# Patient Record
Sex: Female | Born: 1990
Health system: Southern US, Community
[De-identification: ages and names within clinical notes are randomized; demographics above are authoritative.]

## PROBLEM LIST (undated history)

## (undated) ENCOUNTER — Inpatient Hospital Stay (HOSPITAL_COMMUNITY): Payer: Self-pay

## (undated) DIAGNOSIS — E119 Type 2 diabetes mellitus without complications: Secondary | ICD-10-CM

## (undated) DIAGNOSIS — J45909 Unspecified asthma, uncomplicated: Secondary | ICD-10-CM

## (undated) DIAGNOSIS — Z8759 Personal history of other complications of pregnancy, childbirth and the puerperium: Secondary | ICD-10-CM

## (undated) DIAGNOSIS — N390 Urinary tract infection, site not specified: Secondary | ICD-10-CM

## (undated) DIAGNOSIS — L309 Dermatitis, unspecified: Secondary | ICD-10-CM

## (undated) DIAGNOSIS — T7840XA Allergy, unspecified, initial encounter: Secondary | ICD-10-CM

## (undated) DIAGNOSIS — D649 Anemia, unspecified: Secondary | ICD-10-CM

## (undated) DIAGNOSIS — I10 Essential (primary) hypertension: Secondary | ICD-10-CM

## (undated) HISTORY — DX: Allergy, unspecified, initial encounter: T78.40XA

## (undated) HISTORY — PX: NO PAST SURGERIES: SHX2092

## (undated) HISTORY — DX: Essential (primary) hypertension: I10

## (undated) HISTORY — DX: Personal history of other complications of pregnancy, childbirth and the puerperium: Z87.59

## (undated) HISTORY — DX: Unspecified asthma, uncomplicated: J45.909

## (undated) HISTORY — PX: OTHER SURGICAL HISTORY: SHX169

## (undated) HISTORY — DX: Anemia, unspecified: D64.9

## (undated) HISTORY — DX: Type 2 diabetes mellitus without complications: E11.9

---
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
## 2010-08-11 ENCOUNTER — Ambulatory Visit: Payer: Self-pay | Admitting: Gynecology

## 2010-08-18 ENCOUNTER — Ambulatory Visit: Payer: Self-pay | Admitting: Gynecology

## 2013-09-03 DIAGNOSIS — O10919 Unspecified pre-existing hypertension complicating pregnancy, unspecified trimester: Secondary | ICD-10-CM | POA: Diagnosis present

## 2013-09-03 DIAGNOSIS — I10 Essential (primary) hypertension: Secondary | ICD-10-CM | POA: Diagnosis present

## 2013-09-03 DIAGNOSIS — E119 Type 2 diabetes mellitus without complications: Secondary | ICD-10-CM

## 2013-09-03 DIAGNOSIS — D649 Anemia, unspecified: Secondary | ICD-10-CM

## 2013-09-03 HISTORY — DX: Anemia, unspecified: D64.9

## 2013-09-03 HISTORY — DX: Essential (primary) hypertension: I10

## 2013-09-03 HISTORY — DX: Type 2 diabetes mellitus without complications: E11.9

## 2013-11-06 ENCOUNTER — Ambulatory Visit (INDEPENDENT_AMBULATORY_CARE_PROVIDER_SITE_OTHER): Payer: No Typology Code available for payment source | Admitting: Medical

## 2013-11-06 ENCOUNTER — Encounter: Payer: Self-pay | Admitting: Medical

## 2013-11-06 VITALS — BP 148/98 | HR 64 | Temp 98.2°F | Resp 16 | Ht 64.0 in | Wt 212.0 lb

## 2013-11-06 DIAGNOSIS — I10 Essential (primary) hypertension: Secondary | ICD-10-CM

## 2013-11-06 DIAGNOSIS — R0989 Other specified symptoms and signs involving the circulatory and respiratory systems: Secondary | ICD-10-CM

## 2013-11-06 DIAGNOSIS — E669 Obesity, unspecified: Secondary | ICD-10-CM

## 2013-11-06 DIAGNOSIS — R0683 Snoring: Secondary | ICD-10-CM

## 2013-11-06 DIAGNOSIS — R0609 Other forms of dyspnea: Secondary | ICD-10-CM

## 2013-11-06 DIAGNOSIS — Z8249 Family history of ischemic heart disease and other diseases of the circulatory system: Secondary | ICD-10-CM

## 2013-11-06 LAB — LIPID PANEL
CHOL/HDL RATIO: 2.4 ratio
Cholesterol: 150 mg/dL (ref 0–200)
HDL: 62 mg/dL (ref >39)
LDL CALC: 79 mg/dL (ref 0–99)
TRIGLYCERIDES: 43 mg/dL (ref <150)
VLDL: 9 mg/dL (ref 0–40)

## 2013-11-06 LAB — CBC
HEMATOCRIT: 32.1 % — AB (ref 36.0–46.0)
Hemoglobin: 10.1 g/dL — ABNORMAL LOW (ref 12.0–15.0)
MCH: 27.2 pg (ref 26.0–34.0)
MCHC: 31.5 g/dL (ref 30.0–36.0)
MCV: 86.5 fL (ref 78.0–100.0)
PLATELETS: 374 10*3/uL (ref 150–400)
RBC: 3.71 MIL/uL — ABNORMAL LOW (ref 3.87–5.11)
RDW: 13.9 % (ref 11.5–15.5)
WBC: 3.7 10*3/uL — AB (ref 4.0–10.5)

## 2013-11-06 LAB — COMPREHENSIVE METABOLIC PANEL
ALK PHOS: 73 U/L (ref 39–117)
ALT: 17 U/L (ref 0–35)
AST: 18 U/L (ref 0–37)
Albumin: 4.4 g/dL (ref 3.5–5.2)
BILIRUBIN TOTAL: 0.4 mg/dL (ref 0.2–1.2)
BUN: 8 mg/dL (ref 6–23)
CO2: 25 meq/L (ref 19–32)
CREATININE: 0.77 mg/dL (ref 0.50–1.10)
Calcium: 9.1 mg/dL (ref 8.4–10.5)
Chloride: 101 mEq/L (ref 96–112)
Glucose, Bld: 210 mg/dL — ABNORMAL HIGH (ref 70–99)
Potassium: 4.3 mEq/L (ref 3.5–5.3)
SODIUM: 135 meq/L (ref 135–145)
TOTAL PROTEIN: 7 g/dL (ref 6.0–8.3)

## 2013-11-06 NOTE — Progress Notes (Signed)
   Subjective:   Misty [name] is a 23 y.o. female presenting on 11/06/2013 with Hypertension  New patient today.  Here for concern for high blood pressure.  Had been told even in teenage years that her pressure was high.  Was diagnosed by urgent care formally 29mo ago, but not started on medication.   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    Cooks most of her food at home, but does eat some fast food.  She uses neighbor's BP cuff.  Has checked her BP a few times and it has been high.   Highest she has seen 140/60.  She notes pains in her feet at times.   At night gets cramps or spasm in back of legs.  She snores, but no witnessed apena, no waking unrested or day time sleepiness.  No chest pain, no SOB, no leg swelling.   No syncope.   No other aggravating or relieving factors.  No other complaint.  Review of Systems ROS as in subjective      Objective:     Filed Vitals:   11/06/13 0853  BP: 148/98  Pulse: 64  Temp: 98.2 F (36.8 C)  Resp: 16    General appearance: alert, no distress, WD/WN, overweight AA female Neck: supple, no lymphadenopathy, no thyromegaly, no masses, no bruits Heart: RRR, normal S1, S2, no murmurs Lungs: CTA bilaterally, no wheezes, rhonchi, or rales Abdomen: +bs, soft, non tender, non distended, no masses, no hepatomegaly, no splenomegaly Pulses: 2+ symmetric, upper and lower extremities, normal cap refill Ext: no edema, nontender, negative homans MSK: legs nontender     Assessment: Encounter Diagnoses  Name Primary?  . Essential hypertension, benign Yes  . Obesity, unspecified   . Family history of heart disease   . Snoring      Plan: We discussed her prior elevator readings in diagnosis of bipolar pressure particularly at this age.  We will start with some lab tests to further evaluate her high blood pressure, consider sleep study going forward.  Recommendations as below:   Begin working on diet and exercise changes to lose  weight as this will help your blood pressure  Be careful with salt intake and eating out  prepare more of your meals at home  We will call Monday with lab results  Check your pharmacy over the weekend as I will likely send a medication for you to start for high blood pressure  Misty [name] was seen today for hypertension.  Diagnoses and associated orders for this visit:  Essential hypertension, benign - TSH - Lipid panel - Comprehensive metabolic panel - CBC  Obesity, unspecified - TSH - Lipid panel - Comprehensive metabolic panel - CBC  Family history of heart disease - TSH - Lipid panel - Comprehensive metabolic panel - CBC  Snoring - TSH - Lipid panel - Comprehensive metabolic panel - CBC     Return pending labs.

## 2013-11-06 NOTE — Patient Instructions (Signed)
Thank you for giving me the opportunity to serve you today.    Your diagnosis today includes: Encounter Diagnoses  Name Primary?  . Essential hypertension, benign Yes  . Obesity, unspecified   . Family history of heart disease   . Snoring      Specific recommendations today include:  Begin working on diet and exercise changes to lose weight as this will help your blood pressure  Be careful with salt intake and eating out  prepare more of your meals at home  We will call Monday with lab results  Check your pharmacy over the weekend as I will likely send a medication for you to start for high blood pressure  Follow up: pending labs   I have included other useful information below for your review.  Hypertension As your heart beats, it forces blood through your arteries. This force is your blood pressure. If the pressure is too high, it is called hypertension (HTN) or high blood pressure. HTN is dangerous because you may have it and not know it. High blood pressure may mean that your heart has to work harder to pump blood. Your arteries may be narrow or stiff. The extra work puts you at risk for heart disease, stroke, and other problems.  Blood pressure consists of two numbers, a higher number over a lower, 110/72, for example. It is stated as "110 over 72." The ideal is below 120 for the top number (systolic) and under 80 for the bottom (diastolic). Write down your blood pressure today. You should pay close attention to your blood pressure if you have certain conditions such as:  Heart failure.  Prior heart attack.  Diabetes  Chronic kidney disease.  Prior stroke.  Multiple risk factors for heart disease. To see if you have HTN, your blood pressure should be measured while you are seated with your arm held at the level of the heart. It should be measured at least twice. A one-time elevated blood pressure reading (especially in the Emergency Department) does not mean that  you need treatment. There may be conditions in which the blood pressure is different between your right and left arms. It is important to see your caregiver soon for a recheck. Most people have essential hypertension which means that there is not a specific cause. This type of high blood pressure may be lowered by changing lifestyle factors such as:  Stress.  Smoking.  Lack of exercise.  Excessive weight.  Drug/tobacco/alcohol use.  Eating less salt. Most people do not have symptoms from high blood pressure until it has caused damage to the body. Effective treatment can often prevent, delay or reduce that damage. TREATMENT  When a cause has been identified, treatment for high blood pressure is directed at the cause. There are a large number of medications to treat HTN. These fall into several categories, and your caregiver will help you select the medicines that are best for you. Medications may have side effects. You should review side effects with your caregiver. If your blood pressure stays high after you have made lifestyle changes or started on medicines,   Your medication(s) may need to be changed.  Other problems may need to be addressed.  Be certain you understand your prescriptions, and know how and when to take your medicine.  Be sure to follow up with your caregiver within the time frame advised (usually within two weeks) to have your blood pressure rechecked and to review your medications.  If you are taking  more than one medicine to lower your blood pressure, make sure you know how and at what times they should be taken. Taking two medicines at the same time can result in blood pressure that is too low. SEEK IMMEDIATE MEDICAL CARE IF:  You develop a severe headache, blurred or changing vision, or confusion.  You have unusual weakness or numbness, or a faint feeling.  You have severe chest or abdominal pain, vomiting, or breathing problems. MAKE SURE YOU:   Understand  these instructions.  Will watch your condition.  Will get help right away if you are not doing well or get worse. Document Released: 08/20/2005 Document Revised: 11/12/2011 Document Reviewed: 04/09/2008 [facility]Exit[facility] Patient Information 2014 [employer], [place].

## 2013-11-07 LAB — TSH: TSH: 2.28 u[IU]/mL (ref 0.350–4.500)

## 2013-11-09 ENCOUNTER — Telehealth: Payer: Self-pay | Admitting: Medical

## 2013-11-09 ENCOUNTER — Other Ambulatory Visit: Payer: Self-pay | Admitting: Medical

## 2013-11-09 MED ORDER — HYDROCHLOROTHIAZIDE 25 MG PO TABS: 25.0000 mg | ORAL_TABLET | Freq: Every day | ORAL | Status: DC

## 2013-11-09 NOTE — Telephone Encounter (Signed)
Pt was notified of results

## 2013-11-30 ENCOUNTER — Encounter: Payer: Self-pay | Admitting: Medical

## 2013-11-30 ENCOUNTER — Other Ambulatory Visit: Payer: Self-pay | Admitting: Medical

## 2013-11-30 ENCOUNTER — Ambulatory Visit (INDEPENDENT_AMBULATORY_CARE_PROVIDER_SITE_OTHER): Payer: No Typology Code available for payment source | Admitting: Medical

## 2013-11-30 VITALS — BP 138/78 | HR 72 | Temp 98.7°F | Resp 14 | Wt 208.0 lb

## 2013-11-30 DIAGNOSIS — I10 Essential (primary) hypertension: Secondary | ICD-10-CM

## 2013-11-30 DIAGNOSIS — R7301 Impaired fasting glucose: Secondary | ICD-10-CM

## 2013-11-30 DIAGNOSIS — Z23 Encounter for immunization: Secondary | ICD-10-CM

## 2013-11-30 DIAGNOSIS — D649 Anemia, unspecified: Secondary | ICD-10-CM

## 2013-11-30 LAB — HEMOGLOBIN A1C
Hgb A1c MFr Bld: 8.9 % — ABNORMAL HIGH (ref <5.7)
MEAN PLASMA GLUCOSE: 209 mg/dL — AB (ref <117)

## 2013-11-30 LAB — CBC WITH DIFFERENTIAL/PLATELET
Basophils Absolute: 0 10*3/uL (ref 0.0–0.1)
Basophils Relative: 0 % (ref 0–1)
Eosinophils Absolute: 0.2 10*3/uL (ref 0.0–0.7)
Eosinophils Relative: 2 % (ref 0–5)
HCT: 34.1 % — ABNORMAL LOW (ref 36.0–46.0)
Hemoglobin: 11.1 g/dL — ABNORMAL LOW (ref 12.0–15.0)
Lymphocytes Relative: 36 % (ref 12–46)
Lymphs Abs: 2.7 10*3/uL (ref 0.7–4.0)
MCH: 27.7 pg (ref 26.0–34.0)
MCHC: 32.6 g/dL (ref 30.0–36.0)
MCV: 85 fL (ref 78.0–100.0)
Monocytes Absolute: 0.5 10*3/uL (ref 0.1–1.0)
Monocytes Relative: 7 % (ref 3–12)
Neutro Abs: 4.2 10*3/uL (ref 1.7–7.7)
Neutrophils Relative %: 55 % (ref 43–77)
Platelets: 308 10*3/uL (ref 150–400)
RBC: 4.01 MIL/uL (ref 3.87–5.11)
RDW: 16.3 % — ABNORMAL HIGH (ref 11.5–15.5)
WBC: 7.6 10*3/uL (ref 4.0–10.5)

## 2013-11-30 LAB — BASIC METABOLIC PANEL
BUN: 8 mg/dL (ref 6–23)
CALCIUM: 9.1 mg/dL (ref 8.4–10.5)
CHLORIDE: 103 meq/L (ref 96–112)
CO2: 26 meq/L (ref 19–32)
CREATININE: 0.86 mg/dL (ref 0.50–1.10)
Glucose, Bld: 219 mg/dL — ABNORMAL HIGH (ref 70–99)
Potassium: 4 mEq/L (ref 3.5–5.3)
Sodium: 134 mEq/L — ABNORMAL LOW (ref 135–145)

## 2013-11-30 LAB — POCT URINALYSIS DIPSTICK
BILIRUBIN UA: NEGATIVE
GLUCOSE UA: 100
Ketones, UA: NEGATIVE
LEUKOCYTES UA: NEGATIVE
NITRITE UA: NEGATIVE
PH UA: 5
Protein, UA: NEGATIVE
Spec Grav, UA: 1.02
Urobilinogen, UA: NEGATIVE

## 2013-11-30 LAB — RETICULOCYTES
ABS Retic: 48.1 10*3/uL (ref 19.0–186.0)
RBC.: 4.01 MIL/uL (ref 3.87–5.11)
RETIC CT PCT: 1.2 % (ref 0.4–2.3)

## 2013-11-30 LAB — IBC PANEL
%SAT: 8 % — ABNORMAL LOW (ref 20–55)
TIBC: 409 ug/dL (ref 250–470)
UIBC: 376 ug/dL (ref 125–400)

## 2013-11-30 LAB — IRON: IRON: 33 ug/dL — AB (ref 42–145)

## 2013-11-30 MED ORDER — HYDROCHLOROTHIAZIDE 25 MG PO TABS: 25.0000 mg | ORAL_TABLET | Freq: Every day | ORAL | Status: DC

## 2013-11-30 NOTE — Addendum Note (Signed)
Addended by: Janeice RobinsonSCALES, Kailena Lubas L on: 11/30/2013 04:15 PM   Modules accepted: Orders

## 2013-11-30 NOTE — Progress Notes (Signed)
   Subjective:   Misty [name] is a 23 y.o. female presenting on 11/30/2013 with discuss BP, anemia and glucose  I saw her recently as a new patient, we started blood pressure medicine and did labs. She is here to followup on abnormal labs . She has started the HCTZ without complaint.  Is planning to start working out at the gym with her friends.  She denies any history of anemia, no current bleeding, periods are regular and not heavy, no obvious hemorrhoids, and does not feel fatigued  She does note mother and grandmother have diabetes. She was fasting last visit and her glucose came back at 200. Her last meal today was about 2 hours and 45 minutes ago. She denies polydipsia, polyuria, blurred vision or other diabetic symptoms.  No other complaint.  Review of Systems ROS as in subjective      Objective:     Filed Vitals:   11/30/13 1511  BP: 138/78  Pulse: 72  Temp: 98.7 F (37.1 C)  Resp: 14    General appearance: alert, no distress, WD/WN Neck: supple, no lymphadenopathy, no thyromegaly, no masses Heart: RRR, normal S1, S2, no murmurs Lungs: CTA bilaterally, no wheezes, rhonchi, or rales Pulses: 2+ symmetric, upper and lower extremities, normal cap refill Ext: no edema      Assessment: Encounter Diagnoses  Name Primary?  Marland Kitchen. Anemia Yes  . Essential hypertension, benign   . Impaired fasting blood sugar   . Need for influenza vaccination   . Need for prophylactic vaccination and inoculation against influenza      Plan: We discussed her recent abnormal lab findings. Additional labs today. Continue current blood pressure medicine started last visit. Continue lifestyle changes and weight loss efforts. Followup pending labs  Misty [name] was seen today for discuss bp, anemia and glucose.  Diagnoses and associated orders for this visit:  Anemia - Basic metabolic panel - CBC with Differential - Reticulocytes - IBC panel - Hemoglobin A1c  Essential  hypertension, benign - Basic metabolic panel - CBC with Differential - Reticulocytes - IBC panel - Hemoglobin A1c  Impaired fasting blood sugar - Basic metabolic panel - CBC with Differential - Reticulocytes - IBC panel - Hemoglobin A1c  Need for influenza vaccination - Basic metabolic panel - CBC with Differential - Reticulocytes - IBC panel - Hemoglobin A1c  Need for prophylactic vaccination and inoculation against influenza  Other Orders - hydrochlorothiazide (HYDRODIURIL) 25 MG tablet; Take 1 tablet (25 mg total) by mouth daily.    Return pending labs.

## 2013-12-01 LAB — INSULIN, RANDOM: INSULIN: 23 u[IU]/mL (ref 3–28)

## 2013-12-02 ENCOUNTER — Other Ambulatory Visit: Payer: Self-pay | Admitting: Medical

## 2013-12-02 DIAGNOSIS — I10 Essential (primary) hypertension: Secondary | ICD-10-CM

## 2013-12-02 DIAGNOSIS — D649 Anemia, unspecified: Secondary | ICD-10-CM

## 2013-12-02 DIAGNOSIS — E1165 Type 2 diabetes mellitus with hyperglycemia: Principal | ICD-10-CM

## 2013-12-02 DIAGNOSIS — IMO0001 Reserved for inherently not codable concepts without codable children: Secondary | ICD-10-CM

## 2013-12-08 ENCOUNTER — Other Ambulatory Visit: Payer: Self-pay | Admitting: Family Medicine

## 2013-12-08 DIAGNOSIS — E669 Obesity, unspecified: Secondary | ICD-10-CM

## 2013-12-08 DIAGNOSIS — I10 Essential (primary) hypertension: Secondary | ICD-10-CM

## 2013-12-08 DIAGNOSIS — R7301 Impaired fasting glucose: Secondary | ICD-10-CM

## 2013-12-08 DIAGNOSIS — D649 Anemia, unspecified: Secondary | ICD-10-CM

## 2013-12-09 ENCOUNTER — Institutional Professional Consult (permissible substitution): Payer: No Typology Code available for payment source | Admitting: Medical

## 2013-12-11 ENCOUNTER — Ambulatory Visit (INDEPENDENT_AMBULATORY_CARE_PROVIDER_SITE_OTHER): Payer: No Typology Code available for payment source | Admitting: Medical

## 2013-12-11 ENCOUNTER — Encounter: Payer: Self-pay | Admitting: Medical

## 2013-12-11 VITALS — BP 122/86 | HR 84 | Temp 98.3°F | Resp 16 | Ht 64.0 in | Wt 208.0 lb

## 2013-12-11 DIAGNOSIS — I10 Essential (primary) hypertension: Secondary | ICD-10-CM

## 2013-12-11 DIAGNOSIS — Z111 Encounter for screening for respiratory tuberculosis: Secondary | ICD-10-CM

## 2013-12-11 DIAGNOSIS — E119 Type 2 diabetes mellitus without complications: Secondary | ICD-10-CM

## 2013-12-11 DIAGNOSIS — D649 Anemia, unspecified: Secondary | ICD-10-CM

## 2013-12-11 MED ORDER — HYDROCHLOROTHIAZIDE 25 MG PO TABS: 25.0000 mg | ORAL_TABLET | Freq: Every day | ORAL | Status: DC

## 2013-12-11 NOTE — Patient Instructions (Signed)
Thank you for giving me the opportunity to serve you today.    Your diagnosis today includes: Encounter Diagnoses  Name Primary?  . Newly diagnosed diabetes Yes  . Essential hypertension, benign   . Anemia   . Screening examination for pulmonary tuberculosis      Specific recommendations today include:  Unfortunately your blood work shows that you have anemia or low blood count, new diagnosis of diabetes, new diagnosis of high blood pressure   Given your young age and these new diagnoses, we are checking some additional things including evaluation of the kidneys  Thus we did some additional blood work today, and you'll go and have your ultrasound of the kidneys done next week  Continue your blood pressure medication as usual  Start checking her blood sugars daily, but check at different times of the day before meals, and write these numbers down  You do not have to check 3 times a day, just once a day at different times of the day  Used the glucose log sheet that I gave you  We will call next week with lab results and recommendations  Return     I have included other useful information below for your review.  Type 2 Diabetes Mellitus, Adult Type 2 diabetes mellitus, often simply referred to as type 2 diabetes, is a long-lasting (chronic) disease. In type 2 diabetes, the pancreas does not make enough insulin (a hormone), the cells are less responsive to the insulin that is made (insulin resistance), or both. Normally, insulin moves sugars from food into the tissue cells. The tissue cells use the sugars for energy. The lack of insulin or the lack of normal response to insulin causes excess sugars to build up in the blood instead of going into the tissue cells. As a result, high blood sugar (hyperglycemia) develops. The effect of high sugar (glucose) levels can cause many complications. Type 2 diabetes was also previously called adult-onset diabetes but it can occur at any  age.  RISK FACTORS  A person is predisposed to developing type 2 diabetes if someone in the family has the disease and also has one or more of the following primary risk factors:  Overweight.  An inactive lifestyle.  A history of consistently eating high-calorie foods. Maintaining a normal weight and regular physical activity can reduce the chance of developing type 2 diabetes. SYMPTOMS  A person with type 2 diabetes may not show symptoms initially. The symptoms of type 2 diabetes appear slowly. The symptoms include:  Increased thirst (polydipsia).  Increased urination (polyuria).  Increased urination during the night (nocturia).  Weight loss. This weight loss may be rapid.  Frequent, recurring infections.  Tiredness (fatigue).  Weakness.  Vision changes, such as blurred vision.  Fruity smell to your breath.  Abdominal pain.  Nausea or vomiting.  Cuts or bruises which are slow to heal.  Tingling or numbness in the hands or feet. DIAGNOSIS Type 2 diabetes is frequently not diagnosed until complications of diabetes are present. Type 2 diabetes is diagnosed when symptoms or complications are present and when blood glucose levels are increased. Your blood glucose level may be checked by one or more of the following blood tests:  A fasting blood glucose test. You will not be allowed to eat for at least 8 hours before a blood sample is taken.  A random blood glucose test. Your blood glucose is checked at any time of the day regardless of when you ate.  A hemoglobin A1c  blood glucose test. A hemoglobin A1c test provides information about blood glucose control over the previous 3 months.  An oral glucose tolerance test (OGTT). Your blood glucose is measured after you have not eaten (fasted) for 2 hours and then after you drink a glucose-containing beverage. TREATMENT   You may need to take insulin or diabetes medicine daily to keep blood glucose levels in the desired  range.  You will need to match insulin dosing with exercise and healthy food choices. The treatment goal is to maintain the before meal blood sugar (preprandial glucose) level at 70 130 mg/dL. HOME CARE INSTRUCTIONS   Have your hemoglobin A1c level checked twice a year.  Perform daily blood glucose monitoring as directed by your caregiver.  Monitor urine ketones when you are ill and as directed by your caregiver.  Take your diabetes medicine or insulin as directed by your caregiver to maintain your blood glucose levels in the desired range.  Never run out of diabetes medicine or insulin. It is needed every day.  Adjust insulin based on your intake of carbohydrates. Carbohydrates can raise blood glucose levels but need to be included in your diet. Carbohydrates provide vitamins, minerals, and fiber which are an essential part of a healthy diet. Carbohydrates are found in fruits, vegetables, whole grains, dairy products, legumes, and foods containing added sugars.    Eat healthy foods. Alternate 3 meals with 3 snacks.  Lose weight if overweight.  Carry a medical alert card or wear your medical alert jewelry.  Carry a 15 gram carbohydrate snack with you at all times to treat low blood glucose (hypoglycemia). Some examples of 15 gram carbohydrate snacks include:  Glucose tablets, 3 or 4   Glucose gel, 15 gram tube  Raisins, 2 tablespoons (24 grams)  Jelly beans, 6  Animal crackers, 8  Regular pop, 4 ounces (120 mL)  Gummy treats, 9  Recognize hypoglycemia. Hypoglycemia occurs with blood glucose levels of 70 mg/dL and below. The risk for hypoglycemia increases when fasting or skipping meals, during or after intense exercise, and during sleep. Hypoglycemia symptoms can include:  Tremors or shakes.  Decreased ability to concentrate.  Sweating.  Increased heart rate.  Headache.  Dry mouth.  Hunger.  Irritability.  Anxiety.  Restless sleep.  Altered speech or  coordination.  Confusion.  Treat hypoglycemia promptly. If you are alert and able to safely swallow, follow the 15:15 rule:  Take 15 20 grams of rapid-acting glucose or carbohydrate. Rapid-acting options include glucose gel, glucose tablets, or 4 ounces (120 mL) of fruit juice, regular soda, or low fat milk.  Check your blood glucose level 15 minutes after taking the glucose.  Take 15 20 grams more of glucose if the repeat blood glucose level is still 70 mg/dL or below.  Eat a meal or snack within 1 hour once blood glucose levels return to normal.    Be alert to polyuria and polydipsia which are early signs of hyperglycemia. An early awareness of hyperglycemia allows for prompt treatment. Treat hyperglycemia as directed by your caregiver.  Engage in at least 150 minutes of moderate-intensity physical activity a week, spread over at least 3 days of the week or as directed by your caregiver. In addition, you should engage in resistance exercise at least 2 times a week or as directed by your caregiver.  Adjust your medicine and food intake as needed if you start a new exercise or sport.  Follow your sick day plan at any time you are  unable to eat or drink as usual.  Avoid tobacco use.  Limit alcohol intake to no more than 1 drink per day for nonpregnant women and 2 drinks per day for men. You should drink alcohol only when you are also eating food. Talk with your caregiver whether alcohol is safe for you. Tell your caregiver if you drink alcohol several times a week.  Follow up with your caregiver regularly.  Schedule an eye exam soon after the diagnosis of type 2 diabetes and then annually.  Perform daily skin and foot care. Examine your skin and feet daily for cuts, bruises, redness, nail problems, bleeding, blisters, or sores. A foot exam by a caregiver should be done annually.  Brush your teeth and gums at least twice a day and floss at least once a day. Follow up with your  dentist regularly.  Share your diabetes management plan with your workplace or school.  Stay up-to-date with immunizations.  Learn to manage stress.  Obtain ongoing diabetes education and support as needed.  Participate in, or seek rehabilitation as needed to maintain or improve independence and quality of life. Request a physical or occupational therapy referral if you are having foot or hand numbness or difficulties with grooming, dressing, eating, or physical activity. SEEK MEDICAL CARE IF:   You are unable to eat food or drink fluids for more than 6 hours.  You have nausea and vomiting for more than 6 hours.  Your blood glucose level is over 240 mg/dL.  There is a change in mental status.  You develop an additional serious illness.  You have diarrhea for more than 6 hours.  You have been sick or have had a fever for a couple of days and are not getting better.  You have pain during any physical activity.  SEEK IMMEDIATE MEDICAL CARE IF:  You have difficulty breathing.  You have moderate to large ketone levels. MAKE SURE YOU:  Understand these instructions.  Will watch your condition.  Will get help right away if you are not doing well or get worse. Document Released: 08/20/2005 Document Revised: 05/14/2012 Document Reviewed: 03/18/2012 [facility] Patient Information 2014 [place], [place].    Hypertension As your heart beats, it forces blood through your arteries. This force is your blood pressure. If the pressure is too high, it is called hypertension (HTN) or high blood pressure. HTN is dangerous because you may have it and not know it. High blood pressure may mean that your heart has to work harder to pump blood. Your arteries may be narrow or stiff. The extra work puts you at risk for heart disease, stroke, and other problems.  Blood pressure consists of two numbers, a higher number over a lower, 110/72, for example. It is stated as "110 over 72." The ideal is  below 120 for the top number (systolic) and under 80 for the bottom (diastolic). Write down your blood pressure today. You should pay close attention to your blood pressure if you have certain conditions such as:  Heart failure.  Prior heart attack.  Diabetes  Chronic kidney disease.  Prior stroke.  Multiple risk factors for heart disease. To see if you have HTN, your blood pressure should be measured while you are seated with your arm held at the level of the heart. It should be measured at least twice. A one-time elevated blood pressure reading (especially in the Emergency Department) does not mean that you need treatment. There may be conditions in which the blood pressure is  different between your right and left arms. It is important to see your caregiver soon for a recheck. Most people have essential hypertension which means that there is not a specific cause. This type of high blood pressure may be lowered by changing lifestyle factors such as:  Stress.  Smoking.  Lack of exercise.  Excessive weight.  Drug/tobacco/alcohol use.  Eating less salt. Most people do not have symptoms from high blood pressure until it has caused damage to the body. Effective treatment can often prevent, delay or reduce that damage. TREATMENT  When a cause has been identified, treatment for high blood pressure is directed at the cause. There are a large number of medications to treat HTN. These fall into several categories, and your caregiver will help you select the medicines that are best for you. Medications may have side effects. You should review side effects with your caregiver. If your blood pressure stays high after you have made lifestyle changes or started on medicines,   Your medication(s) may need to be changed.  Other problems may need to be addressed.  Be certain you understand your prescriptions, and know how and when to take your medicine.  Be sure to follow up with your caregiver  within the time frame advised (usually within two weeks) to have your blood pressure rechecked and to review your medications.  If you are taking more than one medicine to lower your blood pressure, make sure you know how and at what times they should be taken. Taking two medicines at the same time can result in blood pressure that is too low. SEEK IMMEDIATE MEDICAL CARE IF:  You develop a severe headache, blurred or changing vision, or confusion.  You have unusual weakness or numbness, or a faint feeling.  You have severe chest or abdominal pain, vomiting, or breathing problems. MAKE SURE YOU:   Understand these instructions.  Will watch your condition.  Will get help right away if you are not doing well or get worse. Document Released: 08/20/2005 Document Revised: 11/12/2011 Document Reviewed: 04/09/2008 [facility] Patient Information 2014 [place], [place].   Anemia, Nonspecific Anemia is a condition in which the concentration of red blood cells or hemoglobin in the blood is below normal. Hemoglobin is a substance in red blood cells that carries oxygen to the tissues of the body. Anemia results in not enough oxygen reaching these tissues.  CAUSES  Common causes of anemia include:   Excessive bleeding. Bleeding may be internal or external. This includes excessive bleeding from periods (in women) or from the intestine.   Poor nutrition.   Chronic kidney, thyroid, and liver disease.  Bone marrow disorders that decrease red blood cell production.  Cancer and treatments for cancer.  HIV, AIDS, and their treatments.  Spleen problems that increase red blood cell destruction.  Blood disorders.  Excess destruction of red blood cells due to infection, medicines, and autoimmune disorders. SIGNS AND SYMPTOMS   Minor weakness.   Dizziness.   Headache.  Palpitations.   Shortness of breath, especially with exercise.   Paleness.  Cold  sensitivity.  Indigestion.  Nausea.  Difficulty sleeping.  Difficulty concentrating. Symptoms may occur suddenly or they may develop slowly.  DIAGNOSIS  Additional blood tests are often needed. These help your health care provider determine the best treatment. Your health care provider will check your stool for blood and look for other causes of blood loss.  TREATMENT  Treatment varies depending on the cause of the anemia. Treatment can include:  Supplements of iron, vitamin B12, or folic acid.   Hormone medicines.   A blood transfusion. This may be needed if blood loss is severe.   Hospitalization. This may be needed if there is significant continual blood loss.   Dietary changes.  Spleen removal. HOME CARE INSTRUCTIONS Keep all follow-up appointments. It often takes many weeks to correct anemia, and having your health care provider check on your condition and your response to treatment is very important. SEEK IMMEDIATE MEDICAL CARE IF:   You develop extreme weakness, shortness of breath, or chest pain.   You become dizzy or have trouble concentrating.  You develop heavy vaginal bleeding.   You develop a rash.   You have bloody or black, tarry stools.   You faint.   You vomit up blood.   You vomit repeatedly.   You have abdominal pain.  You have a fever or persistent symptoms for more than 2 3 days.   You have a fever and your symptoms suddenly get worse.   You are dehydrated.  MAKE SURE YOU:  Understand these instructions.  Will watch your condition.  Will get help right away if you are not doing well or get worse. Document Released: 09/27/2004 Document Revised: 04/22/2013 Document Reviewed: 02/13/2013 [facility] Patient Information 2014 [place]

## 2013-12-11 NOTE — Progress Notes (Signed)
   Subjective:   Francena HanlyKatelin I Gorelik is a 23 y.o. female presenting on 12/11/2013 with Follow-up  I recently started seeing her for her concerns about blood pressure, and she currently is doing well her new blood pressure medication.  However at her followup visit we did some lab work which unfortunately showed new diagnosis of diabetes and anemia with some low iron.  She denies any current bleeding or bruising, periods are regular and not heavy at all, no history of anemia, no history of elevated sugar. Her only recent symptoms are fatigue and ice craving.  She is here to discuss labs and next steps.  She also needs a TB skin test because she works in [employer]. No other aggravating or relieving factors.  No other complaint.  Review of Systems ROS as in subjective      Objective:    Filed Vitals:   12/11/13 1400  BP: 122/86  Pulse: 84  Temp: 98.3 F (36.8 C)  Resp: 16    General appearance: alert, no distress, WD/WN Heart: RRR, normal S1, S2, no murmurs Lungs: CTA bilaterally, no wheezes, rhonchi, or rales Pulses: 2+ symmetric, upper and lower extremities, normal cap refill      Assessment: Encounter Diagnoses  Name Primary?  . Newly diagnosed diabetes Yes  . Essential hypertension, benign   . Anemia   . Screening examination for pulmonary tuberculosis      Plan: We discussed her collection of recent new diagnoses and lab findings .  Unfortunately in the short time I have known her, she seems to have new diagnoses of high blood pressure, diabetes, anemia with some low iron.  Her only associated symptoms is fatigue and ice craving.  she is doing well in her new blood pressure medicine, blood pressure controlled.  We discussed the newer findings of diabetes along with mild anemia. After speaking to supervising physician Dr. Susann GivensLalonde an endocrinologist Ardyth HarpsSteve South M.D., we are checking some additional things including C-peptide, microalbumin, renal ultrasound.  I had my nurse  show her how to use a glucometer today, and I will have her start checking some sugars  Pending results next week we will start her on medication for diabetes.  So far labs would suggest that she is type II, but we will check a C-peptide to help further determine.  We also need to rule out renal disease.  I answered her questions, gave a handout on the things we discussed   Return Monday for PPD reading.  Rockne CoonsKatelin was seen today for follow-up.  Diagnoses and associated orders for this visit:  Newly diagnosed diabetes - C-peptide - Microalbumin/Creatinine Ratio, Urine  Essential hypertension, benign - C-peptide  Anemia  Screening examination for pulmonary tuberculosis - PPD  Other Orders - hydrochlorothiazide (HYDRODIURIL) 25 MG tablet; Take 1 tablet (25 mg total) by mouth daily.    Return pendign labs.

## 2013-12-12 LAB — MICROALBUMIN / CREATININE URINE RATIO
Creatinine, Urine: 91.5 mg/dL
Microalb Creat Ratio: 5.5 mg/g (ref 0.0–30.0)
Microalb, Ur: 0.5 mg/dL (ref 0.00–1.89)

## 2013-12-12 LAB — C-PEPTIDE: C PEPTIDE: 4.63 ng/mL — AB (ref 0.80–3.90)

## 2013-12-15 ENCOUNTER — Other Ambulatory Visit: Payer: Self-pay | Admitting: Medical

## 2013-12-15 ENCOUNTER — Other Ambulatory Visit: Payer: Self-pay

## 2013-12-15 MED ORDER — FERROUS GLUCONATE 324 (38 FE) MG PO TABS: 324.0000 mg | ORAL_TABLET | Freq: Two times a day (BID) | ORAL | Status: DC

## 2013-12-15 MED ORDER — CANAGLIFLOZIN-METFORMIN HCL 50-500 MG PO TABS: 1.0000 | ORAL_TABLET | Freq: Two times a day (BID) | ORAL | Status: DC

## 2013-12-24 ENCOUNTER — Other Ambulatory Visit: Payer: Self-pay

## 2013-12-31 ENCOUNTER — Other Ambulatory Visit: Payer: Self-pay

## 2014-01-06 ENCOUNTER — Other Ambulatory Visit: Payer: Self-pay | Admitting: Family Medicine

## 2014-01-06 ENCOUNTER — Telehealth: Payer: Self-pay | Admitting: Internal Medicine

## 2014-01-06 NOTE — Telephone Encounter (Signed)
Pt called and states she needs a refill on her needles and test strips. She test 2 times daily but does not know the name of her meter and will call tomorrow with that. Send to [facility] [place].   [facility] called and states that pt rescheuled her ultrasound to 5/22

## 2014-01-06 NOTE — Telephone Encounter (Signed)
Patients telephone number does not work. CLS

## 2014-01-07 ENCOUNTER — Ambulatory Visit: Payer: No Typology Code available for payment source | Admitting: Gynecology

## 2014-01-07 ENCOUNTER — Telehealth: Payer: Self-pay | Admitting: Medical

## 2014-01-07 NOTE — Telephone Encounter (Signed)
This one belongs to you

## 2014-01-07 NOTE — Telephone Encounter (Signed)
Called glucose testing supplies/strips uses BID #[id number] RF per Vincenza HewsShane

## 2014-01-07 NOTE — Telephone Encounter (Signed)
pls call out test strips and testing supplies, checks BID, #100, 11 refills

## 2014-01-07 NOTE — Telephone Encounter (Signed)
Called testing supplies for patient

## 2014-01-08 ENCOUNTER — Other Ambulatory Visit: Payer: Self-pay

## 2014-01-08 ENCOUNTER — Other Ambulatory Visit: Payer: No Typology Code available for payment source

## 2014-01-13 ENCOUNTER — Other Ambulatory Visit: Payer: No Typology Code available for payment source

## 2014-01-20 ENCOUNTER — Encounter (HOSPITAL_COMMUNITY): Payer: Self-pay | Admitting: *Deleted

## 2014-01-20 ENCOUNTER — Inpatient Hospital Stay (HOSPITAL_COMMUNITY)
Admission: AD | Admit: 2014-01-20 | Discharge: 2014-01-20 | Disposition: A | Discharge status: Home / Self Care | Payer: No Typology Code available for payment source | Source: Ambulatory Visit | Attending: Obstetrics & Gynecology | Admitting: Obstetrics & Gynecology

## 2014-01-20 DIAGNOSIS — E119 Type 2 diabetes mellitus without complications: Secondary | ICD-10-CM | POA: Insufficient documentation

## 2014-01-20 DIAGNOSIS — A084 Viral intestinal infection, unspecified: Secondary | ICD-10-CM

## 2014-01-20 DIAGNOSIS — A088 Other specified intestinal infections: Secondary | ICD-10-CM

## 2014-01-20 DIAGNOSIS — K5289 Other specified noninfective gastroenteritis and colitis: Secondary | ICD-10-CM | POA: Insufficient documentation

## 2014-01-20 DIAGNOSIS — E860 Dehydration: Secondary | ICD-10-CM | POA: Insufficient documentation

## 2014-01-20 DIAGNOSIS — I10 Essential (primary) hypertension: Secondary | ICD-10-CM | POA: Insufficient documentation

## 2014-01-20 DIAGNOSIS — R112 Nausea with vomiting, unspecified: Secondary | ICD-10-CM | POA: Insufficient documentation

## 2014-01-20 DIAGNOSIS — Z87891 Personal history of nicotine dependence: Secondary | ICD-10-CM | POA: Insufficient documentation

## 2014-01-20 DIAGNOSIS — R197 Diarrhea, unspecified: Secondary | ICD-10-CM | POA: Insufficient documentation

## 2014-01-20 HISTORY — DX: Dermatitis, unspecified: L30.9

## 2014-01-20 LAB — CBC WITH DIFFERENTIAL/PLATELET
Basophils Absolute: 0 10*3/uL (ref 0.0–0.1)
Basophils Relative: 0 % (ref 0–1)
EOS ABS: 0 10*3/uL (ref 0.0–0.7)
EOS PCT: 0 % (ref 0–5)
HCT: 36.9 % (ref 36.0–46.0)
HEMOGLOBIN: 12.5 g/dL (ref 12.0–15.0)
LYMPHS ABS: 0.9 10*3/uL (ref 0.7–4.0)
Lymphocytes Relative: 13 % (ref 12–46)
MCH: 29.6 pg (ref 26.0–34.0)
MCHC: 33.9 g/dL (ref 30.0–36.0)
MCV: 87.4 fL (ref 78.0–100.0)
MONO ABS: 0.6 10*3/uL (ref 0.1–1.0)
MONOS PCT: 9 % (ref 3–12)
Neutro Abs: 5 10*3/uL (ref 1.7–7.7)
Neutrophils Relative %: 78 % — ABNORMAL HIGH (ref 43–77)
Platelets: 279 10*3/uL (ref 150–400)
RBC: 4.22 MIL/uL (ref 3.87–5.11)
RDW: 17.5 % — ABNORMAL HIGH (ref 11.5–15.5)
WBC: 6.6 10*3/uL (ref 4.0–10.5)

## 2014-01-20 LAB — COMPREHENSIVE METABOLIC PANEL
ALBUMIN: 3.5 g/dL (ref 3.5–5.2)
ALT: 27 U/L (ref 0–35)
AST: 22 U/L (ref 0–37)
Alkaline Phosphatase: 63 U/L (ref 39–117)
BILIRUBIN TOTAL: 0.6 mg/dL (ref 0.3–1.2)
BUN: 7 mg/dL (ref 6–23)
CALCIUM: 8.8 mg/dL (ref 8.4–10.5)
CHLORIDE: 97 meq/L (ref 96–112)
CO2: 26 mEq/L (ref 19–32)
CREATININE: 0.83 mg/dL (ref 0.50–1.10)
GFR calc Af Amer: >90 mL/min (ref >90)
GFR calc non Af Amer: >90 mL/min (ref >90)
Glucose, Bld: 170 mg/dL — ABNORMAL HIGH (ref 70–99)
Potassium: 3.2 mEq/L — ABNORMAL LOW (ref 3.7–5.3)
Sodium: 136 mEq/L — ABNORMAL LOW (ref 137–147)
Total Protein: 6.6 g/dL (ref 6.0–8.3)

## 2014-01-20 LAB — URINALYSIS, ROUTINE W REFLEX MICROSCOPIC
Bilirubin Urine: NEGATIVE
HGB URINE DIPSTICK: NEGATIVE
KETONES UR: 15 mg/dL — AB
LEUKOCYTES UA: NEGATIVE
Nitrite: NEGATIVE
PH: 6 (ref 5.0–8.0)
Protein, ur: NEGATIVE mg/dL
Specific Gravity, Urine: 1.02 (ref 1.005–1.030)
Urobilinogen, UA: 0.2 mg/dL (ref 0.0–1.0)

## 2014-01-20 LAB — URINE MICROSCOPIC-ADD ON

## 2014-01-20 LAB — POCT PREGNANCY, URINE: Preg Test, Ur: NEGATIVE

## 2014-01-20 LAB — GLUCOSE, CAPILLARY: GLUCOSE-CAPILLARY: 163 mg/dL — AB (ref 70–99)

## 2014-01-20 MED ORDER — ACETAMINOPHEN 500 MG PO TABS
1000.0000 mg | ORAL_TABLET | Freq: Once | ORAL | Status: AC
  Administered 2014-01-20: 1000 mg via ORAL
  Filled 2014-01-20: qty 2

## 2014-01-20 MED ORDER — PROMETHAZINE HCL 25 MG/ML IJ SOLN
25.0000 mg | INTRAVENOUS | Status: DC
  Administered 2014-01-20: 25 mg via INTRAVENOUS
  Filled 2014-01-20: qty 1

## 2014-01-20 NOTE — MAU Note (Addendum)
dr is Dr Crosby Oysteravid Tysinger (thinks he is endocrinologist). Takes oral medication for diabetis (BID) -last dose 05/19 p.m.  Diagnosed this Spring

## 2014-01-20 NOTE — MAU Note (Signed)
Pt reports nausea and vomiting since last night. Pt does not think she is pregnant because she had a period at the end of April.

## 2014-01-20 NOTE — MAU Provider Note (Signed)
CC: Emesis and Diarrhea   HPI Misty [name] is a 23 y.o. G0P0 who presents with onset last night of nausea, vomiting several times and frequent diarrhea. Symptoms much abated since 11AM. No fever/chills. Sick contacts - children at [employer] where she works have N/V/D. No treatment. Missed work. Vomited Metformin and did not eat today. CBGs usually 150s-170s. LMP 12/26/13.  PCP Dr. Aleen Campiysinger  Past Medical History  Diagnosis Date  . Allergy   . Diabetes mellitus without complication 2015  . Anemia 2015  . Hypertension 09/2013  . Asthma     as child  . Eczema     OB History  Gravida Para Term Preterm AB SAB TAB Ectopic Multiple Living  0                 Past Surgical History  Procedure Laterality Date  . No past surgeries      History   Social History  . Marital Status: Single    Spouse Name: N/A    Number of Children: N/A  . Years of Education: N/A   Occupational History  . Not on file.   Social History Main Topics  . Smoking status: Former Smoker -- 0.50 packs/day for 2 years    Quit date: 09/08/2013  . Smokeless tobacco: Never Used  . Alcohol Use: No  . Drug Use: No  . Sexual Activity: Yes    Birth Control/ Protection: None   Other Topics Concern  . Not on file   Social History Narrative   Works at [employer]    No current facility-administered medications on file prior to encounter.   Current Outpatient Prescriptions on File Prior to Encounter  Medication Sig Dispense Refill  . Canagliflozin-Metformin HCl (INVOKAMET) 50-500 MG TABS Take 1 tablet by mouth 2 (two) times daily.  60 tablet  5  . ferrous gluconate (FERGON) 324 MG tablet Take 1 tablet (324 mg total) by mouth 2 (two) times daily with a meal.  60 tablet  5  . hydrochlorothiazide (HYDRODIURIL) 25 MG tablet Take 1 tablet (25 mg total) by mouth daily.  30 tablet  5    Allergies  Allergen Reactions  . Iodides Anaphylaxis and Itching  . Morphine And Related Anaphylaxis and Itching  . Shellfish  Allergy Anaphylaxis and Itching    Pt reports "watery eyes"    ROS Pertinent items in HPI  PHYSICAL EXAM Filed Vitals:   01/20/14 1346  BP: 135/77  Pulse: 93  Temp: 100.1 F (37.8 C)  Resp: 18   General: Well nourished, well developed female in no acute distress Cardiovascular: Normal rate Respiratory: Normal effort Abdomen: Soft, nontender, BS normal Back: No CVAT Extremities: No edema Neurologic: Alert and oriented   LAB RESULTS Results for orders placed during the hospital encounter of 01/20/14 (from the past 24 hour(s))  URINALYSIS, ROUTINE W REFLEX MICROSCOPIC     Status: Abnormal   Collection Time    01/20/14  1:28 PM      Result Value Ref Range   Color, Urine YELLOW  YELLOW   APPearance CLEAR  CLEAR   Specific Gravity, Urine 1.020  1.005 - 1.030   pH 6.0  5.0 - 8.0   Glucose, UA >1000 (*) NEGATIVE mg/dL   Hgb urine dipstick NEGATIVE  NEGATIVE   Bilirubin Urine NEGATIVE  NEGATIVE   Ketones, ur 15 (*) NEGATIVE mg/dL   Protein, ur NEGATIVE  NEGATIVE mg/dL   Urobilinogen, UA 0.2  0.0 - 1.0 mg/dL  Nitrite NEGATIVE  NEGATIVE   Leukocytes, UA NEGATIVE  NEGATIVE  URINE MICROSCOPIC-ADD ON     Status: None   Collection Time    01/20/14  1:28 PM      Result Value Ref Range   Squamous Epithelial / LPF RARE  RARE  POCT PREGNANCY, URINE     Status: None   Collection Time    01/20/14  1:55 PM      Result Value Ref Range   Preg Test, Ur NEGATIVE  NEGATIVE  GLUCOSE, CAPILLARY     Status: Abnormal   Collection Time    01/20/14  2:33 PM      Result Value Ref Range   Glucose-Capillary 163 (*) 70 - 99 mg/dL   Comment 1 Notify RN    CBC WITH DIFFERENTIAL     Status: Abnormal   Collection Time    01/20/14  3:05 PM      Result Value Ref Range   WBC 6.6  4.0 - 10.5 K/uL   RBC 4.22  3.87 - 5.11 MIL/uL   Hemoglobin 12.5  12.0 - 15.0 g/dL   HCT 40.936.9  81.136.0 - 91.446.0 %   MCV 87.4  78.0 - 100.0 fL   MCH 29.6  26.0 - 34.0 pg   MCHC 33.9  30.0 - 36.0 g/dL   RDW 78.217.5 (*) 95.611.5  - 15.5 %   Platelets 279  150 - 400 K/uL   Neutrophils Relative % 78 (*) 43 - 77 %   Neutro Abs 5.0  1.7 - 7.7 K/uL   Lymphocytes Relative 13  12 - 46 %   Lymphs Abs 0.9  0.7 - 4.0 K/uL   Monocytes Relative 9  3 - 12 %   Monocytes Absolute 0.6  0.1 - 1.0 K/uL   Eosinophils Relative 0  0 - 5 %   Eosinophils Absolute 0.0  0.0 - 0.7 K/uL   Basophils Relative 0  0 - 1 %   Basophils Absolute 0.0  0.0 - 0.1 K/uL    IMAGING No results found.  MAU COURSE IV LR with Phenergan> feels much better and retaining fluids and crackers  ASSESSMENT  1. Viral gastroenteritis   2. Diabetes mellitus   3. Hypertension   4. Mild dehydration     PLAN Discharge home. See AVS for patient education. K+ rich foods   Medication List         Canagliflozin-Metformin HCl 50-500 MG Tabs  Commonly known as:  INVOKAMET  Take 1 tablet by mouth 2 (two) times daily.     ferrous gluconate 324 MG tablet  Commonly known as:  FERGON  Take 1 tablet (324 mg total) by mouth 2 (two) times daily with a meal.     hydrochlorothiazide 25 MG tablet  Commonly known as:  HYDRODIURIL  Take 1 tablet (25 mg total) by mouth daily.        Follow-up Information   Schedule an appointment as soon as possible for a visit with Ernst BreachYSINGER, DAVID SHANE, PA-C.   Specialty:  Family Medicine   Contact information:   [address] [place] [address] [phone]       Danae OrleansDeirdre C Sydne Krahl, CNM 01/20/2014 2:58 PM

## 2014-01-20 NOTE — MAU Note (Signed)
Started last night with vomiting(8x) and diarrhea (5 x during the night), started again this morning. Vomit x2, diarrhea x1 this morning. No one else at home is sick

## 2014-01-20 NOTE — Discharge Instructions (Signed)
Viral Gastroenteritis Viral gastroenteritis is also known as stomach flu. This condition affects the stomach and intestinal tract. It can cause sudden diarrhea and vomiting. The illness typically lasts 3 to 8 days. Most people develop an immune response that eventually gets rid of the virus. While this natural response develops, the virus can make you quite ill. CAUSES  Many different viruses can cause gastroenteritis, such as rotavirus or noroviruses. You can catch one of these viruses by consuming contaminated food or water. You may also catch a virus by sharing utensils or other personal items with an infected person or by touching a contaminated surface. SYMPTOMS  The most common symptoms are diarrhea and vomiting. These problems can cause a severe loss of body fluids (dehydration) and a body salt (electrolyte) imbalance. Other symptoms may include:  Fever.  Headache.  Fatigue.  Abdominal pain. DIAGNOSIS  Your caregiver can usually diagnose viral gastroenteritis based on your symptoms and a physical exam. A stool sample may also be taken to test for the presence of viruses or other infections. TREATMENT  This illness typically goes away on its own. Treatments are aimed at rehydration. The most serious cases of viral gastroenteritis involve vomiting so severely that you are not able to keep fluids down. In these cases, fluids must be given through an intravenous line (IV). HOME CARE INSTRUCTIONS   Drink enough fluids to keep your urine clear or pale yellow. Drink small amounts of fluids frequently and increase the amounts as tolerated.  Ask your caregiver for specific rehydration instructions.  Avoid:  Foods high in sugar.  Alcohol.  Carbonated drinks.  Tobacco.  Juice.  Caffeine drinks.  Extremely hot or cold fluids.  Fatty, greasy foods.  Too much intake of anything at one time.  Dairy products until 24 to 48 hours after diarrhea stops.  You may consume probiotics.  Probiotics are active cultures of beneficial bacteria. They may lessen the amount and number of diarrheal stools in adults. Probiotics can be found in yogurt with active cultures and in supplements.  Wash your hands well to avoid spreading the virus.  Only take over-the-counter or prescription medicines for pain, discomfort, or fever as directed by your caregiver. Do not give aspirin to children. Antidiarrheal medicines are not recommended.  Ask your caregiver if you should continue to take your regular prescribed and over-the-counter medicines.  Keep all follow-up appointments as directed by your caregiver. SEEK IMMEDIATE MEDICAL CARE IF:   You are unable to keep fluids down.  You do not urinate at least once every 6 to 8 hours.  You develop shortness of breath.  You notice blood in your stool or vomit. This may look like coffee grounds.  You have abdominal pain that increases or is concentrated in one small area (localized).  You have persistent vomiting or diarrhea.  You have a fever.  The patient is a child younger than 3 months, and he or she has a fever.  The patient is a child older than 3 months, and he or she has a fever and persistent symptoms.  The patient is a child older than 3 months, and he or she has a fever and symptoms suddenly get worse.  The patient is a baby, and he or she has no tears when crying. MAKE SURE YOU:   Understand these instructions.  Will watch your condition.  Will get help right away if you are not doing well or get worse. Document Released: 08/20/2005 Document Revised: 11/12/2011 Document Reviewed: 06/06/2011   [employer] Patient Information 2014 [employer]  

## 2014-01-22 ENCOUNTER — Other Ambulatory Visit: Payer: Self-pay

## 2014-02-23 ENCOUNTER — Ambulatory Visit: Payer: No Typology Code available for payment source | Admitting: Gynecology

## 2014-03-19 ENCOUNTER — Other Ambulatory Visit: Payer: No Typology Code available for payment source

## 2014-03-19 ENCOUNTER — Ambulatory Visit (INDEPENDENT_AMBULATORY_CARE_PROVIDER_SITE_OTHER): Payer: No Typology Code available for payment source | Admitting: Medical

## 2014-03-19 ENCOUNTER — Encounter: Payer: Self-pay | Admitting: Medical

## 2014-03-19 VITALS — BP 132/80 | HR 80 | Temp 98.7°F | Resp 16 | Wt 205.0 lb

## 2014-03-19 DIAGNOSIS — Z111 Encounter for screening for respiratory tuberculosis: Secondary | ICD-10-CM

## 2014-03-19 DIAGNOSIS — E1165 Type 2 diabetes mellitus with hyperglycemia: Principal | ICD-10-CM

## 2014-03-19 DIAGNOSIS — R252 Cramp and spasm: Secondary | ICD-10-CM

## 2014-03-19 DIAGNOSIS — E876 Hypokalemia: Secondary | ICD-10-CM

## 2014-03-19 DIAGNOSIS — I10 Essential (primary) hypertension: Secondary | ICD-10-CM

## 2014-03-19 DIAGNOSIS — IMO0001 Reserved for inherently not codable concepts without codable children: Secondary | ICD-10-CM

## 2014-03-19 DIAGNOSIS — J309 Allergic rhinitis, unspecified: Secondary | ICD-10-CM

## 2014-03-19 LAB — BASIC METABOLIC PANEL
BUN: 10 mg/dL (ref 6–23)
CHLORIDE: 101 meq/L (ref 96–112)
CO2: 28 meq/L (ref 19–32)
CREATININE: 0.9 mg/dL (ref 0.50–1.10)
Calcium: 8.9 mg/dL (ref 8.4–10.5)
GLUCOSE: 258 mg/dL — AB (ref 70–99)
Potassium: 4.1 mEq/L (ref 3.5–5.3)
Sodium: 136 mEq/L (ref 135–145)

## 2014-03-19 LAB — HEMOGLOBIN A1C
Hgb A1c MFr Bld: 9.5 % — ABNORMAL HIGH (ref <5.7)
Mean Plasma Glucose: 226 mg/dL — ABNORMAL HIGH (ref <117)

## 2014-03-19 MED ORDER — POTASSIUM CHLORIDE ER 10 MEQ PO TBCR: 10.0000 meq | EXTENDED_RELEASE_TABLET | Freq: Every day | ORAL | Status: DC

## 2014-03-19 MED ORDER — CETIRIZINE HCL 10 MG PO TABS: 10.0000 mg | ORAL_TABLET | Freq: Every day | ORAL | Status: DC

## 2014-03-19 NOTE — Progress Notes (Signed)
Subjective: Here for multiple concerns.  Here for recheck on diabetes and high blood pressure diagnosed last visit.  She is compliant with the Invokamet twice daily, compliant blood pressure medicine hydrochlorothiazide 25 mg daily.  States she is checking her glucose and getting under 130 glucose in the morning.  Says she is making diet changes cut back on soda cut back on sweet tea, exercising trying to do better  She would like to go back on Depo-Provera.  Started menarche age 23, last menstrual period just ended yesterday, periods are regular not too heavy. She was on oral birth control one point but it didn't help her periods get regulated, was on Depo-Provera for 2-3 years, but it's been a while since she's been off this.  She wants contraception management would like to restart Depo Provera.  Last Pap she thinks was last year with Dr. Audie BoxFontaine  She has morning allergy symptoms runny nose, sneezing, would like allergy shots, no prior allergy shots her allergist referral. Not taking any medication for this.  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  Review of systems as in subjective  Objective  Filed Vitals:   03/19/14 1442  BP: 132/80  Pulse: 80  Temp: 98.7 F (37.1 C)  Resp: 16    General appearance: alert, no distress, WD/WN  HEENT: normocephalic, sclerae anicteric, TMs pearly, nares patent, no discharge or erythema, pharynx normal Oral cavity: MMM, no lesions Neck: supple, no lymphadenopathy, no thyromegaly, no masses Heart: RRR, normal S1, S2, no murmurs Lungs: CTA bilaterally, no wheezes, rhonchi, or rales Abdomen: +bs, soft, non tender, non distended, no masses, no hepatomegaly, no splenomegaly Pulses: 2+ symmetric, upper and lower extremities, normal cap refill   Assessment Encounter Diagnoses  Name Primary?  . Type II or unspecified type diabetes mellitus without mention of complication, uncontrolled Yes  . Hypokalemia   . Essential hypertension, benign   .  Screening examination for pulmonary tuberculosis   . Allergic rhinitis, unspecified allergic rhinitis type   . Cramps of lower extremity, unspecified laterality    Plan Of note she had hypokalemia with hospital visit back in May. I suspect this is from her diuretic which may be contributing to the leg cramps. Begin K-Dur 10 mEq daily along with continuing her blood pressure medicine HCTZ 25 mg daily  Continue Invokamet twice daily. Continue to improve diet and exercise, continue glucose monitoring  PPD placed today, she will return Monday for reading  Will get copy of her prior Pap smear, advise she'll need to come back for a physical for contraception management  Allergies-begin cetirizine daily

## 2014-03-22 ENCOUNTER — Other Ambulatory Visit: Payer: Self-pay | Admitting: Medical

## 2014-03-22 LAB — TB SKIN TEST
INDURATION: 0 mm
TB Skin Test: NEGATIVE

## 2014-03-22 MED ORDER — SITAGLIPTIN PHOSPHATE 50 MG PO TABS: 50.0000 mg | ORAL_TABLET | Freq: Every day | ORAL | Status: DC

## 2014-03-22 MED ORDER — CANAGLIFLOZIN-METFORMIN HCL 50-1000 MG PO TABS: 1.0000 | ORAL_TABLET | Freq: Two times a day (BID) | ORAL | Status: DC

## 2014-04-07 ENCOUNTER — Encounter (HOSPITAL_COMMUNITY): Payer: Self-pay | Admitting: Emergency Medicine

## 2014-04-07 ENCOUNTER — Emergency Department (HOSPITAL_COMMUNITY)
Admission: EM | Admit: 2014-04-07 | Discharge: 2014-04-08 | Disposition: A | Discharge status: Home / Self Care | Payer: No Typology Code available for payment source | Attending: Emergency Medicine | Admitting: Emergency Medicine

## 2014-04-07 DIAGNOSIS — E860 Dehydration: Secondary | ICD-10-CM | POA: Insufficient documentation

## 2014-04-07 DIAGNOSIS — R5381 Other malaise: Secondary | ICD-10-CM | POA: Insufficient documentation

## 2014-04-07 DIAGNOSIS — I10 Essential (primary) hypertension: Secondary | ICD-10-CM | POA: Insufficient documentation

## 2014-04-07 DIAGNOSIS — Z872 Personal history of diseases of the skin and subcutaneous tissue: Secondary | ICD-10-CM | POA: Insufficient documentation

## 2014-04-07 DIAGNOSIS — E119 Type 2 diabetes mellitus without complications: Secondary | ICD-10-CM | POA: Insufficient documentation

## 2014-04-07 DIAGNOSIS — Z862 Personal history of diseases of the blood and blood-forming organs and certain disorders involving the immune mechanism: Secondary | ICD-10-CM | POA: Insufficient documentation

## 2014-04-07 DIAGNOSIS — R739 Hyperglycemia, unspecified: Secondary | ICD-10-CM

## 2014-04-07 DIAGNOSIS — Z79899 Other long term (current) drug therapy: Secondary | ICD-10-CM | POA: Insufficient documentation

## 2014-04-07 DIAGNOSIS — R5383 Other fatigue: Secondary | ICD-10-CM

## 2014-04-07 DIAGNOSIS — Z87891 Personal history of nicotine dependence: Secondary | ICD-10-CM | POA: Insufficient documentation

## 2014-04-07 DIAGNOSIS — R55 Syncope and collapse: Secondary | ICD-10-CM | POA: Insufficient documentation

## 2014-04-07 DIAGNOSIS — R42 Dizziness and giddiness: Secondary | ICD-10-CM | POA: Insufficient documentation

## 2014-04-07 DIAGNOSIS — J45909 Unspecified asthma, uncomplicated: Secondary | ICD-10-CM | POA: Insufficient documentation

## 2014-04-07 LAB — I-STAT CHEM 8, ED
BUN: 6 mg/dL (ref 6–23)
CREATININE: 0.9 mg/dL (ref 0.50–1.10)
Calcium, Ion: 1.13 mmol/L (ref 1.12–1.23)
Chloride: 101 mEq/L (ref 96–112)
GLUCOSE: 334 mg/dL — AB (ref 70–99)
HCT: 49 % — ABNORMAL HIGH (ref 36.0–46.0)
Hemoglobin: 16.7 g/dL — ABNORMAL HIGH (ref 12.0–15.0)
Potassium: 3.5 mEq/L — ABNORMAL LOW (ref 3.7–5.3)
SODIUM: 135 meq/L — AB (ref 137–147)
TCO2: 24 mmol/L (ref 0–100)

## 2014-04-07 MED ORDER — SODIUM CHLORIDE 0.9 % IV BOLUS (SEPSIS)
1000.0000 mL | Freq: Once | INTRAVENOUS | Status: AC
  Administered 2014-04-07: 1000 mL via INTRAVENOUS

## 2014-04-07 NOTE — ED Provider Notes (Signed)
CSN: [id number]     Arrival date & time 04/07/14  2053 History   First MD Initiated Contact with Patient 04/07/14 2059     Chief Complaint  Patient presents with  . Dizziness     (Consider location/radiation/quality/duration/timing/severity/associated sxs/prior Treatment) HPI 23 year old female presents after 2 near syncopal episodes. The patient states that 3 hours ago she gave plasma. She ate before giving plasma has not had anything to drink since. She was standing at the bus stop for an hour waiting on the bus in the hot sun. She states she felt dizziness, blurry vision, and feeling like she's going to faint twice. She did not lose consciousness either time. Sitting down her symptoms feel better. At this time she feels better and almost like her normal self. Denies any vomiting. She does feel thirsty. Otherwise has not had any other acute issues.  Past Medical History  Diagnosis Date  . Allergy   . Diabetes mellitus without complication 2015  . Anemia 2015  . Hypertension 09/2013  . Asthma     as child  . Eczema    Past Surgical History  Procedure Laterality Date  . No past surgeries     Family History  Problem Relation Age of Onset  . Hypertension Mother   . Heart disease Mother 4440    CHF  . Diabetes Mother   . Sickle cell anemia Brother   . Hypertension Maternal Grandfather    History  Substance Use Topics  . Smoking status: Former Smoker -- 0.50 packs/day for 2 years    Quit date: 09/08/2013  . Smokeless tobacco: Never Used  . Alcohol Use: No   OB History   Grav Para Term Preterm Abortions TAB SAB Ect Mult Living   0              Review of Systems  Constitutional: Negative for fever.  Respiratory: Negative for shortness of breath.   Cardiovascular: Negative for chest pain.  Gastrointestinal: Negative for vomiting and abdominal pain.  Neurological: Positive for dizziness, syncope (near syncope) and weakness.  All other systems reviewed and are  negative.     Allergies  Iodides; Morphine and related; and Shellfish allergy  Home Medications   Prior to Admission medications   Medication Sig Start Date End Date Taking? Authorizing Provider  Canagliflozin-Metformin HCl (INVOKAMET) 50-1000 MG TABS Take 1 tablet by mouth 2 (two) times daily. 03/22/14   Kermit Baloavid S Tysinger, PA-C  cetirizine (ZYRTEC) 10 MG tablet Take 1 tablet (10 mg total) by mouth at bedtime. 03/19/14   Kermit Baloavid S Tysinger, PA-C  ferrous gluconate (FERGON) 324 MG tablet Take 1 tablet (324 mg total) by mouth 2 (two) times daily with a meal. 12/15/13   Kermit Baloavid S Tysinger, PA-C  hydrochlorothiazide (HYDRODIURIL) 25 MG tablet Take 1 tablet (25 mg total) by mouth daily. 12/11/13   Kermit Baloavid S Tysinger, PA-C  potassium chloride (K-DUR) 10 MEQ tablet Take 1 tablet (10 mEq total) by mouth daily. 03/19/14   Kermit Baloavid S Tysinger, PA-C  sitaGLIPtin (JANUVIA) 50 MG tablet Take 1 tablet (50 mg total) by mouth daily. 03/22/14   Kermit Baloavid S Tysinger, PA-C   BP 118/76  Pulse 96  Temp(Src) 98.6 F (37 C) (Oral)  Resp 18  SpO2 100% Physical Exam  Nursing note and vitals reviewed. Constitutional: She is oriented to person, place, and time. She appears well-developed and well-nourished. No distress.  HENT:  Head: Normocephalic and atraumatic.  Right Ear: External ear normal.  Left Ear: External  ear normal.  Nose: Nose normal.  Eyes: Right eye exhibits no discharge. Left eye exhibits no discharge.  Cardiovascular: Normal rate, regular rhythm and normal heart sounds.   Pulmonary/Chest: Effort normal and breath sounds normal.  Abdominal: Soft. She exhibits no distension. There is no tenderness.  Neurological: She is alert and oriented to person, place, and time.  Skin: Skin is warm and dry.    ED Course  Procedures (including critical care time) Labs Review Labs Reviewed  I-STAT CHEM 8, ED - Abnormal; Notable for the following:    Sodium 135 (*)    Potassium 3.5 (*)    Glucose, Bld 334 (*)     Hemoglobin 16.7 (*)    HCT 49.0 (*)    All other components within normal limits    Imaging Review No results found.   EKG Interpretation None      MDM   Final diagnoses:  Near syncope  Dehydration  Hyperglycemia    Patient's symptoms improved prior to arrival. Patient still orthostatic, given IV fluids. Electrolytes evaluated, has no significant electrolyte abnormality besides hyperglycemia. No acidosis. AG 10. Given food/drink in ED. Near-syncope c/w dehydration as cause. Recommend increased oral fluid intake and monitoring of her glucose with PCP f/u.    Audree Camel, MD 04/07/14 2255

## 2014-04-07 NOTE — ED Notes (Signed)
Bed: WA02 Expected date:  Expected time:  Means of arrival:  Comments: EMS-dizzy 

## 2014-04-07 NOTE — ED Notes (Signed)
Pt presented by EMS from bus stop, report of pt being dizzy and lightheaded, pt donated plasma this a.m, report of long ensure to hot sun. Pt states feels "ok" now, vitals WDL, reports being thirsty.

## 2014-04-07 NOTE — Discharge Instructions (Signed)
Dehydration, Adult Dehydration is when you lose more fluids from the body than you take in. Vital organs like the kidneys, brain, and heart cannot function without a proper amount of fluids and salt. Any loss of fluids from the body can cause dehydration.  CAUSES   Vomiting.  Diarrhea.  Excessive sweating.  Excessive urine output.  Fever. SYMPTOMS  Mild dehydration  Thirst.  Dry lips.  Slightly dry mouth. Moderate dehydration  Very dry mouth.  Sunken eyes.  Skin does not bounce back quickly when lightly pinched and released.  Dark urine and decreased urine production.  Decreased tear production.  Headache. Severe dehydration  Very dry mouth.  Extreme thirst.  Rapid, weak pulse (more than 100 beats per minute at rest).  Cold hands and feet.  Not able to sweat in spite of heat and temperature.  Rapid breathing.  Blue lips.  Confusion and lethargy.  Difficulty being awakened.  Minimal urine production.  No tears. DIAGNOSIS  Your caregiver will diagnose dehydration based on your symptoms and your exam. Blood and urine tests will help confirm the diagnosis. The diagnostic evaluation should also identify the cause of dehydration. TREATMENT  Treatment of mild or moderate dehydration can often be done at home by increasing the amount of fluids that you drink. It is best to drink small amounts of fluid more often. Drinking too much at one time can make vomiting worse. Refer to the home care instructions below. Severe dehydration needs to be treated at the hospital where you will probably be given intravenous (IV) fluids that contain water and electrolytes. HOME CARE INSTRUCTIONS   Ask your caregiver about specific rehydration instructions.  Drink enough fluids to keep your urine clear or pale yellow.  Drink small amounts frequently if you have nausea and vomiting.  Eat as you normally do.  Avoid:  Foods or drinks high in sugar.  Carbonated  drinks.  Juice.  Extremely hot or cold fluids.  Drinks with caffeine.  Fatty, greasy foods.  Alcohol.  Tobacco.  Overeating.  Gelatin desserts.  Wash your hands well to avoid spreading bacteria and viruses.  Only take over-the-counter or prescription medicines for pain, discomfort, or fever as directed by your caregiver.  Ask your caregiver if you should continue all prescribed and over-the-counter medicines.  Keep all follow-up appointments with your caregiver. SEEK MEDICAL CARE IF:  You have abdominal pain and it increases or stays in one area (localizes).  You have a rash, stiff neck, or severe headache.  You are irritable, sleepy, or difficult to awaken.  You are weak, dizzy, or extremely thirsty. SEEK IMMEDIATE MEDICAL CARE IF:   You are unable to keep fluids down or you get worse despite treatment.  You have frequent episodes of vomiting or diarrhea.  You have blood or green matter (bile) in your vomit.  You have blood in your stool or your stool looks black and tarry.  You have not urinated in 6 to 8 hours, or you have only urinated a small amount of very dark urine.  You have a fever.  You faint. MAKE SURE YOU:   Understand these instructions.  Will watch your condition.  Will get help right away if you are not doing well or get worse. Document Released: 08/20/2005 Document Revised: 11/12/2011 Document Reviewed: 04/09/2011 Exit[employer] Patient Information 2015 [employer]. This information is not intended to replace advice given to you by your health care provider. Make sure you discuss any questions you have with your health care   provider.   Hyperglycemia Hyperglycemia occurs when the glucose (sugar) in your blood is too high. Hyperglycemia can happen for many reasons, but it most often happens to people who do not know they have diabetes or are not managing their diabetes properly.  CAUSES  Whether you have diabetes or not, there are other  causes of hyperglycemia. Hyperglycemia can occur when you have diabetes, but it can also occur in other situations that you might not be as aware of, such as: Diabetes  If you have diabetes and are having problems controlling your blood glucose, hyperglycemia could occur because of some of the following reasons:  Not following your meal plan.  Not taking your diabetes medications or not taking it properly.  Exercising less or doing less activity than you normally do.  Being sick. Pre-diabetes  This cannot be ignored. Before people develop Type 2 diabetes, they almost always have "pre-diabetes." This is when your blood glucose levels are higher than normal, but not yet high enough to be diagnosed as diabetes. Research has shown that some long-term damage to the body, especially the heart and circulatory system, may already be occurring during pre-diabetes. If you take action to manage your blood glucose when you have pre-diabetes, you may delay or prevent Type 2 diabetes from developing. Stress  If you have diabetes, you may be "diet" controlled or on oral medications or insulin to control your diabetes. However, you may find that your blood glucose is higher than usual in the hospital whether you have diabetes or not. This is often referred to as "stress hyperglycemia." Stress can elevate your blood glucose. This happens because of hormones put out by the body during times of stress. If stress has been the cause of your high blood glucose, it can be followed regularly by your caregiver. That way he/she can make sure your hyperglycemia does not continue to get worse or progress to diabetes. Steroids  Steroids are medications that act on the infection fighting system (immune system) to block inflammation or infection. One side effect can be a rise in blood glucose. Most people can produce enough extra insulin to allow for this rise, but for those who cannot, steroids make blood glucose levels go  even higher. It is not unusual for steroid treatments to "uncover" diabetes that is developing. It is not always possible to determine if the hyperglycemia will go away after the steroids are stopped. A special blood test called an A1c is sometimes done to determine if your blood glucose was elevated before the steroids were started. SYMPTOMS  Thirsty.  Frequent urination.  Dry mouth.  Blurred vision.  Tired or fatigue.  Weakness.  Sleepy.  Tingling in feet or leg. DIAGNOSIS  Diagnosis is made by monitoring blood glucose in one or all of the following ways:  A1c test. This is a chemical found in your blood.  Fingerstick blood glucose monitoring.  Laboratory results. TREATMENT  First, knowing the cause of the hyperglycemia is important before the hyperglycemia can be treated. Treatment may include, but is not be limited to:  Education.  Change or adjustment in medications.  Change or adjustment in meal plan.  Treatment for an illness, infection, etc.  More frequent blood glucose monitoring.  Change in exercise plan.  Decreasing or stopping steroids.  Lifestyle changes. HOME CARE INSTRUCTIONS   Test your blood glucose as directed.  Exercise regularly. Your caregiver will give you instructions about exercise. Pre-diabetes or diabetes which comes on with stress is helped by   exercising.  Eat wholesome, balanced meals. Eat often and at regular, fixed times. Your caregiver or nutritionist will give you a meal plan to guide your sugar intake.  Being at an ideal weight is important. If needed, losing as little as 10 to 15 pounds may help improve blood glucose levels. SEEK MEDICAL CARE IF:   You have questions about medicine, activity, or diet.  You continue to have symptoms (problems such as increased thirst, urination, or weight gain). SEEK IMMEDIATE MEDICAL CARE IF:   You are vomiting or have diarrhea.  Your breath smells fruity.  You are breathing faster or  slower.  You are very sleepy or incoherent.  You have numbness, tingling, or pain in your feet or hands.  You have chest pain.  Your symptoms get worse even though you have been following your caregiver's orders.  If you have any other questions or concerns. Document Released: 02/13/2001 Document Revised: 11/12/2011 Document Reviewed: 12/17/2011 Exit[employer] Patient Information 2015 [employer]. This information is not intended to replace advice given to you by your health care provider. Make sure you discuss any questions you have with your health care provider.  

## 2014-04-08 ENCOUNTER — Telehealth: Payer: Self-pay

## 2014-04-08 NOTE — Telephone Encounter (Signed)
[facility] Vincenza HewsShane.

## 2014-04-08 NOTE — Telephone Encounter (Signed)
No response from patient

## 2014-04-09 ENCOUNTER — Telehealth: Payer: Self-pay | Admitting: Family Medicine

## 2014-04-09 NOTE — Telephone Encounter (Signed)
Did the ED not give her any pain medication?  If not, would probably recommend Ibuprofen OTC, 3 tablet 3 times daily for the next few days for pain

## 2014-04-09 NOTE — Telephone Encounter (Signed)
Patient is taking the 2 medications. She was told to check her sugars 2 to 3 times a day. She was also told to schedule a follow up but she said she would call back. She said since she fell can she get something for pain? CLS

## 2014-04-09 NOTE — Telephone Encounter (Signed)
I left the patient a detailed message on her voicemail about pain medication. CLS

## 2014-04-22 ENCOUNTER — Ambulatory Visit: Payer: No Typology Code available for payment source | Admitting: Medical

## 2014-04-29 ENCOUNTER — Ambulatory Visit: Payer: No Typology Code available for payment source | Admitting: Medical

## 2014-05-14 ENCOUNTER — Encounter: Payer: Self-pay | Admitting: Medical

## 2014-05-14 ENCOUNTER — Telehealth: Payer: Self-pay | Admitting: Medical

## 2014-05-14 NOTE — Telephone Encounter (Signed)
Pt sent no show letter/call back list checked

## 2014-05-31 ENCOUNTER — Inpatient Hospital Stay (HOSPITAL_COMMUNITY)
Admission: AD | Admit: 2014-05-31 | Discharge: 2014-05-31 | Disposition: A | Discharge status: Home / Self Care | Payer: No Typology Code available for payment source | Source: Ambulatory Visit | Attending: Family Medicine | Admitting: Family Medicine

## 2014-05-31 ENCOUNTER — Encounter (HOSPITAL_COMMUNITY): Payer: Self-pay | Admitting: *Deleted

## 2014-05-31 DIAGNOSIS — I10 Essential (primary) hypertension: Secondary | ICD-10-CM | POA: Insufficient documentation

## 2014-05-31 DIAGNOSIS — R112 Nausea with vomiting, unspecified: Secondary | ICD-10-CM | POA: Insufficient documentation

## 2014-05-31 DIAGNOSIS — E119 Type 2 diabetes mellitus without complications: Secondary | ICD-10-CM | POA: Insufficient documentation

## 2014-05-31 DIAGNOSIS — Z87891 Personal history of nicotine dependence: Secondary | ICD-10-CM | POA: Insufficient documentation

## 2014-05-31 LAB — URINE MICROSCOPIC-ADD ON

## 2014-05-31 LAB — URINALYSIS, ROUTINE W REFLEX MICROSCOPIC
Glucose, UA: 100 mg/dL — AB
Ketones, ur: 15 mg/dL — AB
Nitrite: POSITIVE — AB
Urobilinogen, UA: 1 mg/dL (ref 0.0–1.0)
pH: 5 (ref 5.0–8.0)

## 2014-05-31 LAB — POCT PREGNANCY, URINE: PREG TEST UR: NEGATIVE

## 2014-05-31 MED ORDER — CIPROFLOXACIN HCL 500 MG PO TABS: 500.0000 mg | ORAL_TABLET | Freq: Two times a day (BID) | ORAL | Status: DC

## 2014-05-31 MED ORDER — PROMETHAZINE HCL 25 MG PO TABS: 25.0000 mg | ORAL_TABLET | Freq: Four times a day (QID) | ORAL | Status: DC | PRN

## 2014-05-31 NOTE — MAU Provider Note (Signed)
History     CSN: [id number]  Arrival date and time: 05/31/14 7829   First Provider Initiated Contact with Patient 05/31/14 [phone]      Chief Complaint  Patient presents with  . Emesis  . Possible Pregnancy   HPI Comments: Misty [name] 23 y.o. [id number] presents to MAU with daily morning vomiting since last Thursday. She is in process of trying to get pregnanct and feels she may be pregnant. She has NIDDM and HTN both poorly controlled. She admits she has not taken her medications or eaten today. She has an appointment with Dr Audie Box this Thursday and her PCP next week.  Emesis   Possible Pregnancy Associated symptoms include nausea and vomiting.      Past Medical History  Diagnosis Date  . Allergy   . Diabetes mellitus without complication 2015  . Anemia 2015  . Hypertension 09/2013  . Asthma     as child  . Eczema     Past Surgical History  Procedure Laterality Date  . No past surgeries      Family History  Problem Relation Age of Onset  . Hypertension Mother   . Heart disease Mother 35    CHF  . Diabetes Mother   . Sickle cell anemia Brother   . Hypertension Maternal Grandfather     History  Substance Use Topics  . Smoking status: Former Smoker -- 0.50 packs/day for 2 years    Quit date: 09/08/2013  . Smokeless tobacco: Never Used  . Alcohol Use: No    Allergies:  Allergies  Allergen Reactions  . Iodides Anaphylaxis and Itching  . Morphine And Related Anaphylaxis and Itching  . Shellfish Allergy Anaphylaxis and Itching    Pt reports "watery eyes"    Prescriptions prior to admission  Medication Sig Dispense Refill  . Canagliflozin-Metformin HCl (INVOKAMET) 50-1000 MG TABS Take 1 tablet by mouth 2 (two) times daily.  60 tablet  3  . cetirizine (ZYRTEC) 10 MG tablet Take 1 tablet (10 mg total) by mouth at bedtime.  30 tablet  5  . ferrous gluconate (FERGON) 324 MG tablet Take 1 tablet (324 mg total) by mouth 2 (two) times daily with a meal.  60  tablet  5  . hydrochlorothiazide (HYDRODIURIL) 25 MG tablet Take 1 tablet (25 mg total) by mouth daily.  30 tablet  5  . potassium chloride (K-DUR) 10 MEQ tablet Take 1 tablet (10 mEq total) by mouth daily.  30 tablet  2    Review of Systems  Constitutional: Negative.   HENT: Negative.   Cardiovascular: Negative.   Gastrointestinal: Positive for nausea and vomiting.  Genitourinary: Negative.   Skin: Negative.   Neurological: Negative.   Psychiatric/Behavioral: Negative.    Physical Exam   Blood pressure 146/85, pulse 73, temperature 98.9 F (37.2 C), resp. rate 18, last menstrual period 04/25/2014.  Physical Exam  Constitutional: She is oriented to person, place, and time. She appears well-developed and well-nourished. No distress.  Obese  HENT:  Head: Normocephalic and atraumatic.  Cardiovascular: Normal rate, regular rhythm and normal heart sounds.   Respiratory: Effort normal and breath sounds normal.  GI: Soft. Bowel sounds are normal. She exhibits no distension. There is no tenderness.  Genitourinary:  Pt refused examination  Musculoskeletal: Normal range of motion.  Neurological: She is alert and oriented to person, place, and time.  Skin: Skin is warm and dry.  Psychiatric: She has a normal mood and affect. Her behavior is normal. Judgment  and thought content normal.   Results for orders placed during the hospital encounter of 05/31/14 (from the past 24 hour(s))  URINALYSIS, ROUTINE W REFLEX MICROSCOPIC     Status: Abnormal   Collection Time    05/31/14  9:25 AM      Result Value Ref Range   Color, Urine RED (*) YELLOW   APPearance CLOUDY (*) CLEAR   Specific Gravity, Urine >1.030 (*) 1.005 - 1.030   pH 5.0  5.0 - 8.0   Glucose, UA 100 (*) NEGATIVE mg/dL   Hgb urine dipstick LARGE (*) NEGATIVE   Bilirubin Urine SMALL (*) NEGATIVE   Ketones, ur 15 (*) NEGATIVE mg/dL   Protein, ur >161 (*) NEGATIVE mg/dL   Urobilinogen, UA 1.0  0.0 - 1.0 mg/dL   Nitrite POSITIVE  (*) NEGATIVE   Leukocytes, UA TRACE (*) NEGATIVE  URINE MICROSCOPIC-ADD ON     Status: Abnormal   Collection Time    05/31/14  9:25 AM      Result Value Ref Range   Squamous Epithelial / LPF FEW (*) RARE   WBC, UA 0-2  <3 WBC/hpf   RBC / HPF TOO NUMEROUS TO COUNT  <3 RBC/hpf   Bacteria, UA MANY (*) RARE   Urine-Other MUCOUS PRESENT    POCT PREGNANCY, URINE     Status: None   Collection Time    05/31/14  9:39 AM      Result Value Ref Range   Preg Test, Ur NEGATIVE  NEGATIVE    MAU Course  Procedures  MDM  Urine culture  Assessment and Plan   A: Nausea and Vomiting UTI/ culture pending Poorly Controlled HTN, NIDDM  P: Advised taking medications for blood sugar and blood pressure control Advised keep appointments with Dr Audie Box and PCP Advised to start Prenatal Vitamins Phenergan 25 mg po q6 hours prn nausea Advised needed vaginal cultures to include GC/Chlamydia Follow up as needed in MAU   Carolynn Serve 05/31/2014, 10:12 AM

## 2014-05-31 NOTE — Discharge Instructions (Signed)
Nausea and Vomiting Nausea means you feel sick to your stomach. Throwing up (vomiting) is a reflex where stomach contents come out of your mouth. HOME CARE   Take medicine as told by your doctor.  Do not force yourself to eat. However, you do need to drink fluids.  If you feel like eating, eat a normal diet as told by your doctor.  Eat rice, wheat, potatoes, bread, lean meats, yogurt, fruits, and vegetables.  Avoid high-fat foods.  Drink enough fluids to keep your pee (urine) clear or pale yellow.  Ask your doctor how to replace body fluid losses (rehydrate). Signs of body fluid loss (dehydration) include:  Feeling very thirsty.  Dry lips and mouth.  Feeling dizzy.  Dark pee.  Peeing less than normal.  Feeling confused.  Fast breathing or heart rate. GET HELP RIGHT AWAY IF:   You have blood in your throw up.  You have black or bloody poop (stool).  You have a bad headache or stiff neck.  You feel confused.  You have bad belly (abdominal) pain.  You have chest pain or trouble breathing.  You do not pee at least once every 8 hours.  You have cold, clammy skin.  You keep throwing up after 24 to 48 hours.  You have a fever. MAKE SURE YOU:   Understand these instructions.  Will watch your condition.  Will get help right away if you are not doing well or get worse. Document Released: 02/06/2008 Document Revised: 11/12/2011 Document Reviewed: 01/19/2011 [facility] Patient Information 2015 [place], [place]. This information is not intended to replace advice given to you by your health care provider. Make sure you discuss any questions you have with your health care provider. First Trimester of Pregnancy The first trimester of pregnancy is from week 1 until the end of week 12 (months 1 through 3). A week after a sperm fertilizes an egg, the egg will implant on the wall of the uterus. This embryo will begin to develop into a baby. Genes from you and your partner are  forming the baby. The female genes determine whether the baby is a boy or a girl. At 6-8 weeks, the eyes and face are formed, and the heartbeat can be seen on ultrasound. At the end of 12 weeks, all the baby's organs are formed.  Now that you are pregnant, you will want to do everything you can to have a healthy baby. Two of the most important things are to get good prenatal care and to follow your health care provider's instructions. Prenatal care is all the medical care you receive before the baby's birth. This care will help prevent, find, and treat any problems during the pregnancy and childbirth. BODY CHANGES Your body goes through many changes during pregnancy. The changes vary from woman to woman.   You may gain or lose a couple of pounds at first.  You may feel sick to your stomach (nauseous) and throw up (vomit). If the vomiting is uncontrollable, call your health care provider.  You may tire easily.  You may develop headaches that can be relieved by medicines approved by your health care provider.  You may urinate more often. Painful urination may mean you have a bladder infection.  You may develop heartburn as a result of your pregnancy.  You may develop constipation because certain hormones are causing the muscles that push waste through your intestines to slow down.  You may develop hemorrhoids or swollen, bulging veins (varicose veins).  Your  breasts may begin to grow larger and become tender. Your nipples may stick out more, and the tissue that surrounds them (areola) may become darker.  Your gums may bleed and may be sensitive to brushing and flossing.  Dark spots or blotches (chloasma, mask of pregnancy) may develop on your face. This will likely fade after the baby is born.  Your menstrual periods will stop.  You may have a loss of appetite.  You may develop cravings for certain kinds of food.  You may have changes in your emotions from day to day, such as being  excited to be pregnant or being concerned that something may go wrong with the pregnancy and baby.  You may have more vivid and strange dreams.  You may have changes in your hair. These can include thickening of your hair, rapid growth, and changes in texture. Some women also have hair loss during or after pregnancy, or hair that feels dry or thin. Your hair will most likely return to normal after your baby is born. WHAT TO EXPECT AT YOUR PRENATAL VISITS During a routine prenatal visit:  You will be weighed to make sure you and the baby are growing normally.  Your blood pressure will be taken.  Your abdomen will be measured to track your baby's growth.  The fetal heartbeat will be listened to starting around week 10 or 12 of your pregnancy.  Test results from any previous visits will be discussed. Your health care provider may ask you:  How you are feeling.  If you are feeling the baby move.  If you have had any abnormal symptoms, such as leaking fluid, bleeding, severe headaches, or abdominal cramping.  If you have any questions. Other tests that may be performed during your first trimester include:  Blood tests to find your blood type and to check for the presence of any previous infections. They will also be used to check for low iron levels (anemia) and Rh antibodies. Later in the pregnancy, blood tests for diabetes will be done along with other tests if problems develop.  Urine tests to check for infections, diabetes, or protein in the urine.  An ultrasound to confirm the proper growth and development of the baby.  An amniocentesis to check for possible genetic problems.  Fetal screens for spina bifida and Down syndrome.  You may need other tests to make sure you and the baby are doing well. HOME CARE INSTRUCTIONS  Medicines  Follow your health care provider's instructions regarding medicine use. Specific medicines may be either safe or unsafe to take during  pregnancy.  Take your prenatal vitamins as directed.  If you develop constipation, try taking a stool softener if your health care provider approves. Diet  Eat regular, well-balanced meals. Choose a variety of foods, such as meat or vegetable-based protein, fish, milk and low-fat dairy products, vegetables, fruits, and whole grain breads and cereals. Your health care provider will help you determine the amount of weight gain that is right for you.  Avoid raw meat and uncooked cheese. These carry germs that can cause birth defects in the baby.  Eating four or five small meals rather than three large meals a day may help relieve nausea and vomiting. If you start to feel nauseous, eating a few soda crackers can be helpful. Drinking liquids between meals instead of during meals also seems to help nausea and vomiting.  If you develop constipation, eat more high-fiber foods, such as fresh vegetables or fruit and whole  grains. Drink enough fluids to keep your urine clear or pale yellow. Activity and Exercise  Exercise only as directed by your health care provider. Exercising will help you:  Control your weight.  Stay in shape.  Be prepared for labor and delivery.  Experiencing pain or cramping in the lower abdomen or low back is a good sign that you should stop exercising. Check with your health care provider before continuing normal exercises.  Try to avoid standing for long periods of time. Move your legs often if you must stand in one place for a long time.  Avoid heavy lifting.  Wear low-heeled shoes, and practice good posture.  You may continue to have sex unless your health care provider directs you otherwise. Relief of Pain or Discomfort  Wear a good support bra for breast tenderness.   Take warm sitz baths to soothe any pain or discomfort caused by hemorrhoids. Use hemorrhoid cream if your health care provider approves.   Rest with your legs elevated if you have leg cramps  or low back pain.  If you develop varicose veins in your legs, wear support hose. Elevate your feet for 15 minutes, 3-4 times a day. Limit salt in your diet. Prenatal Care  Schedule your prenatal visits by the twelfth week of pregnancy. They are usually scheduled monthly at first, then more often in the last 2 months before delivery.  Write down your questions. Take them to your prenatal visits.  Keep all your prenatal visits as directed by your health care provider. Safety  Wear your seat belt at all times when driving.  Make a list of emergency phone numbers, including numbers for family, friends, the hospital, and police and fire departments. General Tips  Ask your health care provider for a referral to a local prenatal education class. Begin classes no later than at the beginning of month 6 of your pregnancy.  Ask for help if you have counseling or nutritional needs during pregnancy. Your health care provider can offer advice or refer you to specialists for help with various needs.  Do not use hot tubs, steam rooms, or saunas.  Do not douche or use tampons or scented sanitary pads.  Do not cross your legs for long periods of time.  Avoid cat litter boxes and soil used by cats. These carry germs that can cause birth defects in the baby and possibly loss of the fetus by miscarriage or stillbirth.  Avoid all smoking, herbs, alcohol, and medicines not prescribed by your health care provider. Chemicals in these affect the formation and growth of the baby.  Schedule a dentist appointment. At home, brush your teeth with a soft toothbrush and be gentle when you floss. SEEK MEDICAL CARE IF:   You have dizziness.  You have mild pelvic cramps, pelvic pressure, or nagging pain in the abdominal area.  You have persistent nausea, vomiting, or diarrhea.  You have a bad smelling vaginal discharge.  You have pain with urination.  You notice increased swelling in your face, hands, legs,  or ankles. SEEK IMMEDIATE MEDICAL CARE IF:   You have a fever.  You are leaking fluid from your vagina.  You have spotting or bleeding from your vagina.  You have severe abdominal cramping or pain.  You have rapid weight gain or loss.  You vomit blood or material that looks like coffee grounds.  You are exposed to [place] measles and have never had them.  You are exposed to fifth disease or chickenpox.  You develop a severe headache.  You have shortness of breath.  You have any kind of trauma, such as from a fall or a car accident. Document Released: 08/14/2001 Document Revised: 01/04/2014 Document Reviewed: 06/30/2013 [facility] Patient Information 2015 [place], [place]. This information is not intended to replace advice given to you by your health care provider. Make sure you discuss any questions you have with your health care provider. Urinary Tract Infection Urinary tract infections (UTIs) can develop anywhere along your urinary tract. Your urinary tract is your body's drainage system for removing wastes and extra water. Your urinary tract includes two kidneys, two ureters, a bladder, and a urethra. Your kidneys are a pair of bean-shaped organs. Each kidney is about the size of your fist. They are located below your ribs, one on each side of your spine. CAUSES Infections are caused by microbes, which are microscopic organisms, including fungi, viruses, and bacteria. These organisms are so small that they can only be seen through a microscope. Bacteria are the microbes that most commonly cause UTIs. SYMPTOMS  Symptoms of UTIs may vary by age and gender of the patient and by the location of the infection. Symptoms in young women typically include a frequent and intense urge to urinate and a painful, burning feeling in the bladder or urethra during urination. Older women and men are more likely to be tired, shaky, and weak and have muscle aches and abdominal pain. A fever may mean the  infection is in your kidneys. Other symptoms of a kidney infection include pain in your back or sides below the ribs, nausea, and vomiting. DIAGNOSIS To diagnose a UTI, your caregiver will ask you about your symptoms. Your caregiver also will ask to provide a urine sample. The urine sample will be tested for bacteria and white blood cells. White blood cells are made by your body to help fight infection. TREATMENT  Typically, UTIs can be treated with medication. Because most UTIs are caused by a bacterial infection, they usually can be treated with the use of antibiotics. The choice of antibiotic and length of treatment depend on your symptoms and the type of bacteria causing your infection. HOME CARE INSTRUCTIONS  If you were prescribed antibiotics, take them exactly as your caregiver instructs you. Finish the medication even if you feel better after you have only taken some of the medication.  Drink enough water and fluids to keep your urine clear or pale yellow.  Avoid caffeine, tea, and carbonated beverages. They tend to irritate your bladder.  Empty your bladder often. Avoid holding urine for long periods of time.  Empty your bladder before and after sexual intercourse.  After a bowel movement, women should cleanse from front to back. Use each tissue only once. SEEK MEDICAL CARE IF:   You have back pain.  You develop a fever.  Your symptoms do not begin to resolve within 3 days. SEEK IMMEDIATE MEDICAL CARE IF:   You have severe back pain or lower abdominal pain.  You develop chills.  You have nausea or vomiting.  You have continued burning or discomfort with urination. MAKE SURE YOU:   Understand these instructions.  Will watch your condition.  Will get help right away if you are not doing well or get worse. Document Released: 05/30/2005 Document Revised: 02/19/2012 Document Reviewed: 09/28/2011 [facility] Patient Information 2015 [place], [place]. This information is not  intended to replace advice given to you by your health care provider. Make sure you discuss any questions you have  with your health care provider. Nausea and Vomiting Nausea is a sick feeling that often comes before throwing up (vomiting). Vomiting is a reflex where stomach contents come out of your mouth. Vomiting can cause severe loss of body fluids (dehydration). Children and elderly adults can become dehydrated quickly, especially if they also have diarrhea. Nausea and vomiting are symptoms of a condition or disease. It is important to find the cause of your symptoms. CAUSES   Direct irritation of the stomach lining. This irritation can result from increased acid production (gastroesophageal reflux disease), infection, food poisoning, taking certain medicines (such as nonsteroidal anti-inflammatory drugs), alcohol use, or tobacco use.  Signals from the brain.These signals could be caused by a headache, heat exposure, an inner ear disturbance, increased pressure in the brain from injury, infection, a tumor, or a concussion, pain, emotional stimulus, or metabolic problems.  An obstruction in the gastrointestinal tract (bowel obstruction).  Illnesses such as diabetes, hepatitis, gallbladder problems, appendicitis, kidney problems, cancer, sepsis, atypical symptoms of a heart attack, or eating disorders.  Medical treatments such as chemotherapy and radiation.  Receiving medicine that makes you sleep (general anesthetic) during surgery. DIAGNOSIS Your caregiver may ask for tests to be done if the problems do not improve after a few days. Tests may also be done if symptoms are severe or if the reason for the nausea and vomiting is not clear. Tests may include:  Urine tests.  Blood tests.  Stool tests.  Cultures (to look for evidence of infection).  X-rays or other imaging studies. Test results can help your caregiver make decisions about treatment or the need for additional  tests. TREATMENT You need to stay well hydrated. Drink frequently but in small amounts.You may wish to drink water, sports drinks, clear broth, or eat frozen ice pops or gelatin dessert to help stay hydrated.When you eat, eating slowly may help prevent nausea.There are also some antinausea medicines that may help prevent nausea. HOME CARE INSTRUCTIONS   Take all medicine as directed by your caregiver.  If you do not have an appetite, do not force yourself to eat. However, you must continue to drink fluids.  If you have an appetite, eat a normal diet unless your caregiver tells you differently.  Eat a variety of complex carbohydrates (rice, wheat, potatoes, bread), lean meats, yogurt, fruits, and vegetables.  Avoid high-fat foods because they are more difficult to digest.  Drink enough water and fluids to keep your urine clear or pale yellow.  If you are dehydrated, ask your caregiver for specific rehydration instructions. Signs of dehydration may include:  Severe thirst.  Dry lips and mouth.  Dizziness.  Dark urine.  Decreasing urine frequency and amount.  Confusion.  Rapid breathing or pulse. SEEK IMMEDIATE MEDICAL CARE IF:   You have blood or brown flecks (like coffee grounds) in your vomit.  You have black or bloody stools.  You have a severe headache or stiff neck.  You are confused.  You have severe abdominal pain.  You have chest pain or trouble breathing.  You do not urinate at least once every 8 hours.  You develop cold or clammy skin.  You continue to vomit for longer than 24 to 48 hours.  You have a fever. MAKE SURE YOU:   Understand these instructions.  Will watch your condition.  Will get help right away if you are not doing well or get worse. Document Released: 08/20/2005 Document Revised: 11/12/2011 Document Reviewed: 01/17/2011 [facility] Patient Information 2015 [place], [place].  This information is not intended to replace advice given to  you by your health care provider. Make sure you discuss any questions you have with your health care provider. Urinary Tract Infection Urinary tract infections (UTIs) can develop anywhere along your urinary tract. Your urinary tract is your body's drainage system for removing wastes and extra water. Your urinary tract includes two kidneys, two ureters, a bladder, and a urethra. Your kidneys are a pair of bean-shaped organs. Each kidney is about the size of your fist. They are located below your ribs, one on each side of your spine. CAUSES Infections are caused by microbes, which are microscopic organisms, including fungi, viruses, and bacteria. These organisms are so small that they can only be seen through a microscope. Bacteria are the microbes that most commonly cause UTIs. SYMPTOMS  Symptoms of UTIs may vary by age and gender of the patient and by the location of the infection. Symptoms in young women typically include a frequent and intense urge to urinate and a painful, burning feeling in the bladder or urethra during urination. Older women and men are more likely to be tired, shaky, and weak and have muscle aches and abdominal pain. A fever may mean the infection is in your kidneys. Other symptoms of a kidney infection include pain in your back or sides below the ribs, nausea, and vomiting. DIAGNOSIS To diagnose a UTI, your caregiver will ask you about your symptoms. Your caregiver also will ask to provide a urine sample. The urine sample will be tested for bacteria and white blood cells. White blood cells are made by your body to help fight infection. TREATMENT  Typically, UTIs can be treated with medication. Because most UTIs are caused by a bacterial infection, they usually can be treated with the use of antibiotics. The choice of antibiotic and length of treatment depend on your symptoms and the type of bacteria causing your infection. HOME CARE INSTRUCTIONS  If you were prescribed  antibiotics, take them exactly as your caregiver instructs you. Finish the medication even if you feel better after you have only taken some of the medication.  Drink enough water and fluids to keep your urine clear or pale yellow.  Avoid caffeine, tea, and carbonated beverages. They tend to irritate your bladder.  Empty your bladder often. Avoid holding urine for long periods of time.  Empty your bladder before and after sexual intercourse.  After a bowel movement, women should cleanse from front to back. Use each tissue only once. SEEK MEDICAL CARE IF:   You have back pain.  You develop a fever.  Your symptoms do not begin to resolve within 3 days. SEEK IMMEDIATE MEDICAL CARE IF:   You have severe back pain or lower abdominal pain.  You develop chills.  You have nausea or vomiting.  You have continued burning or discomfort with urination. MAKE SURE YOU:   Understand these instructions.  Will watch your condition.  Will get help right away if you are not doing well or get worse. Document Released: 05/30/2005 Document Revised: 02/19/2012 Document Reviewed: 09/28/2011 [facility] Patient Information 2015 [place], [place]. This information is not intended to replace advice given to you by your health care provider. Make sure you discuss any questions you have with your health care provider. First Trimester of Pregnancy The first trimester of pregnancy is from week 1 until the end of week 12 (months 1 through 3). A week after a sperm fertilizes an egg, the egg will implant on the  wall of the uterus. This embryo will begin to develop into a baby. Genes from you and your partner are forming the baby. The female genes determine whether the baby is a boy or a girl. At 6-8 weeks, the eyes and face are formed, and the heartbeat can be seen on ultrasound. At the end of 12 weeks, all the baby's organs are formed.  Now that you are pregnant, you will want to do everything you can to have a  healthy baby. Two of the most important things are to get good prenatal care and to follow your health care provider's instructions. Prenatal care is all the medical care you receive before the baby's birth. This care will help prevent, find, and treat any problems during the pregnancy and childbirth. BODY CHANGES Your body goes through many changes during pregnancy. The changes vary from woman to woman.   You may gain or lose a couple of pounds at first.  You may feel sick to your stomach (nauseous) and throw up (vomit). If the vomiting is uncontrollable, call your health care provider.  You may tire easily.  You may develop headaches that can be relieved by medicines approved by your health care provider.  You may urinate more often. Painful urination may mean you have a bladder infection.  You may develop heartburn as a result of your pregnancy.  You may develop constipation because certain hormones are causing the muscles that push waste through your intestines to slow down.  You may develop hemorrhoids or swollen, bulging veins (varicose veins).  Your breasts may begin to grow larger and become tender. Your nipples may stick out more, and the tissue that surrounds them (areola) may become darker.  Your gums may bleed and may be sensitive to brushing and flossing.  Dark spots or blotches (chloasma, mask of pregnancy) may develop on your face. This will likely fade after the baby is born.  Your menstrual periods will stop.  You may have a loss of appetite.  You may develop cravings for certain kinds of food.  You may have changes in your emotions from day to day, such as being excited to be pregnant or being concerned that something may go wrong with the pregnancy and baby.  You may have more vivid and strange dreams.  You may have changes in your hair. These can include thickening of your hair, rapid growth, and changes in texture. Some women also have hair loss during or  after pregnancy, or hair that feels dry or thin. Your hair will most likely return to normal after your baby is born. WHAT TO EXPECT AT YOUR PRENATAL VISITS During a routine prenatal visit:  You will be weighed to make sure you and the baby are growing normally.  Your blood pressure will be taken.  Your abdomen will be measured to track your baby's growth.  The fetal heartbeat will be listened to starting around week 10 or 12 of your pregnancy.  Test results from any previous visits will be discussed. Your health care provider may ask you:  How you are feeling.  If you are feeling the baby move.  If you have had any abnormal symptoms, such as leaking fluid, bleeding, severe headaches, or abdominal cramping.  If you have any questions. Other tests that may be performed during your first trimester include:  Blood tests to find your blood type and to check for the presence of any previous infections. They will also be used to check for low  iron levels (anemia) and Rh antibodies. Later in the pregnancy, blood tests for diabetes will be done along with other tests if problems develop.  Urine tests to check for infections, diabetes, or protein in the urine.  An ultrasound to confirm the proper growth and development of the baby.  An amniocentesis to check for possible genetic problems.  Fetal screens for spina bifida and Down syndrome.  You may need other tests to make sure you and the baby are doing well. HOME CARE INSTRUCTIONS  Medicines  Follow your health care provider's instructions regarding medicine use. Specific medicines may be either safe or unsafe to take during pregnancy.  Take your prenatal vitamins as directed.  If you develop constipation, try taking a stool softener if your health care provider approves. Diet  Eat regular, well-balanced meals. Choose a variety of foods, such as meat or vegetable-based protein, fish, milk and low-fat dairy products, vegetables,  fruits, and whole grain breads and cereals. Your health care provider will help you determine the amount of weight gain that is right for you.  Avoid raw meat and uncooked cheese. These carry germs that can cause birth defects in the baby.  Eating four or five small meals rather than three large meals a day may help relieve nausea and vomiting. If you start to feel nauseous, eating a few soda crackers can be helpful. Drinking liquids between meals instead of during meals also seems to help nausea and vomiting.  If you develop constipation, eat more high-fiber foods, such as fresh vegetables or fruit and whole grains. Drink enough fluids to keep your urine clear or pale yellow. Activity and Exercise  Exercise only as directed by your health care provider. Exercising will help you:  Control your weight.  Stay in shape.  Be prepared for labor and delivery.  Experiencing pain or cramping in the lower abdomen or low back is a good sign that you should stop exercising. Check with your health care provider before continuing normal exercises.  Try to avoid standing for long periods of time. Move your legs often if you must stand in one place for a long time.  Avoid heavy lifting.  Wear low-heeled shoes, and practice good posture.  You may continue to have sex unless your health care provider directs you otherwise. Relief of Pain or Discomfort  Wear a good support bra for breast tenderness.   Take warm sitz baths to soothe any pain or discomfort caused by hemorrhoids. Use hemorrhoid cream if your health care provider approves.   Rest with your legs elevated if you have leg cramps or low back pain.  If you develop varicose veins in your legs, wear support hose. Elevate your feet for 15 minutes, 3-4 times a day. Limit salt in your diet. Prenatal Care  Schedule your prenatal visits by the twelfth week of pregnancy. They are usually scheduled monthly at first, then more often in the last 2  months before delivery.  Write down your questions. Take them to your prenatal visits.  Keep all your prenatal visits as directed by your health care provider. Safety  Wear your seat belt at all times when driving.  Make a list of emergency phone numbers, including numbers for family, friends, the hospital, and police and fire departments. General Tips  Ask your health care provider for a referral to a local prenatal education class. Begin classes no later than at the beginning of month 6 of your pregnancy.  Ask for help if you have  counseling or nutritional needs during pregnancy. Your health care provider can offer advice or refer you to specialists for help with various needs.  Do not use hot tubs, steam rooms, or saunas.  Do not douche or use tampons or scented sanitary pads.  Do not cross your legs for long periods of time.  Avoid cat litter boxes and soil used by cats. These carry germs that can cause birth defects in the baby and possibly loss of the fetus by miscarriage or stillbirth.  Avoid all smoking, herbs, alcohol, and medicines not prescribed by your health care provider. Chemicals in these affect the formation and growth of the baby.  Schedule a dentist appointment. At home, brush your teeth with a soft toothbrush and be gentle when you floss. SEEK MEDICAL CARE IF:   You have dizziness.  You have mild pelvic cramps, pelvic pressure, or nagging pain in the abdominal area.  You have persistent nausea, vomiting, or diarrhea.  You have a bad smelling vaginal discharge.  You have pain with urination.  You notice increased swelling in your face, hands, legs, or ankles. SEEK IMMEDIATE MEDICAL CARE IF:   You have a fever.  You are leaking fluid from your vagina.  You have spotting or bleeding from your vagina.  You have severe abdominal cramping or pain.  You have rapid weight gain or loss.  You vomit blood or material that looks like coffee grounds.  You  are exposed to [place] measles and have never had them.  You are exposed to fifth disease or chickenpox.  You develop a severe headache.  You have shortness of breath.  You have any kind of trauma, such as from a fall or a car accident. Document Released: 08/14/2001 Document Revised: 01/04/2014 Document Reviewed: 06/30/2013 [facility] Patient Information 2015 [place], [place]. This information is not intended to replace advice given to you by your health care provider. Make sure you discuss any questions you have with your health care provider.

## 2014-05-31 NOTE — MAU Note (Signed)
Pt presents to MAU with complaints of having vomiting since Thursday. Reports her last menstrual cycle was last month.

## 2014-05-31 NOTE — MAU Provider Note (Signed)
Attestation of Attending Supervision of Advanced Practitioner (PA/CNM/NP): Evaluation and management procedures were performed by the Advanced Practitioner under my supervision and collaboration.  I have reviewed the Advanced Practitioner's note and chart, and I agree with the management and plan.  Medard Decuir, DO Attending Physician Faculty Practice, [facility]  

## 2014-06-01 LAB — URINE CULTURE
Colony Count: 25000
Special Requests: NORMAL

## 2014-06-03 ENCOUNTER — Ambulatory Visit: Payer: No Typology Code available for payment source | Admitting: Gynecology

## 2014-07-05 ENCOUNTER — Encounter (HOSPITAL_COMMUNITY): Payer: Self-pay | Admitting: *Deleted

## 2014-07-20 ENCOUNTER — Ambulatory Visit: Payer: Self-pay | Admitting: Gynecology

## 2014-08-02 ENCOUNTER — Ambulatory Visit (INDEPENDENT_AMBULATORY_CARE_PROVIDER_SITE_OTHER): Payer: Self-pay | Admitting: Gynecology

## 2014-08-02 ENCOUNTER — Encounter: Payer: Self-pay | Admitting: Gynecology

## 2014-08-02 VITALS — BP 130/84 | Ht 63.0 in | Wt 200.0 lb

## 2014-08-02 DIAGNOSIS — N912 Amenorrhea, unspecified: Secondary | ICD-10-CM

## 2014-08-02 LAB — PREGNANCY, URINE: Preg Test, Ur: NEGATIVE

## 2014-08-02 MED ORDER — MEDROXYPROGESTERONE ACETATE 10 MG PO TABS: 10.0000 mg | ORAL_TABLET | Freq: Every day | ORAL | Status: DC

## 2014-08-02 NOTE — Progress Notes (Signed)
Misty [name] 03/28/1991 [id number]        23 y.o.  G1P0010 Presents having not been seen for a number of years complaining of no menses over the past 4 months. Previously was having regular menses. Is sexually active using condoms. Does have a history of SAB several years ago. Had been on Depo-Provera in the past but has not been on this for a number of years. Is being followed for hypertension and diabetes.  No weight gain or weight loss. No hair or skin changes. No nipple discharge or breast tenderness.  Past medical history,surgical history, problem list, medications, allergies, family history and social history were all reviewed and documented in the EPIC chart.  Directed ROS with pertinent positives and negatives documented in the history of present illness/assessment and plan.  Exam: Kim assistant General appearance:  Normal Abdomen soft nontender without masses guarding rebound organomegaly. Pelvic external BUS vagina normal. Cervix grossly normal. Bimanual uterus normal size midline mobile nontender. Adnexa without masses or tenderness  Assessment/Plan:  23 y.o. G1P0010 with 4 months of amenorrhea. Check baseline labs to include hCG TSH FSH prolactin. Progesterone withdrawal with medroxyprogesterone 10 mg daily 10 days. Call if no vaginal bleeding. Will make an appointment in January/February when she has insurance for full exam to include Pap smear STD screening. Discussed contraceptive options with her today. She is interested in starting Depo-Provera and will make arrangements for this once we our sure that her labs are normal and that she has a withdrawal bleed.     Dara LordsFONTAINE,Pattijo Juste P MD, 3:16 PM 08/02/2014

## 2014-08-02 NOTE — Patient Instructions (Addendum)
Take the prescribed pill medroxyprogesterone daily for 10 days. This should bring on a period. Call if you do not have a period after taking the medication.  Follow up with me in January or February for your full exam.

## 2014-08-02 NOTE — Addendum Note (Signed)
Addended by: Dayna BarkerGARDNER, KIMBERLY K on: 08/02/2014 03:54 PM   Modules accepted: Orders

## 2014-08-03 LAB — PROLACTIN: PROLACTIN: 24.7 ng/mL

## 2014-08-03 LAB — HCG, SERUM, QUALITATIVE: PREG SERUM: NEGATIVE

## 2014-08-03 LAB — FOLLICLE STIMULATING HORMONE: FSH: 4.5 m[IU]/mL

## 2014-08-03 LAB — TSH: TSH: 1.897 u[IU]/mL (ref 0.350–4.500)

## 2014-09-17 ENCOUNTER — Encounter: Payer: Self-pay | Admitting: Medical

## 2014-09-17 ENCOUNTER — Other Ambulatory Visit (INDEPENDENT_AMBULATORY_CARE_PROVIDER_SITE_OTHER): Payer: 59

## 2014-09-17 DIAGNOSIS — Z23 Encounter for immunization: Secondary | ICD-10-CM

## 2014-09-28 ENCOUNTER — Encounter: Payer: Self-pay | Admitting: Gynecology

## 2014-09-29 ENCOUNTER — Other Ambulatory Visit: Payer: Self-pay | Admitting: Family Medicine

## 2014-09-29 MED ORDER — CANAGLIFLOZIN-METFORMIN HCL 50-1000 MG PO TABS: 1.0000 | ORAL_TABLET | Freq: Two times a day (BID) | ORAL | Status: DC

## 2014-10-13 ENCOUNTER — Encounter: Payer: 59 | Admitting: Medical

## 2014-11-02 ENCOUNTER — Other Ambulatory Visit (HOSPITAL_COMMUNITY)
Admission: RE | Admit: 2014-11-02 | Discharge: 2014-11-02 | Disposition: A | Discharge status: Home / Self Care | Payer: 59 | Source: Ambulatory Visit | Attending: Gynecology | Admitting: Gynecology

## 2014-11-02 ENCOUNTER — Encounter: Payer: Self-pay | Admitting: Gynecology

## 2014-11-02 ENCOUNTER — Ambulatory Visit (INDEPENDENT_AMBULATORY_CARE_PROVIDER_SITE_OTHER): Payer: 59 | Admitting: Gynecology

## 2014-11-02 VITALS — BP 120/78 | Ht 64.0 in | Wt 197.0 lb

## 2014-11-02 DIAGNOSIS — Z01419 Encounter for gynecological examination (general) (routine) without abnormal findings: Secondary | ICD-10-CM | POA: Diagnosis not present

## 2014-11-02 DIAGNOSIS — Z113 Encounter for screening for infections with a predominantly sexual mode of transmission: Secondary | ICD-10-CM

## 2014-11-02 DIAGNOSIS — Z1151 Encounter for screening for human papillomavirus (HPV): Secondary | ICD-10-CM | POA: Insufficient documentation

## 2014-11-02 NOTE — Patient Instructions (Signed)
Follow up one year. Starts to get your first Depo-Provera shot. Then continue every 3 months for contraception.

## 2014-11-02 NOTE — Progress Notes (Signed)
Misty [name] 11/29/1990 [id number]        24 y.o.  G1P0010 for annual exam.  Several issues noted below.  Past medical history,surgical history, problem list, medications, allergies, family history and social history were all reviewed and documented as reviewed in the EPIC chart.  ROS:  Performed with pertinent positives and negatives included in the history, assessment and plan.   Additional significant findings :  none   Exam: [facility] personassistant Filed Vitals:   11/02/14 1513  BP: 120/78  Height: 5\' 4"  (1.626 m)  Weight: 197 lb (89.359 kg)   General appearance:  Normal affect, orientation and appearance. Skin: Grossly normal HEENT: Without gross lesions.  No cervical or supraclavicular adenopathy. Thyroid normal.  Lungs:  Clear without wheezing, rales or rhonchi Cardiac: RR, without RMG Abdominal:  Soft, nontender, without masses, guarding, rebound, organomegaly or hernia Breasts:  Examined lying and sitting without masses, retractions, discharge or axillary adenopathy. Pelvic:  Ext/BUS/vagina normal  Cervix normal. Pap smear done. GC/Chlamydia  Uterus axial to anteverted, normal size, shape and contour, midline and mobile nontender   Adnexa  Without masses or tenderness    Anus and perineum  Normal     Assessment/Plan:  24 y.o. 91P0010 female for annual exam with regular menses, condom contraception.   1. Contraceptive management. I reviewed contraceptive options with the patient. Patient wants to start Depo-Provera. We discussed this at her visit in November but she has not followed up until now. She had used Depo-Provera in the past with good results. Recommend patient follow up with the onset of her next menses to receive her first Depo-Provera 150 mg and to continue every 3 months. 2. First Pap smear done today. 3. STD screening. GC/Chlamydia screen done. Patient declines need for serum screening. Need to continue condoms regardless of using Depo-Provera to help decrease STD  risk. 4. Breast health. SBE monthly reviewed. 5. Gardasil series reportedly received 3. 6. Health maintenance. Actively sees Dr. Aleen Campiysinger and no routine blood work done today. Follow up for Depo-Provera every 3 months for this otherwise 1 year for annual exam.     Misty [name],Misty [name], 3:37 PM 11/02/2014

## 2014-11-02 NOTE — Addendum Note (Signed)
Addended by: Dayna BarkerGARDNER, Jamaul Heist K on: 11/02/2014 03:57 PM   Modules accepted: Orders

## 2014-11-03 LAB — GC/CHLAMYDIA PROBE AMP
CT PROBE, AMP APTIMA: NEGATIVE
GC PROBE AMP APTIMA: NEGATIVE

## 2014-11-04 LAB — CYTOLOGY - PAP

## 2014-11-16 ENCOUNTER — Other Ambulatory Visit: Payer: 59

## 2014-11-16 ENCOUNTER — Telehealth: Payer: Self-pay | Admitting: *Deleted

## 2014-11-16 MED ORDER — MEDROXYPROGESTERONE ACETATE 150 MG/ML IM SUSP: 150.0000 mg | INTRAMUSCULAR | Status: DC

## 2014-11-16 NOTE — Telephone Encounter (Signed)
Rx sent 

## 2014-11-16 NOTE — Telephone Encounter (Signed)
Per note on 11/02/14 "Recommend patient follow up with the onset of her next menses to receive her first Depo-Provera 150 mg and to continue every 3 months." Rx will be sent

## 2014-11-16 NOTE — Telephone Encounter (Signed)
-----   Message from Jerilynn Mageslaudia Shaffer sent at 11/16/2014  4:09 PM EDT ----- Regarding: RX Patient states [facility] does not have [facility] so she has rescheduled for tomorrow but She now [facility] to [facility] [address]

## 2014-11-17 ENCOUNTER — Other Ambulatory Visit: Payer: 59

## 2014-11-18 ENCOUNTER — Other Ambulatory Visit: Payer: 59

## 2014-11-19 ENCOUNTER — Other Ambulatory Visit (INDEPENDENT_AMBULATORY_CARE_PROVIDER_SITE_OTHER): Payer: 59 | Admitting: Gynecology

## 2014-11-19 DIAGNOSIS — Z3042 Encounter for surveillance of injectable contraceptive: Secondary | ICD-10-CM | POA: Diagnosis not present

## 2014-11-19 MED ORDER — MEDROXYPROGESTERONE ACETATE 150 MG/ML IM SUSP
150.0000 mg | Freq: Once | INTRAMUSCULAR | Status: AC
  Administered 2014-11-19: 150 mg via INTRAMUSCULAR

## 2015-02-18 ENCOUNTER — Ambulatory Visit (INDEPENDENT_AMBULATORY_CARE_PROVIDER_SITE_OTHER): Payer: 59 | Admitting: Gynecology

## 2015-02-18 DIAGNOSIS — Z3042 Encounter for surveillance of injectable contraceptive: Secondary | ICD-10-CM | POA: Diagnosis not present

## 2015-02-18 MED ORDER — MEDROXYPROGESTERONE ACETATE 150 MG/ML IM SUSP
150.0000 mg | Freq: Once | INTRAMUSCULAR | Status: AC
  Administered 2015-02-18: 150 mg via INTRAMUSCULAR

## 2015-04-14 ENCOUNTER — Telehealth: Payer: Self-pay | Admitting: Medical

## 2015-04-14 NOTE — Telephone Encounter (Signed)
Recv'd faxed request for forms to be completed for [employer].  Called pt & advised that she is no longer a pt here and we were unable to complete the forms.  We can send copies of a couple office visits and she states that is fine.

## 2015-06-06 ENCOUNTER — Ambulatory Visit: Payer: 59

## 2015-06-21 ENCOUNTER — Encounter (HOSPITAL_COMMUNITY): Payer: Self-pay | Admitting: Emergency Medicine

## 2015-06-21 ENCOUNTER — Emergency Department (HOSPITAL_COMMUNITY)
Admission: EM | Admit: 2015-06-21 | Discharge: 2015-06-21 | Disposition: A | Discharge status: Home / Self Care | Payer: 59 | Attending: Emergency Medicine | Admitting: Emergency Medicine

## 2015-06-21 DIAGNOSIS — Z872 Personal history of diseases of the skin and subcutaneous tissue: Secondary | ICD-10-CM | POA: Insufficient documentation

## 2015-06-21 DIAGNOSIS — D649 Anemia, unspecified: Secondary | ICD-10-CM | POA: Insufficient documentation

## 2015-06-21 DIAGNOSIS — I159 Secondary hypertension, unspecified: Secondary | ICD-10-CM | POA: Insufficient documentation

## 2015-06-21 DIAGNOSIS — Z79899 Other long term (current) drug therapy: Secondary | ICD-10-CM | POA: Insufficient documentation

## 2015-06-21 DIAGNOSIS — E1165 Type 2 diabetes mellitus with hyperglycemia: Secondary | ICD-10-CM | POA: Insufficient documentation

## 2015-06-21 DIAGNOSIS — J45909 Unspecified asthma, uncomplicated: Secondary | ICD-10-CM | POA: Insufficient documentation

## 2015-06-21 DIAGNOSIS — Z87891 Personal history of nicotine dependence: Secondary | ICD-10-CM | POA: Insufficient documentation

## 2015-06-21 DIAGNOSIS — R739 Hyperglycemia, unspecified: Secondary | ICD-10-CM

## 2015-06-21 DIAGNOSIS — Z3202 Encounter for pregnancy test, result negative: Secondary | ICD-10-CM | POA: Insufficient documentation

## 2015-06-21 LAB — URINALYSIS, ROUTINE W REFLEX MICROSCOPIC
BILIRUBIN URINE: NEGATIVE
HGB URINE DIPSTICK: NEGATIVE
KETONES UR: NEGATIVE mg/dL
Leukocytes, UA: NEGATIVE
Nitrite: NEGATIVE
Protein, ur: NEGATIVE mg/dL
SPECIFIC GRAVITY, URINE: 1.042 — AB (ref 1.005–1.030)
Urobilinogen, UA: 0.2 mg/dL (ref 0.0–1.0)
pH: 5.5 (ref 5.0–8.0)

## 2015-06-21 LAB — I-STAT CHEM 8, ED
BUN: 10 mg/dL (ref 6–20)
CHLORIDE: 99 mmol/L — AB (ref 101–111)
Calcium, Ion: 1.25 mmol/L — ABNORMAL HIGH (ref 1.12–1.23)
Creatinine, Ser: 0.8 mg/dL (ref 0.44–1.00)
Glucose, Bld: 329 mg/dL — ABNORMAL HIGH (ref 65–99)
HCT: 44 % (ref 36.0–46.0)
Hemoglobin: 15 g/dL (ref 12.0–15.0)
Potassium: 4.3 mmol/L (ref 3.5–5.1)
Sodium: 136 mmol/L (ref 135–145)
TCO2: 23 mmol/L (ref 0–100)

## 2015-06-21 LAB — URINE MICROSCOPIC-ADD ON

## 2015-06-21 LAB — POC URINE PREG, ED: Preg Test, Ur: NEGATIVE

## 2015-06-21 LAB — PREGNANCY, URINE: PREG TEST UR: NEGATIVE

## 2015-06-21 MED ORDER — HYDROCHLOROTHIAZIDE 25 MG PO TABS: 25.0000 mg | ORAL_TABLET | Freq: Every day | ORAL | Status: DC

## 2015-06-21 MED ORDER — CANAGLIFLOZIN-METFORMIN HCL 50-1000 MG PO TABS: 1.0000 | ORAL_TABLET | Freq: Two times a day (BID) | ORAL | Status: DC

## 2015-06-21 NOTE — ED Provider Notes (Signed)
CSN: [id number]     Arrival date & time 06/21/15  0945 History   First MD Initiated Contact with Patient 06/21/15 [phone]     Chief Complaint  Patient presents with  . Hypertension     (Consider location/radiation/quality/duration/timing/severity/associated sxs/prior Treatment) HPI Comments: 24 y.o. Female with history of HTN, DM presents for elevated blood pressure. The patient stated that she has never been on any blood pressure medicines and has not been diagnosed with HTN herself but that her family has a history of HTN and so she checks her blood pressure at home and had been finding it to be elevated all the way up to 200 systolic.  She denies any other symptoms and says she otherwise feels fine.  No CP, shortness of breath, leg swelling, palpitations, headache, nausea, dizziness, change in urination.  Denies pregnancy.  When reviewing patient records it was noted that patient had previously been on HCTZ through her PCP outpatient.  She states she did not remember that and has not been taking any medications.  She stopped following with her PCP because she does not have insurance.  Patient is a 24 y.o. female presenting with hypertension.  Hypertension Pertinent negatives include no chest pain, no headaches and no shortness of breath.    Past Medical History  Diagnosis Date  . Allergy   . Diabetes mellitus without complication (HCC) 2015  . Anemia 2015  . Hypertension 09/2013  . Asthma     as child  . Eczema    Past Surgical History  Procedure Laterality Date  . No past surgeries     Family History  Problem Relation Age of Onset  . Hypertension Mother   . Heart disease Mother 2140    CHF  . Diabetes Mother   . Sickle cell anemia Brother   . Hypertension Maternal Grandfather   . Diabetes Maternal Grandfather   . Diabetes Maternal Grandmother    Social History  Substance Use Topics  . Smoking status: Former Smoker -- 0.50 packs/day for 2 years    Quit date: 09/08/2013  .  Smokeless tobacco: Never Used  . Alcohol Use: No   OB History    Gravida Para Term Preterm AB TAB SAB Ectopic Multiple Living   1 0 0 0 1 0 1 0 0 0      Review of Systems  Constitutional: Negative for activity change, appetite change and fatigue.  HENT: Negative for congestion and postnasal drip.   Eyes: Negative for pain and visual disturbance.  Respiratory: Negative for cough, chest tightness and shortness of breath.   Cardiovascular: Negative for chest pain, palpitations and leg swelling.  Genitourinary: Negative for dysuria, urgency and flank pain.  Musculoskeletal: Negative for myalgias and back pain.  Skin: Negative for rash.  Neurological: Negative for dizziness, seizures, weakness, light-headedness, numbness and headaches.  Hematological: Does not bruise/bleed easily.      Allergies  Iodides; Morphine and related; and Shellfish allergy  Home Medications   Prior to Admission medications   Medication Sig Start Date End Date Taking? Authorizing Provider  Canagliflozin-Metformin HCl (INVOKAMET) 50-1000 MG TABS Take 1 tablet by mouth 2 (two) times daily. 06/21/15   Leta BaptistEmily Roe Nguyen, MD  ferrous gluconate (FERGON) 324 MG tablet Take 1 tablet (324 mg total) by mouth 2 (two) times daily with a meal. 12/15/13   Kermit Baloavid S Tysinger, PA-C  hydrochlorothiazide (HYDRODIURIL) 25 MG tablet Take 1 tablet (25 mg total) by mouth daily. 06/21/15   Leta BaptistEmily Roe Nguyen, MD  medroxyPROGESTERone (DEPO-PROVERA) 150 MG/ML injection Inject 1 mL (150 mg total) into the muscle every 3 (three) months. 11/16/14   Dara Lords, MD   BP 113/76 mmHg  Pulse 74  Temp(Src) 99.4 F (37.4 C)  Resp 16  SpO2 100%  LMP 06/21/2015 (Exact Date) Physical Exam  Constitutional: She is oriented to person, place, and time. She appears well-developed and well-nourished. No distress.  HENT:  Head: Normocephalic and atraumatic.  Right Ear: External ear normal.  Left Ear: External ear normal.  Nose: Nose normal.   Mouth/Throat: Oropharynx is clear and moist. No oropharyngeal exudate.  Eyes: EOM are normal. Pupils are equal, round, and reactive to light.  Neck: Normal range of motion. Neck supple.  Cardiovascular: Normal rate, regular rhythm, normal heart sounds and intact distal pulses.   No murmur heard. Pulmonary/Chest: Effort normal. No respiratory distress. She has no wheezes. She has no rales.  Abdominal: Soft. She exhibits no distension. There is no tenderness.  Musculoskeletal: Normal range of motion. She exhibits no edema or tenderness.  Neurological: She is alert and oriented to person, place, and time.  Skin: Skin is warm and dry. No rash noted. She is not diaphoretic.  Vitals reviewed.   ED Course  Procedures (including critical care time) Labs Review Labs Reviewed  URINALYSIS, ROUTINE W REFLEX MICROSCOPIC (NOT AT [facility]) - Abnormal; Notable for the following:    Specific Gravity, Urine 1.042 (*)    Glucose, UA >1000 (*)    All other components within normal limits  I-STAT CHEM 8, ED - Abnormal; Notable for the following:    Chloride 99 (*)    Glucose, Bld 329 (*)    Calcium, Ion 1.25 (*)    All other components within normal limits  PREGNANCY, URINE  URINE MICROSCOPIC-ADD ON  POC URINE PREG, ED    Imaging Review No results found. I have personally reviewed and evaluated these images and lab results as part of my medical decision-making.   EKG Interpretation None      MDM  Patient was seen and evaluated in stable condition.  Patient presenting for asymptomatic HTN.  When reviewing the patient's chart patient noted to have been diagnosed with DM and HTN in the past and was supposed to be on HCTZ and Metformin.  UA with large amount of glucose, no protein.  No acidemia on chemistry.  HCG negative.  Results and clinical impression discussed at length with patient.  BP benign in the ER.  Patient was provided with prescriptions for her HCTZ and Metformin and given instruction to  follow up with her PCP or with a PCP from the resource list provided.  Patient was discharged home in stable condition with strict return precautions.  All questions answered prior to discharge. Final diagnoses:  Secondary hypertension, unspecified  Hyperglycemia    1. Uncontrolled HTN  2. Uncontrolled DM    Leta Baptist, MD 06/21/15 2117

## 2015-06-21 NOTE — ED Notes (Signed)
Pt from home with c/o high blood pressure x several days with taking at home.  Reports hx of family with early high blood pressure.  No associated symptoms.  NAD, A&O.

## 2015-06-21 NOTE — Discharge Instructions (Signed)
You were seen today for your high blood pressure and high blood sugar.  You need to take your home medications and follow up with your primary care physician.  You have been provided with prescriptions for your blood pressure medication and diabetes medication.  Take them as prescribed.    Hypertension Hypertension, commonly called high blood pressure, is when the force of blood pumping through your arteries is too strong. Your arteries are the blood vessels that carry blood from your heart throughout your body. A blood pressure reading consists of a higher number over a lower number, such as 110/72. The higher number (systolic) is the pressure inside your arteries when your heart pumps. The lower number (diastolic) is the pressure inside your arteries when your heart relaxes. Ideally you want your blood pressure below 120/80. Hypertension forces your heart to work harder to pump blood. Your arteries may become narrow or stiff. Having untreated or uncontrolled hypertension can cause heart attack, stroke, kidney disease, and other problems. RISK FACTORS Some risk factors for high blood pressure are controllable. Others are not.  Risk factors you cannot control include:   Race. You may be at higher risk if you are African American.  Age. Risk increases with age.  Gender. Men are at higher risk than women before age 4 years. After age 34, women are at higher risk than men. Risk factors you can control include:  Not getting enough exercise or physical activity.  Being overweight.  Getting too much fat, sugar, calories, or salt in your diet.  Drinking too much alcohol. SIGNS AND SYMPTOMS Hypertension does not usually cause signs or symptoms. Extremely high blood pressure (hypertensive crisis) may cause headache, anxiety, shortness of breath, and nosebleed. DIAGNOSIS To check if you have hypertension, your health care provider will measure your blood pressure while you are seated, with your arm  held at the level of your heart. It should be measured at least twice using the same arm. Certain conditions can cause a difference in blood pressure between your right and left arms. A blood pressure reading that is higher than normal on one occasion does not mean that you need treatment. If it is not clear whether you have high blood pressure, you may be asked to return on a different day to have your blood pressure checked again. Or, you may be asked to monitor your blood pressure at home for 1 or more weeks. TREATMENT Treating high blood pressure includes making lifestyle changes and possibly taking medicine. Living a healthy lifestyle can help lower high blood pressure. You may need to change some of your habits. Lifestyle changes may include:  Following the DASH diet. This diet is high in fruits, vegetables, and whole grains. It is low in salt, red meat, and added sugars.  Keep your sodium intake below 2,300 mg per day.  Getting at least 30-45 minutes of aerobic exercise at least 4 times per week.  Losing weight if necessary.  Not smoking.  Limiting alcoholic beverages.  Learning ways to reduce stress. Your health care provider may prescribe medicine if lifestyle changes are not enough to get your blood pressure under control, and if one of the following is true:  You are 65-71 years of age and your systolic blood pressure is above 140.  You are 61 years of age or older, and your systolic blood pressure is above 150.  Your diastolic blood pressure is above 90.  You have diabetes, and your systolic blood pressure is over 140  or your diastolic blood pressure is over 90.  You have kidney disease and your blood pressure is above 140/90.  You have heart disease and your blood pressure is above 140/90. Your personal target blood pressure may vary depending on your medical conditions, your age, and other factors. HOME CARE INSTRUCTIONS  Have your blood pressure rechecked as directed  by your health care provider.   Take medicines only as directed by your health care provider. Follow the directions carefully. Blood pressure medicines must be taken as prescribed. The medicine does not work as well when you skip doses. Skipping doses also puts you at risk for problems.  Do not smoke.   Monitor your blood pressure at home as directed by your health care provider. SEEK MEDICAL CARE IF:   You think you are having a reaction to medicines taken.  You have recurrent headaches or feel dizzy.  You have swelling in your ankles.  You have trouble with your vision. SEEK IMMEDIATE MEDICAL CARE IF:  You develop a severe headache or confusion.  You have unusual weakness, numbness, or feel faint.  You have severe chest or abdominal pain.  You vomit repeatedly.  You have trouble breathing. MAKE SURE YOU:   Understand these instructions.  Will watch your condition.  Will get help right away if you are not doing well or get worse.   This information is not intended to replace advice given to you by your health care provider. Make sure you discuss any questions you have with your health care provider.   Document Released: 08/20/2005 Document Revised: 01/04/2015 Document Reviewed: 06/12/2013 Elsevier Interactive Patient Education 2016 Elsevier Inc.  Hyperglycemia Hyperglycemia occurs when the glucose (sugar) in your blood is too high. Hyperglycemia can happen for many reasons, but it most often happens to people who do not know they have diabetes or are not managing their diabetes properly.  CAUSES  Whether you have diabetes or not, there are other causes of hyperglycemia. Hyperglycemia can occur when you have diabetes, but it can also occur in other situations that you might not be as aware of, such as: Diabetes  If you have diabetes and are having problems controlling your blood glucose, hyperglycemia could occur because of some of the following reasons:  Not  following your meal plan.  Not taking your diabetes medications or not taking it properly.  Exercising less or doing less activity than you normally do.  Being sick. Pre-diabetes  This cannot be ignored. Before people develop Type 2 diabetes, they almost always have "pre-diabetes." This is when your blood glucose levels are higher than normal, but not yet high enough to be diagnosed as diabetes. Research has shown that some long-term damage to the body, especially the heart and circulatory system, may already be occurring during pre-diabetes. If you take action to manage your blood glucose when you have pre-diabetes, you may delay or prevent Type 2 diabetes from developing. Stress  If you have diabetes, you may be "diet" controlled or on oral medications or insulin to control your diabetes. However, you may find that your blood glucose is higher than usual in the hospital whether you have diabetes or not. This is often referred to as "stress hyperglycemia." Stress can elevate your blood glucose. This happens because of hormones put out by the body during times of stress. If stress has been the cause of your high blood glucose, it can be followed regularly by your caregiver. That way he/she can make sure your hyperglycemia  does not continue to get worse or progress to diabetes. Steroids  Steroids are medications that act on the infection fighting system (immune system) to block inflammation or infection. One side effect can be a rise in blood glucose. Most people can produce enough extra insulin to allow for this rise, but for those who cannot, steroids make blood glucose levels go even higher. It is not unusual for steroid treatments to "uncover" diabetes that is developing. It is not always possible to determine if the hyperglycemia will go away after the steroids are stopped. A special blood test called an A1c is sometimes done to determine if your blood glucose was elevated before the steroids were  started. SYMPTOMS  Thirsty.  Frequent urination.  Dry mouth.  Blurred vision.  Tired or fatigue.  Weakness.  Sleepy.  Tingling in feet or leg. DIAGNOSIS  Diagnosis is made by monitoring blood glucose in one or all of the following ways:  A1c test. This is a chemical found in your blood.  Fingerstick blood glucose monitoring.  Laboratory results. TREATMENT  First, knowing the cause of the hyperglycemia is important before the hyperglycemia can be treated. Treatment may include, but is not be limited to:  Education.  Change or adjustment in medications.  Change or adjustment in meal plan.  Treatment for an illness, infection, etc.  More frequent blood glucose monitoring.  Change in exercise plan.  Decreasing or stopping steroids.  Lifestyle changes. HOME CARE INSTRUCTIONS   Test your blood glucose as directed.  Exercise regularly. Your caregiver will give you instructions about exercise. Pre-diabetes or diabetes which comes on with stress is helped by exercising.  Eat wholesome, balanced meals. Eat often and at regular, fixed times. Your caregiver or nutritionist will give you a meal plan to guide your sugar intake.  Being at an ideal weight is important. If needed, losing as little as 10 to 15 pounds may help improve blood glucose levels. SEEK MEDICAL CARE IF:   You have questions about medicine, activity, or diet.  You continue to have symptoms (problems such as increased thirst, urination, or weight gain). SEEK IMMEDIATE MEDICAL CARE IF:   You are vomiting or have diarrhea.  Your breath smells fruity.  You are breathing faster or slower.  You are very sleepy or incoherent.  You have numbness, tingling, or pain in your feet or hands.  You have chest pain.  Your symptoms get worse even though you have been following your caregiver's orders.  If you have any other questions or concerns.   This information is not intended to replace advice  given to you by your health care provider. Make sure you discuss any questions you have with your health care provider.   Document Released: 02/13/2001 Document Revised: 11/12/2011 Document Reviewed: 04/26/2015 Elsevier Interactive Patient Education 2016 [employer].   Emergency Department Resource Guide 1) Find a Doctor and Pay Out of Pocket Although you won't have to find out who is covered by your insurance plan, it is a good idea to ask around and get recommendations. You will then need to call the office and see if the doctor you have chosen will accept you as a new patient and what types of options they offer for patients who are self-pay. Some doctors offer discounts or will set up payment plans for their patients who do not have insurance, but you will need to ask so you aren't surprised when you get to your appointment.  2) Contact Your Local Health Department  Not all health departments have doctors that can see patients for sick visits, but many do, so it is worth a call to see if yours does. If you don't know where your local health department is, you can check in your phone book. The CDC also has a tool to help you locate your state's health department, and many state websites also have listings of all of their local health departments.  3) Find a Walk-in Clinic If your illness is not likely to be very severe or complicated, you may want to try a walk in clinic. These are popping up all over the country in pharmacies, drugstores, and shopping centers. They're usually staffed by nurse practitioners or physician assistants that have been trained to treat common illnesses and complaints. They're usually fairly quick and inexpensive. However, if you have serious medical issues or chronic medical problems, these are probably not your best option.  No Primary Care Doctor: - Call Health Connect at  [phone] - they can help you locate a primary care doctor that  accepts your insurance,  provides certain services, etc. - Physician Referral Service- [phone]  Chronic Pain Problems: Organization         Address  Phone   Notes  [facility]  [phone] Patients need to be referred by their primary care doctor.   Medication Assistance: Organization         Address  Phone   Notes  [facility] Medication [facility] [address] [place] [place], [place] [address] [phone] --Must be a resident of [facility] -- Must have NO insurance coverage whatsoever (no Medicaid/ Medicare, etc.) -- The pt. MUST have a primary care doctor that directs their care regularly and follows them in the community   [facility]  [phone]   [employer]  [phone]    Agencies that provide inexpensive medical care: Organization         Address  Phone   Notes  [facility]  [phone]   [facility]    [phone]   [facility] [address] [place], [place] [phone]   [facility] [address], [place] [phone]   Planned Parenthood    [phone]   [facility]    [phone]   Community Health and [facility]  [address], [place] Phone:  [phone], Fax:  [phone] Hours of Operation:  9 am - 6 pm, M-F.  Also accepts Medicaid/Medicare and self-pay.  [facility] for Children  [address], [place] Phone: [phone], Fax: [phone]. Hours of Operation:  8:30 am - 5:30 pm, M-F.  Also accepts Medicaid and self-pay.  [facility] [place] [address], [place] Phone: [phone]   [facility] [address] Natasha Bence [place], [place] [phone], Ext. [phone] Mondays & Thursdays: 7-9 AM.  First 15 patients are seen on a first come, first serve basis.    Medicaid-accepting [facility] Providers:  Organization         Address  Phone    Notes  [facility] [address], [place] [phone] Also accepts self-pay patients.  [facility] [address] Laurell Josephs [place], [place]  [phone]   [facility] [address], [place] [phone]  [facility] [address], [place] [phone]   Renaye Rakers [address], [place]   [phone] Only accepts [place] patients after they have their name applied to their card.   Self-Pay (no insurance) in [facility]:  Organization         Address  Phone   Notes  Sickle Cell Patients, [facility] [address] [place], [place] [phone]   [facility] [address] [place], [place] [phone]   [facility]  [address], [place] [phone]   [facility]/Dr. Osei-Bonsu  [address], [place] or [address], [place] [phone] Phone number for both [place] and [place] locations is the same.  [facility] [address] [place] [phone]   [facility] [address], [place] or [address] [phone]   [facility] [address] [place] [phone], phone; [phone], fax Sees patients 1st and 3rd Saturday of every month.  Must not qualify for public or private insurance (i.e. Medicaid, Medicare, [place] Health Choice, Veterans' Benefits)  Household income should be no more than 200% of the poverty level The clinic cannot treat you if you are pregnant or think you are pregnant  Sexually transmitted diseases are not treated at the clinic.    Dental Care: Organization         Address  Phone  Notes  [facility] Department of [facility] [address] [place], [place] [phone] Accepts children up to age 91 who are enrolled in [place] or [place] Health Choice; pregnant women with a Medicaid card; and children who have applied for Medicaid or [place] Health Choice, but were declined, whose parents can pay a reduced fee at time of service.  [facility] Department of [facility]  [address], [place] [phone] Accepts children up to age 42 who are enrolled in [place] or [place] Health Choice; pregnant women with a Medicaid card; and children who have applied for Medicaid or [place] Health Choice, but were declined, whose parents can pay a reduced fee at time of service.  [facility]  [address] [place], [place] [phone] Patients are seen by appointment only. Walk-ins are not accepted. [facility] will see patients 58 years of age and older. Monday - Tuesday (8am-5pm) Most Wednesdays (8:30-5pm) $30 per visit, cash only  [facility] Adult [facility]  [address], [facility] [phone] Patients are seen by appointment only. Walk-ins are not accepted. [facility] will see patients 57 years of age and older. One Wednesday Evening (Monthly: Volunteer Based).  $30 per visit, cash only  [employer]  [phone] for adults; Children under age 79, call Graduate Pediatric Dentistry at [phone]. Children aged 68-14, please call [phone] to request a pediatric application.  Dental services are provided in all areas of dental care including fillings, crowns and bridges, complete and partial dentures, implants, gum treatment, root canals, and extractions. Preventive care is also provided. Treatment is provided to both adults and children. Patients are selected via a lottery and there is often a waiting list.   [facility] [address] [place]  [phone] [email or web address]  [facility] [address], [place], [place] [phone], Ext.  123 Second and Fourth Thursday of each month, opens at 6:30 AM; Clinic ends at 9 AM.  Patients are seen on a first-come first-served basis, and a limited number are seen during each clinic.   [facility]  [address], [place], [place] [phone]   Eligibility Requirements You must have lived in [place], [place], or [place] counties for at least the last three months.   You cannot be eligible for state or federal sponsored [place] Cityhealthcare insurance, including CIGNAVeterans Administration, [place]icaid, or [employer]icare.   You generally cannot be eligible for healthcare insurance through your employer.    How to apply: Eligibility screenings are held every Tuesday and Wednesday afternoon from 1:00 pm until 4:00 pm. You do not need an appointment for the interview!  [facility] [address], [place], [place] [phone]   [facility]  [phone]   [facility]  [phone]   [facility]  [phone]    Behavioral Health Resources in the Community: Intensive Outpatient Programs Organization         Address  Phone  Notes  [facility] [address], [place], [place] [phone]   [facility] [address], [place], [place] [phone]   [facility] [address], [place], [place]  [phone]   [facility] [address],  [place], [place] [phone] or [phone]   Substance Abuse Resources Organization         Address  Phone  Notes  [facility]  [phone]   [facility]  [phone]   [facility]  [phone]   [facility]  [phone]   Residential & Outpatient Substance Abuse Program  [phone]   Psychological Services Organization         Address  Phone  Notes  [facility]  [phone]- [phone]   [facility]  [phone]- [phone]   [facility] [address], [place] [phone] or [phone]    Mobile Crisis Teams Organization         Address  Phone  Notes  [facility], Mobile Crisis Care Unit  [phone]   [facility]  [address] [place], [place] [phone]   Doristine LocksSharon DeEsch [address] [place] [phone]    Self-Help/Support Groups Organization         Address  Phone             Notes  [facility] [place] - variety of support groups  [phone]- [phone] Call for more information  Narcotics Anonymous (NA), [facility] [address], [place]  2 meetings at this location   [facility]ian[facility]idential Treatment Programs Organization         Address  Phone  Notes  [facility] [address],    [place]  [phone]   [facility]  [address], [place] [phone], [place], [place] [phone]   [facility] [address], [place] [phone] Admissions: 8am-3pm M-F  [facility] [address]    [place], [place] [phone]   [facility] [address] [place] [address], [place], [place] [phone]   [facility] [address]  [place], [place] [phone]   [facility] - Intensive Outpatient [phone] [address] Laurell Joseph[address], [place], [place] [phone]   [facility] ([facility] [address] [place]  [place], [place] [phone] or [phone]   [facility] ([facility]) [address], [place], [place] [phone] Accepts Medicaid  [facility] [place] [address],  [place] [phone] Substance Abuse/Addiction Treatment   [facility] Organization         Address  Phone  Notes  [facility]  [phone]   Angie Fava, PhD [address] Ervin Knack [place], [place]   [phone] or [phone]   [facility]   [address] [place], [place] [phone]   [facility] [address], [place], [place] [phone] Insurance/Medicaid/sponsorship through [facility] and Families [address]                                    [place], [place] [phone] Therapy/tele-psych/case  [facility] [address], [place] [phone]    Dr. Lolly Mustache  [phone]   [facility]  [employer] [facility] Dept. 1) [address] [place] 2) [address], [place] 3)  [address], [place] [phone]  [phone]   [facility] Hotline [phone] or [phone] (After Hours)

## 2015-06-24 ENCOUNTER — Telehealth: Payer: Self-pay | Admitting: *Deleted

## 2015-06-24 NOTE — Telephone Encounter (Signed)
Message left

## 2015-06-24 NOTE — Telephone Encounter (Signed)
Pt received depo provera injection on 02/18/15, never came back in Sept for next injection. Pt asked when can she get next injection? Please advise

## 2015-06-25 ENCOUNTER — Emergency Department (HOSPITAL_COMMUNITY)
Admission: EM | Admit: 2015-06-25 | Discharge: 2015-06-25 | Disposition: A | Discharge status: Home / Self Care | Payer: 59 | Attending: Emergency Medicine | Admitting: Emergency Medicine

## 2015-06-25 ENCOUNTER — Encounter (HOSPITAL_COMMUNITY): Payer: Self-pay | Admitting: *Deleted

## 2015-06-25 DIAGNOSIS — I10 Essential (primary) hypertension: Secondary | ICD-10-CM | POA: Insufficient documentation

## 2015-06-25 DIAGNOSIS — Z87891 Personal history of nicotine dependence: Secondary | ICD-10-CM | POA: Insufficient documentation

## 2015-06-25 DIAGNOSIS — J45909 Unspecified asthma, uncomplicated: Secondary | ICD-10-CM | POA: Insufficient documentation

## 2015-06-25 DIAGNOSIS — D649 Anemia, unspecified: Secondary | ICD-10-CM | POA: Insufficient documentation

## 2015-06-25 DIAGNOSIS — Z872 Personal history of diseases of the skin and subcutaneous tissue: Secondary | ICD-10-CM | POA: Insufficient documentation

## 2015-06-25 DIAGNOSIS — J069 Acute upper respiratory infection, unspecified: Secondary | ICD-10-CM | POA: Insufficient documentation

## 2015-06-25 DIAGNOSIS — Z79899 Other long term (current) drug therapy: Secondary | ICD-10-CM | POA: Insufficient documentation

## 2015-06-25 DIAGNOSIS — E119 Type 2 diabetes mellitus without complications: Secondary | ICD-10-CM | POA: Insufficient documentation

## 2015-06-25 DIAGNOSIS — B9789 Other viral agents as the cause of diseases classified elsewhere: Secondary | ICD-10-CM

## 2015-06-25 MED ORDER — DEXTROMETHORPHAN-GUAIFENESIN 10-200 MG PO CAPS: 1.0000 | ORAL_CAPSULE | Freq: Two times a day (BID) | ORAL | Status: DC

## 2015-06-25 NOTE — ED Provider Notes (Signed)
CSN: [id number]     Arrival date & time 06/25/15  1114 History  By signing my name below, I, Misty [name], attest that this documentation has been prepared under the direction and in the presence of Teressa Lower, NP. Electronically Signed: Sonum [name], Neurosurgeon. 06/25/2015. 11:31 AM.    Chief Complaint  Patient presents with  . URI   The history is provided by the patient. No language interpreter was used.     HPI Comments: Misty [name] is a 24 y.o. female who presents to the Emergency Department complaining of a gradual onset, constant itchy throat that began last night. She has associated sinus congestion and cough which aggravates her throat. She has not taken any OTC medications for her symptoms. She reports sick contacts at home with similar symptoms. She denies fever, chills, trouble swallowing.   Past Medical History  Diagnosis Date  . Allergy   . Diabetes mellitus without complication (HCC) 2015  . Anemia 2015  . Hypertension 09/2013  . Asthma     as child  . Eczema    Past Surgical History  Procedure Laterality Date  . No past surgeries     Family History  Problem Relation Age of Onset  . Hypertension Mother   . Heart disease Mother 30    CHF  . Diabetes Mother   . Sickle cell anemia Brother   . Hypertension Maternal Grandfather   . Diabetes Maternal Grandfather   . Diabetes Maternal Grandmother    Social History  Substance Use Topics  . Smoking status: Former Smoker -- 0.50 packs/day for 2 years    Quit date: 09/08/2013  . Smokeless tobacco: Never Used  . Alcohol Use: No   OB History    Gravida Para Term Preterm AB TAB SAB Ectopic Multiple Living       Review of Systems  Constitutional: Negative for fever and chills.  HENT: Positive for congestion and sore throat ( itchy). Negative for trouble swallowing.   Respiratory: Positive for cough.       Allergies  Iodides; Morphine and related; and Shellfish allergy  Home  Medications   Prior to Admission medications   Medication Sig Start Date End Date Taking? Authorizing Provider  Canagliflozin-Metformin HCl (INVOKAMET) 50-1000 MG TABS Take 1 tablet by mouth 2 (two) times daily. 06/21/15   Leta Baptist, MD  ferrous gluconate (FERGON) 324 MG tablet Take 1 tablet (324 mg total) by mouth 2 (two) times daily with a meal. 12/15/13   Kermit Balo Tysinger, PA-C  hydrochlorothiazide (HYDRODIURIL) 25 MG tablet Take 1 tablet (25 mg total) by mouth daily. 06/21/15   Leta Baptist, MD  medroxyPROGESTERone (DEPO-PROVERA) 150 MG/ML injection Inject 1 mL (150 mg total) into the muscle every 3 (three) months. 11/16/14   Dara Lords, MD   BP 138/59 mmHg  Pulse 100  Temp(Src) 99.3 F (37.4 C) (Oral)  Resp 18  Ht  (1.6 m)  Wt 190 lb (86.183 kg)  BMI 33.67 kg/m2  SpO2 98%  LMP 06/03/2015 Physical Exam  Constitutional: She is oriented to person, place, and time. She appears well-developed and well-nourished.  HENT:  Head: Normocephalic and atraumatic.  Right Ear: External ear normal.  Left Ear: External ear normal.  Nose: Rhinorrhea present.  Mouth/Throat: Posterior oropharyngeal erythema present.  Cardiovascular: Normal rate.   Pulmonary/Chest: Effort normal.  Abdominal: She exhibits no distension.  Neurological: She is alert and oriented to  person, place, and time.  Skin: Skin is warm and dry.  Psychiatric: She has a normal mood and affect.  Nursing note and vitals reviewed.   ED Course  Procedures   DIAGNOSTIC STUDIES: Oxygen Saturation is 98% on RA, normal by my interpretation.    COORDINATION OF CARE: 11:37 AM Discussed treatment plan with pt at bedside and pt agreed to plan.   Labs Review Labs Reviewed - No data to display  Imaging Review No results found.    EKG Interpretation None      MDM   Final diagnoses:  Viral URI with cough    Viral. Pt given otc script. Don't think x-ray needed at this time.  I personally  performed the services described in this documentation, which was scribed in my presence. The recorded information has been reviewed and is accurate.   Teressa LowerVrinda Boniface Goffe, NP 06/25/15 1413  Gwyneth SproutWhitney Plunkett, MD 06/25/15 1527

## 2015-06-25 NOTE — Discharge Instructions (Signed)
Upper Respiratory Infection, Adult Most upper respiratory infections (URIs) are a viral infection of the air passages leading to the lungs. A URI affects the nose, throat, and upper air passages. The most common type of URI is nasopharyngitis and is typically referred to as "the common cold." URIs run their course and usually go away on their own. Most of the time, a URI does not require medical attention, but sometimes a bacterial infection in the upper airways can follow a viral infection. This is called a secondary infection. Sinus and middle ear infections are common types of secondary upper respiratory infections. Bacterial pneumonia can also complicate a URI. A URI can worsen asthma and chronic obstructive pulmonary disease (COPD). Sometimes, these complications can require emergency medical care and may be life threatening.  CAUSES Almost all URIs are caused by viruses. A virus is a type of germ and can spread from one person to another.  RISKS FACTORS You may be at risk for a URI if:   You smoke.   You have chronic heart or lung disease.  You have a weakened defense (immune) system.   You are very young or very old.   You have nasal allergies or asthma.  You work in crowded or poorly ventilated areas.  You work in health care facilities or schools. SIGNS AND SYMPTOMS  Symptoms typically develop 2-3 days after you come in contact with a cold virus. Most viral URIs last 7-10 days. However, viral URIs from the influenza virus (flu virus) can last 14-18 days and are typically more severe. Symptoms may include:   Runny or stuffy (congested) nose.   Sneezing.   Cough.   Sore throat.   Headache.   Fatigue.   Fever.   Loss of appetite.   Pain in your forehead, behind your eyes, and over your cheekbones (sinus pain).  Muscle aches.  DIAGNOSIS  Your health care provider may diagnose a URI by:  Physical exam.  Tests to check that your symptoms are not due to  another condition such as:  Strep throat.  Sinusitis.  Pneumonia.  Asthma. TREATMENT  A URI goes away on its own with time. It cannot be cured with medicines, but medicines may be prescribed or recommended to relieve symptoms. Medicines may help:  Reduce your fever.  Reduce your cough.  Relieve nasal congestion. HOME CARE INSTRUCTIONS   Take medicines only as directed by your health care provider.   Gargle warm saltwater or take cough drops to comfort your throat as directed by your health care provider.  Use a warm mist humidifier or inhale steam from a shower to increase air moisture. This may make it easier to breathe.  Drink enough fluid to keep your urine clear or pale yellow.   Eat soups and other clear broths and maintain good nutrition.   Rest as needed.   Return to work when your temperature has returned to normal or as your health care provider advises. You may need to stay home longer to avoid infecting others. You can also use a face mask and careful hand washing to prevent spread of the virus.  Increase the usage of your inhaler if you have asthma.   Do not use any tobacco products, including cigarettes, chewing tobacco, or electronic cigarettes. If you need help quitting, ask your health care provider. PREVENTION  The best way to protect yourself from getting a cold is to practice good hygiene.   Avoid oral or hand contact with people with cold   symptoms.   Wash your hands often if contact occurs.  There is no clear evidence that vitamin C, vitamin E, echinacea, or exercise reduces the chance of developing a cold. However, it is always recommended to get plenty of rest, exercise, and practice good nutrition.  SEEK MEDICAL CARE IF:   You are getting worse rather than better.   Your symptoms are not controlled by medicine.   You have chills.  You have worsening shortness of breath.  You have brown or red mucus.  You have yellow or brown nasal  discharge.  You have pain in your face, especially when you bend forward.  You have a fever.  You have swollen neck glands.  You have pain while swallowing.  You have white areas in the back of your throat. SEEK IMMEDIATE MEDICAL CARE IF:   You have severe or persistent:  Headache.  Ear pain.  Sinus pain.  Chest pain.  You have chronic lung disease and any of the following:  Wheezing.  Prolonged cough.  Coughing up blood.  A change in your usual mucus.  You have a stiff neck.  You have changes in your:  Vision.  Hearing.  Thinking.  Mood. MAKE SURE YOU:   Understand these instructions.  Will watch your condition.  Will get help right away if you are not doing well or get worse.   This information is not intended to replace advice given to you by your health care provider. Make sure you discuss any questions you have with your health care provider.   Document Released: 02/13/2001 Document Revised: 01/04/2015 Document Reviewed: 11/25/2013 Elsevier Interactive Patient Education 2016 [employer]  

## 2015-06-25 NOTE — ED Notes (Signed)
Declined W/C at D/C and was escorted to lobby by RN. 

## 2015-06-25 NOTE — ED Notes (Signed)
PT reports havinga sore throat,conjestion that started this AM

## 2015-06-29 NOTE — Telephone Encounter (Signed)
Telephone call left message, can wait until next cycle to give Depo-Provera, or if not been sexually active since when it was due check UPT if negative give Depo-Provera .

## 2015-09-07 ENCOUNTER — Ambulatory Visit: Payer: Self-pay

## 2015-09-09 ENCOUNTER — Ambulatory Visit: Payer: Self-pay

## 2015-11-04 ENCOUNTER — Encounter: Payer: Self-pay | Admitting: Gynecology

## 2015-11-04 ENCOUNTER — Ambulatory Visit (INDEPENDENT_AMBULATORY_CARE_PROVIDER_SITE_OTHER): Payer: BLUE CROSS/BLUE SHIELD | Admitting: Gynecology

## 2015-11-04 VITALS — BP 150/90 | Ht 65.0 in | Wt 216.0 lb

## 2015-11-04 DIAGNOSIS — Z01419 Encounter for gynecological examination (general) (routine) without abnormal findings: Secondary | ICD-10-CM

## 2015-11-04 DIAGNOSIS — Z113 Encounter for screening for infections with a predominantly sexual mode of transmission: Secondary | ICD-10-CM

## 2015-11-04 DIAGNOSIS — Z308 Encounter for other contraceptive management: Secondary | ICD-10-CM

## 2015-11-04 NOTE — Progress Notes (Signed)
    Misty [name] 03/31/1991 [id number]        25 y.o.  G1P0010  for annual exam.  Several issues noted below.  Past medical history,surgical history, problem list, medications, allergies, family history and social history were all reviewed and documented as reviewed in the EPIC chart.  ROS:  Performed with pertinent positives and negatives included in the history, assessment and plan.   Additional significant findings :  none   Exam: Kennon PortelaKim [name] assistant Filed Vitals:   11/04/15 1512  BP: 150/90  Height: 5\' 5"  (1.651 m)  Weight: 216 lb (97.977 kg)   General appearance:  Normal affect, orientation and appearance. Skin: Grossly normal HEENT: Without gross lesions.  No cervical or supraclavicular adenopathy. Thyroid normal.  Lungs:  Clear without wheezing, rales or rhonchi Cardiac: RR, without RMG Abdominal:  Soft, nontender, without masses, guarding, rebound, organomegaly or hernia Breasts:  Without masses, retractions, discharge or axillary adenopathy. Pelvic:  Ext/BUS/vagina normal  Cervix normal. Pap smear done, GC/chlamydia  Uterus anteverted, normal size, shape and contour, midline and mobile nontender   Adnexa without masses or tenderness    Anus and perineum normal      Assessment/Plan:  25 y.o. 221P0010 female for annual exam without menses, Depo-Provera.   1. Contraception. Patient started back on Depo-Provera this year. Had transiently used in the past. Being followed for diabetes and hypertension. Alternatives reviewed to include IUD which patient declines. At this point I feel benefits of Depo-Provera outweigh any risks given her medical issues. I reviewed the black box warning as far as to year limitations and increased risk of loss of calcium from the bones. 2. Pap smear was inadequate last year but negative HPV. I repeated her Pap smear this year. 3. GC/committee it done today. Declines serum screening. Need to use condoms regardless to help decrease STD risks  reviewed. 4. Reports Gardasil series received. 5. Breast health. SBE monthly reviewed. 6. Health maintenance. Blood pressure 150/90. Patients actively being followed for hypertension and having her medications adjusted. Can check blood pressure at home and have instructed her to do so tonight just to make sure that it's not any higher. Continue to follow up with her primary reference to this. No routine lab work done as this is done at her primary physician's office. Follow up 1 year, sooner as needed.   Dara LordsFONTAINE,TIMOTHY P MD, 3:38 PM 11/04/2015

## 2015-11-04 NOTE — Addendum Note (Signed)
Addended by: Dayna BarkerGARDNER, KIMBERLY K on: 11/04/2015 03:50 PM   Modules accepted: Orders

## 2015-11-04 NOTE — Patient Instructions (Signed)

## 2015-11-05 LAB — GC/CHLAMYDIA PROBE AMP
CT PROBE, AMP APTIMA: NOT DETECTED
GC Probe RNA: NOT DETECTED

## 2015-11-07 LAB — PAP IG W/ RFLX HPV ASCU

## 2015-11-14 ENCOUNTER — Ambulatory Visit: Payer: Self-pay

## 2015-11-14 ENCOUNTER — Ambulatory Visit: Payer: BLUE CROSS/BLUE SHIELD

## 2015-11-21 ENCOUNTER — Other Ambulatory Visit: Payer: Self-pay | Admitting: Gynecology

## 2016-01-10 ENCOUNTER — Encounter (HOSPITAL_COMMUNITY): Payer: Self-pay

## 2016-01-10 ENCOUNTER — Emergency Department (HOSPITAL_COMMUNITY)
Admission: EM | Admit: 2016-01-10 | Discharge: 2016-01-10 | Disposition: A | Discharge status: Home / Self Care | Payer: Self-pay | Attending: Emergency Medicine | Admitting: Emergency Medicine

## 2016-01-10 DIAGNOSIS — Z862 Personal history of diseases of the blood and blood-forming organs and certain disorders involving the immune mechanism: Secondary | ICD-10-CM | POA: Insufficient documentation

## 2016-01-10 DIAGNOSIS — Z7982 Long term (current) use of aspirin: Secondary | ICD-10-CM | POA: Insufficient documentation

## 2016-01-10 DIAGNOSIS — Z3202 Encounter for pregnancy test, result negative: Secondary | ICD-10-CM | POA: Insufficient documentation

## 2016-01-10 DIAGNOSIS — E1165 Type 2 diabetes mellitus with hyperglycemia: Secondary | ICD-10-CM | POA: Insufficient documentation

## 2016-01-10 DIAGNOSIS — Z87891 Personal history of nicotine dependence: Secondary | ICD-10-CM | POA: Insufficient documentation

## 2016-01-10 DIAGNOSIS — R739 Hyperglycemia, unspecified: Secondary | ICD-10-CM

## 2016-01-10 DIAGNOSIS — R1033 Periumbilical pain: Secondary | ICD-10-CM | POA: Insufficient documentation

## 2016-01-10 DIAGNOSIS — Z793 Long term (current) use of hormonal contraceptives: Secondary | ICD-10-CM | POA: Insufficient documentation

## 2016-01-10 DIAGNOSIS — Z872 Personal history of diseases of the skin and subcutaneous tissue: Secondary | ICD-10-CM | POA: Insufficient documentation

## 2016-01-10 DIAGNOSIS — I10 Essential (primary) hypertension: Secondary | ICD-10-CM | POA: Insufficient documentation

## 2016-01-10 DIAGNOSIS — J45909 Unspecified asthma, uncomplicated: Secondary | ICD-10-CM | POA: Insufficient documentation

## 2016-01-10 LAB — CBC WITH DIFFERENTIAL/PLATELET
Basophils Absolute: 0 10*3/uL (ref 0.0–0.1)
Basophils Relative: 0 %
EOS ABS: 0.1 10*3/uL (ref 0.0–0.7)
Eosinophils Relative: 1 %
HEMATOCRIT: 39.7 % (ref 36.0–46.0)
HEMOGLOBIN: 13.6 g/dL (ref 12.0–15.0)
LYMPHS ABS: 1.9 10*3/uL (ref 0.7–4.0)
LYMPHS PCT: 30 %
MCH: 31.3 pg (ref 26.0–34.0)
MCHC: 34.3 g/dL (ref 30.0–36.0)
MCV: 91.3 fL (ref 78.0–100.0)
Monocytes Absolute: 0.3 10*3/uL (ref 0.1–1.0)
Monocytes Relative: 5 %
NEUTROS ABS: 4.1 10*3/uL (ref 1.7–7.7)
NEUTROS PCT: 64 %
Platelets: 276 10*3/uL (ref 150–400)
RBC: 4.35 MIL/uL (ref 3.87–5.11)
RDW: 11.9 % (ref 11.5–15.5)
WBC: 6.3 10*3/uL (ref 4.0–10.5)

## 2016-01-10 LAB — URINALYSIS, ROUTINE W REFLEX MICROSCOPIC
Bilirubin Urine: NEGATIVE
Ketones, ur: 15 mg/dL — AB
LEUKOCYTES UA: NEGATIVE
Nitrite: NEGATIVE
PH: 5.5 (ref 5.0–8.0)
PROTEIN: NEGATIVE mg/dL
SPECIFIC GRAVITY, URINE: 1.038 — AB (ref 1.005–1.030)

## 2016-01-10 LAB — COMPREHENSIVE METABOLIC PANEL
ALBUMIN: 3.8 g/dL (ref 3.5–5.0)
ALT: 21 U/L (ref 14–54)
ANION GAP: 12 (ref 5–15)
AST: 20 U/L (ref 15–41)
Alkaline Phosphatase: 118 U/L (ref 38–126)
BILIRUBIN TOTAL: 0.5 mg/dL (ref 0.3–1.2)
BUN: 6 mg/dL (ref 6–20)
CHLORIDE: 101 mmol/L (ref 101–111)
CO2: 20 mmol/L — ABNORMAL LOW (ref 22–32)
Calcium: 9.5 mg/dL (ref 8.9–10.3)
Creatinine, Ser: 0.83 mg/dL (ref 0.44–1.00)
GFR calc Af Amer: >60 mL/min (ref >60)
GFR calc non Af Amer: >60 mL/min (ref >60)
GLUCOSE: 430 mg/dL — AB (ref 65–99)
POTASSIUM: 4.6 mmol/L (ref 3.5–5.1)
SODIUM: 133 mmol/L — AB (ref 135–145)
TOTAL PROTEIN: 7.1 g/dL (ref 6.5–8.1)

## 2016-01-10 LAB — URINE MICROSCOPIC-ADD ON: WBC, UA: NONE SEEN WBC/hpf (ref 0–5)

## 2016-01-10 LAB — LIPASE, BLOOD: LIPASE: 40 U/L (ref 11–51)

## 2016-01-10 LAB — CBG MONITORING, ED: GLUCOSE-CAPILLARY: 211 mg/dL — AB (ref 65–99)

## 2016-01-10 LAB — HCG, QUANTITATIVE, PREGNANCY

## 2016-01-10 MED ORDER — BLOOD GLUCOSE MONITOR KIT: PACK | Status: DC

## 2016-01-10 MED ORDER — LISINOPRIL 10 MG PO TABS: 10.0000 mg | ORAL_TABLET | Freq: Every day | ORAL | Status: DC

## 2016-01-10 MED ORDER — SODIUM CHLORIDE 0.9 % IV BOLUS (SEPSIS)
1000.0000 mL | Freq: Once | INTRAVENOUS | Status: AC
  Administered 2016-01-10: 1000 mL via INTRAVENOUS

## 2016-01-10 MED ORDER — INSULIN ASPART 100 UNIT/ML ~~LOC~~ SOLN
8.0000 [IU] | Freq: Once | SUBCUTANEOUS | Status: AC
  Administered 2016-01-10: 8 [IU] via INTRAVENOUS
  Filled 2016-01-10: qty 1

## 2016-01-10 MED ORDER — CANAGLIFLOZIN-METFORMIN HCL 50-1000 MG PO TABS: 1.0000 | ORAL_TABLET | Freq: Two times a day (BID) | ORAL | Status: DC

## 2016-01-10 NOTE — ED Notes (Signed)
Patient's CBG was 211

## 2016-01-10 NOTE — Discharge Instructions (Signed)

## 2016-01-10 NOTE — ED Notes (Signed)
Pt presents with 2 day h/o umbilical pain.  Pt reports pain is constant and radiates straight through to her back.  -nausea or vomiting;  Last bowel movement was last night;  Pt denies any dysuria or vaginal discharge.

## 2016-01-10 NOTE — ED Provider Notes (Signed)
CSN: [id number]     Arrival date & time 01/10/16  1045 History   First MD Initiated Contact with Patient 01/10/16 1108     No chief complaint on file.  HPI   25 year old female presents today with complaints of abdominal discomfort. Patient reports that 2 days ago she started experience abdominal periumbilical abdominal discomfort. She describes it as being punched in the stomach, notes that it comes and goes. Patient denies any associated fever, chills, nausea, vomiting, diarrhea. He patient reports that she is sexually active, but uses Depo-Provera. Patient has a history diabetes, currently taking her diabetic medication as directed, but does not have a glucose monitoring kit as she had lost hers. She reports her last CBG was approximately one month ago. Patient notes normal bowel movements, noting last night was last time. Patient denies any past surgical history.   Past Medical History  Diagnosis Date  . Allergy   . Diabetes mellitus without complication ([place]) 6808  . Anemia 2015  . Hypertension 09/2013  . Asthma     as child  . Eczema    Past Surgical History  Procedure Laterality Date  . No past surgeries     Family History  Problem Relation Age of Onset  . Hypertension Mother   . Heart disease Mother 42    CHF  . Diabetes Mother   . Sickle cell anemia Brother   . Hypertension Maternal Grandfather   . Diabetes Maternal Grandfather   . Heart disease Maternal Grandfather   . Diabetes Maternal Grandmother   . Heart disease Maternal Grandmother    Social History  Substance Use Topics  . Smoking status: Former Smoker -- 0.50 packs/day for 2 years    Quit date: 09/08/2013  . Smokeless tobacco: Never Used  . Alcohol Use: No   OB History    Gravida Para Term Preterm AB TAB SAB Ectopic Multiple Living   '1 0 0 0 1 0 1 0 0 0 '$     Review of Systems  All other systems reviewed and are negative.     Allergies  Iodides; Morphine and related; and Shellfish allergy  Home  Medications   Prior to Admission medications   Medication Sig Start Date End Date Taking? Authorizing Provider  aspirin 81 MG tablet Take 81 mg by mouth daily.   Yes Historical Provider, MD  cyclobenzaprine (FLEXERIL) 10 MG tablet Take 10 mg by mouth at bedtime.   Yes Historical Provider, MD  medroxyPROGESTERone (DEPO-PROVERA) 150 MG/ML injection Inject 1 mL (150 mg total) into the muscle every 3 (three) months. 11/16/14  Yes Anastasio Auerbach, MD  MedroxyPROGESTERone Acetate 150 MG/ML SUSY INJECT 1 ML (150 MG TOTAL) INTO THE MUSCLE EVERY 3 (THREE) MONTHS. 11/22/15  Yes Anastasio Auerbach, MD  blood glucose meter kit and supplies KIT Dispense based on patient and insurance preference. Use up to four times daily as directed. (FOR ICD-9 250.00, 250.01). 01/10/16   Okey Regal, PA-C  Canagliflozin-Metformin HCl (INVOKAMET) 50-1000 MG TABS Take 1 tablet by mouth 2 (two) times daily. 01/10/16   Okey Regal, PA-C  lisinopril (PRINIVIL,ZESTRIL) 10 MG tablet Take 1 tablet (10 mg total) by mouth daily. 01/10/16   Nadean Montanaro, PA-C   BP 132/89 mmHg  Pulse 68  Temp(Src) 98.5 F (36.9 C) (Oral)  Resp 18  Ht 5' 3.5" (1.613 m)  Wt 98.431 kg  BMI 37.83 kg/m2  SpO2 100%  LMP 01/01/2016 (Approximate)   Physical Exam  Constitutional: She is oriented to  person, place, and time. She appears well-developed and well-nourished.  Nontoxic in no acute distress  HENT:  Head: Normocephalic and atraumatic.  Eyes: Conjunctivae are normal. Pupils are equal, round, and reactive to light. Right eye exhibits no discharge. Left eye exhibits no discharge. No scleral icterus.  Neck: Normal range of motion. No JVD present. No tracheal deviation present.  Pulmonary/Chest: Effort normal. No stridor.  Abdominal: Soft. Bowel sounds are normal. She exhibits no distension and no mass. There is no tenderness. There is no rebound and no guarding.  Patient has no abdominal pain to palpation  Musculoskeletal: Normal range of  motion. She exhibits no edema or tenderness.  Neurological: She is alert and oriented to person, place, and time. Coordination normal.  Psychiatric: She has a normal mood and affect. Her behavior is normal. Judgment and thought content normal.  Nursing note and vitals reviewed.   ED Course  Procedures (including critical care time) Labs Review Labs Reviewed  COMPREHENSIVE METABOLIC PANEL - Abnormal; Notable for the following:    Sodium 133 (*)    CO2 20 (*)    Glucose, Bld 430 (*)    All other components within normal limits  URINALYSIS, ROUTINE W REFLEX MICROSCOPIC (NOT AT [facility]) - Abnormal; Notable for the following:    Specific Gravity, Urine 1.038 (*)    Glucose, UA >1000 (*)    Hgb urine dipstick SMALL (*)    Ketones, ur 15 (*)    All other components within normal limits  URINE MICROSCOPIC-ADD ON - Abnormal; Notable for the following:    Squamous Epithelial / LPF 0-5 (*)    Bacteria, UA RARE (*)    All other components within normal limits  CBG MONITORING, ED - Abnormal; Notable for the following:    Glucose-Capillary 211 (*)    All other components within normal limits  LIPASE, BLOOD  CBC WITH DIFFERENTIAL/PLATELET  HCG, QUANTITATIVE, PREGNANCY    Imaging Review No results found. I have personally reviewed and evaluated these images and lab results as part of my medical decision-making.   EKG Interpretation None      MDM   Final diagnoses:  Periumbilical abdominal pain  Hyperglycemia    Labs:CBG, urinalysis, CMP, lipase, CBC, hCG- glucose 4:30  Imaging:  Consults:  Therapeutics:  Discharge Meds:   Assessment/Plan: 25 year old female. Presents with abdominal discomfort. She was found to have hyperglycemia here, treated with normal saline and insulin. Patient's blood sugar dropped to 211, reported improvement in her symptoms. Patient has very minimal abdominal discomfort, she has no significant tenderness to palpation of the abdomen, nonsurgical abdomen.  She is afebrile, nontoxic with no signs of infectious etiology. Patient will be discharged home with glucose monitoring kit, refill of her hypertension medication, encouraged follow-up with her primary care provider in 2 days for reevaluation. She is instructed to return immediately if any new or worsening signs or symptoms present. I very low suspicion for any significant infectious etiology, low suspicion for any surgical potential life-threatening intra-abdominal pathology. Patient remained happy and pleasant throughout her stay, in no acute distress. She was tolerating by mouth without difficulty, she is discharged home with strict return precautions. She verbalized understanding and agreement today's plan had no further questions or concerns at time of discharge.        Okey Regal, PA-C 01/10/16 1504  Elnora Morrison, MD 01/10/16 [phone]

## 2016-01-18 ENCOUNTER — Emergency Department (HOSPITAL_COMMUNITY)
Admission: EM | Admit: 2016-01-18 | Discharge: 2016-01-18 | Disposition: A | Discharge status: Home / Self Care | Payer: Self-pay | Attending: Emergency Medicine | Admitting: Emergency Medicine

## 2016-01-18 ENCOUNTER — Encounter (HOSPITAL_COMMUNITY): Payer: Self-pay | Admitting: Emergency Medicine

## 2016-01-18 DIAGNOSIS — Z872 Personal history of diseases of the skin and subcutaneous tissue: Secondary | ICD-10-CM | POA: Insufficient documentation

## 2016-01-18 DIAGNOSIS — Z7982 Long term (current) use of aspirin: Secondary | ICD-10-CM | POA: Insufficient documentation

## 2016-01-18 DIAGNOSIS — R110 Nausea: Secondary | ICD-10-CM | POA: Insufficient documentation

## 2016-01-18 DIAGNOSIS — J45909 Unspecified asthma, uncomplicated: Secondary | ICD-10-CM | POA: Insufficient documentation

## 2016-01-18 DIAGNOSIS — E1165 Type 2 diabetes mellitus with hyperglycemia: Secondary | ICD-10-CM | POA: Insufficient documentation

## 2016-01-18 DIAGNOSIS — Z79899 Other long term (current) drug therapy: Secondary | ICD-10-CM | POA: Insufficient documentation

## 2016-01-18 DIAGNOSIS — Z3202 Encounter for pregnancy test, result negative: Secondary | ICD-10-CM | POA: Insufficient documentation

## 2016-01-18 DIAGNOSIS — R1084 Generalized abdominal pain: Secondary | ICD-10-CM

## 2016-01-18 DIAGNOSIS — Z862 Personal history of diseases of the blood and blood-forming organs and certain disorders involving the immune mechanism: Secondary | ICD-10-CM | POA: Insufficient documentation

## 2016-01-18 DIAGNOSIS — Z87891 Personal history of nicotine dependence: Secondary | ICD-10-CM | POA: Insufficient documentation

## 2016-01-18 DIAGNOSIS — I10 Essential (primary) hypertension: Secondary | ICD-10-CM | POA: Insufficient documentation

## 2016-01-18 DIAGNOSIS — R739 Hyperglycemia, unspecified: Secondary | ICD-10-CM

## 2016-01-18 DIAGNOSIS — Z7984 Long term (current) use of oral hypoglycemic drugs: Secondary | ICD-10-CM | POA: Insufficient documentation

## 2016-01-18 LAB — URINALYSIS, ROUTINE W REFLEX MICROSCOPIC
Bilirubin Urine: NEGATIVE
KETONES UR: 40 mg/dL — AB
LEUKOCYTES UA: NEGATIVE
NITRITE: NEGATIVE
PH: 5.5 (ref 5.0–8.0)
Protein, ur: NEGATIVE mg/dL
SPECIFIC GRAVITY, URINE: 1.046 — AB (ref 1.005–1.030)

## 2016-01-18 LAB — LIPASE, BLOOD: Lipase: 24 U/L (ref 11–51)

## 2016-01-18 LAB — CBC WITH DIFFERENTIAL/PLATELET
Basophils Absolute: 0 K/uL (ref 0.0–0.1)
Basophils Relative: 0 %
Eosinophils Absolute: 0 K/uL (ref 0.0–0.7)
Eosinophils Relative: 1 %
HCT: 40 % (ref 36.0–46.0)
Hemoglobin: 13.9 g/dL (ref 12.0–15.0)
Lymphocytes Relative: 28 %
Lymphs Abs: 1.9 K/uL (ref 0.7–4.0)
MCH: 31.6 pg (ref 26.0–34.0)
MCHC: 34.8 g/dL (ref 30.0–36.0)
MCV: 90.9 fL (ref 78.0–100.0)
Monocytes Absolute: 0.5 K/uL (ref 0.1–1.0)
Monocytes Relative: 7 %
Neutro Abs: 4.5 K/uL (ref 1.7–7.7)
Neutrophils Relative %: 64 %
Platelets: 276 K/uL (ref 150–400)
RBC: 4.4 MIL/uL (ref 3.87–5.11)
RDW: 12.3 % (ref 11.5–15.5)
WBC: 6.9 K/uL (ref 4.0–10.5)

## 2016-01-18 LAB — URINE MICROSCOPIC-ADD ON
BACTERIA UA: NONE SEEN
RBC / HPF: NONE SEEN RBC/hpf (ref 0–5)
WBC UA: NONE SEEN WBC/hpf (ref 0–5)

## 2016-01-18 LAB — COMPREHENSIVE METABOLIC PANEL
ALK PHOS: 90 U/L (ref 38–126)
ALT: 15 U/L (ref 14–54)
AST: 17 U/L (ref 15–41)
Albumin: 3.9 g/dL (ref 3.5–5.0)
Anion gap: 15 (ref 5–15)
BUN: 10 mg/dL (ref 6–20)
CALCIUM: 9.6 mg/dL (ref 8.9–10.3)
CHLORIDE: 103 mmol/L (ref 101–111)
CO2: 20 mmol/L — AB (ref 22–32)
CREATININE: 0.95 mg/dL (ref 0.44–1.00)
GFR calc Af Amer: >60 mL/min (ref >60)
GFR calc non Af Amer: >60 mL/min (ref >60)
GLUCOSE: 206 mg/dL — AB (ref 65–99)
Potassium: 4.3 mmol/L (ref 3.5–5.1)
SODIUM: 138 mmol/L (ref 135–145)
Total Bilirubin: 0.7 mg/dL (ref 0.3–1.2)
Total Protein: 6.8 g/dL (ref 6.5–8.1)

## 2016-01-18 LAB — I-STAT BETA HCG BLOOD, ED (MC, WL, AP ONLY)

## 2016-01-18 MED ORDER — ONDANSETRON 4 MG PO TBDP: 4.0000 mg | ORAL_TABLET | Freq: Three times a day (TID) | ORAL | Status: DC | PRN

## 2016-01-18 MED ORDER — ONDANSETRON HCL 4 MG/2ML IJ SOLN
4.0000 mg | Freq: Once | INTRAMUSCULAR | Status: AC
  Administered 2016-01-18: 4 mg via INTRAVENOUS
  Filled 2016-01-18: qty 2

## 2016-01-18 MED ORDER — ACETAMINOPHEN 325 MG PO TABS
650.0000 mg | ORAL_TABLET | Freq: Once | ORAL | Status: AC
  Administered 2016-01-18: 650 mg via ORAL
  Filled 2016-01-18: qty 2

## 2016-01-18 MED ORDER — SODIUM CHLORIDE 0.9 % IV BOLUS (SEPSIS)
1000.0000 mL | Freq: Once | INTRAVENOUS | Status: AC
  Administered 2016-01-18: 1000 mL via INTRAVENOUS

## 2016-01-18 MED ORDER — ACETAMINOPHEN 325 MG PO TABS: 650.0000 mg | ORAL_TABLET | Freq: Four times a day (QID) | ORAL | Status: DC | PRN

## 2016-01-18 NOTE — Discharge Instructions (Signed)
You were seen in the ER today for evaluation of abdominal pain. Your labs showed slightly elevated blood sugar but were otherwise normal. Some of your pain could be due to the Invokamet that you recently started taking. Please call your primary care provider to schedule a follow up appointment as soon as possible. In the meantime continue your home medications as prescribed. Return to the ER for new or worsening symptoms.

## 2016-01-18 NOTE — ED Notes (Signed)
Pt is aware urine is needed, unable to urinate this time.

## 2016-01-18 NOTE — ED Notes (Signed)
Pt reports generalized abd pain which she believes is related to hyperglycemia. Pt alert x4. NAD at this time. Pt has not checked a CBG today.

## 2016-01-18 NOTE — ED Provider Notes (Signed)
CSN: [id number]     Arrival date & time 01/18/16  2683 History   First MD Initiated Contact with Patient 01/18/16 0919     Chief Complaint  Patient presents with  . Abdominal Pain   HPI   Misty [name] is an 25 y.o. female with history of DM who presents to the ED for evaluation of abdominal pain. She reports 7/10 generalized abdominal pain that began this morning. She reports associated nausea but denies emesis. She denies diarrhea. She has not tried anything to alleviate her symptoms. She states that she "has sugar" and thinks her pain could be related to this. She states she has been taking invokanna/metformin, a new prescription for her since last week, but it has been upsetting her stomach. She has also been taking insulin as prescribed. Denies fever, chills, dysuria. She states she has had increased urinary frequency for "a while" but thinks it is due to her invokanna.   Past Medical History  Diagnosis Date  . Allergy   . Diabetes mellitus without complication ([place]) 4196  . Anemia 2015  . Hypertension 09/2013  . Asthma     as child  . Eczema    Past Surgical History  Procedure Laterality Date  . No past surgeries     Family History  Problem Relation Age of Onset  . Hypertension Mother   . Heart disease Mother 40    CHF  . Diabetes Mother   . Sickle cell anemia Brother   . Hypertension Maternal Grandfather   . Diabetes Maternal Grandfather   . Heart disease Maternal Grandfather   . Diabetes Maternal Grandmother   . Heart disease Maternal Grandmother    Social History  Substance Use Topics  . Smoking status: Former Smoker -- 0.50 packs/day for 2 years    Quit date: 09/08/2013  . Smokeless tobacco: Never Used  . Alcohol Use: No   OB History    Gravida Para Term Preterm AB TAB SAB Ectopic Multiple Living   '1 0 0 0 1 0 1 0 0 0 '$     Review of Systems  All other systems reviewed and are negative.     Allergies  Iodides; Morphine and related; and Shellfish  allergy  Home Medications   Prior to Admission medications   Medication Sig Start Date End Date Taking? Authorizing Provider  aspirin 81 MG tablet Take 81 mg by mouth daily.    Historical Provider, MD  blood glucose meter kit and supplies KIT Dispense based on patient and insurance preference. Use up to four times daily as directed. (FOR ICD-9 250.00, 250.01). 01/10/16   Okey Regal, PA-C  Canagliflozin-Metformin HCl (INVOKAMET) 50-1000 MG TABS Take 1 tablet by mouth 2 (two) times daily. 01/10/16   Okey Regal, PA-C  cyclobenzaprine (FLEXERIL) 10 MG tablet Take 10 mg by mouth at bedtime.    Historical Provider, MD  lisinopril (PRINIVIL,ZESTRIL) 10 MG tablet Take 1 tablet (10 mg total) by mouth daily. 01/10/16   Okey Regal, PA-C  medroxyPROGESTERone (DEPO-PROVERA) 150 MG/ML injection Inject 1 mL (150 mg total) into the muscle every 3 (three) months. 11/16/14   Anastasio Auerbach, MD  MedroxyPROGESTERone Acetate 150 MG/ML SUSY INJECT 1 ML (150 MG TOTAL) INTO THE MUSCLE EVERY 3 (THREE) MONTHS. 11/22/15   Anastasio Auerbach, MD   BP 141/87 mmHg  Pulse 81  Temp(Src) 98.5 F (36.9 C) (Oral)  Resp 18  SpO2 100%  LMP 01/01/2016 (Approximate) Physical Exam  Constitutional: She is oriented to  person, place, and time.  HENT:  Right Ear: External ear normal.  Left Ear: External ear normal.  Nose: Nose normal.  Mouth/Throat: Oropharynx is clear and moist. No oropharyngeal exudate.  Eyes: Conjunctivae and EOM are normal. Pupils are equal, round, and reactive to light.  Neck: Normal range of motion. Neck supple.  Cardiovascular: Normal rate, regular rhythm, normal heart sounds and intact distal pulses.   Pulmonary/Chest: Effort normal and breath sounds normal. No respiratory distress. She has no wheezes. She exhibits no tenderness.  Abdominal: Soft. Bowel sounds are normal. She exhibits no distension. There is no tenderness. There is no rebound and no guarding.  No CVA tenderness   Musculoskeletal: She exhibits no edema.  Neurological: She is alert and oriented to person, place, and time. No cranial nerve deficit.  Skin: Skin is warm and dry.  Psychiatric: She has a normal mood and affect.  Nursing note and vitals reviewed.   ED Course  Procedures (including critical care time) Labs Review Labs Reviewed  COMPREHENSIVE METABOLIC PANEL - Abnormal; Notable for the following:    CO2 20 (*)    Glucose, Bld 206 (*)    All other components within normal limits  URINALYSIS, ROUTINE W REFLEX MICROSCOPIC (NOT AT [facility]) - Abnormal; Notable for the following:    Specific Gravity, Urine 1.046 (*)    Glucose, UA >1000 (*)    Hgb urine dipstick TRACE (*)    Ketones, ur 40 (*)    All other components within normal limits  URINE MICROSCOPIC-ADD ON - Abnormal; Notable for the following:    Squamous Epithelial / LPF 0-5 (*)    All other components within normal limits  LIPASE, BLOOD  CBC WITH DIFFERENTIAL/PLATELET  I-STAT BETA HCG BLOOD, ED ([facility], [facility], [facility] ONLY)    Imaging Review No results found. I have personally reviewed and evaluated these images and lab results as part of my medical decision-making.   EKG Interpretation None      MDM   Final diagnoses:  Generalized abdominal pain  Hyperglycemia    On re-eval pt reports pain and nausea resolved. She is tolerating PO. Her abdominal exam remains benign. Her labs are unremarkable with a glucose of 206 though no marked hyperglycema, no anion gap. +glucosuria, 40 ketones. 1L NS bolus given in the ED. I do suspect some of her abdominal discomfort could be due to the invokamet she recently started. I encouraged her to call PCP for f/u as soon as possible and to consider glucophage XR and invokanna separately. Pt is nontoxic appearing, VSS, and stable for discharge. ER return precautions given.    Anne Ng, PA-C 01/18/16 [place], MD 01/21/16 [phone]

## 2016-02-16 ENCOUNTER — Ambulatory Visit: Payer: BLUE CROSS/BLUE SHIELD

## 2016-03-29 ENCOUNTER — Telehealth: Payer: Self-pay

## 2016-03-29 NOTE — Telephone Encounter (Signed)
I normally recommend over-the-counter prenatal vitamins. As long as they are labeled prenatal vitamins they have sufficient folic acid and iron.

## 2016-03-29 NOTE — Telephone Encounter (Signed)
Left detailed message in voicemail.

## 2016-03-29 NOTE — Telephone Encounter (Signed)
Patient asked if you would prescribe prenatal vitamins for her.

## 2016-04-02 ENCOUNTER — Encounter (HOSPITAL_COMMUNITY): Payer: Self-pay | Admitting: Emergency Medicine

## 2016-04-02 ENCOUNTER — Emergency Department (HOSPITAL_COMMUNITY)
Admission: EM | Admit: 2016-04-02 | Discharge: 2016-04-02 | Disposition: A | Discharge status: Home / Self Care | Payer: Self-pay | Attending: Emergency Medicine | Admitting: Emergency Medicine

## 2016-04-02 DIAGNOSIS — Z7982 Long term (current) use of aspirin: Secondary | ICD-10-CM | POA: Insufficient documentation

## 2016-04-02 DIAGNOSIS — W57XXXA Bitten or stung by nonvenomous insect and other nonvenomous arthropods, initial encounter: Secondary | ICD-10-CM | POA: Insufficient documentation

## 2016-04-02 DIAGNOSIS — Z794 Long term (current) use of insulin: Secondary | ICD-10-CM | POA: Insufficient documentation

## 2016-04-02 DIAGNOSIS — Z87891 Personal history of nicotine dependence: Secondary | ICD-10-CM | POA: Insufficient documentation

## 2016-04-02 DIAGNOSIS — J45909 Unspecified asthma, uncomplicated: Secondary | ICD-10-CM | POA: Insufficient documentation

## 2016-04-02 DIAGNOSIS — S1096XA Insect bite of unspecified part of neck, initial encounter: Secondary | ICD-10-CM | POA: Insufficient documentation

## 2016-04-02 DIAGNOSIS — Z79899 Other long term (current) drug therapy: Secondary | ICD-10-CM | POA: Insufficient documentation

## 2016-04-02 DIAGNOSIS — S40862A Insect bite (nonvenomous) of left upper arm, initial encounter: Secondary | ICD-10-CM | POA: Insufficient documentation

## 2016-04-02 DIAGNOSIS — Y939 Activity, unspecified: Secondary | ICD-10-CM | POA: Insufficient documentation

## 2016-04-02 DIAGNOSIS — E119 Type 2 diabetes mellitus without complications: Secondary | ICD-10-CM | POA: Insufficient documentation

## 2016-04-02 DIAGNOSIS — I10 Essential (primary) hypertension: Secondary | ICD-10-CM | POA: Insufficient documentation

## 2016-04-02 DIAGNOSIS — Y929 Unspecified place or not applicable: Secondary | ICD-10-CM | POA: Insufficient documentation

## 2016-04-02 DIAGNOSIS — S30860A Insect bite (nonvenomous) of lower back and pelvis, initial encounter: Secondary | ICD-10-CM | POA: Insufficient documentation

## 2016-04-02 DIAGNOSIS — Y999 Unspecified external cause status: Secondary | ICD-10-CM | POA: Insufficient documentation

## 2016-04-02 MED ORDER — DIPHENHYDRAMINE HCL 25 MG PO CAPS
25.0000 mg | ORAL_CAPSULE | Freq: Once | ORAL | Status: AC
Start: 2016-04-02 — End: 2016-04-02
  Administered 2016-04-02: 25 mg via ORAL
  Filled 2016-04-02: qty 1

## 2016-04-02 MED ORDER — LISINOPRIL 10 MG PO TABS: 10.0000 mg | ORAL_TABLET | Freq: Every day | ORAL | 0 refills | Status: DC

## 2016-04-02 NOTE — ED Provider Notes (Signed)
[facility] DEPT Provider Note   CSN: [id number] Arrival date & time: 04/02/16  1646  First Provider Contact:   First PA Initiated Contact with Patient 04/02/16 1951      By signing my name below, I, Dora Sims, attest that this documentation has been prepared under the direction and in the presence of Delsa Grana, PA-C. Electronically Signed: Dora Sims, Scribe. 04/02/2016. 7:52 PM.   History   Chief Complaint No chief complaint on file.   The history is provided by the patient. No language interpreter was used.     HPI Comments: Misty [name] is a 25 y.o. female with PMHx of eczema who presents to the Emergency Department complaining of sudden onset, constant, pruritic bumps on several areas of her body beginning several days ago. Pt has concern for bed bugs; she states her brother came into town a few days ago and believes this may be the cause of her symptoms. Pt notes pruritic red patches to her left upper arm, posterior neck, abdomen, and lower back. She state she saw a bed bug on her this morning. Pt has not taken any medications for her symptoms. She denies SOB, oral swelling, or any other associated symptoms.  Past Medical History:  Diagnosis Date  . Allergy   . Anemia 2015  . Asthma    as child  . Diabetes mellitus without complication ([place]) 9774  . Eczema   . Hypertension 09/2013    There are no active problems to display for this patient.   Past Surgical History:  Procedure Laterality Date  . NO PAST SURGERIES      OB History    Gravida Para Term Preterm AB Living   1 0 0 0 1 0   SAB TAB Ectopic Multiple Live Births   1 0 0 0         Home Medications    Prior to Admission medications   Medication Sig Start Date End Date Taking? Authorizing Provider  acetaminophen (TYLENOL) 325 MG tablet Take 2 tablets (650 mg total) by mouth every 6 (six) hours as needed. 01/18/16   Anne Ng, PA-C  aspirin 81 MG tablet Take 81 mg by mouth daily.     Historical Provider, MD  blood glucose meter kit and supplies KIT Dispense based on patient and insurance preference. Use up to four times daily as directed. (FOR ICD-9 250.00, 250.01). 01/10/16   Okey Regal, PA-C  Canagliflozin-Metformin HCl (INVOKAMET) 50-1000 MG TABS Take 1 tablet by mouth 2 (two) times daily. 01/10/16   Okey Regal, PA-C  cyclobenzaprine (FLEXERIL) 10 MG tablet Take 10 mg by mouth at bedtime.    Historical Provider, MD  Insulin Glargine (TOUJEO SOLOSTAR [place]) Inject 10 Units into the skin daily.    Historical Provider, MD  lisinopril (PRINIVIL,ZESTRIL) 10 MG tablet Take 1 tablet (10 mg total) by mouth daily. 01/10/16   Okey Regal, PA-C  MedroxyPROGESTERone Acetate 150 MG/ML SUSY INJECT 1 ML (150 MG TOTAL) INTO THE MUSCLE EVERY 3 (THREE) MONTHS. 11/22/15   Anastasio Auerbach, MD  ondansetron (ZOFRAN ODT) 4 MG disintegrating tablet Take 1 tablet (4 mg total) by mouth every 8 (eight) hours as needed for nausea or vomiting. 01/18/16   Anne Ng, PA-C    Family History Family History  Problem Relation Age of Onset  . Hypertension Mother   . Heart disease Mother 75    CHF  . Diabetes Mother   . Sickle cell anemia Brother   .  Hypertension Maternal Grandfather   . Diabetes Maternal Grandfather   . Heart disease Maternal Grandfather   . Diabetes Maternal Grandmother   . Heart disease Maternal Grandmother     Social History Social History  Substance Use Topics  . Smoking status: Former Smoker    Packs/day: 0.50    Years: 2.00    Quit date: 09/08/2013  . Smokeless tobacco: Never Used  . Alcohol use No     Allergies   Iodides; Morphine and related; and Shellfish allergy   Review of Systems Review of Systems  All other systems reviewed and are negative.    Physical Exam Updated Vital Signs BP 151/96 (BP Location: Left Arm)   Pulse 88   Temp 99.1 F (37.3 C) (Oral)   Resp 18   LMP 03/30/2016 (Exact Date)   SpO2 100%   Physical Exam  Constitutional:  She is oriented to person, place, and time. She appears well-developed and well-nourished. No distress.  HENT:  Head: Normocephalic and atraumatic.  Nose: Nose normal.  Mouth/Throat: Oropharynx is clear and moist. No oropharyngeal exudate.  Eyes: Conjunctivae and EOM are normal. Pupils are equal, round, and reactive to light.  Neck: Normal range of motion. Neck supple. No tracheal deviation present.  Cardiovascular: Normal rate.   Pulmonary/Chest: Effort normal. No stridor. No respiratory distress.  Musculoskeletal: Normal range of motion.  Neurological: She is alert and oriented to person, place, and time.  Skin: Skin is warm and dry. Capillary refill takes less than 2 seconds. She is not diaphoretic.  Four scattered  0.5-1cm erythematous maculoapular rah on left arm. One small excoriated erythematous 1 cm macule on posterior neck All areas w/o induration, edema, warmth, fluctuance or drainage  Psychiatric: She has a normal mood and affect. Her behavior is normal.  Nursing note and vitals reviewed.    ED Treatments / Results  Labs (all labs ordered are listed, but only abnormal results are displayed) Labs Reviewed - No data to display  EKG  EKG Interpretation None       Radiology No results found.  Procedures Procedures (including critical care time)  DIAGNOSTIC STUDIES: Oxygen Saturation is 100% on RA, normal by my interpretation.    COORDINATION OF CARE: 7:52 PM Discussed treatment plan with pt at bedside and pt agreed to plan.   Medications Ordered in ED Medications - No data to display   Initial Impression / Assessment and Plan / ED Course  I have reviewed the triage vital signs and the nursing notes.  Pertinent labs & imaging results that were available during my care of the patient were reviewed by me and considered in my medical decision making (see chart for details).  Clinical Course   Pt with very few, barely visible, rash, though to to be bed bug  bites, to forearm and posterior neck. Patient denies any difficulty breathing or swallowing.  Pt has a patent airway without stridor and is handling secretions without difficulty; no angioedema. No blisters, no pustules, no warmth, no draining sinus tracts, no superficial abscesses, no bullous impetigo, no vesicles, no desquamation, no target lesions with dusky purpura or a central bulla. Not tender to touch. No concern for superimposed infection. No concern for SJS, TEN, TSS, tick borne illness, syphilis or other life-threatening condition. Will discharge home advised to use benadryl PO as needed for itching.  Printed info given regarding bed bugs  Pt also requested medication refill of lisinopril, given 2 week supply, states has an appointment next week, agrees  to address with PCP for monitoring.     I personally performed the services described in this documentation, which was scribed in my presence. The recorded information has been reviewed and is accurate.     Final Clinical Impressions(s) / ED Diagnoses   Final diagnoses:  Bug bites    New Prescriptions New Prescriptions   No medications on file     Delsa Grana, Hershal Coria 04/09/16 0414    Virgel Manifold, MD 04/09/16 1105

## 2016-04-02 NOTE — ED Triage Notes (Signed)
Pt states "I have bed bugs." Pt states she saw a bed bug this morning on her. Pt states she googled "bed bugs" and the picture looks exactly like it. Pt denies staying anywhere ne or receiving new furniture. Pt believes that brother may have brought them with him when he came into town. Pt states she is itching and wants something to make it stop.   Neck, arm, stomach, and lower back

## 2016-04-02 NOTE — Discharge Instructions (Signed)
Use 25 to 50 mg of benadryl every 4 to 6 hours for itching.

## 2016-04-25 ENCOUNTER — Encounter (HOSPITAL_COMMUNITY): Payer: Self-pay | Admitting: *Deleted

## 2016-04-25 ENCOUNTER — Inpatient Hospital Stay (HOSPITAL_COMMUNITY)
Admission: AD | Admit: 2016-04-25 | Discharge: 2016-04-25 | Discharge status: Against Medical Advice | Payer: Self-pay | Source: Ambulatory Visit | Attending: Obstetrics and Gynecology | Admitting: Obstetrics and Gynecology

## 2016-04-25 DIAGNOSIS — R102 Pelvic and perineal pain: Secondary | ICD-10-CM | POA: Insufficient documentation

## 2016-04-25 DIAGNOSIS — Z3202 Encounter for pregnancy test, result negative: Secondary | ICD-10-CM

## 2016-04-25 LAB — URINALYSIS, ROUTINE W REFLEX MICROSCOPIC
BILIRUBIN URINE: NEGATIVE
Glucose, UA: >1000 mg/dL — AB
Ketones, ur: NEGATIVE mg/dL
Leukocytes, UA: NEGATIVE
Nitrite: NEGATIVE
PH: 5.5 (ref 5.0–8.0)
Protein, ur: NEGATIVE mg/dL
SPECIFIC GRAVITY, URINE: 1.025 (ref 1.005–1.030)

## 2016-04-25 LAB — URINE MICROSCOPIC-ADD ON

## 2016-04-25 LAB — POCT PREGNANCY, URINE: Preg Test, Ur: NEGATIVE

## 2016-04-25 NOTE — Progress Notes (Signed)
RN went to check on pt and pt not in room. Pt's belongings are gone and gown is on bed. Pt not in bathroom.

## 2016-04-25 NOTE — MAU Note (Signed)
Tubes are tied. Hasn't had a period in 2 months.  Did not do any preg test.  Has been having really bad pain in her stomach, both sides.

## 2016-05-15 ENCOUNTER — Ambulatory Visit: Payer: BLUE CROSS/BLUE SHIELD | Admitting: Gynecology

## 2016-05-22 ENCOUNTER — Encounter: Payer: Self-pay | Admitting: Gynecology

## 2016-05-22 ENCOUNTER — Ambulatory Visit (INDEPENDENT_AMBULATORY_CARE_PROVIDER_SITE_OTHER): Payer: Self-pay | Admitting: Gynecology

## 2016-05-22 VITALS — BP 130/80

## 2016-05-22 DIAGNOSIS — Z309 Encounter for contraceptive management, unspecified: Secondary | ICD-10-CM

## 2016-05-22 DIAGNOSIS — Z3009 Encounter for other general counseling and advice on contraception: Secondary | ICD-10-CM

## 2016-05-22 NOTE — Progress Notes (Signed)
    Misty [name] 08/22/1991 [id number]        25 y.o.  G1P0010 Presents to discuss birth control. Was thinking about Nexplanon.  Past medical history,surgical history, problem list, medications, allergies, family history and social history were all reviewed and documented in the EPIC chart.  Directed ROS with pertinent positives and negatives documented in the history of present illness/assessment and plan.  Exam: Vitals:   05/22/16 1212  BP: 130/80   General appearance:  Normal   Assessment/Plan:  25 y.o. G1P0010 for birth control discussion. I reviewed all forms of contraception with her to include pill, patch, ring, Nexplanon, Depo-Provera, IUDs.  she is being followed for hypertension and diabetes currently on Invokamet, insulin, lisinopril. I reviewed the risks and benefits of each choice of contraception. During the discussion she relates that she is contemplating pregnancy in 3 or 4 months. I discussed with her that if she really is serious about proceeding with pregnancy trial in several months that really is not worth restarting a contraception now that would take one or 2 months to regulate and then stop it in anticipation of pregnancy one or 2 months. My recommendation would be to use condoms now. I also discussed her medications particularly lisinopril as far as teratogenicity and the recommendation that she switch to a more pregnancy friendly drug before achieving pregnancy. I recommended she follow up with her primary who is prescribing her medications and discuss altering them in anticipation of pregnancy trial. I also recommended that she follow up for a pre-conceptual consult with high risk obstetrics and she agrees with this and [facility]. The need to start a prenatal vitamin now was discussed and she is going to buy an over-the-counter prenatal vitamin and started now.  Greater than 50% of my time was spent in direct face to face  counseling and coordination of care with the patient.     Dara LordsFONTAINE,Eudelia Hiltunen P MD, 2:02 PM 05/22/2016

## 2016-05-22 NOTE — Patient Instructions (Signed)
Office will call to arrange a preconceptual appointment with a high risk OB doctor.

## 2016-07-28 ENCOUNTER — Emergency Department (HOSPITAL_COMMUNITY): Payer: Self-pay

## 2016-07-28 ENCOUNTER — Emergency Department (HOSPITAL_COMMUNITY)
Admission: EM | Admit: 2016-07-28 | Discharge: 2016-07-28 | Disposition: A | Discharge status: Home / Self Care | Payer: Self-pay | Attending: Emergency Medicine | Admitting: Emergency Medicine

## 2016-07-28 ENCOUNTER — Encounter (HOSPITAL_COMMUNITY): Payer: Self-pay

## 2016-07-28 DIAGNOSIS — J45909 Unspecified asthma, uncomplicated: Secondary | ICD-10-CM | POA: Insufficient documentation

## 2016-07-28 DIAGNOSIS — R1013 Epigastric pain: Secondary | ICD-10-CM

## 2016-07-28 DIAGNOSIS — R112 Nausea with vomiting, unspecified: Secondary | ICD-10-CM | POA: Insufficient documentation

## 2016-07-28 DIAGNOSIS — I10 Essential (primary) hypertension: Secondary | ICD-10-CM | POA: Insufficient documentation

## 2016-07-28 DIAGNOSIS — Z7984 Long term (current) use of oral hypoglycemic drugs: Secondary | ICD-10-CM | POA: Insufficient documentation

## 2016-07-28 DIAGNOSIS — E119 Type 2 diabetes mellitus without complications: Secondary | ICD-10-CM | POA: Insufficient documentation

## 2016-07-28 DIAGNOSIS — R197 Diarrhea, unspecified: Secondary | ICD-10-CM | POA: Insufficient documentation

## 2016-07-28 DIAGNOSIS — Z87891 Personal history of nicotine dependence: Secondary | ICD-10-CM | POA: Insufficient documentation

## 2016-07-28 LAB — URINE MICROSCOPIC-ADD ON

## 2016-07-28 LAB — URINALYSIS, ROUTINE W REFLEX MICROSCOPIC
Bilirubin Urine: NEGATIVE
Glucose, UA: >1000 mg/dL — AB
Hgb urine dipstick: NEGATIVE
Ketones, ur: 15 mg/dL — AB
Leukocytes, UA: NEGATIVE
Nitrite: NEGATIVE
Protein, ur: NEGATIVE mg/dL
Specific Gravity, Urine: >1.046 — ABNORMAL HIGH (ref 1.005–1.030)
pH: 5.5 (ref 5.0–8.0)

## 2016-07-28 LAB — COMPREHENSIVE METABOLIC PANEL WITH GFR
ALT: 17 U/L (ref 14–54)
AST: 15 U/L (ref 15–41)
Albumin: 3.8 g/dL (ref 3.5–5.0)
Alkaline Phosphatase: 103 U/L (ref 38–126)
Anion gap: 8 (ref 5–15)
BUN: 7 mg/dL (ref 6–20)
CO2: 23 mmol/L (ref 22–32)
Calcium: 8.8 mg/dL — ABNORMAL LOW (ref 8.9–10.3)
Chloride: 106 mmol/L (ref 101–111)
Creatinine, Ser: 0.71 mg/dL (ref 0.44–1.00)
GFR calc Af Amer: >60 mL/min
GFR calc non Af Amer: >60 mL/min
Glucose, Bld: 234 mg/dL — ABNORMAL HIGH (ref 65–99)
Potassium: 3.8 mmol/L (ref 3.5–5.1)
Sodium: 137 mmol/L (ref 135–145)
Total Bilirubin: 0.5 mg/dL (ref 0.3–1.2)
Total Protein: 6.6 g/dL (ref 6.5–8.1)

## 2016-07-28 LAB — CBC
HEMATOCRIT: 43.5 % (ref 36.0–46.0)
HEMOGLOBIN: 14.9 g/dL (ref 12.0–15.0)
MCH: 30.9 pg (ref 26.0–34.0)
MCHC: 34.3 g/dL (ref 30.0–36.0)
MCV: 90.2 fL (ref 78.0–100.0)
Platelets: 333 10*3/uL (ref 150–400)
RBC: 4.82 MIL/uL (ref 3.87–5.11)
RDW: 12.5 % (ref 11.5–15.5)
WBC: 5.8 10*3/uL (ref 4.0–10.5)

## 2016-07-28 LAB — CBG MONITORING, ED: Glucose-Capillary: 262 mg/dL — ABNORMAL HIGH (ref 65–99)

## 2016-07-28 LAB — LIPASE, BLOOD: Lipase: 24 U/L (ref 11–51)

## 2016-07-28 LAB — I-STAT BETA HCG BLOOD, ED (MC, WL, AP ONLY): I-stat hCG, quantitative: <5 m[IU]/mL

## 2016-07-28 MED ORDER — GI COCKTAIL ~~LOC~~
30.0000 mL | Freq: Once | ORAL | Status: AC
  Administered 2016-07-28: 30 mL via ORAL
  Filled 2016-07-28: qty 30

## 2016-07-28 MED ORDER — ONDANSETRON HCL 4 MG/2ML IJ SOLN
4.0000 mg | INTRAMUSCULAR | Status: AC
  Administered 2016-07-28: 4 mg via INTRAVENOUS
  Filled 2016-07-28: qty 2

## 2016-07-28 MED ORDER — ONDANSETRON HCL 4 MG/2ML IJ SOLN
4.0000 mg | Freq: Once | INTRAMUSCULAR | Status: AC
  Administered 2016-07-28: 4 mg via INTRAVENOUS
  Filled 2016-07-28: qty 2

## 2016-07-28 MED ORDER — SODIUM CHLORIDE 0.9 % IV BOLUS (SEPSIS)
1000.0000 mL | Freq: Once | INTRAVENOUS | Status: AC
  Administered 2016-07-28: 1000 mL via INTRAVENOUS

## 2016-07-28 MED ORDER — FENTANYL CITRATE (PF) 100 MCG/2ML IJ SOLN
100.0000 ug | Freq: Once | INTRAMUSCULAR | Status: AC
  Administered 2016-07-28: 100 ug via INTRAVENOUS
  Filled 2016-07-28: qty 2

## 2016-07-28 MED ORDER — ACETAMINOPHEN 500 MG PO TABS: 500.0000 mg | ORAL_TABLET | Freq: Four times a day (QID) | ORAL | 0 refills | Status: DC | PRN

## 2016-07-28 MED ORDER — OMEPRAZOLE 20 MG PO CPDR: 20.0000 mg | DELAYED_RELEASE_CAPSULE | Freq: Every day | ORAL | 0 refills | Status: DC

## 2016-07-28 MED ORDER — ONDANSETRON 4 MG PO TBDP: 4.0000 mg | ORAL_TABLET | Freq: Three times a day (TID) | ORAL | 0 refills | Status: DC | PRN

## 2016-07-28 MED ORDER — ACIDOPHILUS PROBIOTIC 10 MG PO TABS: 10.0000 mg | ORAL_TABLET | Freq: Three times a day (TID) | ORAL | 0 refills | Status: DC

## 2016-07-28 NOTE — ED Notes (Signed)
Pt returned from u/s

## 2016-07-28 NOTE — ED Triage Notes (Signed)
Pt presents with 2 day h/o umbilical abdominal pain.  Pt reports nausea and vomiting and "a little' diarrhea.  Pt denies any dysuria or vaginal discharge; has not checked her cbg due to no strips.

## 2016-07-28 NOTE — ED Provider Notes (Signed)
[facility] DEPT Provider Note   CSN: [id number] Arrival date & time: 07/28/16  1155     History   Chief Complaint Chief Complaint  Patient presents with  . Abdominal Pain    HPI Misty [name] is a 25 y.o. female.  Misty [name] is a 25 y.o. Female with a history of diabetes who presents to the emergency department complaining of epigastric abdominal pain beginning 2-3 days ago with associated nausea, vomiting and some diarrhea. Patient reports that do 3 days ago she began having epigastric abdominal pain with associated nausea and vomiting. She reports one loose stool 2 days ago and another loose stool yesterday. She reports several episodes of vomiting. She denies pain that is worse after eating. She currently complains of epigastric abdominal pain. No lower abdominal pain. No right upper quadrant pain. No previous abdominal surgeries. No treatments attempted prior to arrival. He reports she's been compliant on her diabetes medications. She has not been compliant with checking her blood sugar. Patient denies fevers, urinary symptoms, hematemesis, hematochezia, chest pain, shortness of breath, or rashes.   The history is provided by the patient. No language interpreter was used.  Abdominal Pain   Associated symptoms include diarrhea, nausea and vomiting. Pertinent negatives include fever, dysuria, frequency, hematuria and headaches.    Past Medical History:  Diagnosis Date  . Allergy   . Anemia 2015  . Asthma    as child  . Diabetes mellitus without complication ([place]) 0814  . Eczema   . Hypertension 09/2013    There are no active problems to display for this patient.   Past Surgical History:  Procedure Laterality Date  . NO PAST SURGERIES      OB History    Gravida Para Term Preterm AB Living   1 0 0 0 1 0   SAB TAB Ectopic Multiple Live Births   1 0 0 0         Home Medications    Prior to Admission medications   Medication Sig Start Date End Date  Taking? Authorizing Provider  blood glucose meter kit and supplies KIT Dispense based on patient and insurance preference. Use up to four times daily as directed. (FOR ICD-9 250.00, 250.01). 01/10/16  Yes Okey Regal, PA-C  Canagliflozin-Metformin HCl (INVOKAMET) 50-1000 MG TABS Take 1 tablet by mouth 2 (two) times daily. 01/10/16  Yes Jeffrey Hedges, PA-C  acetaminophen (TYLENOL) 500 MG tablet Take 1 tablet (500 mg total) by mouth every 6 (six) hours as needed. 07/28/16   Waynetta Pean, PA-C  Lactobacillus (ACIDOPHILUS PROBIOTIC) 10 MG TABS Take 10 mg by mouth 3 (three) times daily. 07/28/16   Waynetta Pean, PA-C  lisinopril (PRINIVIL,ZESTRIL) 10 MG tablet Take 1 tablet (10 mg total) by mouth daily. Patient not taking: Reported on 07/28/2016 04/02/16   Delsa Grana, PA-C  omeprazole (PRILOSEC) 20 MG capsule Take 1 capsule (20 mg total) by mouth daily. 07/28/16   Waynetta Pean, PA-C  ondansetron (ZOFRAN ODT) 4 MG disintegrating tablet Take 1 tablet (4 mg total) by mouth every 8 (eight) hours as needed for nausea or vomiting. 07/28/16   Waynetta Pean, PA-C    Family History Family History  Problem Relation Age of Onset  . Hypertension Mother   . Heart disease Mother 31    CHF  . Diabetes Mother   . Sickle cell anemia Brother   . Hypertension Maternal Grandfather   . Diabetes Maternal Grandfather   . Heart disease Maternal Grandfather   .  Diabetes Maternal Grandmother   . Heart disease Maternal Grandmother     Social History Social History  Substance Use Topics  . Smoking status: Former Smoker    Packs/day: 0.50    Years: 2.00    Quit date: 09/08/2013  . Smokeless tobacco: Never Used  . Alcohol use No     Allergies   Iodides; Morphine and related; and Shellfish allergy   Review of Systems Review of Systems  Constitutional: Negative for chills and fever.  HENT: Negative for congestion and sore throat.   Eyes: Negative for visual disturbance.  Respiratory: Negative for cough  and shortness of breath.   Cardiovascular: Negative for chest pain.  Gastrointestinal: Positive for abdominal pain, diarrhea, nausea and vomiting. Negative for blood in stool.  Genitourinary: Negative for difficulty urinating, dysuria, flank pain, frequency, hematuria, menstrual problem, pelvic pain, urgency, vaginal bleeding and vaginal discharge.  Musculoskeletal: Negative for back pain.  Skin: Negative for rash.  Neurological: Negative for headaches.     Physical Exam Updated Vital Signs BP 150/86   Pulse 62   Temp 98.9 F (37.2 C) (Oral)   Resp 18   Ht '5\' 3"'$  (1.6 m)   Wt 93.9 kg   LMP 07/23/2016 (Exact Date)   SpO2 98%   BMI 36.69 kg/m   Physical Exam  Constitutional: She appears well-developed and well-nourished. No distress.  Nontoxic appearing. Obese female.  HENT:  Head: Normocephalic and atraumatic.  Mouth/Throat: Oropharynx is clear and moist.  Eyes: Conjunctivae are normal. Pupils are equal, round, and reactive to light. Right eye exhibits no discharge. Left eye exhibits no discharge.  Neck: Neck supple.  Cardiovascular: Normal rate, regular rhythm, normal heart sounds and intact distal pulses.   Pulmonary/Chest: Effort normal and breath sounds normal. No respiratory distress. She has no wheezes. She has no rales.  Abdominal: Soft. Bowel sounds are normal. She exhibits no distension and no mass. There is tenderness. There is no rebound and no guarding.  Abdomen is soft and bowel sounds are present. Patient has mild epigastric abdominal tenderness to palpation. No right upper quadrant tenderness. No Murphy's sign. No CVA or flank tenderness. No lower abdominal tenderness. No peritoneal signs.  Musculoskeletal: She exhibits no edema.  Lymphadenopathy:    She has no cervical adenopathy.  Neurological: She is alert. Coordination normal.  Skin: Skin is warm and dry. Capillary refill takes less than 2 seconds. No rash noted. She is not diaphoretic. No erythema. No pallor.   Psychiatric: She has a normal mood and affect. Her behavior is normal.  Nursing note and vitals reviewed.    ED Treatments / Results  Labs (all labs ordered are listed, but only abnormal results are displayed) Labs Reviewed  URINALYSIS, ROUTINE W REFLEX MICROSCOPIC (NOT AT [facility]) - Abnormal; Notable for the following:       Result Value   Specific Gravity, Urine >1.046 (*)    Glucose, UA >1000 (*)    Ketones, ur 15 (*)    All other components within normal limits  URINE MICROSCOPIC-ADD ON - Abnormal; Notable for the following:    Squamous Epithelial / LPF 0-5 (*)    Bacteria, UA RARE (*)    All other components within normal limits  COMPREHENSIVE METABOLIC PANEL - Abnormal; Notable for the following:    Glucose, Bld 234 (*)    Calcium 8.8 (*)    All other components within normal limits  CBG MONITORING, ED - Abnormal; Notable for the following:    Glucose-Capillary 262 (*)  All other components within normal limits  CBC  LIPASE, BLOOD  I-STAT BETA HCG BLOOD, ED ([facility], [facility], [facility] ONLY)    EKG  EKG Interpretation None       Radiology US Abdomen Complete  Result Date: 07/28/2016 CLINICAL DATA:  Patient with epigastric pain, periumbilical pain, nausea, vomiting and diarrhea. EXAM: ABDOMEN ULTRASOUND COMPLETE COMPARISON:  None. FINDINGS: Gallbladder: No gallstones or wall thickening visualized. No sonographic Murphy sign noted by sonographer. Common bile duct: Diameter: 2.1 mm Liver: No focal lesion identified. Within normal limits in parenchymal echogenicity. IVC: No abnormality visualized. Pancreas: Visualized portion unremarkable. Spleen: Size and appearance within normal limits. Right Kidney: Length: 11.4 cm. Echogenicity within normal limits. No mass or hydronephrosis visualized. Left Kidney: Length: 12.1 cm. Echogenicity within normal limits. No mass or hydronephrosis visualized. Abdominal aorta: No aneurysm visualized. Other findings: None. IMPRESSION: Unremarkable abdominal  ultrasound. Electronically Signed   By: Annia Belt M.D.   On: 07/28/2016 16:06    Procedures Procedures (including critical care time)  Medications Ordered in ED Medications  gi cocktail (Maalox,Lidocaine,Donnatal) (not administered)  ondansetron (ZOFRAN) injection 4 mg (not administered)  sodium chloride 0.9 % bolus 1,000 mL (1,000 mLs Intravenous New Bag/Given 07/28/16 1359)  ondansetron (ZOFRAN) injection 4 mg (4 mg Intravenous Given 07/28/16 1359)  fentaNYL (SUBLIMAZE) injection 100 mcg (100 mcg Intravenous Given 07/28/16 1359)     Initial Impression / Assessment and Plan / ED Course  I have reviewed the triage vital signs and the nursing notes.  Pertinent labs & imaging results that were available during my care of the patient were reviewed by me and considered in my medical decision making (see chart for details).  Clinical Course    This is a 25 y.o. Female with a history of diabetes who presents to the emergency department complaining of epigastric abdominal pain beginning 2-3 days ago with associated nausea, vomiting and some diarrhea. Patient reports that do 3 days ago she began having epigastric abdominal pain with associated nausea and vomiting. She reports one loose stool 2 days ago and another loose stool yesterday. She reports several episodes of vomiting. She denies pain that is worse after eating. She currently complains of epigastric abdominal pain. No lower abdominal pain. No right upper quadrant pain. No previous abdominal surgeries. On exam the patient is afebrile and nontoxic appearing. Her abdomen is soft and she has very mild epigastric abdominal tenderness to palpation. No Murphy's sign. No peritoneal signs. Pregnancy test is negative. Lipase is within normal limits. CMP is remarkable only for glucose of 234. Normal anion gap. Urinalysis shows glucosuria. No evidence of infection. Abdominal ultrasound was obtained which is unremarkable. Ad reevaluation patient  reports she is feeling better. She is tolerating gram crackers and ginger ale. She tells me at times she feels like she has acid in her throat. We'll provide her with a GI cocktail prior to discharge. Plan for discharge with omeprazole, a probiotic, Tylenol and Zofran. I discussed strict and specific return precautions. I discussed diet instructions. I advised the patient to follow-up with their primary care provider this week. I advised the patient to return to the emergency department with new or worsening symptoms or new concerns. The patient verbalized understanding and agreement with plan.     Final Clinical Impressions(s) / ED Diagnoses   Final diagnoses:  Nausea vomiting and diarrhea  Epigastric abdominal pain    New Prescriptions New Prescriptions   ACETAMINOPHEN (TYLENOL) 500 MG TABLET    Take 1 tablet (500  mg total) by mouth every 6 (six) hours as needed.   LACTOBACILLUS (ACIDOPHILUS PROBIOTIC) 10 MG TABS    Take 10 mg by mouth 3 (three) times daily.   OMEPRAZOLE (PRILOSEC) 20 MG CAPSULE    Take 1 capsule (20 mg total) by mouth daily.   ONDANSETRON (ZOFRAN ODT) 4 MG DISINTEGRATING TABLET    Take 1 tablet (4 mg total) by mouth every 8 (eight) hours as needed for nausea or vomiting.     Waynetta Pean, PA-C 07/28/16 [place], MD 07/29/16 [phone]

## 2016-08-09 ENCOUNTER — Ambulatory Visit: Payer: Self-pay | Admitting: Gynecology

## 2016-09-04 ENCOUNTER — Encounter: Payer: Self-pay | Admitting: Gynecology

## 2016-09-04 ENCOUNTER — Ambulatory Visit (INDEPENDENT_AMBULATORY_CARE_PROVIDER_SITE_OTHER): Payer: BLUE CROSS/BLUE SHIELD | Admitting: Gynecology

## 2016-09-04 VITALS — BP 140/98

## 2016-09-04 DIAGNOSIS — Z23 Encounter for immunization: Secondary | ICD-10-CM

## 2016-09-04 DIAGNOSIS — Z308 Encounter for other contraceptive management: Secondary | ICD-10-CM

## 2016-09-04 MED ORDER — LO LOESTRIN FE 1 MG-10 MCG / 10 MCG PO TABS
1.0000 | ORAL_TABLET | Freq: Every day | ORAL | 2 refills | Status: DC
Start: 2016-09-04 — End: 2016-09-27

## 2016-09-04 NOTE — Addendum Note (Signed)
Addended by: Dayna BarkerGARDNER, KIMBERLY K on: 09/04/2016 10:29 AM   Modules accepted: Orders

## 2016-09-04 NOTE — Progress Notes (Signed)
    Francena HanlyKatelin I Nordmann 10/21/1990 [id number]        26 y.o.  G1P0010 presents to discuss birth control options. Had been on Depo-Provera but has not used this for over a year. Is thinking about starting on birth control pills. Being followed for hypertension and diabetes. Elevated blood pressure today noted at 140/98. Patient admits to not taking her blood pressure medicine that she ran out over a month ago. Has appointment to see her primary physician today to reinitiate.  Past medical history,surgical history, problem list, medications, allergies, family history and social history were all reviewed and documented in the EPIC chart.  Directed ROS with pertinent positives and negatives documented in the history of present illness/assessment and plan.  Exam: Vitals:   09/04/16 0958  BP: (!) 140/98   General appearance:  Normal  Assessment/Plan:  26 y.o. G1P0010 for contraceptive counseling. I again reviewed all available forms of birth control with her to include pill, patch, ring, Nexplanon, Depo-Provera and IUDs. I reviewed the risks to include thrombosis particularly with estrogen-containing products. Complications with her diabetes and hypertension may increase his risk. Also potential to exacerbate her hypertension. This weighed against the risk of pregnancy as she is sexually active and currently not using consistent contraception. I strongly recommended she consider IUD. I reviewed with involved with placement and the risks. Patient this point wants to try low-dose pills. She clearly understands the risks to include thrombosis. We'll start with Lo Loestrin 1/10 to minimize the estrogen. She'll start with her next menses. Will follow up with her primary today get started back on her blood pressure medicine. She does state that she can monitor at home and she'll continue to monitor her blood pressure and as long as it is normal on her antihypertensive then continue with the pills for now. She  scheduled to see me in 2 months regardless for her annual and will recheck her at that time. She knows if her blood pressure remains elevated that we'll need to reconsider contraceptive options.  Greater than 50% of my time was spent in direct face to face counseling and coordination of care with the patient.     Dara LordsFONTAINE,Jenefer Woerner P MD, 10:17 AM 09/04/2016

## 2016-09-04 NOTE — Patient Instructions (Addendum)
Monitor your blood pressure.  Follow up if continues elevated

## 2016-09-05 ENCOUNTER — Ambulatory Visit: Payer: Self-pay | Admitting: Gynecology

## 2016-09-27 ENCOUNTER — Telehealth: Payer: Self-pay | Admitting: *Deleted

## 2016-09-27 MED ORDER — NORETHINDRONE ACET-ETHINYL EST 1-20 MG-MCG PO TABS: 1.0000 | ORAL_TABLET | Freq: Every day | ORAL | 2 refills | Status: DC

## 2016-09-27 NOTE — Telephone Encounter (Signed)
Pt lo Loestrin Fe 1/10 is no longer covered with insurance, pt asked if another Rx for birth control pills can be sent to pharmacy? Please advise

## 2016-09-27 NOTE — Telephone Encounter (Signed)
Pt informed, Rx sent. 

## 2016-09-27 NOTE — Telephone Encounter (Signed)
Loestrin 1/20 equivalent 

## 2016-10-11 ENCOUNTER — Ambulatory Visit: Payer: Self-pay | Admitting: Gynecology

## 2016-11-05 ENCOUNTER — Encounter: Payer: BLUE CROSS/BLUE SHIELD | Admitting: Gynecology

## 2016-12-06 ENCOUNTER — Ambulatory Visit (INDEPENDENT_AMBULATORY_CARE_PROVIDER_SITE_OTHER): Payer: BLUE CROSS/BLUE SHIELD | Admitting: Gynecology

## 2016-12-06 ENCOUNTER — Encounter: Payer: Self-pay | Admitting: Gynecology

## 2016-12-06 VITALS — BP 144/90 | Ht 65.0 in | Wt 217.0 lb

## 2016-12-06 DIAGNOSIS — Z3009 Encounter for other general counseling and advice on contraception: Secondary | ICD-10-CM

## 2016-12-06 DIAGNOSIS — Z01419 Encounter for gynecological examination (general) (routine) without abnormal findings: Secondary | ICD-10-CM | POA: Diagnosis not present

## 2016-12-06 DIAGNOSIS — Z113 Encounter for screening for infections with a predominantly sexual mode of transmission: Secondary | ICD-10-CM

## 2016-12-06 MED ORDER — NORETHINDRONE ACET-ETHINYL EST 1-20 MG-MCG PO TABS: 1.0000 | ORAL_TABLET | Freq: Every day | ORAL | 2 refills | Status: DC

## 2016-12-06 NOTE — Progress Notes (Signed)
    Misty [name] November 21, 1990 [id number]        26 y.o.  G1P0010 for annual exam.    Past medical history,surgical history, problem list, medications, allergies, family history and social history were all reviewed and documented as reviewed in the EPIC chart.  ROS:  Performed with pertinent positives and negatives included in the history, assessment and plan.   Additional significant findings :  None   Exam: Kennon Portela assistant Vitals:   12/06/16 1008  BP: (!) 144/90  Weight: 217 lb (98.4 kg)  Height:  (1.651 m)   Body mass index is 36.11 kg/m.  General appearance:  Normal affect, orientation and appearance. Skin: Grossly normal HEENT: Without gross lesions.  No cervical or supraclavicular adenopathy. Thyroid normal.  Lungs:  Clear without wheezing, rales or rhonchi Cardiac: RR, without RMG Abdominal:  Soft, nontender, without masses, guarding, rebound, organomegaly or hernia Breasts:  Examined lying and sitting without masses, retractions, discharge or axillary adenopathy. Pelvic:  Ext, BUS, Vagina: Normal  Cervix: Normal. GC/Chlamydia done  Uterus: Anteverted, normal size, shape and contour, midline and mobile nontender   Adnexa: Without masses or tenderness    Anus and perineum: Normal    Assessment/Plan:  26 y.o. G45P0010 female for annual exam with regular menses on oral contraceptives.   1. Contraception. Patient on 1/20 equivalent. Blood pressure remains elevated at 144/90. Actively being managed for this. Also been treated for diabetes. I again reviewed contraceptive options and the risks of oral contraceptives to include stroke heart attack DVT. Possible increased risk due to her medical issues. I strongly recommend she consider IUD. I reviewed the placement procedure and risks to include infection, perforation/migration requiring surgery to remove, hormonal side effects and the risks of failure with pregnancy. I did refill her pills for 3 months to allow her  time to arrange for the IUD which she agrees to do. 2. STD screening. GC/Chlamydia done. Serum screening declined. Need for condoms regardless reviewed. 3. Breast health. SBE monthly reviewed. 4. Gardasil series reportedly received. 5. Health maintenance. No routine lab work done as this is done through her primary provider who monitors her blood pressure and diabetes. Follow up for IUD placement appointment as arranged. Continue to monitor blood pressure and follow up with the primary provider if remains elevated.   Dara Lords MD, 10:45 AM 12/06/2016

## 2016-12-06 NOTE — Addendum Note (Signed)
Addended by: Dayna Barker on: 12/06/2016 12:21 PM   Modules accepted: Orders

## 2016-12-06 NOTE — Patient Instructions (Signed)
Follow up for IUD appointment

## 2016-12-07 LAB — GC/CHLAMYDIA PROBE AMP
CT PROBE, AMP APTIMA: NOT DETECTED
GC PROBE AMP APTIMA: NOT DETECTED

## 2016-12-31 ENCOUNTER — Ambulatory Visit: Payer: BLUE CROSS/BLUE SHIELD | Admitting: Gynecology

## 2017-01-24 ENCOUNTER — Ambulatory Visit: Payer: BLUE CROSS/BLUE SHIELD | Admitting: Obstetrics & Gynecology

## 2017-03-29 ENCOUNTER — Encounter: Payer: Self-pay | Admitting: Obstetrics & Gynecology

## 2017-03-29 ENCOUNTER — Ambulatory Visit (INDEPENDENT_AMBULATORY_CARE_PROVIDER_SITE_OTHER): Payer: BLUE CROSS/BLUE SHIELD | Admitting: Obstetrics & Gynecology

## 2017-03-29 VITALS — BP 128/90

## 2017-03-29 DIAGNOSIS — I10 Essential (primary) hypertension: Secondary | ICD-10-CM | POA: Diagnosis not present

## 2017-03-29 DIAGNOSIS — N945 Secondary dysmenorrhea: Secondary | ICD-10-CM

## 2017-03-29 DIAGNOSIS — E119 Type 2 diabetes mellitus without complications: Secondary | ICD-10-CM

## 2017-03-29 DIAGNOSIS — Z3009 Encounter for other general counseling and advice on contraception: Secondary | ICD-10-CM

## 2017-03-29 MED ORDER — IBUPROFEN 800 MG PO TABS: 800.0000 mg | ORAL_TABLET | Freq: Three times a day (TID) | ORAL | 1 refills | Status: DC | PRN

## 2017-03-29 NOTE — Progress Notes (Signed)
    Misty HanlyKatelin I [name] 11/12/1990 [id number]        26 y.o.  G1P0010 stable boyfriend.  RP:  Dysmenorrhea on cyclic BCPs  HPI:  On Junel 4/091/20 with painful cramping during menses.  Started her period this am.  Pain from cramps are not controled with Ibuprofen.  Previously well on DepoProvera, but on it x >5 yrs, d/ced to avoid permanent Bone loss.  BMI 36+ per last visit 12/2016, cHTN on meds, DM on Metformin.   Past medical history,surgical history, problem list, medications, allergies, family history and social history were all reviewed and documented in the EPIC chart.  Directed ROS with pertinent positives and negatives documented in the history of present illness/assessment and plan.  Exam:  Vitals:   03/29/17 1407  BP: 128/90   General appearance:  Normal  Abdo:  Soft, NT  Gyn exam:  Vulva normal                     Bimanual exam:  Uterus AV, normal volume, Mildly tender, mobile.  No adnexal mass/NT.  Assessment/Plan:  26 y.o. G1P0010   1. Secondary dysmenorrhea Prescription of Ibuprofen 800 mg PO q8 hours sent.    2. Encounter for other general counseling or advice on contraception Patient ready to switch to Mirena IUD given her risk factors.  Diastolic BP 90 today on Junel 8/111/20.  Will schedule Mirena IUD insertion early next week.  Insertion procedure/risks/benefits reviewed and also discussed with Dr Katherina RightFontain 12/2016.  3. Essential hypertension On Rx.  4. Type 2 diabetes mellitus without complication, without long-term current use of insulin (HCC) On Metformin.  Counseling on above issues >50% x 25 minutes.  [name] MD, 2:27 PM 03/29/2017

## 2017-03-29 NOTE — Patient Instructions (Signed)
1. Secondary dysmenorrhea Prescription of Ibuprofen 800 mg PO q8 hours sent.    2. Encounter for other general counseling or advice on contraception Patient ready to switch to Mirena IUD given her risk factors.  Diastolic BP 90 today on Junel 4/091/20.  Will schedule Mirena IUD insertion early next week.  Insertion procedure/risks/benefits reviewed and also discussed with Dr Katherina RightFontain 12/2016.  3. Essential hypertension On Rx.  4. Type 2 diabetes mellitus without complication, without long-term current use of insulin (HCC) On Metformin.  Misty [name], it was a pleasure to meet you today.

## 2017-04-02 ENCOUNTER — Ambulatory Visit: Payer: BLUE CROSS/BLUE SHIELD | Admitting: Obstetrics & Gynecology

## 2017-06-18 ENCOUNTER — Encounter (HOSPITAL_COMMUNITY): Payer: Self-pay | Admitting: *Deleted

## 2017-06-18 ENCOUNTER — Ambulatory Visit (HOSPITAL_COMMUNITY)
Admission: EM | Admit: 2017-06-18 | Discharge: 2017-06-18 | Disposition: A | Discharge status: Home / Self Care | Payer: BLUE CROSS/BLUE SHIELD | Attending: Family Medicine | Admitting: Family Medicine

## 2017-06-18 DIAGNOSIS — J020 Streptococcal pharyngitis: Secondary | ICD-10-CM | POA: Diagnosis not present

## 2017-06-18 LAB — POCT RAPID STREP A: Streptococcus, Group A Screen (Direct): POSITIVE — AB

## 2017-06-18 MED ORDER — AMOXICILLIN 875 MG PO TABS: 875.0000 mg | ORAL_TABLET | Freq: Two times a day (BID) | ORAL | 0 refills | Status: AC

## 2017-06-18 NOTE — ED Triage Notes (Signed)
Patient reports sore throat x 1 day. Took otc meds this am. Reports fever at home.

## 2017-06-18 NOTE — ED Provider Notes (Addendum)
[facility]   [id number] 06/18/17 Arrival Time: 1157  ASSESSMENT & PLAN:  1. Strep throat     Meds ordered this encounter  Medications  . amoxicillin (AMOXIL) 875 MG tablet    Sig: Take 1 tablet (875 mg total) by mouth 2 (two) times daily.    Dispense:  20 tablet    Refill:  0    Rapid Strep: Positive  OTC analgesics and throat care as needed. Instructed to finish full 10 day course of antibiotics. Will follow up if not showing significant improvement over the next 24-48 hours.  Reviewed expectations re: course of current medical issues. Questions answered. Outlined signs and symptoms indicating need for more acute intervention. Patient verbalized understanding. After Visit Summary given.   SUBJECTIVE:  Misty [name] is a 26 y.o. female who reports a sore throat. Onset abrupt beginning 1 day ago. Subjective fever. No n/v. Tolerating PO intake. No respiratory symptoms. OTC Tylenol with mild.  ROS: As per HPI.   OBJECTIVE:  Vitals:   06/18/17 1303  BP: 120/78  Pulse: (!) 57  Resp: 17  Temp: 98.6 F (37 C)  TempSrc: Oral  SpO2: 100%    General appearance: alert; no distress HEENT: tonsillar hypertrophy, moderate erythema and exudates present Neck: supple with FROM; cervical LAD, tender Lungs: clear to auscultation bilaterally Skin: reveals no rash; warm and dry Psychological: alert and cooperative; normal mood and affect  Results for orders placed or performed during the hospital encounter of 06/18/17  POCT rapid strep A [facility])  Result Value Ref Range   Streptococcus, Group A Screen (Direct) POSITIVE (A) NEGATIVE    Labs Reviewed  POCT RAPID STREP A - Abnormal; Notable for the following:       Result Value   Streptococcus, Group A Screen (Direct) POSITIVE (*)    All other components within normal limits    No results found.  Allergies  Allergen Reactions  . Iodides Anaphylaxis and Itching  . Morphine And Related Anaphylaxis  and Itching  . Shellfish Allergy Anaphylaxis and Itching    Pt reports "watery eyes"    Past Medical History:  Diagnosis Date  . Allergy   . Anemia 2015  . Asthma    as child  . Diabetes mellitus without complication (HCC) 2015  . Eczema   . Hypertension 09/2013   Social History   Social History  . Marital status: Single    Spouse name: N/A  . Number of children: N/A  . Years of education: N/A   Occupational History  . Not on file.   Social History Main Topics  . Smoking status: Former Smoker    Packs/day: 0.50    Years: 2.00    Quit date: 09/08/2013  . Smokeless tobacco: Never Used  . Alcohol use No  . Drug use: No  . Sexual activity: Yes    Birth control/ protection: Condom, , Pill     Comment: 1st intercourse 75 yo--1 partner ( has been same partner 3 yrs)    Other Topics Concern  . Not on file   Social History Narrative   Works at [employer] History  Problem Relation Age of Onset  . Hypertension Mother   . Heart disease Mother 14       CHF  . Diabetes Mother   . Sickle cell anemia Brother   . Hypertension Maternal Grandfather   . Diabetes Maternal Grandfather   . Heart disease Maternal Grandfather   .  Diabetes Maternal Grandmother   . Heart disease Maternal Dulce Sellar, MD 06/18/17 1700    Mardella Layman, MD 06/18/17 [phone]

## 2017-06-26 ENCOUNTER — Ambulatory Visit (INDEPENDENT_AMBULATORY_CARE_PROVIDER_SITE_OTHER): Payer: BLUE CROSS/BLUE SHIELD | Admitting: Gynecology

## 2017-06-26 DIAGNOSIS — Z23 Encounter for immunization: Secondary | ICD-10-CM | POA: Diagnosis not present

## 2017-06-28 ENCOUNTER — Ambulatory Visit: Payer: BLUE CROSS/BLUE SHIELD

## 2017-09-02 ENCOUNTER — Ambulatory Visit: Payer: BLUE CROSS/BLUE SHIELD | Admitting: Gynecology

## 2017-09-02 ENCOUNTER — Encounter: Payer: Self-pay | Admitting: Gynecology

## 2017-09-02 VITALS — BP 118/76

## 2017-09-02 DIAGNOSIS — N946 Dysmenorrhea, unspecified: Secondary | ICD-10-CM | POA: Diagnosis not present

## 2017-09-02 MED ORDER — NORETHINDRONE ACET-ETHINYL EST 1-20 MG-MCG PO TABS: 1.0000 | ORAL_TABLET | Freq: Every day | ORAL | 2 refills | Status: DC

## 2017-09-02 MED ORDER — TRAMADOL HCL 50 MG PO TABS: 50.0000 mg | ORAL_TABLET | Freq: Four times a day (QID) | ORAL | 0 refills | Status: DC | PRN

## 2017-09-02 NOTE — Patient Instructions (Signed)
Take the birth control pills continuously for 3 months and have a period every 3 months to see if this does not help with the pain.  Take the tramadol medication for discomfort.  Report any issues as far as rashes or itching.  Follow-up for the ultrasound as scheduled.

## 2017-09-02 NOTE — Progress Notes (Signed)
    Misty [name] 11/21/1990 [id number]        26 y.o.  G1P0010 presents to discuss her painful periods.  Been an issue with her.  Is on Loestrin 1/20 equivalent.  Had been thinking about Mirena IUD.  She also now has some question about whether she wants to restart Depo-Provera that she had used in the past and suppressed her menses.  Past medical history,surgical history, problem list, medications, allergies, family history and social history were all reviewed and documented in the EPIC chart.  Directed ROS with pertinent positives and negatives documented in the history of present illness/assessment and plan.  Exam: [name] assistant Vitals:   09/02/17 1205  BP: 118/76   General appearance:  Normal Abdomen soft nontender without masses guarding rebound Pelvic external BUS vagina normal with menses flow.  Cervix grossly normal.  Uterus normal size midline mobile nontender.  Adnexa without masses or tenderness.  Assessment/Plan:  26 y.o. G1P0010 with dysmenorrhea.  Tried ibuprofen 800 mg previously and said it did not help.  Is not having pain in between her periods.  Is not having dyspareunia.  Recommended baseline ultrasound for pelvic surveillance rule out nonpalpable abnormalities and she will schedule this.  Options for menstrual suppression again reviewed to include continuous pills with every 4472-month withdrawal or possibly continuous always, restart Depo-Provera, Mirena IUD all reviewed.  At this point patient wants trial of continuous oral contraceptives.  We will do an every 3672-month withdrawal for now.  She is due for her annual in April and will go ahead and start the continuous regimen now and see how she does with that and will follow-up in April.  Refill for her pills provided for now to allow for continuous use.  We will follow-up for ultrasound to rule out nonpalpable abnormalities.  Will try tramadol 50 mg #30 with no refill for her pain for now.  She does list morphine as  an allergy with rash noting that apparently her mother gave her a sample of this when she was having pain previously.  She does note though that she has taken Percocet and had no issues as far as rash swelling or itching.  She is okay with trying tramadol.    Dara Lordsimothy P Latrelle Bazar MD, 12:45 PM 09/02/2017

## 2017-09-05 ENCOUNTER — Telehealth: Payer: Self-pay | Admitting: *Deleted

## 2017-09-05 NOTE — Telephone Encounter (Addendum)
I prefer not to use any narcotic for menstrual pain because this can become addictive.  Let us try Celebrex 200 mg 2 p.o. at onset of pain and then 1 p.o. every 12 hours as needed afterwards for pain #30 with 1 refill

## 2017-09-05 NOTE — Telephone Encounter (Signed)
Patient was prescribed ultram 50 mg tablet on OV 09/02/17, pt said she had allergic reaction to medication, hives,swollen lip,she did stop medication. Pt said you were going to prescribe Percocet, asked if you could do this now since reaction to ultram. [facility].  Please advise

## 2017-09-06 MED ORDER — CELECOXIB 200 MG PO CAPS: ORAL_CAPSULE | ORAL | 1 refills | Status: DC

## 2017-09-06 NOTE — Telephone Encounter (Signed)
Patient informed, Rx sent.  

## 2017-09-06 NOTE — Telephone Encounter (Signed)
Left message for pt to call.

## 2017-09-11 ENCOUNTER — Encounter (HOSPITAL_COMMUNITY): Payer: Self-pay

## 2017-09-11 ENCOUNTER — Inpatient Hospital Stay (HOSPITAL_COMMUNITY)
Admission: AD | Admit: 2017-09-11 | Discharge: 2017-09-12 | Disposition: A | Discharge status: Home / Self Care | Payer: BLUE CROSS/BLUE SHIELD | Source: Ambulatory Visit | Attending: Obstetrics & Gynecology | Admitting: Obstetrics & Gynecology

## 2017-09-11 DIAGNOSIS — Z833 Family history of diabetes mellitus: Secondary | ICD-10-CM | POA: Diagnosis not present

## 2017-09-11 DIAGNOSIS — R112 Nausea with vomiting, unspecified: Secondary | ICD-10-CM

## 2017-09-11 DIAGNOSIS — R630 Anorexia: Secondary | ICD-10-CM

## 2017-09-11 DIAGNOSIS — Z8249 Family history of ischemic heart disease and other diseases of the circulatory system: Secondary | ICD-10-CM | POA: Diagnosis not present

## 2017-09-11 DIAGNOSIS — Z832 Family history of diseases of the blood and blood-forming organs and certain disorders involving the immune mechanism: Secondary | ICD-10-CM | POA: Insufficient documentation

## 2017-09-11 DIAGNOSIS — Z79899 Other long term (current) drug therapy: Secondary | ICD-10-CM | POA: Insufficient documentation

## 2017-09-11 DIAGNOSIS — Z7984 Long term (current) use of oral hypoglycemic drugs: Secondary | ICD-10-CM | POA: Diagnosis not present

## 2017-09-11 DIAGNOSIS — I10 Essential (primary) hypertension: Secondary | ICD-10-CM | POA: Diagnosis not present

## 2017-09-11 DIAGNOSIS — R102 Pelvic and perineal pain: Secondary | ICD-10-CM | POA: Insufficient documentation

## 2017-09-11 DIAGNOSIS — J45909 Unspecified asthma, uncomplicated: Secondary | ICD-10-CM | POA: Insufficient documentation

## 2017-09-11 DIAGNOSIS — E1165 Type 2 diabetes mellitus with hyperglycemia: Secondary | ICD-10-CM | POA: Insufficient documentation

## 2017-09-11 DIAGNOSIS — R1033 Periumbilical pain: Secondary | ICD-10-CM

## 2017-09-11 DIAGNOSIS — Z885 Allergy status to narcotic agent status: Secondary | ICD-10-CM | POA: Diagnosis not present

## 2017-09-11 DIAGNOSIS — Z91013 Allergy to seafood: Secondary | ICD-10-CM | POA: Diagnosis not present

## 2017-09-11 DIAGNOSIS — Z791 Long term (current) use of non-steroidal anti-inflammatories (NSAID): Secondary | ICD-10-CM | POA: Diagnosis not present

## 2017-09-11 DIAGNOSIS — Z87891 Personal history of nicotine dependence: Secondary | ICD-10-CM | POA: Insufficient documentation

## 2017-09-11 DIAGNOSIS — R739 Hyperglycemia, unspecified: Secondary | ICD-10-CM | POA: Diagnosis not present

## 2017-09-11 DIAGNOSIS — Z888 Allergy status to other drugs, medicaments and biological substances status: Secondary | ICD-10-CM | POA: Diagnosis not present

## 2017-09-11 LAB — COMPREHENSIVE METABOLIC PANEL
ALT: 18 U/L (ref 14–54)
ANION GAP: 8 (ref 5–15)
AST: 18 U/L (ref 15–41)
Albumin: 3.7 g/dL (ref 3.5–5.0)
Alkaline Phosphatase: 93 U/L (ref 38–126)
BUN: 6 mg/dL (ref 6–20)
CHLORIDE: 97 mmol/L — AB (ref 101–111)
CO2: 24 mmol/L (ref 22–32)
Calcium: 8.6 mg/dL — ABNORMAL LOW (ref 8.9–10.3)
Creatinine, Ser: 0.68 mg/dL (ref 0.44–1.00)
Glucose, Bld: 452 mg/dL — ABNORMAL HIGH (ref 65–99)
POTASSIUM: 4.4 mmol/L (ref 3.5–5.1)
Sodium: 129 mmol/L — ABNORMAL LOW (ref 135–145)
Total Bilirubin: 0.4 mg/dL (ref 0.3–1.2)
Total Protein: 7.1 g/dL (ref 6.5–8.1)

## 2017-09-11 LAB — CBC WITH DIFFERENTIAL/PLATELET
Basophils Absolute: 0 10*3/uL (ref 0.0–0.1)
Basophils Relative: 0 %
EOS PCT: 2 %
Eosinophils Absolute: 0.1 10*3/uL (ref 0.0–0.7)
HCT: 36.9 % (ref 36.0–46.0)
Hemoglobin: 12.4 g/dL (ref 12.0–15.0)
LYMPHS ABS: 2.4 10*3/uL (ref 0.7–4.0)
LYMPHS PCT: 42 %
MCH: 29.8 pg (ref 26.0–34.0)
MCHC: 33.6 g/dL (ref 30.0–36.0)
MCV: 88.7 fL (ref 78.0–100.0)
MONO ABS: 0.3 10*3/uL (ref 0.1–1.0)
Monocytes Relative: 5 %
Neutro Abs: 2.9 10*3/uL (ref 1.7–7.7)
Neutrophils Relative %: 51 %
Platelets: 313 10*3/uL (ref 150–400)
RBC: 4.16 MIL/uL (ref 3.87–5.11)
RDW: 12.9 % (ref 11.5–15.5)
WBC: 5.7 10*3/uL (ref 4.0–10.5)

## 2017-09-11 LAB — URINALYSIS, ROUTINE W REFLEX MICROSCOPIC
BILIRUBIN URINE: NEGATIVE
Bacteria, UA: NONE SEEN
Glucose, UA: >=500 mg/dL — AB
HGB URINE DIPSTICK: NEGATIVE
Ketones, ur: NEGATIVE mg/dL
Leukocytes, UA: NEGATIVE
NITRITE: NEGATIVE
Protein, ur: NEGATIVE mg/dL
SPECIFIC GRAVITY, URINE: 1.035 — AB (ref 1.005–1.030)
pH: 7 (ref 5.0–8.0)

## 2017-09-11 LAB — GLUCOSE, CAPILLARY
GLUCOSE-CAPILLARY: 388 mg/dL — AB (ref 65–99)
Glucose-Capillary: 437 mg/dL — ABNORMAL HIGH (ref 65–99)

## 2017-09-11 LAB — POCT PREGNANCY, URINE: Preg Test, Ur: NEGATIVE

## 2017-09-11 MED ORDER — HYOSCYAMINE SULFATE 0.125 MG SL SUBL
0.1250 mg | SUBLINGUAL_TABLET | Freq: Once | SUBLINGUAL | Status: AC
  Administered 2017-09-11: 0.125 mg via SUBLINGUAL
  Filled 2017-09-11: qty 1

## 2017-09-11 MED ORDER — INSULIN ASPART 100 UNIT/ML ~~LOC~~ SOLN
10.0000 [IU] | Freq: Once | SUBCUTANEOUS | Status: AC
  Administered 2017-09-11: 10 [IU] via SUBCUTANEOUS
  Filled 2017-09-11: qty 1

## 2017-09-11 MED ORDER — INSULIN REGULAR HUMAN 100 UNIT/ML IJ SOLN: 10.0000 [IU] | Freq: Once | INTRAMUSCULAR | Status: DC

## 2017-09-11 MED ORDER — KETOROLAC TROMETHAMINE 60 MG/2ML IM SOLN
60.0000 mg | Freq: Once | INTRAMUSCULAR | Status: AC
  Administered 2017-09-11: 60 mg via INTRAMUSCULAR
  Filled 2017-09-11: qty 2

## 2017-09-11 MED ORDER — AMLODIPINE BESYLATE 5 MG PO TABS
5.0000 mg | ORAL_TABLET | Freq: Once | ORAL | Status: DC
  Filled 2017-09-11: qty 1

## 2017-09-11 NOTE — MAU Note (Signed)
Pt c/o nausea for and lower abdominal pain for 1-2 weeks. States she has not has not had an appetite. Pt denies vaginal bleeding or discharge. Pt denies urinary s/s. LMP: 09/05/2016. Pt states she has painful periods and was prescribed pain medication, but has not taken yet.

## 2017-09-11 NOTE — MAU Note (Signed)
Pt denies wanting any STD checks.

## 2017-09-11 NOTE — MAU Provider Note (Signed)
Chief Complaint:  Pelvic Pain and Nausea   First Provider Initiated Contact with Patient 09/11/17 2037      HPI: Misty [name] is a 27 y.o. G1P0010 who presents to maternity admissions reporting nausea and periumbilical abdominal pain for 1-2 weeks.  This is not the same pelvic pain she usually has with menses.  Has had decreased appetite this week.  Reports nausea and vomiting.  She reports no vaginal bleeding, vaginal itching/burning, urinary symptoms, h/a, dizziness, or fever/chills.    Has HTN and Diabetes.  States did take her Norvasc and Metformin.  Has not seen her primary doctor in a while.   Pelvic Pain  The patient's primary symptoms include pelvic pain. The patient's pertinent negatives include no genital itching, genital lesions, genital odor, vaginal bleeding or vaginal discharge. This is a new problem. The current episode started 1 to 4 weeks ago. The problem has been unchanged. The pain is moderate. Associated symptoms include abdominal pain and anorexia. Pertinent negatives include no chills, constipation, diarrhea, dysuria, fever, headaches, nausea or vomiting. The symptoms are aggravated by tactile pressure. She has tried nothing for the symptoms.    RN Note: Pt c/o nausea for and lower abdominal pain for 1-2 weeks. States she has not has not had an appetite. Pt denies vaginal bleeding or discharge. Pt denies urinary s/s. LMP: 09/05/2016. Pt states she has painful periods and was prescribed pain medication, but has not taken yet.     Past Medical History: Past Medical History:  Diagnosis Date  . Allergy   . Anemia 2015  . Asthma    as child  . Diabetes mellitus without complication ([facility]) 2992  . Eczema   . Hypertension 09/2013    Past obstetric history: OB History  Gravida Para Term Preterm AB Living  1 0 0 0 1 0  SAB TAB Ectopic Multiple Live Births  1 0 0 0      # Outcome Date GA Lbr Len/2nd Weight Sex Delivery Anes PTL Lv  1 SAB               Past  Surgical History: Past Surgical History:  Procedure Laterality Date  . NO PAST SURGERIES      Family History: Family History  Problem Relation Age of Onset  . Hypertension Mother   . Heart disease Mother 58       CHF  . Diabetes Mother   . Sickle cell anemia Brother   . Hypertension Maternal Grandfather   . Diabetes Maternal Grandfather   . Heart disease Maternal Grandfather   . Diabetes Maternal Grandmother   . Heart disease Maternal Grandmother     Social History: Social History   Tobacco Use  . Smoking status: Former Smoker    Packs/day: 0.50    Years: 2.00    Pack years: 1.00    Last attempt to quit: 09/08/2013    Years since quitting: 4.0  . Smokeless tobacco: Never Used  Substance Use Topics  . Alcohol use: No    Alcohol/week: 0.0 oz  . Drug use: No    Allergies:  Allergies  Allergen Reactions  . Iodides Anaphylaxis and Itching  . Morphine And Related Anaphylaxis and Itching  . Shellfish Allergy Anaphylaxis and Itching    Pt reports "watery eyes"  . Ultram [Tramadol Hcl] Hives    Hives and swollen lips     Meds:  Medications Prior to Admission  Medication Sig Dispense Refill Last Dose  . amLODipine (NORVASC) 5  MG tablet Take 5 mg by mouth daily.   Taking  . blood glucose meter kit and supplies KIT Dispense based on patient and insurance preference. Use up to four times daily as directed. (FOR ICD-9 250.00, 250.01). 1 each 0 Taking  . celecoxib (CELEBREX) 200 MG capsule Take 2 tablets by mouth at onset of pain and then 1 tablet every 12 hours as needed afterwards for pain 30 capsule 1   . ibuprofen (ADVIL,MOTRIN) 800 MG tablet Take 1 tablet (800 mg total) by mouth every 8 (eight) hours as needed. 30 tablet 1 Taking  . metFORMIN (GLUCOPHAGE) 500 MG tablet Take by mouth 2 (two) times daily with a meal.   Taking  . norethindrone-ethinyl estradiol (MICROGESTIN,JUNEL,LOESTRIN) 1-20 MG-MCG tablet Take 1 tablet by mouth daily. 2 Package 2   . traZODone (DESYREL)  100 MG tablet Take 100 mg by mouth at bedtime.   Taking    I have reviewed patient's Past Medical Hx, Surgical Hx, Family Hx, Social Hx, medications and allergies.  ROS:  Review of Systems  Constitutional: Negative for chills and fever.  Gastrointestinal: Positive for abdominal pain and anorexia. Negative for constipation, diarrhea, nausea and vomiting.  Genitourinary: Positive for pelvic pain. Negative for dysuria and vaginal discharge.  Neurological: Negative for headaches.   Other systems negative     Physical Exam   Patient Vitals for the past 24 hrs:  BP Temp Temp src Pulse Resp SpO2 Height Weight  09/11/17 2010 (!) 155/99 98.5 F (36.9 C) Oral 78 17 100 % '5\' 3"'$  (1.6 m) 216 lb (98 kg)   Constitutional: Well-developed, well-nourished female in no acute distress.  Cardiovascular: normal rate and rhythm, no ectopy audible, S1 & S2 heard, no murmur Respiratory: normal effort, no distress. Lungs CTAB with no wheezes or crackles GI: Abd soft, mild-moderately tender around umbilicus.  Nondistended.  No rebound, No guarding.  Bowel Sounds audible  MS: Extremities nontender, no edema, normal ROM Neurologic: Alert and oriented x 4.   Grossly nonfocal. GU: Neg CVAT. Skin:  Warm and Dry Psych:  Affect appropriate.  PELVIC EXAM: Bimanual exam: Cervix firm, anterior, neg CMT, uterus nontender, nonenlarged, adnexa without tenderness, enlargement, or mass    Labs: Results for orders placed or performed during the hospital encounter of 09/11/17 (from the past 24 hour(s))  Urinalysis, Routine w reflex microscopic     Status: Abnormal   Collection Time: 09/11/17  8:07 PM  Result Value Ref Range   Color, Urine STRAW (A) YELLOW   APPearance CLEAR CLEAR   Specific Gravity, Urine 1.035 (H) 1.005 - 1.030   pH 7.0 5.0 - 8.0   Glucose, UA >=500 (A) NEGATIVE mg/dL   Hgb urine dipstick NEGATIVE NEGATIVE   Bilirubin Urine NEGATIVE NEGATIVE   Ketones, ur NEGATIVE NEGATIVE mg/dL   Protein, ur  NEGATIVE NEGATIVE mg/dL   Nitrite NEGATIVE NEGATIVE   Leukocytes, UA NEGATIVE NEGATIVE   RBC / HPF 0-5 0 - 5 RBC/hpf   WBC, UA 0-5 0 - 5 WBC/hpf   Bacteria, UA NONE SEEN NONE SEEN   Squamous Epithelial / LPF 0-5 (A) NONE SEEN  Pregnancy, urine POC     Status: None   Collection Time: 09/11/17  8:34 PM  Result Value Ref Range   Preg Test, Ur NEGATIVE NEGATIVE  CBC with Differential/Platelet     Status: None   Collection Time: 09/11/17  9:13 PM  Result Value Ref Range   WBC 5.7 4.0 - 10.5 K/uL   RBC 4.16  3.87 - 5.11 MIL/uL   Hemoglobin 12.4 12.0 - 15.0 g/dL   HCT 36.9 36.0 - 46.0 %   MCV 88.7 78.0 - 100.0 fL   MCH 29.8 26.0 - 34.0 pg   MCHC 33.6 30.0 - 36.0 g/dL   RDW 12.9 11.5 - 15.5 %   Platelets 313 150 - 400 K/uL   Neutrophils Relative % 51 %   Neutro Abs 2.9 1.7 - 7.7 K/uL   Lymphocytes Relative 42 %   Lymphs Abs 2.4 0.7 - 4.0 K/uL   Monocytes Relative 5 %   Monocytes Absolute 0.3 0.1 - 1.0 K/uL   Eosinophils Relative 2 %   Eosinophils Absolute 0.1 0.0 - 0.7 K/uL   Basophils Relative 0 %   Basophils Absolute 0.0 0.0 - 0.1 K/uL  Comprehensive metabolic panel     Status: Abnormal   Collection Time: 09/11/17  9:13 PM  Result Value Ref Range   Sodium 129 (L) 135 - 145 mmol/L   Potassium 4.4 3.5 - 5.1 mmol/L   Chloride 97 (L) 101 - 111 mmol/L   CO2 24 22 - 32 mmol/L   Glucose, Bld 452 (H) 65 - 99 mg/dL   BUN 6 6 - 20 mg/dL   Creatinine, Ser 0.68 0.44 - 1.00 mg/dL   Calcium 8.6 (L) 8.9 - 10.3 mg/dL   Total Protein 7.1 6.5 - 8.1 g/dL   Albumin 3.7 3.5 - 5.0 g/dL   AST 18 15 - 41 U/L   ALT 18 14 - 54 U/L   Alkaline Phosphatase 93 38 - 126 U/L   Total Bilirubin 0.4 0.3 - 1.2 mg/dL   GFR calc non Af Amer >60 >60 mL/min   GFR calc Af Amer >60 >60 mL/min   Anion gap 8 5 - 15  Glucose, capillary     Status: Abnormal   Collection Time: 09/11/17 11:20 PM  Result Value Ref Range   Glucose-Capillary 437 (H) 65 - 99 mg/dL  Glucose, capillary     Status: Abnormal   Collection  Time: 09/11/17 11:52 PM  Result Value Ref Range   Glucose-Capillary 388 (H) 65 - 99 mg/dL    Ref. Range 09/12/2017 01:00  Glucose-Capillary Latest Ref Range: 65 - 99 mg/dL 264 (H)   Vitals:   09/11/17 2010 09/11/17 2249  BP: (!) 155/99 131/77  Pulse: 78 69  Resp: 17   Temp: 98.5 F (36.9 C)   TempSrc: Oral   SpO2: 100%   Weight: 216 lb (98 kg)   Height: '5\' 3"'$  (1.6 m)     Imaging:  No results found.  MAU Course/MDM: I have ordered labs as follows:  See above. No leukocytosis so appendicitis is doubtful.  LFTs are normal also.   Blood sugar is quite high as is blood pressure. Imaging ordered: none Results reviewed.   Consult Dr Dellis Filbert.  She recommends conservative care for abdominal cramping but wants meds to lower BS and BP.  .   Treatments in MAU included Insulin and BP monitoring.  BP came down on its own.  Consulted ED provider who recommended Insulin 10 units at a time with recheck in 1 hour.  He recommended using same med she uses at home if BP remains high.  Since it did decrease quite a bit on its own, I held the extra dose.  Insulin 10 units given with decrease of CBG to 388.   Another 10 units given with decrease to 264.   Will discharge home and have her  resume her Metformin and call her MD first thing in am .   Pt stable at time of discharge.  Assessment: Hyperglycemia  Essential hypertension  Periumbilical abdominal pain  Appetite loss  Nausea and vomiting, intractability of vomiting not specified, unspecified vomiting type   Plan: Discharge home Recommend call primary doctor first thing in AM re:  Hyperglycemia and hypertension. Discussed dangers of severe hyperglycemia and long term effects.  Suspect the loss of appetite and nausea might be related to severe hyperglycemia.  Rx sent for Levsin for abdominal cramping (limited Rx, 3 tablets) follow up in office  Encouraged to return here or to other Urgent Care/ED if she develops worsening of symptoms,  increase in pain, fever, or other concerning symptoms.   Hansel Feinstein CNM, MSN Certified Nurse-Midwife 09/11/2017 8:37 PM

## 2017-09-12 DIAGNOSIS — R739 Hyperglycemia, unspecified: Secondary | ICD-10-CM

## 2017-09-12 DIAGNOSIS — R102 Pelvic and perineal pain: Secondary | ICD-10-CM

## 2017-09-12 DIAGNOSIS — I10 Essential (primary) hypertension: Secondary | ICD-10-CM

## 2017-09-12 LAB — GLUCOSE, CAPILLARY: GLUCOSE-CAPILLARY: 264 mg/dL — AB (ref 65–99)

## 2017-09-12 MED ORDER — HYOSCYAMINE SULFATE SL 0.125 MG SL SUBL: 1.0000 | SUBLINGUAL_TABLET | Freq: Three times a day (TID) | SUBLINGUAL | 0 refills | Status: DC | PRN

## 2017-09-12 NOTE — Discharge Instructions (Signed)
Abdominal Pain, Adult Abdominal pain can be caused by many things. Often, abdominal pain is not serious and it gets better with no treatment or by being treated at home. However, sometimes abdominal pain is serious. Your health care provider will do a medical history and a physical exam to try to determine the cause of your abdominal pain. Follow these instructions at home:  Take over-the-counter and prescription medicines only as told by your health care provider. Do not take a laxative unless told by your health care provider.  Drink enough fluid to keep your urine clear or pale yellow.  Watch your condition for any changes.  Keep all follow-up visits as told by your health care provider. This is important. Contact a health care provider if:  Your abdominal pain changes or gets worse.  You are not hungry or you lose weight without trying.  You are constipated or have diarrhea for more than 2-3 days.  You have pain when you urinate or have a bowel movement.  Your abdominal pain wakes you up at night.  Your pain gets worse with meals, after eating, or with certain foods.  You are throwing up and cannot keep anything down.  You have a fever. Get help right away if:  Your pain does not go away as soon as your health care provider told you to expect.  You cannot stop throwing up.  Your pain is only in areas of the abdomen, such as the right side or the left lower portion of the abdomen.  You have bloody or black stools, or stools that look like tar.  You have severe pain, cramping, or bloating in your abdomen.  You have signs of dehydration, such as: ? Dark urine, very little urine, or no urine. ? Cracked lips. ? Dry mouth. ? Sunken eyes. ? Sleepiness. ? Weakness. This information is not intended to replace advice given to you by your health care provider. Make sure you discuss any questions you have with your health care provider. Document Released: 05/30/2005 Document  Revised: 03/09/2016 Document Reviewed: 02/01/2016 Elsevier Interactive Patient Education  2018 [employer]   Abdominal Bloating When you have abdominal bloating, your abdomen may feel full, tight, or painful. It may also look bigger than normal or swollen (distended). Common causes of abdominal bloating include:  Swallowing air.  Constipation.  Problems digesting food.  Eating too much.  Irritable bowel syndrome. This is a condition that affects the large intestine.  Lactose intolerance. This is an inability to digest lactose, a natural sugar in dairy products.  Celiac disease. This is a condition that affects the ability to digest gluten, a protein found in some grains.  Gastroparesis. This is a condition that slows down the movement of food in the stomach and small intestine. It is more common in people with diabetes mellitus.  Gastroesophageal reflux disease (GERD). This is a digestive condition that makes stomach acid flow back into the esophagus.  Urinary retention. This means that the body is holding onto urine, and the bladder cannot be emptied all the way.  Follow these instructions at home: Eating and drinking  Avoid eating too much.  Try not to swallow air while talking or eating.  Avoid eating while lying down.  Avoid these foods and drinks: ? Foods that cause gas, such as broccoli, cabbage, cauliflower, and baked beans. ? Carbonated drinks. ? Hard candy. ? Chewing gum. Medicines  Take over-the-counter and prescription medicines only as told by your health care provider.  Take probiotic medicines. These medicines contain live bacteria or yeasts that can help digestion.  Take coated peppermint oil capsules. Activity  Try to exercise regularly. Exercise may help to relieve bloating that is caused by gas and relieve constipation. General instructions  Keep all follow-up visits as told by your health care provider. This is important. Contact a health  care provider if:  You have nausea and vomiting.  You have diarrhea.  You have abdominal pain.  You have unusual weight loss or weight gain.  You have severe pain, and medicines do not help. Get help right away if:  You have severe chest pain.  You have trouble breathing.  You have shortness of breath.  You have trouble urinating.  You have darker urine than normal.  You have blood in your stools or have dark, tarry stools. Summary  Abdominal bloating means that the abdomen is swollen.  Common causes of abdominal bloating are swallowing air, constipation, and problems digesting food.  Avoid eating too much and avoid swallowing air.  Avoid foods that cause gas, carbonated drinks, hard candy, and chewing gum. This information is not intended to replace advice given to you by your health care provider. Make sure you discuss any questions you have with your health care provider. Document Released: 09/21/2016 Document Revised: 09/21/2016 Document Reviewed: 09/21/2016 Elsevier Interactive Patient Education  2018 [employer].  Hypertension Hypertension, commonly called high blood pressure, is when the force of blood pumping through the arteries is too strong. The arteries are the blood vessels that carry blood from the heart throughout the body. Hypertension forces the heart to work harder to pump blood and may cause arteries to become narrow or stiff. Having untreated or uncontrolled hypertension can cause heart attacks, strokes, kidney disease, and other problems. A blood pressure reading consists of a higher number over a lower number. Ideally, your blood pressure should be below 120/80. The first ("top") number is called the systolic pressure. It is a measure of the pressure in your arteries as your heart beats. The second ("bottom") number is called the diastolic pressure. It is a measure of the pressure in your arteries as the heart relaxes. What are the causes? The cause of  this condition is not known. What increases the risk? Some risk factors for high blood pressure are under your control. Others are not. Factors you can change  Smoking.  Having type 2 diabetes mellitus, high cholesterol, or both.  Not getting enough exercise or physical activity.  Being overweight.  Having too much fat, sugar, calories, or salt (sodium) in your diet.  Drinking too much alcohol. Factors that are difficult or impossible to change  Having chronic kidney disease.  Having a family history of high blood pressure.  Age. Risk increases with age.  Race. You may be at higher risk if you are African-American.  Gender. Men are at higher risk than women before age 45. After age 16, women are at higher risk than men.  Having obstructive sleep apnea.  Stress. What are the signs or symptoms? Extremely high blood pressure (hypertensive crisis) may cause:  Headache.  Anxiety.  Shortness of breath.  Nosebleed.  Nausea and vomiting.  Severe chest pain.  Jerky movements you cannot control (seizures).  How is this diagnosed? This condition is diagnosed by measuring your blood pressure while you are seated, with your arm resting on a surface. The cuff of the blood pressure monitor will be placed directly against the skin of your upper arm  at the level of your heart. It should be measured at least twice using the same arm. Certain conditions can cause a difference in blood pressure between your right and left arms. Certain factors can cause blood pressure readings to be lower or higher than normal (elevated) for a short period of time:  When your blood pressure is higher when you are in a health care provider's office than when you are at home, this is called white coat hypertension. Most people with this condition do not need medicines.  When your blood pressure is higher at home than when you are in a health care provider's office, this is called masked hypertension.  Most people with this condition may need medicines to control blood pressure.  If you have a high blood pressure reading during one visit or you have normal blood pressure with other risk factors:  You may be asked to return on a different day to have your blood pressure checked again.  You may be asked to monitor your blood pressure at home for 1 week or longer.  If you are diagnosed with hypertension, you may have other blood or imaging tests to help your health care provider understand your overall risk for other conditions. How is this treated? This condition is treated by making healthy lifestyle changes, such as eating healthy foods, exercising more, and reducing your alcohol intake. Your health care provider may prescribe medicine if lifestyle changes are not enough to get your blood pressure under control, and if:  Your systolic blood pressure is above 130.  Your diastolic blood pressure is above 80.  Your personal target blood pressure may vary depending on your medical conditions, your age, and other factors. Follow these instructions at home: Eating and drinking  Eat a diet that is high in fiber and potassium, and low in sodium, added sugar, and fat. An example eating plan is called the DASH (Dietary Approaches to Stop Hypertension) diet. To eat this way: ? Eat plenty of fresh fruits and vegetables. Try to fill half of your plate at each meal with fruits and vegetables. ? Eat whole grains, such as whole wheat pasta, brown rice, or whole grain bread. Fill about one quarter of your plate with whole grains. ? Eat or drink low-fat dairy products, such as skim milk or low-fat yogurt. ? Avoid fatty cuts of meat, processed or cured meats, and poultry with skin. Fill about one quarter of your plate with lean proteins, such as fish, chicken without skin, beans, eggs, and tofu. ? Avoid premade and processed foods. These tend to be higher in sodium, added sugar, and fat.  Reduce your daily  sodium intake. Most people with hypertension should eat less than 1,500 mg of sodium a day.  Limit alcohol intake to no more than 1 drink a day for nonpregnant women and 2 drinks a day for men. One drink equals 12 oz of beer, 5 oz of wine, or 1 oz of hard liquor. Lifestyle  Work with your health care provider to maintain a healthy body weight or to lose weight. Ask what an ideal weight is for you.  Get at least 30 minutes of exercise that causes your heart to beat faster (aerobic exercise) most days of the week. Activities may include walking, swimming, or biking.  Include exercise to strengthen your muscles (resistance exercise), such as pilates or lifting weights, as part of your weekly exercise routine. Try to do these types of exercises for 30 minutes at least 3  days a week.  Do not use any products that contain nicotine or tobacco, such as cigarettes and e-cigarettes. If you need help quitting, ask your health care provider.  Monitor your blood pressure at home as told by your health care provider.  Keep all follow-up visits as told by your health care provider. This is important. Medicines  Take over-the-counter and prescription medicines only as told by your health care provider. Follow directions carefully. Blood pressure medicines must be taken as prescribed.  Do not skip doses of blood pressure medicine. Doing this puts you at risk for problems and can make the medicine less effective.  Ask your health care provider about side effects or reactions to medicines that you should watch for. Contact a health care provider if:  You think you are having a reaction to a medicine you are taking.  You have headaches that keep coming back (recurring).  You feel dizzy.  You have swelling in your ankles.  You have trouble with your vision. Get help right away if:  You develop a severe headache or confusion.  You have unusual weakness or numbness.  You feel faint.  You have  severe pain in your chest or abdomen.  You vomit repeatedly.  You have trouble breathing. Summary  Hypertension is when the force of blood pumping through your arteries is too strong. If this condition is not controlled, it may put you at risk for serious complications.  Your personal target blood pressure may vary depending on your medical conditions, your age, and other factors. For most people, a normal blood pressure is less than 120/80.  Hypertension is treated with lifestyle changes, medicines, or a combination of both. Lifestyle changes include weight loss, eating a healthy, low-sodium diet, exercising more, and limiting alcohol. This information is not intended to replace advice given to you by your health care provider. Make sure you discuss any questions you have with your health care provider. Document Released: 08/20/2005 Document Revised: 07/18/2016 Document Reviewed: 07/18/2016 Elsevier Interactive Patient Education  2018 [employer].  Hyperglycemia Hyperglycemia occurs when the level of sugar (glucose) in the blood is too high. Glucose is a type of sugar that provides the body's main source of energy. Certain hormones (insulin and glucagon) control the level of glucose in the blood. Insulin lowers blood glucose, and glucagon increases blood glucose. Hyperglycemia can result from having too little insulin in the bloodstream, or from the body not responding normally to insulin. Hyperglycemia occurs most often in people who have diabetes (diabetes mellitus), but it can happen in people who do not have diabetes. It can develop quickly, and it can be life-threatening if it causes you to become severely dehydrated (diabetic ketoacidosis or hyperglycemic hyperosmolar state). Severe hyperglycemia is a medical emergency. What are the causes? If you have diabetes, hyperglycemia may be caused by:  Diabetes medicine.  Medicines that increase blood glucose or affect your diabetes  control.  Not eating enough, or not eating often enough.  Changes in physical activity level.  Being sick or having an infection.  If you have prediabetes or undiagnosed diabetes:  Hyperglycemia may be caused by those conditions.  If you do not have diabetes, hyperglycemia may be caused by:  Certain medicines, including steroid medicines, beta-blockers, epinephrine, and thiazide diuretics.  Stress.  Serious illness.  Surgery.  Diseases of the pancreas.  Infection.  What increases the risk? Hyperglycemia is more likely to develop in people who have risk factors for diabetes, such as:  Having  a family member with diabetes.  Having a gene for type 1 diabetes that is passed from parent to child (inherited).  Living in an area with cold weather conditions.  Exposure to certain viruses.  Certain conditions in which the body's disease-fighting (immune) system attacks itself (autoimmune disorders).  Being overweight or obese.  Having an inactive (sedentary) lifestyle.  Having been diagnosed with insulin resistance.  Having a history of prediabetes, gestational diabetes, or polycystic ovarian syndrome (PCOS).  Being of American-Indian, African-American, Hispanic/Latino, or Asian/Pacific Islander descent.  What are the signs or symptoms? Hyperglycemia may not cause any symptoms. If you do have symptoms, they may include early warning signs, such as:  Increased thirst.  Hunger.  Feeling very tired.  Needing to urinate more often than usual.  Blurry vision.  Other symptoms may develop if hyperglycemia gets worse, such as:  Dry mouth.  Loss of appetite.  Fruity-smelling breath.  Weakness.  Unexpected or rapid weight gain or weight loss.  Tingling or numbness in the hands or feet.  Headache.  Skin that does not quickly return to normal after being lightly pinched and released (poor skin turgor).  Abdominal pain.  Cuts or bruises that are slow to  heal.  How is this diagnosed? Hyperglycemia is diagnosed with a blood test to measure your blood glucose level. This blood test is usually done while you are having symptoms. Your health care provider may also do a physical exam and review your medical history. You may have more tests to determine the cause of your hyperglycemia, such as:  A fasting blood glucose (FBG) test. You will not be allowed to eat (you will fast) for at least 8 hours before a blood sample is taken.  An A1c (hemoglobin A1c) blood test. This provides information about blood glucose control over the previous 2-3 months.  An oral glucose tolerance test (OGTT). This measures your blood glucose at two times: ? After fasting. This is your baseline blood glucose level. ? Two hours after drinking a beverage that contains glucose.  How is this treated? Treatment depends on the cause of your hyperglycemia. Treatment may include:  Taking medicine to regulate your blood glucose levels. If you take insulin or other diabetes medicines, your medicine or dosage may be adjusted.  Lifestyle changes, such as exercising more, eating healthier foods, or losing weight.  Treating an illness or infection, if this caused your hyperglycemia.  Checking your blood glucose more often.  Stopping or reducing steroid medicines, if these caused your hyperglycemia.  If your hyperglycemia becomes severe and it results in hyperglycemic hyperosmolar state, you must be hospitalized and given IV fluids. Follow these instructions at home: General instructions  Take over-the-counter and prescription medicines only as told by your health care provider.  Do not use any products that contain nicotine or tobacco, such as cigarettes and e-cigarettes. If you need help quitting, ask your health care provider.  Limit alcohol intake to no more than 1 drink per day for nonpregnant women and 2 drinks per day for men. One drink equals 12 oz of beer, 5 oz of  wine, or 1 oz of hard liquor.  Learn to manage stress. If you need help with this, ask your health care provider.  Keep all follow-up visits as told by your health care provider. This is important. Eating and drinking  Maintain a healthy weight.  Exercise regularly, as directed by your health care provider.  Stay hydrated, especially when you exercise, get sick, or spend  time in hot temperatures.  Eat healthy foods, such as: ? Lean proteins. ? Complex carbohydrates. ? Fresh fruits and vegetables. ? Low-fat dairy products. ? Healthy fats.  Drink enough fluid to keep your urine clear or pale yellow. If you have diabetes:   Make sure you know the symptoms of hyperglycemia.  Follow your diabetes management plan, as told by your health care provider. Make sure you: ? Take your insulin and medicines as directed. ? Follow your exercise plan. ? Follow your meal plan. Eat on time, and do not skip meals. ? Check your blood glucose as often as directed. Make sure to check your blood glucose before and after exercise. If you exercise longer or in a different way than usual, check your blood glucose more often. ? Follow your sick day plan whenever you cannot eat or drink normally. Make this plan in advance with your health care provider.  Share your diabetes management plan with people in your workplace, school, and household.  Check your urine for ketones when you are ill and as told by your health care provider.  Carry a medical alert card or wear medical alert jewelry. Contact a health care provider if:  Your blood glucose is at or above 240 mg/dL (29.5 mmol/L) for 2 days in a row.  You have problems keeping your blood glucose in your target range.  You have frequent episodes of hyperglycemia. Get help right away if:  You have difficulty breathing.  You have a change in how you think, feel, or act (mental status).  You have nausea or vomiting that does not go away. These  symptoms may represent a serious problem that is an emergency. Do not wait to see if the symptoms will go away. Get medical help right away. Call your local emergency services (911 in the [place] Do not drive yourself to the hospital. Summary  Hyperglycemia occurs when the level of sugar (glucose) in the blood is too high.  Hyperglycemia is diagnosed with a blood test to measure your blood glucose level. This blood test is usually done while you are having symptoms. Your health care provider may also do a physical exam and review your medical history.  If you have diabetes, follow your diabetes management plan as told by your health care provider.  Contact your health care provider if you have problems keeping your blood glucose in your target range. This information is not intended to replace advice given to you by your health care provider. Make sure you discuss any questions you have with your health care provider. Document Released: 02/13/2001 Document Revised: 05/07/2016 Document Reviewed: 05/07/2016 Elsevier Interactive Patient Education  [employer].

## 2017-09-18 ENCOUNTER — Ambulatory Visit: Payer: BLUE CROSS/BLUE SHIELD | Admitting: Gynecology

## 2017-09-18 ENCOUNTER — Encounter: Payer: Self-pay | Admitting: Gynecology

## 2017-09-18 ENCOUNTER — Other Ambulatory Visit: Payer: Self-pay | Admitting: Gynecology

## 2017-09-18 ENCOUNTER — Ambulatory Visit (INDEPENDENT_AMBULATORY_CARE_PROVIDER_SITE_OTHER): Payer: BLUE CROSS/BLUE SHIELD

## 2017-09-18 VITALS — BP 124/82

## 2017-09-18 DIAGNOSIS — N831 Corpus luteum cyst of ovary, unspecified side: Secondary | ICD-10-CM

## 2017-09-18 DIAGNOSIS — N946 Dysmenorrhea, unspecified: Secondary | ICD-10-CM

## 2017-09-18 NOTE — Patient Instructions (Signed)
Follow-up with your next menses for Depo-Provera shot.

## 2017-09-18 NOTE — Progress Notes (Signed)
    Misty HanlyKatelin I [name] 12/08/1990 [id number]        27 y.o.  G1P0010 presents for ultrasound due to history of worsening dysmenorrhea.  Having regular menses but significant cramping.  Recently admitted to the hospital with glucose values in the 400.  Have come down into the 200 range and she is currently being followed for this.  On metformin.  Past medical history,surgical history, problem list, medications, allergies, family history and social history were all reviewed and documented in the EPIC chart.  Directed ROS with pertinent positives and negatives documented in the history of present illness/assessment and plan.  Exam: Vitals:   09/18/17 1454  BP: 124/82   General appearance:  Normal  Ultrasound transvaginal shows uterus normal size and echotexture.  Endometrial echo 7.4 mm.  Try layered.  Right ovary normal.  Left ovary with corpus luteal appearing cyst 21 x 16 x 17 mm.  Negative color flow Doppler.  Cul-de-sac negative.  Assessment/Plan:  27 y.o. G1P0010 with worsening dysmenorrhea.  Was tried on Celebrex and she feels this is not helping.  Need to avoid chronic narcotics discussed with her.  Reviewed options for menstrual suppression as well as birth control.  She had used Depo-Provera in the past and did well with no menses.  Also have discussed Mirena IUD with her and encouraged her to consider this.  At this point do not feel birth control pills wise until we get her glucoses under control.  She would like to go ahead and restart Depo-Provera with her next menses.  We will plan to continue for the next year or 2.  Issues of accelerated bone loss discussed.  In the interim after starting Depo-Provera we may want to consider the Mirena.  Possible laparoscopy rule out endometriosis discussed.  Impression such as Depo-Lupron or Pollie FriarOrlissa so discussed.  She will follow-up with her next menses to initiate Depo-Provera to get control of her dysmenorrhea for now.    Dara Lordsimothy P Graci Hulce MD,  3:21 PM 09/18/2017

## 2017-09-30 ENCOUNTER — Telehealth: Payer: Self-pay | Admitting: *Deleted

## 2017-09-30 ENCOUNTER — Ambulatory Visit (INDEPENDENT_AMBULATORY_CARE_PROVIDER_SITE_OTHER): Payer: BLUE CROSS/BLUE SHIELD | Admitting: *Deleted

## 2017-09-30 DIAGNOSIS — Z3042 Encounter for surveillance of injectable contraceptive: Secondary | ICD-10-CM

## 2017-09-30 MED ORDER — MEDROXYPROGESTERONE ACETATE 150 MG/ML IM SUSP: 150.0000 mg | Freq: Once | INTRAMUSCULAR | 0 refills | Status: DC

## 2017-09-30 MED ORDER — MEDROXYPROGESTERONE ACETATE 150 MG/ML IM SUSP
150.0000 mg | Freq: Once | INTRAMUSCULAR | Status: AC
  Administered 2017-09-30: 150 mg via INTRAMUSCULAR

## 2017-09-30 NOTE — Telephone Encounter (Signed)
Patient called today, stating cycle started this am, coming today at 3pm for shot. Rx sent.

## 2017-09-30 NOTE — Telephone Encounter (Signed)
-----   Message from Dara Lordsimothy P Fontaine, MD sent at 09/18/2017  3:25 PM EST ----- Patient needs to arrange for Depo-Provera 150 mg with her next menses and to repeat every 3 months.

## 2017-09-30 NOTE — Progress Notes (Signed)
la 

## 2017-10-04 ENCOUNTER — Emergency Department (HOSPITAL_COMMUNITY)
Admission: EM | Admit: 2017-10-04 | Discharge: 2017-10-04 | Disposition: A | Discharge status: Home / Self Care | Payer: BLUE CROSS/BLUE SHIELD | Attending: Emergency Medicine | Admitting: Emergency Medicine

## 2017-10-04 ENCOUNTER — Encounter (HOSPITAL_COMMUNITY): Payer: Self-pay | Admitting: Emergency Medicine

## 2017-10-04 ENCOUNTER — Other Ambulatory Visit: Payer: Self-pay

## 2017-10-04 DIAGNOSIS — R109 Unspecified abdominal pain: Secondary | ICD-10-CM | POA: Diagnosis present

## 2017-10-04 DIAGNOSIS — Z5321 Procedure and treatment not carried out due to patient leaving prior to being seen by health care provider: Secondary | ICD-10-CM | POA: Diagnosis not present

## 2017-10-04 LAB — COMPREHENSIVE METABOLIC PANEL
ALT: 18 U/L (ref 14–54)
AST: 19 U/L (ref 15–41)
Albumin: 3.8 g/dL (ref 3.5–5.0)
Alkaline Phosphatase: 90 U/L (ref 38–126)
Anion gap: 11 (ref 5–15)
BILIRUBIN TOTAL: 0.6 mg/dL (ref 0.3–1.2)
BUN: 7 mg/dL (ref 6–20)
CHLORIDE: 100 mmol/L — AB (ref 101–111)
CO2: 23 mmol/L (ref 22–32)
Calcium: 9.2 mg/dL (ref 8.9–10.3)
Creatinine, Ser: 0.82 mg/dL (ref 0.44–1.00)
GFR calc Af Amer: >60 mL/min (ref >60)
GFR calc non Af Amer: >60 mL/min (ref >60)
Glucose, Bld: 339 mg/dL — ABNORMAL HIGH (ref 65–99)
POTASSIUM: 4 mmol/L (ref 3.5–5.1)
Sodium: 134 mmol/L — ABNORMAL LOW (ref 135–145)
Total Protein: 7.3 g/dL (ref 6.5–8.1)

## 2017-10-04 LAB — CBC WITH DIFFERENTIAL/PLATELET
BASOS ABS: 0 10*3/uL (ref 0.0–0.1)
BASOS PCT: 0 %
Eosinophils Absolute: 0.1 10*3/uL (ref 0.0–0.7)
Eosinophils Relative: 1 %
HEMATOCRIT: 37.4 % (ref 36.0–46.0)
Hemoglobin: 12.6 g/dL (ref 12.0–15.0)
Lymphocytes Relative: 40 %
Lymphs Abs: 2.5 10*3/uL (ref 0.7–4.0)
MCH: 29.9 pg (ref 26.0–34.0)
MCHC: 33.7 g/dL (ref 30.0–36.0)
MCV: 88.6 fL (ref 78.0–100.0)
MONO ABS: 0.4 10*3/uL (ref 0.1–1.0)
Monocytes Relative: 6 %
NEUTROS ABS: 3.3 10*3/uL (ref 1.7–7.7)
NEUTROS PCT: 53 %
PLATELETS: 357 10*3/uL (ref 150–400)
RBC: 4.22 MIL/uL (ref 3.87–5.11)
RDW: 13.1 % (ref 11.5–15.5)
WBC: 6.3 10*3/uL (ref 4.0–10.5)

## 2017-10-04 LAB — URINALYSIS, ROUTINE W REFLEX MICROSCOPIC
Bilirubin Urine: NEGATIVE
Glucose, UA: >=500 mg/dL — AB
Ketones, ur: NEGATIVE mg/dL
Leukocytes, UA: NEGATIVE
Nitrite: NEGATIVE
PROTEIN: NEGATIVE mg/dL
Specific Gravity, Urine: 1.026 (ref 1.005–1.030)
pH: 6 (ref 5.0–8.0)

## 2017-10-04 LAB — POC URINE PREG, ED: PREG TEST UR: NEGATIVE

## 2017-10-04 NOTE — ED Notes (Signed)
Prom, EMT came to get pt to take to treatment room and pt states she is not being seen.  Pt has been sitting in peds waiting area and is here with someone else.  Prom spoke with the patient about her room being ready and she declined to be seen.

## 2017-10-04 NOTE — ED Triage Notes (Signed)
Pt c/o lower abd pain radiating around to back x's 6 days ago.  Pt denies any nausea or vomiting.  Denies any urinary symptoms or vaginal discharge.  Pt st's she did work out last week and pain started the next day

## 2017-10-05 ENCOUNTER — Other Ambulatory Visit: Payer: Self-pay

## 2017-10-05 ENCOUNTER — Encounter (HOSPITAL_COMMUNITY): Payer: Self-pay | Admitting: Emergency Medicine

## 2017-10-05 ENCOUNTER — Ambulatory Visit (HOSPITAL_COMMUNITY)
Admission: EM | Admit: 2017-10-05 | Discharge: 2017-10-05 | Disposition: A | Discharge status: Home / Self Care | Payer: BLUE CROSS/BLUE SHIELD | Attending: Family Medicine | Admitting: Family Medicine

## 2017-10-05 DIAGNOSIS — R109 Unspecified abdominal pain: Secondary | ICD-10-CM

## 2017-10-05 DIAGNOSIS — E1165 Type 2 diabetes mellitus with hyperglycemia: Secondary | ICD-10-CM

## 2017-10-05 LAB — POCT URINALYSIS DIP (DEVICE)
BILIRUBIN URINE: NEGATIVE
Glucose, UA: 500 mg/dL — AB
Ketones, ur: NEGATIVE mg/dL
Leukocytes, UA: NEGATIVE
NITRITE: NEGATIVE
PH: 7 (ref 5.0–8.0)
Protein, ur: NEGATIVE mg/dL
Specific Gravity, Urine: 1.015 (ref 1.005–1.030)
UROBILINOGEN UA: 0.2 mg/dL (ref 0.0–1.0)

## 2017-10-05 LAB — POCT I-STAT, CHEM 8
BUN: 5 mg/dL — ABNORMAL LOW (ref 6–20)
CALCIUM ION: 1.25 mmol/L (ref 1.15–1.40)
CHLORIDE: 99 mmol/L — AB (ref 101–111)
Creatinine, Ser: 0.6 mg/dL (ref 0.44–1.00)
GLUCOSE: 319 mg/dL — AB (ref 65–99)
HCT: 44 % (ref 36.0–46.0)
Hemoglobin: 15 g/dL (ref 12.0–15.0)
Potassium: 4.1 mmol/L (ref 3.5–5.1)
Sodium: 135 mmol/L (ref 135–145)
TCO2: 25 mmol/L (ref 22–32)

## 2017-10-05 MED ORDER — TRAMADOL HCL 50 MG PO TABS: 50.0000 mg | ORAL_TABLET | Freq: Four times a day (QID) | ORAL | 0 refills | Status: DC | PRN

## 2017-10-05 NOTE — Discharge Instructions (Signed)
Please return here or to the Emergency Department immediately should you feel worse in any way or have any of the following symptoms: increasing or different abdominal pain, persistent vomiting, fevers, or shaking chills. Please follow for a recheck in 24 hours in order to ensure that you are not developing a problem that might require surgery or hospitalization.  Please get your diabetes medicines as soon as possible. Your blood sugar today was >300.

## 2017-10-05 NOTE — ED Triage Notes (Signed)
Pt states last week she was lifting weights and felt like she pulled a muscle in her stomach. Went to ER yesterday but LWBS.

## 2017-10-05 NOTE — ED Provider Notes (Signed)
[facility]   [id number] 10/05/17 Arrival Time: 1237  ASSESSMENT & PLAN:  1. Abdominal discomfort   2. Uncontrolled type 2 diabetes mellitus with hyperglycemia (HCC)    Suspect lower abdominal discomfort is related to menstrual cramping. May use OTC ibuprofen. Close observation. Abdominal pain precautions given. She plans f/u with her PCP this coming week. Glucose today 319.  May f/u here as needed.   Labs from ED visit on 10/04/2017 reviewed. Reviewed expectations re: course of current medical issues. Questions answered. Outlined signs and symptoms indicating need for more acute intervention. Patient verbalized understanding. After Visit Summary given.   SUBJECTIVE:  Misty [name] is a 27 y.o. female who presents with complaint of intermittent abdominal discomfort. Onset gradual, over the past week. Location: across lower abdomen without radiation. Described as cramping. Symptoms are unchanged since beginning. Aggravating factors: none. Alleviating factors: none. Associated symptoms: slight nausea. She denies arthralgias, belching, constipation, diarrhea, dysuria, fever, sweats and vomiting. Appetite: normal. PO intake: decreased. Ambulatory with assistance. Urinary symptoms: none. Last bowel movement yesterday without blood. OTC treatment: None. [facility].  Patient's last menstrual period was 09/30/2017 (exact date).   Past Surgical History:  Procedure Laterality Date  . NO PAST SURGERIES      ROS: As per HPI.  OBJECTIVE:  Vitals:   10/05/17 1350  BP: 135/76  Pulse: 62  Resp: 14  Temp: 98.7 F (37.1 C)  SpO2: 100%    General appearance: alert; no distress Lungs: clear to auscultation bilaterally Heart: regular rate and rhythm Abdomen: soft; non-distended; mild poorly-localized tenderness over lower abdomen; bowel sounds present; no masses or organomegaly; no guarding or rebound tenderness Back: no CVA  tenderness; FROM at hips Extremities: no edema; symmetrical with no gross deformities Skin: warm and dry Neurologic: normal gait Psychological: alert and cooperative; normal mood and affect  Labs: Results for orders placed or performed during the hospital encounter of 10/04/17  CBC with Differential  Result Value Ref Range   WBC 6.3 4.0 - 10.5 K/uL   RBC 4.22 3.87 - 5.11 MIL/uL   Hemoglobin 12.6 12.0 - 15.0 g/dL   HCT 78.237.4 95.636.0 - 21.346.0 %   MCV 88.6 78.0 - 100.0 fL   MCH 29.9 26.0 - 34.0 pg   MCHC 33.7 30.0 - 36.0 g/dL   RDW 08.613.1 57.811.5 - 46.915.5 %   Platelets 357 150 - 400 K/uL   Neutrophils Relative % 53 %   Neutro Abs 3.3 1.7 - 7.7 K/uL   Lymphocytes Relative 40 %   Lymphs Abs 2.5 0.7 - 4.0 K/uL   Monocytes Relative 6 %   Monocytes Absolute 0.4 0.1 - 1.0 K/uL   Eosinophils Relative 1 %   Eosinophils Absolute 0.1 0.0 - 0.7 K/uL   Basophils Relative 0 %   Basophils Absolute 0.0 0.0 - 0.1 K/uL  Comprehensive metabolic panel  Result Value Ref Range   Sodium 134 (L) 135 - 145 mmol/L   Potassium 4.0 3.5 - 5.1 mmol/L   Chloride 100 (L) 101 - 111 mmol/L   CO2 23 22 - 32 mmol/L   Glucose, Bld 339 (H) 65 - 99 mg/dL   BUN 7 6 - 20 mg/dL   Creatinine, Ser 6.290.82 0.44 - 1.00 mg/dL   Calcium 9.2 8.9 - 52.810.3 mg/dL   Total Protein 7.3 6.5 - 8.1 g/dL   Albumin 3.8 3.5 - 5.0 g/dL   AST 19 15 - 41 U/L   ALT  18 14 - 54 U/L   Alkaline Phosphatase 90 38 - 126 U/L   Total Bilirubin 0.6 0.3 - 1.2 mg/dL   GFR calc non Af Amer >60 >60 mL/min   GFR calc Af Amer >60 >60 mL/min   Anion gap 11 5 - 15  Urinalysis, Routine w reflex microscopic  Result Value Ref Range   Color, Urine YELLOW YELLOW   APPearance HAZY (A) CLEAR   Specific Gravity, Urine 1.026 1.005 - 1.030   pH 6.0 5.0 - 8.0   Glucose, UA >=500 (A) NEGATIVE mg/dL   Hgb urine dipstick LARGE (A) NEGATIVE   Bilirubin Urine NEGATIVE NEGATIVE   Ketones, ur NEGATIVE NEGATIVE mg/dL   Protein, ur NEGATIVE NEGATIVE mg/dL   Nitrite NEGATIVE  NEGATIVE   Leukocytes, UA NEGATIVE NEGATIVE   RBC / HPF 0-5 0 - 5 RBC/hpf   WBC, UA 0-5 0 - 5 WBC/hpf   Bacteria, UA RARE (A) NONE SEEN   Squamous Epithelial / LPF 6-30 (A) NONE SEEN   Mucus PRESENT   POC Urine Pregnancy, ED (do NOT order at [facility])  Result Value Ref Range   Preg Test, Ur NEGATIVE NEGATIVE    Allergies  Allergen Reactions  . Iodides Anaphylaxis and Itching  . Morphine And Related Anaphylaxis and Itching  . Shellfish Allergy Anaphylaxis and Itching    Pt reports "watery eyes"  . Ultram [Tramadol Hcl] Hives    Hives and swollen lips                                                Past Medical History:  Diagnosis Date  . Allergy   . Anemia 2015  . Asthma    as child  . Diabetes mellitus without complication (HCC) 2015  . Eczema   . Hypertension 09/2013   Social History   Socioeconomic History  . Marital status: Single    Spouse name: Not on file  . Number of children: Not on file  . Years of education: Not on file  . Highest education level: Not on file  Social Needs  . Financial resource strain: Not on file  . Food insecurity - worry: Not on file  . Food insecurity - inability: Not on file  . Transportation needs - medical: Not on file  . Transportation needs - non-medical: Not on file  Occupational History  . Not on file  Tobacco Use  . Smoking status: Former Smoker    Packs/day: 0.50    Years: 2.00    Pack years: 1.00    Last attempt to quit: 09/08/2013    Years since quitting: 4.0  . Smokeless tobacco: Never Used  Substance and Sexual Activity  . Alcohol use: No    Alcohol/week: 0.0 oz  . Drug use: No  . Sexual activity: Yes    Birth control/protection: Condom, , Pill    Comment: 1st intercourse 73 yo--1 partner ( has been same partner 3 yrs)   Other Topics Concern  . Not on file  Social History Narrative   Works at [employer] History  Problem Relation Age of Onset  . Hypertension Mother   . Heart disease Mother 18       CHF  .  Diabetes Mother   . Sickle cell anemia Brother   . Hypertension Maternal Grandfather   . Diabetes Maternal  Grandfather   . Heart disease Maternal Grandfather   . Diabetes Maternal Grandmother   . Heart disease Maternal Dulce Sellar, MD 10/07/17 1410

## 2017-10-07 ENCOUNTER — Ambulatory Visit: Payer: BLUE CROSS/BLUE SHIELD

## 2017-10-19 ENCOUNTER — Encounter (HOSPITAL_COMMUNITY): Payer: Self-pay | Admitting: Emergency Medicine

## 2017-10-19 ENCOUNTER — Ambulatory Visit (HOSPITAL_COMMUNITY)
Admission: EM | Admit: 2017-10-19 | Discharge: 2017-10-19 | Disposition: A | Discharge status: Home / Self Care | Payer: BLUE CROSS/BLUE SHIELD | Attending: Family Medicine | Admitting: Family Medicine

## 2017-10-19 DIAGNOSIS — M25551 Pain in right hip: Secondary | ICD-10-CM | POA: Diagnosis not present

## 2017-10-19 DIAGNOSIS — W19XXXA Unspecified fall, initial encounter: Secondary | ICD-10-CM | POA: Diagnosis not present

## 2017-10-19 MED ORDER — KETOROLAC TROMETHAMINE 30 MG/ML IJ SOLN
INTRAMUSCULAR | Status: AC
  Filled 2017-10-19: qty 1

## 2017-10-19 MED ORDER — MELOXICAM 7.5 MG PO TABS: 7.5000 mg | ORAL_TABLET | Freq: Every day | ORAL | 0 refills | Status: DC

## 2017-10-19 MED ORDER — KETOROLAC TROMETHAMINE 30 MG/ML IJ SOLN
30.0000 mg | Freq: Once | INTRAMUSCULAR | Status: AC
  Administered 2017-10-19: 30 mg via INTRAMUSCULAR

## 2017-10-19 NOTE — Discharge Instructions (Signed)
Your exam was most consistent with muscle strain. Start Mobic as directed. Ice/heat compresses as needed. This can take up to 3-4 weeks to completely resolve, but you should be feeling better each week. Follow up with PCP/orthopedics if symptoms worsen, changes for reevaluation. If experience numbness/tingling of the inner thighs, loss of bladder or bowel control, go to the emergency department for evaluation.

## 2017-10-19 NOTE — ED Provider Notes (Signed)
[place] [facility]    CSN: [id number] Arrival date & time: 10/19/17  1526     History   Chief Complaint Chief Complaint  Patient presents with  . Flank Pain    HPI Misty [name] is a 27 y.o. female.   27 year old female comes in for right flank/hip pain this morning.  States she woke up with the pain.  States pain is gradually getting worse.  Does endorse falling yesterday, states she slipped in the tub.  She was able to move her hips without problems yesterday, and now states painful movement.  She is able to walk without problems.  Denies fever, chills, night sweats.  Denies urinary symptoms such as frequency, dysuria, hematuria.  Denies nausea, vomiting, diarrhea.  Denies numbness, tingling, loss of bladder or bowel control.  She has not tried anything for the symptoms.      Past Medical History:  Diagnosis Date  . Allergy   . Anemia 2015  . Asthma    as child  . Diabetes mellitus without complication ([place]) 5188  . Eczema   . Hypertension 09/2013    There are no active problems to display for this patient.   Past Surgical History:  Procedure Laterality Date  . NO PAST SURGERIES      OB History    Gravida Para Term Preterm AB Living   1 0 0 0 1 0   SAB TAB Ectopic Multiple Live Births   1 0 0 0         Home Medications    Prior to Admission medications   Medication Sig Start Date End Date Taking? Authorizing Provider  amLODipine (NORVASC) 5 MG tablet Take 5 mg by mouth daily.   Yes [provider]  blood glucose meter kit and supplies KIT Dispense based on patient and insurance preference. Use up to four times daily as directed. (FOR ICD-9 250.00, 250.01). 01/10/16  Yes Hedges, Dellis Filbert, PA-C  celecoxib (CELEBREX) 200 MG capsule Take 2 tablets by mouth at onset of pain and then 1 tablet every 12 hours as needed afterwards for pain 09/06/17  Yes Fontaine, Belinda Block, MD  medroxyPROGESTERone (DEPO-PROVERA) 150 MG/ML injection Inject 150 mg into the  muscle every 3 (three) months. Last injection 09/30/17   Yes [provider]  metFORMIN (GLUCOPHAGE) 500 MG tablet Take 1,000 mg by mouth daily with breakfast.    Yes [provider]  norethindrone-ethinyl estradiol (MICROGESTIN,JUNEL,LOESTRIN) 1-20 MG-MCG tablet Take 1 tablet by mouth daily. 09/02/17  Yes Fontaine, Belinda Block, MD  vitamin C (ASCORBIC ACID) 500 MG tablet Take 500 mg by mouth daily.   Yes [provider]  meloxicam (MOBIC) 7.5 MG tablet Take 1 tablet (7.5 mg total) by mouth daily. 10/19/17   Tasia Catchings, Amy V, PA-C  traMADol (ULTRAM) 50 MG tablet Take 1 tablet (50 mg total) by mouth every 6 (six) hours as needed. 10/05/17   Vanessa Kick, MD    Family History Family History  Problem Relation Age of Onset  . Hypertension Mother   . Heart disease Mother 22       CHF  . Diabetes Mother   . Sickle cell anemia Brother   . Hypertension Maternal Grandfather   . Diabetes Maternal Grandfather   . Heart disease Maternal Grandfather   . Diabetes Maternal Grandmother   . Heart disease Maternal Grandmother     Social History Social History   Tobacco Use  . Smoking status: Former Smoker    Packs/day: 0.50  Years: 2.00    Pack years: 1.00    Last attempt to quit: 09/08/2013    Years since quitting: 4.1  . Smokeless tobacco: Never Used  Substance Use Topics  . Alcohol use: No    Alcohol/week: 0.0 oz  . Drug use: No     Allergies   Iodides; Morphine and related; Shellfish allergy; and Ultram [tramadol hcl]   Review of Systems Review of Systems  Reason unable to perform ROS: See HPI as above.     Physical Exam Triage Vital Signs ED Triage Vitals  Enc Vitals Group     BP 10/19/17 1604 127/79     Pulse Rate 10/19/17 1604 63     Resp 10/19/17 1604 18     Temp 10/19/17 1604 98.3 F (36.8 C)     Temp Source 10/19/17 1604 Oral     SpO2 10/19/17 1604 100 %     Weight --      Height --      Head Circumference --      Peak Flow --      Pain Score  10/19/17 1605 9     Pain Loc --      Pain Edu? --      Excl. in [place]? --    No data found.  Updated Vital Signs BP 127/79 (BP Location: Left Arm)   Pulse 63   Temp 98.3 F (36.8 C) (Oral)   Resp 18   LMP 09/30/2017 (Exact Date)   SpO2 100%   Physical Exam  Constitutional: She is oriented to person, place, and time. She appears well-developed and well-nourished. No distress.  HENT:  Head: Normocephalic and atraumatic.  Eyes: Conjunctivae are normal. Pupils are equal, round, and reactive to light.  Cardiovascular: Normal rate, regular rhythm and normal heart sounds. Exam reveals no gallop and no friction rub.  No murmur heard. Pulmonary/Chest: Effort normal and breath sounds normal. She has no wheezes. She has no rales.  Musculoskeletal:  No tenderness on palpation of the spinous processes.  Tenderness on palpation of right lower lumbar region/gluteal region.  Tenderness on palpation of right lateral thigh.  Decreased external rotation. Strength normal and equal bilaterally. Sensation intact and equal bilaterally.  Negative straight leg raise.  Patient observed walking down the hall without abnormal gait/coordination.  Neurological: She is alert and oriented to person, place, and time.  Skin: Skin is warm and dry.   UC Treatments / Results  Labs (all labs ordered are listed, but only abnormal results are displayed) Labs Reviewed - No data to display  EKG  EKG Interpretation None       Radiology No results found.  Procedures Procedures (including critical care time)  Medications Ordered in UC Medications  ketorolac (TORADOL) 30 MG/ML injection 30 mg (30 mg Intramuscular Given 10/19/17 1643)     Initial Impression / Assessment and Plan / UC Course  I have reviewed the triage vital signs and the nursing notes.  Pertinent labs & imaging results that were available during my care of the patient were reviewed by me and considered in my medical decision making (see chart  for details).    Offered hip xray given decreased external rotation. Patient declined xray. Patient requesting oxycodone for symptoms. Discussed with patient will need medication to reduce inflammation, and oxycodone is not indicated. Patient then requested tramadol. Provided same discussion. Offered toradol injection with mobic for pain. Ice/heat compresses. Discussed with patient strain can take up to 3-4 weeks to resolve,  but should be getting better each week. Return precautions given. Patient expresses understanding and agrees to plan.    Final Clinical Impressions(s) / UC Diagnoses   Final diagnoses:  Right hip pain    ED Discharge Orders        Ordered    meloxicam (MOBIC) 7.5 MG tablet  Daily     10/19/17 1635        Ok Edwards, [place] 10/19/17 1648

## 2017-10-19 NOTE — ED Triage Notes (Signed)
PT C/O: right flank pain onset this am when she woke up  Reports pain is getting "worse"  DENIES: fevers, v/n/d  TAKING MEDS: none   A&O x4... NAD... Ambulatory

## 2017-11-02 ENCOUNTER — Other Ambulatory Visit: Payer: Self-pay

## 2017-11-02 ENCOUNTER — Ambulatory Visit (HOSPITAL_COMMUNITY)
Admission: EM | Admit: 2017-11-02 | Discharge: 2017-11-02 | Disposition: A | Discharge status: Home / Self Care | Payer: BLUE CROSS/BLUE SHIELD | Attending: Family Medicine | Admitting: Family Medicine

## 2017-11-02 ENCOUNTER — Encounter (HOSPITAL_COMMUNITY): Payer: Self-pay | Admitting: Emergency Medicine

## 2017-11-02 DIAGNOSIS — J111 Influenza due to unidentified influenza virus with other respiratory manifestations: Secondary | ICD-10-CM

## 2017-11-02 DIAGNOSIS — R69 Illness, unspecified: Secondary | ICD-10-CM | POA: Diagnosis not present

## 2017-11-02 MED ORDER — BENZONATATE 100 MG PO CAPS: 100.0000 mg | ORAL_CAPSULE | Freq: Three times a day (TID) | ORAL | 0 refills | Status: DC

## 2017-11-02 MED ORDER — OSELTAMIVIR PHOSPHATE 75 MG PO CAPS: 75.0000 mg | ORAL_CAPSULE | Freq: Two times a day (BID) | ORAL | 0 refills | Status: AC

## 2017-11-02 NOTE — Discharge Instructions (Signed)

## 2017-11-02 NOTE — ED Triage Notes (Signed)
Patient started feeling bad yesterday.  Symptoms include productive cough, stuffy nose, sore throat.  Poor appetite

## 2017-11-07 NOTE — ED Provider Notes (Signed)
[facility]   [id number] 11/02/17 Arrival Time: 1936  ASSESSMENT & PLAN:  1. Influenza-like illness     Meds ordered this encounter  Medications  . oseltamivir (TAMIFLU) 75 MG capsule    Sig: Take 1 capsule (75 mg total) by mouth 2 (two) times daily for 5 days.    Dispense:  10 capsule    Refill:  0  . benzonatate (TESSALON) 100 MG capsule    Sig: Take 1 capsule (100 mg total) by mouth every 8 (eight) hours.    Dispense:  21 capsule    Refill:  0   Discussed typical duration of symptoms. OTC symptom care as needed. Ensure adequate fluid intake and rest. May f/u with PCP or here as needed.  Reviewed expectations re: course of current medical issues. Questions answered. Outlined signs and symptoms indicating need for more acute intervention. Patient verbalized understanding. After Visit Summary given.   SUBJECTIVE: History from: patient.  Misty [name] is a 27 y.o. female who presents with complaint of nasal congestion, post-nasal drainage, and a persistent dry cough. Onset abrupt, approximately 1 day ago. Overall fatigued with body aches. SOB: none. Wheezing: none. Fever: yes, subjective. Overall normal PO intake without n/v. Sick contacts: no. OTC treatment: Tylenol with mild help.  Social History   Tobacco Use  Smoking Status Former Smoker  . Packs/day: 0.50  . Years: 2.00  . Pack years: 1.00  . Last attempt to quit: 09/08/2013  . Years since quitting: 4.1  Smokeless Tobacco Never Used    ROS: As per HPI.   OBJECTIVE:  Vitals:   11/02/17 2011  BP: (!) 150/102  Pulse: (!) 117  Resp: (!) 22  Temp: 100.3 F (37.9 C)  TempSrc: Oral  SpO2: 100%    General appearance: alert; appears fatigued HEENT: nasal congestion; clear runny nose; throat irritation secondary to post-nasal drainage Neck: supple without LAD CV: tachycardic; regular Lungs: unlabored respirations, symmetrical air entry; cough: mild; no respiratory distress (recheck RR  18) Skin: warm and dry Psychological: alert and cooperative; normal mood and affect   Allergies  Allergen Reactions  . Iodides Anaphylaxis and Itching  . Morphine And Related Anaphylaxis and Itching  . Shellfish Allergy Anaphylaxis and Itching    Pt reports "watery eyes"  . Ultram [Tramadol Hcl] Hives    Hives and swollen lips     Past Medical History:  Diagnosis Date  . Allergy   . Anemia 2015  . Asthma    as child  . Diabetes mellitus without complication (HCC) 2015  . Eczema   . Hypertension 09/2013   Family History  Problem Relation Age of Onset  . Hypertension Mother   . Heart disease Mother [id number]       CHF  . Diabetes Mother   . Sickle cell anemia Brother   . Hypertension Maternal Grandfather   . Diabetes Maternal Grandfather   . Heart disease Maternal Grandfather   . Diabetes Maternal Grandmother   . Heart disease Maternal Grandmother    Social History   Socioeconomic History  . Marital status: Single    Spouse name: Not on file  . Number of children: Not on file  . Years of education: Not on file  . Highest education level: Not on file  Social Needs  . Financial resource strain: Not on file  . Food insecurity - worry: Not on file  . Food insecurity - inability: Not on file  . Transportation needs - medical: Not on file  .  Transportation needs - non-medical: Not on file  Occupational History  . Not on file  Tobacco Use  . Smoking status: Former Smoker    Packs/day: 0.50    Years: 2.00    Pack years: 1.00    Last attempt to quit: 09/08/2013    Years since quitting: 4.1  . Smokeless tobacco: Never Used  Substance and Sexual Activity  . Alcohol use: No    Alcohol/week: 0.0 oz  . Drug use: No  . Sexual activity: Yes    Birth control/protection: Condom, , Pill    Comment: 1st intercourse 35 yo--1 partner ( has been same partner 3 yrs)   Other Topics Concern  . Not on file  Social History Narrative   Works at [employer], Arlys John,  MD 11/07/17 [phone]

## 2017-12-02 ENCOUNTER — Other Ambulatory Visit: Payer: Self-pay | Admitting: Gynecology

## 2017-12-09 ENCOUNTER — Encounter: Payer: BLUE CROSS/BLUE SHIELD | Admitting: Gynecology

## 2017-12-10 ENCOUNTER — Encounter: Payer: BLUE CROSS/BLUE SHIELD | Admitting: Gynecology

## 2017-12-13 ENCOUNTER — Telehealth: Payer: Self-pay | Admitting: *Deleted

## 2017-12-13 NOTE — Telephone Encounter (Signed)
Patient had her 1st depo shot on 09/30/17 states she would like to switch to birth control patch, states before when she was on depo she didn't have a cycle, but now she has bleeding or spotting. Currently bleeding now, not heavy. Please advise

## 2017-12-13 NOTE — Telephone Encounter (Signed)
Sometimes it takes 2-3 Depo-Provera shots to override the system.  She may become amenorrheic after the second shot if she wants to try this.  Otherwise I believe she was recently scheduled to see me for her annual but missed the appointment.  I did recommend appointment first i.e. annual and we can discuss birth control options.

## 2017-12-13 NOTE — Telephone Encounter (Signed)
Pt informed

## 2018-01-27 ENCOUNTER — Encounter (HOSPITAL_COMMUNITY): Payer: Self-pay | Admitting: Family Medicine

## 2018-01-27 ENCOUNTER — Ambulatory Visit (HOSPITAL_COMMUNITY)
Admission: EM | Admit: 2018-01-27 | Discharge: 2018-01-27 | Disposition: A | Discharge status: Home / Self Care | Payer: BLUE CROSS/BLUE SHIELD | Attending: Family Medicine | Admitting: Family Medicine

## 2018-01-27 DIAGNOSIS — L02219 Cutaneous abscess of trunk, unspecified: Secondary | ICD-10-CM

## 2018-01-27 DIAGNOSIS — L03319 Cellulitis of trunk, unspecified: Secondary | ICD-10-CM

## 2018-01-27 MED ORDER — DOXYCYCLINE HYCLATE 100 MG PO CAPS: 100.0000 mg | ORAL_CAPSULE | Freq: Two times a day (BID) | ORAL | 0 refills | Status: DC

## 2018-01-27 MED ORDER — IBUPROFEN 600 MG PO TABS: 600.0000 mg | ORAL_TABLET | Freq: Four times a day (QID) | ORAL | 0 refills | Status: DC | PRN

## 2018-01-27 NOTE — Discharge Instructions (Signed)
Take doxycyline twice daily for 7 days and return if your pain worsens despite this course of antibiotics or if you develop a fever.   Apply warm compresses to the area, you can keep it covered with a bandaid.   Take ibuprofen as needed for pain. You have an allergy to morphine and tramadol.

## 2018-01-27 NOTE — ED Triage Notes (Signed)
Pt here for abscess to buttocks area that is hard and draining x 2 days. Hx if recurrent infections.

## 2018-01-27 NOTE — ED Provider Notes (Addendum)
[place]    CSN: [id number] Arrival date & time: 01/27/18  1001     History   Chief Complaint Chief Complaint  Patient presents with  . Abscess    HPI Misty [name] is a 27 y.o. female.   HPI  Misty [name] is a 27 y.o. female with a history of T2DM and HTN who presents with a few days of worsening tenderness localized to a boil on the left upper back. This ruptured spontaneously last night, draining purulent fluid and scant blood which helped with pain which is moderate, constant, worse with pressure. No compresses applied and no medications tried. No recent fever. Had a similar infection clear up on its own a week ago. Otherwise no recurrent infections, or recently elevated blood sugars.    Past Medical History:  Diagnosis Date  . Allergy   . Anemia 2015  . Asthma    as child  . Diabetes mellitus without complication ([place]) 0093  . Eczema   . Hypertension 09/2013    There are no active problems to display for this patient.   Past Surgical History:  Procedure Laterality Date  . NO PAST SURGERIES      OB History    Gravida  1   Para  0   Term  0   Preterm  0   AB  1   Living  0     SAB  1   TAB  0   Ectopic  0   Multiple  0   Live Births               Home Medications    Prior to Admission medications   Medication Sig Start Date End Date Taking? Authorizing Provider  amLODipine (NORVASC) 5 MG tablet Take 5 mg by mouth daily.    [provider]  blood glucose meter kit and supplies KIT Dispense based on patient and insurance preference. Use up to four times daily as directed. (FOR ICD-9 250.00, 250.01). 01/10/16   Hedges, Dellis Filbert, PA-C  doxycycline (VIBRAMYCIN) 100 MG capsule Take 1 capsule (100 mg total) by mouth 2 (two) times daily. 01/27/18   Patrecia Pour, MD  ibuprofen (ADVIL,MOTRIN) 600 MG tablet Take 1 tablet (600 mg total) by mouth every 6 (six) hours as needed. 01/27/18   Patrecia Pour, MD    medroxyPROGESTERone (DEPO-PROVERA) 150 MG/ML injection Inject 150 mg into the muscle every 3 (three) months. Last injection 09/30/17    [provider]  medroxyPROGESTERone Acetate 150 MG/ML SUSY inject 31m into the muscle once for1 dose 12/02/17   Fontaine, TBelinda Block MD  meloxicam (MOBIC) 7.5 MG tablet Take 1 tablet (7.5 mg total) by mouth daily. 10/19/17   YTasia Catchings Amy V, PA-C  metFORMIN (GLUCOPHAGE) 500 MG tablet Take 1,000 mg by mouth daily with breakfast.     [provider]  vitamin C (ASCORBIC ACID) 500 MG tablet Take 500 mg by mouth daily.    [provider]    Family History Family History  Problem Relation Age of Onset  . Hypertension Mother   . Heart disease Mother 474      CHF  . Diabetes Mother   . Sickle cell anemia Brother   . Hypertension Maternal Grandfather   . Diabetes Maternal Grandfather   . Heart disease Maternal Grandfather   . Diabetes Maternal Grandmother   . Heart disease Maternal Grandmother     Social History Social History   Tobacco  Use  . Smoking status: Former Smoker    Packs/day: 0.50    Years: 2.00    Pack years: 1.00    Last attempt to quit: 09/08/2013    Years since quitting: 4.3  . Smokeless tobacco: Never Used  Substance Use Topics  . Alcohol use: No    Alcohol/week: 0.0 oz  . Drug use: No     Allergies   Iodides; Morphine and related; Shellfish allergy; and Ultram [tramadol hcl]   Review of Systems Review of Systems   Physical Exam Triage Vital Signs ED Triage Vitals  Enc Vitals Group     BP 01/27/18 1033 (!) 153/90     Pulse Rate 01/27/18 1033 71     Resp 01/27/18 1033 18     Temp 01/27/18 1033 98 F (36.7 C)     Temp src --      SpO2 01/27/18 1033 100 %     Weight --      Height --      Head Circumference --      Peak Flow --      Pain Score 01/27/18 1031 8     Pain Loc --      Pain Edu? --      Excl. in [place]? --    No data found.  Updated Vital Signs BP (!) 153/90   Pulse 71   Temp 98 F  (36.7 C)   Resp 18   SpO2 100%   Visual Acuity Right Eye Distance:   Left Eye Distance:   Bilateral Distance:    Right Eye Near:   Left Eye Near:    Bilateral Near:     Physical Exam Left upper back overlaying latissimus iw a 3in oblong induration without fluctuance with central punctum not actively draining. Minimal erythema. No regional lymphadenopathy.   UC Treatments / Results  Labs (all labs ordered are listed, but only abnormal results are displayed) Labs Reviewed - No data to display  EKG None  Radiology No results found.  Procedures Procedures (including critical care time)  Medications Ordered in UC Medications - No data to display  Initial Impression / Assessment and Plan / UC Course  I have reviewed the triage vital signs and the nursing notes.  Pertinent labs & imaging results that were available during my care of the patient were reviewed by me and considered in my medical decision making (see chart for details).     27yo F with boil s/p spontaneous rupture with a head evident where it has drained. Not currently draining but no fluctuance on exam. Will treat for purulent cellulitis/abscess but doubt I&D would be high yield at this time. Tylenol or ibuprofen pron mild-mod pain, will give short course of tramadol prn severe pain. Keep area covered, warm compresses. Return to clinic if fever or worsening pain despite abx. Will need good blood glucose control to help with healing (last HbA1c was 9.5%).   Final Clinical Impressions(s) / UC Diagnoses   Final diagnoses:  Cellulitis and abscess of trunk     Discharge Instructions     Take doxycyline twice daily for 7 days and return if your pain worsens despite this course of antibiotics or if you develop a fever.   Apply warm compresses to the area, you can keep it covered with a bandaid.   Take ibuprofen as needed for pain. You have an allergy to morphine and tramadol.     ED Prescriptions     Medication Sig  Dispense Auth. Provider   ibuprofen (ADVIL,MOTRIN) 600 MG tablet Take 1 tablet (600 mg total) by mouth every 6 (six) hours as needed. 30 tablet Patrecia Pour, MD   doxycycline (VIBRAMYCIN) 100 MG capsule Take 1 capsule (100 mg total) by mouth 2 (two) times daily. 14 capsule Patrecia Pour, MD       Patrecia Pour, MD 01/27/18 1107    Patrecia Pour, MD 01/27/18 1125

## 2018-01-28 ENCOUNTER — Other Ambulatory Visit: Payer: Self-pay

## 2018-01-28 ENCOUNTER — Encounter (HOSPITAL_COMMUNITY): Payer: Self-pay | Admitting: *Deleted

## 2018-01-28 ENCOUNTER — Emergency Department (HOSPITAL_COMMUNITY)
Admission: EM | Admit: 2018-01-28 | Discharge: 2018-01-28 | Disposition: A | Discharge status: Home / Self Care | Payer: Self-pay | Attending: Emergency Medicine | Admitting: Emergency Medicine

## 2018-01-28 DIAGNOSIS — Z79899 Other long term (current) drug therapy: Secondary | ICD-10-CM | POA: Insufficient documentation

## 2018-01-28 DIAGNOSIS — E1165 Type 2 diabetes mellitus with hyperglycemia: Secondary | ICD-10-CM | POA: Insufficient documentation

## 2018-01-28 DIAGNOSIS — L02212 Cutaneous abscess of back [any part, except buttock]: Secondary | ICD-10-CM | POA: Insufficient documentation

## 2018-01-28 DIAGNOSIS — J45909 Unspecified asthma, uncomplicated: Secondary | ICD-10-CM | POA: Insufficient documentation

## 2018-01-28 DIAGNOSIS — I10 Essential (primary) hypertension: Secondary | ICD-10-CM | POA: Insufficient documentation

## 2018-01-28 DIAGNOSIS — Z87891 Personal history of nicotine dependence: Secondary | ICD-10-CM | POA: Insufficient documentation

## 2018-01-28 DIAGNOSIS — R739 Hyperglycemia, unspecified: Secondary | ICD-10-CM

## 2018-01-28 LAB — CBG MONITORING, ED: Glucose-Capillary: 292 mg/dL — ABNORMAL HIGH (ref 65–99)

## 2018-01-28 MED ORDER — IBUPROFEN 800 MG PO TABS
800.0000 mg | ORAL_TABLET | Freq: Once | ORAL | Status: AC
  Administered 2018-01-28: 800 mg via ORAL
  Filled 2018-01-28: qty 1

## 2018-01-28 MED ORDER — SULFAMETHOXAZOLE-TRIMETHOPRIM 800-160 MG PO TABS: 1.0000 | ORAL_TABLET | Freq: Two times a day (BID) | ORAL | 0 refills | Status: AC

## 2018-01-28 MED ORDER — LIDOCAINE-EPINEPHRINE (PF) 2 %-1:200000 IJ SOLN
20.0000 mL | Freq: Once | INTRAMUSCULAR | Status: AC
  Administered 2018-01-28: 20 mL
  Filled 2018-01-28: qty 20

## 2018-01-28 MED ORDER — CEPHALEXIN 250 MG PO CAPS
500.0000 mg | ORAL_CAPSULE | Freq: Once | ORAL | Status: AC
  Administered 2018-01-28: 500 mg via ORAL
  Filled 2018-01-28: qty 2

## 2018-01-28 NOTE — ED Notes (Signed)
ED Provider at bedside for I&D.   

## 2018-01-28 NOTE — ED Notes (Signed)
Pt verbalized understanding of discharge instructions and denies any further questions at this time.   

## 2018-01-28 NOTE — Discharge Instructions (Addendum)
Thank you for allowing me to care of you today in the emergency department.  It is extremely important that you take the antibiotics at home so that your wound heals.  Your first dose of antibiotics was given in the emergency department.  Starting tonight, take 1 tablet of Bactrim every 12 hours.  After you get paid on Thursday, you can fill the second half of the prescription.  I have attached a good Rx coupon that can be used at [employer].  You can also call your regular pharmacy to see what the cost would be for the 4 tablets on the first prescription.  It is important that you take Bactrim for a week.  You can dispose of the prescription of doxycycline that was given to urgent care.  This way, you are taking the same antibiotic for the entire week.  Please continue to check your blood sugar at home.  Maintaining good control of your blood sugar will help the wound to heal better.  For pain, you can apply ice for 15 to 20 minutes up to 3-4 times a day.  Take 600 mg of ibuprofen with food or 650 mg of Tylenol once every 6 hours for pain control.  Keep the wound clean and dry for the next 24 hours.  After 24 hours, you can clean the area with warm soap and water.  One way to do this is in the shower.  After the shower, pat the area dry and apply a topical antibiotic such as bacitracin or Neosporin.  Then apply a clean, dry dressing as you have been instructed in the emergency department.  Make sure to change the dressing at least once daily or anytime it gets soiled.  It is normal for your wound to continue to drain for the next few days.  Please follow-up with your primary care provider or if they are unable to see you return to the emergency department for a wound recheck in 2 days, Thursday.  There is packing in the wound and it needs to be removed at that time.  Return to the emergency department sooner if you develop high fever or chills, significantly worsening redness, swelling, or pain around  the wound after you have taken at least 2-3 doses of antibiotics, or other new concerning symptoms.

## 2018-01-28 NOTE — ED Notes (Signed)
ED Provider at bedside. 

## 2018-01-28 NOTE — ED Notes (Signed)
Pt reports being seen by UC and placed on antibiotic but she is unable to get medication until Thursday.

## 2018-01-28 NOTE — ED Triage Notes (Signed)
Pt in c/o abcess to L mid back onset x 3 days, seen at [facility] yesteday and given ABX RX that she cannot get until Thursday, A&O x4

## 2018-01-28 NOTE — ED Provider Notes (Signed)
[place] EMERGENCY DEPARTMENT Provider Note   CSN: [id number] Arrival date & time: 01/28/18  3546     History   Chief Complaint Chief Complaint  Patient presents with  . Abscess    HPI Misty [name] is a 27 y.o. female with a history of poorly controlled diabetes mellitus type 2 and hypertension who presents to the emergency department with a chief complaint of abscess.  The patient endorses a constant, worsening abscess to the left middle of her back that began 3 days ago.  She was seen at urgent care yesterday and was discharged with a prescription of doxycycline and a short course of tramadol, but the area was not I&Ded because a small area of the wound is actively draining purulent material.  She states that she is unable to get the antibiotic filled until she gets pain in 2 days.  No other treatment prior to arrival.  She reports the pain and swelling to the area has continued to worsen.  She denies fever or chills.  She reports that her blood sugar runs between 200-300 at home.  The history is provided by the patient. No language interpreter was used.    Past Medical History:  Diagnosis Date  . Allergy   . Anemia 2015  . Asthma    as child  . Diabetes mellitus without complication ([place]) 5681  . Eczema   . Hypertension 09/2013    There are no active problems to display for this patient.   Past Surgical History:  Procedure Laterality Date  . NO PAST SURGERIES       OB History    Gravida  1   Para  0   Term  0   Preterm  0   AB  1   Living  0     SAB  1   TAB  0   Ectopic  0   Multiple  0   Live Births               Home Medications    Prior to Admission medications   Medication Sig Start Date End Date Taking? Authorizing Provider  amLODipine (NORVASC) 5 MG tablet Take 5 mg by mouth daily.    [provider]  blood glucose meter kit and supplies KIT Dispense based on patient and insurance preference.  Use up to four times daily as directed. (FOR ICD-9 250.00, 250.01). 01/10/16   Hedges, Dellis Filbert, PA-C  doxycycline (VIBRAMYCIN) 100 MG capsule Take 1 capsule (100 mg total) by mouth 2 (two) times daily. 01/27/18   Patrecia Pour, MD  ibuprofen (ADVIL,MOTRIN) 600 MG tablet Take 1 tablet (600 mg total) by mouth every 6 (six) hours as needed. 01/27/18   Patrecia Pour, MD  medroxyPROGESTERone (DEPO-PROVERA) 150 MG/ML injection Inject 150 mg into the muscle every 3 (three) months. Last injection 09/30/17    [provider]  medroxyPROGESTERone Acetate 150 MG/ML SUSY inject 64m into the muscle once for1 dose 12/02/17   Fontaine, TBelinda Block MD  meloxicam (MOBIC) 7.5 MG tablet Take 1 tablet (7.5 mg total) by mouth daily. 10/19/17   YTasia Catchings Amy V, PA-C  metFORMIN (GLUCOPHAGE) 500 MG tablet Take 1,000 mg by mouth daily with breakfast.     [provider]  sulfamethoxazole-trimethoprim (BACTRIM DS,SEPTRA DS) 800-160 MG tablet Take 1 tablet by mouth 2 (two) times daily for 4 doses. 01/28/18 01/30/18  Demarious Kapur A, PA-C  sulfamethoxazole-trimethoprim (BACTRIM DS,SEPTRA DS) 800-160 MG tablet Take  1 tablet by mouth 2 (two) times daily for 10 days. 01/28/18 02/07/18  Marteze Vecchio A, PA-C  vitamin C (ASCORBIC ACID) 500 MG tablet Take 500 mg by mouth daily.    [provider]    Family History Family History  Problem Relation Age of Onset  . Hypertension Mother   . Heart disease Mother 69       CHF  . Diabetes Mother   . Sickle cell anemia Brother   . Hypertension Maternal Grandfather   . Diabetes Maternal Grandfather   . Heart disease Maternal Grandfather   . Diabetes Maternal Grandmother   . Heart disease Maternal Grandmother     Social History Social History   Tobacco Use  . Smoking status: Former Smoker    Packs/day: 0.50    Years: 2.00    Pack years: 1.00    Last attempt to quit: 09/08/2013    Years since quitting: 4.3  . Smokeless tobacco: Never Used  Substance Use Topics  .  Alcohol use: No    Alcohol/week: 0.0 oz  . Drug use: No     Allergies   Iodides; Morphine and related; Shellfish allergy; and Ultram [tramadol hcl]   Review of Systems Review of Systems  Constitutional: Negative for activity change, chills and fever.  Respiratory: Negative for shortness of breath.   Cardiovascular: Negative for chest pain.  Gastrointestinal: Negative for abdominal pain.  Musculoskeletal: Negative for back pain.  Skin: Positive for color change and wound. Negative for rash.  Neurological: Negative for weakness and numbness.     Physical Exam Updated Vital Signs BP 127/88 (BP Location: Right Arm)   Pulse 77   Temp 98.8 F (37.1 C) (Oral)   Resp 18   SpO2 100%   Physical Exam  Constitutional: No distress.  HENT:  Head: Normocephalic.  Eyes: Conjunctivae are normal.  Neck: Neck supple.  Cardiovascular: Normal rate and regular rhythm. Exam reveals no gallop and no friction rub.  No murmur heard. Pulmonary/Chest: Effort normal. No respiratory distress.  Abdominal: Soft. She exhibits no distension.  Musculoskeletal: She exhibits edema and tenderness. She exhibits no deformity.  There is a 4 x 3 cm area of fluctuance, induration, erythema, and warmth to the left mid back.  There is a central area of the wound with a pinpoint opening that is actively draining a minimal amount of purulent material.  Neurological: She is alert.  Skin: Skin is warm. No rash noted.  Psychiatric: Her behavior is normal.  Nursing note and vitals reviewed.    ED Treatments / Results  Labs (all labs ordered are listed, but only abnormal results are displayed) Labs Reviewed  CBG MONITORING, ED - Abnormal; Notable for the following components:      Result Value   Glucose-Capillary 292 (*)    All other components within normal limits  AEROBIC CULTURE (SUPERFICIAL SPECIMEN)    EKG None  Radiology No results found.  Procedures  EMERGENCY DEPARTMENT US SOFT TISSUE  INTERPRETATION "Study: Limited Soft Tissue Ultrasound"  INDICATIONS: Soft tissue infection Multiple views of the body part were obtained in real-time with a multi-frequency linear probe  PERFORMED BY: Myself IMAGES ARCHIVED?: Yes SIDE:Left BODY PART:Upper back INTERPRETATION:  Abcess present  .Marland KitchenIncision and Drainage Date/Time: 01/28/2018 2:21 PM Performed by: Joanne Gavel, PA-C Authorized by: Joanne Gavel, PA-C   Consent:    Consent obtained:  Verbal   Consent given by:  Patient   Risks discussed:  Bleeding, incomplete drainage, pain, infection  and damage to other organs   Alternatives discussed:  No treatment and observation Location:    Type:  Abscess   Size:  4x3 cm   Location:  Trunk Pre-procedure details:    Skin preparation:  Betadine Anesthesia (see MAR for exact dosages):    Anesthesia method:  Local infiltration   Local anesthetic:  Lidocaine 2% WITH epi Procedure type:    Complexity:  Complex Procedure details:    Needle aspiration: no     Incision types:  Stab incision   Incision depth:  Subcutaneous   Scalpel blade:  10   Wound management:  Probed and deloculated, irrigated with saline and extensive cleaning   Drainage:  Bloody and purulent   Drainage amount:  Copious   Wound treatment:  Wound left open   Packing materials:  1/4 in iodoform gauze Post-procedure details:    Patient tolerance of procedure:  Tolerated well, no immediate complications   (including critical care time)  Medications Ordered in ED Medications  ibuprofen (ADVIL,MOTRIN) tablet 800 mg (800 mg Oral Given 01/28/18 1302)  lidocaine-EPINEPHrine (XYLOCAINE W/EPI) 2 %-1:200000 (PF) injection 20 mL (20 mLs Infiltration Given 01/28/18 1333)  cephALEXin (KEFLEX) capsule 500 mg (500 mg Oral Given 01/28/18 1333)     Initial Impression / Assessment and Plan / ED Course  I have reviewed the triage vital signs and the nursing notes.  Pertinent labs & imaging results that were available  during my care of the patient were reviewed by me and considered in my medical decision making (see chart for details).     27 year old female with a history of type 2 diabetes mellitus that is poorly controlled and hypertension presenting with an abscess for 3 days.  Bedside ultrasound with a large fluid collection that is amenable to I&D.  CBG to 292 in the ED.  On exam, there is a 4 x 3 cm area of induration, fluctuance, erythema, and warmth with a central pinpoint opening that is draining a minimal amount of purulent material.  Given the patient's hyperglycemia and concern for poor follow-up and outcomes, I&D performed in the ED.  Wound culture sent.  A copious amount of purulent and bloody discharge was expressed from the wound.  The wound was extensively irrigated after several rounds of the loculations was performed and packing was placed.  She has been instructed to have a wound recheck in 48 hours.  She was given 1 dose of Keflex in the ED; however, when assisting cost of the medication that she would be able to afford versus antibiotic coverage, I will discharge her home with 2 different prescriptions of Bactrim.  The first will be for 4 days to be more affordable until she gets paid because I am concerned about compliance.  The second will be for the remainder of the course.  She is agreeable with this plan at this time.   Final Clinical Impressions(s) / ED Diagnoses   Final diagnoses:  Abscess of upper back excluding scapular region  Hyperglycemia    ED Discharge Orders        Ordered    sulfamethoxazole-trimethoprim (BACTRIM DS,SEPTRA DS) 800-160 MG tablet  2 times daily     01/28/18 1403    sulfamethoxazole-trimethoprim (BACTRIM DS,SEPTRA DS) 800-160 MG tablet  2 times daily     01/28/18 1405       Mischele Detter A, PA-C 01/28/18 1508    Quintella Reichert, MD 01/29/18 [phone]

## 2018-01-28 NOTE — ED Notes (Signed)
CBG 292 mg/dL  Mauricio Po RN notified

## 2018-01-30 ENCOUNTER — Encounter (HOSPITAL_COMMUNITY): Payer: Self-pay | Admitting: Emergency Medicine

## 2018-01-30 ENCOUNTER — Emergency Department (HOSPITAL_COMMUNITY)
Admission: EM | Admit: 2018-01-30 | Discharge: 2018-01-30 | Disposition: A | Discharge status: Home / Self Care | Payer: Self-pay | Attending: Emergency Medicine | Admitting: Emergency Medicine

## 2018-01-30 ENCOUNTER — Ambulatory Visit (HOSPITAL_COMMUNITY)
Admission: EM | Admit: 2018-01-30 | Discharge: 2018-01-30 | Disposition: A | Discharge status: Home / Self Care | Payer: Self-pay | Attending: Family Medicine | Admitting: Family Medicine

## 2018-01-30 ENCOUNTER — Other Ambulatory Visit: Payer: Self-pay

## 2018-01-30 DIAGNOSIS — L02219 Cutaneous abscess of trunk, unspecified: Secondary | ICD-10-CM

## 2018-01-30 DIAGNOSIS — Z5321 Procedure and treatment not carried out due to patient leaving prior to being seen by health care provider: Secondary | ICD-10-CM | POA: Insufficient documentation

## 2018-01-30 DIAGNOSIS — L0291 Cutaneous abscess, unspecified: Secondary | ICD-10-CM

## 2018-01-30 DIAGNOSIS — Z4800 Encounter for change or removal of nonsurgical wound dressing: Secondary | ICD-10-CM

## 2018-01-30 DIAGNOSIS — Z5189 Encounter for other specified aftercare: Secondary | ICD-10-CM

## 2018-01-30 NOTE — ED Provider Notes (Signed)
[facility]    CSN: [id number] Arrival date & time: 01/30/18  1004     History   Chief Complaint Chief Complaint  Patient presents with  . Abscess  . Wound Check    HPI Misty [name] is a 27 y.o. female.   Patient is here for a wound check and removal of packing.  She had incision and drainage of an abscess on the left side of her back over the scapula 2 days ago.  There has been some small drainage since.  She was instructed to come back today for recheck and packing removal.  HPI  Past Medical History:  Diagnosis Date  . Allergy   . Anemia 2015  . Asthma    as child  . Diabetes mellitus without complication ([place]) 4403  . Eczema   . Hypertension 09/2013    There are no active problems to display for this patient.   Past Surgical History:  Procedure Laterality Date  . NO PAST SURGERIES      OB History    Gravida  1   Para  0   Term  0   Preterm  0   AB  1   Living  0     SAB  1   TAB  0   Ectopic  0   Multiple  0   Live Births               Home Medications    Prior to Admission medications   Medication Sig Start Date End Date Taking? Authorizing Provider  amLODipine (NORVASC) 5 MG tablet Take 5 mg by mouth daily.    [provider]  blood glucose meter kit and supplies KIT Dispense based on patient and insurance preference. Use up to four times daily as directed. (FOR ICD-9 250.00, 250.01). 01/10/16   Hedges, Dellis Filbert, PA-C  doxycycline (VIBRAMYCIN) 100 MG capsule Take 1 capsule (100 mg total) by mouth 2 (two) times daily. 01/27/18   Patrecia Pour, MD  ibuprofen (ADVIL,MOTRIN) 600 MG tablet Take 1 tablet (600 mg total) by mouth every 6 (six) hours as needed. 01/27/18   Patrecia Pour, MD  medroxyPROGESTERone (DEPO-PROVERA) 150 MG/ML injection Inject 150 mg into the muscle every 3 (three) months. Last injection 09/30/17    [provider]  medroxyPROGESTERone Acetate 150 MG/ML SUSY inject 68m into the muscle once  for1 dose 12/02/17   Fontaine, TBelinda Block MD  meloxicam (MOBIC) 7.5 MG tablet Take 1 tablet (7.5 mg total) by mouth daily. 10/19/17   YTasia Catchings Amy V, PA-C  metFORMIN (GLUCOPHAGE) 500 MG tablet Take 1,000 mg by mouth daily with breakfast.     [provider]  sulfamethoxazole-trimethoprim (BACTRIM DS,SEPTRA DS) 800-160 MG tablet Take 1 tablet by mouth 2 (two) times daily for 4 doses. 01/28/18 01/30/18  McDonald, Mia A, PA-C  sulfamethoxazole-trimethoprim (BACTRIM DS,SEPTRA DS) 800-160 MG tablet Take 1 tablet by mouth 2 (two) times daily for 10 days. 01/28/18 02/07/18  McDonald, Mia A, PA-C  vitamin C (ASCORBIC ACID) 500 MG tablet Take 500 mg by mouth daily.    [provider]    Family History Family History  Problem Relation Age of Onset  . Hypertension Mother   . Heart disease Mother 464      CHF  . Diabetes Mother   . Sickle cell anemia Brother   . Hypertension Maternal Grandfather   . Diabetes Maternal Grandfather   . Heart disease Maternal Grandfather   .  Diabetes Maternal Grandmother   . Heart disease Maternal Grandmother     Social History Social History   Tobacco Use  . Smoking status: Former Smoker    Packs/day: 0.50    Years: 2.00    Pack years: 1.00    Last attempt to quit: 09/08/2013    Years since quitting: 4.3  . Smokeless tobacco: Never Used  Substance Use Topics  . Alcohol use: No    Alcohol/week: 0.0 oz  . Drug use: No     Allergies   Iodides; Morphine and related; Shellfish allergy; and Ultram [tramadol hcl]   Review of Systems Review of Systems  Constitutional: Negative for chills and fever.  HENT: Negative for ear pain and sore throat.   Eyes: Negative for pain and visual disturbance.  Respiratory: Negative for cough and shortness of breath.   Cardiovascular: Negative for chest pain and palpitations.  Gastrointestinal: Negative for abdominal pain and vomiting.  Genitourinary: Negative for dysuria and hematuria.  Musculoskeletal: Negative for  arthralgias and back pain.  Skin: Positive for wound. Negative for color change and rash.  Neurological: Negative for seizures and syncope.  All other systems reviewed and are negative.    Physical Exam Triage Vital Signs ED Triage Vitals  Enc Vitals Group     BP 01/30/18 1023 (!) 158/98     Pulse Rate 01/30/18 1023 (!) 107     Resp 01/30/18 1023 18     Temp 01/30/18 1020 98 F (36.7 C)     Temp Source 01/30/18 1020 Oral     SpO2 01/30/18 1023 100 %     Weight --      Height --      Head Circumference --      Peak Flow --      Pain Score --      Pain Loc --      Pain Edu? --      Excl. in [place]? --    No data found.  Updated Vital Signs BP (!) 158/98   Pulse (!) 107   Temp 98.6 F (37 C)   Resp 18   SpO2 100%   Visual Acuity Right Eye Distance:   Left Eye Distance:   Bilateral Distance:    Right Eye Near:   Left Eye Near:    Bilateral Near:     Physical Exam  Constitutional: She appears well-developed and well-nourished.  Skin:  Packing was easily removed from incision.  There is small amount of drainage on the dressing and a small amount of serosanguineous drainage was expressed. Simple dressing was reapplied.     UC Treatments / Results  Labs (all labs ordered are listed, but only abnormal results are displayed) Labs Reviewed - No data to display  EKG None  Radiology No results found.  Procedures Procedures (including critical care time)  Medications Ordered in UC Medications - No data to display  Initial Impression / Assessment and Plan / UC Course  I have reviewed the triage vital signs and the nursing notes.  Pertinent labs & imaging results that were available during my care of the patient were reviewed by me and considered in my medical decision making (see chart for details).     Abscess/wound recheck Final Clinical Impressions(s) / UC Diagnoses   Final diagnoses:  None   Discharge Instructions   None    ED Prescriptions     None     Controlled Substance Prescriptions [place] Controlled Substance Registry consulted? No  Wardell Honour, MD 01/30/18 1049

## 2018-01-30 NOTE — Discharge Instructions (Addendum)
Patient instructed to remove dressing and shower each day and reapply dressing for as long as there is any drainage. Also encouraged to complete course of antibiotics given in the emergency room.

## 2018-01-30 NOTE — ED Triage Notes (Signed)
Pt arrives via POV from home here to get wound packing removed. Denies recent fevers. Still taking ABX. VSS.

## 2018-01-30 NOTE — ED Triage Notes (Signed)
Pt had back abscess that was drained in the ER, wants wound check and packing removed.

## 2018-02-11 ENCOUNTER — Ambulatory Visit (INDEPENDENT_AMBULATORY_CARE_PROVIDER_SITE_OTHER): Payer: BLUE CROSS/BLUE SHIELD | Admitting: Gynecology

## 2018-02-11 ENCOUNTER — Encounter: Payer: Self-pay | Admitting: Gynecology

## 2018-02-11 VITALS — BP 134/82 | Ht 63.0 in | Wt 210.0 lb

## 2018-02-11 DIAGNOSIS — Z01419 Encounter for gynecological examination (general) (routine) without abnormal findings: Secondary | ICD-10-CM

## 2018-02-11 DIAGNOSIS — Z3009 Encounter for other general counseling and advice on contraception: Secondary | ICD-10-CM

## 2018-02-11 DIAGNOSIS — Z113 Encounter for screening for infections with a predominantly sexual mode of transmission: Secondary | ICD-10-CM

## 2018-02-11 NOTE — Patient Instructions (Signed)
Call in follow up of the NUVARING trial

## 2018-02-11 NOTE — Progress Notes (Signed)
    Misty HanlyKatelin I [name] 11/21/1990 [id number]        27 y.o.  G1P0010 for annual gynecologic exam.  Also wants to discuss birth control.  Would like to try the Ortho Evra patch.  Had been on Depo-Provera with last documented shot in January.  Continues to have monthly menses just finishing a menses now.  Past medical history,surgical history, problem list, medications, allergies, family history and social history were all reviewed and documented as reviewed in the EPIC chart.  ROS:  Performed with pertinent positives and negatives included in the history, assessment and plan.   Additional significant findings : None   Exam: [name] assistant Vitals:   02/11/18 1042  BP: 134/82  Weight: 210 lb (95.3 kg)  Height: 5\' 3"  (1.6 m)   Body mass index is 37.2 kg/m.  General appearance:  Normal affect, orientation and appearance. Skin: Grossly normal HEENT: Without gross lesions.  No cervical or supraclavicular adenopathy. Thyroid normal.  Lungs:  Clear without wheezing, rales or rhonchi Cardiac: RR, without RMG Abdominal:  Soft, nontender, without masses, guarding, rebound, organomegaly or hernia Breasts:  Examined lying and sitting without masses, retractions, discharge or axillary adenopathy. Pelvic:  Ext, BUS, Vagina: Normal with scant menses  Cervix: Normal.  GC/chlamydia, Pap smear done  Uterus: Anteverted, normal size, shape and contour, midline and mobile nontender   Adnexa: Without masses or tenderness    Anus and perineum: Normal    Assessment/Plan:  27 y.o. 1001P0010 female for annual gynecologic exam with regular menses.   1. Contraceptive management.  We again discussed various options for contraception to include pill, patch, ring, Nexplanon, Depo-Provera and IUDs.  She has tried the pills in the past.  Also had been on Depo-Provera.  She would like to try the Ortho Evra patch.  Does not want the IUD.  I discussed possible trial of NuvaRing.  I reviewed both options as far as  how to use them and I think maneuvering would be less cumbersome for her.  We also discussed her medical issues to include hypertension and diabetes.  We reviewed the increased risk of thrombosis potential to include stroke heart attack DVT.  This must be weighed against her risk of pregnancy and the risks associated with this and her medical issues.  Her blood pressure today is 134/82.  She reports that her glucose values are in control.  After lengthy discussion and her understanding and accepting the risks we will go ahead and start with maneuvering to see how she does with this.  A sample was given to her and she will start this now she is at the tail end of her menses.  She will call me at the end of this trial as to how she likes it and if so we will go ahead and refill her at the phone call. 2. Gardasil series reportedly received. 3. STD screening.  GC/Chlamydia screen done.  Need for condoms regardless. 4. Breast health.  Breast exam normal today. 5. Pap smear 11/2015.  Pap smear done today at 2 to 3-year interval.  No history of abnormal Pap smears previously. 6. Health maintenance.  No routine lab work done as patient reports this done elsewhere.  Follow-up with NuvaRing report, follow-up in 1 year for annual exam.   Dara Lordsimothy P Reiner Loewen MD, 11:47 AM 02/11/2018

## 2018-02-12 LAB — PAP IG W/ RFLX HPV ASCU

## 2018-02-12 LAB — C. TRACHOMATIS/N. GONORRHOEAE RNA
C. trachomatis RNA, TMA: NOT DETECTED
N. gonorrhoeae RNA, TMA: NOT DETECTED

## 2018-03-12 ENCOUNTER — Ambulatory Visit: Payer: Self-pay | Admitting: Gynecology

## 2018-03-21 ENCOUNTER — Other Ambulatory Visit: Payer: Self-pay

## 2018-03-21 ENCOUNTER — Encounter (HOSPITAL_COMMUNITY): Payer: Self-pay | Admitting: *Deleted

## 2018-03-21 ENCOUNTER — Emergency Department (HOSPITAL_COMMUNITY)
Admission: EM | Admit: 2018-03-21 | Discharge: 2018-03-21 | Disposition: A | Discharge status: Home / Self Care | Payer: Self-pay | Attending: Emergency Medicine | Admitting: Emergency Medicine

## 2018-03-21 DIAGNOSIS — Z87891 Personal history of nicotine dependence: Secondary | ICD-10-CM | POA: Insufficient documentation

## 2018-03-21 DIAGNOSIS — E119 Type 2 diabetes mellitus without complications: Secondary | ICD-10-CM | POA: Insufficient documentation

## 2018-03-21 DIAGNOSIS — R55 Syncope and collapse: Secondary | ICD-10-CM | POA: Insufficient documentation

## 2018-03-21 DIAGNOSIS — I10 Essential (primary) hypertension: Secondary | ICD-10-CM | POA: Insufficient documentation

## 2018-03-21 DIAGNOSIS — Z7984 Long term (current) use of oral hypoglycemic drugs: Secondary | ICD-10-CM | POA: Insufficient documentation

## 2018-03-21 DIAGNOSIS — Z79899 Other long term (current) drug therapy: Secondary | ICD-10-CM | POA: Insufficient documentation

## 2018-03-21 LAB — I-STAT CHEM 8, ED
BUN: 12 mg/dL (ref 6–20)
CALCIUM ION: 1.11 mmol/L — AB (ref 1.15–1.40)
Chloride: 99 mmol/L (ref 98–111)
Creatinine, Ser: 0.8 mg/dL (ref 0.44–1.00)
GLUCOSE: 290 mg/dL — AB (ref 70–99)
HCT: 45 % (ref 36.0–46.0)
HEMOGLOBIN: 15.3 g/dL — AB (ref 12.0–15.0)
Potassium: 3.7 mmol/L (ref 3.5–5.1)
Sodium: 134 mmol/L — ABNORMAL LOW (ref 135–145)
TCO2: 23 mmol/L (ref 22–32)

## 2018-03-21 LAB — D-DIMER, QUANTITATIVE: D-Dimer, Quant: 0.42 ug/mL-FEU (ref 0.00–0.50)

## 2018-03-21 LAB — I-STAT TROPONIN, ED: TROPONIN I, POC: 0 ng/mL (ref 0.00–0.08)

## 2018-03-21 LAB — I-STAT BETA HCG BLOOD, ED (MC, WL, AP ONLY): I-stat hCG, quantitative: <5 m[IU]/mL (ref <5)

## 2018-03-21 MED ORDER — AMLODIPINE BESYLATE 5 MG PO TABS: 5.0000 mg | ORAL_TABLET | Freq: Every day | ORAL | 1 refills | Status: DC

## 2018-03-21 MED ORDER — SODIUM CHLORIDE 0.9 % IV BOLUS
1000.0000 mL | Freq: Once | INTRAVENOUS | Status: AC
  Administered 2018-03-21: 1000 mL via INTRAVENOUS

## 2018-03-21 MED ORDER — METFORMIN HCL 500 MG PO TABS: 1000.0000 mg | ORAL_TABLET | Freq: Every day | ORAL | 1 refills | Status: DC

## 2018-03-21 NOTE — Discharge Instructions (Signed)
Take your daily prescribed medications and follow-up with your primary care doctor regarding your visit.  Drink plenty of water to prevent dehydration.

## 2018-03-21 NOTE — ED Provider Notes (Signed)
[facility] DEPT Provider Note   CSN: [id number] Arrival date & time: 03/21/18  0123    History   Chief Complaint Chief Complaint  Patient presents with  . Near Syncope    HPI Misty [name] is a 27 y.o. female.  27 year old female with a history of asthma, diabetes, hypertension presents to the emergency department for evaluation of near syncope.  She states that she was walking home from work when she felt overheated as though she was going to pass out.  Patient noted to be diaphoretic and hypertensive on ED arrival.  She was tachycardic.  Patient states that she is feeling much better after receiving fluids.  She notes noncompliance with her metformin and Norvasc, stating that she has not had the money to pay for her prescription for the past 3 months.  She denies any chest pain or shortness of breath prior to onset of lightheadedness.  She has some mild nausea, but did not experience any vomiting.  Denies recent fevers or sick contacts.  The history is provided by the patient. No language interpreter was used.  Near Syncope     Past Medical History:  Diagnosis Date  . Allergy   . Anemia 2015  . Asthma    as child  . Diabetes mellitus without complication ([place]) 9563  . Eczema   . Hypertension 09/2013    There are no active problems to display for this patient.   Past Surgical History:  Procedure Laterality Date  . Abcess on back       OB History    Gravida  1   Para  0   Term  0   Preterm  0   AB  1   Living  0     SAB  1   TAB  0   Ectopic  0   Multiple  0   Live Births               Home Medications    Prior to Admission medications   Medication Sig Start Date End Date Taking? Authorizing Provider  amLODipine (NORVASC) 5 MG tablet Take 1 tablet (5 mg total) by mouth daily. 03/21/18   Antonietta Breach, PA-C  blood glucose meter kit and supplies KIT Dispense based on patient and insurance preference. Use up to  four times daily as directed. (FOR ICD-9 250.00, 250.01). 01/10/16   Hedges, Dellis Filbert, PA-C  metFORMIN (GLUCOPHAGE) 500 MG tablet Take 2 tablets (1,000 mg total) by mouth daily with breakfast. 03/21/18   Antonietta Breach, PA-C    Family History Family History  Problem Relation Age of Onset  . Hypertension Mother   . Heart disease Mother 77       CHF  . Diabetes Mother   . Sickle cell anemia Brother   . Hypertension Maternal Grandfather   . Diabetes Maternal Grandfather   . Heart disease Maternal Grandfather   . Diabetes Maternal Grandmother   . Heart disease Maternal Grandmother     Social History Social History   Tobacco Use  . Smoking status: Former Smoker    Packs/day: 0.50    Years: 2.00    Pack years: 1.00    Last attempt to quit: 09/08/2013    Years since quitting: 4.5  . Smokeless tobacco: Never Used  Substance Use Topics  . Alcohol use: No    Alcohol/week: 0.0 oz  . Drug use: No     Allergies   Iodides; Morphine and related;  Shellfish allergy; and Ultram [tramadol hcl]   Review of Systems Review of Systems  Cardiovascular: Positive for near-syncope.  Ten systems reviewed and are negative for acute change, except as noted in the HPI.    Physical Exam Updated Vital Signs BP 118/71   Pulse 92   Temp 98.6 F (37 C) (Oral)   Resp 19   Ht '5\' 3"'$  (1.6 m)   Wt 77.1 kg (170 lb)   LMP 02/05/2018   SpO2 100%   BMI 30.11 kg/m   Physical Exam  Constitutional: She is oriented to person, place, and time. She appears well-developed and well-nourished. No distress.  Nontoxic appearing, obese  HENT:  Head: Normocephalic and atraumatic.  Eyes: Conjunctivae and EOM are normal. No scleral icterus.  Neck: Normal range of motion.  Cardiovascular: Normal rate, regular rhythm and intact distal pulses.  NSR; not tachycardic as noted in triage.  Pulmonary/Chest: Effort normal. No stridor. No respiratory distress. She has no wheezes. She has no rales.  Respirations even and  unlabored  Abdominal:  Soft, obese, nontender abdomen.  Musculoskeletal: Normal range of motion.  Neurological: She is alert and oriented to person, place, and time. She exhibits normal muscle tone. Coordination normal.  Skin: Skin is warm and dry. No rash noted. She is not diaphoretic. No erythema. No pallor.  Psychiatric: She has a normal mood and affect. Her behavior is normal.  Nursing note and vitals reviewed.    ED Treatments / Results  Labs (all labs ordered are listed, but only abnormal results are displayed) Labs Reviewed  I-STAT CHEM 8, ED - Abnormal; Notable for the following components:      Result Value   Sodium 134 (*)    Glucose, Bld 290 (*)    Calcium, Ion 1.11 (*)    Hemoglobin 15.3 (*)    All other components within normal limits  D-DIMER, QUANTITATIVE (NOT AT [facility])  I-STAT TROPONIN, ED  I-STAT BETA HCG BLOOD, ED ([facility], [facility], [facility] ONLY)    EKG EKG Interpretation  Date/Time:  Friday March 21 2018 02:36:23 EDT Ventricular Rate:  81 PR Interval:    QRS Duration: 78 QT Interval:  373 QTC Calculation: 433 R Axis:   43 Text Interpretation:  Sinus rhythm Confirmed by Randal Buba, April ([id number]) on 03/21/2018 2:40:03 AM   Radiology No results found.  Procedures Procedures (including critical care time)  Medications Ordered in ED Medications  sodium chloride 0.9 % bolus 1,000 mL (0 mLs Intravenous Stopped 03/21/18 0325)     Initial Impression / Assessment and Plan / ED Course  I have reviewed the triage vital signs and the nursing notes.  Pertinent labs & imaging results that were available during my care of the patient were reviewed by me and considered in my medical decision making (see chart for details).     27 year old female presents to the emergency department for an episode of near syncope.  She was allegedly diaphoretic and hypertensive with EMS.  Patient noted to be normotensive on ED arrival with only mild tachycardia.  Patient states that her symptoms  improved after IV fluid hydration.  She has been out of her metformin and Norvasc for the past 3 months secondary to financial constraints.  Patient with no preceding chest pain or shortness of breath associated with her near syncope this evening.  She has a reassuring laboratory work-up with stable electrolytes and negative troponin.  D-dimer is also negative ruling out pulmonary embolus.  She has hyperglycemia which is secondary to  metformin noncompliance.  No elevated anion gap or concern for diabetic ketoacidosis.  Patient has remained hemodynamically stable in the ED since arrival.  No clinical decompensation.  She has a negative [place] syncope score.  I do not believe further emergent work-up is indicated, but have encouraged primary care follow-up for further evaluation of symptoms.  Encouraged continued fluid hydration with return if symptoms worsen.  Return precautions discussed and provided. Patient discharged in stable condition with no unaddressed concerns.   Final Clinical Impressions(s) / ED Diagnoses   Final diagnoses:  Near syncope    ED Discharge Orders        Ordered    amLODipine (NORVASC) 5 MG tablet  Daily     03/21/18 0416    metFORMIN (GLUCOPHAGE) 500 MG tablet  Daily with breakfast     03/21/18 0416       Antonietta Breach, PA-C 03/21/18 0539    Palumbo, April, MD 03/21/18 2542

## 2018-03-21 NOTE — ED Triage Notes (Signed)
EMS called out for "near syncope". Upon arrival they found pt to be diaphoretic and hypertensive at 220/140 tachycardic 120s ST on 12 lead in field. Pt with known history of hypertension not taking her medications d/t unable to obtain. Pt also with known history of DM and states to EMS that she is on Metformin. It is unclear if pt has been taking metformin dose. Pt states that she last ate at 1800 on 7/18 and hasn't been taking much po fluids. EMS also noted jaundice appearing sclera. Pt reported dizziness to EMS.

## 2018-03-21 NOTE — ED Notes (Signed)
Bed: [id number] Expected date:  Expected time:  Means of arrival:  Comments: EMS- htn

## 2018-04-01 ENCOUNTER — Telehealth: Payer: Self-pay

## 2018-04-01 NOTE — Telephone Encounter (Signed)
Patient said she was in for visit in June. You prescribed the Nuvaring but "that did not work for me" and she would like to try the patch. Rx?

## 2018-04-01 NOTE — Telephone Encounter (Signed)
Okay for OrthoEvra equivalent refill through when next annual exam due

## 2018-04-02 NOTE — Telephone Encounter (Signed)
Left message for patient that Dr. Velvet BatheF ok'd patch. Asked her to call me with pharmacy.

## 2018-04-09 MED ORDER — NORELGESTROMIN-ETH ESTRADIOL 150-35 MCG/24HR TD PTWK: 1.0000 | MEDICATED_PATCH | TRANSDERMAL | 3 refills | Status: DC

## 2018-04-09 NOTE — Telephone Encounter (Signed)
[facility], Rx sent.

## 2018-04-17 ENCOUNTER — Telehealth: Payer: Self-pay | Admitting: *Deleted

## 2018-04-17 NOTE — Telephone Encounter (Signed)
Patient called c/o thick white milky discharge x 4 days, no odor, no discharge. Transferred to appointment desk to schedule visit.

## 2018-04-24 ENCOUNTER — Ambulatory Visit: Payer: Self-pay | Admitting: Gynecology

## 2018-05-24 ENCOUNTER — Emergency Department (HOSPITAL_COMMUNITY)
Admission: EM | Admit: 2018-05-24 | Discharge: 2018-05-24 | Disposition: A | Discharge status: Home / Self Care | Payer: Self-pay | Attending: Emergency Medicine | Admitting: Emergency Medicine

## 2018-05-24 ENCOUNTER — Encounter (HOSPITAL_COMMUNITY): Payer: Self-pay | Admitting: Emergency Medicine

## 2018-05-24 ENCOUNTER — Other Ambulatory Visit: Payer: Self-pay

## 2018-05-24 DIAGNOSIS — Z79899 Other long term (current) drug therapy: Secondary | ICD-10-CM | POA: Insufficient documentation

## 2018-05-24 DIAGNOSIS — Z87891 Personal history of nicotine dependence: Secondary | ICD-10-CM | POA: Insufficient documentation

## 2018-05-24 DIAGNOSIS — L739 Follicular disorder, unspecified: Secondary | ICD-10-CM | POA: Insufficient documentation

## 2018-05-24 DIAGNOSIS — E119 Type 2 diabetes mellitus without complications: Secondary | ICD-10-CM | POA: Insufficient documentation

## 2018-05-24 MED ORDER — CEPHALEXIN 500 MG PO CAPS: 500.0000 mg | ORAL_CAPSULE | Freq: Three times a day (TID) | ORAL | 0 refills | Status: AC

## 2018-05-24 NOTE — Discharge Instructions (Addendum)
Apply warm compresses to area for 20 minutes at a time.  Take [facility]med[facility] as prescribed and complete the full course.

## 2018-05-24 NOTE — ED Provider Notes (Signed)
[facility] EMERGENCY DEPARTMENT Provider Note   CSN: [id number] Arrival date & time: 05/24/18  4098     History   Chief Complaint Chief Complaint  Patient presents with  . Cyst    head    HPI Misty [name] is a 27 y.o. female.  27 year old female presents with complaint of knots on the back of her scalp.  Patient states that she took the braids out of her hair yesterday and she noticed the knots.  States the areas are not tender, they are somewhat itchy.  Denies drainage from the area.  No other complaints or concerns.     Past Medical History:  Diagnosis Date  . Allergy   . Anemia 2015  . Asthma    as child  . Diabetes mellitus without complication ([place]) 1191  . Eczema   . Hypertension 09/2013    There are no active problems to display for this patient.   Past Surgical History:  Procedure Laterality Date  . Abcess on back       OB History    Gravida  1   Para  0   Term  0   Preterm  0   AB  1   Living  0     SAB  1   TAB  0   Ectopic  0   Multiple  0   Live Births               Home Medications    Prior to Admission medications   Medication Sig Start Date End Date Taking? Authorizing Provider  amLODipine (NORVASC) 5 MG tablet Take 1 tablet (5 mg total) by mouth daily. 03/21/18   Antonietta Breach, PA-C  blood glucose meter kit and supplies KIT Dispense based on patient and insurance preference. Use up to four times daily as directed. (FOR ICD-9 250.00, 250.01). 01/10/16   Hedges, Dellis Filbert, PA-C  cephALEXin (KEFLEX) 500 MG capsule Take 1 capsule (500 mg total) by mouth 3 (three) times daily for 7 days. 05/24/18 05/31/18  Tacy Learn, PA-C  metFORMIN (GLUCOPHAGE) 500 MG tablet Take 2 tablets (1,000 mg total) by mouth daily with breakfast. 03/21/18   Antonietta Breach, PA-C  norelgestromin-ethinyl estradiol (ORTHO EVRA) 150-35 MCG/24HR transdermal patch Place 1 patch onto the skin once a week. 04/09/18   Fontaine, Belinda Block, MD     Family History Family History  Problem Relation Age of Onset  . Hypertension Mother   . Heart disease Mother 97       CHF  . Diabetes Mother   . Sickle cell anemia Brother   . Hypertension Maternal Grandfather   . Diabetes Maternal Grandfather   . Heart disease Maternal Grandfather   . Diabetes Maternal Grandmother   . Heart disease Maternal Grandmother     Social History Social History   Tobacco Use  . Smoking status: Former Smoker    Packs/day: 0.50    Years: 2.00    Pack years: 1.00    Last attempt to quit: 09/08/2013    Years since quitting: 4.7  . Smokeless tobacco: Never Used  Substance Use Topics  . Alcohol use: No    Alcohol/week: 0.0 standard drinks  . Drug use: No     Allergies   Iodides; Morphine and related; Shellfish allergy; and Ultram [tramadol hcl]   Review of Systems Review of Systems  Constitutional: Negative for fever.  Musculoskeletal: Negative for neck pain.  Skin: Negative for rash and wound.  Allergic/Immunologic: Negative for immunocompromised state.  Neurological: Negative for headaches.  Hematological: Negative for adenopathy.  All other systems reviewed and are negative.    Physical Exam Updated Vital Signs BP 133/88 (BP Location: Left Arm)   Pulse (!) 107   Temp 99.5 F (37.5 C) (Oral)   Resp 18   Ht '5\' 3"'$  (1.6 m)   LMP 05/18/2018   SpO2 100%   BMI 30.11 kg/m   Physical Exam  Constitutional: She is oriented to person, place, and time. She appears well-developed and well-nourished. No distress.  HENT:  Head: Normocephalic and atraumatic.  Neck: Neck supple.  Pulmonary/Chest: Effort normal.  Musculoskeletal: She exhibits no tenderness or deformity.  Lymphadenopathy:    She has no cervical adenopathy.  Neurological: She is alert and oriented to person, place, and time.  Skin: Skin is warm and dry. She is not diaphoretic. No erythema.     Psychiatric: She has a normal mood and affect. Her behavior is normal.   Nursing note and vitals reviewed.    ED Treatments / Results  Labs (all labs ordered are listed, but only abnormal results are displayed) Labs Reviewed - No data to display  EKG None  Radiology No results found.  Procedures Procedures (including critical care time)  Medications Ordered in ED Medications - No data to display   Initial Impression / Assessment and Plan / ED Course  I have reviewed the triage vital signs and the nursing notes.  Pertinent labs & imaging results that were available during my care of the patient were reviewed by me and considered in my medical decision making (see chart for details).  Clinical Course as of May 24 1020  Sat May 24, 4154  [id number] 27 year old female with knots to the occipital area that she first noticed after removing her braids from her hair yesterday.  Denies pain or drainage in the area.  No obvious abscess at this time, question numerous sebaceous cyst versus folliculitis.  Patient will be given a flex, recommend warm compresses, advised not to squeeze or try to drain areas, recheck if needed.   [LM]    Clinical Course User Index [LM] Tacy Learn, PA-C   Final Clinical Impressions(s) / ED Diagnoses   Final diagnoses:  Folliculitis    ED Discharge Orders         Ordered    cephALEXin (KEFLEX) 500 MG capsule  3 times daily     05/24/18 1013           Tacy Learn, PA-C 05/24/18 1021    Orpah Greek, MD 05/25/18 0600

## 2018-05-24 NOTE — ED Triage Notes (Signed)
Pt. Stated, I have this knot on the back of my head for 2 days. Its on the right bottom.

## 2018-06-23 ENCOUNTER — Emergency Department (HOSPITAL_COMMUNITY)
Admission: EM | Admit: 2018-06-23 | Discharge: 2018-06-23 | Disposition: A | Discharge status: Home / Self Care | Payer: Self-pay | Attending: Emergency Medicine | Admitting: Emergency Medicine

## 2018-06-23 ENCOUNTER — Other Ambulatory Visit: Payer: Self-pay

## 2018-06-23 ENCOUNTER — Encounter (HOSPITAL_COMMUNITY): Payer: Self-pay

## 2018-06-23 DIAGNOSIS — R739 Hyperglycemia, unspecified: Secondary | ICD-10-CM

## 2018-06-23 DIAGNOSIS — I10 Essential (primary) hypertension: Secondary | ICD-10-CM | POA: Insufficient documentation

## 2018-06-23 DIAGNOSIS — Z7984 Long term (current) use of oral hypoglycemic drugs: Secondary | ICD-10-CM | POA: Insufficient documentation

## 2018-06-23 DIAGNOSIS — J45909 Unspecified asthma, uncomplicated: Secondary | ICD-10-CM | POA: Insufficient documentation

## 2018-06-23 DIAGNOSIS — L0201 Cutaneous abscess of face: Secondary | ICD-10-CM | POA: Insufficient documentation

## 2018-06-23 DIAGNOSIS — Z87891 Personal history of nicotine dependence: Secondary | ICD-10-CM | POA: Insufficient documentation

## 2018-06-23 DIAGNOSIS — Z79899 Other long term (current) drug therapy: Secondary | ICD-10-CM | POA: Insufficient documentation

## 2018-06-23 DIAGNOSIS — L0291 Cutaneous abscess, unspecified: Secondary | ICD-10-CM

## 2018-06-23 DIAGNOSIS — E1165 Type 2 diabetes mellitus with hyperglycemia: Secondary | ICD-10-CM | POA: Insufficient documentation

## 2018-06-23 LAB — CBG MONITORING, ED: Glucose-Capillary: 194 mg/dL — ABNORMAL HIGH (ref 70–99)

## 2018-06-23 MED ORDER — CEPHALEXIN 500 MG PO CAPS: 500.0000 mg | ORAL_CAPSULE | Freq: Four times a day (QID) | ORAL | 0 refills | Status: DC

## 2018-06-23 MED ORDER — TRAMADOL HCL 50 MG PO TABS: 50.0000 mg | ORAL_TABLET | Freq: Four times a day (QID) | ORAL | 0 refills | Status: DC | PRN

## 2018-06-23 MED ORDER — LIDOCAINE-EPINEPHRINE (PF) 2 %-1:200000 IJ SOLN
10.0000 mL | Freq: Once | INTRAMUSCULAR | Status: AC
  Administered 2018-06-23: 10 mL
  Filled 2018-06-23: qty 20

## 2018-06-23 MED ORDER — DOXYCYCLINE HYCLATE 100 MG PO CAPS: 100.0000 mg | ORAL_CAPSULE | Freq: Two times a day (BID) | ORAL | 0 refills | Status: AC

## 2018-06-23 NOTE — Discharge Instructions (Addendum)
Please take Ibuprofen (Advil, motrin) and Tylenol (acetaminophen) to relieve your pain.  You may take up to 600 MG (3 pills) of normal strength ibuprofen every 8 hours as needed.  In between doses of ibuprofen you make take tylenol, up to 1,000 mg (two extra strength pills).  Do not take more than 3,000 mg tylenol in a 24 hour period.  Please check all medication labels as many medications such as pain and cold medications may contain tylenol.  Do not drink alcohol while taking these medications.  Do not take other NSAID'S while taking ibuprofen (such as aleve or naproxen).  Please take ibuprofen with food to decrease stomach upset.   You are being prescribed a medication which may make you sleepy. For 24 hours after one dose please do not drive, operate heavy machinery, care for a small child with out another adult present, or perform any activities that may cause harm to you or someone else if you were to fall asleep or be impaired.   I have given you a prescription for short course of tramadol.  It is important that you take both of your antibiotics.  Please see your primary care doctor in the next 2 days for a recheck, return to the emergency room sooner if you have worsening symptoms.  We discussed options for blood work given that your heart rate is slightly up which you declined.

## 2018-06-23 NOTE — ED Triage Notes (Signed)
Pt c/o abcess to chin, also feeling like "sugar is high", no energy, lightheaded.  Denies dizziness.  No appetitie, does have DM.  Has not checked cbg in 3 days, does not have machine.

## 2018-06-23 NOTE — ED Notes (Signed)
ED Provider at bedside. 

## 2018-06-23 NOTE — ED Provider Notes (Addendum)
[facility] EMERGENCY DEPARTMENT Provider Note   CSN: [id number] Arrival date & time: 06/23/18  1019     History   Chief Complaint No chief complaint on file.   HPI Misty KURDZIEL is a 27 y.o. female who presents today for evaluation of an abscess on her chin.  She has had multiple abscesses in the past and states that this is been bothering her for the past 2 days.  She says that it feels the same as her abscesses usually do.  She denies any fevers at home.  She does state that she has little energy, she is not checking her sugars regularly at home.  She expresses anxiety over the I&D as she has had multiple in the past.  Chart review performed, shows that usually when she is here for an abscess her heart rate is elevated.    She denies possibility of pregnancy.  HPI  Past Medical History:  Diagnosis Date  . Allergy   . Anemia 2015  . Asthma    as child  . Diabetes mellitus without complication ([place]) 3419  . Eczema   . Hypertension 09/2013    There are no active problems to display for this patient.   Past Surgical History:  Procedure Laterality Date  . Abcess on back       OB History    Gravida  1   Para  0   Term  0   Preterm  0   AB  1   Living  0     SAB  1   TAB  0   Ectopic  0   Multiple  0   Live Births               Home Medications    Prior to Admission medications   Medication Sig Start Date End Date Taking? Authorizing Provider  amLODipine (NORVASC) 5 MG tablet Take 1 tablet (5 mg total) by mouth daily. 03/21/18   Antonietta Breach, PA-C  blood glucose meter kit and supplies KIT Dispense based on patient and insurance preference. Use up to four times daily as directed. (FOR ICD-9 250.00, 250.01). 01/10/16   Hedges, Dellis Filbert, PA-C  cephALEXin (KEFLEX) 500 MG capsule Take 1 capsule (500 mg total) by mouth 4 (four) times daily. 06/23/18   Lorin Glass, PA-C  doxycycline (VIBRAMYCIN) 100 MG capsule Take 1 capsule  (100 mg total) by mouth 2 (two) times daily for 7 days. 06/23/18 06/30/18  Lorin Glass, PA-C  metFORMIN (GLUCOPHAGE) 500 MG tablet Take 2 tablets (1,000 mg total) by mouth daily with breakfast. 03/21/18   Antonietta Breach, PA-C  norelgestromin-ethinyl estradiol (ORTHO EVRA) 150-35 MCG/24HR transdermal patch Place 1 patch onto the skin once a week. 04/09/18   Fontaine, Belinda Block, MD  traMADol (ULTRAM) 50 MG tablet Take 1 tablet (50 mg total) by mouth every 6 (six) hours as needed. 06/23/18   Lorin Glass, PA-C    Family History Family History  Problem Relation Age of Onset  . Hypertension Mother   . Heart disease Mother 77       CHF  . Diabetes Mother   . Sickle cell anemia Brother   . Hypertension Maternal Grandfather   . Diabetes Maternal Grandfather   . Heart disease Maternal Grandfather   . Diabetes Maternal Grandmother   . Heart disease Maternal Grandmother     Social History Social History   Tobacco Use  . Smoking status: Former Smoker  Packs/day: 0.50    Years: 2.00    Pack years: 1.00    Last attempt to quit: 09/08/2013    Years since quitting: 4.7  . Smokeless tobacco: Never Used  Substance Use Topics  . Alcohol use: No    Alcohol/week: 0.0 standard drinks  . Drug use: No     Allergies   Iodides; Morphine and related; Shellfish allergy; and Ultram [tramadol hcl]   Review of Systems Review of Systems  Constitutional: Positive for fatigue. Negative for chills and fever.  HENT: Positive for facial swelling.   Eyes: Negative for visual disturbance.  Endocrine: Negative for polydipsia and polyuria.  Skin: Positive for wound.  Neurological: Negative for weakness.  All other systems reviewed and are negative.    Physical Exam Updated Vital Signs BP (!) 149/110 (BP Location: Right Arm)   Pulse (!) 109   Temp 99.7 F (37.6 C) (Oral)   Ht '5\' 3"'$  (1.6 m)   Wt 81.6 kg   LMP 06/21/2018 (Approximate)   SpO2 100%   BMI 31.89 kg/m   Physical Exam    Constitutional: She appears well-developed. No distress.  HENT:  2 cm x 2 cm area of induration with a central wound draining purulent material in the center of the chin.  There is no elevation to the floor the mouth.   Neck: Neck supple.  Cardiovascular:  Tachycardic  Lymphadenopathy:    She has no cervical adenopathy.  Neurological: She is alert.  Skin: Skin is warm and dry. She is not diaphoretic.  Psychiatric: Her mood appears anxious.  Nursing note and vitals reviewed.    ED Treatments / Results  Labs (all labs ordered are listed, but only abnormal results are displayed) Labs Reviewed  CBG MONITORING, ED - Abnormal; Notable for the following components:      Result Value   Glucose-Capillary 194 (*)    All other components within normal limits    EKG None  Radiology No results found.  Procedures .Marland KitchenIncision and Drainage Date/Time: 06/23/2018 12:14 PM Performed by: Lorin Glass, PA-C Authorized by: Lorin Glass, PA-C   Consent:    Consent obtained:  Verbal   Consent given by:  Patient   Risks discussed:  Bleeding, incomplete drainage, pain and infection (Damage to other structures, need for additional procedures)   Alternatives discussed:  No treatment, alternative treatment and referral Location:    Type:  Abscess   Size:  1cmx1cm   Location:  Head   Head location:  Face Pre-procedure details:    Skin preparation:  Chloraprep Anesthesia (see MAR for exact dosages):    Anesthesia method:  Local infiltration   Local anesthetic:  Lidocaine 2% WITH epi Procedure type:    Complexity:  Complex Procedure details:    Incision types:  Stab incision   Incision depth:  Subcutaneous   Scalpel blade:  11   Wound management:  Probed and deloculated and irrigated with saline   Drainage:  Purulent   Drainage amount:  Moderate   Wound treatment:  Wound left open   Packing materials:  None Post-procedure details:    Patient tolerance of procedure:   Tolerated with difficulty Comments:     Discussed with patient before and risks of severing nerve and vessels that control face, along with multiple other complications.  Patient tolerated procedure with difficulty due to pressure, and anxiety.  She denies sharp feelings, indicating adequate lidocaine anesthesia.   (including critical care time)  Medications Ordered in ED Medications  lidocaine-EPINEPHrine (XYLOCAINE W/EPI) 2 %-1:200000 (PF) injection 10 mL (has no administration in time range)     Initial Impression / Assessment and Plan / ED Course  I have reviewed the triage vital signs and the nursing notes.  Pertinent labs & imaging results that were available during my care of the patient were reviewed by me and considered in my medical decision making (see chart for details).    Patient with skin abscess amenable to incision and drainage.  Abscess was not large enough to warrant packing or drain,  wound recheck in 2 days.  Encourage patient to have strict glycemic control, she has not run out of any of her diabetes medicines.  She is mildly tachycardic here, along with hypertensive.  She was informed of both of these.  Discussed with her the risks and benefits of work, patient consistently refuses blood work, and states the understanding of doing so.  Chart review does show that when she has an abscess she is usually tachycardic between 100 and 110.  encouraged home warm soaks and flushing.  Mild signs of cellulitis is surrounding skin.  Will d/c to home.  Her sugar is mildly elevated today.  Given location, and immunosuppressed due to poor glycemic control will discharge with doxycycline, and Keflex.  We did discuss risks of taking doxycycline while pregnant, she states that she knows she is not pregnant, and does not want a pregnancy test before hand.  Return precautions were discussed with patient who states their understanding.  At the time of discharge patient denied any unaddressed  complaints or concerns.  Patient is agreeable for discharge home.   Final Clinical Impressions(s) / ED Diagnoses   Final diagnoses:  Abscess  Hyperglycemia    ED Discharge Orders         Ordered    doxycycline (VIBRAMYCIN) 100 MG capsule  2 times daily     06/23/18 1205    cephALEXin (KEFLEX) 500 MG capsule  4 times daily     06/23/18 1205    traMADol (ULTRAM) 50 MG tablet  Every 6 hours PRN     06/23/18 1205             Lorin Glass, PA-C 06/23/18 1218    Sherwood Gambler, MD 06/23/18 [phone]

## 2018-06-23 NOTE — ED Notes (Signed)
Discharge instructions given about how to antibiotics. Pt reports understanding and has no further questions.   

## 2018-08-15 ENCOUNTER — Other Ambulatory Visit: Payer: Self-pay

## 2018-08-15 ENCOUNTER — Encounter (HOSPITAL_COMMUNITY): Payer: Self-pay

## 2018-08-15 ENCOUNTER — Emergency Department (HOSPITAL_COMMUNITY)
Admission: EM | Admit: 2018-08-15 | Discharge: 2018-08-15 | Disposition: A | Discharge status: Home / Self Care | Payer: Self-pay | Attending: Emergency Medicine | Admitting: Emergency Medicine

## 2018-08-15 DIAGNOSIS — I10 Essential (primary) hypertension: Secondary | ICD-10-CM | POA: Insufficient documentation

## 2018-08-15 DIAGNOSIS — E119 Type 2 diabetes mellitus without complications: Secondary | ICD-10-CM | POA: Insufficient documentation

## 2018-08-15 DIAGNOSIS — R112 Nausea with vomiting, unspecified: Secondary | ICD-10-CM | POA: Insufficient documentation

## 2018-08-15 DIAGNOSIS — R197 Diarrhea, unspecified: Secondary | ICD-10-CM | POA: Insufficient documentation

## 2018-08-15 DIAGNOSIS — Z87891 Personal history of nicotine dependence: Secondary | ICD-10-CM | POA: Insufficient documentation

## 2018-08-15 DIAGNOSIS — Z7984 Long term (current) use of oral hypoglycemic drugs: Secondary | ICD-10-CM | POA: Insufficient documentation

## 2018-08-15 DIAGNOSIS — Z79899 Other long term (current) drug therapy: Secondary | ICD-10-CM | POA: Insufficient documentation

## 2018-08-15 LAB — URINALYSIS, ROUTINE W REFLEX MICROSCOPIC
BILIRUBIN URINE: NEGATIVE
Bacteria, UA: NONE SEEN
Ketones, ur: 80 mg/dL — AB
NITRITE: NEGATIVE
PH: 5 (ref 5.0–8.0)
Protein, ur: NEGATIVE mg/dL
Specific Gravity, Urine: 1.038 — ABNORMAL HIGH (ref 1.005–1.030)
WBC, UA: >50 WBC/hpf — ABNORMAL HIGH (ref 0–5)

## 2018-08-15 LAB — COMPREHENSIVE METABOLIC PANEL
ALK PHOS: 111 U/L (ref 38–126)
ALT: 19 U/L (ref 0–44)
ANION GAP: 13 (ref 5–15)
AST: 18 U/L (ref 15–41)
Albumin: 3.9 g/dL (ref 3.5–5.0)
BILIRUBIN TOTAL: 0.7 mg/dL (ref 0.3–1.2)
BUN: 8 mg/dL (ref 6–20)
CO2: 23 mmol/L (ref 22–32)
Calcium: 9 mg/dL (ref 8.9–10.3)
Chloride: 99 mmol/L (ref 98–111)
Creatinine, Ser: 0.71 mg/dL (ref 0.44–1.00)
GFR calc non Af Amer: >60 mL/min (ref >60)
Glucose, Bld: 323 mg/dL — ABNORMAL HIGH (ref 70–99)
Potassium: 4.6 mmol/L (ref 3.5–5.1)
SODIUM: 135 mmol/L (ref 135–145)
TOTAL PROTEIN: 7.4 g/dL (ref 6.5–8.1)

## 2018-08-15 LAB — CBC WITH DIFFERENTIAL/PLATELET
Abs Immature Granulocytes: 0.02 10*3/uL (ref 0.00–0.07)
Basophils Absolute: 0 10*3/uL (ref 0.0–0.1)
Basophils Relative: 0 %
EOS ABS: 0 10*3/uL (ref 0.0–0.5)
EOS PCT: 0 %
HCT: 43.6 % (ref 36.0–46.0)
HEMOGLOBIN: 14.3 g/dL (ref 12.0–15.0)
Immature Granulocytes: 0 %
LYMPHS PCT: 5 %
Lymphs Abs: 0.4 10*3/uL — ABNORMAL LOW (ref 0.7–4.0)
MCH: 30.3 pg (ref 26.0–34.0)
MCHC: 32.8 g/dL (ref 30.0–36.0)
MCV: 92.4 fL (ref 80.0–100.0)
MONO ABS: 0.4 10*3/uL (ref 0.1–1.0)
Monocytes Relative: 6 %
Neutro Abs: 5.9 10*3/uL (ref 1.7–7.7)
Neutrophils Relative %: 89 %
Platelets: 311 10*3/uL (ref 150–400)
RBC: 4.72 MIL/uL (ref 3.87–5.11)
RDW: 12.3 % (ref 11.5–15.5)
WBC: 6.7 10*3/uL (ref 4.0–10.5)
nRBC: 0 % (ref 0.0–0.2)

## 2018-08-15 LAB — CBG MONITORING, ED
GLUCOSE-CAPILLARY: 303 mg/dL — AB (ref 70–99)
Glucose-Capillary: 277 mg/dL — ABNORMAL HIGH (ref 70–99)

## 2018-08-15 LAB — PREGNANCY, URINE: Preg Test, Ur: NEGATIVE

## 2018-08-15 LAB — LIPASE, BLOOD: Lipase: 30 U/L (ref 11–51)

## 2018-08-15 MED ORDER — DICYCLOMINE HCL 20 MG PO TABS: 20.0000 mg | ORAL_TABLET | Freq: Two times a day (BID) | ORAL | 0 refills | Status: DC

## 2018-08-15 MED ORDER — METOCLOPRAMIDE HCL 5 MG/ML IJ SOLN
10.0000 mg | Freq: Once | INTRAMUSCULAR | Status: AC
Start: 2018-08-15 — End: 2018-08-15
  Administered 2018-08-15: 10 mg via INTRAVENOUS
  Filled 2018-08-15: qty 2

## 2018-08-15 MED ORDER — ONDANSETRON 4 MG PO TBDP: 4.0000 mg | ORAL_TABLET | Freq: Three times a day (TID) | ORAL | 0 refills | Status: DC | PRN

## 2018-08-15 MED ORDER — SODIUM CHLORIDE 0.9 % IV BOLUS
1000.0000 mL | Freq: Once | INTRAVENOUS | Status: AC
Start: 2018-08-15 — End: 2018-08-15
  Administered 2018-08-15: 1000 mL via INTRAVENOUS

## 2018-08-15 MED ORDER — AMLODIPINE BESYLATE 5 MG PO TABS: 5.0000 mg | ORAL_TABLET | Freq: Every day | ORAL | 0 refills | Status: DC

## 2018-08-15 MED ORDER — SODIUM CHLORIDE 0.9 % IV BOLUS
1000.0000 mL | Freq: Once | INTRAVENOUS | Status: AC
  Administered 2018-08-15: 1000 mL via INTRAVENOUS

## 2018-08-15 MED ORDER — NAPROXEN 500 MG PO TABS: 500.0000 mg | ORAL_TABLET | Freq: Two times a day (BID) | ORAL | 0 refills | Status: DC

## 2018-08-15 MED ORDER — KETOROLAC TROMETHAMINE 15 MG/ML IJ SOLN
15.0000 mg | Freq: Once | INTRAMUSCULAR | Status: AC
Start: 2018-08-15 — End: 2018-08-15
  Administered 2018-08-15: 15 mg via INTRAVENOUS
  Filled 2018-08-15: qty 1

## 2018-08-15 NOTE — ED Notes (Signed)
Pt was given diet ginger ale.

## 2018-08-15 NOTE — ED Notes (Signed)
Pt is tolerating PO diet beverages and crackers.

## 2018-08-15 NOTE — ED Triage Notes (Addendum)
Pt BIBA for c/o n/v and abd pain that began around 2 am today ; pt has hx of diabetes ; Upon EMS arrival patients blood sugar was 437 ; pt received a 250 ml bolus and 4 mg of zofran prior to arrival ; no active vomiting at this time; glucose 303 at this time

## 2018-08-15 NOTE — ED Provider Notes (Signed)
[facility] EMERGENCY DEPARTMENT Provider Note   CSN: [id number] Arrival date & time: 08/15/18  1051     History   Chief Complaint Chief Complaint  Patient presents with  . Nausea  . Abdominal Pain  . Hyperglycemia    HPI Misty [name] is a 27 y.o. female who presents with abdominal pain, N/V/D. PMH significant for Type 2 DM on Metformin, eczema, HTN. 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 She started to have intermittent generalized abdominal pain for the past 2 days. Last night she developed nausea and multiple episodes of non-bloody non-bilious emesis and non-bloody diarrhea. She called EMS this morning and her BG was reading ~400. She was given fluids and Zofran prior to arrival but still feels nauseous. She denies fever but has had chills. No chest pain, SOB, urinary symptoms. LMP was last week. No prior abdominal surgeries.  HPI  Past Medical History:  Diagnosis Date  . Allergy   . Anemia 2015  . Asthma    as child  . Diabetes mellitus without complication ([place]) 1017  . Eczema   . Hypertension 09/2013    There are no active problems to display for this patient.   Past Surgical History:  Procedure Laterality Date  . Abcess on back       OB History    Gravida  1   Para  0   Term  0   Preterm  0   AB  1   Living  0     SAB  1   TAB  0   Ectopic  0   Multiple  0   Live Births               Home Medications    Prior to Admission medications   Medication Sig Start Date End Date Taking? Authorizing Provider  amLODipine (NORVASC) 5 MG tablet Take 1 tablet (5 mg total) by mouth daily. 03/21/18   Antonietta Breach, PA-C  blood glucose meter kit and supplies KIT Dispense based on patient and insurance preference. Use up to four times daily as directed. (FOR ICD-9 250.00, 250.01). 01/10/16   Hedges, Dellis Filbert, PA-C  cephALEXin (KEFLEX) 500 MG capsule Take 1 capsule (500 mg total) by mouth 4  (four) times daily. 06/23/18   Lorin Glass, PA-C  metFORMIN (GLUCOPHAGE) 500 MG tablet Take 2 tablets (1,000 mg total) by mouth daily with breakfast. 03/21/18   Antonietta Breach, PA-C  norelgestromin-ethinyl estradiol (ORTHO EVRA) 150-35 MCG/24HR transdermal patch Place 1 patch onto the skin once a week. 04/09/18   Fontaine, Belinda Block, MD  traMADol (ULTRAM) 50 MG tablet Take 1 tablet (50 mg total) by mouth every 6 (six) hours as needed. 06/23/18   Lorin Glass, PA-C    Family History Family History  Problem Relation Age of Onset  . Hypertension Mother   . Heart disease Mother 84       CHF  . Diabetes Mother   . Sickle cell anemia Brother   . Hypertension Maternal Grandfather   . Diabetes Maternal Grandfather   . Heart disease Maternal Grandfather   . Diabetes Maternal Grandmother   . Heart disease Maternal Grandmother     Social History Social History   Tobacco Use  . Smoking status: Former Smoker    Packs/day: 0.50    Years: 2.00    Pack years: 1.00    Last attempt to quit: 09/08/2013  Years since quitting: 4.9  . Smokeless tobacco: Never Used  Substance Use Topics  . Alcohol use: No    Alcohol/week: 0.0 standard drinks  . Drug use: No     Allergies   Iodides; Morphine and related; Shellfish allergy; and Ultram [tramadol hcl]   Review of Systems Review of Systems  Constitutional: Positive for chills. Negative for fever.  Respiratory: Negative for shortness of breath.   Cardiovascular: Negative for chest pain.  Gastrointestinal: Positive for abdominal pain, diarrhea, nausea and vomiting. Negative for blood in stool.  Genitourinary: Negative for dysuria, flank pain, menstrual problem and vaginal bleeding.  All other systems reviewed and are negative.    Physical Exam Updated Vital Signs BP (!) 147/94   Pulse (!) 101   Temp 99.2 F (37.3 C) (Oral)   Resp 18   Ht '5\' 3"'$  (1.6 m)   Wt 89.8 kg   LMP 08/10/2018   SpO2 99%   BMI 35.07 kg/m    Physical Exam Vitals signs and nursing note reviewed.  Constitutional:      General: She is not in acute distress.    Appearance: She is well-developed.     Comments: Calm and cooperative. Fatigued appearing. Dry mucous membranes  HENT:     Head: Normocephalic and atraumatic.  Eyes:     General: No scleral icterus.       Right eye: No discharge.        Left eye: No discharge.     Conjunctiva/sclera: Conjunctivae normal.     Pupils: Pupils are equal, round, and reactive to light.  Neck:     Musculoskeletal: Normal range of motion.  Cardiovascular:     Rate and Rhythm: Tachycardia present.     Heart sounds: No murmur. No gallop.   Pulmonary:     Effort: Pulmonary effort is normal. No respiratory distress.     Breath sounds: Normal breath sounds.  Abdominal:     General: Abdomen is protuberant. Bowel sounds are normal. There is no distension.     Palpations: Abdomen is soft.     Tenderness: There is generalized abdominal tenderness.     Hernia: No hernia is present.  Skin:    General: Skin is warm and dry.  Neurological:     Mental Status: She is alert and oriented to person, place, and time.  Psychiatric:        Behavior: Behavior normal.      ED Treatments / Results  Labs (all labs ordered are listed, but only abnormal results are displayed) Labs Reviewed  CBC WITH DIFFERENTIAL/PLATELET - Abnormal; Notable for the following components:      Result Value   Lymphs Abs 0.4 (*)    All other components within normal limits  COMPREHENSIVE METABOLIC PANEL - Abnormal; Notable for the following components:   Glucose, Bld 323 (*)    All other components within normal limits  URINALYSIS, ROUTINE W REFLEX MICROSCOPIC - Abnormal; Notable for the following components:   APPearance HAZY (*)    Specific Gravity, Urine 1.038 (*)    Glucose, UA >=500 (*)    Hgb urine dipstick LARGE (*)    Ketones, ur 80 (*)    Leukocytes, UA LARGE (*)    WBC, UA >50 (*)    All other  components within normal limits  CBG MONITORING, ED - Abnormal; Notable for the following components:   Glucose-Capillary 303 (*)    All other components within normal limits  CBG MONITORING, ED - Abnormal;  Notable for the following components:   Glucose-Capillary 277 (*)    All other components within normal limits  LIPASE, BLOOD  PREGNANCY, URINE    EKG None  Radiology No results found.  Procedures Procedures (including critical care time)  Medications Ordered in ED Medications  sodium chloride 0.9 % bolus 1,000 mL (0 mLs Intravenous Stopped 08/15/18 1257)  ketorolac (TORADOL) 15 MG/ML injection 15 mg (15 mg Intravenous Given 08/15/18 1157)  metoCLOPramide (REGLAN) injection 10 mg (10 mg Intravenous Given 08/15/18 1157)  sodium chloride 0.9 % bolus 1,000 mL (1,000 mLs Intravenous New Bag/Given 08/15/18 1354)     Initial Impression / Assessment and Plan / ED Course  I have reviewed the triage vital signs and the nursing notes.  Pertinent labs & imaging results that were available during my care of the patient were reviewed by me and considered in my medical decision making (see chart for details).  27 year old female presents with generalized abdominal pain, N/V/D for the past couple days. She is mildly hypertensive and tachycardic but otherwise vitals are normal. She has mild generalized tenderness on exam. Will give fluids, reglan, toradol and obtain labs, UA and reassess.  CBC is normal. CMP is remarkable for hyperglycemia (323). Ua has >500 glucose, large leukocytes, 80 ketones, high specific gravity, and >50 WBC. She denies urinary symptoms. Will defer tx at this time for UTI.   After 2L fluids she is feeling better. She tolerated PO. Repeat CBG is 277. Will d/c with [facility] and wellness f/u.  Final Clinical Impressions(s) / ED Diagnoses   Final diagnoses:  Nausea vomiting and diarrhea    ED Discharge Orders    None       Recardo Evangelist, PA-C 08/15/18  1601    Hayden Rasmussen, MD 08/16/18 [phone]

## 2018-08-15 NOTE — Discharge Planning (Signed)
[facility]  [facility] spoke with PA and [facility]

## 2018-08-15 NOTE — Discharge Instructions (Signed)
Please rest and drink plenty of fluids Take Zofran for nausea Take Bentyl for abdominal pain and spasms Follow up with [facility] Return if worsening

## 2018-09-05 ENCOUNTER — Ambulatory Visit: Payer: Self-pay | Admitting: Gynecology

## 2018-10-08 ENCOUNTER — Ambulatory Visit (HOSPITAL_COMMUNITY)
Admission: EM | Admit: 2018-10-08 | Discharge: 2018-10-08 | Disposition: A | Discharge status: Home / Self Care | Payer: BLUE CROSS/BLUE SHIELD | Attending: Emergency Medicine | Admitting: Emergency Medicine

## 2018-10-08 ENCOUNTER — Other Ambulatory Visit: Payer: Self-pay

## 2018-10-08 ENCOUNTER — Encounter (HOSPITAL_COMMUNITY): Payer: Self-pay | Admitting: Emergency Medicine

## 2018-10-08 DIAGNOSIS — B349 Viral infection, unspecified: Secondary | ICD-10-CM | POA: Diagnosis not present

## 2018-10-08 MED ORDER — FLUTICASONE PROPIONATE 50 MCG/ACT NA SUSP: 2.0000 | Freq: Every day | NASAL | 0 refills | Status: DC

## 2018-10-08 MED ORDER — IPRATROPIUM BROMIDE 0.06 % NA SOLN: 2.0000 | Freq: Four times a day (QID) | NASAL | 0 refills | Status: DC

## 2018-10-08 MED ORDER — NAPROXEN 500 MG PO TABS: 500.0000 mg | ORAL_TABLET | Freq: Two times a day (BID) | ORAL | 0 refills | Status: AC

## 2018-10-08 NOTE — ED Triage Notes (Signed)
Woke up with headache, sore throat, runny nose, cough.

## 2018-10-08 NOTE — Discharge Instructions (Signed)
You can take naproxen for your headache. Start flonase, atrovent nasal spray for nasal congestion/drainage. You can use over the counter nasal saline rinse such as neti pot for nasal congestion. Keep hydrated, your urine should be clear to pale yellow in color. Tylenol/motrin for fever and pain. Monitor for any worsening of symptoms, chest pain, shortness of breath, wheezing, swelling of the throat, follow up for reevaluation.   For sore throat/cough try using a honey-based tea. Use 3 teaspoons of honey with juice squeezed from half lemon. Place shaved pieces of ginger into 1/2-1 cup of water and warm over stove top. Then mix the ingredients and repeat every 4 hours as needed.

## 2018-10-08 NOTE — ED Provider Notes (Signed)
[facility]    CSN: [id number] Arrival date & time: 10/08/18  1217     History   Chief Complaint Chief Complaint  Patient presents with  . URI    HPI Misty [name] is a 28 y.o. female.   28 year old female comes in for 1 day history of URI symptoms.  States she woke up with rhinorrhea, nasal congestion, cough, frontal headache, sore throat.  She denies fever, chills, night sweats.  Has still been eating and drinking without difficulty.  She has not taken anything for the symptoms.  Former smoker.  Positive sick contact.     Past Medical History:  Diagnosis Date  . Allergy   . Anemia 2015  . Asthma    as child  . Diabetes mellitus without complication ([place]) 4270  . Eczema   . Hypertension 09/2013    There are no active problems to display for this patient.   Past Surgical History:  Procedure Laterality Date  . Abcess on back      OB History    Gravida  1   Para  0   Term  0   Preterm  0   AB  1   Living  0     SAB  1   TAB  0   Ectopic  0   Multiple  0   Live Births               Home Medications    Prior to Admission medications   Medication Sig Start Date End Date Taking? Authorizing Provider  amLODipine (NORVASC) 5 MG tablet Take 1 tablet (5 mg total) by mouth daily. 08/15/18  Yes Recardo Evangelist, PA-C  metFORMIN (GLUCOPHAGE) 500 MG tablet Take 2 tablets (1,000 mg total) by mouth daily with breakfast. 03/21/18  Yes Antonietta Breach, PA-C  norelgestromin-ethinyl estradiol (ORTHO EVRA) 150-35 MCG/24HR transdermal patch Place 1 patch onto the skin once a week. 04/09/18  Yes Fontaine, Belinda Block, MD  blood glucose meter kit and supplies KIT Dispense based on patient and insurance preference. Use up to four times daily as directed. (FOR ICD-9 250.00, 250.01). 01/10/16   Hedges, Dellis Filbert, PA-C  dicyclomine (BENTYL) 20 MG tablet Take 1 tablet (20 mg total) by mouth 2 (two) times daily. 08/15/18   Recardo Evangelist, PA-C  fluticasone  (FLONASE) 50 MCG/ACT nasal spray Place 2 sprays into both nostrils daily. 10/08/18   Tasia Catchings, Pedram Goodchild V, PA-C  ipratropium (ATROVENT) 0.06 % nasal spray Place 2 sprays into both nostrils 4 (four) times daily. 10/08/18   Tasia Catchings, Lorri Fukuhara V, PA-C  naproxen (NAPROSYN) 500 MG tablet Take 1 tablet (500 mg total) by mouth 2 (two) times daily for 10 days. 10/08/18 10/18/18  Ok Edwards, PA-C  ondansetron (ZOFRAN ODT) 4 MG disintegrating tablet Take 1 tablet (4 mg total) by mouth every 8 (eight) hours as needed for nausea or vomiting. 08/15/18   Recardo Evangelist, PA-C    Family History Family History  Problem Relation Age of Onset  . Hypertension Mother   . Heart disease Mother 27       CHF  . Diabetes Mother   . Sickle cell anemia Brother   . Hypertension Maternal Grandfather   . Diabetes Maternal Grandfather   . Heart disease Maternal Grandfather   . Diabetes Maternal Grandmother   . Heart disease Maternal Grandmother     Social History Social History   Tobacco Use  . Smoking status: Former Smoker  Packs/day: 0.50    Years: 2.00    Pack years: 1.00    Last attempt to quit: 09/08/2013    Years since quitting: 5.0  . Smokeless tobacco: Never Used  Substance Use Topics  . Alcohol use: No    Alcohol/week: 0.0 standard drinks  . Drug use: No     Allergies   Iodides; Morphine and related; Shellfish allergy; and Ultram [tramadol hcl]   Review of Systems Review of Systems  Reason unable to perform ROS: See HPI as above.     Physical Exam Triage Vital Signs ED Triage Vitals  Enc Vitals Group     BP 10/08/18 1325 (!) 146/88     Pulse Rate 10/08/18 1325 70     Resp 10/08/18 1325 20     Temp 10/08/18 1325 98.5 F (36.9 C)     Temp Source 10/08/18 1325 Oral     SpO2 10/08/18 1325 100 %     Weight --      Height --      Head Circumference --      Peak Flow --      Pain Score 10/08/18 1322 8     Pain Loc --      Pain Edu? --      Excl. in [place]? --    No data found.  Updated Vital Signs BP (!)  146/88 (BP Location: Right Arm)   Pulse 70   Temp 98.5 F (36.9 C) (Oral)   Resp 20   LMP 09/29/2018   SpO2 100%   Physical Exam Vitals signs and nursing note reviewed.  Constitutional:      General: She is not in acute distress.    Appearance: Normal appearance. She is well-developed. She is not ill-appearing, toxic-appearing or diaphoretic.  HENT:     Head: Normocephalic and atraumatic.     Right Ear: Tympanic membrane, ear canal and external ear normal. Tympanic membrane is not erythematous or bulging.     Left Ear: Tympanic membrane, ear canal and external ear normal. Tympanic membrane is not erythematous or bulging.     Nose: Nose normal. No congestion or rhinorrhea.     Right Sinus: No maxillary sinus tenderness or frontal sinus tenderness.     Left Sinus: No maxillary sinus tenderness or frontal sinus tenderness.     Mouth/Throat:     Mouth: Mucous membranes are moist.     Pharynx: Oropharynx is clear. Uvula midline. No posterior oropharyngeal erythema.  Eyes:     Conjunctiva/sclera: Conjunctivae normal.     Pupils: Pupils are equal, round, and reactive to light.  Neck:     Musculoskeletal: Normal range of motion and neck supple.  Cardiovascular:     Rate and Rhythm: Normal rate and regular rhythm.     Heart sounds: Normal heart sounds. No murmur. No friction rub. No gallop.   Pulmonary:     Effort: Pulmonary effort is normal.     Breath sounds: Normal breath sounds. No decreased breath sounds, wheezing, rhonchi or rales.  Lymphadenopathy:     Cervical: No cervical adenopathy.  Skin:    General: Skin is warm and dry.  Neurological:     Mental Status: She is alert and oriented to person, place, and time.  Psychiatric:        Behavior: Behavior normal.        Judgment: Judgment normal.      UC Treatments / Results  Labs (all labs ordered are listed, but only abnormal  results are displayed) Labs Reviewed - No data to display  EKG None  Radiology No results  found.  Procedures Procedures (including critical care time)  Medications Ordered in UC Medications - No data to display  Initial Impression / Assessment and Plan / UC Course  I have reviewed the triage vital signs and the nursing notes.  Pertinent labs & imaging results that were available during my care of the patient were reviewed by me and considered in my medical decision making (see chart for details).    Discussed with patient history and exam most consistent with viral illness.  Exam reassuring without any alarming signs.  Will provide symptomatic treatment.  Patient requesting hydrocodone for headache. She is sitting comfortably, without tachycardia, tachypnea, without any distress. Discussed with patient, current symptoms does not warrant hydrocodone. Patient also with allergy to tramadol and morphine, discussed patient I am uncomfortable with filling narcotics. Will provide naproxen for symptomatic treatment. Return precautions given.  Final Clinical Impressions(s) / UC Diagnoses   Final diagnoses:  Viral illness    ED Prescriptions    Medication Sig Dispense Auth. Provider   fluticasone (FLONASE) 50 MCG/ACT nasal spray Place 2 sprays into both nostrils daily. 1 g Adalie Mand V, PA-C   ipratropium (ATROVENT) 0.06 % nasal spray Place 2 sprays into both nostrils 4 (four) times daily. 15 mL Moosa Bueche V, PA-C   naproxen (NAPROSYN) 500 MG tablet Take 1 tablet (500 mg total) by mouth 2 (two) times daily for 10 days. 20 tablet Tobin Chad, [place] 10/08/18 1435

## 2018-10-30 ENCOUNTER — Other Ambulatory Visit: Payer: Self-pay | Admitting: Gynecology

## 2018-11-03 ENCOUNTER — Telehealth: Payer: Self-pay | Admitting: *Deleted

## 2018-11-03 NOTE — Telephone Encounter (Signed)
Patient called requesting refill on Xulane 150mg - patch I will send in Rx, pharmacy did not received refill from 10/30/18.  Dr. Audie Box patient asked if she can have Rx for ibuprofen to help with cramps during cycles?

## 2018-11-04 MED ORDER — IBUPROFEN 800 MG PO TABS: 800.0000 mg | ORAL_TABLET | Freq: Three times a day (TID) | ORAL | 3 refills | Status: DC | PRN

## 2018-11-04 MED ORDER — NORELGESTROMIN-ETH ESTRADIOL 150-35 MCG/24HR TD PTWK: MEDICATED_PATCH | TRANSDERMAL | 1 refills | Status: DC

## 2018-11-04 NOTE — Telephone Encounter (Signed)
Patient informed, Rx sent.  

## 2018-11-04 NOTE — Telephone Encounter (Signed)
Okay for ibuprofen 800 mg every 8 hour as needed cramping #30 with 3 refills

## 2019-01-07 ENCOUNTER — Other Ambulatory Visit: Payer: Self-pay | Admitting: Gynecology

## 2019-01-07 NOTE — Telephone Encounter (Signed)
Annual scheduled on 02/13/19

## 2019-01-14 ENCOUNTER — Emergency Department (HOSPITAL_COMMUNITY)
Admission: EM | Admit: 2019-01-14 | Discharge: 2019-01-14 | Disposition: A | Discharge status: Home / Self Care | Payer: BLUE CROSS/BLUE SHIELD | Attending: Emergency Medicine | Admitting: Emergency Medicine

## 2019-01-14 ENCOUNTER — Other Ambulatory Visit: Payer: Self-pay

## 2019-01-14 ENCOUNTER — Encounter (HOSPITAL_COMMUNITY): Payer: Self-pay

## 2019-01-14 DIAGNOSIS — R110 Nausea: Secondary | ICD-10-CM | POA: Insufficient documentation

## 2019-01-14 DIAGNOSIS — Z87891 Personal history of nicotine dependence: Secondary | ICD-10-CM | POA: Insufficient documentation

## 2019-01-14 DIAGNOSIS — E1165 Type 2 diabetes mellitus with hyperglycemia: Secondary | ICD-10-CM | POA: Insufficient documentation

## 2019-01-14 DIAGNOSIS — Z7984 Long term (current) use of oral hypoglycemic drugs: Secondary | ICD-10-CM | POA: Diagnosis not present

## 2019-01-14 DIAGNOSIS — I10 Essential (primary) hypertension: Secondary | ICD-10-CM | POA: Diagnosis not present

## 2019-01-14 DIAGNOSIS — R1084 Generalized abdominal pain: Secondary | ICD-10-CM | POA: Insufficient documentation

## 2019-01-14 DIAGNOSIS — R739 Hyperglycemia, unspecified: Secondary | ICD-10-CM

## 2019-01-14 LAB — URINALYSIS, ROUTINE W REFLEX MICROSCOPIC
Bacteria, UA: NONE SEEN
Bilirubin Urine: NEGATIVE
Glucose, UA: >=500 mg/dL — AB
Hgb urine dipstick: NEGATIVE
Ketones, ur: 20 mg/dL — AB
Leukocytes,Ua: NEGATIVE
Nitrite: NEGATIVE
Protein, ur: NEGATIVE mg/dL
Specific Gravity, Urine: 1.036 — ABNORMAL HIGH (ref 1.005–1.030)
pH: 6 (ref 5.0–8.0)

## 2019-01-14 LAB — COMPREHENSIVE METABOLIC PANEL
ALT: 22 U/L (ref 0–44)
AST: 19 U/L (ref 15–41)
Albumin: 3.4 g/dL — ABNORMAL LOW (ref 3.5–5.0)
Alkaline Phosphatase: 72 U/L (ref 38–126)
Anion gap: 13 (ref 5–15)
BUN: 8 mg/dL (ref 6–20)
CO2: 24 mmol/L (ref 22–32)
Calcium: 9.4 mg/dL (ref 8.9–10.3)
Chloride: 95 mmol/L — ABNORMAL LOW (ref 98–111)
Creatinine, Ser: 0.77 mg/dL (ref 0.44–1.00)
GFR calc Af Amer: >60 mL/min (ref >60)
GFR calc non Af Amer: >60 mL/min (ref >60)
Glucose, Bld: 377 mg/dL — ABNORMAL HIGH (ref 70–99)
Potassium: 3.9 mmol/L (ref 3.5–5.1)
Sodium: 132 mmol/L — ABNORMAL LOW (ref 135–145)
Total Bilirubin: 0.3 mg/dL (ref 0.3–1.2)
Total Protein: 7.1 g/dL (ref 6.5–8.1)

## 2019-01-14 LAB — CBC
HCT: 39.4 % (ref 36.0–46.0)
Hemoglobin: 13.4 g/dL (ref 12.0–15.0)
MCH: 30.9 pg (ref 26.0–34.0)
MCHC: 34 g/dL (ref 30.0–36.0)
MCV: 91 fL (ref 80.0–100.0)
Platelets: 348 K/uL (ref 150–400)
RBC: 4.33 MIL/uL (ref 3.87–5.11)
RDW: 11.8 % (ref 11.5–15.5)
WBC: 7.5 K/uL (ref 4.0–10.5)
nRBC: 0 % (ref 0.0–0.2)

## 2019-01-14 LAB — LIPASE, BLOOD: Lipase: 31 U/L (ref 11–51)

## 2019-01-14 LAB — PREGNANCY, URINE: Preg Test, Ur: NEGATIVE

## 2019-01-14 LAB — CBG MONITORING, ED: Glucose-Capillary: 368 mg/dL — ABNORMAL HIGH (ref 70–99)

## 2019-01-14 MED ORDER — AMLODIPINE BESYLATE 5 MG PO TABS: 5.0000 mg | ORAL_TABLET | Freq: Every day | ORAL | 1 refills | Status: DC

## 2019-01-14 MED ORDER — SODIUM CHLORIDE 0.9 % IV BOLUS
1000.0000 mL | Freq: Once | INTRAVENOUS | Status: AC
  Administered 2019-01-14: 1000 mL via INTRAVENOUS

## 2019-01-14 MED ORDER — LIDOCAINE VISCOUS HCL 2 % MT SOLN
15.0000 mL | Freq: Once | OROMUCOSAL | Status: AC
  Administered 2019-01-14: 15 mL via ORAL
  Filled 2019-01-14: qty 15

## 2019-01-14 MED ORDER — ONDANSETRON HCL 4 MG/2ML IJ SOLN
4.0000 mg | Freq: Once | INTRAMUSCULAR | Status: AC
  Administered 2019-01-14: 4 mg via INTRAVENOUS
  Filled 2019-01-14: qty 2

## 2019-01-14 MED ORDER — IPRATROPIUM BROMIDE 0.06 % NA SOLN: 2.0000 | Freq: Four times a day (QID) | NASAL | 1 refills | Status: DC

## 2019-01-14 MED ORDER — FLUTICASONE PROPIONATE 50 MCG/ACT NA SUSP: 2.0000 | Freq: Every day | NASAL | 1 refills | Status: DC

## 2019-01-14 MED ORDER — NAPROXEN 500 MG PO TABS: 500.0000 mg | ORAL_TABLET | Freq: Two times a day (BID) | ORAL | 0 refills | Status: DC

## 2019-01-14 MED ORDER — METFORMIN HCL 500 MG PO TABS: 1000.0000 mg | ORAL_TABLET | Freq: Every day | ORAL | 1 refills | Status: DC

## 2019-01-14 MED ORDER — ONDANSETRON 4 MG PO TBDP: 4.0000 mg | ORAL_TABLET | Freq: Three times a day (TID) | ORAL | 0 refills | Status: DC | PRN

## 2019-01-14 MED ORDER — ALUM & MAG HYDROXIDE-SIMETH 200-200-20 MG/5ML PO SUSP
30.0000 mL | Freq: Once | ORAL | Status: AC
  Administered 2019-01-14: 30 mL via ORAL
  Filled 2019-01-14: qty 30

## 2019-01-14 MED ORDER — OMEPRAZOLE 20 MG PO CPDR: 20.0000 mg | DELAYED_RELEASE_CAPSULE | Freq: Every day | ORAL | 0 refills | Status: DC

## 2019-01-14 NOTE — ED Notes (Signed)
Pt able to tolerate [place] sandwich and ginger ale well; pt reports abdominal pain has not decreased. MD notified.

## 2019-01-14 NOTE — ED Provider Notes (Addendum)
On repeat evaluation patient is tolerating p.o.'s but is still having some epigastric discomfort.  She has no peritoneal signs or concerns for hepatitis, pancreatitis, appendicitis or obstruction.  Could be gastritis in nature.  Patient was given a GI cocktail.  Patient's abdominal pain is improved after GI cocktail.  She was given a 2 week course of PPI. Her blood sugar has only slightly improved from 3 77-368 but she feels generally better.  She also states she is out of all of her medications and prescriptions were given.   Gwyneth Sprout, MD 01/14/19 2924    Gwyneth Sprout, MD 01/14/19 1710

## 2019-01-14 NOTE — ED Triage Notes (Signed)
Pt arrives with [facility] from home c/o nausea and abdominal pain that started this morning. Pt denies fever and vomitting. Pt has hx of DM and HTN. Pt is a&o x4.

## 2019-01-14 NOTE — ED Provider Notes (Signed)
[facility] EMERGENCY DEPARTMENT Provider Note   CSN: [id number] Arrival date & time: 01/14/19  1216    History   Chief Complaint Chief Complaint  Patient presents with  . Nausea  . Abdominal Pain    HPI Misty [name] is a 28 y.o. female.     HPI Patient presents with abdominal pain and nausea that began around 2 in the morning.  No fever.  No vomiting.  No diarrhea.  Pain is diffuse over her abdomen.  No sick contacts.  States she has not thrown up but feels as if she could.  She is diabetic and states her sugar was 300 for EMS.  Denies possibly a pregnancy.  Pain is dull. Past Medical History:  Diagnosis Date  . Allergy   . Anemia 2015  . Asthma    as child  . Diabetes mellitus without complication ([place]) 6761  . Eczema   . Hypertension 09/2013    There are no active problems to display for this patient.   Past Surgical History:  Procedure Laterality Date  . Abcess on back       OB History    Gravida  1   Para  0   Term  0   Preterm  0   AB  1   Living  0     SAB  1   TAB  0   Ectopic  0   Multiple  0   Live Births               Home Medications    Prior to Admission medications   Medication Sig Start Date End Date Taking? Authorizing Provider  amLODipine (NORVASC) 5 MG tablet Take 1 tablet (5 mg total) by mouth daily. 08/15/18   Recardo Evangelist, PA-C  blood glucose meter kit and supplies KIT Dispense based on patient and insurance preference. Use up to four times daily as directed. (FOR ICD-9 250.00, 250.01). 01/10/16   Hedges, Dellis Filbert, PA-C  dicyclomine (BENTYL) 20 MG tablet Take 1 tablet (20 mg total) by mouth 2 (two) times daily. 08/15/18   Recardo Evangelist, PA-C  fluticasone (FLONASE) 50 MCG/ACT nasal spray Place 2 sprays into both nostrils daily. 10/08/18   Tasia Catchings, Amy V, PA-C  ibuprofen (ADVIL,MOTRIN) 800 MG tablet Take 1 tablet (800 mg total) by mouth every 8 (eight) hours as needed. 11/04/18   Fontaine, Belinda Block, MD  ipratropium (ATROVENT) 0.06 % nasal spray Place 2 sprays into both nostrils 4 (four) times daily. 10/08/18   Tasia Catchings, Amy V, PA-C  metFORMIN (GLUCOPHAGE) 500 MG tablet Take 2 tablets (1,000 mg total) by mouth daily with breakfast. 03/21/18   Antonietta Breach, PA-C  ondansetron (ZOFRAN-ODT) 4 MG disintegrating tablet Take 1 tablet (4 mg total) by mouth every 8 (eight) hours as needed for nausea or vomiting. 01/14/19   Davonna Belling, MD  Marilu Favre 150-35 MCG/24HR transdermal patch apply 1 PATCH onto THE SKIN ONCE A WEEK 01/07/19   Fontaine, Belinda Block, MD    Family History Family History  Problem Relation Age of Onset  . Hypertension Mother   . Heart disease Mother 54       CHF  . Diabetes Mother   . Sickle cell anemia Brother   . Hypertension Maternal Grandfather   . Diabetes Maternal Grandfather   . Heart disease Maternal Grandfather   . Diabetes Maternal Grandmother   . Heart disease Maternal Grandmother     Social History Social History  Tobacco Use  . Smoking status: Former Smoker    Packs/day: 0.50    Years: 2.00    Pack years: 1.00    Last attempt to quit: 09/08/2013    Years since quitting: 5.3  . Smokeless tobacco: Never Used  Substance Use Topics  . Alcohol use: No    Alcohol/week: 0.0 standard drinks  . Drug use: No     Allergies   Iodides; Morphine and related; Shellfish allergy; and Ultram [tramadol hcl]   Review of Systems Review of Systems  Constitutional: Positive for appetite change. Negative for chills and fever.  HENT: Negative for congestion.   Cardiovascular: Negative for chest pain.  Gastrointestinal: Positive for abdominal pain and nausea. Negative for vomiting.  Genitourinary: Negative for flank pain.  Musculoskeletal: Negative for back pain.  Skin: Negative for rash.  Neurological: Negative for weakness.  Psychiatric/Behavioral: Negative for confusion.     Physical Exam Updated Vital Signs BP 125/79 (BP Location: Left Arm)   Pulse 81   Temp  98.8 F (37.1 C) (Oral)   Resp 18   Ht 5' 3.5" (1.613 m)   Wt 90.7 kg   LMP 01/05/2019 (Exact Date)   SpO2 97%   BMI 34.87 kg/m   Physical Exam Vitals signs and nursing note reviewed.  HENT:     Head: Normocephalic.  Cardiovascular:     Rate and Rhythm: Regular rhythm.  Pulmonary:     Breath sounds: Normal breath sounds.  Abdominal:     Hernia: No hernia is present.     Comments: Minimal diffuse tenderness without localizing tenderness.  No hernia.  Skin:    General: Skin is warm.  Neurological:     General: No focal deficit present.     Mental Status: She is alert.      ED Treatments / Results  Labs (all labs ordered are listed, but only abnormal results are displayed) Labs Reviewed  COMPREHENSIVE METABOLIC PANEL - Abnormal; Notable for the following components:      Result Value   Sodium 132 (*)    Chloride 95 (*)    Glucose, Bld 377 (*)    Albumin 3.4 (*)    All other components within normal limits  URINALYSIS, ROUTINE W REFLEX MICROSCOPIC - Abnormal; Notable for the following components:   Specific Gravity, Urine 1.036 (*)    Glucose, UA >=500 (*)    Ketones, ur 20 (*)    All other components within normal limits  LIPASE, BLOOD  CBC  PREGNANCY, URINE  CBG MONITORING, ED    EKG None  Radiology No results found.  Procedures Procedures (including critical care time)  Medications Ordered in ED Medications  sodium chloride 0.9 % bolus 1,000 mL (0 mLs Intravenous Stopped 01/14/19 1402)  ondansetron (ZOFRAN) injection 4 mg (4 mg Intravenous Given 01/14/19 1259)  sodium chloride 0.9 % bolus 1,000 mL (1,000 mLs Intravenous New Bag/Given 01/14/19 1402)     Initial Impression / Assessment and Plan / ED Course  I have reviewed the triage vital signs and the nursing notes.  Pertinent labs & imaging results that were available during my care of the patient were reviewed by me and considered in my medical decision making (see chart for details).         Patient with nausea upper abdominal pain.  Hyperglycemia.  Not in DKA.  She is diabetic.  2 L of fluid given.  Will recheck sugar.  Given oral trial.  Hopefully discharge home.  Labs reassuring and  not pregnant.  Care turned over to Dr. Maryan Rued.  Final Clinical Impressions(s) / ED Diagnoses   Final diagnoses:  Nausea  Generalized abdominal pain  Hyperglycemia    ED Discharge Orders         Ordered    ondansetron (ZOFRAN-ODT) 4 MG disintegrating tablet  Every 8 hours PRN     01/14/19 1445           Davonna Belling, MD 01/14/19 1445

## 2019-01-14 NOTE — ED Notes (Signed)
ED Provider at bedside. 

## 2019-01-14 NOTE — ED Notes (Signed)
This RN helped pt ambulate to restroom.

## 2019-02-11 ENCOUNTER — Telehealth: Payer: Self-pay | Admitting: *Deleted

## 2019-02-11 MED ORDER — NORELGESTROMIN-ETH ESTRADIOL 150-35 MCG/24HR TD PTWK: MEDICATED_PATCH | TRANSDERMAL | 0 refills | Status: DC

## 2019-02-11 NOTE — Telephone Encounter (Signed)
Patient called requesting refill on Xulane patch, I called pharmacy to see if she had refills. Pharmacy said Rx last filled on 01/23/19, patient is due for refill. Rx sent, annual scheduled 03/25/19

## 2019-02-13 ENCOUNTER — Encounter: Payer: Self-pay | Admitting: Gynecology

## 2019-02-25 ENCOUNTER — Telehealth: Payer: Self-pay | Admitting: *Deleted

## 2019-02-25 MED ORDER — NAPROXEN 500 MG PO TABS: 500.0000 mg | ORAL_TABLET | Freq: Two times a day (BID) | ORAL | 3 refills | Status: DC | PRN

## 2019-02-25 NOTE — Telephone Encounter (Signed)
Okay #3o, 1 p.o. every 12 hours as needed pain refill x3

## 2019-02-25 NOTE — Telephone Encounter (Signed)
Patient has annual exam scheduled on 03/25/19 asked if you would be willing to prescribed Naproxen 500 mg tablets to help with cramps during cycle? Patient said this helps a lot. Please advise

## 2019-02-25 NOTE — Telephone Encounter (Signed)
Rx sent 

## 2019-03-13 ENCOUNTER — Telehealth: Payer: Self-pay | Admitting: *Deleted

## 2019-03-13 MED ORDER — XULANE 150-35 MCG/24HR TD PTWK: MEDICATED_PATCH | TRANSDERMAL | 1 refills | Status: DC

## 2019-03-13 NOTE — Telephone Encounter (Signed)
Patient called requesting refill on Xulane Patch, annual rescheduled to 04/29/19

## 2019-03-25 ENCOUNTER — Encounter: Payer: Self-pay | Admitting: Gynecology

## 2019-04-09 ENCOUNTER — Other Ambulatory Visit: Payer: Self-pay | Admitting: Gynecology

## 2019-04-17 ENCOUNTER — Other Ambulatory Visit: Payer: Self-pay | Admitting: Gynecology

## 2019-04-17 NOTE — Telephone Encounter (Signed)
annual exam scheduled on 05/28/19

## 2019-04-29 ENCOUNTER — Encounter: Payer: BLUE CROSS/BLUE SHIELD | Admitting: Gynecology

## 2019-05-05 ENCOUNTER — Encounter (HOSPITAL_COMMUNITY): Payer: Self-pay

## 2019-05-05 ENCOUNTER — Emergency Department (HOSPITAL_COMMUNITY): Payer: BLUE CROSS/BLUE SHIELD

## 2019-05-05 ENCOUNTER — Other Ambulatory Visit: Payer: Self-pay

## 2019-05-05 ENCOUNTER — Emergency Department (HOSPITAL_COMMUNITY)
Admission: EM | Admit: 2019-05-05 | Discharge: 2019-05-05 | Disposition: A | Discharge status: Home / Self Care | Payer: BLUE CROSS/BLUE SHIELD | Attending: Emergency Medicine | Admitting: Emergency Medicine

## 2019-05-05 DIAGNOSIS — R591 Generalized enlarged lymph nodes: Secondary | ICD-10-CM | POA: Diagnosis not present

## 2019-05-05 DIAGNOSIS — R238 Other skin changes: Secondary | ICD-10-CM

## 2019-05-05 DIAGNOSIS — J45909 Unspecified asthma, uncomplicated: Secondary | ICD-10-CM | POA: Diagnosis not present

## 2019-05-05 DIAGNOSIS — E119 Type 2 diabetes mellitus without complications: Secondary | ICD-10-CM | POA: Insufficient documentation

## 2019-05-05 DIAGNOSIS — I10 Essential (primary) hypertension: Secondary | ICD-10-CM | POA: Diagnosis not present

## 2019-05-05 DIAGNOSIS — Z7984 Long term (current) use of oral hypoglycemic drugs: Secondary | ICD-10-CM | POA: Diagnosis not present

## 2019-05-05 DIAGNOSIS — Z79899 Other long term (current) drug therapy: Secondary | ICD-10-CM | POA: Diagnosis not present

## 2019-05-05 DIAGNOSIS — Z87891 Personal history of nicotine dependence: Secondary | ICD-10-CM | POA: Diagnosis not present

## 2019-05-05 DIAGNOSIS — Z20828 Contact with and (suspected) exposure to other viral communicable diseases: Secondary | ICD-10-CM | POA: Insufficient documentation

## 2019-05-05 DIAGNOSIS — R220 Localized swelling, mass and lump, head: Secondary | ICD-10-CM | POA: Diagnosis present

## 2019-05-05 MED ORDER — TRAMADOL HCL 50 MG PO TABS
50.0000 mg | ORAL_TABLET | Freq: Once | ORAL | Status: AC
  Administered 2019-05-05: 50 mg via ORAL
  Filled 2019-05-05: qty 1

## 2019-05-05 MED ORDER — AMLODIPINE BESYLATE 5 MG PO TABS: 5.0000 mg | ORAL_TABLET | Freq: Every day | ORAL | 0 refills | Status: DC

## 2019-05-05 NOTE — ED Notes (Signed)
States  Has a phsical tom at PCP

## 2019-05-05 NOTE — ED Notes (Signed)
Patient transported to X-ray 

## 2019-05-05 NOTE — ED Provider Notes (Signed)
[facility] EMERGENCY DEPARTMENT Provider Note   CSN: [id number] Arrival date & time: 05/05/19  0941    History   Chief Complaint Chief Complaint  Patient presents with  . Abscess    HPI Misty [name] is a 28 y.o. female.     HPI   28 year old female presents today with several complaints.  Patient notes that approximate 4 days ago she developed a small bump to her right scalp.  She notes she did have her hair done recently.  She notes pain to this area.  She has a history of abscesses.  She also notes around the same time she developed a firm nodule to the area just superior to her clavicle on the right, no other areas of swelling.  She denies any cough fever shortness of breath weight loss night sweats or fevers.  She also reports that she is taking her blood pressure medication but is not taken it in the last several weeks and would like a refill.     Past Medical History:  Diagnosis Date  . Allergy   . Anemia 2015  . Asthma    as child  . Diabetes mellitus without complication ([place]) 8841  . Eczema   . Hypertension 09/2013    There are no active problems to display for this patient.   Past Surgical History:  Procedure Laterality Date  . Abcess on back       OB History    Gravida  1   Para  0   Term  0   Preterm  0   AB  1   Living  0     SAB  1   TAB  0   Ectopic  0   Multiple  0   Live Births              Home Medications    Prior to Admission medications   Medication Sig Start Date End Date Taking? Authorizing Provider  amLODipine (NORVASC) 5 MG tablet Take 1 tablet (5 mg total) by mouth daily. 05/05/19   Caylynn Minchew, Dellis Filbert, PA-C  blood glucose meter kit and supplies KIT Dispense based on patient and insurance preference. Use up to four times daily as directed. (FOR ICD-9 250.00, 250.01). 01/10/16   Tonya Wantz, Dellis Filbert, PA-C  dicyclomine (BENTYL) 20 MG tablet Take 1 tablet (20 mg total) by mouth 2 (two) times daily. 08/15/18    Recardo Evangelist, PA-C  fluticasone (FLONASE) 50 MCG/ACT nasal spray Place 2 sprays into both nostrils daily. 01/14/19   Blanchie Dessert, MD  ibuprofen (ADVIL,MOTRIN) 800 MG tablet Take 1 tablet (800 mg total) by mouth every 8 (eight) hours as needed. 11/04/18   Fontaine, Belinda Block, MD  ipratropium (ATROVENT) 0.06 % nasal spray Place 2 sprays into both nostrils 4 (four) times daily. 01/14/19   Blanchie Dessert, MD  metFORMIN (GLUCOPHAGE) 500 MG tablet Take 2 tablets (1,000 mg total) by mouth daily with breakfast. 01/14/19   Blanchie Dessert, MD  naproxen (NAPROSYN) 500 MG tablet Take 1 tablet (500 mg total) by mouth 2 (two) times daily. Take as needed 2 times a day for your menstrual cramps 01/14/19   Blanchie Dessert, MD  naproxen (NAPROSYN) 500 MG tablet Take 1 tablet (500 mg total) by mouth every 12 (twelve) hours as needed for moderate pain. 02/25/19   Fontaine, Belinda Block, MD  omeprazole (PRILOSEC) 20 MG capsule Take 1 capsule (20 mg total) by mouth daily. 01/14/19   Blanchie Dessert, MD  ondansetron (ZOFRAN-ODT) 4 MG disintegrating tablet Take 1 tablet (4 mg total) by mouth every 8 (eight) hours as needed for nausea or vomiting. 01/14/19   Davonna Belling, MD  Marilu Favre 150-35 MCG/24HR transdermal patch APPLY 1 PATCH ONTO THE SKIN ONCE A WEEK 04/17/19   Fontaine, Belinda Block, MD    Family History Family History  Problem Relation Age of Onset  . Hypertension Mother   . Heart disease Mother 52       CHF  . Diabetes Mother   . Sickle cell anemia Brother   . Hypertension Maternal Grandfather   . Diabetes Maternal Grandfather   . Heart disease Maternal Grandfather   . Diabetes Maternal Grandmother   . Heart disease Maternal Grandmother     Social History Social History   Tobacco Use  . Smoking status: Former Smoker    Packs/day: 0.50    Years: 2.00    Pack years: 1.00    Quit date: 09/08/2013    Years since quitting: 5.6  . Smokeless tobacco: Never Used  Substance Use Topics  .  Alcohol use: No    Alcohol/week: 0.0 standard drinks  . Drug use: No     Allergies   Iodides, Morphine and related, Shellfish allergy, and Ultram [tramadol hcl]   Review of Systems Review of Systems  All other systems reviewed and are negative.   Physical Exam Updated Vital Signs BP (!) 144/101 (BP Location: Left Arm)   Pulse 78   Temp 98.6 F (37 C) (Oral)   Resp 16   SpO2 100%   Physical Exam Vitals signs and nursing note reviewed.  Constitutional:      Appearance: She is well-developed.  HENT:     Head: Normocephalic and atraumatic.     Comments: Small papule to the right scalp no surrounding erythema Eyes:     General: No scleral icterus.       Right eye: No discharge.        Left eye: No discharge.     Conjunctiva/sclera: Conjunctivae normal.     Pupils: Pupils are equal, round, and reactive to light.  Neck:     Musculoskeletal: Normal range of motion.     Vascular: No JVD.     Trachea: No tracheal deviation.     Comments: Single right supraclavicular enlarged lymph node approximately 1 cm-no overlying erythema, nontender-remainder of neck without lymphadenopathy-TM normal Pulmonary:     Effort: Pulmonary effort is normal. No respiratory distress.     Breath sounds: Normal breath sounds. No stridor. No wheezing, rhonchi or rales.  Neurological:     Mental Status: She is alert and oriented to person, place, and time.     Coordination: Coordination normal.  Psychiatric:        Behavior: Behavior normal.        Thought Content: Thought content normal.        Judgment: Judgment normal.      ED Treatments / Results  Labs (all labs ordered are listed, but only abnormal results are displayed) Labs Reviewed  NOVEL CORONAVIRUS, NAA (HOSP ORDER, SEND-OUT TO REF LAB; TAT 18-24 HRS)    EKG None  Radiology Dg Chest 2 View  Result Date: 05/05/2019 CLINICAL DATA:  Lymphadenopathy, chest pain and shortness of EXAM: CHEST - 2 VIEW COMPARISON:  Report for chest  radiograph 10/16/2004 (images currently unavailable) FINDINGS: Heart size and mediastinal contours within normal limits. No focal consolidation within the lungs. No evidence of pleural effusion or pneumothorax. No acute  bony abnormality. A radiopaque circular density projecting over the left lower neck may be external to the patient IMPRESSION: No evidence of active cardiopulmonary disease. Please note CT cross-sectional imaging would have greater sensitivity for thoracic lymphadenopathy. A radiopaque circular density projecting over the left lower neck may be external to the patient. Clinical correlation is recommended. Electronically Signed   By: Kellie Simmering   On: 05/05/2019 13:03    Procedures Procedures (including critical care time)  Medications Ordered in ED Medications  traMADol (ULTRAM) tablet 50 mg (has no administration in time range)     Initial Impression / Assessment and Plan / ED Course  I have reviewed the triage vital signs and the nursing notes.  Pertinent labs & imaging results that were available during my care of the patient were reviewed by me and considered in my medical decision making (see chart for details).        28 year old female presents today with several complaints.  She has a enlarged right supraclavicular lymph node, this is concerning.  She has no respiratory symptoms but I will test her for COVID here.  Her chest x-ray is reassuring with no acute masses noted.  Patient will need further evaluation with a CT scan.  I discussed this with the patient she does have a primary care provider and understands that she needs to follow-up with her primary care provider this week for further evaluation and management of this enlarged lymph node.  She understands that this lymph node could be related to potential cancer and does need to be evaluated in a timely fashion.  She also has a nodule to her right scalp, no signs of surrounding infection warm compresses indicated.   She is slightly hypertensive here, I will refill her amlodipine.  Again patient verbalizes that she will follow-up closely with this week with her primary care provider for ongoing evaluation and management.  Final Clinical Impressions(s) / ED Diagnoses   Final diagnoses:  Lymphadenopathy  Hypertension, unspecified type  Papule    ED Discharge Orders         Ordered    amLODipine (NORVASC) 5 MG tablet  Daily     05/05/19 1415           Okey Regal, PA-C 05/05/19 1415    Tegeler, Gwenyth Allegra, MD 05/05/19 1744

## 2019-05-05 NOTE — ED Triage Notes (Signed)
Patient complains of pain to abscess on back of neck, also reports hx of same and also wants one checked on back of neck

## 2019-05-05 NOTE — Discharge Instructions (Signed)
As we discussed today it is very important that you contact your primary care provider today and schedule follow-up evaluation.  You need further evaluation for this enlarged lymph node as there are numerous etiologies the need to be ruled out including cancer.  If you are unable to follow-up with your primary care provider please return to the emergency room for ongoing evaluation and management.  Please use your irrigation as directed and return if you develop any new or worsening signs or symptoms.

## 2019-05-05 NOTE — ED Notes (Signed)
Pt understands just how serious her swelling lymph node can be , she understands that she needs to f/u with her PCP to rule out a cancerous lesion or abcess

## 2019-05-06 LAB — NOVEL CORONAVIRUS, NAA (HOSP ORDER, SEND-OUT TO REF LAB; TAT 18-24 HRS): SARS-CoV-2, NAA: NOT DETECTED

## 2019-05-09 ENCOUNTER — Encounter (HOSPITAL_COMMUNITY): Payer: Self-pay | Admitting: Emergency Medicine

## 2019-05-09 ENCOUNTER — Ambulatory Visit (HOSPITAL_COMMUNITY)
Admission: EM | Admit: 2019-05-09 | Discharge: 2019-05-09 | Disposition: A | Discharge status: Home / Self Care | Payer: BLUE CROSS/BLUE SHIELD | Attending: Emergency Medicine | Admitting: Emergency Medicine

## 2019-05-09 ENCOUNTER — Other Ambulatory Visit: Payer: Self-pay

## 2019-05-09 DIAGNOSIS — H9201 Otalgia, right ear: Secondary | ICD-10-CM | POA: Diagnosis not present

## 2019-05-09 DIAGNOSIS — R591 Generalized enlarged lymph nodes: Secondary | ICD-10-CM

## 2019-05-09 MED ORDER — NEOMYCIN-POLYMYXIN-HC 3.5-10000-1 OT SUSP: 4.0000 [drp] | Freq: Three times a day (TID) | OTIC | 0 refills | Status: DC

## 2019-05-09 MED ORDER — AMOXICILLIN-POT CLAVULANATE 875-125 MG PO TABS: 1.0000 | ORAL_TABLET | Freq: Two times a day (BID) | ORAL | 0 refills | Status: AC

## 2019-05-09 NOTE — ED Triage Notes (Signed)
PT reports right ear/ neck/ shoulder pain. PT went to ER on the first and had a CT scan of area.

## 2019-05-09 NOTE — Discharge Instructions (Addendum)
We will start antibiotics for your ear as well as drops to help with pain.  However, it is very very important for you to make an appointment ASAP with your PCP for outpatient evaluation of the swelling to your neck as this can be very serious.

## 2019-05-09 NOTE — ED Provider Notes (Signed)
[facility]    CSN: [id number] Arrival date & time: 05/09/19  1221      History   Chief Complaint Chief Complaint  Patient presents with  . Otalgia    HPI Misty [name] is a 28 y.o. female.   De Nurse presents with complaints of persistent right ear pain, started approximately 1 week ago. No drainage. No fever. No cough or congestion. Was seen in the ER on 9/1, was found to have supraclavicular lymphadenopathy, negative chest xray. Negative covid. Was encouraged to follow up with PCP. States her doctor wasn't able to see her last week, she does not have an appointment made. Doesn't smoke. No chest pain . No shortness of breath . No abdominal pain. No sore throat. Denies any previous similar. History  Of allergies, anemia, asthma, dm, eczema, htn.     ROS per HPI, negative if not otherwise mentioned.      Past Medical History:  Diagnosis Date  . Allergy   . Anemia 2015  . Asthma    as child  . Diabetes mellitus without complication ([place]) 9390  . Eczema   . Hypertension 09/2013    There are no active problems to display for this patient.   Past Surgical History:  Procedure Laterality Date  . Abcess on back      OB History    Gravida  1   Para  0   Term  0   Preterm  0   AB  1   Living  0     SAB  1   TAB  0   Ectopic  0   Multiple  0   Live Births               Home Medications    Prior to Admission medications   Medication Sig Start Date End Date Taking? Authorizing Provider  amLODipine (NORVASC) 5 MG tablet Take 1 tablet (5 mg total) by mouth daily. 05/05/19  Yes Hedges, Dellis Filbert, PA-C  metFORMIN (GLUCOPHAGE) 500 MG tablet Take 2 tablets (1,000 mg total) by mouth daily with breakfast. 01/14/19  Yes Plunkett, Loree Fee, MD  naproxen (NAPROSYN) 500 MG tablet Take 1 tablet (500 mg total) by mouth 2 (two) times daily. Take as needed 2 times a day for your menstrual cramps 01/14/19  Yes Blanchie Dessert, MD  Marilu Favre 150-35  MCG/24HR transdermal patch APPLY 1 PATCH ONTO THE SKIN ONCE A WEEK 04/17/19  Yes Fontaine, Belinda Block, MD  amoxicillin-clavulanate (AUGMENTIN) 875-125 MG tablet Take 1 tablet by mouth every 12 (twelve) hours for 5 days. 05/09/19 05/14/19  Zigmund Gottron, NP  blood glucose meter kit and supplies KIT Dispense based on patient and insurance preference. Use up to four times daily as directed. (FOR ICD-9 250.00, 250.01). 01/10/16   Hedges, Dellis Filbert, PA-C  dicyclomine (BENTYL) 20 MG tablet Take 1 tablet (20 mg total) by mouth 2 (two) times daily. 08/15/18   Recardo Evangelist, PA-C  fluticasone (FLONASE) 50 MCG/ACT nasal spray Place 2 sprays into both nostrils daily. 01/14/19   Blanchie Dessert, MD  ibuprofen (ADVIL,MOTRIN) 800 MG tablet Take 1 tablet (800 mg total) by mouth every 8 (eight) hours as needed. 11/04/18   Fontaine, Belinda Block, MD  ipratropium (ATROVENT) 0.06 % nasal spray Place 2 sprays into both nostrils 4 (four) times daily. 01/14/19   Blanchie Dessert, MD  naproxen (NAPROSYN) 500 MG tablet Take 1 tablet (500 mg total) by mouth every 12 (twelve) hours as needed for  moderate pain. 02/25/19   Fontaine, Belinda Block, MD  neomycin-polymyxin-hydrocortisone (CORTISPORIN) 3.5-10000-1 OTIC suspension Place 4 drops into the right ear 3 (three) times daily. 05/09/19   Zigmund Gottron, NP  omeprazole (PRILOSEC) 20 MG capsule Take 1 capsule (20 mg total) by mouth daily. 01/14/19   Blanchie Dessert, MD  ondansetron (ZOFRAN-ODT) 4 MG disintegrating tablet Take 1 tablet (4 mg total) by mouth every 8 (eight) hours as needed for nausea or vomiting. 01/14/19   Davonna Belling, MD    Family History Family History  Problem Relation Age of Onset  . Hypertension Mother   . Heart disease Mother 28       CHF  . Diabetes Mother   . Sickle cell anemia Brother   . Hypertension Maternal Grandfather   . Diabetes Maternal Grandfather   . Heart disease Maternal Grandfather   . Diabetes Maternal Grandmother   . Heart disease  Maternal Grandmother     Social History Social History   Tobacco Use  . Smoking status: Former Smoker    Packs/day: 0.50    Years: 2.00    Pack years: 1.00    Quit date: 09/08/2013    Years since quitting: 5.6  . Smokeless tobacco: Never Used  Substance Use Topics  . Alcohol use: No    Alcohol/week: 0.0 standard drinks  . Drug use: No     Allergies   Iodides, Morphine and related, Shellfish allergy, and Ultram [tramadol hcl]   Review of Systems Review of Systems   Physical Exam Triage Vital Signs ED Triage Vitals  Enc Vitals Group     BP 05/09/19 1319 (!) 145/91     Pulse Rate 05/09/19 1319 71     Resp 05/09/19 1319 16     Temp 05/09/19 1319 98.4 F (36.9 C)     Temp Source 05/09/19 1319 Oral     SpO2 05/09/19 1319 100 %     Weight --      Height --      Head Circumference --      Peak Flow --      Pain Score 05/09/19 1316 9     Pain Loc --      Pain Edu? --      Excl. in [place]? --    No data found.  Updated Vital Signs BP (!) 145/91   Pulse 71   Temp 98.4 F (36.9 C) (Oral)   Resp 16   SpO2 100%   Visual Acuity Right Eye Distance:   Left Eye Distance:   Bilateral Distance:    Right Eye Near:   Left Eye Near:    Bilateral Near:     Physical Exam Constitutional:      General: She is not in acute distress.    Appearance: She is well-developed.  HENT:     Head: Normocephalic and atraumatic.     Right Ear: Tenderness present. No drainage. Tympanic membrane is erythematous.     Ears:     Comments: Right canal is red, tender, pain with movement of the ear, TM is reddened as well Cardiovascular:     Rate and Rhythm: Normal rate.  Pulmonary:     Effort: Pulmonary effort is normal.  Lymphadenopathy:     Comments: Swelling and tenderness to right supraclavicular region; unable to palpate defined edges of a node; no redness or warmth; no fluctuance    Skin:    General: Skin is warm and dry.  Neurological:  Mental Status: She is alert and  oriented to person, place, and time.      UC Treatments / Results  Labs (all labs ordered are listed, but only abnormal results are displayed) Labs Reviewed - No data to display  EKG   Radiology No results found.  Procedures Procedures (including critical care time)  Medications Ordered in UC Medications - No data to display  Initial Impression / Assessment and Plan / UC Course  I have reviewed the triage vital signs and the nursing notes.  Pertinent labs & imaging results that were available during my care of the patient were reviewed by me and considered in my medical decision making (see chart for details).     Will cover for AOM. Cortisporin for canal redness and movement pain as well. Concern for supraclavicular lymphadenopathy. Emphasized importance of following up with PCP ASAP, patient verbalized understanding. Ambulatory out of clinic without difficulty.    Final Clinical Impressions(s) / UC Diagnoses   Final diagnoses:  Right ear pain  Lymphadenopathy     Discharge Instructions     We will start antibiotics for your ear as well as drops to help with pain.  However, it is very very important for you to make an appointment ASAP with your PCP for outpatient evaluation of the swelling to your neck as this can be very serious.    ED Prescriptions    Medication Sig Dispense Auth. Provider   amoxicillin-clavulanate (AUGMENTIN) 875-125 MG tablet Take 1 tablet by mouth every 12 (twelve) hours for 5 days. 10 tablet Augusto Gamble B, NP   neomycin-polymyxin-hydrocortisone (CORTISPORIN) 3.5-10000-1 OTIC suspension Place 4 drops into the right ear 3 (three) times daily. 10 mL Zigmund Gottron, NP     Controlled Substance Prescriptions [place] Controlled Substance Registry consulted? Not Applicable   Zigmund Gottron, NP 05/09/19 1408

## 2019-05-14 ENCOUNTER — Telehealth: Payer: Self-pay

## 2019-05-14 ENCOUNTER — Other Ambulatory Visit: Payer: Self-pay | Admitting: Gynecology

## 2019-05-14 MED ORDER — XULANE 150-35 MCG/24HR TD PTWK: MEDICATED_PATCH | TRANSDERMAL | 0 refills | Status: DC

## 2019-05-14 NOTE — Telephone Encounter (Signed)
1 refill only.  If she cancels again no more refills

## 2019-05-14 NOTE — Telephone Encounter (Signed)
Patient called for refill on Xulane patch.  She has CE scheduled 05/28/19 (is private pay).  She was due in June and cancelled on 6/12, 7/22 and 8/26.    Ok to refill?

## 2019-05-14 NOTE — Telephone Encounter (Signed)
Spoke with patient and advised. She assures me she will see Dr. Loetta Rough in September at scheduled appt.

## 2019-05-14 NOTE — Telephone Encounter (Signed)
CE scheduled 05/28/19.

## 2019-05-18 ENCOUNTER — Other Ambulatory Visit: Payer: Self-pay | Admitting: Gynecology

## 2019-05-24 ENCOUNTER — Other Ambulatory Visit: Payer: Self-pay | Admitting: Gynecology

## 2019-05-24 ENCOUNTER — Telehealth: Payer: BLUE CROSS/BLUE SHIELD | Admitting: Physician Assistant

## 2019-05-24 DIAGNOSIS — R05 Cough: Secondary | ICD-10-CM

## 2019-05-24 DIAGNOSIS — R059 Cough, unspecified: Secondary | ICD-10-CM

## 2019-05-24 MED ORDER — BENZONATATE 100 MG PO CAPS: 100.0000 mg | ORAL_CAPSULE | Freq: Two times a day (BID) | ORAL | 0 refills | Status: DC | PRN

## 2019-05-24 NOTE — Progress Notes (Signed)
We are sorry that you are not feeling well.  Here is how we plan to help!  Given your responses it sounds like you are having a cough without any other symptoms. Sometimes it can be difficult to determine the source. Given that you are not having fever with this it is less likely to be COVID but this is still on my list of concerns. In addition to the information on Cough I have also attached information about COVID. I have sent a prescription for the cough medication Tessalon to the pharmacy for you.   From your responses in the eVisit questionnaire you describe inflammation in the upper respiratory tract which is causing a significant cough.  This is commonly called Bronchitis and has four common causes:    Allergies  Viral Infections  Acid Reflux  Bacterial Infection Allergies, viruses and acid reflux are treated by controlling symptoms or eliminating the cause. An example might be a cough caused by taking certain blood pressure medications. You stop the cough by changing the medication. Another example might be a cough caused by acid reflux. Controlling the reflux helps control the cough.  USE OF BRONCHODILATOR ("RESCUE") INHALERS: There is a risk from using your bronchodilator too frequently.  The risk is that over-reliance on a medication which only relaxes the muscles surrounding the breathing tubes can reduce the effectiveness of medications prescribed to reduce swelling and congestion of the tubes themselves.  Although you feel brief relief from the bronchodilator inhaler, your asthma may actually be worsening with the tubes becoming more swollen and filled with mucus.  This can delay other crucial treatments, such as oral steroid medications. If you need to use a bronchodilator inhaler daily, several times per day, you should discuss this with your provider.  There are probably better treatments that could be used to keep your asthma under control.     HOME CARE . Only take medications  as instructed by your medical team. . Complete the entire course of an antibiotic. . Drink plenty of fluids and get plenty of rest. . Avoid close contacts especially the very young and the elderly . Cover your mouth if you cough or cough into your sleeve. . Always remember to wash your hands . A steam or ultrasonic humidifier can help congestion.   GET HELP RIGHT AWAY IF: . You develop worsening fever. . You become short of breath . You cough up blood. . Your symptoms persist after you have completed your treatment plan MAKE SURE YOU   Understand these instructions.  Will watch your condition.  Will get help right away if you are not doing well or get worse.  E-Visit for Corona Virus Screening   Your current symptoms could be consistent with the coronavirus.  Many health care providers can now test patients at their office but not all are.  [place] has multiple testing sites. For information on our COVID testing locations and hours go to [email or web address]  Please quarantine yourself while awaiting your test results.  We are enrolling you in our MyChart Home Mont[facility]ing for COVID19 . Daily you will receive a questionnaire within the MyChart website. Our COVID 19 response team willl be monitoriing your responses daily.    COVID-19 is a respiratory illness with symptoms that are similar to the flu. Symptoms are typically mild to moderate, but there have been cases of severe illness and death due to the virus. The following symptoms may appear 2-14 days after exposure: . Fever . Cough .  Shortness of breath or difficulty breathing . Chills . Repeated shaking with chills . Muscle pain . Headache . Sore throat . New loss of taste or smell . Fatigue . Congestion or runny nose . Nausea or vomiting . Diarrhea  It is vitally important that if you feel that you have an infection such as this virus or any other virus that you stay home and away from  places where you may spread it to others.  You should self-quarantine for 14 days if you have symptoms that could potentially be coronavirus or have been in close contact a with a person diagnosed with COVID-19 within the last 2 weeks. You should avoid contact with people age 28 and older.   You should wear a mask or cloth face covering over your nose and mouth if you must be around other people or animals, including pets (even at home). Try to stay at least 6 feet away from other people. This will protect the people around you.  You may also take acetaminophen (Tylenol) as needed for fever.   Reduce your risk of any infection by using the same precautions used for avoiding the common cold or flu:  Marland Kitchen. Wash your hands often with soap and warm water for at least 20 seconds.  If soap and water are not readily available, use an alcohol-based hand sanitizer with at least 60% alcohol.  . If coughing or sneezing, cover your mouth and nose by coughing or sneezing into the elbow areas of your shirt or coat, into a tissue or into your sleeve (not your hands). . Avoid shaking hands with others and consider head nods or verbal greetings only. . Avoid touching your eyes, nose, or mouth with unwashed hands.  . Avoid close contact with people who are sick. . Avoid places or events with large numbers of people in one location, like concerts or sporting events. . Carefully consider travel plans you have or are making. . If you are planning any travel outside or inside the [place], visit the CDC's Travelers' Health webpage for the latest health notices. . If you have some symptoms but not all symptoms, continue to monitor at home and seek medical attention if your symptoms worsen. . If you are having a medical emergency, call 911.  HOME CARE . Only take medications as instructed by your medical team. . Drink plenty of fluids and get plenty of rest. . A steam or ultrasonic humidifier can help if you have congestion.    GET HELP RIGHT AWAY IF YOU HAVE EMERGENCY WARNING SIGNS** FOR COVID-19. If you or someone is showing any of these signs seek emergency medical care immediately. Call 911 or proceed to your closest emergency facility if: . You develop worsening high fever. . Trouble breathing . Bluish lips or face . Persistent pain or pressure in the chest . New confusion . Inability to wake or stay awake . You cough up blood. . Your symptoms become more severe  **This list is not all possible symptoms. Contact your medical provider for any symptoms that are sever or concerning to you.   MAKE SURE YOU   Understand these instructions.  Will watch your condition.  Will get help right away if you are not doing well or get worse.   Your e-visit answers were reviewed by a board certified advanced clinical practitioner to complete your personal care plan.  Depending on the condition, your plan could have included both over the counter or prescription medications. If there  is a problem please reply  once you have received a response from your provider. Your safety is important to [place].  If you have drug allergies check your prescription carefully.    You can use MyChart to ask questions about today's visit, request a non-urgent call back, or ask for a work or school excuse for 24 hours related to this e-Visit. If it has been greater than 24 hours you will need to follow up with your provider, or enter a new e-Visit to address those concerns. You will get an e-mail in the next two days asking about your experience.  I hope that your e-visit has been valuable and will speed your recovery. Thank you for using e-visits.  Greater than 5 minutes, yet less than 10 minutes of time have been spent researching, coordinating, and implementing care for this patient today

## 2019-05-25 ENCOUNTER — Encounter: Payer: Self-pay | Admitting: Gynecology

## 2019-05-28 ENCOUNTER — Encounter: Payer: Self-pay | Admitting: Gynecology

## 2019-05-28 DIAGNOSIS — Z0289 Encounter for other administrative examinations: Secondary | ICD-10-CM

## 2019-06-22 ENCOUNTER — Other Ambulatory Visit: Payer: Self-pay | Admitting: Gynecology

## 2019-08-26 ENCOUNTER — Encounter: Payer: Self-pay | Admitting: Gynecology

## 2019-08-26 DIAGNOSIS — Z0289 Encounter for other administrative examinations: Secondary | ICD-10-CM

## 2019-08-31 ENCOUNTER — Other Ambulatory Visit: Payer: Self-pay

## 2019-08-31 ENCOUNTER — Encounter (HOSPITAL_COMMUNITY): Payer: Self-pay | Admitting: Emergency Medicine

## 2019-08-31 ENCOUNTER — Ambulatory Visit (HOSPITAL_COMMUNITY)
Admission: EM | Admit: 2019-08-31 | Discharge: 2019-08-31 | Disposition: A | Discharge status: Home / Self Care | Payer: BLUE CROSS/BLUE SHIELD | Attending: Family Medicine | Admitting: Family Medicine

## 2019-08-31 DIAGNOSIS — Z91041 Radiographic dye allergy status: Secondary | ICD-10-CM | POA: Insufficient documentation

## 2019-08-31 DIAGNOSIS — B349 Viral infection, unspecified: Secondary | ICD-10-CM | POA: Insufficient documentation

## 2019-08-31 DIAGNOSIS — Z79899 Other long term (current) drug therapy: Secondary | ICD-10-CM | POA: Insufficient documentation

## 2019-08-31 DIAGNOSIS — I10 Essential (primary) hypertension: Secondary | ICD-10-CM | POA: Insufficient documentation

## 2019-08-31 DIAGNOSIS — R630 Anorexia: Secondary | ICD-10-CM | POA: Insufficient documentation

## 2019-08-31 DIAGNOSIS — Z91013 Allergy to seafood: Secondary | ICD-10-CM | POA: Insufficient documentation

## 2019-08-31 DIAGNOSIS — Z885 Allergy status to narcotic agent status: Secondary | ICD-10-CM | POA: Insufficient documentation

## 2019-08-31 DIAGNOSIS — Z886 Allergy status to analgesic agent status: Secondary | ICD-10-CM | POA: Insufficient documentation

## 2019-08-31 DIAGNOSIS — Z8249 Family history of ischemic heart disease and other diseases of the circulatory system: Secondary | ICD-10-CM | POA: Insufficient documentation

## 2019-08-31 DIAGNOSIS — Z20828 Contact with and (suspected) exposure to other viral communicable diseases: Secondary | ICD-10-CM | POA: Insufficient documentation

## 2019-08-31 DIAGNOSIS — Z793 Long term (current) use of hormonal contraceptives: Secondary | ICD-10-CM | POA: Insufficient documentation

## 2019-08-31 DIAGNOSIS — Z87891 Personal history of nicotine dependence: Secondary | ICD-10-CM | POA: Insufficient documentation

## 2019-08-31 DIAGNOSIS — Z833 Family history of diabetes mellitus: Secondary | ICD-10-CM | POA: Insufficient documentation

## 2019-08-31 DIAGNOSIS — Z7984 Long term (current) use of oral hypoglycemic drugs: Secondary | ICD-10-CM | POA: Insufficient documentation

## 2019-08-31 DIAGNOSIS — E119 Type 2 diabetes mellitus without complications: Secondary | ICD-10-CM | POA: Insufficient documentation

## 2019-08-31 MED ORDER — AMLODIPINE BESYLATE 5 MG PO TABS: 5.0000 mg | ORAL_TABLET | Freq: Every day | ORAL | 0 refills | Status: DC

## 2019-08-31 MED ORDER — ONDANSETRON HCL 4 MG PO TABS: 4.0000 mg | ORAL_TABLET | Freq: Three times a day (TID) | ORAL | 0 refills | Status: DC | PRN

## 2019-08-31 NOTE — Discharge Instructions (Addendum)
Continue ibuprofen 2x 200mg  tablet every 6 hours. Also may use regular strength tylenol 2x 325mg  tablets every 6 hours  Use zofran x1 tablet up to every 8 hours as needed for nausea.  Drink plenty of water and 1 gatorade daily and eat small bland meals.   I refilled your amlodipine, please resume this as you were previously taking  Go directly to the Emergency Department or call 911 if you have severe chest pain, shortness of breath, severe diarrhea or feel as though you might pass out.    If your Covid-19 test is positive, you will receive a phone call from [facility] regarding your results. Negative test results are not called. Both positive and negative results area always visible on MyChart. If you do not have a MyChart account, sign up instructions are in your discharge papers.   Persons who are directed to care for themselves at home may discontinue isolation under the following conditions:   At least 10 days have passed since symptom onset and  At least 24 hours have passed without running a fever (this means without the use of fever-reducing medications) and  Other symptoms have improved.  Persons infected with COVID-19 who never develop symptoms may discontinue isolation and other precautions 10 days after the date of their first positive COVID-19 test.

## 2019-08-31 NOTE — ED Provider Notes (Addendum)
[facility]    CSN: [id number] Arrival date & time: 08/31/19  1027      History   Chief Complaint Chief Complaint  Patient presents with  . Cough    HPI Misty [name] is a 28 y.o. female.   Patient reports to urgent care today for cough, fever and body aches for 3 days and would like a covid test. She reports symptoms began Friday into Saturday. She reports general body aches, fatigue, mild congestion, nausea a non-productive cough and a headache. She reports her headache was "bad" last night but took 3 ibuprofen and had complete resolution. She denies headache in office today. She does report a history of headaches but this felt somewhat different. She endorses a loss of appetite. She denies sore throat.   She does not report a known contact.   She reports being out of her amlodipine and her blood pressure is normally under control.      Past Medical History:  Diagnosis Date  . Allergy   . Anemia 2015  . Asthma    as child  . Diabetes mellitus without complication (HCC) 2015  . Eczema   . Hypertension 09/2013    There are no problems to display for this patient.   Past Surgical History:  Procedure Laterality Date  . Abcess on back      OB History    Gravida  1   Para  0   Term  0   Preterm  0   AB  1   Living  0     SAB  1   TAB  0   Ectopic  0   Multiple  0   Live Births               Home Medications    Prior to Admission medications   Medication Sig Start Date End Date Taking? Authorizing Provider  metFORMIN (GLUCOPHAGE) 500 MG tablet Take 2 tablets (1,000 mg total) by mouth daily with breakfast. 01/14/19  Yes Plunkett, Whitney, MD  amLODipine (NORVASC) 5 MG tablet Take 1 tablet (5 mg total) by mouth daily. 08/31/19   Darr, Jacob E, PA-C  benzonatate (TESSALON) 100 MG capsule Take 1 capsule (100 mg total) by mouth 2 (two) times daily as needed for cough. 05/24/19   Hedges, Jeffrey, PA-C  blood glucose meter kit and  supplies KIT Dispense based on patient and insurance preference. Use up to four times daily as directed. (FOR ICD-9 250.00, 250.01). 01/10/16   Hedges, Jeffrey, PA-C  dicyclomine (BENTYL) 20 MG tablet Take 1 tablet (20 mg total) by mouth 2 (two) times daily. 08/15/18   Gekas, Kelly Marie, PA-C  fluticasone (FLONASE) 50 MCG/ACT nasal spray Place 2 sprays into both nostrils daily. 01/14/19   Plunkett, Whitney, MD  ibuprofen (ADVIL,MOTRIN) 800 MG tablet Take 1 tablet (800 mg total) by mouth every 8 (eight) hours as needed. 11/04/18   Fontaine, Timothy P, MD  ipratropium (ATROVENT) 0.06 % nasal spray Place 2 sprays into both nostrils 4 (four) times daily. 01/14/19   Plunkett, Whitney, MD  naproxen (NAPROSYN) 500 MG tablet Take 1 tablet (500 mg total) by mouth 2 (two) times daily. Take as needed 2 times a day for your menstrual cramps 01/14/19   Plunkett, Whitney, MD  naproxen (NAPROSYN) 500 MG tablet TAKE 1 TABLET BY MOUTH EVERY TWELVE HOURS AS NEEDED FOR MODERATE PAIN 05/14/19   Fontaine, Timothy P, MD  neomycin-polymyxin-hydrocortisone (CORTISPORIN) 3.5-10000-1 OTIC suspension Place 4 drops   into the right ear 3 (three) times daily. 05/09/19   Zigmund Gottron, NP  norelgestromin-ethinyl estradiol Marilu Favre) 150-35 MCG/24HR transdermal patch Apply one patch onto the skin once weekly as directed. 05/14/19   Fontaine, Belinda Block, MD  omeprazole (PRILOSEC) 20 MG capsule Take 1 capsule (20 mg total) by mouth daily. 01/14/19   Blanchie Dessert, MD  ondansetron (ZOFRAN) 4 MG tablet Take 1 tablet (4 mg total) by mouth every 8 (eight) hours as needed for nausea. 08/31/19   Bryannah Boston, Marguerita Beards, PA-C  ondansetron (ZOFRAN-ODT) 4 MG disintegrating tablet Take 1 tablet (4 mg total) by mouth every 8 (eight) hours as needed for nausea or vomiting. 01/14/19   Davonna Belling, MD    Family History Family History  Problem Relation Age of Onset  . Hypertension Mother   . Heart disease Mother 61       CHF  . Diabetes Mother   . Sickle  cell anemia Brother   . Hypertension Maternal Grandfather   . Diabetes Maternal Grandfather   . Heart disease Maternal Grandfather   . Diabetes Maternal Grandmother   . Heart disease Maternal Grandmother     Social History Social History   Tobacco Use  . Smoking status: Former Smoker    Packs/day: 0.50    Years: 2.00    Pack years: 1.00    Quit date: 09/08/2013    Years since quitting: 5.9  . Smokeless tobacco: Never Used  Substance Use Topics  . Alcohol use: No    Alcohol/week: 0.0 standard drinks  . Drug use: No     Allergies   Iodides, Morphine and related, Shellfish allergy, and Ultram [tramadol hcl]   Review of Systems Review of Systems  Constitutional: Positive for chills, fatigue and fever.  HENT: Positive for congestion and sinus pressure. Negative for ear pain and sore throat.   Eyes: Negative for pain and visual disturbance.  Respiratory: Positive for cough. Negative for shortness of breath.   Cardiovascular: Negative for chest pain and palpitations.  Gastrointestinal: Negative for abdominal pain, diarrhea, nausea and vomiting.  Genitourinary: Negative for dysuria and hematuria.  Musculoskeletal: Positive for arthralgias and myalgias. Negative for back pain.  Skin: Negative for color change and rash.  Neurological: Positive for headaches. Negative for dizziness, seizures, syncope and numbness.  All other systems reviewed and are negative.    Physical Exam Triage Vital Signs ED Triage Vitals  Enc Vitals Group     BP 08/31/19 1220 (!) 162/104     Pulse Rate 08/31/19 1220 69     Resp 08/31/19 1220 16     Temp 08/31/19 1220 98.7 F (37.1 C)     Temp Source 08/31/19 1220 Oral     SpO2 08/31/19 1220 100 %     Weight --      Height --      Head Circumference --      Peak Flow --      Pain Score 08/31/19 1217 0     Pain Loc --      Pain Edu? --      Excl. in [place]? --    No data found.  Updated Vital Signs BP (!) 162/104   Pulse 69   Temp 98.7 F  (37.1 C) (Oral)   Resp 16   LMP 08/23/2019   SpO2 100%   Visual Acuity Right Eye Distance:   Left Eye Distance:   Bilateral Distance:    Right Eye Near:   Left Eye Near:  Bilateral Near:     Physical Exam Vitals and nursing note reviewed.  Constitutional:      General: She is not in acute distress.    Appearance: She is well-developed.  HENT:     Head: Normocephalic and atraumatic.     Right Ear: Tympanic membrane normal.     Left Ear: Tympanic membrane normal.     Nose: Rhinorrhea present. No congestion.     Mouth/Throat:     Mouth: Mucous membranes are moist.     Pharynx: Oropharynx is clear. No posterior oropharyngeal erythema.  Eyes:     Extraocular Movements: Extraocular movements intact.     Conjunctiva/sclera: Conjunctivae normal.     Pupils: Pupils are equal, round, and reactive to light.  Cardiovascular:     Rate and Rhythm: Normal rate and regular rhythm.     Pulses: Normal pulses.     Heart sounds: No murmur. No friction rub. No gallop.   Pulmonary:     Effort: Pulmonary effort is normal. No respiratory distress.     Breath sounds: Normal breath sounds. No wheezing or rales.  Abdominal:     Palpations: Abdomen is soft.     Tenderness: There is no abdominal tenderness.  Musculoskeletal:     Cervical back: Neck supple.     Right lower leg: No edema.     Left lower leg: No edema.  Lymphadenopathy:     Cervical: No cervical adenopathy.  Skin:    General: Skin is warm and dry.  Neurological:     General: No focal deficit present.     Mental Status: She is alert and oriented to person, place, and time.     Cranial Nerves: No cranial nerve deficit.     Motor: No weakness.  Psychiatric:        Behavior: Behavior normal.        Thought Content: Thought content normal.        Judgment: Judgment normal.      UC Treatments / Results  Labs (all labs ordered are listed, but only abnormal results are displayed) Labs Reviewed  NOVEL CORONAVIRUS, NAA  (HOSP ORDER, SEND-OUT TO REF LAB; TAT 18-24 HRS)    EKG   Radiology No results found.  Procedures Procedures (including critical care time)  Medications Ordered in UC Medications - No data to display  Initial Impression / Assessment and Plan / UC Course  I have reviewed the triage vital signs and the nursing notes.  Pertinent labs & imaging results that were available during my care of the patient were reviewed by me and considered in my medical decision making (see chart for details).     #Viral illness # elevated Blood pressure - Viral Syndrome. Responding to ibuprofen therapy for headache. Covid PCR sent. ED precautions discussed - Blood pressure elevated due to illness and being off medications. No acute concerns. Refilled amlodipine and instructed to restart this. Informed patient to follow up with Primary care for continued monitoring.    Final Clinical Impressions(s) / UC Diagnoses   Final diagnoses:  Viral illness  Elevated blood pressure reading in office with diagnosis of hypertension     Discharge Instructions     Continue ibuprofen 2x 223m tablet every 6 hours. Also may use regular strength tylenol 2x 3216mtablets every 6 hours  Use zofran x1 tablet up to every 8 hours as needed for nausea.  Drink plenty of water and 1 gatorade daily and eat small bland meals.   I  refilled your amlodipine, please resume this as you were previously taking  Go directly to the Emergency Department or call 911 if you have severe chest pain, shortness of breath, severe diarrhea or feel as though you might pass out.    If your Covid-19 test is positive, you will receive a phone call from [facility] regarding your results. Negative test results are not called. Both positive and negative results area always visible on MyChart. If you do not have a MyChart account, sign up instructions are in your discharge papers.   Persons who are directed to care for themselves at home  may discontinue isolation under the following conditions:  . At least 10 days have passed since symptom onset and . At least 24 hours have passed without running a fever (this means without the use of fever-reducing medications) and . Other symptoms have improved.  Persons infected with COVID-19 who never develop symptoms may discontinue isolation and other precautions 10 days after the date of their first positive COVID-19 test.       ED Prescriptions    Medication Sig Dispense Auth. Provider   amLODipine (NORVASC) 5 MG tablet Take 1 tablet (5 mg total) by mouth daily. 30 tablet Darr, Jacob E, PA-C   ondansetron (ZOFRAN) 4 MG tablet Take 1 tablet (4 mg total) by mouth every 8 (eight) hours as needed for nausea. 4 tablet Darr, Jacob E, PA-C     PDMP not reviewed this encounter.   Darr, Jacob E, PA-C 08/31/19 1804    Darr, Jacob E, PA-C 08/31/19 1805  

## 2019-08-31 NOTE — ED Triage Notes (Signed)
PT reports cough, fever for 3 days. Wants COVID testing.

## 2019-09-01 LAB — NOVEL CORONAVIRUS, NAA (HOSP ORDER, SEND-OUT TO REF LAB; TAT 18-24 HRS): SARS-CoV-2, NAA: NOT DETECTED

## 2019-10-05 ENCOUNTER — Other Ambulatory Visit: Payer: Self-pay | Admitting: *Deleted

## 2019-10-16 ENCOUNTER — Other Ambulatory Visit: Payer: Self-pay

## 2019-10-16 NOTE — Telephone Encounter (Signed)
Patient very overdue for annual exam. Patient has been advised repeatedly regarding need to schedule CE.  She c/c 4 times. The last time Dr. Velvet Bathe advised "1 refill only.  If she cancels again no more refills." Pharmacy was advised of this.

## 2020-01-16 ENCOUNTER — Other Ambulatory Visit: Payer: Self-pay

## 2020-01-16 ENCOUNTER — Ambulatory Visit (HOSPITAL_COMMUNITY)
Admission: EM | Admit: 2020-01-16 | Discharge: 2020-01-16 | Disposition: A | Discharge status: Home / Self Care | Payer: 59 | Attending: Family Medicine | Admitting: Family Medicine

## 2020-01-16 ENCOUNTER — Encounter (HOSPITAL_COMMUNITY): Payer: Self-pay | Admitting: *Deleted

## 2020-01-16 DIAGNOSIS — Z832 Family history of diseases of the blood and blood-forming organs and certain disorders involving the immune mechanism: Secondary | ICD-10-CM | POA: Insufficient documentation

## 2020-01-16 DIAGNOSIS — Z20822 Contact with and (suspected) exposure to covid-19: Secondary | ICD-10-CM | POA: Insufficient documentation

## 2020-01-16 DIAGNOSIS — Z833 Family history of diabetes mellitus: Secondary | ICD-10-CM | POA: Insufficient documentation

## 2020-01-16 DIAGNOSIS — J029 Acute pharyngitis, unspecified: Secondary | ICD-10-CM

## 2020-01-16 DIAGNOSIS — I10 Essential (primary) hypertension: Secondary | ICD-10-CM | POA: Insufficient documentation

## 2020-01-16 DIAGNOSIS — E119 Type 2 diabetes mellitus without complications: Secondary | ICD-10-CM | POA: Diagnosis not present

## 2020-01-16 DIAGNOSIS — Z885 Allergy status to narcotic agent status: Secondary | ICD-10-CM | POA: Insufficient documentation

## 2020-01-16 DIAGNOSIS — Z79899 Other long term (current) drug therapy: Secondary | ICD-10-CM | POA: Diagnosis not present

## 2020-01-16 DIAGNOSIS — Z91013 Allergy to seafood: Secondary | ICD-10-CM | POA: Insufficient documentation

## 2020-01-16 DIAGNOSIS — Z87891 Personal history of nicotine dependence: Secondary | ICD-10-CM | POA: Diagnosis not present

## 2020-01-16 DIAGNOSIS — Z8249 Family history of ischemic heart disease and other diseases of the circulatory system: Secondary | ICD-10-CM | POA: Diagnosis not present

## 2020-01-16 LAB — SARS CORONAVIRUS 2 (TAT 6-24 HRS): SARS Coronavirus 2: NEGATIVE

## 2020-01-16 MED ORDER — CETIRIZINE HCL 10 MG PO TABS: 10.0000 mg | ORAL_TABLET | Freq: Every day | ORAL | 0 refills | Status: DC

## 2020-01-16 MED ORDER — KETOROLAC TROMETHAMINE 30 MG/ML IJ SOLN
30.0000 mg | Freq: Once | INTRAMUSCULAR | Status: AC
  Administered 2020-01-16: 30 mg via INTRAMUSCULAR

## 2020-01-16 MED ORDER — IBUPROFEN 800 MG PO TABS: 800.0000 mg | ORAL_TABLET | Freq: Three times a day (TID) | ORAL | 3 refills | Status: DC | PRN

## 2020-01-16 MED ORDER — KETOROLAC TROMETHAMINE 30 MG/ML IJ SOLN
INTRAMUSCULAR | Status: AC
  Filled 2020-01-16: qty 1

## 2020-01-16 NOTE — Discharge Instructions (Signed)
Believe this is allergy related or may be some sort of virus.  No concern for infection at this time. Zyrtec daily and 800 mg ibuprofen as needed for pain and body aches Work note given Follow up as needed for continued or worsening symptoms

## 2020-01-16 NOTE — ED Triage Notes (Signed)
C/O cough with productive sputum, sore throat, nasal congestion, body aches, feeling feverish x 3 days.

## 2020-01-18 NOTE — ED Provider Notes (Signed)
[place]    CSN: [id number] Arrival date & time: 01/16/20  1626      History   Chief Complaint Chief Complaint  Patient presents with  . Cough  . Sore Throat    HPI Misty [name] is a 29 y.o. female.   Patient is a 29 year old female with past history of allergy, anemia, asthma, diabetes, eczema, hypertension.  She presents today with approximate 3 days of productive cough, sore throat, nasal congestion, body aches and feeling feverish.  Symptoms have been constant.  She has not take anything for her symptoms.no known sick contacts.   ROS per HPI      Past Medical History:  Diagnosis Date  . Allergy   . Anemia 2015  . Asthma    as child  . Diabetes mellitus without complication ([place]) 3009  . Eczema   . Hypertension 09/2013    There are no problems to display for this patient.   Past Surgical History:  Procedure Laterality Date  . Abcess on back      OB History    Gravida  1   Para  0   Term  0   Preterm  0   AB  1   Living  0     SAB  1   TAB  0   Ectopic  0   Multiple  0   Live Births               Home Medications    Prior to Admission medications   Medication Sig Start Date End Date Taking? Authorizing Provider  amLODipine (NORVASC) 5 MG tablet Take 1 tablet (5 mg total) by mouth daily. 08/31/19  Yes Darr, Marguerita Beards, PA-C  metFORMIN (GLUCOPHAGE) 500 MG tablet Take 2 tablets (1,000 mg total) by mouth daily with breakfast. 01/14/19  Yes Blanchie Dessert, MD  blood glucose meter kit and supplies KIT Dispense based on patient and insurance preference. Use up to four times daily as directed. (FOR ICD-9 250.00, 250.01). 01/10/16   Hedges, Dellis Filbert, PA-C  cetirizine (ZYRTEC) 10 MG tablet Take 1 tablet (10 mg total) by mouth daily. 01/16/20   Loura Halt A, NP  ibuprofen (ADVIL) 800 MG tablet Take 1 tablet (800 mg total) by mouth every 8 (eight) hours as needed. 01/16/20   Orvan July, NP  norelgestromin-ethinyl estradiol  Marilu Favre) 150-35 MCG/24HR transdermal patch Apply one patch onto the skin once weekly as directed. 05/14/19   Fontaine, Belinda Block, MD  dicyclomine (BENTYL) 20 MG tablet Take 1 tablet (20 mg total) by mouth 2 (two) times daily. 08/15/18 01/16/20  Recardo Evangelist, PA-C  fluticasone (FLONASE) 50 MCG/ACT nasal spray Place 2 sprays into both nostrils daily. 01/14/19 01/16/20  Blanchie Dessert, MD  ipratropium (ATROVENT) 0.06 % nasal spray Place 2 sprays into both nostrils 4 (four) times daily. 01/14/19 01/16/20  Blanchie Dessert, MD  omeprazole (PRILOSEC) 20 MG capsule Take 1 capsule (20 mg total) by mouth daily. 01/14/19 01/16/20  Blanchie Dessert, MD    Family History Family History  Problem Relation Age of Onset  . Hypertension Mother   . Heart disease Mother 38       CHF  . Diabetes Mother   . Sickle cell anemia Brother   . Hypertension Maternal Grandfather   . Diabetes Maternal Grandfather   . Heart disease Maternal Grandfather   . Healthy Father   . Diabetes Maternal Grandmother   . Heart disease Maternal Grandmother  Social History Social History   Tobacco Use  . Smoking status: Former Smoker    Packs/day: 0.50    Years: 2.00    Pack years: 1.00    Quit date: 09/08/2013    Years since quitting: 6.3  . Smokeless tobacco: Never Used  Substance Use Topics  . Alcohol use: No    Alcohol/week: 0.0 standard drinks  . Drug use: No     Allergies   Iodides, Morphine and related, Shellfish allergy, and Ultram [tramadol hcl]   Review of Systems Review of Systems   Physical Exam Triage Vital Signs ED Triage Vitals  Enc Vitals Group     BP 01/16/20 1728 133/87     Pulse Rate 01/16/20 1728 83     Resp 01/16/20 1728 16     Temp 01/16/20 1728 99.3 F (37.4 C)     Temp Source 01/16/20 1728 Oral     SpO2 01/16/20 1728 100 %     Weight --      Height --      Head Circumference --      Peak Flow --      Pain Score 01/16/20 1743 8     Pain Loc --      Pain Edu? --       Excl. in [place]? --    No data found.  Updated Vital Signs BP 133/87 (BP Location: Left Arm)   Pulse 83   Temp 99.3 F (37.4 C) (Oral)   Resp 16   LMP 01/13/2020 (Exact Date)   SpO2 100%   Visual Acuity Right Eye Distance:   Left Eye Distance:   Bilateral Distance:    Right Eye Near:   Left Eye Near:    Bilateral Near:     Physical Exam Vitals and nursing note reviewed.  Constitutional:      General: She is not in acute distress.    Appearance: Normal appearance. She is not ill-appearing, toxic-appearing or diaphoretic.  HENT:     Head: Normocephalic.     Right Ear: Tympanic membrane and ear canal normal.     Left Ear: Tympanic membrane and ear canal normal.     Nose: Nose normal.     Mouth/Throat:     Pharynx: Oropharynx is clear.  Eyes:     Conjunctiva/sclera: Conjunctivae normal.  Cardiovascular:     Rate and Rhythm: Normal rate and regular rhythm.  Pulmonary:     Effort: Pulmonary effort is normal.     Breath sounds: Normal breath sounds.  Musculoskeletal:        General: Normal range of motion.     Cervical back: Normal range of motion.  Skin:    General: Skin is warm and dry.     Findings: No rash.  Neurological:     Mental Status: She is alert.  Psychiatric:        Mood and Affect: Mood normal.      UC Treatments / Results  Labs (all labs ordered are listed, but only abnormal results are displayed) Labs Reviewed  SARS CORONAVIRUS 2 (TAT 6-24 HRS)    EKG   Radiology No results found.  Procedures Procedures (including critical care time)  Medications Ordered in UC Medications  ketorolac (TORADOL) 30 MG/ML injection 30 mg (30 mg Intramuscular Given 01/16/20 1804)    Initial Impression / Assessment and Plan / UC Course  I have reviewed the triage vital signs and the nursing notes.  Pertinent labs & imaging results that  were available during my care of the patient were reviewed by me and considered in my medical decision making (see chart  for details).     Sore throat No concern for strep at this time most of the some sort of viral illness. Toradol given here for pain for body aches and sore throat. Recommended and milligrams ibuprofen as needed for pain and body aches.  Zyrtec daily for allergy symptoms. Follow up as needed for continued or worsening symptoms Final Clinical Impressions(s) / UC Diagnoses   Final diagnoses:  Sore throat     Discharge Instructions     Believe this is allergy related or may be some sort of virus.  No concern for infection at this time. Zyrtec daily and 800 mg ibuprofen as needed for pain and body aches Work note given Follow up as needed for continued or worsening symptoms     ED Prescriptions    Medication Sig Dispense Auth. Provider   ibuprofen (ADVIL) 800 MG tablet Take 1 tablet (800 mg total) by mouth every 8 (eight) hours as needed. 30 tablet Wandell Scullion A, NP   cetirizine (ZYRTEC) 10 MG tablet Take 1 tablet (10 mg total) by mouth daily. 30 tablet Loura Halt A, NP     PDMP not reviewed this encounter.   Orvan July, NP 01/18/20 [phone]

## 2020-02-23 ENCOUNTER — Other Ambulatory Visit: Payer: Self-pay

## 2020-02-24 ENCOUNTER — Encounter (HOSPITAL_COMMUNITY): Payer: Self-pay

## 2020-02-24 ENCOUNTER — Other Ambulatory Visit: Payer: Self-pay

## 2020-02-24 ENCOUNTER — Encounter: Payer: Self-pay | Admitting: Obstetrics and Gynecology

## 2020-02-24 ENCOUNTER — Ambulatory Visit (HOSPITAL_COMMUNITY)
Admission: EM | Admit: 2020-02-24 | Discharge: 2020-02-24 | Disposition: A | Discharge status: Home / Self Care | Payer: 59 | Attending: Family Medicine | Admitting: Family Medicine

## 2020-02-24 DIAGNOSIS — B9689 Other specified bacterial agents as the cause of diseases classified elsewhere: Secondary | ICD-10-CM | POA: Insufficient documentation

## 2020-02-24 DIAGNOSIS — L089 Local infection of the skin and subcutaneous tissue, unspecified: Secondary | ICD-10-CM | POA: Insufficient documentation

## 2020-02-24 DIAGNOSIS — Z113 Encounter for screening for infections with a predominantly sexual mode of transmission: Secondary | ICD-10-CM | POA: Diagnosis present

## 2020-02-24 MED ORDER — DOXYCYCLINE HYCLATE 100 MG PO CAPS
100.0000 mg | ORAL_CAPSULE | Freq: Two times a day (BID) | ORAL | 0 refills | Status: DC
Start: 2020-02-24 — End: 2020-07-02

## 2020-02-24 NOTE — ED Triage Notes (Signed)
Pt is here denying ALL signs & symptoms, pt did noticed an abscess on her buttoocks last night, pt last had unprotected sex Monday with her long time partner, pt wants STD testing.

## 2020-02-24 NOTE — Discharge Instructions (Signed)
We have sent testing for sexually transmitted infections. We will notify you of any positive results once they are received. If required, we will prescribe any medications you might need.  Please refrain from all sexual activity for at least the next seven days.  

## 2020-02-24 NOTE — ED Provider Notes (Addendum)
[facility]   [id number] 02/24/20 Arrival Time: 1018  ASSESSMENT & PLAN:  1. Screening for STDs (sexually transmitted diseases)   2. Superficial bacterial infection of skin     Declines empiric gonorrhea treatment. Will treat with doxy for superficial skin infection of left buttock in hopes to prevent abscess formation. No I&D indicated today.  Meds ordered this encounter  Medications  . doxycycline (VIBRAMYCIN) 100 MG capsule    Sig: Take 1 capsule (100 mg total) by mouth 2 (two) times daily.    Dispense:  14 capsule    Refill:  0      Discharge Instructions     We have sent testing for sexually transmitted infections. We will notify you of any positive results once they are received. If required, we will prescribe any medications you might need.  Please refrain from all sexual activity for at least the next seven days.     Without s/s of PID.  Labs Reviewed  CERVICOVAGINAL ANCILLARY ONLY    Will notify of any positive results. Instructed to refrain from sexual activity for at least seven days.  Reviewed expectations re: course of current medical issues. Questions answered. Outlined signs and symptoms indicating need for more acute intervention. Patient verbalized understanding. After Visit Summary given.   SUBJECTIVE:  Misty [name] is a 29 y.o. female who presents with complaint possible skin infection; lower left buttock; first noted last evening; 'small sore spot'; no bleeding; minimal discomfort. Afebrile. Also would like STD testing. One female sexual partner. No vaginal discharge or urinary symptoms.    Patient's last menstrual period was 02/06/2020.   OBJECTIVE:  Vitals:   02/24/20 1037  BP: 135/86  Pulse: 92  Resp: 18  Temp: 99 F (37.2 C)  TempSrc: Oral  SpO2: 100%     General appearance: alert, cooperative, appears stated age and no distress Lungs: unlabored respirations; speaks full sentences without difficulty Back: no  CVA tenderness; FROM at waist Abdomen: soft, non-tender Buttock: <1cm small area of skin thickening of left lower; no fluctuance; no drainage or discharge GU: deferred Skin: warm and dry Psychological: alert and cooperative; normal mood and affect.    Labs Reviewed  CERVICOVAGINAL ANCILLARY ONLY    Allergies  Allergen Reactions  . Iodides Anaphylaxis and Itching  . Morphine And Related Anaphylaxis and Itching  . Shellfish Allergy Anaphylaxis and Itching    Pt reports "watery eyes"  . Ultram [Tramadol Hcl] Hives    Hives and swollen lips     Past Medical History:  Diagnosis Date  . Allergy   . Anemia 2015  . Asthma    as child  . Diabetes mellitus without complication ([facility]) 2015  . Eczema   . Hypertension 09/2013   Family History  Problem Relation Age of Onset  . Hypertension Mother   . Heart disease Mother 79       CHF  . Diabetes Mother   . Sickle cell anemia Brother   . Hypertension Maternal Grandfather   . Diabetes Maternal Grandfather   . Heart disease Maternal Grandfather   . Healthy Father   . Diabetes Maternal Grandmother   . Heart disease Maternal Grandmother    Social History   Socioeconomic History  . Marital status: Significant Other    Spouse name: Not on file  . Number of children: Not on file  . Years of education: Not on file  . Highest education level: Not on file  Occupational History  . Not on  file  Tobacco Use  . Smoking status: Former Smoker    Packs/day: 0.50    Years: 2.00    Pack years: 1.00    Quit date: 09/08/2013    Years since quitting: 6.4  . Smokeless tobacco: Never Used  Vaping Use  . Vaping Use: Never used  Substance and Sexual Activity  . Alcohol use: No    Alcohol/week: 0.0 standard drinks  . Drug use: No  . Sexual activity: Yes    Birth control/protection: None    Comment: 1st intercourse 71 yo--1 partner ( has been same partner 3 yrs)   Other Topics Concern  . Not on file  Social History Narrative   Works at  [employer] Strain:   . Difficulty of Paying Living Expenses:   Food Insecurity:   . Worried About Charity fundraiser in the Last Year:   . Arboriculturist in the Last Year:   Transportation Needs:   . Film/video editor (Medical):   Marland Kitchen Lack of Transportation (Non-Medical):   Physical Activity:   . Days of Exercise per Week:   . Minutes of Exercise per Session:   Stress:   . Feeling of Stress :   Social Connections:   . Frequency of Communication with Friends and Family:   . Frequency of Social Gatherings with Friends and Family:   . Attends Religious Services:   . Active Member of Clubs or Organizations:   . Attends Archivist Meetings:   Marland Kitchen Marital Status:   Intimate Partner Violence:   . Fear of Current or Ex-Partner:   . Emotionally Abused:   Marland Kitchen Physically Abused:   . Sexually Abused:           Vanessa Kick, MD 02/24/20 [place]    Vanessa Kick, MD 02/24/20 1051

## 2020-02-25 ENCOUNTER — Encounter: Payer: Self-pay | Admitting: Obstetrics and Gynecology

## 2020-02-25 ENCOUNTER — Emergency Department (HOSPITAL_COMMUNITY): Payer: 59

## 2020-02-25 ENCOUNTER — Encounter: Payer: Self-pay | Admitting: *Deleted

## 2020-02-25 ENCOUNTER — Emergency Department (HOSPITAL_COMMUNITY)
Admission: EM | Admit: 2020-02-25 | Discharge: 2020-02-25 | Disposition: A | Discharge status: Home / Self Care | Payer: 59 | Attending: Emergency Medicine | Admitting: Emergency Medicine

## 2020-02-25 ENCOUNTER — Encounter (HOSPITAL_COMMUNITY): Payer: Self-pay | Admitting: Emergency Medicine

## 2020-02-25 DIAGNOSIS — Z886 Allergy status to analgesic agent status: Secondary | ICD-10-CM | POA: Diagnosis not present

## 2020-02-25 DIAGNOSIS — Z79899 Other long term (current) drug therapy: Secondary | ICD-10-CM | POA: Insufficient documentation

## 2020-02-25 DIAGNOSIS — Z7984 Long term (current) use of oral hypoglycemic drugs: Secondary | ICD-10-CM | POA: Insufficient documentation

## 2020-02-25 DIAGNOSIS — Z885 Allergy status to narcotic agent status: Secondary | ICD-10-CM | POA: Insufficient documentation

## 2020-02-25 DIAGNOSIS — R739 Hyperglycemia, unspecified: Secondary | ICD-10-CM

## 2020-02-25 DIAGNOSIS — E1165 Type 2 diabetes mellitus with hyperglycemia: Secondary | ICD-10-CM | POA: Insufficient documentation

## 2020-02-25 DIAGNOSIS — R1032 Left lower quadrant pain: Secondary | ICD-10-CM

## 2020-02-25 DIAGNOSIS — I10 Essential (primary) hypertension: Secondary | ICD-10-CM | POA: Diagnosis not present

## 2020-02-25 DIAGNOSIS — Z87891 Personal history of nicotine dependence: Secondary | ICD-10-CM | POA: Insufficient documentation

## 2020-02-25 DIAGNOSIS — Z0289 Encounter for other administrative examinations: Secondary | ICD-10-CM

## 2020-02-25 DIAGNOSIS — J45909 Unspecified asthma, uncomplicated: Secondary | ICD-10-CM | POA: Diagnosis not present

## 2020-02-25 LAB — CERVICOVAGINAL ANCILLARY ONLY
Bacterial Vaginitis (gardnerella): POSITIVE — AB
Candida Glabrata: NEGATIVE
Candida Vaginitis: NEGATIVE
Chlamydia: NEGATIVE
Comment: NEGATIVE
Comment: NEGATIVE
Comment: NEGATIVE
Comment: NEGATIVE
Comment: NEGATIVE
Comment: NORMAL
Neisseria Gonorrhea: NEGATIVE
Trichomonas: NEGATIVE

## 2020-02-25 LAB — CBC
HCT: 39.7 % (ref 36.0–46.0)
Hemoglobin: 13.2 g/dL (ref 12.0–15.0)
MCH: 31.5 pg (ref 26.0–34.0)
MCHC: 33.2 g/dL (ref 30.0–36.0)
MCV: 94.7 fL (ref 80.0–100.0)
Platelets: 287 10*3/uL (ref 150–400)
RBC: 4.19 MIL/uL (ref 3.87–5.11)
RDW: 11.6 % (ref 11.5–15.5)
WBC: 11.7 10*3/uL — ABNORMAL HIGH (ref 4.0–10.5)
nRBC: 0 % (ref 0.0–0.2)

## 2020-02-25 LAB — COMPREHENSIVE METABOLIC PANEL
ALT: 13 U/L (ref 0–44)
AST: 12 U/L — ABNORMAL LOW (ref 15–41)
Albumin: 4.2 g/dL (ref 3.5–5.0)
Alkaline Phosphatase: 89 U/L (ref 38–126)
Anion gap: 10 (ref 5–15)
BUN: 7 mg/dL (ref 6–20)
CO2: 26 mmol/L (ref 22–32)
Calcium: 9 mg/dL (ref 8.9–10.3)
Chloride: 97 mmol/L — ABNORMAL LOW (ref 98–111)
Creatinine, Ser: 0.57 mg/dL (ref 0.44–1.00)
GFR calc Af Amer: >60 mL/min (ref >60)
GFR calc non Af Amer: >60 mL/min (ref >60)
Glucose, Bld: 294 mg/dL — ABNORMAL HIGH (ref 70–99)
Potassium: 4.3 mmol/L (ref 3.5–5.1)
Sodium: 133 mmol/L — ABNORMAL LOW (ref 135–145)
Total Bilirubin: 0.9 mg/dL (ref 0.3–1.2)
Total Protein: 7.8 g/dL (ref 6.5–8.1)

## 2020-02-25 LAB — URINALYSIS, ROUTINE W REFLEX MICROSCOPIC
Bacteria, UA: NONE SEEN
Bilirubin Urine: NEGATIVE
Glucose, UA: >=500 mg/dL — AB
Hgb urine dipstick: NEGATIVE
Ketones, ur: 80 mg/dL — AB
Leukocytes,Ua: NEGATIVE
Nitrite: NEGATIVE
Protein, ur: NEGATIVE mg/dL
Specific Gravity, Urine: >1.046 — ABNORMAL HIGH (ref 1.005–1.030)
pH: 6 (ref 5.0–8.0)

## 2020-02-25 LAB — I-STAT BETA HCG BLOOD, ED (MC, WL, AP ONLY): I-stat hCG, quantitative: <5 m[IU]/mL (ref <5)

## 2020-02-25 LAB — CBG MONITORING, ED
Glucose-Capillary: 270 mg/dL — ABNORMAL HIGH (ref 70–99)
Glucose-Capillary: 243 mg/dL — ABNORMAL HIGH (ref 70–99)

## 2020-02-25 LAB — LIPASE, BLOOD: Lipase: 21 U/L (ref 11–51)

## 2020-02-25 MED ORDER — SODIUM CHLORIDE 0.9 % IV BOLUS (SEPSIS)
1000.0000 mL | Freq: Once | INTRAVENOUS | Status: AC
  Administered 2020-02-25: 1000 mL via INTRAVENOUS

## 2020-02-25 MED ORDER — TRAMADOL HCL 50 MG PO TABS
50.0000 mg | ORAL_TABLET | Freq: Once | ORAL | Status: AC
  Administered 2020-02-25: 50 mg via ORAL
  Filled 2020-02-25: qty 1

## 2020-02-25 MED ORDER — SODIUM CHLORIDE (PF) 0.9 % IJ SOLN
INTRAMUSCULAR | Status: AC
  Filled 2020-02-25: qty 50

## 2020-02-25 MED ORDER — KETOROLAC TROMETHAMINE 15 MG/ML IJ SOLN
15.0000 mg | Freq: Once | INTRAMUSCULAR | Status: AC
  Administered 2020-02-25: 15 mg via INTRAVENOUS
  Filled 2020-02-25: qty 1

## 2020-02-25 MED ORDER — SODIUM CHLORIDE 0.9% FLUSH: 3.0000 mL | Freq: Once | INTRAVENOUS | Status: DC

## 2020-02-25 MED ORDER — ONDANSETRON HCL 4 MG PO TABS
4.0000 mg | ORAL_TABLET | Freq: Four times a day (QID) | ORAL | 0 refills | Status: DC
Start: 2020-02-25 — End: 2020-07-02

## 2020-02-25 MED ORDER — DOXYCYCLINE HYCLATE 100 MG PO TABS
100.0000 mg | ORAL_TABLET | Freq: Once | ORAL | Status: AC
  Administered 2020-02-25: 100 mg via ORAL
  Filled 2020-02-25: qty 1

## 2020-02-25 MED ORDER — NAPROXEN 250 MG PO TABS: 250.0000 mg | ORAL_TABLET | Freq: Two times a day (BID) | ORAL | 0 refills | Status: AC

## 2020-02-25 MED ORDER — IOHEXOL 300 MG/ML  SOLN
100.0000 mL | Freq: Once | INTRAMUSCULAR | Status: AC | PRN
  Administered 2020-02-25: 100 mL via INTRAVENOUS

## 2020-02-25 MED ORDER — ONDANSETRON HCL 4 MG/2ML IJ SOLN
4.0000 mg | Freq: Once | INTRAMUSCULAR | Status: AC
  Administered 2020-02-25: 4 mg via INTRAVENOUS
  Filled 2020-02-25: qty 2

## 2020-02-25 NOTE — Discharge Instructions (Addendum)
You are seen today for abdominal pain and elevated glucose..  Your work-up was overall reassuring.  You are treated for pain and dehydration and elevated sugar.  Your CT scan was reassuring.  Your ultrasound showed you have bilateral cyst on your ovaries.  Please make sure you follow-up with OB/GYN.  It is also important that you continue taking your Metformin as prescribed as well as her doxycycline as prescribed without missing doses.  I was prescribed you medication for pain as well.  You may return to the emergency department if you have any new or worsening symptoms.

## 2020-02-25 NOTE — Progress Notes (Signed)
Repetitive no shows and late cancellations reason for discharge. Detar North CMA Practice Admin

## 2020-02-25 NOTE — ED Notes (Signed)
Pt. Made aware for the need of urine specimen. 

## 2020-02-25 NOTE — ED Provider Notes (Signed)
[facility] DEPT Provider Note   CSN: [id number] Arrival date & time: 02/25/20  1222     History Chief Complaint  Patient presents with  . Nausea  . Hyperglycemia    Misty [name] is a 29 y.o. female.  Patient is a 29 year old female with past medical history of anemia, asthma, hypertension, diabetes who presents emergency department for abdominal pain and hyperglycemia.  Patient reports that today she began to have left lower quadrant abdominal pain and felt nauseated.  She reports that this prompted to check her blood sugar and it was in the 300s.  She reports that she does not regularly check her blood sugar, "only when I go to the doctors".  She reports that she takes Metformin 1000 mg twice daily but that she has missed a few doses lately.  She denies any diarrhea, fever, dysuria, vaginal bleeding or vaginal discharge.        Past Medical History:  Diagnosis Date  . Allergy   . Anemia 2015  . Asthma    as child  . Diabetes mellitus without complication ([place]) 4097  . Eczema   . Hypertension 09/2013    There are no problems to display for this patient.   Past Surgical History:  Procedure Laterality Date  . Abcess on back       OB History    Gravida  1   Para  0   Term  0   Preterm  0   AB  1   Living  0     SAB  1   TAB  0   Ectopic  0   Multiple  0   Live Births              Family History  Problem Relation Age of Onset  . Hypertension Mother   . Heart disease Mother 71       CHF  . Diabetes Mother   . Sickle cell anemia Brother   . Hypertension Maternal Grandfather   . Diabetes Maternal Grandfather   . Heart disease Maternal Grandfather   . Healthy Father   . Diabetes Maternal Grandmother   . Heart disease Maternal Grandmother     Social History   Tobacco Use  . Smoking status: Former Smoker    Packs/day: 0.50    Years: 2.00    Pack years: 1.00    Quit date: 09/08/2013    Years since  quitting: 6.4  . Smokeless tobacco: Never Used  Vaping Use  . Vaping Use: Never used  Substance Use Topics  . Alcohol use: No    Alcohol/week: 0.0 standard drinks  . Drug use: No    Home Medications Prior to Admission medications   Medication Sig Start Date End Date Taking? Authorizing Provider  amLODipine (NORVASC) 5 MG tablet Take 1 tablet (5 mg total) by mouth daily. 08/31/19  Yes Darr, Marguerita Beards, PA-C  ibuprofen (ADVIL) 800 MG tablet Take 1 tablet (800 mg total) by mouth every 8 (eight) hours as needed. 01/16/20  Yes Bast, Traci A, NP  metFORMIN (GLUCOPHAGE) 500 MG tablet Take 2 tablets (1,000 mg total) by mouth daily with breakfast. 01/14/19  Yes Blanchie Dessert, MD  blood glucose meter kit and supplies KIT Dispense based on patient and insurance preference. Use up to four times daily as directed. (FOR ICD-9 250.00, 250.01). 01/10/16   Hedges, Dellis Filbert, PA-C  cetirizine (ZYRTEC) 10 MG tablet Take 1 tablet (10 mg total) by mouth daily.  Patient not taking: Reported on 02/25/2020 01/16/20   Loura Halt A, NP  doxycycline (VIBRAMYCIN) 100 MG capsule Take 1 capsule (100 mg total) by mouth 2 (two) times daily. 02/24/20   Vanessa Kick, MD  naproxen (NAPROSYN) 250 MG tablet Take 1 tablet (250 mg total) by mouth 2 (two) times daily with a meal for 7 days. 02/25/20 03/03/20  Alveria Apley, PA-C  norelgestromin-ethinyl estradiol Marilu Favre) 150-35 MCG/24HR transdermal patch Apply one patch onto the skin once weekly as directed. Patient not taking: Reported on 02/25/2020 05/14/19   Fontaine, Belinda Block, MD  ondansetron (ZOFRAN) 4 MG tablet Take 1 tablet (4 mg total) by mouth every 6 (six) hours. 02/25/20   Alveria Apley, PA-C  dicyclomine (BENTYL) 20 MG tablet Take 1 tablet (20 mg total) by mouth 2 (two) times daily. 08/15/18 01/16/20  Recardo Evangelist, PA-C  fluticasone (FLONASE) 50 MCG/ACT nasal spray Place 2 sprays into both nostrils daily. 01/14/19 01/16/20  Blanchie Dessert, MD  ipratropium (ATROVENT)  0.06 % nasal spray Place 2 sprays into both nostrils 4 (four) times daily. 01/14/19 01/16/20  Blanchie Dessert, MD  omeprazole (PRILOSEC) 20 MG capsule Take 1 capsule (20 mg total) by mouth daily. 01/14/19 01/16/20  Blanchie Dessert, MD    Allergies    Iodides, Morphine and related, Shellfish allergy, and Ultram [tramadol hcl]  Review of Systems   Review of Systems  Constitutional: Positive for appetite change. Negative for fatigue and fever.  HENT: Negative for congestion and sore throat.   Respiratory: Negative for cough and shortness of breath.   Gastrointestinal: Positive for abdominal pain and nausea. Negative for vomiting.  Genitourinary: Negative for decreased urine volume, dysuria, vaginal bleeding and vaginal discharge.  Skin: Negative for rash.  Neurological: Negative for dizziness.  All other systems reviewed and are negative.   Physical Exam Updated Vital Signs BP (!) 144/94   Pulse 92   Temp 99.4 F (37.4 C) (Oral)   Resp 20   LMP 02/06/2020   SpO2 100%   Physical Exam Vitals and nursing note reviewed.  Constitutional:      General: She is not in acute distress.    Appearance: Normal appearance. She is obese. She is not ill-appearing, toxic-appearing or diaphoretic.  HENT:     Head: Normocephalic.     Mouth/Throat:     Mouth: Mucous membranes are moist.  Eyes:     Conjunctiva/sclera: Conjunctivae normal.  Cardiovascular:     Rate and Rhythm: Normal rate and regular rhythm.  Pulmonary:     Effort: Pulmonary effort is normal.  Abdominal:     General: Abdomen is flat.     Tenderness: There is abdominal tenderness in the left lower quadrant. There is guarding.  Skin:    General: Skin is warm and dry.  Neurological:     Mental Status: She is alert and oriented to person, place, and time.  Psychiatric:        Mood and Affect: Mood normal.     ED Results / Procedures / Treatments   Labs (all labs ordered are listed, but only abnormal results are  displayed) Labs Reviewed  COMPREHENSIVE METABOLIC PANEL - Abnormal; Notable for the following components:      Result Value   Sodium 133 (*)    Chloride 97 (*)    Glucose, Bld 294 (*)    AST 12 (*)    All other components within normal limits  CBC - Abnormal; Notable for the following components:  WBC 11.7 (*)    All other components within normal limits  URINALYSIS, ROUTINE W REFLEX MICROSCOPIC - Abnormal; Notable for the following components:   Specific Gravity, Urine >1.046 (*)    Glucose, UA >=500 (*)    Ketones, ur 80 (*)    All other components within normal limits  CBG MONITORING, ED - Abnormal; Notable for the following components:   Glucose-Capillary 270 (*)    All other components within normal limits  CBG MONITORING, ED - Abnormal; Notable for the following components:   Glucose-Capillary 243 (*)    All other components within normal limits  LIPASE, BLOOD  I-STAT BETA HCG BLOOD, ED ([facility], [facility], [facility] ONLY)    EKG None  Radiology US Transvaginal Non-OB  Result Date: 02/25/2020 CLINICAL DATA:  Two days of left lower quadrant pain EXAM: TRANSABDOMINAL AND TRANSVAGINAL ULTRASOUND OF PELVIS DOPPLER ULTRASOUND OF OVARIES TECHNIQUE: Both transabdominal and transvaginal ultrasound examinations of the pelvis were performed. Transabdominal technique was performed for global imaging of the pelvis including uterus, ovaries, adnexal regions, and pelvic cul-de-sac. It was necessary to proceed with endovaginal exam following the transabdominal exam to visualize the uterus, endometrium, and ovaries. Color and duplex Doppler ultrasound was utilized to evaluate blood flow to the ovaries. COMPARISON:  Same-day CT abdomen pelvis, ultrasound 09/18/2017 (images only) FINDINGS: Uterus Measurements: 7.8 x 3.8 x 4.2 cm = volume: 64.7 mL. Anteverted uterus. No concerning uterine masses or fibroids are visualized. Endometrium Thickness: 10.2 mm, within normal limits. No focal abnormality visualized. Right  ovary Measurements: 3.2 x 2.1 x 2.2 cm = volume: 7.9 mL. 1.5 x 1.2 x 1.3 cm thick-walled centrally cystic structure with some minimal internal echogenic signature and peripheral color flow likely reflect a corpus luteal cyst with perhaps a trace amount of internal hemorrhage or features of involution. No concerning right adnexal lesions. Left ovary Measurements: 3.3 x 2.0 x 2.8 cm = volume: 9.7 mL. There are 2 small hypoechoic foci in the left ovary the first measuring 1 x 0.8 x 1.2 cm and the second 1 x 0.7 x 0.8 cm. No concerning internal features or worrisome color flow, likely reflect follicles. Pulsed Doppler evaluation of both ovaries demonstrates normal low-resistance arterial and venous waveforms. Other findings No abnormal free fluid. IMPRESSION: Thick-walled cystic structure in the right adnexa with some internal complexity, likely to reflect an involuting or mildly hemorrhagic cyst. Cyst of this nature low 3 cm are a normal finding in premenopausal females. Cystic foci in the left ovary are more have a benign characteristics and are a common finding in premenopausal females. No imaging follow up is required. This follows consensus guidelines: Simple Adnexal Cysts: SRU Consensus Conference Update on Follow-up and Reporting. Radiology 2019; [id number]. Electronically Signed   By: Lovena Le M.D.   On: 02/25/2020 22:38   US Pelvis Complete  Result Date: 02/25/2020 CLINICAL DATA:  Two days of left lower quadrant pain EXAM: TRANSABDOMINAL AND TRANSVAGINAL ULTRASOUND OF PELVIS DOPPLER ULTRASOUND OF OVARIES TECHNIQUE: Both transabdominal and transvaginal ultrasound examinations of the pelvis were performed. Transabdominal technique was performed for global imaging of the pelvis including uterus, ovaries, adnexal regions, and pelvic cul-de-sac. It was necessary to proceed with endovaginal exam following the transabdominal exam to visualize the uterus, endometrium, and ovaries. Color and duplex Doppler  ultrasound was utilized to evaluate blood flow to the ovaries. COMPARISON:  Same-day CT abdomen pelvis, ultrasound 09/18/2017 (images only) FINDINGS: Uterus Measurements: 7.8 x 3.8 x 4.2 cm = volume: 64.7 mL. Anteverted uterus.  No concerning uterine masses or fibroids are visualized. Endometrium Thickness: 10.2 mm, within normal limits. No focal abnormality visualized. Right ovary Measurements: 3.2 x 2.1 x 2.2 cm = volume: 7.9 mL. 1.5 x 1.2 x 1.3 cm thick-walled centrally cystic structure with some minimal internal echogenic signature and peripheral color flow likely reflect a corpus luteal cyst with perhaps a trace amount of internal hemorrhage or features of involution. No concerning right adnexal lesions. Left ovary Measurements: 3.3 x 2.0 x 2.8 cm = volume: 9.7 mL. There are 2 small hypoechoic foci in the left ovary the first measuring 1 x 0.8 x 1.2 cm and the second 1 x 0.7 x 0.8 cm. No concerning internal features or worrisome color flow, likely reflect follicles. Pulsed Doppler evaluation of both ovaries demonstrates normal low-resistance arterial and venous waveforms. Other findings No abnormal free fluid. IMPRESSION: Thick-walled cystic structure in the right adnexa with some internal complexity, likely to reflect an involuting or mildly hemorrhagic cyst. Cyst of this nature low 3 cm are a normal finding in premenopausal females. Cystic foci in the left ovary are more have a benign characteristics and are a common finding in premenopausal females. No imaging follow up is required. This follows consensus guidelines: Simple Adnexal Cysts: SRU Consensus Conference Update on Follow-up and Reporting. Radiology 2019; [id number]. Electronically Signed   By: Lovena Le M.D.   On: 02/25/2020 22:38   CT ABDOMEN PELVIS W CONTRAST  Result Date: 02/25/2020 CLINICAL DATA:  Left lower quadrant abdominal pain.  Nausea. EXAM: CT ABDOMEN AND PELVIS WITH CONTRAST TECHNIQUE: Multidetector CT imaging of the abdomen and  pelvis was performed using the standard protocol following bolus administration of intravenous contrast. CONTRAST:  127m OMNIPAQUE IOHEXOL 300 MG/ML  SOLN COMPARISON:  Abdominal ultrasound dated July 28, 2016. FINDINGS: Lower chest: No acute abnormality. Hepatobiliary: No focal liver abnormality is seen. No gallstones, gallbladder wall thickening, or biliary dilatation. Pancreas: Unremarkable. No pancreatic ductal dilatation or surrounding inflammatory changes. Spleen: Normal in size without focal abnormality. Adrenals/Urinary Tract: Adrenal glands are unremarkable. Kidneys are normal, without focal lesion or hydronephrosis. Early excretion of contrast limits evaluation for renal calculi. Mild circumferential bladder wall thickening. Stomach/Bowel: Stomach is within normal limits. Appendix is not identified but there are no signs of inflammation at the base of the cecum. No evidence of bowel wall thickening, distention, or inflammatory changes. Vascular/Lymphatic: No significant vascular findings. Mildly enlarged left inguinal lymph nodes measuring up to 1.3 cm in short axis. No additional enlarged lymph nodes in the abdomen or pelvis. Reproductive: Uterus and bilateral adnexa are unremarkable. Other: No abdominal wall hernia or abnormality. No abdominopelvic ascites. No pneumoperitoneum. Musculoskeletal: No acute or significant osseous findings. Severe facet arthropathy at L5-S1. Prominent soft tissue swelling in the medial left buttock with focal area of phlegmon but no fluid collection (series 2, image 89). IMPRESSION: 1. Prominent soft tissue swelling in the medial left buttock, suspicious for cellulitis. Correlate with physical exam. No abscess. 2. Mildly enlarged left inguinal lymph nodes, likely reactive. 3. Mild circumferential bladder wall thickening. Correlate with urinalysis to exclude cystitis. Electronically Signed   By: WTitus DubinM.D.   On: 02/25/2020 18:40   [place]/Ven Flow Abd Pelv  Doppler  Result Date: 02/25/2020 CLINICAL DATA:  Two days of left lower quadrant pain EXAM: TRANSABDOMINAL AND TRANSVAGINAL ULTRASOUND OF PELVIS DOPPLER ULTRASOUND OF OVARIES TECHNIQUE: Both transabdominal and transvaginal ultrasound examinations of the pelvis were performed. Transabdominal technique was performed for global imaging of the pelvis including uterus,  ovaries, adnexal regions, and pelvic cul-de-sac. It was necessary to proceed with endovaginal exam following the transabdominal exam to visualize the uterus, endometrium, and ovaries. Color and duplex Doppler ultrasound was utilized to evaluate blood flow to the ovaries. COMPARISON:  Same-day CT abdomen pelvis, ultrasound 09/18/2017 (images only) FINDINGS: Uterus Measurements: 7.8 x 3.8 x 4.2 cm = volume: 64.7 mL. Anteverted uterus. No concerning uterine masses or fibroids are visualized. Endometrium Thickness: 10.2 mm, within normal limits. No focal abnormality visualized. Right ovary Measurements: 3.2 x 2.1 x 2.2 cm = volume: 7.9 mL. 1.5 x 1.2 x 1.3 cm thick-walled centrally cystic structure with some minimal internal echogenic signature and peripheral color flow likely reflect a corpus luteal cyst with perhaps a trace amount of internal hemorrhage or features of involution. No concerning right adnexal lesions. Left ovary Measurements: 3.3 x 2.0 x 2.8 cm = volume: 9.7 mL. There are 2 small hypoechoic foci in the left ovary the first measuring 1 x 0.8 x 1.2 cm and the second 1 x 0.7 x 0.8 cm. No concerning internal features or worrisome color flow, likely reflect follicles. Pulsed Doppler evaluation of both ovaries demonstrates normal low-resistance arterial and venous waveforms. Other findings No abnormal free fluid. IMPRESSION: Thick-walled cystic structure in the right adnexa with some internal complexity, likely to reflect an involuting or mildly hemorrhagic cyst. Cyst of this nature low 3 cm are a normal finding in premenopausal females. Cystic  foci in the left ovary are more have a benign characteristics and are a common finding in premenopausal females. No imaging follow up is required. This follows consensus guidelines: Simple Adnexal Cysts: SRU Consensus Conference Update on Follow-up and Reporting. Radiology 2019; [id number]. Electronically Signed   By: Lovena Le M.D.   On: 02/25/2020 22:38    Procedures Procedures (including critical care time)  Medications Ordered in ED Medications  sodium chloride (PF) 0.9 % injection (has no administration in time range)  ondansetron (ZOFRAN) injection 4 mg (4 mg Intravenous Given 02/25/20 1845)  sodium chloride 0.9 % bolus 1,000 mL (0 mLs Intravenous Stopped 02/25/20 2015)  ketorolac (TORADOL) 15 MG/ML injection 15 mg (15 mg Intravenous Given 02/25/20 1846)  iohexol (OMNIPAQUE) 300 MG/ML solution 100 mL (100 mLs Intravenous Contrast Given 02/25/20 1817)  doxycycline (VIBRA-TABS) tablet 100 mg (100 mg Oral Given 02/25/20 2050)  sodium chloride 0.9 % bolus 1,000 mL (0 mLs Intravenous Stopped 02/25/20 2224)  traMADol (ULTRAM) tablet 50 mg (50 mg Oral Given 02/25/20 2224)    ED Course  I have reviewed the triage vital signs and the nursing notes.  Pertinent labs & imaging results that were available during my care of the patient were reviewed by me and considered in my medical decision making (see chart for details).  Clinical Course as of Feb 24 2318  Thu Feb 25, 2020  1804 Patient presenting with hyperglycemia and left lower quadrant pain.  On arrival, she had blood sugar to 294.  She does not usually check her blood glucose and has not been compliant with her Metformin.  Likely, her blood sugars are usually elevated to this degree.  She is not in DKA.  She had a very mild leukocytosis to 11.7 and was tearful and appeared in pain due to left lower quadrant pain with tenderness and guarding on my exam.  For this reason a CT of her abdomen and pelvis was performed.  She denies any vaginal  bleeding vaginal discharge or dysuria.  I ordered Toradol, Zofran, fluids for her  symptoms.  Awaiting CT scan at this time   [KM]  1933 CT scan without acute, emergent finding. She does have signs of a cellulitis on her L buttock. She was seen for this in urgent care yesterday and started on doxy. Also bladder thickening on CT. Patient not having urinary/vaginal symptoms. Waiting on urinalysis. I did review her STD testing done in urgent care yesterday and it was negative for STD   [KM]  2038 Patient continued to have 9 mm 10 left lower quadrant abdominal pain.  No signs of infection on urinalysis.  Continues to deny dysuria, vaginal complaints.  She does report that the cellulitis on her buttock feels like it might be getting larger.  She reports that she has not picked up the doxycycline prescription from the urgent care center yet.  We will order her for antibiotics here as well as pelvic ultrasound given her continued severe pain   [KM]  2254 US showing bilateral benign appearing cystic lesions in the ovaries. Patient improved, wishing to go home. Will D/c with pain control and nausea medication and advised on good PMD and obgyn f/u as well as medication compliance   [KM]    Clinical Course User Index [KM] Kristine Royal   MDM Rules/Calculators/A&P                          Based on review of vitals, medical screening exam, lab work and/or imaging, there does not appear to be an acute, emergent etiology for the patient's symptoms. Counseled pt on good return precautions and encouraged both PCP and ED follow-up as needed.  Prior to discharge, I also discussed incidental imaging findings with patient in detail and advised appropriate, recommended follow-up in detail.  Clinical Impression: 1. Hyperglycemia   2. Left lower quadrant abdominal pain     Disposition: Discharge  Prior to providing a prescription for a controlled substance, I independently reviewed the patient's recent  prescription history on the [facility]. The patient had no recent or regular prescriptions and was deemed appropriate for a brief, less than 3 day prescription of narcotic for acute analgesia.  This note was prepared with assistance of Systems analyst. Occasional wrong-word or sound-a-like substitutions may have occurred due to the inherent limitations of voice recognition software.  Final Clinical Impression(s) / ED Diagnoses Final diagnoses:  Hyperglycemia  Left lower quadrant abdominal pain    Rx / DC Orders ED Discharge Orders         Ordered    naproxen (NAPROSYN) 250 MG tablet  2 times daily with meals     Discontinue  Reprint     02/25/20 2301    ondansetron (ZOFRAN) 4 MG tablet  Every 6 hours     Discontinue  Reprint     02/25/20 2301           Alveria Apley, PA-C 02/25/20 2319    Virgel Manifold, MD 02/26/20 2322

## 2020-02-25 NOTE — ED Triage Notes (Signed)
Per [facility] pt from home for nausea and CBG 357.  132/70, 98HT, 16R, 100% on RA, 97.6 temp.

## 2020-02-26 ENCOUNTER — Telehealth (HOSPITAL_COMMUNITY): Payer: Self-pay | Admitting: Orthopedic Surgery

## 2020-02-26 MED ORDER — METRONIDAZOLE 500 MG PO TABS
500.0000 mg | ORAL_TABLET | Freq: Two times a day (BID) | ORAL | 0 refills | Status: DC
Start: 2020-02-26 — End: 2020-07-02

## 2020-04-10 ENCOUNTER — Emergency Department (HOSPITAL_COMMUNITY): Payer: 59

## 2020-04-10 ENCOUNTER — Emergency Department (HOSPITAL_COMMUNITY)
Admission: EM | Admit: 2020-04-10 | Discharge: 2020-04-11 | Disposition: A | Discharge status: Home / Self Care | Payer: 59 | Attending: Emergency Medicine | Admitting: Emergency Medicine

## 2020-04-10 ENCOUNTER — Encounter (HOSPITAL_COMMUNITY): Payer: Self-pay

## 2020-04-10 DIAGNOSIS — Z87891 Personal history of nicotine dependence: Secondary | ICD-10-CM | POA: Diagnosis not present

## 2020-04-10 DIAGNOSIS — J45909 Unspecified asthma, uncomplicated: Secondary | ICD-10-CM | POA: Diagnosis not present

## 2020-04-10 DIAGNOSIS — I10 Essential (primary) hypertension: Secondary | ICD-10-CM | POA: Insufficient documentation

## 2020-04-10 DIAGNOSIS — Z79899 Other long term (current) drug therapy: Secondary | ICD-10-CM | POA: Diagnosis not present

## 2020-04-10 DIAGNOSIS — R05 Cough: Secondary | ICD-10-CM | POA: Diagnosis present

## 2020-04-10 DIAGNOSIS — E1165 Type 2 diabetes mellitus with hyperglycemia: Secondary | ICD-10-CM | POA: Insufficient documentation

## 2020-04-10 DIAGNOSIS — R739 Hyperglycemia, unspecified: Secondary | ICD-10-CM

## 2020-04-10 DIAGNOSIS — U071 COVID-19: Secondary | ICD-10-CM | POA: Insufficient documentation

## 2020-04-10 DIAGNOSIS — Z7984 Long term (current) use of oral hypoglycemic drugs: Secondary | ICD-10-CM | POA: Insufficient documentation

## 2020-04-10 LAB — I-STAT CHEM 8, ED
BUN: 7 mg/dL (ref 6–20)
Calcium, Ion: 1.1 mmol/L — ABNORMAL LOW (ref 1.15–1.40)
Chloride: 93 mmol/L — ABNORMAL LOW (ref 98–111)
Creatinine, Ser: 0.6 mg/dL (ref 0.44–1.00)
Glucose, Bld: 350 mg/dL — ABNORMAL HIGH (ref 70–99)
HCT: 43 % (ref 36.0–46.0)
Hemoglobin: 14.6 g/dL (ref 12.0–15.0)
Potassium: 3.3 mmol/L — ABNORMAL LOW (ref 3.5–5.1)
Sodium: 132 mmol/L — ABNORMAL LOW (ref 135–145)
TCO2: 26 mmol/L (ref 22–32)

## 2020-04-10 LAB — SARS CORONAVIRUS 2 BY RT PCR (HOSPITAL ORDER, PERFORMED IN ~~LOC~~ HOSPITAL LAB): SARS Coronavirus 2: POSITIVE — AB

## 2020-04-10 LAB — I-STAT BETA HCG BLOOD, ED (MC, WL, AP ONLY): I-stat hCG, quantitative: <5 m[IU]/mL (ref <5)

## 2020-04-10 MED ORDER — INSULIN ASPART 100 UNIT/ML ~~LOC~~ SOLN
10.0000 [IU] | Freq: Once | SUBCUTANEOUS | Status: AC
  Administered 2020-04-11: 10 [IU] via INTRAVENOUS

## 2020-04-10 MED ORDER — ONDANSETRON HCL 4 MG/2ML IJ SOLN
4.0000 mg | Freq: Once | INTRAMUSCULAR | Status: AC
  Administered 2020-04-10: 4 mg via INTRAVENOUS
  Filled 2020-04-10: qty 2

## 2020-04-10 MED ORDER — METHYLPREDNISOLONE SODIUM SUCC 125 MG IJ SOLR: 125.0000 mg | Freq: Once | INTRAMUSCULAR | Status: DC | PRN

## 2020-04-10 MED ORDER — SODIUM CHLORIDE 0.9 % IV SOLN
1200.0000 mg | Freq: Once | INTRAVENOUS | Status: AC
  Administered 2020-04-11: 1200 mg via INTRAVENOUS
  Filled 2020-04-10: qty 10

## 2020-04-10 MED ORDER — EPINEPHRINE 0.3 MG/0.3ML IJ SOAJ: 0.3000 mg | Freq: Once | INTRAMUSCULAR | Status: DC | PRN

## 2020-04-10 MED ORDER — FAMOTIDINE IN NACL 20-0.9 MG/50ML-% IV SOLN: 20.0000 mg | Freq: Once | INTRAVENOUS | Status: DC | PRN

## 2020-04-10 MED ORDER — ALBUTEROL SULFATE HFA 108 (90 BASE) MCG/ACT IN AERS: 2.0000 | INHALATION_SPRAY | Freq: Once | RESPIRATORY_TRACT | Status: DC | PRN

## 2020-04-10 MED ORDER — DIPHENHYDRAMINE HCL 50 MG/ML IJ SOLN: 50.0000 mg | Freq: Once | INTRAMUSCULAR | Status: DC | PRN

## 2020-04-10 MED ORDER — SODIUM CHLORIDE 0.9 % IV SOLN: INTRAVENOUS | Status: DC | PRN

## 2020-04-10 MED ORDER — KETOROLAC TROMETHAMINE 15 MG/ML IJ SOLN
15.0000 mg | Freq: Once | INTRAMUSCULAR | Status: AC
  Administered 2020-04-11: 15 mg via INTRAVENOUS
  Filled 2020-04-10: qty 1

## 2020-04-10 MED ORDER — SODIUM CHLORIDE 0.9 % IV BOLUS
1000.0000 mL | Freq: Once | INTRAVENOUS | Status: AC
  Administered 2020-04-10: 1000 mL via INTRAVENOUS

## 2020-04-10 NOTE — ED Provider Notes (Signed)
[facility] EMERGENCY DEPARTMENT Provider Note   CSN: [id number] Arrival date & time: 04/10/20  1739     History Chief Complaint  Patient presents with  . Cough    Misty [name] is a 29 y.o. female with history of diabetes who presents with not feeling well since last Monday. She reports a fever, headache "that is like a migraine", cough, nausea, loss of smell and taste. It has been gradually worsening and she has not been eating or drinking. She has decreased urination. She denies runny nose, congestion, sore throat, chest pain, SOB, abdominal pain, vomiting, diarrhea. She has several family members who were seen in the ED today who were tested negative for COVID.  HPI     Past Medical History:  Diagnosis Date  . Allergy   . Anemia 2015  . Asthma    as child  . Diabetes mellitus without complication ([place]) 7510  . Eczema   . Hypertension 09/2013    There are no problems to display for this patient.   Past Surgical History:  Procedure Laterality Date  . Abcess on back       OB History    Gravida  1   Para  0   Term  0   Preterm  0   AB  1   Living  0     SAB  1   TAB  0   Ectopic  0   Multiple  0   Live Births              Family History  Problem Relation Age of Onset  . Hypertension Mother   . Heart disease Mother 46       CHF  . Diabetes Mother   . Sickle cell anemia Brother   . Hypertension Maternal Grandfather   . Diabetes Maternal Grandfather   . Heart disease Maternal Grandfather   . Healthy Father   . Diabetes Maternal Grandmother   . Heart disease Maternal Grandmother     Social History   Tobacco Use  . Smoking status: Former Smoker    Packs/day: 0.50    Years: 2.00    Pack years: 1.00    Quit date: 09/08/2013    Years since quitting: 6.5  . Smokeless tobacco: Never Used  Vaping Use  . Vaping Use: Never used  Substance Use Topics  . Alcohol use: No    Alcohol/week: 0.0 standard drinks  . Drug  use: No    Home Medications Prior to Admission medications   Medication Sig Start Date End Date Taking? Authorizing Provider  amLODipine (NORVASC) 5 MG tablet Take 1 tablet (5 mg total) by mouth daily. 08/31/19   Darr, Marguerita Beards, PA-C  blood glucose meter kit and supplies KIT Dispense based on patient and insurance preference. Use up to four times daily as directed. (FOR ICD-9 250.00, 250.01). 01/10/16   Hedges, Dellis Filbert, PA-C  cetirizine (ZYRTEC) 10 MG tablet Take 1 tablet (10 mg total) by mouth daily. Patient not taking: Reported on 02/25/2020 01/16/20   Loura Halt A, NP  doxycycline (VIBRAMYCIN) 100 MG capsule Take 1 capsule (100 mg total) by mouth 2 (two) times daily. 02/24/20   Vanessa Kick, MD  ibuprofen (ADVIL) 800 MG tablet Take 1 tablet (800 mg total) by mouth every 8 (eight) hours as needed. 01/16/20   Loura Halt A, NP  metFORMIN (GLUCOPHAGE) 500 MG tablet Take 2 tablets (1,000 mg total) by mouth daily with breakfast. 01/14/19  Gwyneth Sprout, MD  metroNIDAZOLE (FLAGYL) 500 MG tablet Take 1 tablet (500 mg total) by mouth 2 (two) times daily. 02/26/20   LampteyBritta Mccreedy, MD  norelgestromin-ethinyl estradiol Burr Medico) 150-35 MCG/24HR transdermal patch Apply one patch onto the skin once weekly as directed. Patient not taking: Reported on 02/25/2020 05/14/19   Fontaine, Nadyne Coombes, MD  ondansetron (ZOFRAN) 4 MG tablet Take 1 tablet (4 mg total) by mouth every 6 (six) hours. 02/25/20   Arlyn Dunning, PA-C  dicyclomine (BENTYL) 20 MG tablet Take 1 tablet (20 mg total) by mouth 2 (two) times daily. 08/15/18 01/16/20  Bethel Born, PA-C  fluticasone (FLONASE) 50 MCG/ACT nasal spray Place 2 sprays into both nostrils daily. 01/14/19 01/16/20  Gwyneth Sprout, MD  ipratropium (ATROVENT) 0.06 % nasal spray Place 2 sprays into both nostrils 4 (four) times daily. 01/14/19 01/16/20  Gwyneth Sprout, MD  omeprazole (PRILOSEC) 20 MG capsule Take 1 capsule (20 mg total) by mouth daily. 01/14/19 01/16/20   Gwyneth Sprout, MD    Allergies    Iodides, Morphine and related, Shellfish allergy, and Ultram [tramadol hcl]  Review of Systems   Review of Systems  Constitutional: Positive for activity change, appetite change, chills and fever.  HENT: Negative for congestion and sore throat.        +anosmia  Respiratory: Positive for cough. Negative for shortness of breath.   Cardiovascular: Negative for chest pain.  Gastrointestinal: Positive for nausea. Negative for diarrhea and vomiting.  Genitourinary: Negative for dysuria.  Neurological: Positive for headaches.  All other systems reviewed and are negative.   Physical Exam Updated Vital Signs BP 117/80 (BP Location: Right Arm)   Pulse (!) 103   Temp 99.6 F (37.6 C) (Oral)   Resp 16   LMP 04/03/2020   SpO2 96%   Physical Exam Vitals and nursing note reviewed.  Constitutional:      General: She is not in acute distress.    Appearance: Normal appearance. She is well-developed. She is not ill-appearing.  HENT:     Head: Normocephalic and atraumatic.     Right Ear: Tympanic membrane normal.     Left Ear: Tympanic membrane normal.     Nose: Nose normal.     Mouth/Throat:     Mouth: Mucous membranes are dry.  Eyes:     General: No scleral icterus.       Right eye: No discharge.        Left eye: No discharge.     Conjunctiva/sclera: Conjunctivae normal.     Pupils: Pupils are equal, round, and reactive to light.  Cardiovascular:     Rate and Rhythm: Tachycardia present.  Pulmonary:     Effort: Pulmonary effort is normal. No respiratory distress.     Breath sounds: Normal breath sounds.  Abdominal:     General: There is no distension.     Palpations: Abdomen is soft.     Tenderness: There is no abdominal tenderness.  Musculoskeletal:     Cervical back: Normal range of motion.  Skin:    General: Skin is warm and dry.  Neurological:     Mental Status: She is alert and oriented to person, place, and time.  Psychiatric:         Behavior: Behavior normal.     ED Results / Procedures / Treatments   Labs (all labs ordered are listed, but only abnormal results are displayed) Labs Reviewed  SARS CORONAVIRUS 2 BY RT PCR [facility] ORDER, PERFORMED IN  [place] [facility]) - Abnormal; Notable for the following components:      Result Value   SARS Coronavirus 2 POSITIVE (*)    All other components within normal limits  I-STAT CHEM 8, ED - Abnormal; Notable for the following components:   Sodium 132 (*)    Potassium 3.3 (*)    Chloride 93 (*)    Glucose, Bld 350 (*)    Calcium, Ion 1.10 (*)    All other components within normal limits  I-STAT BETA HCG BLOOD, ED ([facility], [facility], [facility] ONLY)    EKG None  Radiology DG Chest Port 1 View  Result Date: 04/10/2020 CLINICAL DATA:  Fever, weakness and cough. EXAM: PORTABLE CHEST 1 VIEW COMPARISON:  May 05, 2019 FINDINGS: Mild areas of atelectasis and/or early infiltrate are seen within the bilateral lung bases, left greater than right. There is no evidence of a pleural effusion or pneumothorax. The heart size and mediastinal contours are within normal limits. The visualized skeletal structures are unremarkable. IMPRESSION: Mild bibasilar atelectasis and/or early infiltrate, left greater than right. Electronically Signed   By: Virgina Norfolk M.D.   On: 04/10/2020 22:53    Procedures Procedures (including critical care time)  Medications Ordered in ED Medications  casirivimab-imdevimab (REGEN-COV) 1,200 mg in sodium chloride 0.9 % 110 mL IVPB (has no administration in time range)  0.9 %  sodium chloride infusion (has no administration in time range)  diphenhydrAMINE (BENADRYL) injection 50 mg (has no administration in time range)  famotidine (PEPCID) IVPB 20 mg premix (has no administration in time range)  albuterol (VENTOLIN HFA) 108 (90 Base) MCG/ACT inhaler 2 puff (has no administration in time range)  EPINEPHrine (EPI-PEN) injection 0.3 mg (has no  administration in time range)  sodium chloride 0.9 % bolus 1,000 mL (1,000 mLs Intravenous New Bag/Given 04/10/20 2257)  ondansetron (ZOFRAN) injection 4 mg (4 mg Intravenous Given 04/10/20 2257)  ketorolac (TORADOL) 15 MG/ML injection 15 mg (15 mg Intravenous Given 04/11/20 0003)  insulin aspart (novoLOG) injection 10 Units (10 Units Intravenous Given 04/11/20 0003)    ED Course  I have reviewed the triage vital signs and the nursing notes.  Pertinent labs & imaging results that were available during my care of the patient were reviewed by me and considered in my medical decision making (see chart for details).  29 year old presents with fever, headache, cough. She is tachycardic but otherwise vitals are normal and she is not significantly ill appearing. She does appear dehydrated on exam. Will obtain COVID, CXR, I-stat chem 8 and give fluids, zofran, toradol.  COVID is positive. CXR shows possible early pneumonia but she is satting at 100% on RA. Discussed results with the patient. Due to hx of diabetes she was offered MAB and she would like to proceed with this. Her labs show hyperglycemia (350). Will give insulin bolus as well. After she receives infusion she can be discharged home with supportive care. Care signed out to L Mathews Argyle  MDM Rules/Calculators/A&P                           Final Clinical Impression(s) / ED Diagnoses Final diagnoses:  COVID-19  Hyperglycemia    Rx / DC Orders ED Discharge Orders    None       Recardo Evangelist, PA-C 04/11/20 0020    Fatima Blank, MD 04/11/20 [phone]

## 2020-04-10 NOTE — ED Triage Notes (Signed)
Onset 04-04-20 decreased appetite, headache, weakness, and cough.  Two family members were seen today and tested for COVID.  EMS BP 132/82 HR 110 SpO2 96% RR 20 Temp 100.1  CBG 300

## 2020-04-11 ENCOUNTER — Telehealth: Payer: Self-pay | Admitting: Nurse Practitioner

## 2020-04-11 NOTE — Discharge Instructions (Signed)
You were diagnosed with covid-19 today. Please make sure to rest, stay hydrated, quarantine away from others as much as possible. Can continue tylenol if needed for fever/body aches. Follow-up with your primary care doctor. Return here for new concerns.

## 2020-04-11 NOTE — Telephone Encounter (Signed)
Called to discuss with Misty [name] about Covid symptoms and the use of casirivimab/imdevimab, a combination monoclonal antibody infusion for those with mild to moderate Covid symptoms and at a high risk of hospitalization.     Pt is qualified for this infusion at the [facility] infusion center due to co-morbid conditions and/or a member of an at-risk group, however declines infusion at this time. Reports her symptoms are resolving and she feels much better today.   Symptom onset 04/04/20.   Patient verbalized understanding of infusion and confirms wishes to decline.   Willette Alma, AGPCNP-BC

## 2020-04-16 ENCOUNTER — Ambulatory Visit (HOSPITAL_COMMUNITY)
Admission: EM | Admit: 2020-04-16 | Discharge: 2020-04-16 | Disposition: A | Discharge status: Home / Self Care | Payer: 59

## 2020-07-02 ENCOUNTER — Other Ambulatory Visit: Payer: Self-pay

## 2020-07-02 ENCOUNTER — Ambulatory Visit (HOSPITAL_COMMUNITY)
Admission: EM | Admit: 2020-07-02 | Discharge: 2020-07-02 | Disposition: A | Discharge status: Home / Self Care | Payer: 59 | Attending: Internal Medicine | Admitting: Internal Medicine

## 2020-07-02 ENCOUNTER — Encounter (HOSPITAL_COMMUNITY): Payer: Self-pay | Admitting: *Deleted

## 2020-07-02 DIAGNOSIS — Z3202 Encounter for pregnancy test, result negative: Secondary | ICD-10-CM

## 2020-07-02 DIAGNOSIS — Z113 Encounter for screening for infections with a predominantly sexual mode of transmission: Secondary | ICD-10-CM | POA: Insufficient documentation

## 2020-07-02 DIAGNOSIS — Z20822 Contact with and (suspected) exposure to covid-19: Secondary | ICD-10-CM | POA: Diagnosis not present

## 2020-07-02 DIAGNOSIS — L089 Local infection of the skin and subcutaneous tissue, unspecified: Secondary | ICD-10-CM | POA: Diagnosis not present

## 2020-07-02 LAB — POC URINE PREG, ED: Preg Test, Ur: NEGATIVE

## 2020-07-02 MED ORDER — NAPROXEN 500 MG PO TABS
500.0000 mg | ORAL_TABLET | Freq: Two times a day (BID) | ORAL | 0 refills | Status: DC
Start: 2020-07-02 — End: 2020-07-12

## 2020-07-02 MED ORDER — DOXYCYCLINE HYCLATE 100 MG PO CAPS
100.0000 mg | ORAL_CAPSULE | Freq: Two times a day (BID) | ORAL | 0 refills | Status: DC
Start: 2020-07-02 — End: 2020-07-12

## 2020-07-02 NOTE — Discharge Instructions (Signed)
Begin doxycycline twice daily for the next 10 days-take with food Naprosyn as needed for pain Rest and fluids Covid test and STD screening pending-monitor my chart for results, we will only call if abnormal Follow-up if any symptoms not improving or worsening

## 2020-07-02 NOTE — ED Provider Notes (Signed)
[place]    CSN: [id number] Arrival date & time: 07/02/20  1414      History   Chief Complaint Chief Complaint  Patient presents with  . Recurrent Skin Infections  . Covid Exposure  . Fever    HPI Misty [name] is a 29 y.o. female history of DM type II, hypertension, presenting today for evaluation of fever, abscesses as well as COVID and STD screening.  Patient reports that today woke up with a fever of 100, took Tylenol prior to arrival.  She also reports that she has had recurrent small abscesses which have flared up over the past week.  Denies URI symptoms.  Denies any abdominal pain, urinary symptoms or vaginal discharge.  Denies pelvic pain.  HPI  Past Medical History:  Diagnosis Date  . Allergy   . Anemia 2015  . Asthma    as child  . Diabetes mellitus without complication ([place]) 4356  . Eczema   . Hypertension 09/2013    There are no problems to display for this patient.   Past Surgical History:  Procedure Laterality Date  . Abcess on back      OB History    Gravida  1   Para  0   Term  0   Preterm  0   AB  1   Living  0     SAB  1   TAB  0   Ectopic  0   Multiple  0   Live Births               Home Medications    Prior to Admission medications   Medication Sig Start Date End Date Taking? Authorizing Provider  amLODipine (NORVASC) 5 MG tablet Take 1 tablet (5 mg total) by mouth daily. 08/31/19  Yes Darr, Edison Nasuti, PA-C  blood glucose meter kit and supplies KIT Dispense based on patient and insurance preference. Use up to four times daily as directed. (FOR ICD-9 250.00, 250.01). 01/10/16   Hedges, Dellis Filbert, PA-C  doxycycline (VIBRAMYCIN) 100 MG capsule Take 1 capsule (100 mg total) by mouth 2 (two) times daily for 10 days. 07/02/20 07/12/20  Randle Shatzer C, PA-C  metFORMIN (GLUCOPHAGE) 500 MG tablet Take 2 tablets (1,000 mg total) by mouth daily with breakfast. 01/14/19   Blanchie Dessert, MD  naproxen (NAPROSYN) 500  MG tablet Take 1 tablet (500 mg total) by mouth 2 (two) times daily. 07/02/20   Marykay Mccleod C, PA-C  norelgestromin-ethinyl estradiol Marilu Favre) 150-35 MCG/24HR transdermal patch Apply one patch onto the skin once weekly as directed. Patient not taking: Reported on 02/25/2020 05/14/19   Fontaine, Belinda Block, MD  cetirizine (ZYRTEC) 10 MG tablet Take 1 tablet (10 mg total) by mouth daily. Patient not taking: Reported on 02/25/2020 01/16/20 07/02/20  Loura Halt A, NP  dicyclomine (BENTYL) 20 MG tablet Take 1 tablet (20 mg total) by mouth 2 (two) times daily. 08/15/18 01/16/20  Recardo Evangelist, PA-C  fluticasone (FLONASE) 50 MCG/ACT nasal spray Place 2 sprays into both nostrils daily. 01/14/19 01/16/20  Blanchie Dessert, MD  ipratropium (ATROVENT) 0.06 % nasal spray Place 2 sprays into both nostrils 4 (four) times daily. 01/14/19 01/16/20  Blanchie Dessert, MD  omeprazole (PRILOSEC) 20 MG capsule Take 1 capsule (20 mg total) by mouth daily. 01/14/19 01/16/20  Blanchie Dessert, MD    Family History Family History  Problem Relation Age of Onset  . Hypertension Mother   . Heart disease Mother 20  CHF  . Diabetes Mother   . Sickle cell anemia Brother   . Hypertension Maternal Grandfather   . Diabetes Maternal Grandfather   . Heart disease Maternal Grandfather   . Healthy Father   . Diabetes Maternal Grandmother   . Heart disease Maternal Grandmother     Social History Social History   Tobacco Use  . Smoking status: Former Smoker    Packs/day: 0.50    Years: 2.00    Pack years: 1.00    Quit date: 09/08/2013    Years since quitting: 6.8  . Smokeless tobacco: Never Used  Vaping Use  . Vaping Use: Never used  Substance Use Topics  . Alcohol use: No    Alcohol/week: 0.0 standard drinks  . Drug use: No     Allergies   Iodides, Morphine and related, Shellfish allergy, and Ultram [tramadol hcl]   Review of Systems Review of Systems  Constitutional: Positive for fever. Negative  for fatigue.  HENT: Negative for congestion, rhinorrhea and sore throat.   Eyes: Negative for visual disturbance.  Respiratory: Negative for cough and shortness of breath.   Cardiovascular: Negative for chest pain.  Gastrointestinal: Negative for abdominal pain, diarrhea, nausea and vomiting.  Genitourinary: Negative for dysuria, flank pain, genital sores, hematuria, menstrual problem, vaginal bleeding, vaginal discharge and vaginal pain.  Musculoskeletal: Negative for arthralgias, back pain and joint swelling.  Skin: Positive for color change. Negative for rash and wound.  Neurological: Negative for dizziness, syncope, weakness, light-headedness and headaches.     Physical Exam Triage Vital Signs ED Triage Vitals  Enc Vitals Group     BP 07/02/20 1444 (!) 148/80     Pulse Rate 07/02/20 1444 77     Resp 07/02/20 1444 18     Temp 07/02/20 1444 98.6 F (37 C)     Temp Source 07/02/20 1444 Oral     SpO2 07/02/20 1444 100 %     Weight 07/02/20 1445 174 lb (78.9 kg)     Height 07/02/20 1445 _0  (1.6 m)     Head Circumference --      Peak Flow --      Pain Score 07/02/20 1445 0     Pain Loc --      Pain Edu? --      Excl. in [place]? --    No data found.  Updated Vital Signs BP (!) 148/80 (BP Location: Right Arm)   Pulse 77   Temp 98.6 F (37 C) (Oral)   Resp 18   Ht _1  (1.6 m)   Wt 174 lb (78.9 kg)   LMP 06/18/2020   SpO2 100%   BMI 30.82 kg/m   Visual Acuity Right Eye Distance:   Left Eye Distance:   Bilateral Distance:    Right Eye Near:   Left Eye Near:    Bilateral Near:     Physical Exam Vitals and nursing note reviewed.  Constitutional:      Appearance: She is well-developed.     Comments: No acute distress  HENT:     Head: Normocephalic and atraumatic.     Nose: Nose normal.  Eyes:     Conjunctiva/sclera: Conjunctivae normal.  Cardiovascular:     Rate and Rhythm: Normal rate.  Pulmonary:     Effort: Pulmonary effort is normal. No respiratory  distress.  Abdominal:     General: There is no distension.  Musculoskeletal:        General: Normal range of motion.  Cervical back: Neck supple.  Skin:    General: Skin is warm and dry.     Comments: Left lower back with area of scabbing crusting and discoloration, minimal induration or fluctuance  Neurological:     Mental Status: She is alert and oriented to person, place, and time.      UC Treatments / Results  Labs (all labs ordered are listed, but only abnormal results are displayed) Labs Reviewed  SARS CORONAVIRUS 2 (TAT 6-24 HRS)  CERVICOVAGINAL ANCILLARY ONLY    EKG   Radiology No results found.  Procedures Procedures (including critical care time)  Medications Ordered in UC Medications - No data to display  Initial Impression / Assessment and Plan / UC Course  I have reviewed the triage vital signs and the nursing notes.  Pertinent labs & imaging results that were available during my care of the patient were reviewed by me and considered in my medical decision making (see chart for details).     1.  Early abscess/skin infection-covering with doxycycline x10 days, anti-inflammatories for pain  2.  STD screening-vaginal swab pending, currently asymptomatic  3.  Covid screening-fever today, no URI or GI symptoms; continue anti-inflammatories rest and fluids, monitor symptoms, symptomatic and supportive care  Discussed strict return precautions. Patient verbalized understanding and is agreeable with plan.  Final Clinical Impressions(s) / UC Diagnoses   Final diagnoses:  Recurrent infection of skin  Encounter for laboratory testing for COVID-19 virus  Screen for STD (sexually transmitted disease)     Discharge Instructions     Begin doxycycline twice daily for the next 10 days-take with food Naprosyn as needed for pain Rest and fluids Covid test and STD screening pending-monitor my chart for results, we will only call if abnormal Follow-up if  any symptoms not improving or worsening    ED Prescriptions    Medication Sig Dispense Auth. Provider   doxycycline (VIBRAMYCIN) 100 MG capsule Take 1 capsule (100 mg total) by mouth 2 (two) times daily for 10 days. 20 capsule Beryl Hornberger C, PA-C   naproxen (NAPROSYN) 500 MG tablet Take 1 tablet (500 mg total) by mouth 2 (two) times daily. 30 tablet Mckynzie Liwanag, [place] C, PA-C     PDMP not reviewed this encounter.   Janith Lima, [place] 07/02/20 [phone]

## 2020-07-02 NOTE — ED Triage Notes (Signed)
Pt reports she had a fever today of 100. Pt took tylenol for fever at home temp 98.6 during triage. Pt also has multiple skin areas that Pt calls abscess. Pt reports she has had the skin abscess before.

## 2020-07-03 LAB — SARS CORONAVIRUS 2 (TAT 6-24 HRS): SARS Coronavirus 2: NEGATIVE

## 2020-07-04 LAB — CERVICOVAGINAL ANCILLARY ONLY
Bacterial Vaginitis (gardnerella): POSITIVE — AB
Candida Glabrata: NEGATIVE
Candida Vaginitis: POSITIVE — AB
Chlamydia: NEGATIVE
Comment: NEGATIVE
Comment: NEGATIVE
Comment: NEGATIVE
Comment: NEGATIVE
Comment: NEGATIVE
Comment: NORMAL
Neisseria Gonorrhea: NEGATIVE
Trichomonas: NEGATIVE

## 2020-07-05 ENCOUNTER — Telehealth (HOSPITAL_COMMUNITY): Payer: Self-pay | Admitting: Emergency Medicine

## 2020-07-05 MED ORDER — FLUCONAZOLE 150 MG PO TABS
150.0000 mg | ORAL_TABLET | Freq: Once | ORAL | 0 refills | Status: AC
Start: 2020-07-05 — End: 2020-07-05

## 2020-07-05 MED ORDER — METRONIDAZOLE 500 MG PO TABS: 500.0000 mg | ORAL_TABLET | Freq: Two times a day (BID) | ORAL | 0 refills | Status: DC

## 2020-07-12 ENCOUNTER — Other Ambulatory Visit: Payer: Self-pay

## 2020-07-12 ENCOUNTER — Encounter (HOSPITAL_COMMUNITY): Payer: Self-pay | Admitting: Emergency Medicine

## 2020-07-12 ENCOUNTER — Ambulatory Visit (HOSPITAL_COMMUNITY)
Admission: EM | Admit: 2020-07-12 | Discharge: 2020-07-12 | Disposition: A | Discharge status: Home / Self Care | Payer: 59 | Attending: Family Medicine | Admitting: Family Medicine

## 2020-07-12 DIAGNOSIS — I10 Essential (primary) hypertension: Secondary | ICD-10-CM | POA: Diagnosis not present

## 2020-07-12 DIAGNOSIS — Z7251 High risk heterosexual behavior: Secondary | ICD-10-CM | POA: Insufficient documentation

## 2020-07-12 DIAGNOSIS — Z7689 Persons encountering health services in other specified circumstances: Secondary | ICD-10-CM | POA: Insufficient documentation

## 2020-07-12 DIAGNOSIS — E119 Type 2 diabetes mellitus without complications: Secondary | ICD-10-CM | POA: Insufficient documentation

## 2020-07-12 MED ORDER — METFORMIN HCL 500 MG PO TABS: 1000.0000 mg | ORAL_TABLET | Freq: Every day | ORAL | 0 refills | Status: DC

## 2020-07-12 MED ORDER — AMLODIPINE BESYLATE 5 MG PO TABS
5.0000 mg | ORAL_TABLET | Freq: Every day | ORAL | 0 refills | Status: DC
Start: 2020-07-12 — End: 2020-09-18

## 2020-07-12 NOTE — ED Triage Notes (Addendum)
Pt presents with abdominal cramping due to cycle and would also like STD testing and covid testing due to exposure. Pt also needs refill on BP medication and metformin.

## 2020-07-12 NOTE — ED Provider Notes (Signed)
[place]    CSN: [id number] Arrival date & time: 07/12/20  1624      History   Chief Complaint Chief Complaint  Patient presents with  . Abdominal Pain  . Exposure to STD  . Covid Exposure    HPI Misty [name] is a 29 y.o. female.   HPI  Patient states that she is having her menstrual cycle.  Mild abdominal cramping.  She states this usually responds to ibuprofen or Aleve She states that her boyfriend came to the clinic today and received a injection for possible STD.  She would like testing.  She is not having any vaginitis symptoms Patient states that she needs a refill of her BP medicine and her Metformin.  She has run out Patient is requesting Covid testing.  She had Covid in August.  She states that she was possibly exposed to Covid.  Again.  Was tested last week.  Is not having any symptoms.  I told her that additional testing was not indicated based on her exposure and symptoms  Past Medical History:  Diagnosis Date  . Allergy   . Anemia 2015  . Asthma    as child  . Diabetes mellitus without complication ([place]) 1191  . Eczema   . Hypertension 09/2013    There are no problems to display for this patient.   Past Surgical History:  Procedure Laterality Date  . Abcess on back      OB History    Gravida  1   Para  0   Term  0   Preterm  0   AB  1   Living  0     SAB  1   TAB  0   Ectopic  0   Multiple  0   Live Births               Home Medications    Prior to Admission medications   Medication Sig Start Date End Date Taking? Authorizing Provider  amLODipine (NORVASC) 5 MG tablet Take 1 tablet (5 mg total) by mouth daily. 07/12/20   Raylene Everts, MD  blood glucose meter kit and supplies KIT Dispense based on patient and insurance preference. Use up to four times daily as directed. (FOR ICD-9 250.00, 250.01). 01/10/16   Hedges, Dellis Filbert, PA-C  metFORMIN (GLUCOPHAGE) 500 MG tablet Take 2 tablets (1,000 mg total) by  mouth daily with breakfast. 07/12/20   Raylene Everts, MD  norelgestromin-ethinyl estradiol Marilu Favre) 150-35 MCG/24HR transdermal patch Apply one patch onto the skin once weekly as directed. Patient not taking: Reported on 02/25/2020 05/14/19   Fontaine, Belinda Block, MD  cetirizine (ZYRTEC) 10 MG tablet Take 1 tablet (10 mg total) by mouth daily. Patient not taking: Reported on 02/25/2020 01/16/20 07/02/20  Loura Halt A, NP  dicyclomine (BENTYL) 20 MG tablet Take 1 tablet (20 mg total) by mouth 2 (two) times daily. 08/15/18 01/16/20  Recardo Evangelist, PA-C  fluticasone (FLONASE) 50 MCG/ACT nasal spray Place 2 sprays into both nostrils daily. 01/14/19 01/16/20  Blanchie Dessert, MD  ipratropium (ATROVENT) 0.06 % nasal spray Place 2 sprays into both nostrils 4 (four) times daily. 01/14/19 01/16/20  Blanchie Dessert, MD  omeprazole (PRILOSEC) 20 MG capsule Take 1 capsule (20 mg total) by mouth daily. 01/14/19 01/16/20  Blanchie Dessert, MD    Family History Family History  Problem Relation Age of Onset  . Hypertension Mother   . Heart disease Mother 33  CHF  . Diabetes Mother   . Sickle cell anemia Brother   . Hypertension Maternal Grandfather   . Diabetes Maternal Grandfather   . Heart disease Maternal Grandfather   . Healthy Father   . Diabetes Maternal Grandmother   . Heart disease Maternal Grandmother     Social History Social History   Tobacco Use  . Smoking status: Former Smoker    Packs/day: 0.50    Years: 2.00    Pack years: 1.00    Quit date: 09/08/2013    Years since quitting: 6.8  . Smokeless tobacco: Never Used  Vaping Use  . Vaping Use: Never used  Substance Use Topics  . Alcohol use: No    Alcohol/week: 0.0 standard drinks  . Drug use: No     Allergies   Iodides, Morphine and related, Shellfish allergy, and Ultram [tramadol hcl]   Review of Systems Review of Systems  See HPI Physical Exam Triage Vital Signs ED Triage Vitals  Enc Vitals Group     BP  07/12/20 1802 138/88     Pulse Rate 07/12/20 1802 66     Resp 07/12/20 1802 17     Temp 07/12/20 1802 98 F (36.7 C)     Temp Source 07/12/20 1802 Oral     SpO2 07/12/20 1802 100 %     Weight --      Height --      Head Circumference --      Peak Flow --      Pain Score 07/12/20 1800 9     Pain Loc --      Pain Edu? --      Excl. in [place]? --    No data found.  Updated Vital Signs BP 138/88 (BP Location: Right Arm)   Pulse 66   Temp 98 F (36.7 C) (Oral)   Resp 17   LMP 07/10/2020   SpO2 100%      Physical Exam Constitutional:      General: She is not in acute distress.    Appearance: Normal appearance. She is well-developed.     Comments: Patient appears normal.  Exam is by observation  HENT:     Head: Normocephalic and atraumatic.  Eyes:     Conjunctiva/sclera: Conjunctivae normal.     Pupils: Pupils are equal, round, and reactive to light.  Cardiovascular:     Rate and Rhythm: Normal rate.  Pulmonary:     Effort: Pulmonary effort is normal. No respiratory distress.  Abdominal:     Palpations: Abdomen is soft.  Musculoskeletal:        General: Normal range of motion.     Cervical back: Normal range of motion.  Skin:    General: Skin is warm and dry.  Neurological:     Mental Status: She is alert.  Psychiatric:        Mood and Affect: Mood normal.        Behavior: Behavior normal.      UC Treatments / Results  Labs (all labs ordered are listed, but only abnormal results are displayed) Labs Reviewed  CERVICOVAGINAL ANCILLARY ONLY    EKG   Radiology No results found.  Procedures Procedures (including critical care time)  Medications Ordered in UC Medications - No data to display  Initial Impression / Assessment and Plan / UC Course  I have reviewed the triage vital signs and the nursing notes.  Pertinent labs & imaging results that were available during my care of  the patient were reviewed by me and considered in my medical decision making  (see chart for details).     ST D testing is performed Treatment will depend upon results Over-the-counter medicine recommended for menstrual cramps Baseline blood pressure and diabetes medicines are refilled for 1 month only.  PCP follow-up is recommended Covid testing is not done based on ill-defined exposure and no symptoms Final Clinical Impressions(s) / UC Diagnoses   Final diagnoses:  Unprotected sex  Encounter for assessment of STD exposure  Essential hypertension  Type 2 diabetes mellitus without complication, without long-term current use of insulin ([place])     Discharge Instructions     I have refilled your bp and diabetes medications for one month Call your PCP for an appointment to get future refills Check My Chart for test results I recommend getting the COVID vaccination    ED Prescriptions    Medication Sig Dispense Auth. Provider   amLODipine (NORVASC) 5 MG tablet Take 1 tablet (5 mg total) by mouth daily. 30 tablet Raylene Everts, MD   metFORMIN (GLUCOPHAGE) 500 MG tablet  (Status: Discontinued) Take 2 tablets (1,000 mg total) by mouth daily with breakfast. 60 tablet Raylene Everts, MD   metFORMIN (GLUCOPHAGE) 500 MG tablet Take 2 tablets (1,000 mg total) by mouth daily with breakfast. 60 tablet Raylene Everts, MD     PDMP not reviewed this encounter.   Raylene Everts, MD 07/12/20 2131

## 2020-07-12 NOTE — Discharge Instructions (Signed)
I have refilled your bp and diabetes medications for one month Call your PCP for an appointment to get future refills Check My Chart for test results I recommend getting the COVID vaccination

## 2020-07-13 ENCOUNTER — Encounter (HOSPITAL_COMMUNITY): Payer: Self-pay | Admitting: *Deleted

## 2020-07-13 ENCOUNTER — Other Ambulatory Visit: Payer: Self-pay

## 2020-07-13 ENCOUNTER — Emergency Department (HOSPITAL_COMMUNITY)
Admission: EM | Admit: 2020-07-13 | Discharge: 2020-07-13 | Disposition: A | Discharge status: Home / Self Care | Payer: 59 | Attending: Emergency Medicine | Admitting: Emergency Medicine

## 2020-07-13 DIAGNOSIS — R739 Hyperglycemia, unspecified: Secondary | ICD-10-CM

## 2020-07-13 DIAGNOSIS — Z7984 Long term (current) use of oral hypoglycemic drugs: Secondary | ICD-10-CM | POA: Insufficient documentation

## 2020-07-13 DIAGNOSIS — E1165 Type 2 diabetes mellitus with hyperglycemia: Secondary | ICD-10-CM | POA: Diagnosis not present

## 2020-07-13 DIAGNOSIS — R030 Elevated blood-pressure reading, without diagnosis of hypertension: Secondary | ICD-10-CM

## 2020-07-13 DIAGNOSIS — I10 Essential (primary) hypertension: Secondary | ICD-10-CM | POA: Diagnosis not present

## 2020-07-13 DIAGNOSIS — Z79899 Other long term (current) drug therapy: Secondary | ICD-10-CM | POA: Diagnosis not present

## 2020-07-13 DIAGNOSIS — Z87891 Personal history of nicotine dependence: Secondary | ICD-10-CM | POA: Insufficient documentation

## 2020-07-13 LAB — CBC
HCT: 40.9 % (ref 36.0–46.0)
Hemoglobin: 13.6 g/dL (ref 12.0–15.0)
MCH: 31.3 pg (ref 26.0–34.0)
MCHC: 33.3 g/dL (ref 30.0–36.0)
MCV: 94.2 fL (ref 80.0–100.0)
Platelets: 344 10*3/uL (ref 150–400)
RBC: 4.34 MIL/uL (ref 3.87–5.11)
RDW: 12.1 % (ref 11.5–15.5)
WBC: 6 10*3/uL (ref 4.0–10.5)
nRBC: 0 % (ref 0.0–0.2)

## 2020-07-13 LAB — COMPREHENSIVE METABOLIC PANEL
ALT: 23 U/L (ref 0–44)
AST: 22 U/L (ref 15–41)
Albumin: 4.4 g/dL (ref 3.5–5.0)
Alkaline Phosphatase: 80 U/L (ref 38–126)
Anion gap: 13 (ref 5–15)
BUN: 6 mg/dL (ref 6–20)
CO2: 23 mmol/L (ref 22–32)
Calcium: 9.5 mg/dL (ref 8.9–10.3)
Chloride: 99 mmol/L (ref 98–111)
Creatinine, Ser: 0.81 mg/dL (ref 0.44–1.00)
GFR, Estimated: >60 mL/min (ref >60)
Glucose, Bld: 267 mg/dL — ABNORMAL HIGH (ref 70–99)
Potassium: 4 mmol/L (ref 3.5–5.1)
Sodium: 135 mmol/L (ref 135–145)
Total Bilirubin: 1.1 mg/dL (ref 0.3–1.2)
Total Protein: 8.1 g/dL (ref 6.5–8.1)

## 2020-07-13 LAB — CBG MONITORING, ED: Glucose-Capillary: 222 mg/dL — ABNORMAL HIGH (ref 70–99)

## 2020-07-13 LAB — I-STAT BETA HCG BLOOD, ED (MC, WL, AP ONLY): I-stat hCG, quantitative: <5 m[IU]/mL (ref <5)

## 2020-07-13 LAB — CERVICOVAGINAL ANCILLARY ONLY
Bacterial Vaginitis (gardnerella): NEGATIVE
Chlamydia: NEGATIVE
Comment: NEGATIVE
Comment: NEGATIVE
Comment: NEGATIVE
Comment: NORMAL
Neisseria Gonorrhea: NEGATIVE
Trichomonas: NEGATIVE

## 2020-07-13 MED ORDER — AMLODIPINE BESYLATE 5 MG PO TABS
5.0000 mg | ORAL_TABLET | Freq: Every day | ORAL | Status: DC
  Administered 2020-07-13: 5 mg via ORAL
  Filled 2020-07-13: qty 1

## 2020-07-13 MED ORDER — METFORMIN HCL 500 MG PO TABS
1000.0000 mg | ORAL_TABLET | Freq: Every day | ORAL | Status: DC
  Administered 2020-07-13: 1000 mg via ORAL
  Filled 2020-07-13: qty 2

## 2020-07-13 NOTE — ED Provider Notes (Signed)
[facility] EMERGENCY DEPARTMENT Provider Note   CSN: [id number] Arrival date & time: 07/13/20  1756     History Chief Complaint  Patient presents with  . Hypertension  . Hyperglycemia    Misty [name] is a 29 y.o. female.  Patient with PMH of DM and HTN presents to the ED with a chief complaint of high BP and hyperglycemia.  She states that she has been out of her medications for the past several days.  She states that she has felt fatigued.  She was seen at urgent care yesterday and was prescribed refills, but she hasn't picked them up yet. There are no other associated symptoms.   The history is provided by the patient. No language interpreter was used.       Past Medical History:  Diagnosis Date  . Allergy   . Anemia 2015  . Asthma    as child  . Diabetes mellitus without complication ([place]) 4235  . Eczema   . Hypertension 09/2013    There are no problems to display for this patient.   Past Surgical History:  Procedure Laterality Date  . Abcess on back       OB History    Gravida  1   Para  0   Term  0   Preterm  0   AB  1   Living  0     SAB  1   TAB  0   Ectopic  0   Multiple  0   Live Births              Family History  Problem Relation Age of Onset  . Hypertension Mother   . Heart disease Mother 37       CHF  . Diabetes Mother   . Sickle cell anemia Brother   . Hypertension Maternal Grandfather   . Diabetes Maternal Grandfather   . Heart disease Maternal Grandfather   . Healthy Father   . Diabetes Maternal Grandmother   . Heart disease Maternal Grandmother     Social History   Tobacco Use  . Smoking status: Former Smoker    Packs/day: 0.50    Years: 2.00    Pack years: 1.00    Quit date: 09/08/2013    Years since quitting: 6.8  . Smokeless tobacco: Never Used  Vaping Use  . Vaping Use: Never used  Substance Use Topics  . Alcohol use: No    Alcohol/week: 0.0 standard drinks  . Drug use: No     Home Medications Prior to Admission medications   Medication Sig Start Date End Date Taking? Authorizing Provider  amLODipine (NORVASC) 5 MG tablet Take 1 tablet (5 mg total) by mouth daily. 07/12/20   Raylene Everts, MD  blood glucose meter kit and supplies KIT Dispense based on patient and insurance preference. Use up to four times daily as directed. (FOR ICD-9 250.00, 250.01). 01/10/16   Hedges, Dellis Filbert, PA-C  metFORMIN (GLUCOPHAGE) 500 MG tablet Take 2 tablets (1,000 mg total) by mouth daily with breakfast. 07/12/20   Raylene Everts, MD  norelgestromin-ethinyl estradiol Marilu Favre) 150-35 MCG/24HR transdermal patch Apply one patch onto the skin once weekly as directed. Patient not taking: Reported on 02/25/2020 05/14/19   Fontaine, Belinda Block, MD  cetirizine (ZYRTEC) 10 MG tablet Take 1 tablet (10 mg total) by mouth daily. Patient not taking: Reported on 02/25/2020 01/16/20 07/02/20  Loura Halt A, NP  dicyclomine (BENTYL) 20 MG tablet Take  1 tablet (20 mg total) by mouth 2 (two) times daily. 08/15/18 01/16/20  Bethel Born, PA-C  fluticasone (FLONASE) 50 MCG/ACT nasal spray Place 2 sprays into both nostrils daily. 01/14/19 01/16/20  Gwyneth Sprout, MD  ipratropium (ATROVENT) 0.06 % nasal spray Place 2 sprays into both nostrils 4 (four) times daily. 01/14/19 01/16/20  Gwyneth Sprout, MD  omeprazole (PRILOSEC) 20 MG capsule Take 1 capsule (20 mg total) by mouth daily. 01/14/19 01/16/20  Gwyneth Sprout, MD    Allergies    Iodides, Morphine and related, Shellfish allergy, and Ultram [tramadol hcl]  Review of Systems   Review of Systems  All other systems reviewed and are negative.   Physical Exam Updated Vital Signs BP 130/78   Pulse 78   Temp 98.4 F (36.9 C)   Resp 15   Ht 5\' 3"  (1.6 m)   Wt 83.5 kg   LMP 07/10/2020   SpO2 98%   BMI 32.59 kg/m   Physical Exam Vitals and nursing note reviewed.  Constitutional:      General: She is not in acute distress.     Appearance: She is well-developed.  HENT:     Head: Normocephalic and atraumatic.  Eyes:     Conjunctiva/sclera: Conjunctivae normal.  Cardiovascular:     Rate and Rhythm: Normal rate and regular rhythm.     Heart sounds: No murmur heard.   Pulmonary:     Effort: Pulmonary effort is normal. No respiratory distress.     Breath sounds: Normal breath sounds.  Abdominal:     Palpations: Abdomen is soft.     Tenderness: There is no abdominal tenderness.  Musculoskeletal:     Cervical back: Neck supple.  Skin:    General: Skin is warm and dry.  Neurological:     Mental Status: She is alert and oriented to person, place, and time.  Psychiatric:        Mood and Affect: Mood normal.        Behavior: Behavior normal.     ED Results / Procedures / Treatments   Labs (all labs ordered are listed, but only abnormal results are displayed) Labs Reviewed  COMPREHENSIVE METABOLIC PANEL - Abnormal; Notable for the following components:      Result Value   Glucose, Bld 267 (*)    All other components within normal limits  CBC  I-STAT BETA HCG BLOOD, ED ([facility], [facility], [facility] ONLY)  CBG MONITORING, ED    EKG None  Radiology No results found.  Procedures Procedures (including critical care time)  Medications Ordered in ED Medications  amLODipine (NORVASC) tablet 5 mg (has no administration in time range)  metFORMIN (GLUCOPHAGE) tablet 1,000 mg (has no administration in time range)    ED Course  I have reviewed the triage vital signs and the nursing notes.  Pertinent labs & imaging results that were available during my care of the patient were reviewed by me and considered in my medical decision making (see chart for details).    MDM Rules/Calculators/A&P                          Patient here with complaints of hyperglycemia and elevated BP.  She has not had her meds for the past few days.  She was seen at urgent care yesterday and had her meds refilled, but she hasn't picked them up  yet.  She asks for a dose now.  Her BP is 130/78 and glucose  is 267 on CMP.  She does not have any severe electrolyte derangements or evidence of acidosis.  Patient appears stable for discharge and outpatient follow-up.  Advised patient to get her meds filled and follow-up with her doctor if not improving in the next 1-2 weeks.   Return for new or worsening symptoms. Final Clinical Impression(s) / ED Diagnoses Final diagnoses:  Elevated blood pressure reading  Hyperglycemia    Rx / DC Orders ED Discharge Orders    None       Montine Circle, PA-C 07/13/20 2242    Isla Pence, MD 07/13/20 2303

## 2020-07-13 NOTE — ED Triage Notes (Signed)
The pt arrived by [facility]  She reports that her blood sugar and her bp is high..  The pt was at [facility] yesterday  For the same reasons shes here today  dhr received bp med rx yesterday  She had been out of meds for 2 days until yesterday  She reports that neither is well controlled  lmp now

## 2020-08-27 ENCOUNTER — Emergency Department (HOSPITAL_COMMUNITY)
Admission: EM | Admit: 2020-08-27 | Discharge: 2020-08-27 | Disposition: A | Discharge status: Home / Self Care | Payer: 59 | Attending: Emergency Medicine | Admitting: Emergency Medicine

## 2020-08-27 DIAGNOSIS — Z7984 Long term (current) use of oral hypoglycemic drugs: Secondary | ICD-10-CM | POA: Insufficient documentation

## 2020-08-27 DIAGNOSIS — R739 Hyperglycemia, unspecified: Secondary | ICD-10-CM

## 2020-08-27 DIAGNOSIS — E1165 Type 2 diabetes mellitus with hyperglycemia: Secondary | ICD-10-CM | POA: Diagnosis not present

## 2020-08-27 DIAGNOSIS — Z7951 Long term (current) use of inhaled steroids: Secondary | ICD-10-CM | POA: Diagnosis not present

## 2020-08-27 DIAGNOSIS — Z87891 Personal history of nicotine dependence: Secondary | ICD-10-CM | POA: Diagnosis not present

## 2020-08-27 DIAGNOSIS — J45909 Unspecified asthma, uncomplicated: Secondary | ICD-10-CM | POA: Insufficient documentation

## 2020-08-27 DIAGNOSIS — Z79899 Other long term (current) drug therapy: Secondary | ICD-10-CM | POA: Diagnosis not present

## 2020-08-27 DIAGNOSIS — I10 Essential (primary) hypertension: Secondary | ICD-10-CM | POA: Insufficient documentation

## 2020-08-27 LAB — COMPREHENSIVE METABOLIC PANEL
ALT: 31 U/L (ref 0–44)
AST: 19 U/L (ref 15–41)
Albumin: 3.6 g/dL (ref 3.5–5.0)
Alkaline Phosphatase: 121 U/L (ref 38–126)
Anion gap: 12 (ref 5–15)
BUN: 5 mg/dL — ABNORMAL LOW (ref 6–20)
CO2: 22 mmol/L (ref 22–32)
Calcium: 9 mg/dL (ref 8.9–10.3)
Chloride: 98 mmol/L (ref 98–111)
Creatinine, Ser: 0.58 mg/dL (ref 0.44–1.00)
GFR, Estimated: >60 mL/min (ref >60)
Glucose, Bld: 353 mg/dL — ABNORMAL HIGH (ref 70–99)
Potassium: 3.5 mmol/L (ref 3.5–5.1)
Sodium: 132 mmol/L — ABNORMAL LOW (ref 135–145)
Total Bilirubin: 0.4 mg/dL (ref 0.3–1.2)
Total Protein: 7 g/dL (ref 6.5–8.1)

## 2020-08-27 LAB — CBC WITH DIFFERENTIAL/PLATELET
Abs Immature Granulocytes: 0.04 10*3/uL (ref 0.00–0.07)
Basophils Absolute: 0 10*3/uL (ref 0.0–0.1)
Basophils Relative: 0 %
Eosinophils Absolute: 0.3 10*3/uL (ref 0.0–0.5)
Eosinophils Relative: 3 %
HCT: 37.7 % (ref 36.0–46.0)
Hemoglobin: 13 g/dL (ref 12.0–15.0)
Immature Granulocytes: 1 %
Lymphocytes Relative: 10 %
Lymphs Abs: 0.9 10*3/uL (ref 0.7–4.0)
MCH: 31.8 pg (ref 26.0–34.0)
MCHC: 34.5 g/dL (ref 30.0–36.0)
MCV: 92.2 fL (ref 80.0–100.0)
Monocytes Absolute: 0.4 10*3/uL (ref 0.1–1.0)
Monocytes Relative: 5 %
Neutro Abs: 6.8 10*3/uL (ref 1.7–7.7)
Neutrophils Relative %: 81 %
Platelets: 301 10*3/uL (ref 150–400)
RBC: 4.09 MIL/uL (ref 3.87–5.11)
RDW: 12.1 % (ref 11.5–15.5)
WBC: 8.5 10*3/uL (ref 4.0–10.5)
nRBC: 0 % (ref 0.0–0.2)

## 2020-08-27 LAB — URINALYSIS, ROUTINE W REFLEX MICROSCOPIC
Bacteria, UA: NONE SEEN
Bilirubin Urine: NEGATIVE
Glucose, UA: >=500 mg/dL — AB
Hgb urine dipstick: NEGATIVE
Ketones, ur: 20 mg/dL — AB
Leukocytes,Ua: NEGATIVE
Nitrite: NEGATIVE
Protein, ur: NEGATIVE mg/dL
Specific Gravity, Urine: 1.037 — ABNORMAL HIGH (ref 1.005–1.030)
pH: 6 (ref 5.0–8.0)

## 2020-08-27 LAB — CBG MONITORING, ED
Glucose-Capillary: 345 mg/dL — ABNORMAL HIGH (ref 70–99)
Glucose-Capillary: 338 mg/dL — ABNORMAL HIGH (ref 70–99)
Glucose-Capillary: 304 mg/dL — ABNORMAL HIGH (ref 70–99)

## 2020-08-27 MED ORDER — LACTATED RINGERS IV BOLUS
1000.0000 mL | Freq: Once | INTRAVENOUS | Status: AC
  Administered 2020-08-27: 04:00:00 1000 mL via INTRAVENOUS
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
## 2020-08-27 NOTE — ED Notes (Signed)
Patient verbalized understanding of discharge instructions. Opportunity for questions and answers.  
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
## 2020-08-27 NOTE — Discharge Instructions (Signed)
Please make sure that you follow-up with your regular doctor next week.  You need to discuss your blood pressure as well as your high blood sugar.  Continue taking your Metformin for your diabetes as well as your Norvasc for your blood pressure.  If your symptoms continue to worsen, you can always return to the emergency department for reevaluation.  It was a pleasure to meet you.

## 2020-09-01 ENCOUNTER — Emergency Department (HOSPITAL_COMMUNITY)
Admission: EM | Admit: 2020-09-01 | Discharge: 2020-09-01 | Disposition: A | Discharge status: Home / Self Care | Payer: 59 | Source: Home / Self Care

## 2020-09-01 DIAGNOSIS — Z5321 Procedure and treatment not carried out due to patient leaving prior to being seen by health care provider: Secondary | ICD-10-CM | POA: Insufficient documentation

## 2020-09-01 DIAGNOSIS — L02212 Cutaneous abscess of back [any part, except buttock]: Secondary | ICD-10-CM | POA: Insufficient documentation

## 2020-09-01 DIAGNOSIS — L0291 Cutaneous abscess, unspecified: Secondary | ICD-10-CM | POA: Diagnosis not present

## 2020-09-01 DIAGNOSIS — L02811 Cutaneous abscess of head [any part, except face]: Secondary | ICD-10-CM | POA: Insufficient documentation

## 2020-09-01 DIAGNOSIS — A419 Sepsis, unspecified organism: Secondary | ICD-10-CM | POA: Diagnosis not present

## 2020-09-01 LAB — CBC WITH DIFFERENTIAL/PLATELET
Abs Immature Granulocytes: 0.17 10*3/uL — ABNORMAL HIGH (ref 0.00–0.07)
Basophils Absolute: 0.1 10*3/uL (ref 0.0–0.1)
Basophils Relative: 0 %
Eosinophils Absolute: 0.2 10*3/uL (ref 0.0–0.5)
Eosinophils Relative: 1 %
HCT: 37.4 % (ref 36.0–46.0)
Hemoglobin: 13.1 g/dL (ref 12.0–15.0)
Immature Granulocytes: 1 %
Lymphocytes Relative: 4 %
Lymphs Abs: 0.8 10*3/uL (ref 0.7–4.0)
MCH: 31.6 pg (ref 26.0–34.0)
MCHC: 35 g/dL (ref 30.0–36.0)
MCV: 90.3 fL (ref 80.0–100.0)
Monocytes Absolute: 0.5 10*3/uL (ref 0.1–1.0)
Monocytes Relative: 3 %
Neutro Abs: 18.6 10*3/uL — ABNORMAL HIGH (ref 1.7–7.7)
Neutrophils Relative %: 91 %
Platelets: 417 10*3/uL — ABNORMAL HIGH (ref 150–400)
RBC: 4.14 MIL/uL (ref 3.87–5.11)
RDW: 12.1 % (ref 11.5–15.5)
WBC: 20.3 10*3/uL — ABNORMAL HIGH (ref 4.0–10.5)
nRBC: 0 % (ref 0.0–0.2)

## 2020-09-01 LAB — COMPREHENSIVE METABOLIC PANEL
ALT: 16 U/L (ref 0–44)
AST: 14 U/L — ABNORMAL LOW (ref 15–41)
Albumin: 3.3 g/dL — ABNORMAL LOW (ref 3.5–5.0)
Alkaline Phosphatase: 134 U/L — ABNORMAL HIGH (ref 38–126)
Anion gap: 16 — ABNORMAL HIGH (ref 5–15)
BUN: 7 mg/dL (ref 6–20)
CO2: 21 mmol/L — ABNORMAL LOW (ref 22–32)
Calcium: 9.2 mg/dL (ref 8.9–10.3)
Chloride: 94 mmol/L — ABNORMAL LOW (ref 98–111)
Creatinine, Ser: 0.68 mg/dL (ref 0.44–1.00)
GFR, Estimated: >60 mL/min (ref >60)
Glucose, Bld: 391 mg/dL — ABNORMAL HIGH (ref 70–99)
Potassium: 3.9 mmol/L (ref 3.5–5.1)
Sodium: 131 mmol/L — ABNORMAL LOW (ref 135–145)
Total Bilirubin: 0.4 mg/dL (ref 0.3–1.2)
Total Protein: 7.2 g/dL (ref 6.5–8.1)

## 2020-09-01 LAB — I-STAT BETA HCG BLOOD, ED (MC, WL, AP ONLY): I-stat hCG, quantitative: <5 m[IU]/mL (ref <5)

## 2020-09-01 LAB — LACTIC ACID, PLASMA: Lactic Acid, Venous: 1.5 mmol/L (ref 0.5–1.9)

## 2020-09-01 MED ORDER — OXYCODONE-ACETAMINOPHEN 5-325 MG PO TABS
1.0000 | ORAL_TABLET | ORAL | Status: DC | PRN
  Administered 2020-09-01: 1 via ORAL
  Filled 2020-09-01: qty 1

## 2020-09-01 NOTE — ED Notes (Signed)
Pt did not answer for vitals x3 
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
## 2020-09-01 NOTE — ED Notes (Signed)
Pt called x 3 with no answer

## 2020-09-02 ENCOUNTER — Other Ambulatory Visit: Payer: Self-pay

## 2020-09-02 ENCOUNTER — Inpatient Hospital Stay (HOSPITAL_COMMUNITY)
Admission: EM | Admit: 2020-09-02 | Discharge: 2020-09-06 | DRG: 871 | Disposition: A | Discharge status: Home / Self Care | Payer: 59 | Attending: Internal Medicine | Admitting: Internal Medicine

## 2020-09-02 ENCOUNTER — Encounter (HOSPITAL_COMMUNITY): Payer: Self-pay | Admitting: Emergency Medicine

## 2020-09-02 DIAGNOSIS — D75838 Other thrombocytosis: Secondary | ICD-10-CM | POA: Diagnosis present

## 2020-09-02 DIAGNOSIS — I10 Essential (primary) hypertension: Secondary | ICD-10-CM | POA: Diagnosis present

## 2020-09-02 DIAGNOSIS — O10919 Unspecified pre-existing hypertension complicating pregnancy, unspecified trimester: Secondary | ICD-10-CM | POA: Diagnosis present

## 2020-09-02 DIAGNOSIS — Z7984 Long term (current) use of oral hypoglycemic drugs: Secondary | ICD-10-CM

## 2020-09-02 DIAGNOSIS — L02212 Cutaneous abscess of back [any part, except buttock]: Secondary | ICD-10-CM | POA: Diagnosis present

## 2020-09-02 DIAGNOSIS — L03312 Cellulitis of back [any part except buttock]: Secondary | ICD-10-CM | POA: Diagnosis present

## 2020-09-02 DIAGNOSIS — Z91013 Allergy to seafood: Secondary | ICD-10-CM | POA: Diagnosis not present

## 2020-09-02 DIAGNOSIS — E861 Hypovolemia: Secondary | ICD-10-CM | POA: Diagnosis present

## 2020-09-02 DIAGNOSIS — E1110 Type 2 diabetes mellitus with ketoacidosis without coma: Secondary | ICD-10-CM | POA: Diagnosis present

## 2020-09-02 DIAGNOSIS — Z833 Family history of diabetes mellitus: Secondary | ICD-10-CM

## 2020-09-02 DIAGNOSIS — L0291 Cutaneous abscess, unspecified: Secondary | ICD-10-CM | POA: Diagnosis present

## 2020-09-02 DIAGNOSIS — Z79899 Other long term (current) drug therapy: Secondary | ICD-10-CM

## 2020-09-02 DIAGNOSIS — E871 Hypo-osmolality and hyponatremia: Secondary | ICD-10-CM | POA: Diagnosis present

## 2020-09-02 DIAGNOSIS — L02811 Cutaneous abscess of head [any part, except face]: Secondary | ICD-10-CM | POA: Diagnosis present

## 2020-09-02 DIAGNOSIS — Z87891 Personal history of nicotine dependence: Secondary | ICD-10-CM

## 2020-09-02 DIAGNOSIS — L03811 Cellulitis of head [any part, except face]: Secondary | ICD-10-CM | POA: Diagnosis present

## 2020-09-02 DIAGNOSIS — Z20822 Contact with and (suspected) exposure to covid-19: Secondary | ICD-10-CM | POA: Diagnosis present

## 2020-09-02 DIAGNOSIS — A419 Sepsis, unspecified organism: Principal | ICD-10-CM | POA: Diagnosis present

## 2020-09-02 HISTORY — DX: Cutaneous abscess, unspecified: L02.91

## 2020-09-02 HISTORY — DX: Type 2 diabetes mellitus with ketoacidosis without coma: E11.10

## 2020-09-02 LAB — CBG MONITORING, ED
Glucose-Capillary: 483 mg/dL — ABNORMAL HIGH (ref 70–99)
Glucose-Capillary: 315 mg/dL — ABNORMAL HIGH (ref 70–99)

## 2020-09-02 LAB — CBC WITH DIFFERENTIAL/PLATELET
Abs Immature Granulocytes: 0.15 10*3/uL — ABNORMAL HIGH (ref 0.00–0.07)
Basophils Absolute: 0.1 10*3/uL (ref 0.0–0.1)
Basophils Relative: 0 %
Eosinophils Absolute: 0.1 10*3/uL (ref 0.0–0.5)
Eosinophils Relative: 0 %
HCT: 38.7 % (ref 36.0–46.0)
Hemoglobin: 12.6 g/dL (ref 12.0–15.0)
Immature Granulocytes: 1 %
Lymphocytes Relative: 3 %
Lymphs Abs: 0.6 10*3/uL — ABNORMAL LOW (ref 0.7–4.0)
MCH: 30.6 pg (ref 26.0–34.0)
MCHC: 32.6 g/dL (ref 30.0–36.0)
MCV: 93.9 fL (ref 80.0–100.0)
Monocytes Absolute: 0.6 10*3/uL (ref 0.1–1.0)
Monocytes Relative: 3 %
Neutro Abs: 18 10*3/uL — ABNORMAL HIGH (ref 1.7–7.7)
Neutrophils Relative %: 93 %
Platelets: 408 10*3/uL — ABNORMAL HIGH (ref 150–400)
RBC: 4.12 MIL/uL (ref 3.87–5.11)
RDW: 12 % (ref 11.5–15.5)
WBC: 19.6 10*3/uL — ABNORMAL HIGH (ref 4.0–10.5)
nRBC: 0 % (ref 0.0–0.2)

## 2020-09-02 LAB — RESP PANEL BY RT-PCR (FLU A&B, COVID) ARPGX2
Influenza A by PCR: NEGATIVE
Influenza B by PCR: NEGATIVE
SARS Coronavirus 2 by RT PCR: NEGATIVE

## 2020-09-02 LAB — URINALYSIS, ROUTINE W REFLEX MICROSCOPIC
Bacteria, UA: NONE SEEN
Bilirubin Urine: NEGATIVE
Glucose, UA: >=500 mg/dL — AB
Ketones, ur: 80 mg/dL — AB
Leukocytes,Ua: NEGATIVE
Nitrite: NEGATIVE
Protein, ur: NEGATIVE mg/dL
Specific Gravity, Urine: 1.032 — ABNORMAL HIGH (ref 1.005–1.030)
pH: 5 (ref 5.0–8.0)

## 2020-09-02 LAB — BASIC METABOLIC PANEL
Anion gap: 16 — ABNORMAL HIGH (ref 5–15)
Anion gap: 13 (ref 5–15)
BUN: 6 mg/dL (ref 6–20)
BUN: <5 mg/dL — ABNORMAL LOW (ref 6–20)
CO2: 17 mmol/L — ABNORMAL LOW (ref 22–32)
CO2: 21 mmol/L — ABNORMAL LOW (ref 22–32)
Calcium: 8.9 mg/dL (ref 8.9–10.3)
Calcium: 9 mg/dL (ref 8.9–10.3)
Chloride: 94 mmol/L — ABNORMAL LOW (ref 98–111)
Chloride: 98 mmol/L (ref 98–111)
Creatinine, Ser: 0.79 mg/dL (ref 0.44–1.00)
Creatinine, Ser: 0.56 mg/dL (ref 0.44–1.00)
GFR, Estimated: >60 mL/min (ref >60)
GFR, Estimated: >60 mL/min (ref >60)
Glucose, Bld: 530 mg/dL (ref 70–99)
Glucose, Bld: 156 mg/dL — ABNORMAL HIGH (ref 70–99)
Potassium: 3.9 mmol/L (ref 3.5–5.1)
Potassium: 3.3 mmol/L — ABNORMAL LOW (ref 3.5–5.1)
Sodium: 127 mmol/L — ABNORMAL LOW (ref 135–145)
Sodium: 132 mmol/L — ABNORMAL LOW (ref 135–145)

## 2020-09-02 LAB — PREGNANCY, URINE: Preg Test, Ur: NEGATIVE

## 2020-09-02 LAB — I-STAT VENOUS BLOOD GAS, ED
Acid-base deficit: 2 mmol/L (ref 0.0–2.0)
Bicarbonate: 22.2 mmol/L (ref 20.0–28.0)
Calcium, Ion: 1.12 mmol/L — ABNORMAL LOW (ref 1.15–1.40)
HCT: 41 % (ref 36.0–46.0)
Hemoglobin: 13.9 g/dL (ref 12.0–15.0)
O2 Saturation: 63 %
Potassium: 3.8 mmol/L (ref 3.5–5.1)
Sodium: 130 mmol/L — ABNORMAL LOW (ref 135–145)
TCO2: 23 mmol/L (ref 22–32)
pCO2, Ven: 36.4 mmHg — ABNORMAL LOW (ref 44.0–60.0)
pH, Ven: 7.394 (ref 7.250–7.430)
pO2, Ven: 33 mmHg (ref 32.0–45.0)

## 2020-09-02 LAB — HEMOGLOBIN A1C
Hgb A1c MFr Bld: 11.4 % — ABNORMAL HIGH (ref 4.8–5.6)
Mean Plasma Glucose: 280.48 mg/dL

## 2020-09-02 LAB — I-STAT BETA HCG BLOOD, ED (MC, WL, AP ONLY): I-stat hCG, quantitative: 8 m[IU]/mL — ABNORMAL HIGH (ref <5)

## 2020-09-02 LAB — BETA-HYDROXYBUTYRIC ACID
Beta-Hydroxybutyric Acid: 2.27 mmol/L — ABNORMAL HIGH (ref 0.05–0.27)
Beta-Hydroxybutyric Acid: 0.6 mmol/L — ABNORMAL HIGH (ref 0.05–0.27)

## 2020-09-02 LAB — GLUCOSE, CAPILLARY
Glucose-Capillary: 185 mg/dL — ABNORMAL HIGH (ref 70–99)
Glucose-Capillary: 144 mg/dL — ABNORMAL HIGH (ref 70–99)

## 2020-09-02 MED ORDER — LIDOCAINE-EPINEPHRINE 1 %-1:100000 IJ SOLN
10.0000 mL | Freq: Once | INTRAMUSCULAR | Status: AC
  Administered 2020-09-02: 10 mL
  Filled 2020-09-02: qty 1

## 2020-09-02 MED ORDER — DEXTROSE 50 % IV SOLN: 0.0000 mL | INTRAVENOUS | Status: DC | PRN

## 2020-09-02 MED ORDER — AMLODIPINE BESYLATE 5 MG PO TABS
5.0000 mg | ORAL_TABLET | Freq: Every day | ORAL | Status: DC
  Administered 2020-09-03 – 2020-09-06 (×4): 5 mg via ORAL
  Filled 2020-09-02 (×4): qty 1

## 2020-09-02 MED ORDER — ONDANSETRON HCL 4 MG/2ML IJ SOLN: 4.0000 mg | Freq: Four times a day (QID) | INTRAMUSCULAR | Status: DC | PRN

## 2020-09-02 MED ORDER — LACTATED RINGERS IV BOLUS
1000.0000 mL | Freq: Once | INTRAVENOUS | Status: AC
  Administered 2020-09-02: 1000 mL via INTRAVENOUS

## 2020-09-02 MED ORDER — LACTATED RINGERS IV SOLN: INTRAVENOUS | Status: DC

## 2020-09-02 MED ORDER — OXYCODONE-ACETAMINOPHEN 5-325 MG PO TABS
1.0000 | ORAL_TABLET | Freq: Once | ORAL | Status: AC
  Administered 2020-09-02: 1 via ORAL
  Filled 2020-09-02: qty 1

## 2020-09-02 MED ORDER — ENOXAPARIN SODIUM 40 MG/0.4ML ~~LOC~~ SOLN
40.0000 mg | SUBCUTANEOUS | Status: DC
  Administered 2020-09-02 – 2020-09-05 (×4): 40 mg via SUBCUTANEOUS
  Filled 2020-09-02 (×5): qty 0.4

## 2020-09-02 MED ORDER — SODIUM CHLORIDE 0.9 % IV SOLN
1.0000 g | Freq: Once | INTRAVENOUS | Status: AC
  Administered 2020-09-02: 1 g via INTRAVENOUS
  Filled 2020-09-02: qty 10

## 2020-09-02 MED ORDER — FENTANYL CITRATE (PF) 100 MCG/2ML IJ SOLN
50.0000 ug | INTRAMUSCULAR | Status: AC | PRN
  Administered 2020-09-03 (×3): 50 ug via INTRAVENOUS
  Filled 2020-09-02 (×3): qty 2

## 2020-09-02 MED ORDER — DEXTROSE IN LACTATED RINGERS 5 % IV SOLN: INTRAVENOUS | Status: DC

## 2020-09-02 MED ORDER — INSULIN REGULAR(HUMAN) IN NACL 100-0.9 UT/100ML-% IV SOLN
INTRAVENOUS | Status: DC
  Administered 2020-09-02: 17 [IU]/h via INTRAVENOUS
  Filled 2020-09-02: qty 100

## 2020-09-02 MED ORDER — LIDOCAINE-EPINEPHRINE-TETRACAINE (LET) TOPICAL GEL
3.0000 mL | Freq: Once | TOPICAL | Status: AC
  Administered 2020-09-02: 3 mL via TOPICAL
  Filled 2020-09-02: qty 3

## 2020-09-02 MED ORDER — ACETAMINOPHEN 325 MG PO TABS
650.0000 mg | ORAL_TABLET | Freq: Once | ORAL | Status: AC
  Administered 2020-09-02: 650 mg via ORAL
  Filled 2020-09-02: qty 2

## 2020-09-02 MED ORDER — POTASSIUM CHLORIDE 10 MEQ/100ML IV SOLN
10.0000 meq | INTRAVENOUS | Status: AC
  Administered 2020-09-03 (×2): 10 meq via INTRAVENOUS
  Filled 2020-09-02 (×2): qty 100

## 2020-09-02 MED ORDER — POTASSIUM CHLORIDE 10 MEQ/100ML IV SOLN
10.0000 meq | INTRAVENOUS | Status: AC
  Administered 2020-09-02 (×2): 10 meq via INTRAVENOUS
  Filled 2020-09-02 (×2): qty 100

## 2020-09-02 MED ORDER — LACTATED RINGERS IV BOLUS
500.0000 mL | Freq: Once | INTRAVENOUS | Status: AC
  Administered 2020-09-02: 500 mL via INTRAVENOUS

## 2020-09-02 MED ORDER — ACETAMINOPHEN 325 MG PO TABS
650.0000 mg | ORAL_TABLET | Freq: Four times a day (QID) | ORAL | Status: DC | PRN
  Administered 2020-09-03: 650 mg via ORAL
  Filled 2020-09-02: qty 2

## 2020-09-02 NOTE — ED Triage Notes (Signed)
Patient coming from home. Complaint of abscess on back. Patient states she came yesterday but left before being seen.

## 2020-09-02 NOTE — Progress Notes (Signed)
Patient received from ED into room 4E09. Skin assessment and CHG completed. Abscesses of back and head noted. Vital signs taken and charted. Patient on Endotool, BG taken and settings changed appropriately. Cardiac monitor applied and central telemetry notified. Will continue to monitor.  -Estella Husk, RN

## 2020-09-02 NOTE — H&P (Signed)
History and Physical    Misty [name] QPR:[id number] DOB: 09-20-1990 DOA: 09/02/2020  PCP: Trey Sailors, PA   Patient coming from: Home   Chief Complaint: Abscesses involving scalp and low back, hyperglycemia   HPI: Misty [name] is a 29 y.o. female with medical history significant for type 2 diabetes mellitus, hypertension, and eczema, presenting to the emergency department for evaluation of elevated blood sugar and abscesses involving her scalp and low back.  Patient reports a history of recurrent skin infections and developed abscesses involving her right parietal scalp and low back couple weeks ago.  These have become more tender and enlarged over the past few days.  She denies any associated fevers.  She also notes that her blood glucose has been elevated over the past week, often in the 300s.  She takes Metformin only at home but checks her sugars.  She came to the emergency department yesterday for evaluation of elevated glucose, had a CBG of 571, but left prior to completing her evaluation.  Glucose has continued to be elevated at home.  She denies any chest pain, abdominal pain, nausea, vomiting, cough, or shortness of breath.  ED Course: Upon arrival to the ED, patient is found to be afebrile, saturating well on room air, tachycardic to 130, and with stable blood pressure.  Chemistry panel is notable for glucose of 530 with low serum bicarbonate and elevated anion gap.  Beta hydroxybutyrate is elevated to 2.27.  CBC is notable for a leukocytosis to 19,600 and a mild thrombocytosis.  Skin abscesses were incised and drained in the emergency department and the patient was given 1.5 L of LR, 1 g Rocephin, and started on insulin infusion.  COVID-19 PCR is negative.  Review of Systems:  All other systems reviewed and apart from HPI, are negative.  Past Medical History:  Diagnosis Date  . Allergy   . Anemia 2015  . Asthma    as child  . Diabetes mellitus without complication  ([place]) 9935  . Eczema   . Hypertension 09/2013    Past Surgical History:  Procedure Laterality Date  . Abcess on back      Social History:   reports that she quit smoking about 6 years ago. She has a 1.00 pack-year smoking history. She has never used smokeless tobacco. She reports that she does not drink alcohol and does not use drugs.  Allergies  Allergen Reactions  . Iodides Anaphylaxis and Itching  . Morphine And Related Anaphylaxis and Itching  . Shellfish Allergy Anaphylaxis and Itching    Pt reports "watery eyes"  . Ultram [Tramadol Hcl] Hives    Hives and swollen lips     Family History  Problem Relation Age of Onset  . Hypertension Mother   . Heart disease Mother 26       CHF  . Diabetes Mother   . Sickle cell anemia Brother   . Hypertension Maternal Grandfather   . Diabetes Maternal Grandfather   . Heart disease Maternal Grandfather   . Healthy Father   . Diabetes Maternal Grandmother   . Heart disease Maternal Grandmother      Prior to Admission medications   Medication Sig Start Date End Date Taking? Authorizing Provider  amLODipine (NORVASC) 5 MG tablet Take 1 tablet (5 mg total) by mouth daily. 07/12/20  Yes Raylene Everts, MD  metFORMIN (GLUCOPHAGE) 500 MG tablet Take 2 tablets (1,000 mg total) by mouth daily with breakfast. 07/12/20  Yes Raylene Everts,  MD  blood glucose meter kit and supplies KIT Dispense based on patient and insurance preference. Use up to four times daily as directed. (FOR ICD-9 250.00, 250.01). 01/10/16   Hedges, Dellis Filbert, PA-C  cetirizine (ZYRTEC) 10 MG tablet Take 1 tablet (10 mg total) by mouth daily. Patient not taking: Reported on 02/25/2020 01/16/20 07/02/20  Loura Halt A, NP  dicyclomine (BENTYL) 20 MG tablet Take 1 tablet (20 mg total) by mouth 2 (two) times daily. 08/15/18 01/16/20  Recardo Evangelist, PA-C  fluticasone (FLONASE) 50 MCG/ACT nasal spray Place 2 sprays into both nostrils daily. 01/14/19 01/16/20  Blanchie Dessert, MD  ipratropium (ATROVENT) 0.06 % nasal spray Place 2 sprays into both nostrils 4 (four) times daily. 01/14/19 01/16/20  Blanchie Dessert, MD  omeprazole (PRILOSEC) 20 MG capsule Take 1 capsule (20 mg total) by mouth daily. 01/14/19 01/16/20  Blanchie Dessert, MD    Physical Exam: Vitals:   09/02/20 1900 09/02/20 1915 09/02/20 1930 09/02/20 1945  BP: 118/60 130/83 (!) 156/130 (!) 141/89  Pulse: (!) 118 (!) 118 (!) 128 (!) 121  Resp: _0 Temp:      TempSrc:      SpO2: 98% 96% 91% 99%  Height:        Constitutional: NAD, calm  Eyes: PERTLA, lids and conjunctivae normal ENMT: Mucous membranes are dry. Posterior pharynx clear of any exudate or lesions.   Neck: normal, supple, no masses, no thyromegaly Respiratory:  no wheezing, no crackles. No accessory muscle use.  Cardiovascular: Rate ~120 and regular. No extremity edema.   Abdomen: No distension, no tenderness, soft. Bowel sounds active.  Musculoskeletal: no clubbing / cyanosis. No joint deformity upper and lower extremities.   Skin: Tender and fluctuant nodule at right parietal scalp and at lower back on the left. Warm, dry, well-perfused. Xerosis.  Neurologic: CN 2-12 grossly intact. Sensation intact. Moving all extremities.  Psychiatric: Alert and oriented to person, place, and situation. Pleasant and cooperative.    Labs and Imaging on Admission: I have personally reviewed following labs and imaging studies  CBC: Recent Labs  Lab 08/27/20 0424 09/01/20 1559 09/02/20 1301 09/02/20 1737  WBC 8.5 20.3* 19.6*  --   NEUTROABS 6.8 18.6* 18.0*  --   HGB 13.0 13.1 12.6 13.9  HCT 37.7 37.4 38.7 41.0  MCV 92.2 90.3 93.9  --   PLT 301 417* 408*  --    Basic Metabolic Panel: Recent Labs  Lab 08/27/20 0424 09/01/20 1559 09/02/20 1301 09/02/20 1737  NA 132* 131* 127* 130*  K 3.5 3.9 3.9 3.8  CL 98 94* 94*  --   CO2 22 21* 17*  --   GLUCOSE 353* 391* 530*  --   BUN 5* 7 6  --   CREATININE 0.58 0.68 0.79   --   CALCIUM 9.0 9.2 8.9  --    GFR: Estimated Creatinine Clearance: 106.1 mL/min (by C-G formula based on SCr of 0.79 mg/dL). Liver Function Tests: Recent Labs  Lab 08/27/20 0424 09/01/20 1559  AST 19 14*  ALT 31 16  ALKPHOS 121 134*  BILITOT 0.4 0.4  PROT 7.0 7.2  ALBUMIN 3.6 3.3*   No results for input(s): LIPASE, AMYLASE in the last 168 hours. No results for input(s): AMMONIA in the last 168 hours. Coagulation Profile: No results for input(s): INR, PROTIME in the last 168 hours. Cardiac Enzymes: No results for input(s): CKTOTAL, CKMB, CKMBINDEX, TROPONINI in the last 168 hours. BNP (last 3 results)  No results for input(s): PROBNP in the last 8760 hours. HbA1C: No results for input(s): HGBA1C in the last 72 hours. CBG: Recent Labs  Lab 08/27/20 0415 08/27/20 0504 08/27/20 0600 09/02/20 1248 09/02/20 1840  GLUCAP 345* 338* 304* 483* 315*   Lipid Profile: No results for input(s): CHOL, HDL, LDLCALC, TRIG, CHOLHDL, LDLDIRECT in the last 72 hours. Thyroid Function Tests: No results for input(s): TSH, T4TOTAL, FREET4, T3FREE, THYROIDAB in the last 72 hours. Anemia Panel: No results for input(s): VITAMINB12, FOLATE, FERRITIN, TIBC, IRON, RETICCTPCT in the last 72 hours. Urine analysis:    Component Value Date/Time   COLORURINE STRAW (A) 08/27/2020 0417   APPEARANCEUR HAZY (A) 08/27/2020 0417   LABSPEC 1.037 (H) 08/27/2020 0417   PHURINE 6.0 08/27/2020 0417   GLUCOSEU >=500 (A) 08/27/2020 0417   HGBUR NEGATIVE 08/27/2020 0417   BILIRUBINUR NEGATIVE 08/27/2020 0417   BILIRUBINUR neg 11/30/2013 1613   KETONESUR 20 (A) 08/27/2020 0417   PROTEINUR NEGATIVE 08/27/2020 0417   UROBILINOGEN 0.2 10/05/2017 1432   NITRITE NEGATIVE 08/27/2020 0417   LEUKOCYTESUR NEGATIVE 08/27/2020 0417   Sepsis Labs: _0 (procalcitonin:4,lacticidven:4) ) Recent Results (from the past 240 hour(s))  Resp Panel by RT-PCR (Flu A&B, Covid) Nasopharyngeal Swab     Status: None    Collection Time: 09/02/20  5:09 PM   Specimen: Nasopharyngeal Swab; Nasopharyngeal(NP) swabs in vial transport medium  Result Value Ref Range Status   SARS Coronavirus 2 by RT PCR NEGATIVE NEGATIVE Final    Comment: (NOTE) SARS-CoV-2 target nucleic acids are NOT DETECTED.  The SARS-CoV-2 RNA is generally detectable in upper respiratory specimens during the acute phase of infection. The lowest concentration of SARS-CoV-2 viral copies this assay can detect is 138 copies/mL. A negative result does not preclude SARS-Cov-2 infection and should not be used as the sole basis for treatment or other patient management decisions. A negative result may occur with  improper specimen collection/handling, submission of specimen other than nasopharyngeal swab, presence of viral mutation(s) within the areas targeted by this assay, and inadequate number of viral copies(<138 copies/mL). A negative result must be combined with clinical observations, patient history, and epidemiological information. The expected result is Negative.  Fact Sheet for Patients:  EntrepreneurPulse.com.au  Fact Sheet for Healthcare Providers:  IncredibleEmployment.be  This test is no t yet approved or cleared by the [place] FDA and  has been authorized for detection and/or diagnosis of SARS-CoV-2 by FDA under an Emergency Use Authorization (EUA). This EUA will remain  in effect (meaning this test can be used) for the duration of the COVID-19 declaration under Section 564(b)(1) of the Act, 21 U.S.C.section 360bbb-3(b)(1), unless the authorization is terminated  or revoked sooner.       Influenza A by PCR NEGATIVE NEGATIVE Final   Influenza B by PCR NEGATIVE NEGATIVE Final    Comment: (NOTE) The Xpert Xpress SARS-CoV-2/FLU/RSV plus assay is intended as an aid in the diagnosis of influenza from Nasopharyngeal swab specimens and should not be used as a sole basis for treatment.  Nasal washings and aspirates are unacceptable for Xpert Xpress SARS-CoV-2/FLU/RSV testing.  Fact Sheet for Patients: EntrepreneurPulse.com.au  Fact Sheet for Healthcare Providers: IncredibleEmployment.be  This test is not yet approved or cleared by the [place] FDA and has been authorized for detection and/or diagnosis of SARS-CoV-2 by FDA under an Emergency Use Authorization (EUA). This EUA will remain in effect (meaning this test can be used) for the duration of the COVID-19 declaration under Section 564(b)(1) of the  Act, 21 U.S.C. section 360bbb-3(b)(1), unless the authorization is terminated or revoked.  Performed at [facility], [place] [address] [place], [place] [address]      Radiological Exams on Admission: No results found.  Assessment/Plan  1. DKA, type II DM  - Type II diabetic on metformin only with no recent A1c presents with elevated CBGs at home and is found to have serum glucose of 530 with low serum bicarb, elevated AG, and BHOB of 2.27  - She was given IVF and started on insulin infusion in ED  - Update A1c, continue IVF, and continue insulin infusion with frequent CBGs and serial chem panels    2. Abscess, cellulitis  - Patient with hx of recurrent skin infections presents with abscesses involving low back and right parietal scalp with surrounding cellulitis  - There is no fever, and while she has 2 SIRS criteria, the tachycardia is likely related to DKA and hypovolemia  - She is undergoing I&D in ED and was given 1g Rocephin  - Plan to continue antibiotics with Bactrim, consider imaging if she fails to improve over the next 24-36 hours    3. Hypertension  - BP at goal, continue Norvasc     DVT prophylaxis: Lovenox  Code Status: Full  Family Communication: Discussed with patient  Disposition Plan:  Patient is from: Home  Anticipated d/c is to: Home  Anticipated d/c date is: 09/04/20 Patient currently:  pending resolution of DKA, improvement in cellulitis  Consults called: None  Admission status: Inpatient     Vianne Bulls, MD [facility]  09/02/2020, 8:32 PM

## 2020-09-02 NOTE — ED Provider Notes (Signed)
[facility] EMERGENCY DEPARTMENT Provider Note   CSN: [id number] Arrival date & time: 09/02/20  1241     History Chief Complaint  Patient presents with  . Abscess    Misty [name] is a 29 y.o. female.  HPI Patient is a 29 year old female with a history of diabetes mellitus who presents the emergency department due to multiple abscesses.  Patient notes a history of recurrent abscesses to the head and back.  She presents today with worsening abscess to the right parietal scalp as well as the left lumbar region.  Reports moderate pain to both regions.  She states the abscess on her scalp has intermittently been draining but still worsening.  Reports a history of multiple I&D's in the past.  Patient states she has been compliant with her Metformin but her blood sugar has been consistently elevated.  Reports associated fatigue.  No nausea, vomiting, diarrhea, chest pain, shortness of breath, abdominal pain.    Past Medical History:  Diagnosis Date  . Allergy   . Anemia 2015  . Asthma    as child  . Diabetes mellitus without complication ([place]) 4536  . Eczema   . Hypertension 09/2013    There are no problems to display for this patient.   Past Surgical History:  Procedure Laterality Date  . Abcess on back       OB History    Gravida  1   Para  0   Term  0   Preterm  0   AB  1   Living  0     SAB  1   IAB  0   Ectopic  0   Multiple  0   Live Births              Family History  Problem Relation Age of Onset  . Hypertension Mother   . Heart disease Mother 31       CHF  . Diabetes Mother   . Sickle cell anemia Brother   . Hypertension Maternal Grandfather   . Diabetes Maternal Grandfather   . Heart disease Maternal Grandfather   . Healthy Father   . Diabetes Maternal Grandmother   . Heart disease Maternal Grandmother     Social History   Tobacco Use  . Smoking status: Former Smoker    Packs/day: 0.50    Years: 2.00     Pack years: 1.00    Quit date: 09/08/2013    Years since quitting: 6.9  . Smokeless tobacco: Never Used  Vaping Use  . Vaping Use: Never used  Substance Use Topics  . Alcohol use: No    Alcohol/week: 0.0 standard drinks  . Drug use: No    Home Medications Prior to Admission medications   Medication Sig Start Date End Date Taking? Authorizing Provider  amLODipine (NORVASC) 5 MG tablet Take 1 tablet (5 mg total) by mouth daily. 07/12/20   Raylene Everts, MD  blood glucose meter kit and supplies KIT Dispense based on patient and insurance preference. Use up to four times daily as directed. (FOR ICD-9 250.00, 250.01). 01/10/16   Hedges, Dellis Filbert, PA-C  metFORMIN (GLUCOPHAGE) 500 MG tablet Take 2 tablets (1,000 mg total) by mouth daily with breakfast. 07/12/20   Raylene Everts, MD  cetirizine (ZYRTEC) 10 MG tablet Take 1 tablet (10 mg total) by mouth daily. Patient not taking: Reported on 02/25/2020 01/16/20 07/02/20  Loura Halt A, NP  dicyclomine (BENTYL) 20 MG tablet Take 1  tablet (20 mg total) by mouth 2 (two) times daily. 08/15/18 01/16/20  Recardo Evangelist, PA-C  fluticasone (FLONASE) 50 MCG/ACT nasal spray Place 2 sprays into both nostrils daily. 01/14/19 01/16/20  Blanchie Dessert, MD  ipratropium (ATROVENT) 0.06 % nasal spray Place 2 sprays into both nostrils 4 (four) times daily. 01/14/19 01/16/20  Blanchie Dessert, MD  omeprazole (PRILOSEC) 20 MG capsule Take 1 capsule (20 mg total) by mouth daily. 01/14/19 01/16/20  Blanchie Dessert, MD    Allergies    Iodides, Morphine and related, Shellfish allergy, and Ultram [tramadol hcl]  Review of Systems   Review of Systems  All other systems reviewed and are negative. Ten systems reviewed and are negative for acute change, except as noted in the HPI.   Physical Exam Updated Vital Signs BP 128/67 (BP Location: Left Arm)   Pulse (!) 133   Temp 99.1 F (37.3 C) (Oral)   Resp (!) 22   Ht 5' 3.5" (1.613 m)   SpO2 100%   BMI 31.39  kg/m   Physical Exam Vitals and nursing note reviewed.  Constitutional:      General: She is not in acute distress.    Appearance: Normal appearance. She is not ill-appearing, toxic-appearing or diaphoretic.  HENT:     Head: Normocephalic and atraumatic.     Comments: 4 to 5 cm region of fluctuance with overlying erythema noted to the right parietal scalp.  Consistent with abscess.    Right Ear: External ear normal.     Left Ear: External ear normal.     Nose: Nose normal.     Mouth/Throat:     Mouth: Mucous membranes are moist.     Pharynx: Oropharynx is clear. No oropharyngeal exudate or posterior oropharyngeal erythema.  Eyes:     Extraocular Movements: Extraocular movements intact.  Cardiovascular:     Rate and Rhythm: Regular rhythm. Tachycardia present.     Pulses: Normal pulses.     Heart sounds: Normal heart sounds. No murmur heard. No friction rub. No gallop.   Pulmonary:     Effort: Pulmonary effort is normal. No respiratory distress.     Breath sounds: Normal breath sounds. No stridor. No wheezing, rhonchi or rales.  Abdominal:     General: Abdomen is flat.     Palpations: Abdomen is soft.     Tenderness: There is no abdominal tenderness.  Musculoskeletal:        General: Tenderness present. Normal range of motion.     Cervical back: Normal range of motion and neck supple. No tenderness.     Comments: Tenderness as well as a 4 to 5 cm region of induration noted along the left lumbar region.  Consistent with abscess.  Small amount of overlying fluctuance.  Minimal cellulitis overlying the site.  No midline spine pain.  Skin:    General: Skin is warm and dry.  Neurological:     General: No focal deficit present.     Mental Status: She is alert and oriented to person, place, and time.  Psychiatric:        Mood and Affect: Mood normal.        Behavior: Behavior normal.    ED Results / Procedures / Treatments   Labs (all labs ordered are listed, but only abnormal  results are displayed) Labs Reviewed  CBC WITH DIFFERENTIAL/PLATELET - Abnormal; Notable for the following components:      Result Value   WBC 19.6 (*)    Platelets 408 (*)  Neutro Abs 18.0 (*)    Lymphs Abs 0.6 (*)    Abs Immature Granulocytes 0.15 (*)    All other components within normal limits  BASIC METABOLIC PANEL - Abnormal; Notable for the following components:   Sodium 127 (*)    Chloride 94 (*)    CO2 17 (*)    Glucose, Bld 530 (*)    Anion gap 16 (*)    All other components within normal limits  BETA-HYDROXYBUTYRIC ACID - Abnormal; Notable for the following components:   Beta-Hydroxybutyric Acid 2.27 (*)    All other components within normal limits  CBG MONITORING, ED - Abnormal; Notable for the following components:   Glucose-Capillary 483 (*)    All other components within normal limits  I-STAT VENOUS BLOOD GAS, ED - Abnormal; Notable for the following components:   pCO2, Ven 36.4 (*)    Sodium 130 (*)    Calcium, Ion 1.12 (*)    All other components within normal limits  I-STAT BETA HCG BLOOD, ED ([facility], [facility], [facility] ONLY) - Abnormal; Notable for the following components:   I-stat hCG, quantitative 8.0 (*)    All other components within normal limits  CBG MONITORING, ED - Abnormal; Notable for the following components:   Glucose-Capillary 315 (*)    All other components within normal limits  RESP PANEL BY RT-PCR (FLU A&B, COVID) ARPGX2  URINALYSIS, ROUTINE W REFLEX MICROSCOPIC  HIV ANTIBODY (ROUTINE TESTING W REFLEX)  BASIC METABOLIC PANEL  BASIC METABOLIC PANEL  BASIC METABOLIC PANEL  BASIC METABOLIC PANEL  BETA-HYDROXYBUTYRIC ACID  BETA-HYDROXYBUTYRIC ACID  HEMOGLOBIN A1C  POC URINE PREG, ED   EKG None  Radiology No results found.  Procedures .Marland KitchenIncision and Drainage  Date/Time: 09/02/2020 8:25 PM Performed by: Rayna Sexton, PA-C Authorized by: Rayna Sexton, PA-C   Consent:    Consent obtained:  Verbal   Consent given by:  Patient   Risks,  benefits, and alternatives were discussed: yes     Risks discussed:  Bleeding, incomplete drainage and infection Universal protocol:    Patient identity confirmed:  Verbally with patient and arm band Location:    Type:  Abscess   Size:  4 cm   Location:  Head   Head location:  Scalp Pre-procedure details:    Skin preparation:  Chlorhexidine with alcohol Sedation:    Sedation type:  None Anesthesia:    Anesthesia method:  Local infiltration   Local anesthetic:  Lidocaine 2% WITH epi Procedure type:    Complexity:  Simple Procedure details:    Ultrasound guidance: no     Needle aspiration: yes     Needle size:  18 G   Drainage:  Purulent   Drainage amount:  Moderate   Wound treatment:  Wound left open   Packing materials:  None Post-procedure details:    Procedure completion:  Tolerated well, no immediate complications .Marland KitchenIncision and Drainage  Date/Time: 09/02/2020 8:26 PM Performed by: Rayna Sexton, PA-C Authorized by: Rayna Sexton, PA-C   Consent:    Consent obtained:  Verbal   Consent given by:  Patient   Risks, benefits, and alternatives were discussed: yes   Universal protocol:    Patient identity confirmed:  Arm band and verbally with patient Location:    Type:  Abscess   Size:  4 cm   Location:  Trunk   Trunk location:  Back Pre-procedure details:    Skin preparation:  Chlorhexidine with alcohol Sedation:    Sedation type:  None Anesthesia:  Anesthesia method:  Local infiltration   Local anesthetic:  Lidocaine 2% WITH epi Procedure type:    Complexity:  Simple Procedure details:    Needle aspiration: yes     Needle size:  18 G   Drainage:  Bloody   Drainage amount:  Scant   Wound treatment:  Wound left open   Packing materials:  None Post-procedure details:    Procedure completion:  Tolerated well, no immediate complications   (including critical care time)  Medications Ordered in ED Medications  lactated ringers bolus 1,000 mL (has  no administration in time range)  cefTRIAXone (ROCEPHIN) 1 g in sodium chloride 0.9 % 100 mL IVPB (has no administration in time range)  lidocaine-EPINEPHrine (XYLOCAINE W/EPI) 1 %-1:100000 (with pres) injection 10 mL (has no administration in time range)  lidocaine-EPINEPHrine-tetracaine (LET) topical gel (has no administration in time range)  acetaminophen (TYLENOL) tablet 650 mg (650 mg Oral Given 09/02/20 1658)   ED Course  I have reviewed the triage vital signs and the nursing notes.  Pertinent labs & imaging results that were available during my care of the patient were reviewed by me and considered in my medical decision making (see chart for details).  Clinical Course as of 09/02/20 1935  Fri Sep 02, 2020  1459 Anion gap(!): 16 [LJ]  1459 Glucose(!!): 530 [LJ]  1459 WBC(!): 19.6 [LJ]  1535 Sodium(!): 127 Corrected sodium for glucose is 134 [LJ]  1834 Beta-Hydroxybutyric Acid(!): 2.27 [LJ]    Clinical Course User Index [LJ] Rayna Sexton, PA-C   MDM Rules/Calculators/A&P                          Patient is a 29 year old female who presents the emergency department with multiple abscesses to the head and lower back.  Found to have an elevated white count as well.  Patient reports elevated blood sugars on arrival.  Patient found to have an initial glucose of 530.  Elevated anion gap at 16.  Elevated beta hydroxy butyrate.  Started on insulin drip as well as IV fluids for likely DKA.  I incised an abscess on the patient's lower back as well as right parietal scalp.  Please see procedure notes above.  Feel that given patient likely being in DKA as well as climbing white count, admission is reasonable.  Will discuss with hospitalist team for likely admission.  Final Clinical Impression(s) / ED Diagnoses Final diagnoses:  Diabetic ketoacidosis without coma associated with type 2 diabetes mellitus ([place])  Abscess   Rx / DC Orders ED Discharge Orders    None        Rayna Sexton, PA-C 09/02/20 2027    Maudie Flakes, MD 09/02/20 2351

## 2020-09-02 NOTE — ED Notes (Signed)
Suture cart, I&D Tray and Lidocaine at bedside.

## 2020-09-03 ENCOUNTER — Inpatient Hospital Stay (HOSPITAL_COMMUNITY): Payer: 59

## 2020-09-03 DIAGNOSIS — L0291 Cutaneous abscess, unspecified: Secondary | ICD-10-CM

## 2020-09-03 DIAGNOSIS — E1110 Type 2 diabetes mellitus with ketoacidosis without coma: Secondary | ICD-10-CM

## 2020-09-03 LAB — BASIC METABOLIC PANEL
Anion gap: 12 (ref 5–15)
Anion gap: 12 (ref 5–15)
Anion gap: 11 (ref 5–15)
BUN: <5 mg/dL — ABNORMAL LOW (ref 6–20)
BUN: <5 mg/dL — ABNORMAL LOW (ref 6–20)
BUN: <5 mg/dL — ABNORMAL LOW (ref 6–20)
CO2: 21 mmol/L — ABNORMAL LOW (ref 22–32)
CO2: 20 mmol/L — ABNORMAL LOW (ref 22–32)
CO2: 21 mmol/L — ABNORMAL LOW (ref 22–32)
Calcium: 8.7 mg/dL — ABNORMAL LOW (ref 8.9–10.3)
Calcium: 8.7 mg/dL — ABNORMAL LOW (ref 8.9–10.3)
Calcium: 8.4 mg/dL — ABNORMAL LOW (ref 8.9–10.3)
Chloride: 98 mmol/L (ref 98–111)
Chloride: 99 mmol/L (ref 98–111)
Chloride: 98 mmol/L (ref 98–111)
Creatinine, Ser: 0.52 mg/dL (ref 0.44–1.00)
Creatinine, Ser: 0.6 mg/dL (ref 0.44–1.00)
Creatinine, Ser: 0.57 mg/dL (ref 0.44–1.00)
GFR, Estimated: >60 mL/min (ref >60)
GFR, Estimated: >60 mL/min (ref >60)
GFR, Estimated: >60 mL/min (ref >60)
Glucose, Bld: 229 mg/dL — ABNORMAL HIGH (ref 70–99)
Glucose, Bld: 213 mg/dL — ABNORMAL HIGH (ref 70–99)
Glucose, Bld: 272 mg/dL — ABNORMAL HIGH (ref 70–99)
Potassium: 3.3 mmol/L — ABNORMAL LOW (ref 3.5–5.1)
Potassium: 3.5 mmol/L (ref 3.5–5.1)
Potassium: 3.1 mmol/L — ABNORMAL LOW (ref 3.5–5.1)
Sodium: 131 mmol/L — ABNORMAL LOW (ref 135–145)
Sodium: 131 mmol/L — ABNORMAL LOW (ref 135–145)
Sodium: 130 mmol/L — ABNORMAL LOW (ref 135–145)

## 2020-09-03 LAB — CBC
HCT: 34.1 % — ABNORMAL LOW (ref 36.0–46.0)
Hemoglobin: 11.6 g/dL — ABNORMAL LOW (ref 12.0–15.0)
MCH: 30.5 pg (ref 26.0–34.0)
MCHC: 34 g/dL (ref 30.0–36.0)
MCV: 89.7 fL (ref 80.0–100.0)
Platelets: 393 10*3/uL (ref 150–400)
RBC: 3.8 MIL/uL — ABNORMAL LOW (ref 3.87–5.11)
RDW: 11.8 % (ref 11.5–15.5)
WBC: 19.5 10*3/uL — ABNORMAL HIGH (ref 4.0–10.5)
nRBC: 0 % (ref 0.0–0.2)

## 2020-09-03 LAB — GLUCOSE, CAPILLARY
Glucose-Capillary: 122 mg/dL — ABNORMAL HIGH (ref 70–99)
Glucose-Capillary: 124 mg/dL — ABNORMAL HIGH (ref 70–99)
Glucose-Capillary: 167 mg/dL — ABNORMAL HIGH (ref 70–99)
Glucose-Capillary: 171 mg/dL — ABNORMAL HIGH (ref 70–99)
Glucose-Capillary: 193 mg/dL — ABNORMAL HIGH (ref 70–99)
Glucose-Capillary: 201 mg/dL — ABNORMAL HIGH (ref 70–99)
Glucose-Capillary: 205 mg/dL — ABNORMAL HIGH (ref 70–99)
Glucose-Capillary: 265 mg/dL — ABNORMAL HIGH (ref 70–99)
Glucose-Capillary: 270 mg/dL — ABNORMAL HIGH (ref 70–99)
Glucose-Capillary: 273 mg/dL — ABNORMAL HIGH (ref 70–99)
Glucose-Capillary: 280 mg/dL — ABNORMAL HIGH (ref 70–99)

## 2020-09-03 LAB — HIV ANTIBODY (ROUTINE TESTING W REFLEX): HIV Screen 4th Generation wRfx: NONREACTIVE

## 2020-09-03 LAB — BETA-HYDROXYBUTYRIC ACID
Beta-Hydroxybutyric Acid: 0.12 mmol/L (ref 0.05–0.27)
Beta-Hydroxybutyric Acid: 0.68 mmol/L — ABNORMAL HIGH (ref 0.05–0.27)

## 2020-09-03 MED ORDER — INSULIN STARTER KIT- PEN NEEDLES (ENGLISH)
1.0000 | Freq: Once | Status: AC
  Administered 2020-09-03: 1
  Filled 2020-09-03: qty 1

## 2020-09-03 MED ORDER — LIVING WELL WITH DIABETES BOOK
Freq: Once | Status: AC
  Filled 2020-09-03: qty 1

## 2020-09-03 MED ORDER — INSULIN ASPART 100 UNIT/ML ~~LOC~~ SOLN
0.0000 [IU] | Freq: Every day | SUBCUTANEOUS | Status: DC
  Administered 2020-09-03: 3 [IU] via SUBCUTANEOUS
  Administered 2020-09-04: 2 [IU] via SUBCUTANEOUS

## 2020-09-03 MED ORDER — OXYCODONE-ACETAMINOPHEN 5-325 MG PO TABS
1.0000 | ORAL_TABLET | ORAL | Status: DC | PRN
  Administered 2020-09-03 – 2020-09-06 (×18): 1 via ORAL
  Filled 2020-09-03 (×18): qty 1

## 2020-09-03 MED ORDER — INSULIN GLARGINE 100 UNIT/ML ~~LOC~~ SOLN
12.0000 [IU] | Freq: Every day | SUBCUTANEOUS | Status: DC
  Administered 2020-09-03 – 2020-09-04 (×2): 12 [IU] via SUBCUTANEOUS
  Filled 2020-09-03 (×3): qty 0.12

## 2020-09-03 MED ORDER — POTASSIUM CHLORIDE CRYS ER 20 MEQ PO TBCR
20.0000 meq | EXTENDED_RELEASE_TABLET | Freq: Once | ORAL | Status: AC
  Administered 2020-09-03: 20 meq via ORAL
  Filled 2020-09-03: qty 1

## 2020-09-03 MED ORDER — INSULIN ASPART 100 UNIT/ML ~~LOC~~ SOLN
3.0000 [IU] | Freq: Three times a day (TID) | SUBCUTANEOUS | Status: DC
  Administered 2020-09-03 – 2020-09-04 (×5): 3 [IU] via SUBCUTANEOUS

## 2020-09-03 MED ORDER — SULFAMETHOXAZOLE-TRIMETHOPRIM 800-160 MG PO TABS
2.0000 | ORAL_TABLET | Freq: Two times a day (BID) | ORAL | Status: DC
  Administered 2020-09-03 – 2020-09-04 (×4): 2 via ORAL
  Filled 2020-09-03 (×4): qty 2

## 2020-09-03 MED ORDER — INSULIN ASPART 100 UNIT/ML ~~LOC~~ SOLN
0.0000 [IU] | Freq: Three times a day (TID) | SUBCUTANEOUS | Status: DC
  Administered 2020-09-03: 3 [IU] via SUBCUTANEOUS
  Administered 2020-09-03: 2 [IU] via SUBCUTANEOUS
  Administered 2020-09-03: 5 [IU] via SUBCUTANEOUS
  Administered 2020-09-04: 3 [IU] via SUBCUTANEOUS
  Administered 2020-09-04: 2 [IU] via SUBCUTANEOUS
  Administered 2020-09-04: 5 [IU] via SUBCUTANEOUS
  Administered 2020-09-05: 2 [IU] via SUBCUTANEOUS
  Administered 2020-09-05: 3 [IU] via SUBCUTANEOUS
  Administered 2020-09-05 – 2020-09-06 (×3): 2 [IU] via SUBCUTANEOUS

## 2020-09-03 NOTE — Progress Notes (Addendum)
Inpatient Diabetes Program Recommendations  AACE/ADA: New Consensus Statement on Inpatient Glycemic Control (2015)  Target Ranges:  Prepandial:   less than 140 mg/dL      Peak postprandial:   less than 180 mg/dL (1-2 hours)      Critically ill patients:  140 - 180 mg/dL   Lab Results  Component Value Date   GLUCAP 201 (H) 09/03/2020   HGBA1C 11.4 (H) 09/02/2020    Review of Glycemic Control Results for OLIE, SCAFFIDI (MRN [id number]) as of 09/03/2020 09:41  Ref. Range 09/03/2020 05:21 09/03/2020 07:46 09/03/2020 08:20  Glucose-Capillary Latest Ref Range: 70 - 99 mg/dL 124 (H) 193 (H) 201 (H)   Diabetes history: Type 2 Dm Outpatient Diabetes medications: Metformin 1000 mg BID Current orders for Inpatient glycemic control: IV insulin to transition to: Novolog 0-9 units TID & HS, Novolog 3 units TID, Lantus 12 units QD  Inpatient Diabetes Program Recommendations:    Noted consult, A1C and plan for transition. Anticipate need for insulin at discharge. Ordered insulin starter kit and dietitian consult. Secure chat sent to RN to encourage self injection.   Spoke with patient briefly regarding diabetes. Verifies home medications.  Reviewed patient's current A1c of 11.4%. Explained what a A1c is and what it measures. Also reviewed goal A1c with patient, importance of good glucose control @ home, and blood sugar goals.  Discussed need for insulin, impact of glucose trends and infection, vascular changes and comorbidites.  Encouraged patient to begin learning and self injecting insulin and to anticipate potential need for insulin at discharge. Patient in agreement, however, is requesting to talk again at a later time due to pain level. Will continue to follow. RN to begin working with patient.    Thanks, Bronson Curb, MSN, RNC-OB Diabetes Coordinator [phone] (8a-5p)

## 2020-09-03 NOTE — Progress Notes (Signed)
PROGRESS NOTE    Misty [name]  UKG:[id number] DOB: 1990-10-13 DOA: 09/02/2020 PCP: Norm Salt, PA    Brief Narrative:  30 y.o. female with medical history significant for type 2 diabetes mellitus, hypertension, and eczema, presenting to the emergency department for evaluation of elevated blood sugar and abscesses involving her scalp and low back.  Patient reports a history of recurrent skin infections and developed abscesses involving her right parietal scalp and low back couple weeks ago.  These have become more tender and enlarged over the past few days.  She denies any associated fevers.  She also notes that her blood glucose has been elevated over the past week, often in the 300s.  She takes Metformin only at home but checks her sugars.  She came to the emergency department yesterday for evaluation of elevated glucose, had a CBG of 571, but left prior to completing her evaluation.  Glucose has continued to be elevated at home.  She denies any chest pain, abdominal pain, nausea, vomiting, cough, or shortness of breath.  ED Course: Upon arrival to the ED, patient is found to be afebrile, saturating well on room air, tachycardic to 130, and with stable blood pressure.  Chemistry panel is notable for glucose of 530 with low serum bicarbonate and elevated anion gap.  Beta hydroxybutyrate is elevated to 2.27.  CBC is notable for a leukocytosis to 19,600 and a mild thrombocytosis.  Skin abscesses were incised and drained in the emergency department and the patient was given 1.5 L of LR, 1 g Rocephin, and started on insulin infusion.  COVID-19 PCR is negative  Assessment & Plan:   Principal Problem:   DKA (diabetic ketoacidosis) (HCC) Active Problems:   Abscess   Hypertension   1. DKA, type II DM  - Type II diabetic on metformin only with no recent A1c presents with elevated CBGs at home and is found to have serum glucose of 530 with low serum bicarb, elevated AG, and BHOB of 2.27  -  She was given IVF and started on insulin infusion in ED  - A1c of 11.4 noted -DKA physiology resolved with IVF hydration, now transitioned to subq insulin -Cont to titrate insulin as needed -Repeat bmet in AM  2. Abscess, cellulitis  - Patient with hx of recurrent skin infections presents with abscesses involving low back and right parietal scalp with surrounding cellulitis  - Afebrile  -Pt is s/p I&D in ED  - Continue Bactrim -This AM, pt complains of marked residual pain, primarily in lower back -Have requested additional imaging, soft tissue US to r/o underlying drainable abscess   3. Hypertension  - BP at goal, continue Norvasc as tolerated   DVT prophylaxis: Lovenox subq Code Status: Full Family Communication: Pt in room, family not at bedside  Status is: Inpatient  Remains inpatient appropriate because:IV treatments appropriate due to intensity of illness or inability to take PO and Inpatient level of care appropriate due to severity of illness   Dispo: The patient is from: Home              Anticipated d/c is to: Home              Anticipated d/c date is: 2 days              Patient currently is not medically stable to d/c.  Consultants:     Procedures:     Antimicrobials: Anti-infectives (From admission, onward)   Start  Dose/Rate Route Frequency Ordered Stop   09/03/20 0100  sulfamethoxazole-trimethoprim (BACTRIM DS) 800-160 MG per tablet 2 tablet        2 tablet Oral Every 12 hours 09/03/20 0002     09/02/20 1630  cefTRIAXone (ROCEPHIN) 1 g in sodium chloride 0.9 % 100 mL IVPB        1 g 200 mL/hr over 30 Minutes Intravenous  Once 09/02/20 1623 09/02/20 1808       Subjective: Complaining of marked back and scalp pain, requesting additional pain meds  Objective: Vitals:   09/02/20 2324 09/03/20 0340 09/03/20 0700 09/03/20 1134  BP: 119/71 (!) 121/92  124/67  Pulse: (!) 109 (!) 110  (!) 110  Resp: 18 18  20   Temp: (!) 97.4 F (36.3 C) 100 F  (37.8 C) (!) 100.7 F (38.2 C) 100.2 F (37.9 C)  TempSrc: Oral Oral Oral Oral  SpO2: 97% 98%  98%  Height:       No intake or output data in the 24 hours ending 09/03/20 1414 There were no vitals filed for this visit.  Examination:  General exam: Appears calm and comfortable  Respiratory system: Clear to auscultation. Respiratory effort normal. Cardiovascular system: S1 & S2 heard, Regular Gastrointestinal system: Abdomen is nondistended, soft and nontender. No organomegaly or masses felt. Normal bowel sounds heard. Central nervous system: Alert and oriented. No focal neurological deficits. Extremities: Symmetric 5 x 5 power. Skin: No rashes, indurated tender lesions over scalp and L lower back without active drainage Psychiatry: Judgement and insight appear normal. Mood & affect appropriate.   Data Reviewed: I have personally reviewed following labs and imaging studies  CBC: Recent Labs  Lab 09/01/20 1559 09/02/20 1301 09/02/20 1737 09/03/20 0302  WBC 20.3* 19.6*  --  19.5*  NEUTROABS 18.6* 18.0*  --   --   HGB 13.1 12.6 13.9 11.6*  HCT 37.4 38.7 41.0 34.1*  MCV 90.3 93.9  --  89.7  PLT 417* 408*  --  393   Basic Metabolic Panel: Recent Labs  Lab 09/02/20 1301 09/02/20 1737 09/02/20 2225 09/03/20 0302 09/03/20 0847 09/03/20 1233  NA 127* 130* 132* 131* 131* 130*  K 3.9 3.8 3.3* 3.3* 3.5 3.1*  CL 94*  --  98 98 99 98  CO2 17*  --  21* 21* 20* 21*  GLUCOSE 530*  --  156* 229* 213* 272*  BUN 6  --  <5* <5* <5* <5*  CREATININE 0.79  --  0.56 0.52 0.60 0.57  CALCIUM 8.9  --  9.0 8.7* 8.7* 8.4*   GFR: Estimated Creatinine Clearance: 106.1 mL/min (by C-G formula based on SCr of 0.57 mg/dL). Liver Function Tests: Recent Labs  Lab 09/01/20 1559  AST 14*  ALT 16  ALKPHOS 134*  BILITOT 0.4  PROT 7.2  ALBUMIN 3.3*   No results for input(s): LIPASE, AMYLASE in the last 168 hours. No results for input(s): AMMONIA in the last 168 hours. Coagulation  Profile: No results for input(s): INR, PROTIME in the last 168 hours. Cardiac Enzymes: No results for input(s): CKTOTAL, CKMB, CKMBINDEX, TROPONINI in the last 168 hours. BNP (last 3 results) No results for input(s): PROBNP in the last 8760 hours. HbA1C: Recent Labs    09/02/20 2225  HGBA1C 11.4*   CBG: Recent Labs  Lab 09/03/20 0338 09/03/20 0521 09/03/20 0746 09/03/20 0820 09/03/20 1132  GLUCAP 205* 124* 193* 201* 171*   Lipid Profile: No results for input(s): CHOL, HDL, LDLCALC, TRIG, CHOLHDL, LDLDIRECT  in the last 72 hours. Thyroid Function Tests: No results for input(s): TSH, T4TOTAL, FREET4, T3FREE, THYROIDAB in the last 72 hours. Anemia Panel: No results for input(s): VITAMINB12, FOLATE, FERRITIN, TIBC, IRON, RETICCTPCT in the last 72 hours. Sepsis Labs: Recent Labs  Lab 09/01/20 1559  LATICACIDVEN 1.5    Recent Results (from the past 240 hour(s))  Resp Panel by RT-PCR (Flu A&B, Covid) Nasopharyngeal Swab     Status: None   Collection Time: 09/02/20  5:09 PM   Specimen: Nasopharyngeal Swab; Nasopharyngeal(NP) swabs in vial transport medium  Result Value Ref Range Status   SARS Coronavirus 2 by RT PCR NEGATIVE NEGATIVE Final    Comment: (NOTE) SARS-CoV-2 target nucleic acids are NOT DETECTED.  The SARS-CoV-2 RNA is generally detectable in upper respiratory specimens during the acute phase of infection. The lowest concentration of SARS-CoV-2 viral copies this assay can detect is 138 copies/mL. A negative result does not preclude SARS-Cov-2 infection and should not be used as the sole basis for treatment or other patient management decisions. A negative result may occur with  improper specimen collection/handling, submission of specimen other than nasopharyngeal swab, presence of viral mutation(s) within the areas targeted by this assay, and inadequate number of viral copies(<138 copies/mL). A negative result must be combined with clinical observations,  patient history, and epidemiological information. The expected result is Negative.  Fact Sheet for Patients:  EntrepreneurPulse.com.au  Fact Sheet for Healthcare Providers:  IncredibleEmployment.be  This test is no t yet approved or cleared by the [place] FDA and  has been authorized for detection and/or diagnosis of SARS-CoV-2 by FDA under an Emergency Use Authorization (EUA). This EUA will remain  in effect (meaning this test can be used) for the duration of the COVID-19 declaration under Section 564(b)(1) of the Act, 21 U.S.C.section 360bbb-3(b)(1), unless the authorization is terminated  or revoked sooner.       Influenza A by PCR NEGATIVE NEGATIVE Final   Influenza B by PCR NEGATIVE NEGATIVE Final    Comment: (NOTE) The Xpert Xpress SARS-CoV-2/FLU/RSV plus assay is intended as an aid in the diagnosis of influenza from Nasopharyngeal swab specimens and should not be used as a sole basis for treatment. Nasal washings and aspirates are unacceptable for Xpert Xpress SARS-CoV-2/FLU/RSV testing.  Fact Sheet for Patients: EntrepreneurPulse.com.au  Fact Sheet for Healthcare Providers: IncredibleEmployment.be  This test is not yet approved or cleared by the [place] FDA and has been authorized for detection and/or diagnosis of SARS-CoV-2 by FDA under an Emergency Use Authorization (EUA). This EUA will remain in effect (meaning this test can be used) for the duration of the COVID-19 declaration under Section 564(b)(1) of the Act, 21 U.S.C. section 360bbb-3(b)(1), unless the authorization is terminated or revoked.  Performed at [facility], [place] [address] [place], [place] [address]      Radiology Studies: No results found.  Scheduled Meds: . amLODipine  5 mg Oral Daily  . enoxaparin (LOVENOX) injection  40 mg Subcutaneous Q24H  . insulin aspart  0-5 Units Subcutaneous QHS  .  insulin aspart  0-9 Units Subcutaneous TID WC  . insulin aspart  3 Units Subcutaneous TID WC  . insulin glargine  12 Units Subcutaneous Daily  . sulfamethoxazole-trimethoprim  2 tablet Oral Q12H   Continuous Infusions: . dextrose 5% lactated ringers Stopped (09/03/20 0810)  . insulin Stopped (09/03/20 0810)     LOS: 1 day   Marylu Lund, MD [facility] Pager On Amion  If 7PM-7AM,  please contact night-coverage 09/03/2020, 2:14 PM

## 2020-09-03 NOTE — L&D Delivery Note (Signed)
OB/GYN Faculty Practice Delivery Note  Misty [name] is a 30 y.o. [id number] s/p SVD at [redacted]w[redacted]d. 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   ROM: 12h 27m with clear fluid GBS Status: unknown, treated with PCN Maximum Maternal Temperature: 98.4  Labor Progress: Patient was transferred from [facility] specialty for induction, which was started with cytotec x2, and then she had a foley bulb placed and ultimately started on pitocin. Her cervix remained unchanged in terms of dilation and her membranes were ultimately ruptured. She was given a pitocin break, and once her pitocin was restarted, she ultimately progressed to complete.   Delivery Date/Time: [id number] at 06/22/21 Delivery: Called to room and patient was complete and pushing. Head delivered OA. No nuchal cord present. Shoulder and body delivered in usual fashion.Cord clamped x 2 cut by Dr. Ephriam Jenkins immediately due to preterm state and tone and infant passed over to awaiting NICU team where she received NRP and was ultimate intubated and was taken to the NICU.  Cord Gas was sent and was found to have pH of 7.2. Cord blood was drawn. Placenta delivered spontaneously with gentle cord traction. Upon delivery of placenta fundus firm with massage, however lower uterine segment found to have blood clots and low tone. The clots were removed with manual sweep and patient was given TXA and Cytotec and pitocin. Upon these interventions patient had no more active bleeding and her fundus was firm.Labia, perineum, vagina, and cervix inspected and found to have a hemostatic periurethral abrasion that was not repaired.  Placenta: intact, 3V cord, sent to pathology Complications: Clots in lower uterine segment with some lower uterine atony, TXA and Cytotec given Lacerations: None EBL: 150cc Analgesia: Epidural  Infant: female  APGARs 5,7  2100g  Warner Mccreedy, MD, MPH OB Fellow, Faculty [facility] for [facility], [facility] 06/22/2021, 8:43 AM

## 2020-09-04 DIAGNOSIS — I10 Essential (primary) hypertension: Secondary | ICD-10-CM

## 2020-09-04 LAB — BASIC METABOLIC PANEL
Anion gap: 10 (ref 5–15)
BUN: <5 mg/dL — ABNORMAL LOW (ref 6–20)
CO2: 20 mmol/L — ABNORMAL LOW (ref 22–32)
Calcium: 8.2 mg/dL — ABNORMAL LOW (ref 8.9–10.3)
Chloride: 98 mmol/L (ref 98–111)
Creatinine, Ser: 0.58 mg/dL (ref 0.44–1.00)
GFR, Estimated: >60 mL/min (ref >60)
Glucose, Bld: 263 mg/dL — ABNORMAL HIGH (ref 70–99)
Potassium: 3.4 mmol/L — ABNORMAL LOW (ref 3.5–5.1)
Sodium: 128 mmol/L — ABNORMAL LOW (ref 135–145)

## 2020-09-04 LAB — CBC
HCT: 31.8 % — ABNORMAL LOW (ref 36.0–46.0)
Hemoglobin: 11.3 g/dL — ABNORMAL LOW (ref 12.0–15.0)
MCH: 31.6 pg (ref 26.0–34.0)
MCHC: 35.5 g/dL (ref 30.0–36.0)
MCV: 88.8 fL (ref 80.0–100.0)
Platelets: 389 10*3/uL (ref 150–400)
RBC: 3.58 MIL/uL — ABNORMAL LOW (ref 3.87–5.11)
RDW: 11.9 % (ref 11.5–15.5)
WBC: 14.8 10*3/uL — ABNORMAL HIGH (ref 4.0–10.5)
nRBC: 0 % (ref 0.0–0.2)

## 2020-09-04 LAB — GLUCOSE, CAPILLARY
Glucose-Capillary: 247 mg/dL — ABNORMAL HIGH (ref 70–99)
Glucose-Capillary: 270 mg/dL — ABNORMAL HIGH (ref 70–99)
Glucose-Capillary: 196 mg/dL — ABNORMAL HIGH (ref 70–99)
Glucose-Capillary: 236 mg/dL — ABNORMAL HIGH (ref 70–99)

## 2020-09-04 MED ORDER — VANCOMYCIN HCL 1500 MG/300ML IV SOLN
1500.0000 mg | Freq: Two times a day (BID) | INTRAVENOUS | Status: DC
  Administered 2020-09-04 – 2020-09-06 (×4): 1500 mg via INTRAVENOUS
  Filled 2020-09-04 (×6): qty 300

## 2020-09-04 MED ORDER — INSULIN GLARGINE 100 UNIT/ML ~~LOC~~ SOLN
18.0000 [IU] | Freq: Every day | SUBCUTANEOUS | Status: DC
  Administered 2020-09-05 – 2020-09-06 (×2): 18 [IU] via SUBCUTANEOUS
  Filled 2020-09-04 (×2): qty 0.18

## 2020-09-04 MED ORDER — GLUCERNA SHAKE PO LIQD
237.0000 mL | Freq: Three times a day (TID) | ORAL | Status: DC
  Administered 2020-09-04 – 2020-09-06 (×5): 237 mL via ORAL

## 2020-09-04 MED ORDER — INSULIN GLARGINE 100 UNIT/ML ~~LOC~~ SOLN
6.0000 [IU] | Freq: Once | SUBCUTANEOUS | Status: AC
  Administered 2020-09-04: 6 [IU] via SUBCUTANEOUS
  Filled 2020-09-04: qty 0.06

## 2020-09-04 MED ORDER — INSULIN ASPART 100 UNIT/ML ~~LOC~~ SOLN
5.0000 [IU] | Freq: Three times a day (TID) | SUBCUTANEOUS | Status: DC
  Administered 2020-09-04 – 2020-09-06 (×5): 5 [IU] via SUBCUTANEOUS

## 2020-09-04 MED ORDER — VANCOMYCIN HCL IN DEXTROSE 1-5 GM/200ML-% IV SOLN
1000.0000 mg | Freq: Once | INTRAVENOUS | Status: DC
  Filled 2020-09-04: qty 200

## 2020-09-04 MED ORDER — SODIUM CHLORIDE 0.9 % IV SOLN: INTRAVENOUS | Status: DC

## 2020-09-04 MED ORDER — METOCLOPRAMIDE HCL 5 MG PO TABS
5.0000 mg | ORAL_TABLET | Freq: Three times a day (TID) | ORAL | Status: DC
  Administered 2020-09-04 – 2020-09-06 (×6): 5 mg via ORAL
  Filled 2020-09-04 (×6): qty 1

## 2020-09-04 MED ORDER — LIP MEDEX EX OINT
TOPICAL_OINTMENT | CUTANEOUS | Status: DC | PRN
  Filled 2020-09-04: qty 7

## 2020-09-04 NOTE — Progress Notes (Signed)
Inpatient Diabetes Program Recommendations  AACE/ADA: New Consensus Statement on Inpatient Glycemic Control (2015)  Target Ranges:  Prepandial:   less than 140 mg/dL      Peak postprandial:   less than 180 mg/dL (1-2 hours)      Critically ill patients:  140 - 180 mg/dL   Lab Results  Component Value Date   GLUCAP 247 (H) 09/04/2020   HGBA1C 11.4 (H) 09/02/2020    Review of Glycemic Control  Diabetes history: Type 2 Dm Outpatient Diabetes medications: Metformin 1000 mg BID Current orders for Inpatient glycemic control: IV insulin to transition to: Novolog 0-9 units TID & HS, Novolog 3 units TID, Lantus 12 units QD  Inpatient Diabetes Program Recommendations:    Consider increasing Lantus to 18 units QD and increasing Novolog to 5 units TID (assuming patient consuming >50% of meals).  Thanks, Lujean Rave, MSN, RNC-OB Diabetes Coordinator [phone] (8a-5p)

## 2020-09-04 NOTE — Discharge Instructions (Signed)

## 2020-09-04 NOTE — Progress Notes (Signed)
Pharmacy Antibiotic Note  Misty [name] is a 30 y.o. female admitted on 09/02/2020 with cellulitis. Patient is s/p I&D in the ED but ultrasound is concerning for underlying abscess. Pharmacy has been consulted for vancomycin dosing.  Plan:  Vancomycin 1500 IV every 12 hours.  Goal trough 10-15 mcg/mL. Monitor renal function, clinical status, C&S, VT as needed.   Height: 5' 3.5" (161.3 cm) IBW/kg (Calculated) : 53.55  Temp (24hrs), Avg:99.2 F (37.3 C), Min:98.7 F (37.1 C), Max:99.8 F (37.7 C)  Recent Labs  Lab 09/01/20 1559 09/02/20 1301 09/02/20 2225 09/03/20 0302 09/03/20 0847 09/03/20 1233 09/04/20 0241  WBC 20.3* 19.6*  --  19.5*  --   --  14.8*  CREATININE 0.68 0.79 0.56 0.52 0.60 0.57 0.58  LATICACIDVEN 1.5  --   --   --   --   --   --     Estimated Creatinine Clearance: 106.1 mL/min (by C-G formula based on SCr of 0.58 mg/dL).    Allergies  Allergen Reactions  . Iodides Anaphylaxis and Itching  . Morphine And Related Anaphylaxis and Itching  . Shellfish Allergy Anaphylaxis and Itching    Pt reports "watery eyes"  . Ultram [Tramadol Hcl] Hives    Hives and swollen lips     Antimicrobials this admission: Bactrim 1/ > 1/2 Vancomycin 1/2 >  Thank you for allowing pharmacy to be a part of this patient's care.  Kinnie Feil, PharmD PGY1 Acute Care Pharmacy Resident Phone: [phone] 09/04/2020 3:21 PM  Please check [email or web address] for unit specific pharmacy phone numbers.

## 2020-09-04 NOTE — Progress Notes (Signed)
PROGRESS NOTE    Misty [name]  MEQ:[id number] DOB: [birth year] DOA: 09/02/2020 PCP: Norm Salt, PA    Brief Narrative:  30 y.o. female with medical history significant for type 2 diabetes mellitus, hypertension, and eczema, presenting to the emergency department for evaluation of elevated blood sugar and abscesses involving her scalp and low back.  Patient reports a history of recurrent skin infections and developed abscesses involving her right parietal scalp and low back couple weeks ago.  These have become more tender and enlarged over the past few days.  She denies any associated fevers.  She also notes that her blood glucose has been elevated over the past week, often in the 300s.  She takes Metformin only at home but checks her sugars.  She came to the emergency department yesterday for evaluation of elevated glucose, had a CBG of 571, but left prior to completing her evaluation.  Glucose has continued to be elevated at home.  She denies any chest pain, abdominal pain, nausea, vomiting, cough, or shortness of breath.  ED Course: Upon arrival to the ED, patient is found to be afebrile, saturating well on room air, tachycardic to 130, and with stable blood pressure.  Chemistry panel is notable for glucose of 530 with low serum bicarbonate and elevated anion gap.  Beta hydroxybutyrate is elevated to 2.27.  CBC is notable for a leukocytosis to 19,600 and a mild thrombocytosis.  Skin abscesses were incised and drained in the emergency department and the patient was given 1.5 L of LR, 1 g Rocephin, and started on insulin infusion.  COVID-19 PCR is negative  Assessment & Plan:   Principal Problem:   DKA (diabetic ketoacidosis) (HCC) Active Problems:   Abscess   Hypertension   1. DKA, type II DM  - Type II diabetic on metformin only with no recent A1c presents with elevated CBGs at home and is found to have serum glucose of 530 with low serum bicarb, elevated AG, and BHOB of 2.27  -  She was given IVF and started on insulin infusion in ED  - A1c of 11.4 noted -DKA physiology resolved with IVF hydration, now transitioned to subq insulin -cont to titrate insulin as tolerated -Repeat bmet in AM  2. Abscess, cellulitis with sepsis present on admission - Patient with hx of recurrent skin infections presents with abscesses involving low back and right parietal scalp with surrounding cellulitis  - Tmax of 100.1F less than 24hrs ago with continued leukocytosis and tachycardia -Pt is s/p I&D in ED  - Was initially continued on Bactrim -US soft tissue reviewed. Findings worrisome for complex hypoechoic area within the L lower back, likely an area of developing phlegmon/abscess. No drainable fluid at this time, however given poorly controlled diabetes and sepsis physiology, will broaden abx to vancomycin. Have discussed with pharmacy -recheck cbc and bmet in AM  3. Hypertension  - BP stable, continue Norvasc as tolerated   4. Hyponatremia -Worsening  -dry mucus membranes on exam -suspect dehydration -Have started NS at 75cc/hr -Repeat bmet in AM  DVT prophylaxis: Lovenox subq Code Status: Full Family Communication: Pt in room, family not at bedside  Status is: Inpatient  Remains inpatient appropriate because:IV treatments appropriate due to intensity of illness or inability to take PO and Inpatient level of care appropriate due to severity of illness   Dispo: The patient is from: Home              Anticipated d/c is to: Home  Anticipated d/c date is: 2 days              Patient currently is not medically stable to d/c.  Consultants:     Procedures:     Antimicrobials: Anti-infectives (From admission, onward)   Start     Dose/Rate Route Frequency Ordered Stop   09/03/20 0100  sulfamethoxazole-trimethoprim (BACTRIM DS) 800-160 MG per tablet 2 tablet        2 tablet Oral Every 12 hours 09/03/20 0002     09/02/20 1630  cefTRIAXone (ROCEPHIN) 1 g  in sodium chloride 0.9 % 100 mL IVPB        1 g 200 mL/hr over 30 Minutes Intravenous  Once 09/02/20 1623 09/02/20 1808      Subjective: Reports continued pain in back  Objective: Vitals:   09/04/20 0353 09/04/20 0851 09/04/20 0921 09/04/20 1125  BP:  127/70  117/60  Pulse:  100 95 96  Resp: 11 18 18 13   Temp:  99.5 F (37.5 C)  99 F (37.2 C)  TempSrc:  Oral  Oral  SpO2:  96% 90% 95%  Height:        Intake/Output Summary (Last 24 hours) at 09/04/2020 1501 Last data filed at 09/04/2020 0300 Gross per 24 hour  Intake 914.48 ml  Output -  Net 914.48 ml   There were no vitals filed for this visit.  Examination: General exam: Conversant, in no acute distress, dry mucus membranes Respiratory system: normal chest rise, clear, no audible wheezing Cardiovascular system: regular rhythm, s1-s2 Gastrointestinal system: Nondistended, nontender, pos BS Central nervous system: No seizures, no tremors Extremities: No cyanosis, no joint deformities Skin: No rashes, no pallor Psychiatry: Affect normal // no auditory hallucinations   Data Reviewed: I have personally reviewed following labs and imaging studies  CBC: Recent Labs  Lab 09/01/20 1559 09/02/20 1301 09/02/20 1737 09/03/20 0302 09/04/20 0241  WBC 20.3* 19.6*  --  19.5* 14.8*  NEUTROABS 18.6* 18.0*  --   --   --   HGB 13.1 12.6 13.9 11.6* 11.3*  HCT 37.4 38.7 41.0 34.1* 31.8*  MCV 90.3 93.9  --  89.7 88.8  PLT 417* 408*  --  393 389   Basic Metabolic Panel: Recent Labs  Lab 09/02/20 2225 09/03/20 0302 09/03/20 0847 09/03/20 1233 09/04/20 0241  NA 132* 131* 131* 130* 128*  K 3.3* 3.3* 3.5 3.1* 3.4*  CL 98 98 99 98 98  CO2 21* 21* 20* 21* 20*  GLUCOSE 156* 229* 213* 272* 263*  BUN <5* <5* <5* <5* <5*  CREATININE 0.56 0.52 0.60 0.57 0.58  CALCIUM 9.0 8.7* 8.7* 8.4* 8.2*   GFR: Estimated Creatinine Clearance: 106.1 mL/min (by C-G formula based on SCr of 0.58 mg/dL). Liver Function Tests: Recent Labs  Lab  09/01/20 1559  AST 14*  ALT 16  ALKPHOS 134*  BILITOT 0.4  PROT 7.2  ALBUMIN 3.3*   No results for input(s): LIPASE, AMYLASE in the last 168 hours. No results for input(s): AMMONIA in the last 168 hours. Coagulation Profile: No results for input(s): INR, PROTIME in the last 168 hours. Cardiac Enzymes: No results for input(s): CKTOTAL, CKMB, CKMBINDEX, TROPONINI in the last 168 hours. BNP (last 3 results) No results for input(s): PROBNP in the last 8760 hours. HbA1C: Recent Labs    09/02/20 2225  HGBA1C 11.4*   CBG: Recent Labs  Lab 09/03/20 1613 09/03/20 1944 09/03/20 2116 09/04/20 0529 09/04/20 1124  GLUCAP 265* 270* 280* 247* 270*  Lipid Profile: No results for input(s): CHOL, HDL, LDLCALC, TRIG, CHOLHDL, LDLDIRECT in the last 72 hours. Thyroid Function Tests: No results for input(s): TSH, T4TOTAL, FREET4, T3FREE, THYROIDAB in the last 72 hours. Anemia Panel: No results for input(s): VITAMINB12, FOLATE, FERRITIN, TIBC, IRON, RETICCTPCT in the last 72 hours. Sepsis Labs: Recent Labs  Lab 09/01/20 1559  LATICACIDVEN 1.5    Recent Results (from the past 240 hour(s))  Resp Panel by RT-PCR (Flu A&B, Covid) Nasopharyngeal Swab     Status: None   Collection Time: 09/02/20  5:09 PM   Specimen: Nasopharyngeal Swab; Nasopharyngeal(NP) swabs in vial transport medium  Result Value Ref Range Status   SARS Coronavirus 2 by RT PCR NEGATIVE NEGATIVE Final    Comment: (NOTE) SARS-CoV-2 target nucleic acids are NOT DETECTED.  The SARS-CoV-2 RNA is generally detectable in upper respiratory specimens during the acute phase of infection. The lowest concentration of SARS-CoV-2 viral copies this assay can detect is 138 copies/mL. A negative result does not preclude SARS-Cov-2 infection and should not be used as the sole basis for treatment or other patient management decisions. A negative result may occur with  improper specimen collection/handling, submission of specimen  other than nasopharyngeal swab, presence of viral mutation(s) within the areas targeted by this assay, and inadequate number of viral copies(<138 copies/mL). A negative result must be combined with clinical observations, patient history, and epidemiological information. The expected result is Negative.  Fact Sheet for Patients:  BloggerCourse.com  Fact Sheet for Healthcare Providers:  SeriousBroker.it  This test is no t yet approved or cleared by the [place] FDA and  has been authorized for detection and/or diagnosis of SARS-CoV-2 by FDA under an Emergency Use Authorization (EUA). This EUA will remain  in effect (meaning this test can be used) for the duration of the COVID-19 declaration under Section 564(b)(1) of the Act, 21 U.S.C.section 360bbb-3(b)(1), unless the authorization is terminated  or revoked sooner.       Influenza A by PCR NEGATIVE NEGATIVE Final   Influenza B by PCR NEGATIVE NEGATIVE Final    Comment: (NOTE) The Xpert Xpress SARS-CoV-2/FLU/RSV plus assay is intended as an aid in the diagnosis of influenza from Nasopharyngeal swab specimens and should not be used as a sole basis for treatment. Nasal washings and aspirates are unacceptable for Xpert Xpress SARS-CoV-2/FLU/RSV testing.  Fact Sheet for Patients: BloggerCourse.com  Fact Sheet for Healthcare Providers: SeriousBroker.it  This test is not yet approved or cleared by the [place] FDA and has been authorized for detection and/or diagnosis of SARS-CoV-2 by FDA under an Emergency Use Authorization (EUA). This EUA will remain in effect (meaning this test can be used) for the duration of the COVID-19 declaration under Section 564(b)(1) of the Act, 21 U.S.C. section 360bbb-3(b)(1), unless the authorization is terminated or revoked.  Performed at [facility], [address], [place],  [place] [address]      Radiology Studies: US Abdomen Limited  Result Date: 09/03/2020 CLINICAL DATA:  Left lower back area concerning for abscess. Recent I and D EXAM: ULTRASOUND ABDOMEN LIMITED COMPARISON:  CT 02/25/2020 FINDINGS: There is a hypoechoic area noted within the left lower back in the area of concern measuring 3.5 x 2.9 x 2.0 cm. Mixed echogenicity centrally. There may be a small amount of fluid, but this appears to represent more of an area of phlegmonous change rather than well-defined drainable abscess. IMPRESSION: Complex hypoechoic area within the left lower back, likely area of developing phlegmon/abscess  without well-defined abscess or drainable fluid at this time. Electronically Signed   By: Rolm Baptise M.D.   On: 09/03/2020 16:48    Scheduled Meds: . amLODipine  5 mg Oral Daily  . enoxaparin (LOVENOX) injection  40 mg Subcutaneous Q24H  . feeding supplement (GLUCERNA SHAKE)  237 mL Oral TID BM  . insulin aspart  0-5 Units Subcutaneous QHS  . insulin aspart  0-9 Units Subcutaneous TID WC  . insulin aspart  5 Units Subcutaneous TID WC  . [START ON 09/05/2020] insulin glargine  18 Units Subcutaneous Daily  . insulin glargine  6 Units Subcutaneous Once  . metoCLOPramide  5 mg Oral TID AC  . sulfamethoxazole-trimethoprim  2 tablet Oral Q12H   Continuous Infusions: . sodium chloride 75 mL/hr at 09/04/20 1237  . dextrose 5% lactated ringers Stopped (09/03/20 0810)  . insulin Stopped (09/03/20 0810)     LOS: 2 days   Marylu Lund, MD [facility] Pager On Amion  If 7PM-7AM, please contact night-coverage 09/04/2020, 3:01 PM

## 2020-09-04 NOTE — Progress Notes (Signed)
Initial Nutrition Assessment  RD working remotely.  DOCUMENTATION CODES:   Obesity unspecified  INTERVENTION:   - Diabetes diet education provided and handouts attached to AVS/Discharge Instructions  - Glucerna Shake po TID, each supplement provides 220 kcal and 10 grams of protein  - Encourage adequate PO intake  NUTRITION DIAGNOSIS:   Increased nutrient needs related to wound healing as evidenced by estimated needs.  GOAL:   Patient will meet greater than or equal to 90% of their needs  MONITOR:   PO intake,Supplement acceptance,Labs,Weight trends,Skin  REASON FOR ASSESSMENT:   Consult Diet education  ASSESSMENT:   30 year old female who presented to the ED on 12/31 with complaints of abscess involving scalp and lower back. PMH of T2DM, HTN, eczema. Admitted with DKA, cellulitis.   Per notes, skin abscesses were incised and drained in the ED.  Received consult for diabetes diet education. Per Diabetes Coordinator note, anticipate need for insulin at discharge.  Pt is on a carb modified diet with no meal completions documented since admission.  Spoke with pt via phone call to room. Pt reports decreased appetite during admission and for about 1 week PTA. Pt states that at home she was eating "light meals." If she didn't feel like eating, she would drink an Ensure. Pt reports that during admission, she has mostly been consuming fruit.  Pt reports that at home she eats depending on if she is hungry and what her schedule looks like. She does not always eat 3 meals daily. Pt drinks water and regular ginger ale.  Pt states that she does not check her blood sugars at home. She feels uneasy doing this and plans to have a friend or family member help her with this. Recommend pt practice blood sugar checks and insulin administration during admission so that she feels more comfortable with these tasks at discharge.  RD has attached "Carbohydrate Counting for People with  Diabetes" handout from the Academy of Nutrition and Dietetics to pt's AVS/Discharge Instructions. Discussed different food groups and their effects on blood sugar, emphasizing carbohydrate-containing foods. Provided list of carbohydrates and recommended serving sizes of common foods.  Discussed importance of controlled and consistent carbohydrate intake throughout the day. Recommended pt not skip meals and to eat every 3-4 hours. Provided examples of ways to balance meals/snacks and encouraged intake of high-fiber, whole grain complex carbohydrates. Teach back method used.  Expect fair compliance.  Given pt with decreased appetite and PO intake, RD to order Glucerna Shakes TID between meals.  Pt is not sure how much she normally weighs and does not know if she has lost weight recently. Reviewed weight history in chart. Pt's weight has fluctuated between 78-83 kg over the last 2 months. Prior to this, pt weighed 90.7 kg in May 2020. Suspect pt has sustained some weight loss related to uncontrolled diabetes. Will attempt NFPE at follow-up to assess for malnutrition.  Medications reviewed and include: SSI, novolog 3 units TID with meals, lantus 12 units daily, reglan 5 mg TID before meals IVF: NS @ 75 ml/hr  Labs reviewed: sodium 128, potassium 3.4 CBG's: 247-280 x 24 hours  NUTRITION - FOCUSED PHYSICAL EXAM:  Unable to complete at this time. RD working remotely.  Diet Order:   Diet Order            Diet Carb Modified Fluid consistency: Thin; Room service appropriate? Yes with Assist  Diet effective now  EDUCATION NEEDS:   Education needs have been addressed  Skin:  Skin Assessment: Skin Integrity Issues: Other: cellulitis to back and head  Last BM:  no documented BM  Height:   Ht Readings from Last 1 Encounters:  09/02/20 5' 3.5" (1.613 m)    Weight:   Wt Readings from Last 1 Encounters:  08/27/20 81.6 kg    BMI:  Body mass index is 31.39  kg/m.  Estimated Nutritional Needs:   Kcal:  1800-2000  Protein:  85-100 grams  Fluid:  1.8-2.0 L    Mertie Clause, MS, RD, LDN Inpatient Clinical Dietitian Please see AMiON for contact information.

## 2020-09-05 ENCOUNTER — Encounter (HOSPITAL_COMMUNITY): Payer: Self-pay | Admitting: Family Medicine

## 2020-09-05 LAB — CBC
HCT: 31.5 % — ABNORMAL LOW (ref 36.0–46.0)
Hemoglobin: 10.7 g/dL — ABNORMAL LOW (ref 12.0–15.0)
MCH: 30.1 pg (ref 26.0–34.0)
MCHC: 34 g/dL (ref 30.0–36.0)
MCV: 88.7 fL (ref 80.0–100.0)
Platelets: 395 10*3/uL (ref 150–400)
RBC: 3.55 MIL/uL — ABNORMAL LOW (ref 3.87–5.11)
RDW: 11.9 % (ref 11.5–15.5)
WBC: 9.7 10*3/uL (ref 4.0–10.5)
nRBC: 0 % (ref 0.0–0.2)

## 2020-09-05 LAB — BASIC METABOLIC PANEL
Anion gap: 10 (ref 5–15)
BUN: <5 mg/dL — ABNORMAL LOW (ref 6–20)
CO2: 22 mmol/L (ref 22–32)
Calcium: 8.3 mg/dL — ABNORMAL LOW (ref 8.9–10.3)
Chloride: 100 mmol/L (ref 98–111)
Creatinine, Ser: 0.51 mg/dL (ref 0.44–1.00)
GFR, Estimated: >60 mL/min (ref >60)
Glucose, Bld: 170 mg/dL — ABNORMAL HIGH (ref 70–99)
Potassium: 3 mmol/L — ABNORMAL LOW (ref 3.5–5.1)
Sodium: 132 mmol/L — ABNORMAL LOW (ref 135–145)

## 2020-09-05 LAB — GLUCOSE, CAPILLARY
Glucose-Capillary: 167 mg/dL — ABNORMAL HIGH (ref 70–99)
Glucose-Capillary: 177 mg/dL — ABNORMAL HIGH (ref 70–99)
Glucose-Capillary: 266 mg/dL — ABNORMAL HIGH (ref 70–99)
Glucose-Capillary: 173 mg/dL — ABNORMAL HIGH (ref 70–99)

## 2020-09-05 MED ORDER — POTASSIUM CHLORIDE CRYS ER 20 MEQ PO TBCR
40.0000 meq | EXTENDED_RELEASE_TABLET | Freq: Two times a day (BID) | ORAL | Status: AC
  Administered 2020-09-05 (×2): 40 meq via ORAL
  Filled 2020-09-05 (×2): qty 2

## 2020-09-05 MED ORDER — INSULIN STARTER KIT- PEN NEEDLES (ENGLISH)
1.0000 | Freq: Once | Status: AC
  Administered 2020-09-05: 1
  Filled 2020-09-05: qty 1

## 2020-09-05 NOTE — TOC Initial Note (Signed)
Transition of Care (TOC) - Initial/Assessment Note  Donn Pierini RN, BSN Transitions of Care Unit 4E- RN Case Manager See Treatment Team for direct phone #    Patient Details  Name: SHARNESE HEATH MRN: [id number] Date of Birth: [birth year]  Transition of Care [facility]) CM/SW Contact:    Darrold Span, RN Phone Number: 09/05/2020, 3:51 PM  Clinical Narrative:                 Pt admitted with DKA, from home, referral received for PCP and medication needs. CM spoke with pt at bedside regarding request for new PCP. Per pt she does not wish to continue to see her old provider and is requesting assistance in finding a new PCP- pt would prefer the [facility] as it is close to her home, or the [facility].  Confirmed insurance coverage, address, phone # with pt.   Benefits check submitted for insulin needs- pending  Call made to [facility] and f/u appointment made for Jan. 26 at 9:50- info placed on AVS- will f/u with pt tomorrow once benefits check returned on insulin.   Expected Discharge Plan: Home/Self Care Barriers to Discharge: Continued Medical Work up   Patient Goals and CMS Choice Patient states their goals for this hospitalization and ongoing recovery are:: return home   Choice offered to / list presented to : NA  Expected Discharge Plan and Services Expected Discharge Plan: Home/Self Care   Discharge Planning Services: CM Consult,Indigent Health Clinic,Follow-up appt scheduled,Medication Assistance   Living arrangements for the past 2 months: Apartment                 DME Arranged: N/A DME Agency: NA       HH Arranged: NA HH Agency: NA        Prior Living Arrangements/Services Living arrangements for the past 2 months: Apartment Lives with:: Self,Significant Other Patient language and need for interpreter reviewed:: Yes Do you feel safe going back to the place where you live?: Yes      Need for Family Participation in Patient Care: Yes  (Comment) Care giver support system in place?: Yes (comment)   Criminal Activity/Legal Involvement Pertinent to Current Situation/Hospitalization: No - Comment as needed  Activities of Daily Living Home Assistive Devices/Equipment: CBG Meter ADL Screening (condition at time of admission) Patient's cognitive ability adequate to safely complete daily activities?: Yes Is the patient deaf or have difficulty hearing?: No Does the patient have difficulty seeing, even when wearing glasses/contacts?: No Does the patient have difficulty concentrating, remembering, or making decisions?: No Patient able to express need for assistance with ADLs?: Yes Does the patient have difficulty dressing or bathing?: No Independently performs ADLs?: Yes (appropriate for developmental age) Does the patient have difficulty walking or climbing stairs?: No Weakness of Legs: None Weakness of Arms/Hands: None  Permission Sought/Granted                  Emotional Assessment Appearance:: Appears stated age Attitude/Demeanor/Rapport: Engaged Affect (typically observed): Appropriate Orientation: : Oriented to Self,Oriented to Place,Oriented to  Time,Oriented to Situation Alcohol / Substance Use: Not Applicable Psych Involvement: No (comment)  Admission diagnosis:  Abscess [L02.91] DKA (diabetic ketoacidosis) (HCC) [E11.10] Diabetic ketoacidosis without coma associated with type 2 diabetes mellitus (HCC) [E11.10] Patient Active Problem List   Diagnosis Date Noted  . DKA (diabetic ketoacidosis) (HCC) 09/02/2020  . Abscess 09/02/2020  . Hypertension 09/2013   PCP:  Norm Salt, PA Pharmacy:   [facility] -  [place], [place] - [address] [place] [address] Phone: [phone] Fax: [phone]     Social Determinants of Health (SDOH) Interventions    Readmission Risk Interventions No flowsheet data found.

## 2020-09-05 NOTE — Progress Notes (Addendum)
Inpatient Diabetes Program Recommendations  AACE/ADA: New Consensus Statement on Inpatient Glycemic Control (2015)  Target Ranges:  Prepandial:   less than 140 mg/dL      Peak postprandial:   less than 180 mg/dL (1-2 hours)      Critically ill patients:  140 - 180 mg/dL   Lab Results  Component Value Date   GLUCAP 177 (H) 09/05/2020   HGBA1C 11.4 (H) 09/02/2020    Review of Glycemic Control Results for Misty, [name] (MRN [id number]) as of 09/05/2020 15:15  Ref. Range 09/04/2020 16:42 09/04/2020 20:12 09/05/2020 06:29 09/05/2020 11:27  Glucose-Capillary Latest Ref Range: 70 - 99 mg/dL 196 (H) 236 (H) 167 (H) 177 (H)   Diabetes history:  DM2 Outpatient Diabetes medications:  Metformin 1000 mg BID Current orders for Inpatient glycemic control:  Lantus 18 units daily Novolog 0-9 units TID & 0-5 QHS Novolog 5 units TID  Note:   Spoke with patient and partner at bedside.  Educated patient on insulin pen use at home. Reviewed contents of insulin flexpen starter kit. Reviewed all steps of insulin pen including attachment of needle, 2-unit air shot, dialing up dose, giving injection, removing needle, disposal of sharps, storage of unused insulin, disposal of insulin etc. Patient able to provide successful return demonstration. Also reviewed troubleshooting with insulin pen. MD to give patient Rxs for insulin pens and insulin pen needles.  Educated on hypoglycemia, signs, symptoms and treatments.  She is reluctant about giving herself a shot.  Partner states he can help her at first.  Nursing staff, please allow patient to self administer insulins.  Will need glucometer at discharge. Asked her to check CBG's 4 times a day; AC & HS.  Needs help finding a PCP and will need close follow up.  Placed TOC consult for PCP and benefit check on Lantus and Novolog.     Glucometer and strips order # [id number]  Will continue to follow while inpatient.  Thank you, Reche Dixon, RN, BSN Diabetes  Coordinator Inpatient Diabetes Program [phone] (team pager from 8a-5p)

## 2020-09-05 NOTE — TOC Benefit Eligibility Note (Signed)
Transition of Care [facility]) Benefit Eligibility Note    Patient Details  Name: Misty [name] MRN: [id number] Date of Birth: [birth year]   Medication/Dose: LANTUS  20 UNITS  DAILY  Covered?: Yes  Tier: 3 Drug  Prescription Coverage [facility]  Spoke with Person/Company/Phone Number:: MIKE  @ [facility] RX # [phone]  Co-Pay: $100.00  Prior Approval: No  Deductible: Unmet (OUUT-OF-POCKET:UNMET)  Additional Notes: NOVOLOG  5 UNITS TID : CIVER- YES  CO-PAY- $ 100.00  TIER- 3 DRUG   P/A-NO    Mardene Sayer Phone Number: 09/05/2020, 4:24 PM

## 2020-09-05 NOTE — Progress Notes (Signed)
Approximately 1446, received call from CCMD for pt's HR =140s sustaining. Found pt walking to the bathroom. Pt asymptomatic. Denied pain. Walked pt back to bed. Pt's HR went down to 101. Will continue to monitor the pt.  Lawson Radar, RN

## 2020-09-05 NOTE — Progress Notes (Signed)
PROGRESS NOTE    Misty [name]  UXL:[id number] DOB: 14-May-1991 DOA: 09/02/2020 PCP: Norm Salt, PA    Brief Narrative:  30 y.o. female with medical history significant for type 2 diabetes mellitus, hypertension, and eczema, presenting to the emergency department for evaluation of elevated blood sugar and abscesses involving her scalp and low back.  Patient reports a history of recurrent skin infections and developed abscesses involving her right parietal scalp and low back couple weeks ago.  These have become more tender and enlarged over the past few days.  She denies any associated fevers.  She also notes that her blood glucose has been elevated over the past week, often in the 300s.  She takes Metformin only at home but checks her sugars.  She came to the emergency department yesterday for evaluation of elevated glucose, had a CBG of 571, but left prior to completing her evaluation.  Glucose has continued to be elevated at home.  She denies any chest pain, abdominal pain, nausea, vomiting, cough, or shortness of breath.  ED Course: Upon arrival to the ED, patient is found to be afebrile, saturating well on room air, tachycardic to 130, and with stable blood pressure.  Chemistry panel is notable for glucose of 530 with low serum bicarbonate and elevated anion gap.  Beta hydroxybutyrate is elevated to 2.27.  CBC is notable for a leukocytosis to 19,600 and a mild thrombocytosis.  Skin abscesses were incised and drained in the emergency department and the patient was given 1.5 L of LR, 1 g Rocephin, and started on insulin infusion.  COVID-19 PCR is negative  Assessment & Plan:   Principal Problem:   DKA (diabetic ketoacidosis) (HCC) Active Problems:   Abscess   Hypertension   1. DKA, type II DM  - Type II diabetic on metformin only with no recent A1c presents with elevated CBGs at home and is found to have serum glucose of 530 with low serum bicarb, elevated AG, and BHOB of 2.27  -  She was given IVF and started on insulin infusion in ED  - A1c of 11.4 noted -DKA physiology resolved with IVF hydration, now transitioned to subq insulin -cont to titrate insulin as needed -Repeat bmet in AM  2. Abscess, cellulitis with sepsis present on admission - Patient with hx of recurrent skin infections presents with abscesses involving low back and right parietal scalp with surrounding cellulitis  - Recent fever with continued leukocytosis and tachycardia prompted escalation to IV vanc on 1/2 -US soft tissue with findings worrisome for complex hypoechoic area within the L lower back, likely an area of developing phlegmon/abscess. No drainable fluid at this time -Patient continues to complain of pain to the area and nursing staff reports pain to the point of tears at times -Given poorly controlled diabetes and US findings suggestive of developing abscess, would continue IV abx for at least one more day  3. Hypertension  - BP stable, continue Norvasc a pt tolerates  4. Hyponatremia -Likely hypovolemic -Improved with NS at 75cc/hr -Repeat bmet in AM  DVT prophylaxis: Lovenox subq Code Status: Full Family Communication: Pt in room, family not at bedside  Status is: Inpatient  Remains inpatient appropriate because:IV treatments appropriate due to intensity of illness or inability to take PO and Inpatient level of care appropriate due to severity of illness   Dispo: The patient is from: Home              Anticipated d/c is to: Home  Anticipated d/c date is: 2 days              Patient currently is not medically stable to d/c.  Consultants:     Procedures:     Antimicrobials: Anti-infectives (From admission, onward)   Start     Dose/Rate Route Frequency Ordered Stop   09/04/20 1615  vancomycin (VANCOCIN) IVPB 1000 mg/200 mL premix  Status:  Discontinued        1,000 mg 200 mL/hr over 60 Minutes Intravenous  Once 09/04/20 1515 09/04/20 1527   09/04/20  1615  vancomycin (VANCOREADY) IVPB 1500 mg/300 mL        1,500 mg 150 mL/hr over 120 Minutes Intravenous Every 12 hours 09/04/20 1527     09/03/20 0100  sulfamethoxazole-trimethoprim (BACTRIM DS) 800-160 MG per tablet 2 tablet  Status:  Discontinued        2 tablet Oral Every 12 hours 09/03/20 0002 09/04/20 1515   09/02/20 1630  cefTRIAXone (ROCEPHIN) 1 g in sodium chloride 0.9 % 100 mL IVPB        1 g 200 mL/hr over 30 Minutes Intravenous  Once 09/02/20 1623 09/02/20 1808      Subjective: Eager to go home soon for her birthday  Objective: Vitals:   09/04/20 2010 09/04/20 2314 09/05/20 0420 09/05/20 1052  BP: 121/79 119/76 120/71 133/87  Pulse: 100   95  Resp: 14 17 13 18   Temp: 98.6 F (37 C) 99.2 F (37.3 C) 98.6 F (37 C) 98.2 F (36.8 C)  TempSrc: Oral Oral Oral Oral  SpO2: 98% 99% 100% 100%  Height:        Intake/Output Summary (Last 24 hours) at 09/05/2020 1409 Last data filed at 09/04/2020 2314 Gross per 24 hour  Intake 700 ml  Output 0 ml  Net 700 ml   There were no vitals filed for this visit.  Examination: General exam: Awake, laying in bed, in nad Respiratory system: Normal respiratory effort, no wheezing Cardiovascular system: regular rate, s1, s2 Gastrointestinal system: Soft, nondistended, positive BS Central nervous system: CN2-12 grossly intact, strength intact Extremities: Perfused, no clubbing Skin: Normal skin turgor, no notable skin lesions seen Psychiatry: Mood normal // no visual hallucinations   Data Reviewed: I have personally reviewed following labs and imaging studies  CBC: Recent Labs  Lab 09/01/20 1559 09/02/20 1301 09/02/20 1737 09/03/20 0302 09/04/20 0241 09/05/20 0123  WBC 20.3* 19.6*  --  19.5* 14.8* 9.7  NEUTROABS 18.6* 18.0*  --   --   --   --   HGB 13.1 12.6 13.9 11.6* 11.3* 10.7*  HCT 37.4 38.7 41.0 34.1* 31.8* 31.5*  MCV 90.3 93.9  --  89.7 88.8 88.7  PLT 417* 408*  --  393 389 395   Basic Metabolic Panel: Recent  Labs  Lab 09/03/20 0302 09/03/20 0847 09/03/20 1233 09/04/20 0241 09/05/20 0123  NA 131* 131* 130* 128* 132*  K 3.3* 3.5 3.1* 3.4* 3.0*  CL 98 99 98 98 100  CO2 21* 20* 21* 20* 22  GLUCOSE 229* 213* 272* 263* 170*  BUN <5* <5* <5* <5* <5*  CREATININE 0.52 0.60 0.57 0.58 0.51  CALCIUM 8.7* 8.7* 8.4* 8.2* 8.3*   GFR: Estimated Creatinine Clearance: 106.1 mL/min (by C-G formula based on SCr of 0.51 mg/dL). Liver Function Tests: Recent Labs  Lab 09/01/20 1559  AST 14*  ALT 16  ALKPHOS 134*  BILITOT 0.4  PROT 7.2  ALBUMIN 3.3*   No results for input(s): LIPASE,  AMYLASE in the last 168 hours. No results for input(s): AMMONIA in the last 168 hours. Coagulation Profile: No results for input(s): INR, PROTIME in the last 168 hours. Cardiac Enzymes: No results for input(s): CKTOTAL, CKMB, CKMBINDEX, TROPONINI in the last 168 hours. BNP (last 3 results) No results for input(s): PROBNP in the last 8760 hours. HbA1C: Recent Labs    09/02/20 2225  HGBA1C 11.4*   CBG: Recent Labs  Lab 09/04/20 1124 09/04/20 1642 09/04/20 2012 09/05/20 0629 09/05/20 1127  GLUCAP 270* 196* 236* 167* 177*   Lipid Profile: No results for input(s): CHOL, HDL, LDLCALC, TRIG, CHOLHDL, LDLDIRECT in the last 72 hours. Thyroid Function Tests: No results for input(s): TSH, T4TOTAL, FREET4, T3FREE, THYROIDAB in the last 72 hours. Anemia Panel: No results for input(s): VITAMINB12, FOLATE, FERRITIN, TIBC, IRON, RETICCTPCT in the last 72 hours. Sepsis Labs: Recent Labs  Lab 09/01/20 1559  LATICACIDVEN 1.5    Recent Results (from the past 240 hour(s))  Resp Panel by RT-PCR (Flu A&B, Covid) Nasopharyngeal Swab     Status: None   Collection Time: 09/02/20  5:09 PM   Specimen: Nasopharyngeal Swab; Nasopharyngeal(NP) swabs in vial transport medium  Result Value Ref Range Status   SARS Coronavirus 2 by RT PCR NEGATIVE NEGATIVE Final    Comment: (NOTE) SARS-CoV-2 target nucleic acids are NOT  DETECTED.  The SARS-CoV-2 RNA is generally detectable in upper respiratory specimens during the acute phase of infection. The lowest concentration of SARS-CoV-2 viral copies this assay can detect is 138 copies/mL. A negative result does not preclude SARS-Cov-2 infection and should not be used as the sole basis for treatment or other patient management decisions. A negative result may occur with  improper specimen collection/handling, submission of specimen other than nasopharyngeal swab, presence of viral mutation(s) within the areas targeted by this assay, and inadequate number of viral copies(<138 copies/mL). A negative result must be combined with clinical observations, patient history, and epidemiological information. The expected result is Negative.  Fact Sheet for Patients:  BloggerCourse.com  Fact Sheet for Healthcare Providers:  SeriousBroker.it  This test is no t yet approved or cleared by the [place] FDA and  has been authorized for detection and/or diagnosis of SARS-CoV-2 by FDA under an Emergency Use Authorization (EUA). This EUA will remain  in effect (meaning this test can be used) for the duration of the COVID-19 declaration under Section 564(b)(1) of the Act, 21 U.S.C.section 360bbb-3(b)(1), unless the authorization is terminated  or revoked sooner.       Influenza A by PCR NEGATIVE NEGATIVE Final   Influenza B by PCR NEGATIVE NEGATIVE Final    Comment: (NOTE) The Xpert Xpress SARS-CoV-2/FLU/RSV plus assay is intended as an aid in the diagnosis of influenza from Nasopharyngeal swab specimens and should not be used as a sole basis for treatment. Nasal washings and aspirates are unacceptable for Xpert Xpress SARS-CoV-2/FLU/RSV testing.  Fact Sheet for Patients: BloggerCourse.com  Fact Sheet for Healthcare Providers: SeriousBroker.it  This test is not yet  approved or cleared by the [place] FDA and has been authorized for detection and/or diagnosis of SARS-CoV-2 by FDA under an Emergency Use Authorization (EUA). This EUA will remain in effect (meaning this test can be used) for the duration of the COVID-19 declaration under Section 564(b)(1) of the Act, 21 U.S.C. section 360bbb-3(b)(1), unless the authorization is terminated or revoked.  Performed at [facility], [address], [place], [place] [address]      Radiology Studies:  US Abdomen Limited  Result Date: 09/03/2020 CLINICAL DATA:  Left lower back area concerning for abscess. Recent I and D EXAM: ULTRASOUND ABDOMEN LIMITED COMPARISON:  CT 02/25/2020 FINDINGS: There is a hypoechoic area noted within the left lower back in the area of concern measuring 3.5 x 2.9 x 2.0 cm. Mixed echogenicity centrally. There may be a small amount of fluid, but this appears to represent more of an area of phlegmonous change rather than well-defined drainable abscess. IMPRESSION: Complex hypoechoic area within the left lower back, likely area of developing phlegmon/abscess without well-defined abscess or drainable fluid at this time. Electronically Signed   By: Rolm Baptise M.D.   On: 09/03/2020 16:48    Scheduled Meds: . amLODipine  5 mg Oral Daily  . enoxaparin (LOVENOX) injection  40 mg Subcutaneous Q24H  . feeding supplement (GLUCERNA SHAKE)  237 mL Oral TID BM  . insulin aspart  0-5 Units Subcutaneous QHS  . insulin aspart  0-9 Units Subcutaneous TID WC  . insulin aspart  5 Units Subcutaneous TID WC  . insulin glargine  18 Units Subcutaneous Daily  . metoCLOPramide  5 mg Oral TID AC  . potassium chloride  40 mEq Oral BID   Continuous Infusions: . sodium chloride 75 mL/hr at 09/05/20 0630  . dextrose 5% lactated ringers Stopped (09/03/20 0810)  . insulin Stopped (09/03/20 0810)  . vancomycin 1,500 mg (09/05/20 0354)     LOS: 3 days   Marylu Lund, MD [facility] Pager On  Amion  If 7PM-7AM, please contact night-coverage 09/05/2020, 2:09 PM

## 2020-09-06 ENCOUNTER — Other Ambulatory Visit (HOSPITAL_COMMUNITY): Payer: Self-pay | Admitting: Internal Medicine

## 2020-09-06 LAB — BASIC METABOLIC PANEL
Anion gap: 7 (ref 5–15)
BUN: <5 mg/dL — ABNORMAL LOW (ref 6–20)
CO2: 24 mmol/L (ref 22–32)
Calcium: 8.5 mg/dL — ABNORMAL LOW (ref 8.9–10.3)
Chloride: 102 mmol/L (ref 98–111)
Creatinine, Ser: 0.44 mg/dL (ref 0.44–1.00)
GFR, Estimated: >60 mL/min (ref >60)
Glucose, Bld: 159 mg/dL — ABNORMAL HIGH (ref 70–99)
Potassium: 3.9 mmol/L (ref 3.5–5.1)
Sodium: 133 mmol/L — ABNORMAL LOW (ref 135–145)

## 2020-09-06 LAB — GLUCOSE, CAPILLARY
Glucose-Capillary: 159 mg/dL — ABNORMAL HIGH (ref 70–99)
Glucose-Capillary: 175 mg/dL — ABNORMAL HIGH (ref 70–99)

## 2020-09-06 MED ORDER — OXYCODONE-ACETAMINOPHEN 5-325 MG PO TABS: 1.0000 | ORAL_TABLET | ORAL | 0 refills | Status: DC | PRN

## 2020-09-06 MED ORDER — METOCLOPRAMIDE HCL 5 MG PO TABS: 5.0000 mg | ORAL_TABLET | Freq: Three times a day (TID) | ORAL | 0 refills | Status: DC

## 2020-09-06 MED ORDER — INSULIN ASPART 100 UNIT/ML FLEXPEN: 8.0000 [IU] | PEN_INJECTOR | Freq: Three times a day (TID) | SUBCUTANEOUS | 0 refills | Status: DC

## 2020-09-06 MED ORDER — INSULIN DETEMIR 100 UNIT/ML FLEXPEN: 18.0000 [IU] | PEN_INJECTOR | Freq: Every day | SUBCUTANEOUS | 0 refills | Status: DC

## 2020-09-06 MED ORDER — INSULIN GLARGINE 100 UNIT/ML SOLOSTAR PEN: 18.0000 [IU] | PEN_INJECTOR | Freq: Every day | SUBCUTANEOUS | 0 refills | Status: DC

## 2020-09-06 MED ORDER — SULFAMETHOXAZOLE-TRIMETHOPRIM 800-160 MG PO TABS: 1.0000 | ORAL_TABLET | Freq: Two times a day (BID) | ORAL | 0 refills | Status: DC

## 2020-09-06 MED ORDER — BLOOD GLUCOSE METER KIT: PACK | 0 refills | Status: DC

## 2020-09-06 MED ORDER — INSULIN ASPART (W/NIACINAMIDE) 100 UNIT/ML ~~LOC~~ SOPN: 8.0000 [IU] | PEN_INJECTOR | Freq: Three times a day (TID) | SUBCUTANEOUS | 0 refills | Status: DC

## 2020-09-06 MED ORDER — INSULIN PEN NEEDLE 31G X 5 MM MISC: 1.0000 | Freq: Four times a day (QID) | 0 refills | Status: DC

## 2020-09-06 MED FILL — METOCLOPRAMIDE 5 MG TABLET: 5 | 30 days supply | Qty: 90 | Fill #0

## 2020-09-06 MED FILL — OXYCODONE-APAP 5-325MG: 5-325 | 1 days supply | Qty: 6 | Fill #0

## 2020-09-06 MED FILL — SULFAMETHOXAZOLE-TMP DS TAB: 800-160 | 4 days supply | Qty: 8 | Fill #0

## 2020-09-06 MED FILL — NOVOLOG FLEXPEN SYRINGE: 100 | 30 days supply | Qty: 9 | Fill #0

## 2020-09-06 MED FILL — PENTIPS 32G X 4 MM MISC: 32G X 4 MM | 25 days supply | Qty: 100 | Fill #0

## 2020-09-06 MED FILL — TRUE METRIX GLUCOSE TEST ST: 25 days supply | Qty: 100 | Fill #0

## 2020-09-06 MED FILL — TRUE METRIX BLOOD GLUCOSE M: W/DEVICE | 1 days supply | Qty: 1 | Fill #0

## 2020-09-06 MED FILL — TRUEplus LANCETS 28G MISC: 25 days supply | Qty: 100 | Fill #0

## 2020-09-06 MED FILL — LEVEMIR FLEXTOUCH 100 UNITS: 100 | 30 days supply | Qty: 3 | Fill #0

## 2020-09-06 NOTE — Discharge Summary (Signed)
Physician Discharge Summary  Misty [name] NFA:[id number] DOB: 1991/01/09 DOA: 09/02/2020  PCP: Trey Sailors, PA  Admit date: 09/02/2020 Discharge date: 09/06/2020  Admitted From: Home Disposition:  Home  Recommendations for Outpatient Follow-up:  1. Follow up with PCP in 1-2 weeks  Discharge Condition:Improved CODE STATUS:Full Diet recommendation: Diabetic   Brief/Interim Summary: 30 y.o.femalewith medical history significant fortype 2 diabetes mellitus, hypertension, and eczema, presenting to the emergency department for evaluation of elevated blood sugar and abscesses involving her scalp and low back. Patient reports a history of recurrent skin infections and developed abscesses involving her right parietal scalp and low back couple weeks ago. These have become more tender and enlarged over the past few days. She denies any associated fevers. She also notes that her blood glucose has been elevated over the past week, often in the 300s. She takes Metformin only at home but checks her sugars. She came to the emergency department yesterday for evaluation of elevated glucose, had a CBG of 571, but left prior tocompleting her evaluation. Glucose has continued to be elevated at home. She denies any chest pain, abdominal pain, nausea, vomiting, cough, or shortness of breath.  ED Course:Upon arrival to the ED, patient is found to be afebrile, saturating well on room air, tachycardic to 130, and with stable blood pressure. Chemistry panel is notable for glucose of 530 with low serum bicarbonate and elevated anion gap. Beta hydroxybutyrate is elevated to 2.27. CBC is notable for a leukocytosis to 19,600 and a mild thrombocytosis. Skin abscesses were incised and drained in the emergency department and the patient was given 1.5 L of LR, 1 g Rocephin, and started on insulin infusion. COVID-19 PCR is negative  Discharge Diagnoses:  Principal Problem:   DKA (diabetic  ketoacidosis) ([place]) Active Problems:   Abscess   Hypertension  1.DKA, type II DM -Type II diabetic on metformin only with no recent A1c presents with elevated CBGs at home and is found to have serum glucose of 530 with low serum bicarb, elevated AG, and BHOB of 2.27 -She was given IVF and started on insulin infusion in ED -A1c of 11.4 noted -DKA physiology resolved with IVF hydration, now transitioned to subq insulin  2.Abscess, cellulitiswith sepsis present on admission -Patient with hx of recurrent skin infections presents with abscesses involving low back and right parietal scalp with surrounding cellulitis -Recent fever with continued leukocytosis and tachycardia prompted escalation to IV vanc on 1/2 -US soft tissue with findings worrisome for complex hypoechoic area within the L lower back, likely an area of developing phlegmon/abscess. No drainable fluid at this time -Patient was continued on IV vanc with normalization of WBC. Pt reports areas of edema are improving -NCCSR reviewed. No controlled substances are listed. Will provide very limited quantity of narcotic for pain relief -Complete 4 more days of bactrim DS to complete tx  3.Hypertension -BP stable, continue Norvascas pt tolerates  4. Hyponatremia -Likely hypovolemic -Improved with IVF hydration  Discharge Instructions   Allergies as of 09/06/2020      Reactions   Iodides Anaphylaxis, Itching   Morphine And Related Anaphylaxis, Itching   Shellfish Allergy Anaphylaxis, Itching   Pt reports "watery eyes"   Ultram [tramadol Hcl] Hives   Hives and swollen lips       Medication List    STOP taking these medications   metFORMIN 500 MG tablet Commonly known as: GLUCOPHAGE     TAKE these medications   amLODipine 5 MG tablet Commonly known as:  NORVASC Take 1 tablet (5 mg total) by mouth daily.   blood glucose meter kit and supplies Dispense based on patient and insurance preference. Use up  to four times daily as directed. (FOR ICD-10 E10.9, E11.9).   blood glucose meter kit and supplies Kit Dispense based on patient and insurance preference. Use up to four times daily as directed. (FOR ICD-9 250.00, 250.01).   insulin aspart 100 UNIT/ML FlexPen Commonly known as: NOVOLOG Inject 8 Units into the skin 3 (three) times daily with meals.   insulin detemir 100 UNIT/ML FlexPen Commonly known as: LEVEMIR Inject 18 Units into the skin daily.   Insulin Pen Needle 31G X 5 MM Misc 1 Device by Does not apply route 4 (four) times daily. For use with insulin pens   metoCLOPramide 5 MG tablet Commonly known as: REGLAN Take 1 tablet (5 mg total) by mouth 3 (three) times daily before meals.   oxyCODONE-acetaminophen 5-325 MG tablet Commonly known as: PERCOCET/ROXICET Take 1 tablet by mouth every 4 (four) hours as needed for moderate pain.   sulfamethoxazole-trimethoprim 800-160 MG tablet Commonly known as: BACTRIM DS Take 1 tablet by mouth 2 (two) times daily for 4 days.       Follow-up Information    [place]. Go on 09/28/2020.   Why: appointment at 9:50 for f/u and to establish care- please arrive 15 min prior to appointment and bring list of meds/id/insurance cards Contact information: [place] [phone]             Allergies  Allergen Reactions  . Iodides Anaphylaxis and Itching  . Morphine And Related Anaphylaxis and Itching  . Shellfish Allergy Anaphylaxis and Itching    Pt reports "watery eyes"  . Ultram [Tramadol Hcl] Hives    Hives and swollen lips     Procedures/Studies: US Abdomen Limited  Result Date: 09/03/2020 CLINICAL DATA:  Left lower back area concerning for abscess. Recent I and D EXAM: ULTRASOUND ABDOMEN LIMITED COMPARISON:  CT 02/25/2020 FINDINGS: There is a hypoechoic area noted within the left lower back in the area of concern measuring 3.5 x 2.9 x 2.0  cm. Mixed echogenicity centrally. There may be a small amount of fluid, but this appears to represent more of an area of phlegmonous change rather than well-defined drainable abscess. IMPRESSION: Complex hypoechoic area within the left lower back, likely area of developing phlegmon/abscess without well-defined abscess or drainable fluid at this time. Electronically Signed   By: Rolm Baptise M.D.   On: 09/03/2020 16:48    Subjective: Very eager to go home  Discharge Exam: Vitals:   09/06/20 0745 09/06/20 1214  BP: 127/83 110/77  Pulse: 72   Resp: 20 18  Temp: 99 F (37.2 C) 98 F (36.7 C)  SpO2: 99% 100%   Vitals:   09/05/20 2334 09/06/20 0357 09/06/20 0745 09/06/20 1214  BP: 122/82 129/90 127/83 110/77  Pulse: 81 87 72   Resp:  $Remo'14 20 18  'YCRra$ Temp: 99.3 F (37.4 C) 98.8 F (37.1 C) 99 F (37.2 C) 98 F (36.7 C)  TempSrc: Oral Oral Oral Oral  SpO2: 100% 98% 99% 100%  Height:        General: Pt is alert, awake, not in acute distress Cardiovascular: RRR, S1/S2 +, no rubs, no gallops Respiratory: CTA bilaterally, no wheezing, no rhonchi Abdominal: Soft, NT, ND, bowel sounds + Extremities: no edema, no cyanosis   The results of significant diagnostics from  this hospitalization (including imaging, microbiology, ancillary and laboratory) are listed below for reference.     Microbiology: Recent Results (from the past 240 hour(s))  Resp Panel by RT-PCR (Flu A&B, Covid) Nasopharyngeal Swab     Status: None   Collection Time: 09/02/20  5:09 PM   Specimen: Nasopharyngeal Swab; Nasopharyngeal(NP) swabs in vial transport medium  Result Value Ref Range Status   SARS Coronavirus 2 by RT PCR NEGATIVE NEGATIVE Final    Comment: (NOTE) SARS-CoV-2 target nucleic acids are NOT DETECTED.  The SARS-CoV-2 RNA is generally detectable in upper respiratory specimens during the acute phase of infection. The lowest concentration of SARS-CoV-2 viral copies this assay can detect is 138 copies/mL.  A negative result does not preclude SARS-Cov-2 infection and should not be used as the sole basis for treatment or other patient management decisions. A negative result may occur with  improper specimen collection/handling, submission of specimen other than nasopharyngeal swab, presence of viral mutation(s) within the areas targeted by this assay, and inadequate number of viral copies(<138 copies/mL). A negative result must be combined with clinical observations, patient history, and epidemiological information. The expected result is Negative.  Fact Sheet for Patients:  EntrepreneurPulse.com.au  Fact Sheet for Healthcare Providers:  IncredibleEmployment.be  This test is no t yet approved or cleared by the [place] FDA and  has been authorized for detection and/or diagnosis of SARS-CoV-2 by FDA under an Emergency Use Authorization (EUA). This EUA will remain  in effect (meaning this test can be used) for the duration of the COVID-19 declaration under Section 564(b)(1) of the Act, 21 U.S.C.section 360bbb-3(b)(1), unless the authorization is terminated  or revoked sooner.       Influenza A by PCR NEGATIVE NEGATIVE Final   Influenza B by PCR NEGATIVE NEGATIVE Final    Comment: (NOTE) The Xpert Xpress SARS-CoV-2/FLU/RSV plus assay is intended as an aid in the diagnosis of influenza from Nasopharyngeal swab specimens and should not be used as a sole basis for treatment. Nasal washings and aspirates are unacceptable for Xpert Xpress SARS-CoV-2/FLU/RSV testing.  Fact Sheet for Patients: EntrepreneurPulse.com.au  Fact Sheet for Healthcare Providers: IncredibleEmployment.be  This test is not yet approved or cleared by the [place] FDA and has been authorized for detection and/or diagnosis of SARS-CoV-2 by FDA under an Emergency Use Authorization (EUA). This EUA will remain in effect (meaning this test  can be used) for the duration of the COVID-19 declaration under Section 564(b)(1) of the Act, 21 U.S.C. section 360bbb-3(b)(1), unless the authorization is terminated or revoked.  Performed at [facility], [place] [address] [place], [place] [address]      Labs: BNP (last 3 results) No results for input(s): BNP in the last 8760 hours. Basic Metabolic Panel: Recent Labs  Lab 09/03/20 0847 09/03/20 1233 09/04/20 0241 09/05/20 0123 09/06/20 0200  NA 131* 130* 128* 132* 133*  K 3.5 3.1* 3.4* 3.0* 3.9  CL 99 98 98 100 102  CO2 20* 21* 20* 22 24  GLUCOSE 213* 272* 263* 170* 159*  BUN <5* <5* <5* <5* <5*  CREATININE 0.60 0.57 0.58 0.51 0.44  CALCIUM 8.7* 8.4* 8.2* 8.3* 8.5*   Liver Function Tests: Recent Labs  Lab 09/01/20 1559  AST 14*  ALT 16  ALKPHOS 134*  BILITOT 0.4  PROT 7.2  ALBUMIN 3.3*   No results for input(s): LIPASE, AMYLASE in the last 168 hours. No results for input(s): AMMONIA in the last 168 hours. CBC: Recent Labs  Lab 09/01/20 1559 09/02/20 1301 09/02/20 1737 09/03/20 0302 09/04/20 0241 09/05/20 0123  WBC 20.3* 19.6*  --  19.5* 14.8* 9.7  NEUTROABS 18.6* 18.0*  --   --   --   --   HGB 13.1 12.6 13.9 11.6* 11.3* 10.7*  HCT 37.4 38.7 41.0 34.1* 31.8* 31.5*  MCV 90.3 93.9  --  89.7 88.8 88.7  PLT 417* 408*  --  393 389 395   Cardiac Enzymes: No results for input(s): CKTOTAL, CKMB, CKMBINDEX, TROPONINI in the last 168 hours. BNP: Invalid input(s): POCBNP CBG: Recent Labs  Lab 09/05/20 1127 09/05/20 1616 09/05/20 2131 09/06/20 0559 09/06/20 1210  GLUCAP 177* 266* 173* 159* 175*   D-Dimer No results for input(s): DDIMER in the last 72 hours. Hgb A1c No results for input(s): HGBA1C in the last 72 hours. Lipid Profile No results for input(s): CHOL, HDL, LDLCALC, TRIG, CHOLHDL, LDLDIRECT in the last 72 hours. Thyroid function studies No results for input(s): TSH, T4TOTAL, T3FREE, THYROIDAB in the last 72 hours.  Invalid input(s):  FREET3 Anemia work up No results for input(s): VITAMINB12, FOLATE, FERRITIN, TIBC, IRON, RETICCTPCT in the last 72 hours. Urinalysis    Component Value Date/Time   COLORURINE STRAW (A) 09/02/2020 2143   APPEARANCEUR CLEAR 09/02/2020 2143   LABSPEC 1.032 (H) 09/02/2020 2143   PHURINE 5.0 09/02/2020 2143   GLUCOSEU >=500 (A) 09/02/2020 2143   HGBUR SMALL (A) 09/02/2020 2143   BILIRUBINUR NEGATIVE 09/02/2020 2143   BILIRUBINUR neg 11/30/2013 1613   KETONESUR 80 (A) 09/02/2020 2143   PROTEINUR NEGATIVE 09/02/2020 2143   UROBILINOGEN 0.2 10/05/2017 1432   NITRITE NEGATIVE 09/02/2020 2143   LEUKOCYTESUR NEGATIVE 09/02/2020 2143   Sepsis Labs Invalid input(s): PROCALCITONIN,  WBC,  LACTICIDVEN Microbiology Recent Results (from the past 240 hour(s))  Resp Panel by RT-PCR (Flu A&B, Covid) Nasopharyngeal Swab     Status: None   Collection Time: 09/02/20  5:09 PM   Specimen: Nasopharyngeal Swab; Nasopharyngeal(NP) swabs in vial transport medium  Result Value Ref Range Status   SARS Coronavirus 2 by RT PCR NEGATIVE NEGATIVE Final    Comment: (NOTE) SARS-CoV-2 target nucleic acids are NOT DETECTED.  The SARS-CoV-2 RNA is generally detectable in upper respiratory specimens during the acute phase of infection. The lowest concentration of SARS-CoV-2 viral copies this assay can detect is 138 copies/mL. A negative result does not preclude SARS-Cov-2 infection and should not be used as the sole basis for treatment or other patient management decisions. A negative result may occur with  improper specimen collection/handling, submission of specimen other than nasopharyngeal swab, presence of viral mutation(s) within the areas targeted by this assay, and inadequate number of viral copies(<138 copies/mL). A negative result must be combined with clinical observations, patient history, and epidemiological information. The expected result is Negative.  Fact Sheet for Patients:   EntrepreneurPulse.com.au  Fact Sheet for Healthcare Providers:  IncredibleEmployment.be  This test is no t yet approved or cleared by the [place] FDA and  has been authorized for detection and/or diagnosis of SARS-CoV-2 by FDA under an Emergency Use Authorization (EUA). This EUA will remain  in effect (meaning this test can be used) for the duration of the COVID-19 declaration under Section 564(b)(1) of the Act, 21 U.S.C.section 360bbb-3(b)(1), unless the authorization is terminated  or revoked sooner.       Influenza A by PCR NEGATIVE NEGATIVE Final   Influenza B by PCR NEGATIVE NEGATIVE Final    Comment: (NOTE) The Xpert Xpress SARS-CoV-2/FLU/RSV  plus assay is intended as an aid in the diagnosis of influenza from Nasopharyngeal swab specimens and should not be used as a sole basis for treatment. Nasal washings and aspirates are unacceptable for Xpert Xpress SARS-CoV-2/FLU/RSV testing.  Fact Sheet for Patients: EntrepreneurPulse.com.au  Fact Sheet for Healthcare Providers: IncredibleEmployment.be  This test is not yet approved or cleared by the [place] FDA and has been authorized for detection and/or diagnosis of SARS-CoV-2 by FDA under an Emergency Use Authorization (EUA). This EUA will remain in effect (meaning this test can be used) for the duration of the COVID-19 declaration under Section 564(b)(1) of the Act, 21 U.S.C. section 360bbb-3(b)(1), unless the authorization is terminated or revoked.  Performed at [facility], [place] [address] [place], [place] [address]    Time spent:89min  SIGNED:   Marylu Lund, MD  [facility] 09/06/2020, 5:32 PM  If 7PM-7AM, please contact night-coverage

## 2020-09-06 NOTE — TOC Transition Note (Signed)
Transition of Care (TOC) - CM/SW Discharge Note Donn Pierini RN, BSN Transitions of Care Unit 4E- RN Case Manager See Treatment Team for direct phone #    Patient Details  Name: Misty [name] MRN: [id number] Date of Birth: 1991/01/23  Transition [facility]) CM/SW Contact:  Darrold Span, RN Phone Number: 09/06/2020, 2:46 PM   Clinical Narrative:    Pt stable for transition home today, per benefits check- insulin cost $100- spoke with DM educator and will have scripts sent to [facility] pharmacy to check copay on some other insulins which hopefully will be less- Per Traci with TOC- copay for Levimer is zero dollar as well as Novolog. TOC pharmacy to fill all meds and deliver to bedside prior to discharge.   CM spoke with pt at bedside and informed of f/u appointment with [facility] as well as medications that will be filled by the [facility] pharmacy- pt voiced understanding.     Final next level of care: Home/Self Care Barriers to Discharge: Barriers Resolved   Patient Goals and CMS Choice Patient states their goals for this hospitalization and ongoing recovery are:: return home   Choice offered to / list presented to : NA  Discharge Placement               Home        Discharge Plan and Services   Discharge Planning Services: CM Consult,Indigent Health Clinic,Follow-up appt scheduled,Medication Assistance Post Acute Care Choice: NA          DME Arranged: N/A DME Agency: NA       HH Arranged: NA HH Agency: NA        Social Determinants of Health (SDOH) Interventions     Readmission Risk Interventions Readmission Risk Prevention Plan 09/06/2020  Transportation Screening Complete  PCP or Specialist Appt within 5-7 Days (No Data)  Home Care Screening Complete  Medication Review (RN CM) Complete  Some recent data might be hidden

## 2020-09-06 NOTE — Progress Notes (Signed)
Inpatient Diabetes Program Recommendations  AACE/ADA: New Consensus Statement on Inpatient Glycemic Control (2015)  Target Ranges:  Prepandial:   less than 140 mg/dL      Peak postprandial:   less than 180 mg/dL (1-2 hours)      Critically ill patients:  140 - 180 mg/dL   Lab Results  Component Value Date   GLUCAP 175 (H) 09/06/2020   HGBA1C 11.4 (H) 09/02/2020    Note:  Spoke with TOC  Pharmacy.  They received prescriptions from MD; Levemir and Novolog will be $0 co-pay.  Followed up with patient on diabetes and insulin teaching.  She states she does not have any further questions.     Thank you, Dulce Sellar, RN, BSN Diabetes Coordinator Inpatient Diabetes Program [phone] (team pager from 8a-5p)

## 2020-09-06 NOTE — Progress Notes (Signed)
Discharge instructions provided to patient. All medications, follow up appointments, and discharge instructions discussed. IV out. Monitor off. CCMD notified. Discharging to home with mom.  Sabra Heck, RN

## 2020-09-17 ENCOUNTER — Emergency Department (HOSPITAL_COMMUNITY)
Admission: EM | Admit: 2020-09-17 | Discharge: 2020-09-18 | Disposition: A | Discharge status: Home / Self Care | Payer: 59 | Attending: Emergency Medicine | Admitting: Emergency Medicine

## 2020-09-17 ENCOUNTER — Other Ambulatory Visit: Payer: Self-pay

## 2020-09-17 DIAGNOSIS — E1165 Type 2 diabetes mellitus with hyperglycemia: Secondary | ICD-10-CM | POA: Insufficient documentation

## 2020-09-17 DIAGNOSIS — Z794 Long term (current) use of insulin: Secondary | ICD-10-CM | POA: Diagnosis not present

## 2020-09-17 DIAGNOSIS — I10 Essential (primary) hypertension: Secondary | ICD-10-CM | POA: Diagnosis not present

## 2020-09-17 DIAGNOSIS — L03818 Cellulitis of other sites: Secondary | ICD-10-CM

## 2020-09-17 DIAGNOSIS — R739 Hyperglycemia, unspecified: Secondary | ICD-10-CM

## 2020-09-17 DIAGNOSIS — L02212 Cutaneous abscess of back [any part, except buttock]: Secondary | ICD-10-CM | POA: Diagnosis present

## 2020-09-17 DIAGNOSIS — Z7951 Long term (current) use of inhaled steroids: Secondary | ICD-10-CM | POA: Diagnosis not present

## 2020-09-17 DIAGNOSIS — Z79899 Other long term (current) drug therapy: Secondary | ICD-10-CM | POA: Insufficient documentation

## 2020-09-17 DIAGNOSIS — Z87891 Personal history of nicotine dependence: Secondary | ICD-10-CM | POA: Insufficient documentation

## 2020-09-17 DIAGNOSIS — J45909 Unspecified asthma, uncomplicated: Secondary | ICD-10-CM | POA: Diagnosis not present

## 2020-09-17 DIAGNOSIS — L03312 Cellulitis of back [any part except buttock]: Secondary | ICD-10-CM | POA: Diagnosis not present

## 2020-09-17 LAB — CBC
HCT: 35.1 % — ABNORMAL LOW (ref 36.0–46.0)
Hemoglobin: 11.4 g/dL — ABNORMAL LOW (ref 12.0–15.0)
MCH: 30.6 pg (ref 26.0–34.0)
MCHC: 32.5 g/dL (ref 30.0–36.0)
MCV: 94.4 fL (ref 80.0–100.0)
Platelets: 457 10*3/uL — ABNORMAL HIGH (ref 150–400)
RBC: 3.72 MIL/uL — ABNORMAL LOW (ref 3.87–5.11)
RDW: 12.7 % (ref 11.5–15.5)
WBC: 7.2 10*3/uL (ref 4.0–10.5)
nRBC: 0 % (ref 0.0–0.2)

## 2020-09-17 LAB — BASIC METABOLIC PANEL
Anion gap: 12 (ref 5–15)
BUN: 7 mg/dL (ref 6–20)
CO2: 22 mmol/L (ref 22–32)
Calcium: 9.5 mg/dL (ref 8.9–10.3)
Chloride: 96 mmol/L — ABNORMAL LOW (ref 98–111)
Creatinine, Ser: 0.74 mg/dL (ref 0.44–1.00)
GFR, Estimated: >60 mL/min (ref >60)
Glucose, Bld: 491 mg/dL — ABNORMAL HIGH (ref 70–99)
Potassium: 4.2 mmol/L (ref 3.5–5.1)
Sodium: 130 mmol/L — ABNORMAL LOW (ref 135–145)

## 2020-09-17 LAB — I-STAT BETA HCG BLOOD, ED (MC, WL, AP ONLY): I-stat hCG, quantitative: <5 m[IU]/mL (ref <5)

## 2020-09-17 LAB — CBG MONITORING, ED: Glucose-Capillary: 474 mg/dL — ABNORMAL HIGH (ref 70–99)

## 2020-09-17 MED ORDER — IBUPROFEN 400 MG PO TABS
600.0000 mg | ORAL_TABLET | Freq: Once | ORAL | Status: AC
  Administered 2020-09-17: 23:00:00 600 mg via ORAL
  Filled 2020-09-17: qty 1

## 2020-09-17 NOTE — ED Triage Notes (Addendum)
Pt presents to ED BIB [facility]. Pt c/o abscess on back. Pt seen here for same 2w ago. Pt reports that her CBG was 460 at home and self administered her home insulin. Pt febrile w/ EMS (101.1) given 1000mg  tylenol. Pain 8/10. Pt reports taking full dose of antibiotics.

## 2020-09-18 LAB — URINALYSIS, ROUTINE W REFLEX MICROSCOPIC
Bacteria, UA: NONE SEEN
Bilirubin Urine: NEGATIVE
Glucose, UA: >=500 mg/dL — AB
Hgb urine dipstick: NEGATIVE
Ketones, ur: NEGATIVE mg/dL
Leukocytes,Ua: NEGATIVE
Nitrite: NEGATIVE
Protein, ur: NEGATIVE mg/dL
Specific Gravity, Urine: 1.037 — ABNORMAL HIGH (ref 1.005–1.030)
pH: 5 (ref 5.0–8.0)

## 2020-09-18 LAB — CBG MONITORING, ED
Glucose-Capillary: 314 mg/dL — ABNORMAL HIGH (ref 70–99)
Glucose-Capillary: 273 mg/dL — ABNORMAL HIGH (ref 70–99)

## 2020-09-18 MED ORDER — AMLODIPINE BESYLATE 5 MG PO TABS: 5.0000 mg | ORAL_TABLET | Freq: Every day | ORAL | 0 refills | Status: DC

## 2020-09-18 MED ORDER — DOXYCYCLINE HYCLATE 100 MG PO CAPS: 100.0000 mg | ORAL_CAPSULE | Freq: Two times a day (BID) | ORAL | 0 refills | Status: DC

## 2020-09-18 MED ORDER — HYDROCODONE-ACETAMINOPHEN 5-325 MG PO TABS
1.0000 | ORAL_TABLET | Freq: Once | ORAL | Status: AC
  Administered 2020-09-18: 1 via ORAL
  Filled 2020-09-18: qty 1

## 2020-09-18 MED ORDER — INSULIN ASPART 100 UNIT/ML ~~LOC~~ SOLN
10.0000 [IU] | Freq: Once | SUBCUTANEOUS | Status: AC
  Administered 2020-09-18: 10 [IU] via SUBCUTANEOUS

## 2020-09-18 MED ORDER — HYDROCODONE-ACETAMINOPHEN 5-325 MG PO TABS
2.0000 | ORAL_TABLET | ORAL | 0 refills | Status: DC | PRN
Start: 2020-09-18 — End: 2020-11-30

## 2020-09-18 MED ORDER — SODIUM CHLORIDE 0.9 % IV BOLUS
1000.0000 mL | Freq: Once | INTRAVENOUS | Status: AC
  Administered 2020-09-18: 1000 mL via INTRAVENOUS

## 2020-09-18 MED ORDER — DOXYCYCLINE HYCLATE 100 MG PO TABS
100.0000 mg | ORAL_TABLET | Freq: Once | ORAL | Status: AC
  Administered 2020-09-18: 100 mg via ORAL
  Filled 2020-09-18: qty 1

## 2020-09-18 NOTE — ED Provider Notes (Signed)
[facility] EMERGENCY DEPARTMENT Provider Note  CSN: [id number] Arrival date & time: 09/17/20 2248    History Chief Complaint  Patient presents with  . Abscess    HPI  Misty [name] is a 30 y.o. female with history of poorly controlled DM and recurrent skin infections was recently admitted for DKA and skin abscess on lower back that was drained in the ED. She was admitted for DKA and given Rocephin followed by Vancomycin with improvement. She was changed from Metformin to Lantus/Regular insulin and discharged with Bactrim. She reports compliance with both. In the last 2-3 days she reports increased glucose and increased pain/swelling to her lower back. She reports some drainage of purulent material yesterday. She called EMS last night and was reportedly febrile with them, given APAP prior to arrival, has been afebrile while awaiting an ED exam room overnight.   Past Medical History:  Diagnosis Date  . Allergy   . Anemia 2015  . Asthma    as child  . Diabetes mellitus without complication ([place]) 4128  . Eczema   . Hypertension 09/2013    Past Surgical History:  Procedure Laterality Date  . Abcess on back      Family History  Problem Relation Age of Onset  . Hypertension Mother   . Heart disease Mother 2       CHF  . Diabetes Mother   . Sickle cell anemia Brother   . Hypertension Maternal Grandfather   . Diabetes Maternal Grandfather   . Heart disease Maternal Grandfather   . Healthy Father   . Diabetes Maternal Grandmother   . Heart disease Maternal Grandmother     Social History   Tobacco Use  . Smoking status: Former Smoker    Packs/day: 0.50    Years: 2.00    Pack years: 1.00    Quit date: 09/08/2013    Years since quitting: 7.0  . Smokeless tobacco: Never Used  Vaping Use  . Vaping Use: Never used  Substance Use Topics  . Alcohol use: No    Alcohol/week: 0.0 standard drinks  . Drug use: No     Home Medications Prior to Admission medications    Medication Sig Start Date End Date Taking? Authorizing Provider  doxycycline (VIBRAMYCIN) 100 MG capsule Take 1 capsule (100 mg total) by mouth 2 (two) times daily. 09/18/20  Yes Truddie Hidden, MD  HYDROcodone-acetaminophen (NORCO/VICODIN) 5-325 MG tablet Take 2 tablets by mouth every 4 (four) hours as needed. 09/18/20  Yes Truddie Hidden, MD  amLODipine (NORVASC) 5 MG tablet Take 1 tablet (5 mg total) by mouth daily. 09/18/20   Truddie Hidden, MD  blood glucose meter kit and supplies KIT Dispense based on patient and insurance preference. Use up to four times daily as directed. (FOR ICD-9 250.00, 250.01). 01/10/16   Hedges, Dellis Filbert, PA-C  blood glucose meter kit and supplies Dispense based on patient and insurance preference. Use up to four times daily as directed. (FOR ICD-10 E10.9, E11.9). 09/06/20   Donne Hazel, MD  insulin aspart (NOVOLOG) 100 UNIT/ML FlexPen Inject 8 Units into the skin 3 (three) times daily with meals. 09/06/20   Donne Hazel, MD  insulin detemir (LEVEMIR) 100 UNIT/ML FlexPen Inject 18 Units into the skin daily. 09/06/20   Donne Hazel, MD  Insulin Pen Needle 31G X 5 MM MISC 1 Device by Does not apply route 4 (four) times daily. For use with insulin pens 09/06/20   Marylu Lund  K, MD  metoCLOPramide (REGLAN) 5 MG tablet Take 1 tablet (5 mg total) by mouth 3 (three) times daily before meals. 09/06/20 10/06/20  Donne Hazel, MD  cetirizine (ZYRTEC) 10 MG tablet Take 1 tablet (10 mg total) by mouth daily. Patient not taking: Reported on 02/25/2020 01/16/20 07/02/20  Loura Halt A, NP  dicyclomine (BENTYL) 20 MG tablet Take 1 tablet (20 mg total) by mouth 2 (two) times daily. 08/15/18 01/16/20  Recardo Evangelist, PA-C  fluticasone (FLONASE) 50 MCG/ACT nasal spray Place 2 sprays into both nostrils daily. 01/14/19 01/16/20  Blanchie Dessert, MD  ipratropium (ATROVENT) 0.06 % nasal spray Place 2 sprays into both nostrils 4 (four) times daily. 01/14/19 01/16/20  Blanchie Dessert, MD  omeprazole (PRILOSEC) 20 MG capsule Take 1 capsule (20 mg total) by mouth daily. 01/14/19 01/16/20  Blanchie Dessert, MD     Allergies    Iodides, Morphine and related, Shellfish allergy, and Ultram [tramadol hcl]   Review of Systems   Review of Systems A comprehensive review of systems was completed and negative except as noted in HPI.    Physical Exam BP 122/77 (BP Location: Left Arm)   Pulse 70   Temp 98.6 F (37 C) (Oral)   Resp 18   SpO2 98%   Physical Exam Vitals and nursing note reviewed.  Constitutional:      Appearance: Normal appearance.  HENT:     Head: Normocephalic and atraumatic.     Nose: Nose normal.     Mouth/Throat:     Mouth: Mucous membranes are moist.  Eyes:     Extraocular Movements: Extraocular movements intact.     Conjunctiva/sclera: Conjunctivae normal.  Cardiovascular:     Rate and Rhythm: Normal rate.  Pulmonary:     Effort: Pulmonary effort is normal.     Breath sounds: Normal breath sounds.  Abdominal:     General: Abdomen is flat.     Palpations: Abdomen is soft.     Tenderness: There is no abdominal tenderness.  Musculoskeletal:        General: No swelling. Normal range of motion.     Cervical back: Neck supple.  Skin:    General: Skin is warm and dry.     Comments: 5cm x 4cm area of induration and erythema to L lower back, no focal fluctuance, no drainable fluid collection on POCUS.   Neurological:     General: No focal deficit present.     Mental Status: She is alert.  Psychiatric:        Mood and Affect: Mood normal.      ED Results / Procedures / Treatments   Labs (all labs ordered are listed, but only abnormal results are displayed) Labs Reviewed  BASIC METABOLIC PANEL - Abnormal; Notable for the following components:      Result Value   Sodium 130 (*)    Chloride 96 (*)    Glucose, Bld 491 (*)    All other components within normal limits  CBC - Abnormal; Notable for the following components:   RBC  3.72 (*)    Hemoglobin 11.4 (*)    HCT 35.1 (*)    Platelets 457 (*)    All other components within normal limits  URINALYSIS, ROUTINE W REFLEX MICROSCOPIC - Abnormal; Notable for the following components:   Color, Urine STRAW (*)    Specific Gravity, Urine 1.037 (*)    Glucose, UA >=500 (*)    All other components within normal limits  CBG MONITORING, ED - Abnormal; Notable for the following components:   Glucose-Capillary 474 (*)    All other components within normal limits  CBG MONITORING, ED - Abnormal; Notable for the following components:   Glucose-Capillary 314 (*)    All other components within normal limits  CBG MONITORING, ED - Abnormal; Notable for the following components:   Glucose-Capillary 273 (*)    All other components within normal limits  I-STAT BETA HCG BLOOD, ED ([facility], [facility], [facility] ONLY)    EKG None  Radiology No results found.  Procedures Procedures  Medications Ordered in the ED Medications  ibuprofen (ADVIL) tablet 600 mg (600 mg Oral Given 09/17/20 2305)  sodium chloride 0.9 % bolus 1,000 mL (0 mLs Intravenous Stopped 09/18/20 0944)  insulin aspart (novoLOG) injection 10 Units (10 Units Subcutaneous Given 09/18/20 0845)  doxycycline (VIBRA-TABS) tablet 100 mg (100 mg Oral Given 09/18/20 0834)  HYDROcodone-acetaminophen (NORCO/VICODIN) 5-325 MG per tablet 1 tablet (1 tablet Oral Given 09/18/20 0834)     MDM Rules/Calculators/A&P MDM Patient with DM now on insulin here with worsening sugars and recurrence of infection on L lower back. She has hyperglycemia but normal bicarb and anion gap on labs done overnight. No leukocytosis. No drainable abscess. Will give her morning dose of insulin and IVF to correct sugar. Start doxycycline for her cellulitis as this has worked well for her in the past.   ED Course  I have reviewed the triage vital signs and the nursing notes.  Pertinent labs & imaging results that were available during my care of the patient were  reviewed by me and considered in my medical decision making (see chart for details).  Clinical Course as of 09/18/20 1051  Sun Sep 18, 2020  0941 Glucose improving, plan discharge with Rx for doxycycline, norco for pain and PCP follow up as scheduled.  [CS]    Clinical Course User Index [CS] Truddie Hidden, MD    Final Clinical Impression(s) / ED Diagnoses Final diagnoses:  Cellulitis of other specified site  Hyperglycemia    Rx / DC Orders ED Discharge Orders         Ordered    doxycycline (VIBRAMYCIN) 100 MG capsule  2 times daily        09/18/20 0947    HYDROcodone-acetaminophen (NORCO/VICODIN) 5-325 MG tablet  Every 4 hours PRN        09/18/20 0947    amLODipine (NORVASC) 5 MG tablet  Daily        09/18/20 0947           Truddie Hidden, MD 09/18/20 1051

## 2020-09-28 ENCOUNTER — Other Ambulatory Visit (INDEPENDENT_AMBULATORY_CARE_PROVIDER_SITE_OTHER): Payer: Self-pay | Admitting: Primary Care

## 2020-09-28 ENCOUNTER — Encounter (INDEPENDENT_AMBULATORY_CARE_PROVIDER_SITE_OTHER): Payer: Self-pay | Admitting: Primary Care

## 2020-09-28 ENCOUNTER — Ambulatory Visit (INDEPENDENT_AMBULATORY_CARE_PROVIDER_SITE_OTHER): Payer: 59 | Admitting: Primary Care

## 2020-09-28 ENCOUNTER — Other Ambulatory Visit: Payer: Self-pay

## 2020-09-28 VITALS — BP 134/88 | HR 78 | Temp 97.5°F | Ht 63.0 in | Wt 173.4 lb

## 2020-09-28 DIAGNOSIS — M62830 Muscle spasm of back: Secondary | ICD-10-CM | POA: Diagnosis not present

## 2020-09-28 DIAGNOSIS — E119 Type 2 diabetes mellitus without complications: Secondary | ICD-10-CM

## 2020-09-28 DIAGNOSIS — R112 Nausea with vomiting, unspecified: Secondary | ICD-10-CM | POA: Diagnosis not present

## 2020-09-28 DIAGNOSIS — Z7689 Persons encountering health services in other specified circumstances: Secondary | ICD-10-CM

## 2020-09-28 DIAGNOSIS — Z23 Encounter for immunization: Secondary | ICD-10-CM

## 2020-09-28 DIAGNOSIS — Z09 Encounter for follow-up examination after completed treatment for conditions other than malignant neoplasm: Secondary | ICD-10-CM

## 2020-09-28 MED ORDER — AMLODIPINE BESYLATE 5 MG PO TABS: 5.0000 mg | ORAL_TABLET | Freq: Every day | ORAL | 0 refills | Status: DC

## 2020-09-28 MED ORDER — NAPROXEN 500 MG PO TABS: 500.0000 mg | ORAL_TABLET | Freq: Two times a day (BID) | ORAL | 1 refills | Status: DC

## 2020-09-28 MED ORDER — INSULIN ASPART 100 UNIT/ML FLEXPEN: 12.0000 [IU] | PEN_INJECTOR | Freq: Three times a day (TID) | SUBCUTANEOUS | 1 refills | Status: DC

## 2020-09-28 MED ORDER — TRULICITY 0.75 MG/0.5ML ~~LOC~~ SOAJ: 0.7500 mg | SUBCUTANEOUS | 6 refills | Status: DC

## 2020-09-28 MED ORDER — CYCLOBENZAPRINE HCL 10 MG PO TABS: 10.0000 mg | ORAL_TABLET | Freq: Three times a day (TID) | ORAL | 0 refills | Status: DC | PRN

## 2020-09-28 MED ORDER — METOCLOPRAMIDE HCL 5 MG PO TABS: 5.0000 mg | ORAL_TABLET | Freq: Three times a day (TID) | ORAL | 0 refills | Status: DC

## 2020-09-28 NOTE — Patient Instructions (Signed)
Diabetes Mellitus and Nutrition, Adult When you have diabetes, or diabetes mellitus, it is very important to have healthy eating habits because your blood sugar (glucose) levels are greatly affected by what you eat and drink. Eating healthy foods in the right amounts, at about the same times every day, can help you:  Control your blood glucose.  Lower your risk of heart disease.  Improve your blood pressure.  Reach or maintain a healthy weight. What can affect my meal plan? Every person with diabetes is different, and each person has different needs for a meal plan. Your health care provider may recommend that you work with a dietitian to make a meal plan that is best for you. Your meal plan may vary depending on factors such as:  The calories you need.  The medicines you take.  Your weight.  Your blood glucose, blood pressure, and cholesterol levels.  Your activity level.  Other health conditions you have, such as heart or kidney disease. How do carbohydrates affect me? Carbohydrates, also called carbs, affect your blood glucose level more than any other type of food. Eating carbs naturally raises the amount of glucose in your blood. Carb counting is a method for keeping track of how many carbs you eat. Counting carbs is important to keep your blood glucose at a healthy level, especially if you use insulin or take certain oral diabetes medicines. It is important to know how many carbs you can safely have in each meal. This is different for every person. Your dietitian can help you calculate how many carbs you should have at each meal and for each snack. How does alcohol affect me? Alcohol can cause a sudden decrease in blood glucose (hypoglycemia), especially if you use insulin or take certain oral diabetes medicines. Hypoglycemia can be a life-threatening condition. Symptoms of hypoglycemia, such as sleepiness, dizziness, and confusion, are similar to symptoms of having too much  alcohol.  Do not drink alcohol if: ? Your health care provider tells you not to drink. ? You are pregnant, may be pregnant, or are planning to become pregnant.  If you drink alcohol: ? Do not drink on an empty stomach. ? Limit how much you use to:  0-1 drink a day for women.  0-2 drinks a day for men. ? Be aware of how much alcohol is in your drink. In the [place] one drink equals one 12 oz bottle of beer (355 mL), one 5 oz glass of wine (148 mL), or one 1 oz glass of hard liquor (44 mL). ? Keep yourself hydrated with water, diet soda, or unsweetened iced tea.  Keep in mind that regular soda, juice, and other mixers may contain a lot of sugar and must be counted as carbs. What are tips for following this plan? Reading food labels  Start by checking the serving size on the "Nutrition Facts" label of packaged foods and drinks. The amount of calories, carbs, fats, and other nutrients listed on the label is based on one serving of the item. Many items contain more than one serving per package.  Check the total grams (g) of carbs in one serving. You can calculate the number of servings of carbs in one serving by dividing the total carbs by 15. For example, if a food has 30 g of total carbs per serving, it would be equal to 2 servings of carbs.  Check the number of grams (g) of saturated fats and trans fats in one serving. Choose foods that have   a low amount or none of these fats.  Check the number of milligrams (mg) of salt (sodium) in one serving. Most people should limit total sodium intake to less than 2,300 mg per day.  Always check the nutrition information of foods labeled as "low-fat" or "nonfat." These foods may be higher in added sugar or refined carbs and should be avoided.  Talk to your dietitian to identify your daily goals for nutrients listed on the label. Shopping  Avoid buying canned, pre-made, or processed foods. These foods tend to be high in fat, sodium, and added  sugar.  Shop around the outside edge of the grocery store. This is where you will most often find fresh fruits and vegetables, bulk grains, fresh meats, and fresh dairy. Cooking  Use low-heat cooking methods, such as baking, instead of high-heat cooking methods like deep frying.  Cook using healthy oils, such as olive, canola, or sunflower oil.  Avoid cooking with butter, cream, or high-fat meats. Meal planning  Eat meals and snacks regularly, preferably at the same times every day. Avoid going long periods of time without eating.  Eat foods that are high in fiber, such as fresh fruits, vegetables, beans, and whole grains. Talk with your dietitian about how many servings of carbs you can eat at each meal.  Eat 4-6 oz (112-168 g) of lean protein each day, such as lean meat, chicken, fish, eggs, or tofu. One ounce (oz) of lean protein is equal to: ? 1 oz (28 g) of meat, chicken, or fish. ? 1 egg. ?  cup (62 g) of tofu.  Eat some foods each day that contain healthy fats, such as avocado, nuts, seeds, and fish.   What foods should I eat? Fruits Berries. Apples. Oranges. Peaches. Apricots. Plums. Grapes. Mango. Papaya. Pomegranate. Kiwi. Cherries. Vegetables Lettuce. Spinach. Leafy greens, including kale, chard, collard greens, and mustard greens. Beets. Cauliflower. Cabbage. Broccoli. Carrots. Green beans. Tomatoes. Peppers. Onions. Cucumbers. Brussels sprouts. Grains Whole grains, such as whole-wheat or whole-grain bread, crackers, tortillas, cereal, and pasta. Unsweetened oatmeal. Quinoa. Brown or wild rice. Meats and other proteins Seafood. Poultry without skin. Lean cuts of poultry and beef. Tofu. Nuts. Seeds. Dairy Low-fat or fat-free dairy products such as milk, yogurt, and cheese. The items listed above may not be a complete list of foods and beverages you can eat. Contact a dietitian for more information. What foods should I avoid? Fruits Fruits canned with  syrup. Vegetables Canned vegetables. Frozen vegetables with butter or cream sauce. Grains Refined white flour and flour products such as bread, pasta, snack foods, and cereals. Avoid all processed foods. Meats and other proteins Fatty cuts of meat. Poultry with skin. Breaded or fried meats. Processed meat. Avoid saturated fats. Dairy Full-fat yogurt, cheese, or milk. Beverages Sweetened drinks, such as soda or iced tea. The items listed above may not be a complete list of foods and beverages you should avoid. Contact a dietitian for more information. Questions to ask a health care provider  Do I need to meet with a diabetes educator?  Do I need to meet with a dietitian?  What number can I call if I have questions?  When are the best times to check my blood glucose? Where to find more information:  American Diabetes Association: diabetes.org  Academy of Nutrition and Dietetics: www.eatright.org  National Institute of Diabetes and Digestive and Kidney Diseases: www.niddk.nih.gov  Association of Diabetes Care and Education Specialists: www.diabeteseducator.org Summary  It is important to have healthy eating   habits because your blood sugar (glucose) levels are greatly affected by what you eat and drink.  A healthy meal plan will help you control your blood glucose and maintain a healthy lifestyle.  Your health care provider may recommend that you work with a dietitian to make a meal plan that is best for you.  Keep in mind that carbohydrates (carbs) and alcohol have immediate effects on your blood glucose levels. It is important to count carbs and to use alcohol carefully. This information is not intended to replace advice given to you by your health care provider. Make sure you discuss any questions you have with your health care provider. Document Revised: 07/28/2019 Document Reviewed: 07/28/2019 Elsevier Patient Education  2021 [employer]  

## 2020-09-28 NOTE — Progress Notes (Signed)
Pt complains of back pain from an abcess she has. She is currently taking abx for it

## 2020-09-28 NOTE — Progress Notes (Signed)
New Patient Office Visit  Subjective:  Patient ID: De Nurse, female    DOB: 1991/03/31  Age: 30 y.o. MRN: [id number]  CC:  Chief Complaint  Patient presents with  . Hospitalization Follow-up    Cellulitis     HPI [name] presents for 30 y.o.female presents for follow up from the hospital. Admit date to the hospital was 09/17/20, patient was discharged from the hospital on 09/18/20, patient was admitted for for DKA and skin abscess on lower back that was drained in the ED. She was admitted for DKA and given Rocephin followed by Vancomycin with improvement. She was changed from Metformin to Lantus/Regular insulin and discharged with Bactrim.Area on back still painful requesting pain medication and something for muscle spasm. Establishing Care.  Past Medical History:  Diagnosis Date  . Allergy   . Anemia 2015  . Asthma    as child  . Diabetes mellitus without complication ([place]) 8119  . Eczema   . Hypertension 09/2013    Past Surgical History:  Procedure Laterality Date  . Abcess on back      Family History  Problem Relation Age of Onset  . Hypertension Mother   . Heart disease Mother 4       CHF  . Diabetes Mother   . Sickle cell anemia Brother   . Hypertension Maternal Grandfather   . Diabetes Maternal Grandfather   . Heart disease Maternal Grandfather   . Healthy Father   . Diabetes Maternal Grandmother   . Heart disease Maternal Grandmother     Social History   Socioeconomic History  . Marital status: Significant Other    Spouse name: Not on file  . Number of children: Not on file  . Years of education: Not on file  . Highest education level: Not on file  Occupational History  . Not on file  Tobacco Use  . Smoking status: Former Smoker    Packs/day: 0.50    Years: 2.00    Pack years: 1.00    Quit date: 09/08/2013    Years since quitting: 7.0  . Smokeless tobacco: Never Used  Vaping Use  . Vaping Use: Never used  Substance and Sexual  Activity  . Alcohol use: No    Alcohol/week: 0.0 standard drinks  . Drug use: No  . Sexual activity: Yes    Birth control/protection: None    Comment: 1st intercourse 30 yo--1 partner ( has been same partner 3 yrs)   Other Topics Concern  . Not on file  Social History Narrative   Works at [employer] Strain: Not on [employer] Insecurity: Not on file  Transportation Needs: Not on file  Physical Activity: Not on file  Stress: Not on file  Social Connections: Not on file  Intimate Partner Violence: Not on file    ROS rt eye  Review of Systems  All other systems reviewed and are negative.   Objective:   Today's Vitals: BP 134/88 (BP Location: Right Arm, Patient Position: Sitting, Cuff Size: Normal)   Pulse 78   Temp (!) 97.5 F (36.4 C) (Temporal)   Ht $R'5\' 3"'hD$  (1.6 m)   Wt 173 lb 6.4 oz (78.7 kg)   LMP 09/03/2020 (Approximate)   SpO2 100%   BMI 30.72 kg/m   Physical Exam Vitals reviewed.  Constitutional:      Appearance: She is obese.  HENT:     Right Ear:  External ear normal.     Left Ear: External ear normal.     Nose: Nose normal.  Eyes:     Extraocular Movements: Extraocular movements intact.     Pupils: Pupils are equal, round, and reactive to light.  Cardiovascular:     Rate and Rhythm: Normal rate and regular rhythm.  Pulmonary:     Effort: Pulmonary effort is normal.     Breath sounds: Normal breath sounds.  Abdominal:     General: Bowel sounds are normal. There is distension.     Palpations: Abdomen is soft.  Musculoskeletal:     Cervical back: Normal range of motion and neck supple.  Skin:    Comments:  skin abscess on lower back  Neurological:     Mental Status: She is alert and oriented to person, place, and time.  Psychiatric:        Mood and Affect: Mood normal.        Behavior: Behavior normal.        Thought Content: Thought content normal.        Judgment: Judgment normal.      Assessment & Plan:  [name] was seen today for hospitalization follow-up.  Diagnoses and all orders for this visit:  Need for Tdap vaccination Tdap is recommended every 10 years for adults. Recommend by the CDC. -     Tdap vaccine greater than or equal to 7yo IM  Encounter to establish care Establishing care with new provider   Type 2 diabetes mellitus without complication, without long-term current use of insulin ([place])  Complications from uncontrolled diabetes -diabetic retinopathy leading to blindness, diabetic nephropathy leading to dialysis, decrease in circulation decrease in sores or wound healing which may lead to amputations and increase of heart attack and stroke -     Microalbumin/Creatinine Ratio, Urine -     insulin aspart (NOVOLOG) 100 UNIT/ML FlexPen; Inject 12 Units into the skin 3 (three) times daily with meals. For blood sugars 0-150 give 0 units of insulin, 151-200 give 0 units of insulin, 201-250 give 4 units, 251-300 give 6 units, 301-350 give 8 units, 351-400 give 10 units,> 400 give 12 units and call M.D. Discussed hypoglycemia protocol. -     Dulaglutide (TRULICITY) 7.84 ON/6.2XB SOPN; Inject 0.75 mg into the skin once a week.  Nausea and vomiting, intractability of vomiting not specified, unspecified vomiting type -     metoCLOPramide (REGLAN) 5 MG tablet; Take 1 tablet (5 mg total) by mouth 3 (three) times daily before meals.  Hospital discharge follow-up . Admit date to the hospital was 09/17/20, patient was discharged from the hospital on 09/18/20, patient was admitted for for DKA and skin abscess on lower back that was drained in the ED  Muscle spasm of back -     cyclobenzaprine (FLEXERIL) 10 MG tablet; Take 1 tablet (10 mg total) by mouth 3 (three) times daily as needed for muscle spasms.  Other orders -     amLODipine (NORVASC) 5 MG tablet; Take 1 tablet (5 mg total) by mouth daily. -     naproxen (NAPROSYN) 500 MG tablet; Take 1 tablet (500 mg  total) by mouth 2 (two) times daily with a meal.    Outpatient Encounter Medications as of 09/28/2020  Medication Sig  . blood glucose meter kit and supplies KIT Dispense based on patient and insurance preference. Use up to four times daily as directed. (FOR ICD-9 250.00, 250.01).  . blood glucose meter kit and supplies Dispense  based on patient and insurance preference. Use up to four times daily as directed. (FOR ICD-10 E10.9, E11.9).  . cyclobenzaprine (FLEXERIL) 10 MG tablet Take 1 tablet (10 mg total) by mouth 3 (three) times daily as needed for muscle spasms.  Marland Kitchen doxycycline (VIBRAMYCIN) 100 MG capsule Take 1 capsule (100 mg total) by mouth 2 (two) times daily.  . Dulaglutide (TRULICITY) 8.72 BM/1.8MQ SOPN Inject 0.75 mg into the skin once a week.  Marland Kitchen HYDROcodone-acetaminophen (NORCO/VICODIN) 5-325 MG tablet Take 2 tablets by mouth every 4 (four) hours as needed.  . Insulin Pen Needle 31G X 5 MM MISC 1 Device by Does not apply route 4 (four) times daily. For use with insulin pens  . naproxen (NAPROSYN) 500 MG tablet Take 1 tablet (500 mg total) by mouth 2 (two) times daily with a meal.  . [DISCONTINUED] amLODipine (NORVASC) 5 MG tablet Take 1 tablet (5 mg total) by mouth daily.  . [DISCONTINUED] insulin aspart (NOVOLOG) 100 UNIT/ML FlexPen Inject 8 Units into the skin 3 (three) times daily with meals.  . [DISCONTINUED] insulin detemir (LEVEMIR) 100 UNIT/ML FlexPen Inject 18 Units into the skin daily.  . [DISCONTINUED] metoCLOPramide (REGLAN) 5 MG tablet Take 1 tablet (5 mg total) by mouth 3 (three) times daily before meals.  Marland Kitchen amLODipine (NORVASC) 5 MG tablet Take 1 tablet (5 mg total) by mouth daily.  . insulin aspart (NOVOLOG) 100 UNIT/ML FlexPen Inject 12 Units into the skin 3 (three) times daily with meals. For blood sugars 0-150 give 0 units of insulin, 151-200 give 0 units of insulin, 201-250 give 4 units, 251-300 give 6 units, 301-350 give 8 units, 351-400 give 10 units,> 400 give 12  units and call M.D. Discussed hypoglycemia protocol.  . metoCLOPramide (REGLAN) 5 MG tablet Take 1 tablet (5 mg total) by mouth 3 (three) times daily before meals.  . [DISCONTINUED] cetirizine (ZYRTEC) 10 MG tablet Take 1 tablet (10 mg total) by mouth daily. (Patient not taking: Reported on 02/25/2020)  . [DISCONTINUED] dicyclomine (BENTYL) 20 MG tablet Take 1 tablet (20 mg total) by mouth 2 (two) times daily.  . [DISCONTINUED] fluticasone (FLONASE) 50 MCG/ACT nasal spray Place 2 sprays into both nostrils daily.  . [DISCONTINUED] ipratropium (ATROVENT) 0.06 % nasal spray Place 2 sprays into both nostrils 4 (four) times daily.  . [DISCONTINUED] omeprazole (PRILOSEC) 20 MG capsule Take 1 capsule (20 mg total) by mouth daily.   No facility-administered encounter medications on file as of 09/28/2020.    Follow-up: Return in about 3 months (around 12/27/2020) for Lurena Joiner  first avaible for teachin insulin  /PCP DM .   Kerin Perna, NP

## 2020-09-29 LAB — MICROALBUMIN / CREATININE URINE RATIO
Creatinine, Urine: 120 mg/dL
Microalb/Creat Ratio: 6 mg/g creat (ref 0–29)
Microalbumin, Urine: 7.4 ug/mL

## 2020-10-03 ENCOUNTER — Ambulatory Visit: Payer: 59 | Admitting: Pharmacist

## 2020-10-05 ENCOUNTER — Encounter (HOSPITAL_COMMUNITY): Payer: Self-pay | Admitting: *Deleted

## 2020-10-05 ENCOUNTER — Emergency Department (HOSPITAL_COMMUNITY)
Admission: EM | Admit: 2020-10-05 | Discharge: 2020-10-06 | Disposition: A | Discharge status: Home / Self Care | Payer: 59 | Attending: Emergency Medicine | Admitting: Emergency Medicine

## 2020-10-05 ENCOUNTER — Other Ambulatory Visit: Payer: Self-pay

## 2020-10-05 DIAGNOSIS — R350 Frequency of micturition: Secondary | ICD-10-CM | POA: Diagnosis not present

## 2020-10-05 DIAGNOSIS — R5383 Other fatigue: Secondary | ICD-10-CM | POA: Insufficient documentation

## 2020-10-05 DIAGNOSIS — Z5321 Procedure and treatment not carried out due to patient leaving prior to being seen by health care provider: Secondary | ICD-10-CM | POA: Insufficient documentation

## 2020-10-05 DIAGNOSIS — R531 Weakness: Secondary | ICD-10-CM | POA: Insufficient documentation

## 2020-10-05 LAB — BASIC METABOLIC PANEL
Anion gap: 6 (ref 5–15)
BUN: 10 mg/dL (ref 6–20)
CO2: 26 mmol/L (ref 22–32)
Calcium: 8.4 mg/dL — ABNORMAL LOW (ref 8.9–10.3)
Chloride: 103 mmol/L (ref 98–111)
Creatinine, Ser: 0.7 mg/dL (ref 0.44–1.00)
GFR, Estimated: >60 mL/min (ref >60)
Glucose, Bld: 193 mg/dL — ABNORMAL HIGH (ref 70–99)
Potassium: 3.7 mmol/L (ref 3.5–5.1)
Sodium: 135 mmol/L (ref 135–145)

## 2020-10-05 LAB — CBC
HCT: 33.3 % — ABNORMAL LOW (ref 36.0–46.0)
Hemoglobin: 10.6 g/dL — ABNORMAL LOW (ref 12.0–15.0)
MCH: 30.5 pg (ref 26.0–34.0)
MCHC: 31.8 g/dL (ref 30.0–36.0)
MCV: 96 fL (ref 80.0–100.0)
Platelets: 362 10*3/uL (ref 150–400)
RBC: 3.47 MIL/uL — ABNORMAL LOW (ref 3.87–5.11)
RDW: 13.4 % (ref 11.5–15.5)
WBC: 5.5 10*3/uL (ref 4.0–10.5)
nRBC: 0 % (ref 0.0–0.2)

## 2020-10-05 LAB — I-STAT BETA HCG BLOOD, ED (MC, WL, AP ONLY): I-stat hCG, quantitative: <5 m[IU]/mL (ref <5)

## 2020-10-05 LAB — URINALYSIS, ROUTINE W REFLEX MICROSCOPIC
Bilirubin Urine: NEGATIVE
Glucose, UA: NEGATIVE mg/dL
Hgb urine dipstick: NEGATIVE
Ketones, ur: NEGATIVE mg/dL
Leukocytes,Ua: NEGATIVE
Nitrite: NEGATIVE
Protein, ur: NEGATIVE mg/dL
Specific Gravity, Urine: 1.019 (ref 1.005–1.030)
pH: 5 (ref 5.0–8.0)

## 2020-10-05 LAB — CBG MONITORING, ED: Glucose-Capillary: 150 mg/dL — ABNORMAL HIGH (ref 70–99)

## 2020-10-05 NOTE — ED Triage Notes (Signed)
Pt brought in by [facility] from home, with c/o does not feel good and felt like cbg was high. CBG 283 for EMS. Takes Humalog/Novolog, taking as prescribed. 164/86, hr 90, saturations 98%.

## 2020-10-05 NOTE — ED Triage Notes (Signed)
Pt states she feels like her sugar has been high. She has had urinary frequency and for 1 hour PTA felt fatigued. Last checked her CBG about 2 days ago- has been in the 300's. Taking medications as prescribed.

## 2020-10-06 NOTE — ED Notes (Signed)
Called Pt for vitals several times no answer.

## 2020-10-12 ENCOUNTER — Emergency Department (HOSPITAL_COMMUNITY)
Admission: EM | Admit: 2020-10-12 | Discharge: 2020-10-12 | Disposition: A | Discharge status: Home / Self Care | Payer: 59 | Attending: Emergency Medicine | Admitting: Emergency Medicine

## 2020-10-12 ENCOUNTER — Encounter (HOSPITAL_COMMUNITY): Payer: Self-pay | Admitting: Emergency Medicine

## 2020-10-12 DIAGNOSIS — I10 Essential (primary) hypertension: Secondary | ICD-10-CM | POA: Diagnosis not present

## 2020-10-12 DIAGNOSIS — H538 Other visual disturbances: Secondary | ICD-10-CM | POA: Diagnosis present

## 2020-10-12 DIAGNOSIS — Z79899 Other long term (current) drug therapy: Secondary | ICD-10-CM | POA: Diagnosis not present

## 2020-10-12 DIAGNOSIS — E1011 Type 1 diabetes mellitus with ketoacidosis with coma: Secondary | ICD-10-CM | POA: Insufficient documentation

## 2020-10-12 DIAGNOSIS — J45909 Unspecified asthma, uncomplicated: Secondary | ICD-10-CM | POA: Insufficient documentation

## 2020-10-12 DIAGNOSIS — Z794 Long term (current) use of insulin: Secondary | ICD-10-CM | POA: Insufficient documentation

## 2020-10-12 DIAGNOSIS — Z87891 Personal history of nicotine dependence: Secondary | ICD-10-CM | POA: Diagnosis not present

## 2020-10-12 LAB — COMPREHENSIVE METABOLIC PANEL
ALT: 16 U/L (ref 0–44)
AST: 16 U/L (ref 15–41)
Albumin: 3.7 g/dL (ref 3.5–5.0)
Alkaline Phosphatase: 66 U/L (ref 38–126)
Anion gap: 7 (ref 5–15)
BUN: 7 mg/dL (ref 6–20)
CO2: 26 mmol/L (ref 22–32)
Calcium: 9.2 mg/dL (ref 8.9–10.3)
Chloride: 101 mmol/L (ref 98–111)
Creatinine, Ser: 0.75 mg/dL (ref 0.44–1.00)
GFR, Estimated: >60 mL/min (ref >60)
Glucose, Bld: 197 mg/dL — ABNORMAL HIGH (ref 70–99)
Potassium: 4 mmol/L (ref 3.5–5.1)
Sodium: 134 mmol/L — ABNORMAL LOW (ref 135–145)
Total Bilirubin: 0.7 mg/dL (ref 0.3–1.2)
Total Protein: 6.9 g/dL (ref 6.5–8.1)

## 2020-10-12 LAB — CBC
HCT: 35.4 % — ABNORMAL LOW (ref 36.0–46.0)
Hemoglobin: 11.5 g/dL — ABNORMAL LOW (ref 12.0–15.0)
MCH: 31.2 pg (ref 26.0–34.0)
MCHC: 32.5 g/dL (ref 30.0–36.0)
MCV: 95.9 fL (ref 80.0–100.0)
Platelets: 358 10*3/uL (ref 150–400)
RBC: 3.69 MIL/uL — ABNORMAL LOW (ref 3.87–5.11)
RDW: 12.8 % (ref 11.5–15.5)
WBC: 4.8 10*3/uL (ref 4.0–10.5)
nRBC: 0 % (ref 0.0–0.2)

## 2020-10-12 LAB — CBG MONITORING, ED: Glucose-Capillary: 164 mg/dL — ABNORMAL HIGH (ref 70–99)

## 2020-10-12 LAB — I-STAT BETA HCG BLOOD, ED (MC, WL, AP ONLY): I-stat hCG, quantitative: <5 m[IU]/mL (ref <5)

## 2020-10-12 LAB — LIPASE, BLOOD: Lipase: 26 U/L (ref 11–51)

## 2020-10-12 MED ORDER — IBUPROFEN 400 MG PO TABS
600.0000 mg | ORAL_TABLET | Freq: Once | ORAL | Status: AC
  Administered 2020-10-12: 600 mg via ORAL
  Filled 2020-10-12: qty 1

## 2020-10-12 NOTE — Discharge Instructions (Addendum)
You were evaluated in the Emergency Department and after careful evaluation, we did not find any emergent condition requiring admission or further testing in the hospital.    Please return to the Emergency Department if you experience any worsening of your condition.  We encourage you to follow up with a primary care provider.  Thank you for allowing [place] to be a part of your care.  Please make sure to follow-up with your primary care doctor for better control of your blood sugars.  Return to the ER for any new or worsening symptoms.

## 2020-10-12 NOTE — ED Notes (Signed)
E-signature pad unavailable at time of pt discharge. This RN discussed discharge materials with pt and answered all pt questions. Pt stated understanding of discharge material. ? ?

## 2020-10-12 NOTE — ED Triage Notes (Signed)
Patient complains of intermittent blurred vision, patient states her vision gets blurry when her blood sugar gets close to 100. Patient states she has poor control of blood sugar and that it is usually around 500. Patient states blurriness started around 0200 this morning and so she ate something and the blurred vision resolved.

## 2020-10-12 NOTE — ED Notes (Signed)
Patient BIB [facility] for blurred vision earlier starting approximately 1700. Patient stated she thought maybe her sugar was low, CBG 90 with EMS. Patient states her normal blood sugars are 400-500.

## 2020-10-12 NOTE — ED Provider Notes (Signed)
[facility] EMERGENCY DEPARTMENT Provider Note   CSN: [id number] Arrival date & time: 10/12/20  1725     History Chief Complaint  Patient presents with  . Blurred Vision    Misty [name] is a 30 y.o. female.  HPI 30 year old female with a history of asthma, DM type II, hypertension, DKA presents to the ER with complaints of intermittent blurred vision, which she associates with low blood sugars.  She states that this will happen when her blood sugars gets around 100.  She reports poor control of her blood sugars, is on insulin and Trulicity.  She states that she will wake up in the middle the night and have the symptoms.  Reports that her sugars regularly run at around 500.  She states that the blurriness of vision is intermittent, with associated dizziness with these episodes.  Symptoms improve with food.  She states that she ate some food while in the waiting room, and this did improve.  However she is now again starting to have some blurry vision and some dizziness.  No nausea or vomiting.  No fevers or chills.  He reports compliance with her medications.    Past Medical History:  Diagnosis Date  . Allergy   . Anemia 2015  . Asthma    as child  . Diabetes mellitus without complication ([place]) 4098  . Eczema   . Hypertension 09/2013    Patient Active Problem List   Diagnosis Date Noted  . DKA (diabetic ketoacidosis) ([place]) 09/02/2020  . Abscess 09/02/2020  . Hypertension 09/2013    Past Surgical History:  Procedure Laterality Date  . Abcess on back       OB History    Gravida  1   Para  0   Term  0   Preterm  0   AB  1   Living  0     SAB  1   IAB  0   Ectopic  0   Multiple  0   Live Births              Family History  Problem Relation Age of Onset  . Hypertension Mother   . Heart disease Mother 67       CHF  . Diabetes Mother   . Sickle cell anemia Brother   . Hypertension Maternal Grandfather   . Diabetes Maternal  Grandfather   . Heart disease Maternal Grandfather   . Healthy Father   . Diabetes Maternal Grandmother   . Heart disease Maternal Grandmother     Social History   Tobacco Use  . Smoking status: Former Smoker    Packs/day: 0.50    Years: 2.00    Pack years: 1.00    Quit date: 09/08/2013    Years since quitting: 7.0  . Smokeless tobacco: Never Used  Vaping Use  . Vaping Use: Never used  Substance Use Topics  . Alcohol use: No    Alcohol/week: 0.0 standard drinks  . Drug use: No    Home Medications Prior to Admission medications   Medication Sig Start Date End Date Taking? Authorizing Provider  amLODipine (NORVASC) 5 MG tablet Take 1 tablet (5 mg total) by mouth daily. 09/28/20   Kerin Perna, NP  blood glucose meter kit and supplies KIT Dispense based on patient and insurance preference. Use up to four times daily as directed. (FOR ICD-9 250.00, 250.01). 01/10/16   Hedges, Dellis Filbert, PA-C  blood glucose meter kit and supplies  Dispense based on patient and insurance preference. Use up to four times daily as directed. (FOR ICD-10 E10.9, E11.9). 09/06/20   Donne Hazel, MD  cyclobenzaprine (FLEXERIL) 10 MG tablet Take 1 tablet (10 mg total) by mouth 3 (three) times daily as needed for muscle spasms. 09/28/20   Kerin Perna, NP  doxycycline (VIBRAMYCIN) 100 MG capsule Take 1 capsule (100 mg total) by mouth 2 (two) times daily. 09/18/20   Truddie Hidden, MD  Dulaglutide (TRULICITY) 3.01 SW/1.0XN SOPN Inject 0.75 mg into the skin once a week. 09/28/20   Kerin Perna, NP  HYDROcodone-acetaminophen (NORCO/VICODIN) 5-325 MG tablet Take 2 tablets by mouth every 4 (four) hours as needed. 09/18/20   Truddie Hidden, MD  insulin aspart (NOVOLOG) 100 UNIT/ML FlexPen Inject 12 Units into the skin 3 (three) times daily with meals. For blood sugars 0-150 give 0 units of insulin, 151-200 give 0 units of insulin, 201-250 give 4 units, 251-300 give 6 units, 301-350 give 8 units,  351-400 give 10 units,> 400 give 12 units and call M.D. Discussed hypoglycemia protocol. 09/28/20   Kerin Perna, NP  Insulin Pen Needle 31G X 5 MM MISC 1 Device by Does not apply route 4 (four) times daily. For use with insulin pens 09/06/20   Donne Hazel, MD  metoCLOPramide (REGLAN) 5 MG tablet Take 1 tablet (5 mg total) by mouth 3 (three) times daily before meals. 09/28/20 10/28/20  Kerin Perna, NP  naproxen (NAPROSYN) 500 MG tablet Take 1 tablet (500 mg total) by mouth 2 (two) times daily with a meal. 09/28/20   Kerin Perna, NP  cetirizine (ZYRTEC) 10 MG tablet Take 1 tablet (10 mg total) by mouth daily. Patient not taking: Reported on 02/25/2020 01/16/20 07/02/20  Loura Halt A, NP  dicyclomine (BENTYL) 20 MG tablet Take 1 tablet (20 mg total) by mouth 2 (two) times daily. 08/15/18 01/16/20  Recardo Evangelist, PA-C  fluticasone (FLONASE) 50 MCG/ACT nasal spray Place 2 sprays into both nostrils daily. 01/14/19 01/16/20  Blanchie Dessert, MD  ipratropium (ATROVENT) 0.06 % nasal spray Place 2 sprays into both nostrils 4 (four) times daily. 01/14/19 01/16/20  Blanchie Dessert, MD  omeprazole (PRILOSEC) 20 MG capsule Take 1 capsule (20 mg total) by mouth daily. 01/14/19 01/16/20  Blanchie Dessert, MD    Allergies    Iodides, Morphine and related, Shellfish allergy, and Ultram [tramadol hcl]  Review of Systems   Review of Systems  Constitutional: Negative for chills and fever.  HENT: Negative for ear pain and sore throat.   Eyes: Positive for visual disturbance. Negative for pain.  Respiratory: Negative for cough and shortness of breath.   Cardiovascular: Negative for chest pain and palpitations.  Gastrointestinal: Negative for abdominal pain and vomiting.  Genitourinary: Negative for dysuria and hematuria.  Musculoskeletal: Negative for arthralgias and back pain.  Skin: Negative for color change and rash.  Neurological: Positive for dizziness. Negative for seizures and  syncope.  All other systems reviewed and are negative.   Physical Exam Updated Vital Signs BP 129/79 (BP Location: Right Arm)   Pulse 88   Temp 99.5 F (37.5 C) (Oral)   Resp 15   SpO2 94%   Physical Exam Vitals and nursing note reviewed.  Constitutional:      General: She is not in acute distress.    Appearance: She is well-developed and well-nourished. She is not ill-appearing, toxic-appearing or diaphoretic.  HENT:     Head: Normocephalic and  atraumatic.  Eyes:     Conjunctiva/sclera: Conjunctivae normal.  Cardiovascular:     Rate and Rhythm: Normal rate and regular rhythm.     Heart sounds: No murmur heard.   Pulmonary:     Effort: Pulmonary effort is normal. No respiratory distress.     Breath sounds: Normal breath sounds.  Abdominal:     Palpations: Abdomen is soft.     Tenderness: There is no abdominal tenderness.  Musculoskeletal:        General: No edema. Normal range of motion.     Cervical back: Normal range of motion and neck supple.  Skin:    General: Skin is warm and dry.     Capillary Refill: Capillary refill takes less than 2 seconds.  Neurological:     General: No focal deficit present.     Mental Status: She is alert and oriented to person, place, and time.     Comments: Pupils equal and reactive, gross vision intact, EOMs intact, smile equal, no slurred speech, 5/5 strength in upper and lower extremities  Psychiatric:        Mood and Affect: Mood and affect and mood normal.        Behavior: Behavior normal.     ED Results / Procedures / Treatments   Labs (all labs ordered are listed, but only abnormal results are displayed) Labs Reviewed  COMPREHENSIVE METABOLIC PANEL - Abnormal; Notable for the following components:      Result Value   Sodium 134 (*)    Glucose, Bld 197 (*)    All other components within normal limits  CBC - Abnormal; Notable for the following components:   RBC 3.69 (*)    Hemoglobin 11.5 (*)    HCT 35.4 (*)    All  other components within normal limits  CBG MONITORING, ED - Abnormal; Notable for the following components:   Glucose-Capillary 164 (*)    All other components within normal limits  LIPASE, BLOOD  URINALYSIS, ROUTINE W REFLEX MICROSCOPIC  I-STAT BETA HCG BLOOD, ED ([facility], [facility], [facility] ONLY)  CBG MONITORING, ED    EKG None  Radiology No results found.  Procedures Procedures   Medications Ordered in ED Medications  ibuprofen (ADVIL) tablet 600 mg (600 mg Oral Given 10/12/20 2223)    ED Course  I have reviewed the triage vital signs and the nursing notes.  Pertinent labs & imaging results that were available during my care of the patient were reviewed by me and considered in my medical decision making (see chart for details).    MDM Rules/Calculators/A&P                         30 year old female presents to the ER with intermittent episodes of dizziness and blurred vision which she attributes to low blood sugars.  On arrival, her bottles overall reassuring, exam is reassuring, no neuro deficits noted.  She is overall well-appearing, sitting up and eating.  Her lab work ordered in triage, reviewed by me, largely unremarkable.  CBC without leukocytosis, hemoglobin of 11.5.  CMP without any significant electrode abnormalities, normal renal and liver function test.  No anion gap.  Glucose of 197.  Considered hypoglycemia versus possible intracranial abnormality, however neuro exam is reassuring.  Low suspicion for intracranial mass at this time as this has been ongoing for quite some time and seems to be situation based. Discussed pursuing possible further imaging, however patient reassures me that this is  due to her blood sugars.  No evidence of DKA, HHS, severe hypoglycemia. Patient was given multiple sandwiches, crackers here in the ED and notes significant improvement in symptoms.  She was given ibuprofen for a headache.   I did consider having her diabetes team see her, however unfortunately  they are not available at this time of night.  I stressed that she is to follow-up with her primary care doctor, as she likely will require readjustment of her medications.  Case discussed with Dr. Sherry Ruffing who is agreeable to the above plan and disposition   Final Clinical Impression(s) / ED Diagnoses Final diagnoses:  Blurred vision    Rx / DC Orders ED Discharge Orders    None       Lyndel Safe 10/12/20 2315    Tegeler, Gwenyth Allegra, MD 10/12/20 2328

## 2020-10-13 LAB — CBG MONITORING, ED: Glucose-Capillary: 149 mg/dL — ABNORMAL HIGH (ref 70–99)

## 2020-11-30 ENCOUNTER — Ambulatory Visit (INDEPENDENT_AMBULATORY_CARE_PROVIDER_SITE_OTHER): Payer: 59 | Admitting: Primary Care

## 2020-11-30 ENCOUNTER — Other Ambulatory Visit: Payer: Self-pay

## 2020-11-30 ENCOUNTER — Encounter (INDEPENDENT_AMBULATORY_CARE_PROVIDER_SITE_OTHER): Payer: Self-pay | Admitting: Primary Care

## 2020-11-30 VITALS — BP 133/86 | HR 79 | Temp 97.3°F | Ht 63.0 in | Wt 184.2 lb

## 2020-11-30 DIAGNOSIS — E119 Type 2 diabetes mellitus without complications: Secondary | ICD-10-CM

## 2020-11-30 DIAGNOSIS — I10 Essential (primary) hypertension: Secondary | ICD-10-CM | POA: Diagnosis not present

## 2020-11-30 DIAGNOSIS — M62838 Other muscle spasm: Secondary | ICD-10-CM

## 2020-11-30 DIAGNOSIS — N912 Amenorrhea, unspecified: Secondary | ICD-10-CM

## 2020-11-30 DIAGNOSIS — Z7189 Other specified counseling: Secondary | ICD-10-CM | POA: Diagnosis not present

## 2020-11-30 DIAGNOSIS — Z3201 Encounter for pregnancy test, result positive: Secondary | ICD-10-CM

## 2020-11-30 DIAGNOSIS — M62830 Muscle spasm of back: Secondary | ICD-10-CM

## 2020-11-30 LAB — POCT URINE PREGNANCY: Preg Test, Ur: POSITIVE — AB

## 2020-11-30 LAB — POCT GLYCOSYLATED HEMOGLOBIN (HGB A1C): Hemoglobin A1C: 9.1 % — AB (ref 4.0–5.6)

## 2020-11-30 LAB — GLUCOSE, POCT (MANUAL RESULT ENTRY): POC Glucose: 236 mg/dl — AB (ref 70–99)

## 2020-11-30 MED ORDER — INSULIN PEN NEEDLE 31G X 5 MM MISC: 1.0000 | Freq: Four times a day (QID) | 11 refills | Status: DC

## 2020-11-30 MED ORDER — INSULIN ASPART 100 UNIT/ML FLEXPEN: 12.0000 [IU] | PEN_INJECTOR | Freq: Three times a day (TID) | SUBCUTANEOUS | 1 refills | Status: DC

## 2020-11-30 MED ORDER — CYCLOBENZAPRINE HCL 10 MG PO TABS
10.0000 mg | ORAL_TABLET | Freq: Three times a day (TID) | ORAL | 1 refills | Status: DC | PRN
Start: 2020-11-30 — End: 2021-01-16

## 2020-11-30 MED ORDER — AMLODIPINE BESYLATE 10 MG PO TABS
10.0000 mg | ORAL_TABLET | Freq: Every day | ORAL | 1 refills | Status: DC
Start: 2020-11-30 — End: 2020-12-05

## 2020-11-30 MED ORDER — NAPROXEN 500 MG PO TABS: 500.0000 mg | ORAL_TABLET | Freq: Two times a day (BID) | ORAL | 1 refills | Status: DC

## 2020-11-30 MED ORDER — TRULICITY 0.75 MG/0.5ML ~~LOC~~ SOAJ
0.7500 mg | SUBCUTANEOUS | 6 refills | Status: DC
Start: 2020-11-30 — End: 2020-12-05

## 2020-11-30 NOTE — Patient Instructions (Signed)
Dulaglutide Injection What is this medicine? DULAGLUTIDE (DOO la GLOO tide) controls blood sugar in people with type 2 diabetes. It is used with lifestyle changes like diet and exercise. It may lower the risk of problems that need treatment in the hospital. These problems include heart attack or stroke. This medicine may be used for other purposes; ask your health care provider or pharmacist if you have questions. COMMON BRAND NAME(S): Trulicity What should I tell my health care provider before I take this medicine? They need to know if you have any of these conditions:  endocrine tumors (MEN 2) or if someone in your family had these tumors  eye disease, vision problems  history of pancreatitis  kidney disease  liver disease  stomach or intestine problems  thyroid cancer or if someone in your family had thyroid cancer  an unusual or allergic reaction to dulaglutide, other medicines, foods, dyes, or preservatives  pregnant or trying to get pregnant  breast-feeding How should I use this medicine? This medicine is injected under the skin. You will be taught how to prepare and give it. Take it as directed on the prescription label on the same day of each week. Do NOT prime the pen. Keep taking it unless your health care provider tells you to stop. If you use this medicine with insulin, you should inject this medicine and the insulin separately. Do not mix them together. Do not give the injections right next to each other. Change (rotate) injection sites with each injection. This drug comes with INSTRUCTIONS FOR USE. Ask your pharmacist for directions on how to use this medicine. Read the information carefully. Talk to your pharmacist or health care provider if you have questions. It is important that you put your used needles and syringes in a special sharps container. Do not put them in a trash can. If you do not have a sharps container, call your pharmacist or health care provider to get  one. A special MedGuide will be given to you by the pharmacist with each prescription and refill. Be sure to read this information carefully each time. Talk to your health care provider about the use of this medicine in children. Special care may be needed. Overdosage: If you think you have taken too much of this medicine contact a poison control center or emergency room at once. NOTE: This medicine is only for you. Do not share this medicine with others. What if I miss a dose? If you miss a dose, take it as soon as you can unless it is more than 3 days late. If it is more than 3 days late, skip the missed dose. Take the next dose at the normal time. What may interact with this medicine?  other medicines for diabetes Many medications may cause changes in blood sugar, these include:  alcohol containing beverages  antiviral medicines for HIV or AIDS  aspirin and aspirin-like drugs  certain medicines for blood pressure, heart disease, irregular heart beat  chromium  diuretics  female hormones, such as estrogens or progestins, birth control pills  fenofibrate  gemfibrozil  isoniazid  lanreotide  female hormones or anabolic steroids  MAOIs like Carbex, Eldepryl, Marplan, Nardil, and Parnate  medicines for allergies, asthma, cold, or cough  medicines for depression, anxiety, or psychotic disturbances  medicines for weight loss  niacin  nicotine  NSAIDs, medicines for pain and inflammation, like ibuprofen or naproxen  octreotide  pasireotide  pentamidine  phenytoin  probenecid  quinolone antibiotics such as ciprofloxacin,   levofloxacin, ofloxacin  some herbal dietary supplements  steroid medicines such as prednisone or cortisone  sulfamethoxazole; trimethoprim  thyroid hormones Some medications can hide the warning symptoms of low blood sugar (hypoglycemia). You may need to monitor your blood sugar more closely if you are taking one of these medications.  These include:  beta-blockers, often used for high blood pressure or heart problems (examples include atenolol, metoprolol, propranolol)  clonidine  guanethidine  reserpine This list may not describe all possible interactions. Give your health care provider a list of all the medicines, herbs, non-prescription drugs, or dietary supplements you use. Also tell them if you smoke, drink alcohol, or use illegal drugs. Some items may interact with your medicine. What should I watch for while using this medicine? Visit your health care provider for regular checks on your progress. Check with your health care provider if you have severe diarrhea, nausea, and vomiting, or if you sweat a lot. The loss of too much body fluid may make it dangerous for you to take this medicine. A test called the HbA1C (A1C) will be monitored. This is a simple blood test. It measures your blood sugar control over the last 2 to 3 months. You will receive this test every 3 to 6 months. Learn how to check your blood sugar. Learn the symptoms of low and high blood sugar and how to manage them. Always carry a quick-source of sugar with you in case you have symptoms of low blood sugar. Examples include hard sugar candy or glucose tablets. Make sure others know that you can choke if you eat or drink when you develop serious symptoms of low blood sugar, such as seizures or unconsciousness. Get medical help at once. Tell your health care provider if you have high blood sugar. You might need to change the dose of your medicine. If you are sick or exercising more than usual, you may need to change the dose of your medicine. Do not skip meals. Ask your health care provider if you should avoid alcohol. Many nonprescription cough and cold products contain sugar or alcohol. These can affect blood sugar. Pens should never be shared. Even if the needle is changed, sharing may result in passing of viruses like hepatitis or HIV. Wear a medical ID  bracelet or chain. Carry a card that describes your condition. List the medicines and doses you take on the card. What side effects may I notice from receiving this medicine? Side effects that you should report to your doctor or health care professional as soon as possible:  allergic reactions (skin rash, itching or hives; swelling of the face, lips, or tongue)  changes in vision  diarrhea that continues or is severe  infection (fever, chills, cough, sore throat, pain or trouble passing urine)  kidney injury (trouble passing urine or change in the amount of urine)  low blood sugar (feeling anxious; confusion; dizziness; increased hunger; unusually weak or tired; increased sweating; shakiness; cold, clammy skin; irritable; headache; blurred vision; fast heartbeat; loss of consciousness)  lump or swelling on the neck  trouble breathing  trouble swallowing  unusual stomach upset or pain  vomiting Side effects that usually do not require medical attention (report to your doctor or health care professional if they continue or are bothersome):  lack or loss of appetite  nausea  pain, redness, or irritation at site where injected This list may not describe all possible side effects. Call your doctor for medical advice about side effects. You may report   side effects to FDA at 1-800-FDA-1088. Where should I keep my medicine? Keep out of the reach of children and pets. Refrigeration (preferred): Store unopened pens in a refrigerator between 2 and 8 degrees C (36 and 46 degrees F). Keep it in the original carton until you are ready to take it. Do not freeze or use if the medicine has been frozen. Protect from light. Get rid of any unused medicine after the expiration date on the label. Room Temperature: The pen may be stored at room temperature below 30 degrees C (86 degrees F) for up to a total of 14 days if needed. Protect from light. Avoid exposure to extreme heat. If it is stored at room  temperature, throw away any unused medicine after 14 days or after it expires, whichever is first. To get rid of medicines that are no longer needed or have expired:  Take the medicine to a medicine take-back program. Check with your pharmacy or law enforcement to find a location.  If you cannot return the medicine, ask your pharmacist or health care provider how to get rid of this medicine safely. NOTE: This sheet is a summary. It may not cover all possible information. If you have questions about this medicine, talk to your doctor, pharmacist, or health care provider.  2021 Elsevier/Gold Standard (2020-06-20 07:35:51)  

## 2020-11-30 NOTE — Progress Notes (Signed)
**Note Misty-Identified via Obfuscation** Subjective:  Patient ID: Misty [name], female    DOB: Aug 03, 1991  Age: 30 y.o. MRN: [id number]  CC: Diabetes   HPI Misty [name] is a 30 year old female who presents forFollow-up of diabetes. She continues to use Lantus 18 units from hospital discharge. Although previous visit changed to Trulicity due to her work schedule she missed the teaching.  Patient does  check blood sugar at home  Compliant with meds - No Checking CBGs? Yes   Fasting avg - 90- 200  Exercising regularly? - Yes (WALKING ) Watching carbohydrate intake? - Yes Neuropathy ? - No Hypoglycemic events - No  - Recovers with :   Pertinent ROS:  Polyuria - No Polydipsia - NO Vision problems - No  Medications as noted below. Taking them regularly without complication/adverse reaction being reported today.   History Misty [name] has a past medical history of Allergy, Anemia (2015), Asthma, Diabetes mellitus without complication ([place]) (3664), Eczema, and Hypertension (09/2013).   She has a past surgical history that includes Abcess on back.   Her family history includes Diabetes in her maternal grandfather, maternal grandmother, and mother; Healthy in her father; Heart disease in her maternal grandfather and maternal grandmother; Heart disease (age of onset: 35) in her mother; Hypertension in her maternal grandfather and mother; Sickle cell anemia in her brother.She reports that she quit smoking about 7 years ago. She has a 1.00 pack-year smoking history. She has never used smokeless tobacco. She reports that she does not drink alcohol and does not use drugs.  Current Outpatient Medications on File Prior to Visit  Medication Sig Dispense Refill  . blood glucose meter kit and supplies KIT Dispense based on patient and insurance preference. Use up to four times daily as directed. (FOR ICD-9 250.00, 250.01). 1 each 0  . blood glucose meter kit and supplies Dispense based on patient and insurance preference. Use up to  four times daily as directed. (FOR ICD-10 E10.9, E11.9). 1 each 0  . metoCLOPramide (REGLAN) 5 MG tablet Take 1 tablet (5 mg total) by mouth 3 (three) times daily before meals. 90 tablet 0  . [DISCONTINUED] cetirizine (ZYRTEC) 10 MG tablet Take 1 tablet (10 mg total) by mouth daily. (Patient not taking: Reported on 02/25/2020) 30 tablet 0  . [DISCONTINUED] dicyclomine (BENTYL) 20 MG tablet Take 1 tablet (20 mg total) by mouth 2 (two) times daily. 20 tablet 0  . [DISCONTINUED] fluticasone (FLONASE) 50 MCG/ACT nasal spray Place 2 sprays into both nostrils daily. 1 g 1  . [DISCONTINUED] ipratropium (ATROVENT) 0.06 % nasal spray Place 2 sprays into both nostrils 4 (four) times daily. 15 mL 1  . [DISCONTINUED] omeprazole (PRILOSEC) 20 MG capsule Take 1 capsule (20 mg total) by mouth daily. 14 capsule 0   No current facility-administered medications on file prior to visit.    ROS Review of Systems  Constitutional:       WEIGHT GAIN BUT WAS TRYING TO GET WEIGHT BACK FROM LOSING IN THE HOSPITAL   Gastrointestinal: Positive for nausea and vomiting.       1 week ago started n/v in morning and sensitive to smells   All other systems reviewed and are negative.   Objective:  BP 133/86 (BP Location: Right Arm, Patient Position: Sitting, Cuff Size: Large)   Pulse 79   Temp (!) 97.3 F (36.3 C) (Temporal)   Ht _0  (1.6 m)   Wt 184 lb 3.2 oz (83.6 kg)   SpO2 99%  BMI 32.63 kg/m   BP Readings from Last 3 Encounters:  11/30/20 133/86  10/12/20 134/75  10/05/20 133/81    Wt Readings from Last 3 Encounters:  11/30/20 184 lb 3.2 oz (83.6 kg)  09/28/20 173 lb 6.4 oz (78.7 kg)  08/27/20 180 lb (81.6 kg)    Physical Exam Vitals reviewed.  Constitutional:      Appearance: She is obese.  HENT:     Head: Normocephalic.     Right Ear: Tympanic membrane and external ear normal.     Left Ear: Tympanic membrane and external ear normal.     Nose: Nose normal.  Eyes:     Extraocular Movements:  Extraocular movements intact.  Cardiovascular:     Rate and Rhythm: Normal rate and regular rhythm.  Pulmonary:     Effort: Pulmonary effort is normal.     Breath sounds: Normal breath sounds.  Abdominal:     General: Bowel sounds are normal. There is distension.     Palpations: Abdomen is soft.  Musculoskeletal:        General: Normal range of motion.     Cervical back: Normal range of motion and neck supple.  Skin:    General: Skin is warm and dry.  Neurological:     Mental Status: She is alert and oriented to person, place, and time.  Psychiatric:        Mood and Affect: Mood normal.        Behavior: Behavior normal.        Thought Content: Thought content normal.        Judgment: Judgment normal.     Lab Results  Component Value Date   HGBA1C 9.1 (A) 11/30/2020   HGBA1C 11.4 (H) 09/02/2020   HGBA1C 9.5 (H) 03/19/2014    Lab Results  Component Value Date   WBC 4.8 10/12/2020   HGB 11.5 (L) 10/12/2020   HCT 35.4 (L) 10/12/2020   PLT 358 10/12/2020   GLUCOSE 197 (H) 10/12/2020   CHOL 150 11/06/2013   TRIG 43 11/06/2013   HDL 62 11/06/2013   LDLCALC 79 11/06/2013   ALT 16 10/12/2020   AST 16 10/12/2020   NA 134 (L) 10/12/2020   K 4.0 10/12/2020   CL 101 10/12/2020   CREATININE 0.75 10/12/2020   BUN 7 10/12/2020   CO2 26 10/12/2020   TSH 1.897 08/02/2014   HGBA1C 9.1 (A) 11/30/2020   MICROALBUR 0.50 12/11/2013     Assessment & Plan:   Misty [name] was seen today for diabetes.  Diagnoses and all orders for this visit:  Type 2 diabetes mellitus without complication, without long-term current use of insulin (HCC) -     HgB A1c improving at 9.1 previously 11 not at goal but striving to reach it. No hospitaztion for DM since last visit.  Goal of therapy: Less than 6.5 hemoglobin A1c.  Continue to decrease foods that are high in carbohydrates are the following rice, potatoes, breads, sugars, and pastas.  Reduction in the intake (eating) will assist in lowering your  blood sugars. -     Glucose (CBG) Misty [name] was seen today for diabetes.  Diagnoses and all orders for this visit:  Type 2 diabetes mellitus without complication, without long-term current use of insulin (HCC) -     HgB A1c -     Glucose (CBG) -     insulin aspart (NOVOLOG) 100 UNIT/ML FlexPen; Inject 12 Units into the skin 3 (three) times daily with meals. For blood  sugars 0-150 give 0 units of insulin, 151-200 give 0 units of insulin, 201-250 give 4 units, 251-300 give 6 units, 301-350 give 8 units, 351-400 give 10 units,> 400 give 12 units and call M.D. Discussed hypoglycemia protocol. -     Ambulatory referral to Ophthalmology -     Lipid Panel -     Dulaglutide (TRULICITY) 2.87 OM/7.6HM SOPN; Inject 0.75 mg into the skin once a week.  Amenorrhea -     POCT urine pregnancy Positive refer to GYN   Encounter for diabetes education Discussed  co- morbidities with uncontrol diabetes  Complications -diabetic retinopathy, (close your eyes ? What do you see nothing) nephropathy decrease in kidney function- can lead to dialysis-on a machine 3 days a week to filter your kidney, neuropathy- numbness and tinging in your hands and feet,  increase risk of heart attack and stroke, and amputation due to decrease wound healing and circulation. Decrease your risk by taking medication daily as prescribed, monitor carbohydrates- foods that are high in carbohydrates are the following rice, potatoes, breads, sugars, and pastas.  Reduction in the intake (eating) will assist in lowering your blood sugars. Exercise daily at least 30 minutes daily.  Hypertension, unspecified type Counseled on blood pressure goal of less than 130/80, low-sodium, DASH diet, medication compliance, 150 minutes of moderate intensity exercise per week. Discussed medication compliance, adverse effects.  Muscle spasm of back -     cyclobenzaprine (FLEXERIL) 10 MG tablet; Take 1 tablet (10 mg total) by mouth 3 (three) times daily as  needed for muscle spasms. -     CMP14+EGFR -     Magnesium Called patient to inform do not take flexeril or naproxen she can only use Tylenol until seen by GYN  Muscle spasm of both lower legs -     naproxen (NAPROSYN) 500 MG tablet; Take 1 tablet (500 mg total) by mouth 2 (two) times daily with a meal. -     cyclobenzaprine (FLEXERIL) 10 MG tablet; Take 1 tablet (10 mg total) by mouth 3 (three) times daily as needed for muscle spasms. Called patient to inform do not take flexeril or naproxen she can only use Tylenol until seen by GYN  Other orders -     amLODipine (NORVASC) 10 MG tablet; Take 1 tablet (10 mg total) by mouth daily. -     Insulin Pen Needle 31G X 5 MM MISC; 1 Device by Does not apply route 4 (four) times daily. For use with insulin pens    I have discontinued Misty [name]'s doxycycline and HYDROcodone-acetaminophen. I have also changed her amLODipine. Additionally, I am having her maintain her blood glucose meter kit and supplies, blood glucose meter kit and supplies, metoCLOPramide, insulin aspart, naproxen, Insulin Pen Needle, Trulicity, and cyclobenzaprine.  Meds ordered this encounter  Medications  . insulin aspart (NOVOLOG) 100 UNIT/ML FlexPen    Sig: Inject 12 Units into the skin 3 (three) times daily with meals. For blood sugars 0-150 give 0 units of insulin, 151-200 give 0 units of insulin, 201-250 give 4 units, 251-300 give 6 units, 301-350 give 8 units, 351-400 give 10 units,> 400 give 12 units and call M.D. Discussed hypoglycemia protocol.    Dispense:  15 mL    Refill:  1  . amLODipine (NORVASC) 10 MG tablet    Sig: Take 1 tablet (10 mg total) by mouth daily.    Dispense:  90 tablet    Refill:  1  . naproxen (NAPROSYN) 500 MG  tablet    Sig: Take 1 tablet (500 mg total) by mouth 2 (two) times daily with a meal.    Dispense:  60 tablet    Refill:  1  . Insulin Pen Needle 31G X 5 MM MISC    Sig: 1 Device by Does not apply route 4 (four) times daily. For  use with insulin pens    Dispense:  100 each    Refill:  11    For use with insulin pens  . Dulaglutide (TRULICITY) 0.35 WS/5.6CL SOPN    Sig: Inject 0.75 mg into the skin once a week.    Dispense:  0.5 mL    Refill:  6  . cyclobenzaprine (FLEXERIL) 10 MG tablet    Sig: Take 1 tablet (10 mg total) by mouth 3 (three) times daily as needed for muscle spasms.    Dispense:  60 tablet    Refill:  1     Follow-up:   Return in about 3 months (around 03/02/2021) for DM in person .  The above assessment and management plan was discussed with the patient. The patient verbalized understanding of and has agreed to the management plan. Patient is aware to call the clinic if symptoms fail to improve or worsen. Patient is aware when to return to the clinic for a follow-up visit. Patient educated on when it is appropriate to go to the emergency department.   Juluis Mire, NP-C

## 2020-12-01 ENCOUNTER — Encounter (INDEPENDENT_AMBULATORY_CARE_PROVIDER_SITE_OTHER): Payer: Self-pay | Admitting: Primary Care

## 2020-12-01 ENCOUNTER — Other Ambulatory Visit (INDEPENDENT_AMBULATORY_CARE_PROVIDER_SITE_OTHER): Payer: Self-pay | Admitting: Primary Care

## 2020-12-01 ENCOUNTER — Telehealth (INDEPENDENT_AMBULATORY_CARE_PROVIDER_SITE_OTHER): Payer: Self-pay

## 2020-12-01 ENCOUNTER — Ambulatory Visit (INDEPENDENT_AMBULATORY_CARE_PROVIDER_SITE_OTHER): Payer: 59 | Admitting: Primary Care

## 2020-12-01 DIAGNOSIS — E119 Type 2 diabetes mellitus without complications: Secondary | ICD-10-CM

## 2020-12-01 DIAGNOSIS — Z3201 Encounter for pregnancy test, result positive: Secondary | ICD-10-CM

## 2020-12-01 LAB — CMP14+EGFR
ALT: 13 IU/L (ref 0–32)
AST: 9 IU/L (ref 0–40)
Albumin/Globulin Ratio: 1.4 (ref 1.2–2.2)
Albumin: 4.1 g/dL (ref 3.9–5.0)
Alkaline Phosphatase: 80 IU/L (ref 44–121)
BUN/Creatinine Ratio: 16 (ref 9–23)
BUN: 10 mg/dL (ref 6–20)
Bilirubin Total: 0.3 mg/dL (ref 0.0–1.2)
CO2: 19 mmol/L — ABNORMAL LOW (ref 20–29)
Calcium: 8.8 mg/dL (ref 8.7–10.2)
Chloride: 98 mmol/L (ref 96–106)
Creatinine, Ser: 0.62 mg/dL (ref 0.57–1.00)
Globulin, Total: 2.9 g/dL (ref 1.5–4.5)
Glucose: 252 mg/dL — ABNORMAL HIGH (ref 65–99)
Potassium: 4 mmol/L (ref 3.5–5.2)
Sodium: 133 mmol/L — ABNORMAL LOW (ref 134–144)
Total Protein: 7 g/dL (ref 6.0–8.5)
eGFR: 123 mL/min/{1.73_m2} (ref >59)

## 2020-12-01 LAB — LIPID PANEL
Chol/HDL Ratio: 2 ratio (ref 0.0–4.4)
Cholesterol, Total: 147 mg/dL (ref 100–199)
HDL: 74 mg/dL (ref >39)
LDL Chol Calc (NIH): 63 mg/dL (ref 0–99)
Triglycerides: 46 mg/dL (ref 0–149)
VLDL Cholesterol Cal: 10 mg/dL (ref 5–40)

## 2020-12-01 LAB — MAGNESIUM: Magnesium: 1.7 mg/dL (ref 1.6–2.3)

## 2020-12-01 NOTE — Telephone Encounter (Signed)
Copied from CRM [id number]. Topic: General - Other >> Dec 01, 2020 11:25 AM Marylen Ponto wrote: Reason for CRM: Pt stated she had an appt yesterday and she was told she would need a paper for social services regarding her pregnancy. Pt requests return call from provider. >> Dec 01, 2020  1:56 PM Daphine Deutscher D wrote:     Pt called saying she needs this note asap for SS.  She wants to know if it can be sent to her email not her my chart.  [email or web address].   Please advice

## 2020-12-05 ENCOUNTER — Other Ambulatory Visit: Payer: Self-pay

## 2020-12-05 ENCOUNTER — Inpatient Hospital Stay (HOSPITAL_COMMUNITY): Payer: 59

## 2020-12-05 ENCOUNTER — Encounter (HOSPITAL_COMMUNITY): Payer: Self-pay | Admitting: Family Medicine

## 2020-12-05 ENCOUNTER — Inpatient Hospital Stay (HOSPITAL_COMMUNITY)
Admission: AD | Admit: 2020-12-05 | Discharge: 2020-12-05 | Disposition: A | Discharge status: Home / Self Care | Payer: 59 | Attending: Family Medicine | Admitting: Family Medicine

## 2020-12-05 DIAGNOSIS — O26891 Other specified pregnancy related conditions, first trimester: Secondary | ICD-10-CM | POA: Diagnosis present

## 2020-12-05 DIAGNOSIS — Z794 Long term (current) use of insulin: Secondary | ICD-10-CM | POA: Diagnosis not present

## 2020-12-05 DIAGNOSIS — R109 Unspecified abdominal pain: Secondary | ICD-10-CM | POA: Diagnosis not present

## 2020-12-05 DIAGNOSIS — E119 Type 2 diabetes mellitus without complications: Secondary | ICD-10-CM | POA: Diagnosis not present

## 2020-12-05 DIAGNOSIS — Z3491 Encounter for supervision of normal pregnancy, unspecified, first trimester: Secondary | ICD-10-CM

## 2020-12-05 DIAGNOSIS — Z79899 Other long term (current) drug therapy: Secondary | ICD-10-CM | POA: Insufficient documentation

## 2020-12-05 DIAGNOSIS — O26899 Other specified pregnancy related conditions, unspecified trimester: Secondary | ICD-10-CM

## 2020-12-05 DIAGNOSIS — Z87891 Personal history of nicotine dependence: Secondary | ICD-10-CM | POA: Diagnosis not present

## 2020-12-05 DIAGNOSIS — O24111 Pre-existing diabetes mellitus, type 2, in pregnancy, first trimester: Secondary | ICD-10-CM | POA: Insufficient documentation

## 2020-12-05 DIAGNOSIS — Z3A01 Less than 8 weeks gestation of pregnancy: Secondary | ICD-10-CM | POA: Insufficient documentation

## 2020-12-05 LAB — CBC
HCT: 34.4 % — ABNORMAL LOW (ref 36.0–46.0)
Hemoglobin: 11.6 g/dL — ABNORMAL LOW (ref 12.0–15.0)
MCH: 31 pg (ref 26.0–34.0)
MCHC: 33.7 g/dL (ref 30.0–36.0)
MCV: 92 fL (ref 80.0–100.0)
Platelets: 337 10*3/uL (ref 150–400)
RBC: 3.74 MIL/uL — ABNORMAL LOW (ref 3.87–5.11)
RDW: 12.7 % (ref 11.5–15.5)
WBC: 5.5 10*3/uL (ref 4.0–10.5)
nRBC: 0 % (ref 0.0–0.2)

## 2020-12-05 LAB — COMPREHENSIVE METABOLIC PANEL
ALT: 14 U/L (ref 0–44)
AST: 13 U/L — ABNORMAL LOW (ref 15–41)
Albumin: 3.8 g/dL (ref 3.5–5.0)
Alkaline Phosphatase: 68 U/L (ref 38–126)
Anion gap: 7 (ref 5–15)
BUN: 9 mg/dL (ref 6–20)
CO2: 26 mmol/L (ref 22–32)
Calcium: 9.3 mg/dL (ref 8.9–10.3)
Chloride: 99 mmol/L (ref 98–111)
Creatinine, Ser: 0.63 mg/dL (ref 0.44–1.00)
GFR, Estimated: >60 mL/min (ref >60)
Glucose, Bld: 211 mg/dL — ABNORMAL HIGH (ref 70–99)
Potassium: 3.7 mmol/L (ref 3.5–5.1)
Sodium: 132 mmol/L — ABNORMAL LOW (ref 135–145)
Total Bilirubin: 0.3 mg/dL (ref 0.3–1.2)
Total Protein: 7.1 g/dL (ref 6.5–8.1)

## 2020-12-05 LAB — URINALYSIS, ROUTINE W REFLEX MICROSCOPIC
Bacteria, UA: NONE SEEN
Bilirubin Urine: NEGATIVE
Glucose, UA: >=500 mg/dL — AB
Hgb urine dipstick: NEGATIVE
Ketones, ur: 5 mg/dL — AB
Leukocytes,Ua: NEGATIVE
Nitrite: NEGATIVE
Protein, ur: NEGATIVE mg/dL
Specific Gravity, Urine: 1.021 (ref 1.005–1.030)
pH: 5 (ref 5.0–8.0)

## 2020-12-05 LAB — ABO/RH: ABO/RH(D): O POS

## 2020-12-05 LAB — HCG, QUANTITATIVE, PREGNANCY: hCG, Beta Chain, Quant, S: 41886 m[IU]/mL — ABNORMAL HIGH (ref <5)

## 2020-12-05 MED ORDER — LABETALOL HCL 200 MG PO TABS: 200.0000 mg | ORAL_TABLET | Freq: Two times a day (BID) | ORAL | 1 refills | Status: DC

## 2020-12-05 MED ORDER — INSULIN ASPART 100 UNIT/ML FLEXPEN: 20.0000 [IU] | PEN_INJECTOR | Freq: Three times a day (TID) | SUBCUTANEOUS | 1 refills | Status: DC

## 2020-12-05 NOTE — MAU Note (Signed)
Was told to stop her BP medicine because they are going to change it.

## 2020-12-05 NOTE — MAU Provider Note (Addendum)
Patient Misty [name] is a 30 y.o. G2P0010  at [redacted]w[redacted]d here with complaints of abdominal pain that started this morning at 11 am. She denies vaginal bleeding, vaginal discharge, fever, diarrhea.   She endorses some nausea.   She has had a miscarriage in the past. She has Type 2 Diabetes, has been taking trulicty and lantus 18 units. See notes from Diabetic appt on 11/30/2020 for most recent regimen.   On EMS truck blood pressure was 150/100> she has recently stopped taking her amlodipine.   On EMS truck her blood sugar was 230.   She feels fine overall right now.   History     CSN: [id number]  Arrival date and time: 12/05/20 1752   Chief Complaint  Patient presents with  . Abdominal Pain   Abdominal Pain This is a new problem. The current episode started today. The problem occurs intermittently. The pain is located in the suprapubic region. The pain is at a severity of 8/10. The quality of the pain is cramping. The abdominal pain does not radiate. Pertinent negatives include no belching, constipation, diarrhea, dysuria, fever, headaches, nausea or vomiting. Nothing aggravates the pain. The pain is relieved by nothing.    OB History    Gravida  2   Para  0   Term  0   Preterm  0   AB  1   Living  0     SAB  1   IAB  0   Ectopic  0   Multiple  0   Live Births              Past Medical History:  Diagnosis Date  . Allergy   . Anemia 2015  . Asthma    as child  . Diabetes mellitus without complication ([place]) 9323  . Eczema   . Hypertension 09/2013    Past Surgical History:  Procedure Laterality Date  . Abcess on back      Family History  Problem Relation Age of Onset  . Hypertension Mother   . Heart disease Mother 65       CHF  . Diabetes Mother   . Sickle cell anemia Brother   . Hypertension Maternal Grandfather   . Diabetes Maternal Grandfather   . Heart disease Maternal Grandfather   . Healthy Father   . Diabetes Maternal Grandmother   .  Heart disease Maternal Grandmother     Social History   Tobacco Use  . Smoking status: Former Smoker    Packs/day: 0.50    Years: 2.00    Pack years: 1.00    Quit date: 09/08/2013    Years since quitting: 7.2  . Smokeless tobacco: Never Used  Vaping Use  . Vaping Use: Never used  Substance Use Topics  . Alcohol use: No    Alcohol/week: 0.0 standard drinks  . Drug use: No    Allergies:  Allergies  Allergen Reactions  . Iodides Anaphylaxis and Itching  . Morphine And Related Anaphylaxis and Itching  . Shellfish Allergy Anaphylaxis and Itching    Pt reports "watery eyes"  . Ultram [Tramadol Hcl] Hives    Hives and swollen lips     Medications Prior to Admission  Medication Sig Dispense Refill Last Dose  . amLODipine (NORVASC) 10 MG tablet Take 1 tablet (10 mg total) by mouth daily. 90 tablet 1   . blood glucose meter kit and supplies KIT Dispense based on patient and insurance preference. Use up to four times  daily as directed. (FOR ICD-9 250.00, 250.01). 1 each 0   . blood glucose meter kit and supplies Dispense based on patient and insurance preference. Use up to four times daily as directed. (FOR ICD-10 E10.9, E11.9). 1 each 0   . cyclobenzaprine (FLEXERIL) 10 MG tablet Take 1 tablet (10 mg total) by mouth 3 (three) times daily as needed for muscle spasms. 60 tablet 1   . Dulaglutide (TRULICITY) 4.16 SA/6.3KZ SOPN Inject 0.75 mg into the skin once a week. 0.5 mL 6   . insulin aspart (NOVOLOG) 100 UNIT/ML FlexPen Inject 12 Units into the skin 3 (three) times daily with meals. For blood sugars 0-150 give 0 units of insulin, 151-200 give 0 units of insulin, 201-250 give 4 units, 251-300 give 6 units, 301-350 give 8 units, 351-400 give 10 units,> 400 give 12 units and call M.D. Discussed hypoglycemia protocol. 15 mL 1   . Insulin Pen Needle 31G X 5 MM MISC 1 Device by Does not apply route 4 (four) times daily. For use with insulin pens 100 each 11   . metoCLOPramide (REGLAN) 5 MG  tablet Take 1 tablet (5 mg total) by mouth 3 (three) times daily before meals. 90 tablet 0   . naproxen (NAPROSYN) 500 MG tablet Take 1 tablet (500 mg total) by mouth 2 (two) times daily with a meal. 60 tablet 1   . sulfamethoxazole-trimethoprim (BACTRIM DS) 800-160 MG tablet TAKE 1 TABLET BY MOUTH TWO TIMES DAILY FOR 4 DAYS. 8 tablet 0     Review of Systems  Constitutional: Negative.  Negative for fatigue and fever.  HENT: Negative.   Respiratory: Negative.  Negative for shortness of breath.   Cardiovascular: Negative.  Negative for chest pain.  Gastrointestinal: Positive for abdominal pain. Negative for constipation, diarrhea, nausea and vomiting.  Genitourinary: Negative.  Negative for dysuria, vaginal bleeding and vaginal discharge.  Musculoskeletal: Negative.   Neurological: Negative.  Negative for dizziness and headaches.  Psychiatric/Behavioral: Negative.    Physical Exam   Blood pressure 131/76, pulse 73, temperature 98.6 F (37 C), temperature source Oral, resp. rate 18, height $RemoveBe'5\' 3"'fhplZuXFS$  (1.6 m), weight 83.5 kg, last menstrual period 10/14/2020, SpO2 100 %.  Physical Exam Vitals and nursing note reviewed.  Constitutional:      General: She is not in acute distress.    Appearance: She is well-developed.  HENT:     Head: Normocephalic.  Eyes:     Pupils: Pupils are equal, round, and reactive to light.  Cardiovascular:     Rate and Rhythm: Normal rate and regular rhythm.     Heart sounds: Normal heart sounds.  Pulmonary:     Effort: Pulmonary effort is normal. No respiratory distress.     Breath sounds: Normal breath sounds.  Abdominal:     General: Bowel sounds are normal. There is no distension.     Palpations: Abdomen is soft.     Tenderness: There is no abdominal tenderness.  Skin:    General: Skin is warm and dry.  Neurological:     Mental Status: She is alert and oriented to person, place, and time.  Psychiatric:        Behavior: Behavior normal.        Thought  Content: Thought content normal.        Judgment: Judgment normal.     MAU Course  Procedures Results for orders placed or performed during the hospital encounter of 12/05/20 (from the past 24 hour(s))  CBC  Status: Abnormal   Collection Time: 12/05/20  6:23 PM  Result Value Ref Range   WBC 5.5 4.0 - 10.5 K/uL   RBC 3.74 (L) 3.87 - 5.11 MIL/uL   Hemoglobin 11.6 (L) 12.0 - 15.0 g/dL   HCT 34.4 (L) 36.0 - 46.0 %   MCV 92.0 80.0 - 100.0 fL   MCH 31.0 26.0 - 34.0 pg   MCHC 33.7 30.0 - 36.0 g/dL   RDW 12.7 11.5 - 15.5 %   Platelets 337 150 - 400 K/uL   nRBC 0.0 0.0 - 0.2 %  hCG, quantitative, pregnancy     Status: Abnormal   Collection Time: 12/05/20  6:23 PM  Result Value Ref Range   hCG, Beta Chain, Quant, S 41,886 (H) <5 mIU/mL  ABO/Rh     Status: None   Collection Time: 12/05/20  6:23 PM  Result Value Ref Range   ABO/RH(D) O POS    No rh immune globuloin      NOT A RH IMMUNE GLOBULIN CANDIDATE, PT RH POSITIVE Performed at [facility], [place] [address], [place], [place] [address]   Comprehensive metabolic panel     Status: Abnormal   Collection Time: 12/05/20  6:23 PM  Result Value Ref Range   Sodium 132 (L) 135 - 145 mmol/L   Potassium 3.7 3.5 - 5.1 mmol/L   Chloride 99 98 - 111 mmol/L   CO2 26 22 - 32 mmol/L   Glucose, Bld 211 (H) 70 - 99 mg/dL   BUN 9 6 - 20 mg/dL   Creatinine, Ser 0.63 0.44 - 1.00 mg/dL   Calcium 9.3 8.9 - 10.3 mg/dL   Total Protein 7.1 6.5 - 8.1 g/dL   Albumin 3.8 3.5 - 5.0 g/dL   AST 13 (L) 15 - 41 U/L   ALT 14 0 - 44 U/L   Alkaline Phosphatase 68 38 - 126 U/L   Total Bilirubin 0.3 0.3 - 1.2 mg/dL   GFR, Estimated >60 >60 mL/min   Anion gap 7 5 - 15  Urinalysis, Routine w reflex microscopic Urine, Clean Catch     Status: Abnormal   Collection Time: 12/05/20  7:20 PM  Result Value Ref Range   Color, Urine YELLOW YELLOW   APPearance HAZY (A) CLEAR   Specific Gravity, Urine 1.021 1.005 - 1.030   pH 5.0 5.0 - 8.0   Glucose, UA >=500  (A) NEGATIVE mg/dL   Hgb urine dipstick NEGATIVE NEGATIVE   Bilirubin Urine NEGATIVE NEGATIVE   Ketones, ur 5 (A) NEGATIVE mg/dL   Protein, ur NEGATIVE NEGATIVE mg/dL   Nitrite NEGATIVE NEGATIVE   Leukocytes,Ua NEGATIVE NEGATIVE   RBC / HPF 0-5 0 - 5 RBC/hpf   WBC, UA 0-5 0 - 5 WBC/hpf   Bacteria, UA NONE SEEN NONE SEEN   Squamous Epithelial / LPF 6-10 0 - 5   US OB LESS THAN 14 WEEKS WITH OB TRANSVAGINAL  Result Date: 12/05/2020 CLINICAL DATA:  Lower abdominal pain EXAM: OBSTETRIC <14 WK [place] AND TRANSVAGINAL OB US TECHNIQUE: Both transabdominal and transvaginal ultrasound examinations were performed for complete evaluation of the gestation as well as the maternal uterus, adnexal regions, and pelvic cul-de-sac. Transvaginal technique was performed to assess early pregnancy. COMPARISON:  None. FINDINGS: Intrauterine gestational sac: Single Yolk sac:  Visualized. Embryo:  Visualized. Cardiac Activity: Visualized. Heart Rate: 115 bpm MSD:   mm    w     d CRL:  3.4 mm   5 w  6 d                  [place] EDC: 08/01/2021 Subchorionic hemorrhage:  None visualized. Maternal uterus/adnexae: No adnexal mass or free fluid. IMPRESSION: Five week 6 day intrauterine pregnancy. Fetal heart rate 150 beats per minute. No acute maternal findings. Electronically Signed   By: Rolm Baptise M.D.   On: 12/05/2020 19:26   MDM\ UA, UPT CBC, HCG ABO/Rh- O Pos Wet prep and gc/chlamydia- declined US OB Comp Less 14 weeks with Transvaginal  Consulted with Dr. Nehemiah Settle for medication management- recommends stopping trulicity, adjusting insulin to 20 units and starting labetalol 200mg  BID. Discussed with patient and patient agreeable to plan of care.  Assessment and Plan   1. Normal intrauterine pregnancy on prenatal ultrasound in first trimester   2. Abdominal pain affecting pregnancy   3. [redacted] weeks gestation of pregnancy   4. Type 2 diabetes mellitus without complication, with long-term current use of insulin ([place])   5.  Type 2 diabetes mellitus without complication, without long-term current use of insulin ([place])    -Discharge home in stable condition -Rx for labetalol and updated insulin prescription sent to pharmacy -First trimester precautions discussed -Patient advised to follow-up with OB to establish prenatal care -Message sent to [facility] to transfer patient to [facility] -Patient may return to MAU as needed or if her condition were to change or worsen  Wende Mott, CNM 12/05/20 9:06 PM

## 2020-12-05 NOTE — MAU Note (Signed)
Pt arrived by EMS,is [redacted] wks pregnant and is having stomach pain.  preg confirmed at [facility]. Pain started around 11 this morning. Cramping. No bleeding.  (hx of HTN 150/100 and diabetic BS 230 per EMS report)

## 2020-12-05 NOTE — Discharge Instructions (Signed)

## 2020-12-06 ENCOUNTER — Other Ambulatory Visit: Payer: Self-pay | Admitting: Obstetrics and Gynecology

## 2020-12-06 ENCOUNTER — Telehealth: Payer: Self-pay | Admitting: *Deleted

## 2020-12-06 DIAGNOSIS — E119 Type 2 diabetes mellitus without complications: Secondary | ICD-10-CM

## 2020-12-06 MED ORDER — INSULIN ASPART 100 UNIT/ML FLEXPEN: 20.0000 [IU] | PEN_INJECTOR | Freq: Three times a day (TID) | SUBCUTANEOUS | 1 refills | Status: DC

## 2020-12-06 NOTE — Telephone Encounter (Signed)
Received a voicemail from [place] at  [facility] stating they received RX for [employer] and wanted to verify directions. States it states give 20 units with meals TID and then has sliding scale stating give 0 units of insulin for blood sugar 0-150, etc. Wanted to clarify if that is correct or should be 2 units?  Per chart review was seen in MAU and will be a new patient at [facility]. I called MAU to discuss with Dr. Myriam Jacobson and she is changing order to read give 2 units for blood sugar 151-200; otherwise same orders for all other blood sugar readings. . I called [facility] and spoke with Almira Coaster and notified her. LindaRN

## 2020-12-08 ENCOUNTER — Encounter (HOSPITAL_COMMUNITY): Payer: Self-pay | Admitting: Obstetrics & Gynecology

## 2020-12-08 ENCOUNTER — Other Ambulatory Visit: Payer: Self-pay

## 2020-12-08 ENCOUNTER — Inpatient Hospital Stay (HOSPITAL_COMMUNITY)
Admission: EM | Admit: 2020-12-08 | Discharge: 2020-12-08 | Disposition: A | Discharge status: Home / Self Care | Payer: 59 | Attending: Obstetrics & Gynecology | Admitting: Obstetrics & Gynecology

## 2020-12-08 ENCOUNTER — Ambulatory Visit (INDEPENDENT_AMBULATORY_CARE_PROVIDER_SITE_OTHER): Payer: 59 | Admitting: Primary Care

## 2020-12-08 DIAGNOSIS — O24111 Pre-existing diabetes mellitus, type 2, in pregnancy, first trimester: Secondary | ICD-10-CM | POA: Insufficient documentation

## 2020-12-08 DIAGNOSIS — Z885 Allergy status to narcotic agent status: Secondary | ICD-10-CM | POA: Diagnosis not present

## 2020-12-08 DIAGNOSIS — Z87891 Personal history of nicotine dependence: Secondary | ICD-10-CM | POA: Diagnosis not present

## 2020-12-08 DIAGNOSIS — E119 Type 2 diabetes mellitus without complications: Secondary | ICD-10-CM | POA: Diagnosis not present

## 2020-12-08 DIAGNOSIS — O210 Mild hyperemesis gravidarum: Secondary | ICD-10-CM | POA: Insufficient documentation

## 2020-12-08 DIAGNOSIS — Z794 Long term (current) use of insulin: Secondary | ICD-10-CM | POA: Insufficient documentation

## 2020-12-08 DIAGNOSIS — Z3A01 Less than 8 weeks gestation of pregnancy: Secondary | ICD-10-CM | POA: Insufficient documentation

## 2020-12-08 DIAGNOSIS — O219 Vomiting of pregnancy, unspecified: Secondary | ICD-10-CM

## 2020-12-08 DIAGNOSIS — O10011 Pre-existing essential hypertension complicating pregnancy, first trimester: Secondary | ICD-10-CM

## 2020-12-08 DIAGNOSIS — O10919 Unspecified pre-existing hypertension complicating pregnancy, unspecified trimester: Secondary | ICD-10-CM

## 2020-12-08 LAB — COMPREHENSIVE METABOLIC PANEL
ALT: 15 U/L (ref 0–44)
AST: 15 U/L (ref 15–41)
Albumin: 3.7 g/dL (ref 3.5–5.0)
Alkaline Phosphatase: 61 U/L (ref 38–126)
Anion gap: 5 (ref 5–15)
BUN: <5 mg/dL — ABNORMAL LOW (ref 6–20)
CO2: 24 mmol/L (ref 22–32)
Calcium: 9.1 mg/dL (ref 8.9–10.3)
Chloride: 103 mmol/L (ref 98–111)
Creatinine, Ser: 0.58 mg/dL (ref 0.44–1.00)
GFR, Estimated: >60 mL/min (ref >60)
Glucose, Bld: 186 mg/dL — ABNORMAL HIGH (ref 70–99)
Potassium: 3.8 mmol/L (ref 3.5–5.1)
Sodium: 132 mmol/L — ABNORMAL LOW (ref 135–145)
Total Bilirubin: 0.5 mg/dL (ref 0.3–1.2)
Total Protein: 6.9 g/dL (ref 6.5–8.1)

## 2020-12-08 LAB — CBC
HCT: 32.9 % — ABNORMAL LOW (ref 36.0–46.0)
Hemoglobin: 11.4 g/dL — ABNORMAL LOW (ref 12.0–15.0)
MCH: 32.1 pg (ref 26.0–34.0)
MCHC: 34.7 g/dL (ref 30.0–36.0)
MCV: 92.7 fL (ref 80.0–100.0)
Platelets: 333 10*3/uL (ref 150–400)
RBC: 3.55 MIL/uL — ABNORMAL LOW (ref 3.87–5.11)
RDW: 12.5 % (ref 11.5–15.5)
WBC: 6.6 10*3/uL (ref 4.0–10.5)
nRBC: 0 % (ref 0.0–0.2)

## 2020-12-08 LAB — URINALYSIS, ROUTINE W REFLEX MICROSCOPIC
Bacteria, UA: NONE SEEN
Bilirubin Urine: NEGATIVE
Glucose, UA: >=500 mg/dL — AB
Hgb urine dipstick: NEGATIVE
Ketones, ur: NEGATIVE mg/dL
Leukocytes,Ua: NEGATIVE
Nitrite: NEGATIVE
Protein, ur: NEGATIVE mg/dL
Specific Gravity, Urine: 1.01 (ref 1.005–1.030)
pH: 6 (ref 5.0–8.0)

## 2020-12-08 LAB — GLUCOSE, CAPILLARY: Glucose-Capillary: 164 mg/dL — ABNORMAL HIGH (ref 70–99)

## 2020-12-08 MED ORDER — PROMETHAZINE HCL 25 MG PO TABS
25.0000 mg | ORAL_TABLET | Freq: Four times a day (QID) | ORAL | Status: DC | PRN
  Administered 2020-12-08: 25 mg via ORAL
  Filled 2020-12-08: qty 1

## 2020-12-08 MED ORDER — PROMETHAZINE HCL 25 MG PO TABS: 12.5000 mg | ORAL_TABLET | Freq: Four times a day (QID) | ORAL | 0 refills | Status: DC | PRN

## 2020-12-08 NOTE — MAU Note (Signed)
Since Monday pt had been having n/v. Seen in mau and was not given anything for nausea. Able to keep ginger and ice down.  C/O abd pain and cramping. Denies any vag bleeding or discharge.

## 2020-12-08 NOTE — MAU Provider Note (Signed)
Chief Complaint: Emesis   None     SUBJECTIVE HPI: Misty [name] is a 30 y.o. G2P0010 at [redacted]w[redacted]d by early ultrasound with history of type II diabetes and hypertension who presents to maternity admissions reporting nausea, vomiting and abdominal pain. She reports NV beginning 4 days ago that has remained constant. She was seen in MAU on 4/4 and was not given anything for nausea. She describes mid abdominal pain that comes and goes, occurring every 3-4 hours. She rates pain a 8/10 at its worst and has no pain between episodes. Pain is worse with eating but has found gingerale and ice to be helpful. Pain improved with rest and time. No sick contacts. Denies dysuria, polyuria, constipation, diarrhea, or back pain. Denies GERD sx. Denies chest pain, SOB or palpitations. She denies vaginal bleeding, LOF, vaginal itching/burning, urinary symptoms, h/a, dizziness, n/v, or fever/chills.    Of note, she was recently switched to labetalol for BP control and trulicty was removed from regimen. She is taking insulin only once a day when she returns from work. Per chart review, she is prescribed 20 units TID with meals.  Past Medical History:  Diagnosis Date  . Allergy   . Anemia 2015  . Asthma    as child  . Diabetes mellitus without complication ([place]) 0258  . Eczema   . Hypertension 09/2013   Past Surgical History:  Procedure Laterality Date  . Abcess on back     Social History   Socioeconomic History  . Marital status: Significant Other    Spouse name: Not on file  . Number of children: Not on file  . Years of education: Not on file  . Highest education level: Not on file  Occupational History  . Not on file  Tobacco Use  . Smoking status: Former Smoker    Packs/day: 0.50    Years: 2.00    Pack years: 1.00    Quit date: 09/08/2013    Years since quitting: 7.2  . Smokeless tobacco: Never Used  Vaping Use  . Vaping Use: Never used  Substance and Sexual Activity  . Alcohol use: No     Alcohol/week: 0.0 standard drinks  . Drug use: No  . Sexual activity: Yes    Birth control/protection: None    Comment: 1st intercourse 20 yo--1 partner ( has been same partner 3 yrs)   Other Topics Concern  . Not on file  Social History Narrative   Works at [employer] Strain: Not on [employer] Insecurity: Not on file  Transportation Needs: Not on file  Physical Activity: Not on file  Stress: Not on file  Social Connections: Not on file  Intimate Partner Violence: Not on file   No current facility-administered medications on file prior to encounter.   Current Outpatient Medications on File Prior to Encounter  Medication Sig Dispense Refill  . blood glucose meter kit and supplies KIT Dispense based on patient and insurance preference. Use up to four times daily as directed. (FOR ICD-9 250.00, 250.01). 1 each 0  . blood glucose meter kit and supplies Dispense based on patient and insurance preference. Use up to four times daily as directed. (FOR ICD-10 E10.9, E11.9). 1 each 0  . cyclobenzaprine (FLEXERIL) 10 MG tablet Take 1 tablet (10 mg total) by mouth 3 (three) times daily as needed for muscle spasms. 60 tablet 1  . insulin aspart (NOVOLOG) 100 UNIT/ML FlexPen Inject 20 Units  into the skin 3 (three) times daily with meals. For blood sugars 0-150 give 0 units of insulin, 151-200 give 2 units of insulin, 201-250 give 4 units, 251-300 give 6 units, 301-350 give 8 units, 351-400 give 10 units,> 400 give 12 units and call M.D. Discussed hypoglycemia protocol. 15 mL 1  . Insulin Pen Needle 31G X 5 MM MISC 1 Device by Does not apply route 4 (four) times daily. For use with insulin pens 100 each 11  . labetalol (NORMODYNE) 200 MG tablet Take 1 tablet (200 mg total) by mouth 2 (two) times daily. 60 tablet 1  . [DISCONTINUED] cetirizine (ZYRTEC) 10 MG tablet Take 1 tablet (10 mg total) by mouth daily. (Patient not taking: Reported on 02/25/2020)  30 tablet 0  . [DISCONTINUED] dicyclomine (BENTYL) 20 MG tablet Take 1 tablet (20 mg total) by mouth 2 (two) times daily. 20 tablet 0  . [DISCONTINUED] fluticasone (FLONASE) 50 MCG/ACT nasal spray Place 2 sprays into both nostrils daily. 1 g 1  . [DISCONTINUED] ipratropium (ATROVENT) 0.06 % nasal spray Place 2 sprays into both nostrils 4 (four) times daily. 15 mL 1  . [DISCONTINUED] omeprazole (PRILOSEC) 20 MG capsule Take 1 capsule (20 mg total) by mouth daily. 14 capsule 0   Allergies  Allergen Reactions  . Iodides Anaphylaxis and Itching  . Morphine And Related Anaphylaxis and Itching  . Shellfish Allergy Anaphylaxis and Itching    Pt reports "watery eyes"  . Ultram [Tramadol Hcl] Hives    Hives and swollen lips     ROS:  Review of Systems  Gastrointestinal: Positive for abdominal pain, nausea and vomiting.  All other systems reviewed and are negative.    I have reviewed patient's Past Medical Hx, Surgical Hx, Family Hx, Social Hx, medications and allergies.   Physical Exam   Patient Vitals for the past 24 hrs:  BP Temp Pulse Resp SpO2 Height Weight  12/08/20 1924 (!) 151/82 -- 80 15 100 % -- --  12/08/20 1524 131/80 98.5 F (36.9 C) 80 18 -- $Rem'5\' 3"'tVIq$  (1.6 m) 84.8 kg  12/08/20 1332 126/83 98.5 F (36.9 C) 65 16 99 % -- --   Constitutional: Well-developed, well-nourished female in no acute distress.  Cardiovascular: normal rate Respiratory: normal effort GI: Abd soft, non-tender. Pos BS x 4 MS: Extremities nontender, no edema, normal ROM Neurologic: Alert and oriented x 4.    LAB RESULTS Results for orders placed or performed during the hospital encounter of 12/08/20 (from the past 24 hour(s))  Urinalysis, Routine w reflex microscopic Urine, Clean Catch     Status: Abnormal   Collection Time: 12/08/20  3:26 PM  Result Value Ref Range   Color, Urine YELLOW YELLOW   APPearance CLEAR CLEAR   Specific Gravity, Urine 1.010 1.005 - 1.030   pH 6.0 5.0 - 8.0   Glucose, UA  >=500 (A) NEGATIVE mg/dL   Hgb urine dipstick NEGATIVE NEGATIVE   Bilirubin Urine NEGATIVE NEGATIVE   Ketones, ur NEGATIVE NEGATIVE mg/dL   Protein, ur NEGATIVE NEGATIVE mg/dL   Nitrite NEGATIVE NEGATIVE   Leukocytes,Ua NEGATIVE NEGATIVE   RBC / HPF 0-5 0 - 5 RBC/hpf   WBC, UA 0-5 0 - 5 WBC/hpf   Bacteria, UA NONE SEEN NONE SEEN   Squamous Epithelial / LPF 0-5 0 - 5  Glucose, capillary     Status: Abnormal   Collection Time: 12/08/20  4:52 PM  Result Value Ref Range   Glucose-Capillary 164 (H) 70 - 99 mg/dL  Comprehensive metabolic panel Once     Status: Abnormal   Collection Time: 12/08/20  5:39 PM  Result Value Ref Range   Sodium 132 (L) 135 - 145 mmol/L   Potassium 3.8 3.5 - 5.1 mmol/L   Chloride 103 98 - 111 mmol/L   CO2 24 22 - 32 mmol/L   Glucose, Bld 186 (H) 70 - 99 mg/dL   BUN <5 (L) 6 - 20 mg/dL   Creatinine, Ser 0.58 0.44 - 1.00 mg/dL   Calcium 9.1 8.9 - 10.3 mg/dL   Total Protein 6.9 6.5 - 8.1 g/dL   Albumin 3.7 3.5 - 5.0 g/dL   AST 15 15 - 41 U/L   ALT 15 0 - 44 U/L   Alkaline Phosphatase 61 38 - 126 U/L   Total Bilirubin 0.5 0.3 - 1.2 mg/dL   GFR, Estimated >60 >60 mL/min   Anion gap 5 5 - 15  CBC     Status: Abnormal   Collection Time: 12/08/20  5:39 PM  Result Value Ref Range   WBC 6.6 4.0 - 10.5 K/uL   RBC 3.55 (L) 3.87 - 5.11 MIL/uL   Hemoglobin 11.4 (L) 12.0 - 15.0 g/dL   HCT 32.9 (L) 36.0 - 46.0 %   MCV 92.7 80.0 - 100.0 fL   MCH 32.1 26.0 - 34.0 pg   MCHC 34.7 30.0 - 36.0 g/dL   RDW 12.5 11.5 - 15.5 %   Platelets 333 150 - 400 K/uL   nRBC 0.0 0.0 - 0.2 %    --/--/O POS (04/04 1823)  IMAGING US OB LESS THAN 14 WEEKS WITH OB TRANSVAGINAL  Result Date: 12/05/2020 CLINICAL DATA:  Lower abdominal pain EXAM: OBSTETRIC <14 WK [place] AND TRANSVAGINAL OB US TECHNIQUE: Both transabdominal and transvaginal ultrasound examinations were performed for complete evaluation of the gestation as well as the maternal uterus, adnexal regions, and pelvic cul-de-sac.  Transvaginal technique was performed to assess early pregnancy. COMPARISON:  None. FINDINGS: Intrauterine gestational sac: Single Yolk sac:  Visualized. Embryo:  Visualized. Cardiac Activity: Visualized. Heart Rate: 115 bpm MSD:   mm    w     d CRL:  3.4 mm   5 w   6 d                  [place] EDC: 08/01/2021 Subchorionic hemorrhage:  None visualized. Maternal uterus/adnexae: No adnexal mass or free fluid. IMPRESSION: Five week 6 day intrauterine pregnancy. Fetal heart rate 150 beats per minute. No acute maternal findings. Electronically Signed   By: Rolm Baptise M.D.   On: 12/05/2020 19:26    MAU Management/MDM: Orders Placed This Encounter  Procedures  . Urinalysis, Routine w reflex microscopic Urine, Clean Catch  . Comprehensive metabolic panel Once  . Glucose, capillary  . CBC  . Discharge patient    Meds ordered this encounter  Medications  . promethazine (PHENERGAN) tablet 25 mg  . promethazine (PHENERGAN) 25 MG tablet    Sig: Take 0.5-1 tablets (12.5-25 mg total) by mouth every 6 (six) hours as needed for nausea or vomiting.    Dispense:  30 tablet    Refill:  0    Order Specific Question:   Supervising Provider    Answer:   Janyth Pupa [[id number]]   Treatments in MAU included Reglan for nausea with improvement. Pt discharged with follow-up in high risk clinic.   ASSESSMENT 1. [redacted] weeks gestation of pregnancy   2. Morning sickness   3. Pregnancy with type  2 diabetes mellitus in first trimester     This is a 30 year old female G2P0010 at [facility] who presents for nausea, vomiting and abdominal pain. Differential includes N/V of pregnancy, DKA, gastroenteritis vs others. She is well-appearing and well-hydrated. Abdomen is non-acute. Recent US showed IUP with FHR of 159 bpm. UA today showed >500 glucose. BG was 186 with normal CO2 therefore do not suspect DKA. WBC within normal limits so do not suspect infectious process. This is likely normal nausea and vomiting associated with first  trimester pregnancy. She was given Reglan in the MAU with improvement and will be sent home with same.  Regarding her diabetes, she is not taking insulin as prescribed so we discussed how to take this at home to achieve good glycemic control. We discussed the risks of poor glycemic control and potential effects on the pregnancy. She follows with our high risk clinic to monitor throughout pregnancy. We discussed the signs and symptoms for which to return such as persistent vomiting, lightheadedness, confusion, and severe pain.   PLAN Discharge home  1. N/V and abdominal pain Phenergan 25 mg q6 for nausea as needed Discussed ginger and small snacks every few hours  2. Diabetes Type II Discussed how to take insulin at home  Continue home regimen as prescribed.   3. HTN  Continue labetalol   4. High Risk Pregnancy, ~8wk  F/u with [facility] for high risk   Allergies as of 12/08/2020      Reactions   Iodides Anaphylaxis, Itching   Morphine And Related Anaphylaxis, Itching   Shellfish Allergy Anaphylaxis, Itching   Pt reports "watery eyes"   Ultram [tramadol Hcl] Hives   Hives and swollen lips       Medication List    STOP taking these medications   metoCLOPramide 5 MG tablet Commonly known as: REGLAN     TAKE these medications   blood glucose meter kit and supplies Dispense based on patient and insurance preference. Use up to four times daily as directed. (FOR ICD-10 E10.9, E11.9).   blood glucose meter kit and supplies Kit Dispense based on patient and insurance preference. Use up to four times daily as directed. (FOR ICD-9 250.00, 250.01).   cyclobenzaprine 10 MG tablet Commonly known as: FLEXERIL Take 1 tablet (10 mg total) by mouth 3 (three) times daily as needed for muscle spasms.   insulin aspart 100 UNIT/ML FlexPen Commonly known as: NOVOLOG Inject 20 Units into the skin 3 (three) times daily with meals. For blood sugars 0-150 give 0 units of insulin, 151-200 give 2  units of insulin, 201-250 give 4 units, 251-300 give 6 units, 301-350 give 8 units, 351-400 give 10 units,> 400 give 12 units and call M.D. Discussed hypoglycemia protocol.   Insulin Pen Needle 31G X 5 MM Misc 1 Device by Does not apply route 4 (four) times daily. For use with insulin pens   labetalol 200 MG tablet Commonly known as: NORMODYNE Take 1 tablet (200 mg total) by mouth 2 (two) times daily.   promethazine 25 MG tablet Commonly known as: PHENERGAN Take 0.5-1 tablets (12.5-25 mg total) by mouth every 6 (six) hours as needed for nausea or vomiting.       Follow-up [place] [facility]. Schedule an appointment as soon as possible for a visit in 4 day(s).   Specialty: Obstetrics and Gynecology Contact information: [place] [address] [phone]  [place] Student 12/08/2020  7:33 PM

## 2020-12-08 NOTE — ED Provider Notes (Signed)
Emergency Medicine Provider OB Triage Evaluation Note  Misty [name] is a 30 y.o. female, G2P0010, at [redacted]w[redacted]d gestation who presents to the emergency department with complaints of nausea and vomiting for the last several days.  Review of  Systems  Positive: Nausea and vomiting, generalized abdominal discomfort with vomiting. Negative: Other abdominal pain, vaginal bleeding, fever  Physical Exam  BP 126/83   Pulse 65   Temp 98.5 F (36.9 C)   Resp 16   LMP 10/14/2020   SpO2 99%  General: Awake, no distress  HEENT: Atraumatic  Resp: Normal effort  Cardiac: Normal rate Abd: Nondistended, nontender  MSK: Moves all extremities without difficulty Neuro: Speech clear  Medical Decision Making  Pt evaluated for pregnancy concern and is stable for transfer to [facility]U. Pt is in agreement with plan for transfer.  1:33 PM Discussed with MAU APP, Shawna Orleans, who accepts patient in transfer.  Clinical Impression  No diagnosis found.     Anselm Pancoast, PA-C 12/08/20 1416    Gerhard Munch, MD 12/09/20 [phone]

## 2020-12-08 NOTE — Discharge Instructions (Signed)
Morning Sickness  Morning sickness is when you feel like you may vomit (feel nauseous) during pregnancy. Sometimes, you may vomit. Morning sickness most often happens in the morning, but it can also happen at any time of the day. Some women may have morning sickness that makes them vomit all the time. This is a more serious problem that needs treatment. What are the causes? The cause of this condition is not known. What increases the risk?  You had vomiting or a feeling like you may vomit before your pregnancy.  You had morning sickness in another pregnancy.  You are pregnant with more than one baby, such as twins. What are the signs or symptoms?  Feeling like you may vomit.  Vomiting. How is this treated? Treatment is usually not needed for this condition. You may only need to change what you eat. In some cases, your doctor may give you some things to take for your condition. These include:  Vitamin B6 supplements.  Medicines to treat the feeling that you may vomit.  Ginger. Follow these instructions at home: Medicines  Take over-the-counter and prescription medicines only as told by your doctor. Do not take any medicines until you talk with your doctor about them first.  Take multivitamins before you get pregnant. These can stop or lessen the symptoms of morning sickness. Eating and drinking  Eat dry toast or crackers before getting out of bed.  Eat 5 or 6 small meals a day.  Eat dry and bland foods like rice and baked potatoes.  Do not eat greasy, fatty, or spicy foods.  Have someone cook for you if the smell of food causes you to vomit or to feel like you may vomit.  If you feel like you may vomit after taking prenatal vitamins, take them at night or with a snack.  Eat protein foods when you need a snack. Nuts, yogurt, and cheese are good choices.  Drink fluids throughout the day.  Try ginger ale made with real ginger, ginger tea made from fresh grated ginger, or  ginger candies. General instructions  Do not smoke or use any products that contain nicotine or tobacco. If you need help quitting, ask your doctor.  Use an air purifier to keep the air in your house free of smells.  Get lots of fresh air.  Try to avoid smells that make you feel sick.  Try wearing an acupressure wristband. This is a wristband that is used to treat seasickness.  Try a treatment called acupuncture. In this treatment, a doctor puts needles into certain areas of your body to make you feel better. Contact a doctor if:  You need medicine to feel better.  You feel dizzy or light-headed.  You are losing weight. Get help right away if:  The feeling that you may vomit will not go away, or you cannot stop vomiting.  You faint.  You have very bad pain in your belly. Summary  Morning sickness is when you feel like you may vomit (feel nauseous) during pregnancy.  You may feel sick in the morning, but you can feel this way at any time of the day.  Making some changes to what you eat may help your symptoms go away. This information is not intended to replace advice given to you by your health care provider. Make sure you discuss any questions you have with your health care provider. Document Revised: 04/04/2020 Document Reviewed: 03/14/2020 Elsevier Patient Education  2021 [employer]  

## 2020-12-08 NOTE — MAU Provider Note (Addendum)
History     CSN: [id number]  Arrival date and time: 12/08/20 1350   Chief Complaint  Patient presents with  . Emesis   30 y.o. G2P0010 $RemoveBefo'@[redacted]w[redacted]d'wLpXyOQrrTE$  with confirmed IUP presenting with N/V and abd pain. N/V started 4 days ago. She is able to tolerate ginger ale but vomits any solid food. Abd pain is intermittent q3-4 hrs. Rates pain 8/10. Eating makes it worse. Pain improves with time and rest. Denies fevers, diarrhea, and VB. She was seen in MAU 3 days ago and started on Insulin TID with meals but reports only taking it when she gets home from work at night. Last time she took it was last night.     OB History    Gravida  2   Para  0   Term  0   Preterm  0   AB  1   Living  0     SAB  1   IAB  0   Ectopic  0   Multiple  0   Live Births              Past Medical History:  Diagnosis Date  . Allergy   . Anemia 2015  . Asthma    as child  . Diabetes mellitus without complication ([place]) 1017  . Eczema   . Hypertension 09/2013    Past Surgical History:  Procedure Laterality Date  . Abcess on back      Family History  Problem Relation Age of Onset  . Hypertension Mother   . Heart disease Mother 90       CHF  . Diabetes Mother   . Sickle cell anemia Brother   . Hypertension Maternal Grandfather   . Diabetes Maternal Grandfather   . Heart disease Maternal Grandfather   . Healthy Father   . Diabetes Maternal Grandmother   . Heart disease Maternal Grandmother     Social History   Tobacco Use  . Smoking status: Former Smoker    Packs/day: 0.50    Years: 2.00    Pack years: 1.00    Quit date: 09/08/2013    Years since quitting: 7.2  . Smokeless tobacco: Never Used  Vaping Use  . Vaping Use: Never used  Substance Use Topics  . Alcohol use: No    Alcohol/week: 0.0 standard drinks  . Drug use: No    Allergies:  Allergies  Allergen Reactions  . Iodides Anaphylaxis and Itching  . Morphine And Related Anaphylaxis and Itching  . Shellfish Allergy  Anaphylaxis and Itching    Pt reports "watery eyes"  . Ultram [Tramadol Hcl] Hives    Hives and swollen lips     No medications prior to admission.    Review of Systems  Constitutional: Negative for chills and fever.  Gastrointestinal: Positive for abdominal pain, nausea and vomiting. Negative for diarrhea.  Genitourinary: Negative for dysuria, frequency, urgency, vaginal bleeding and vaginal discharge.   Physical Exam   Blood pressure (!) 151/82, pulse 80, temperature 98.5 F (36.9 C), resp. rate 15, height $RemoveBe'5\' 3"'vZomRWlrj$  (1.6 m), weight 84.8 kg, last menstrual period 10/14/2020, SpO2 100 %. Patient Vitals for the past 24 hrs:  BP Temp Pulse Resp SpO2 Height Weight  12/08/20 1924 (!) 151/82 -- 80 15 100 % -- --  12/08/20 1524 131/80 98.5 F (36.9 C) 80 18 -- $Rem'5\' 3"'FmwC$  (1.6 m) 84.8 kg  12/08/20 1332 126/83 98.5 F (36.9 C) 65 16 99 % -- --    Physical  Exam Vitals and nursing note reviewed.  Constitutional:      Appearance: Normal appearance.  HENT:     Head: Normocephalic and atraumatic.  Cardiovascular:     Rate and Rhythm: Normal rate.  Pulmonary:     Effort: Pulmonary effort is normal. No respiratory distress.  Abdominal:     General: There is no distension.     Palpations: Abdomen is soft. There is no mass.     Tenderness: There is no abdominal tenderness. There is no guarding or rebound.     Hernia: No hernia is present.  Musculoskeletal:        General: Normal range of motion.     Cervical back: Normal range of motion.  Skin:    General: Skin is warm and dry.  Neurological:     General: No focal deficit present.     Mental Status: She is alert and oriented to person, place, and time.  Psychiatric:        Mood and Affect: Mood normal.        Behavior: Behavior normal.    Results for orders placed or performed during the hospital encounter of 12/08/20 (from the past 24 hour(s))  Urinalysis, Routine w reflex microscopic Urine, Clean Catch     Status: Abnormal    Collection Time: 12/08/20  3:26 PM  Result Value Ref Range   Color, Urine YELLOW YELLOW   APPearance CLEAR CLEAR   Specific Gravity, Urine 1.010 1.005 - 1.030   pH 6.0 5.0 - 8.0   Glucose, UA >=500 (A) NEGATIVE mg/dL   Hgb urine dipstick NEGATIVE NEGATIVE   Bilirubin Urine NEGATIVE NEGATIVE   Ketones, ur NEGATIVE NEGATIVE mg/dL   Protein, ur NEGATIVE NEGATIVE mg/dL   Nitrite NEGATIVE NEGATIVE   Leukocytes,Ua NEGATIVE NEGATIVE   RBC / HPF 0-5 0 - 5 RBC/hpf   WBC, UA 0-5 0 - 5 WBC/hpf   Bacteria, UA NONE SEEN NONE SEEN   Squamous Epithelial / LPF 0-5 0 - 5  Glucose, capillary     Status: Abnormal   Collection Time: 12/08/20  4:52 PM  Result Value Ref Range   Glucose-Capillary 164 (H) 70 - 99 mg/dL  Comprehensive metabolic panel Once     Status: Abnormal   Collection Time: 12/08/20  5:39 PM  Result Value Ref Range   Sodium 132 (L) 135 - 145 mmol/L   Potassium 3.8 3.5 - 5.1 mmol/L   Chloride 103 98 - 111 mmol/L   CO2 24 22 - 32 mmol/L   Glucose, Bld 186 (H) 70 - 99 mg/dL   BUN <5 (L) 6 - 20 mg/dL   Creatinine, Ser 0.58 0.44 - 1.00 mg/dL   Calcium 9.1 8.9 - 10.3 mg/dL   Total Protein 6.9 6.5 - 8.1 g/dL   Albumin 3.7 3.5 - 5.0 g/dL   AST 15 15 - 41 U/L   ALT 15 0 - 44 U/L   Alkaline Phosphatase 61 38 - 126 U/L   Total Bilirubin 0.5 0.3 - 1.2 mg/dL   GFR, Estimated >60 >60 mL/min   Anion gap 5 5 - 15  CBC     Status: Abnormal   Collection Time: 12/08/20  5:39 PM  Result Value Ref Range   WBC 6.6 4.0 - 10.5 K/uL   RBC 3.55 (L) 3.87 - 5.11 MIL/uL   Hemoglobin 11.4 (L) 12.0 - 15.0 g/dL   HCT 32.9 (L) 36.0 - 46.0 %   MCV 92.7 80.0 - 100.0 fL   MCH  32.1 26.0 - 34.0 pg   MCHC 34.7 30.0 - 36.0 g/dL   RDW 12.5 11.5 - 15.5 %   Platelets 333 150 - 400 K/uL   nRBC 0.0 0.0 - 0.2 %   MAU Course  Procedures Phenergan  MDM Labs ordered and reviewed. No signs of acute abd process. Feels better. Tolerating gingerale, crackers, and PB. No further emesis. Pt eager to discharge home.  Instructed pt to take Insulin as soon as she arrives home and to start taking TID with meals even if she is at work. She should also resume her Labetalol. Stable for discharge home.   Assessment and Plan   1. [redacted] weeks gestation of pregnancy   2. Morning sickness   3. Pregnancy with type 2 diabetes mellitus in first trimester   4. Chronic hypertension affecting pregnancy    Discharge home Follow up at [facility] to start care Return precautions Rx Phenergan  Allergies as of 12/08/2020      Reactions   Iodides Anaphylaxis, Itching   Morphine And Related Anaphylaxis, Itching   Shellfish Allergy Anaphylaxis, Itching   Pt reports "watery eyes"   Ultram [tramadol Hcl] Hives   Hives and swollen lips       Medication List    STOP taking these medications   metoCLOPramide 5 MG tablet Commonly known as: REGLAN     TAKE these medications   blood glucose meter kit and supplies Dispense based on patient and insurance preference. Use up to four times daily as directed. (FOR ICD-10 E10.9, E11.9).   blood glucose meter kit and supplies Kit Dispense based on patient and insurance preference. Use up to four times daily as directed. (FOR ICD-9 250.00, 250.01).   cyclobenzaprine 10 MG tablet Commonly known as: FLEXERIL Take 1 tablet (10 mg total) by mouth 3 (three) times daily as needed for muscle spasms.   insulin aspart 100 UNIT/ML FlexPen Commonly known as: NOVOLOG Inject 20 Units into the skin 3 (three) times daily with meals. For blood sugars 0-150 give 0 units of insulin, 151-200 give 2 units of insulin, 201-250 give 4 units, 251-300 give 6 units, 301-350 give 8 units, 351-400 give 10 units,> 400 give 12 units and call M.D. Discussed hypoglycemia protocol.   Insulin Pen Needle 31G X 5 MM Misc 1 Device by Does not apply route 4 (four) times daily. For use with insulin pens   labetalol 200 MG tablet Commonly known as: NORMODYNE Take 1 tablet (200 mg total) by mouth 2 (two) times daily.    promethazine 25 MG tablet Commonly known as: PHENERGAN Take 0.5-1 tablets (12.5-25 mg total) by mouth every 6 (six) hours as needed for nausea or vomiting.      Julianne Handler, CNM 12/08/2020, 7:38 PM

## 2020-12-09 ENCOUNTER — Encounter: Payer: Self-pay | Admitting: Endocrinology

## 2020-12-16 ENCOUNTER — Encounter (HOSPITAL_COMMUNITY): Payer: Self-pay | Admitting: Obstetrics & Gynecology

## 2020-12-16 ENCOUNTER — Other Ambulatory Visit: Payer: Self-pay

## 2020-12-16 ENCOUNTER — Inpatient Hospital Stay (HOSPITAL_COMMUNITY)
Admission: AD | Admit: 2020-12-16 | Discharge: 2020-12-16 | Disposition: A | Discharge status: Home / Self Care | Payer: 59 | Attending: Obstetrics & Gynecology | Admitting: Obstetrics & Gynecology

## 2020-12-16 DIAGNOSIS — Z3A01 Less than 8 weeks gestation of pregnancy: Secondary | ICD-10-CM | POA: Insufficient documentation

## 2020-12-16 DIAGNOSIS — O99891 Other specified diseases and conditions complicating pregnancy: Secondary | ICD-10-CM

## 2020-12-16 DIAGNOSIS — N3001 Acute cystitis with hematuria: Secondary | ICD-10-CM | POA: Insufficient documentation

## 2020-12-16 DIAGNOSIS — O24112 Pre-existing diabetes mellitus, type 2, in pregnancy, second trimester: Secondary | ICD-10-CM | POA: Insufficient documentation

## 2020-12-16 DIAGNOSIS — Z87891 Personal history of nicotine dependence: Secondary | ICD-10-CM | POA: Insufficient documentation

## 2020-12-16 DIAGNOSIS — Z794 Long term (current) use of insulin: Secondary | ICD-10-CM | POA: Diagnosis not present

## 2020-12-16 DIAGNOSIS — Z79899 Other long term (current) drug therapy: Secondary | ICD-10-CM | POA: Insufficient documentation

## 2020-12-16 DIAGNOSIS — O2311 Infections of bladder in pregnancy, first trimester: Secondary | ICD-10-CM | POA: Diagnosis not present

## 2020-12-16 DIAGNOSIS — O10912 Unspecified pre-existing hypertension complicating pregnancy, second trimester: Secondary | ICD-10-CM | POA: Diagnosis not present

## 2020-12-16 LAB — URINALYSIS, ROUTINE W REFLEX MICROSCOPIC
Bilirubin Urine: NEGATIVE
Glucose, UA: >=500 mg/dL — AB
Hgb urine dipstick: NEGATIVE
Ketones, ur: 5 mg/dL — AB
Leukocytes,Ua: NEGATIVE
Nitrite: NEGATIVE
Protein, ur: NEGATIVE mg/dL
Specific Gravity, Urine: 1.027 (ref 1.005–1.030)
pH: 5 (ref 5.0–8.0)

## 2020-12-16 LAB — GLUCOSE, CAPILLARY: Glucose-Capillary: 146 mg/dL — ABNORMAL HIGH (ref 70–99)

## 2020-12-16 MED ORDER — CEFADROXIL 500 MG PO CAPS: 500.0000 mg | ORAL_CAPSULE | Freq: Two times a day (BID) | ORAL | 0 refills | Status: DC

## 2020-12-16 NOTE — MAU Note (Signed)
Noted blood in her urine around 1100.  Also having a little stomach pain.  Having frequency and urgency, yet only goes a small amt.  Denies pain with urination.

## 2020-12-16 NOTE — Discharge Instructions (Signed)
Pregnancy and Urinary Tract Infection  A urinary tract infection (UTI) is an infection of any part of the urinary tract. This includes the kidneys, the tubes that connect your kidneys to your bladder (ureters), the bladder, and the tube that carries urine out of your body (urethra). These organs make, store, and get rid of urine in the body. Your health care provider may use other names to describe the infection. An upper UTI affects the ureters and kidneys (pyelonephritis). A lower UTI affects the bladder (cystitis) and urethra (urethritis). Most urinary tract infections are caused by bacteria in your genital area, around the entrance to your urinary tract (urethra). These bacteria grow and cause irritation and inflammation of your urinary tract. You are more likely to develop a UTI during pregnancy because the physical and hormonal changes your body goes through can make it easier for bacteria to get into your urinary tract. Your growing baby also puts pressure on your bladder and can affect urine flow. It is important to recognize and treat UTIs in pregnancy because of the risk of serious complications for both you and your baby. How does this affect me? Symptoms of a UTI include:  Needing to urinate right away (urgently).  Frequent urination or passing small amounts of urine frequently.  Pain or burning with urination.  Blood in the urine.  Urine that smells bad or unusual.  Trouble urinating.  Cloudy urine.  Pain in the abdomen or lower back.  Vaginal discharge. You may also have:  Vomiting or a decreased appetite.  Confusion.  Irritability or tiredness.  A fever.  Diarrhea. How does this affect my baby? An untreated UTI during pregnancy could lead to a kidney infection or a systemic infection, which can cause health problems that could affect your baby. Possible complications of an untreated UTI include:  Giving birth to your baby before 37 weeks of pregnancy  (premature).  Having a baby with a low birth weight.  Developing high blood pressure during pregnancy (preeclampsia).  Having a low hemoglobin level (anemia). What can I do to lower my risk? To prevent a UTI:  Go to the bathroom as soon as you feel the need. Do not hold urine for long periods of time.  Always wipe from front to back, especially after a bowel movement. Use each tissue one time when you wipe.  Empty your bladder after sex.  Keep your genital area dry.  Drink 6-10 glasses of water each day.  Do not douche or use deodorant sprays. How is this treated? Treatment for this condition may include:  Antibiotic medicines that are safe to take during pregnancy.  Other medicines to treat less common causes of UTI. Follow these instructions at home:  If you were prescribed an antibiotic medicine, take it as told by your health care provider. Do not stop using the antibiotic even if you start to feel better.  Keep all follow-up visits as told by your health care provider. This is important. Contact a health care provider if:  Your symptoms do not improve or they get worse.  You have abnormal vaginal discharge. Get help right away if you:  Have a fever.  Have nausea and vomiting.  Have back or side pain.  Feel contractions in your uterus.  Have lower belly pain.  Have a gush of fluid from your vagina.  Have blood in your urine. Summary  A urinary tract infection (UTI) is an infection of any part of the urinary tract, which includes the   kidneys, ureters, bladder, and urethra.  Most urinary tract infections are caused by bacteria in your genital area, around the entrance to your urinary tract (urethra).  You are more likely to develop a UTI during pregnancy.  If you were prescribed an antibiotic medicine, take it as told by your health care provider. Do not stop using the antibiotic even if you start to feel better. This information is not intended to  replace advice given to you by your health care provider. Make sure you discuss any questions you have with your health care provider. Document Revised: 12/12/2018 Document Reviewed: 07/24/2018 Elsevier Patient Education  2021 [employer]        Pyelonephritis During Pregnancy  What are the causes? This condition is caused by a bacterial infection in the lower urinary tract that spreads to the kidney. What increases the risk? You are more likely to develop this condition if:  You have diabetes.  You have a history of frequent urinary tract infections. What are the signs or symptoms? Symptoms of this condition may begin with symptoms of a lower urinary tract infection. These may include:  A frequent urge to pass urine.  Burning pain when passing urine.  Pain and pressure in your lower abdomen.  Blood in your urine.  Cloudy or smelly urine. As the infection spreads to your kidney, you may have these symptoms:  Fever.  Chills.  Pain and tenderness in your upper abdomen or in your back and sides (flank pain). Flank pain often affects one side of the body, usually the right side.  Nausea, vomiting, or loss of appetite. How is this diagnosed? This condition may be diagnosed based on:  Your symptoms and medical history.  A physical exam.  Tests to confirm the diagnosis. These may include: ? Blood tests to check kidney function and look for signs of infection. ? Urine tests to check for signs of infection, including bacteria, white or red blood cells, and protein. ? Tests to grow and identify the type of bacteria that is causing the infection (urine culture). ? Imaging studies of your kidneys to learn more about your condition. How is this treated? This condition is treated in the hospital with antibiotics that are given into one of your veins through an IV. Your health care provider:  Will start you on an antibiotic that is effective against common urinary tract  infections.  May switch to another antibiotic if the results of your urine culture show that your infection is caused by different bacteria.  Will be careful to choose antibiotics that are the safest during pregnancy. Other treatments may include:  IV fluids if you are nauseous and not able to drink fluids.  Pain and fever medicines.  Medicines for nausea and vomiting. You will be able to go home when your infection is under control. To prevent another infection, you may need to continue taking antibiotics by mouth until your baby is born. Follow these instructions at home: Medicines  Take over-the-counter and prescription medicines only as told by your health care provider.  Take your antibiotic medicine as told by your health care provider. Do not stop taking the antibiotic even if you start to feel better.  Continue to take your prenatal vitamins.   Lifestyle  Follow instructions from your health care provider about eating or drinking restrictions. You may need to avoid: ? Foods and drinks with added sugar. ? Caffeine and fruit juice.  Drink enough fluid to keep your urine pale yellow.  Go to the bathroom frequently. Do not hold your urine. Try to empty your bladder completely.  Change your underwear every day. Wear all-cotton underwear. Do not wear tight underwear or pants.   General instructions  Take these steps to lower the risk of bacteria getting into your urinary tract: ? Use liquid soap instead of bar soap when showering or bathing. Bacteria can grow on bar soap. ? When you wash yourself, clean the urethra opening first. Use a washcloth to clean the area between your vagina and anus. Pat the area dry with a clean towel. ? Wash your hands before and after you go to the bathroom. ? Wipe yourself from front to back after going to the bathroom. ? Do not use douches, perfumed soap, creams, or powders. ? Do not soak in a bath for more than 30 minutes.  Return to your  normal activities as told by your health care provider. Ask your health care provider what activities are safe for you.  Keep all follow-up visits as told by your health care provider. This is important.   Contact a health care provider if:  You have chills or a fever.  You have any symptoms of infection that do not get better at home.  Symptoms of infection come back.  You have a reaction or side effects from your antibiotic. Get help right away if:  You start having contractions. Summary  Pyelonephritis is an infection of the kidney or kidneys.  This condition results when a bacterial infection in the lower urinary tract spreads to the kidney.  Lower urinary tract infections are common during pregnancy.  Pyelonephritis causes chills, a fever, flank pain, and nausea.  Pyelonephritis is a serious infection that is usually treated in the hospital with IV antibiotics. This information is not intended to replace advice given to you by your health care provider. Make sure you discuss any questions you have with your health care provider. Document Revised: 12/12/2018 Document Reviewed: 11/21/2017 Elsevier Patient Education  2021 [employer]        Kidney Stones  Kidney stones are solid, rock-like deposits that form inside of the kidneys. The kidneys are a pair of organs that make urine. A kidney stone may form in a kidney and move into other parts of the urinary tract, including the tubes that connect the kidneys to the bladder (ureters), the bladder, and the tube that carries urine out of the body (urethra). As the stone moves through these areas, it can cause intense pain and block the flow of urine. Kidney stones are created when high levels of certain minerals are found in the urine. The stones are usually passed out of the body through urination, but in some cases, medical treatment may be needed to remove them. What are the causes? Kidney stones may be caused by:  A  condition in which certain glands produce too much parathyroid hormone (primary hyperparathyroidism), which causes too much calcium buildup in the blood.  A buildup of uric acid crystals in the bladder (hyperuricosuria). Uric acid is a chemical that the body produces when you eat certain foods. It usually exits the body in the urine.  Narrowing (stricture) of one or both of the ureters.  A kidney blockage that is present at birth (congenital obstruction).  Past surgery on the kidney or the ureters, such as gastric bypass surgery. What increases the risk? The following factors may make you more likely to develop this condition:  Having had a kidney stone in  the past.  Having a family history of kidney stones.  Not drinking enough water.  Eating a diet that is high in protein, salt (sodium), or sugar.  Being overweight or obese. What are the signs or symptoms? Symptoms of a kidney stone may include:  Pain in the side of the abdomen, right below the ribs (flank pain). Pain usually spreads (radiates) to the groin.  Needing to urinate frequently or urgently.  Painful urination.  Blood in the urine (hematuria).  Nausea.  Vomiting.  Fever and chills. How is this diagnosed? This condition may be diagnosed based on:  Your symptoms and medical history.  A physical exam.  Blood tests.  Urine tests. These may be done before and after the stone passes out of your body through urination.  Imaging tests, such as a CT scan, abdominal X-ray, or ultrasound.  A procedure to examine the inside of the bladder (cystoscopy). How is this treated? Treatment for kidney stones depends on the size, location, and makeup of the stones. Kidney stones will often pass out of the body through urination. You may need to:  Increase your fluid intake to help pass the stone. In some cases, you may be given fluids through an IV and may need to be monitored at the hospital.  Take medicine for  pain.  Make changes in your diet to help prevent kidney stones from coming back. Sometimes, medical procedures are needed to remove a kidney stone. This may involve:  A procedure to break up kidney stones using: ? A focused beam of light (laser therapy). ? Shock waves (extracorporeal shock wave lithotripsy).  Surgery to remove kidney stones. This may be needed if you have severe pain or have stones that block your urinary tract. Follow these instructions at home: Medicines  Take over-the-counter and prescription medicines only as told by your health care provider.  Ask your health care provider if the medicine prescribed to you requires you to avoid driving or using heavy machinery. Eating and drinking  Drink enough fluid to keep your urine pale yellow. You may be instructed to drink at least 8-10 glasses of water each day. This will help you pass the kidney stone.  If directed, change your diet. This may include: ? Limiting how much sodium you eat. ? Eating more fruits and vegetables. ? Limiting how much animal protein--such as red meat, poultry, fish, and eggs--you eat.  Follow instructions from your health care provider about eating or drinking restrictions. General instructions  Collect urine samples as told by your health care provider. You may need to collect a urine sample: ? 24 hours after you pass the stone. ? 8-12 weeks after passing the kidney stone, and every 6-12 months after that.  Strain your urine every time you urinate, for as long as directed. Use the strainer that your health care provider recommends.  Do not throw out the kidney stone after passing it. Keep the stone so it can be tested by your health care provider. Testing the makeup of your kidney stone may help prevent you from getting kidney stones in the future.  Keep all follow-up visits as told by your health care provider. This is important. You may need follow-up X-rays or ultrasounds to make sure that  your stone has passed. How is this prevented? To prevent another kidney stone:  Drink enough fluid to keep your urine pale yellow. This is the best way to prevent kidney stones.  Eat a healthy diet and follow recommendations from  your health care provider about foods to avoid. You may be instructed to eat a low-protein diet. Recommendations vary depending on the type of kidney stone that you have.  Maintain a healthy weight.   Where to find more information  [employer] ([employer]F): www.kidney.org  [facility]): www.urologyhealth.org Contact a health care provider if:  You have pain that gets worse or does not get better with medicine. Get help right away if:  You have a fever or chills.  You develop severe pain.  You develop new abdominal pain.  You faint.  You are unable to urinate. Summary  Kidney stones are solid, rock-like deposits that form inside of the kidneys.  Kidney stones can cause nausea, vomiting, blood in the urine, abdominal pain, and the urge to urinate frequently.  Treatment for kidney stones depends on the size, location, and makeup of the stones. Kidney stones will often pass out of the body through urination.  Kidney stones can be prevented by drinking enough fluids, eating a healthy diet, and maintaining a healthy weight. This information is not intended to replace advice given to you by your health care provider. Make sure you discuss any questions you have with your health care provider. Document Revised: 01/06/2019 Document Reviewed: 01/06/2019 Elsevier Patient Education  2021 [employer].

## 2020-12-16 NOTE — MAU Provider Note (Signed)
History     CSN: [id number]  Arrival date and time: 12/16/20 1244   Event Date/Time   First Provider Initiated Contact with Patient 12/16/20 1712      Chief Complaint  Patient presents with  . Abdominal Pain  . Hematuria   Misty [name] is a 30 y.o. G2P0010 at [redacted]w[redacted]d who presents to MAU for blood in urine. Patient reports yesterday she saw a few small blood clots in her urine. Patient denies any bleeding in her underwear or any frank bleeding in her urine. Patient denies back pain, nausea, vomiting and fever. Patient reports she does have mild lower abdominal cramping. Patient has previously confirmed IUP on 12/05/2020. Patient also reports frequency with urination and urgency, but denies dysuria. Patient reports symptoms are slowly resolving.  Pt denies VB, LOF, ctx, decreased FM, vaginal discharge/odor/itching. Pt denies N/V, abdominal pain, constipation, diarrhea, or urinary problems. Pt denies fever, chills, fatigue, sweating or changes in appetite. Pt denies SOB or chest pain. Pt denies dizziness, HA, light-headedness, weakness.  Problems this pregnancy include: Type II DM. Allergies? Iodine, morphine, shellfish, Tramadol Current medications/supplements? Novolog, PNV Prenatal care provider? [facility]   OB History    Gravida  2   Para  0   Term  0   Preterm  0   AB  1   Living  0     SAB  1   IAB  0   Ectopic  0   Multiple  0   Live Births              Past Medical History:  Diagnosis Date  . Allergy   . Anemia 2015  . Asthma    as child  . Diabetes mellitus without complication ([place]) 8921  . Eczema   . Hypertension 09/2013    Past Surgical History:  Procedure Laterality Date  . Abcess on back      Family History  Problem Relation Age of Onset  . Hypertension Mother   . Heart disease Mother 69       CHF  . Diabetes Mother   . Sickle cell anemia Brother   . Hypertension Maternal Grandfather   . Diabetes Maternal Grandfather   .  Heart disease Maternal Grandfather   . Healthy Father   . Diabetes Maternal Grandmother   . Heart disease Maternal Grandmother     Social History   Tobacco Use  . Smoking status: Former Smoker    Packs/day: 0.50    Years: 2.00    Pack years: 1.00    Quit date: 09/08/2013    Years since quitting: 7.2  . Smokeless tobacco: Never Used  Vaping Use  . Vaping Use: Never used  Substance Use Topics  . Alcohol use: No    Alcohol/week: 0.0 standard drinks  . Drug use: No    Allergies:  Allergies  Allergen Reactions  . Iodides Anaphylaxis and Itching  . Morphine And Related Anaphylaxis and Itching  . Shellfish Allergy Anaphylaxis and Itching    Pt reports "watery eyes"  . Ultram [Tramadol Hcl] Hives    Hives and swollen lips     Medications Prior to Admission  Medication Sig Dispense Refill Last Dose  . blood glucose meter kit and supplies KIT Dispense based on patient and insurance preference. Use up to four times daily as directed. (FOR ICD-9 250.00, 250.01). 1 each 0   . blood glucose meter kit and supplies Dispense based on patient and insurance preference. Use  up to four times daily as directed. (FOR ICD-10 E10.9, E11.9). 1 each 0   . cyclobenzaprine (FLEXERIL) 10 MG tablet Take 1 tablet (10 mg total) by mouth 3 (three) times daily as needed for muscle spasms. 60 tablet 1   . insulin aspart (NOVOLOG) 100 UNIT/ML FlexPen Inject 20 Units into the skin 3 (three) times daily with meals. For blood sugars 0-150 give 0 units of insulin, 151-200 give 2 units of insulin, 201-250 give 4 units, 251-300 give 6 units, 301-350 give 8 units, 351-400 give 10 units,> 400 give 12 units and call M.D. Discussed hypoglycemia protocol. 15 mL 1   . Insulin Pen Needle 31G X 5 MM MISC 1 Device by Does not apply route 4 (four) times daily. For use with insulin pens 100 each 11   . labetalol (NORMODYNE) 200 MG tablet Take 1 tablet (200 mg total) by mouth 2 (two) times daily. 60 tablet 1   . promethazine  (PHENERGAN) 25 MG tablet Take 0.5-1 tablets (12.5-25 mg total) by mouth every 6 (six) hours as needed for nausea or vomiting. 30 tablet 0     Review of Systems  Constitutional: Negative for chills, diaphoresis, fatigue and fever.  Eyes: Negative for visual disturbance.  Respiratory: Negative for shortness of breath.   Cardiovascular: Negative for chest pain.  Gastrointestinal: Negative for abdominal pain, constipation, diarrhea, nausea and vomiting.  Genitourinary: Positive for frequency, pelvic pain and urgency. Negative for dysuria, flank pain, vaginal bleeding and vaginal discharge.  Neurological: Negative for dizziness, weakness, light-headedness and headaches.   Physical Exam   Blood pressure 136/86, pulse 88, temperature 98.4 F (36.9 C), temperature source Oral, resp. rate 17, height $RemoveBe'5\' 3"'rFMvfQeXA$  (1.6 m), weight 83.4 kg, last menstrual period 10/14/2020, SpO2 100 %.  Patient Vitals for the past 24 hrs:  BP Temp Temp src Pulse Resp SpO2 Height Weight  12/16/20 1811 136/86 -- -- 88 -- -- -- --  12/16/20 1643 137/81 -- -- 65 -- -- -- --  12/16/20 1408 138/76 98.4 F (36.9 C) Oral 67 17 100 % $Rem'5\' 3"'GCXR$  (1.6 m) 83.4 kg   Physical Exam Vitals and nursing note reviewed.  Constitutional:      General: She is not in acute distress.    Appearance: Normal appearance. She is not ill-appearing, toxic-appearing or diaphoretic.  HENT:     Head: Normocephalic and atraumatic.  Pulmonary:     Effort: Pulmonary effort is normal.  Abdominal:     General: There is no distension.     Palpations: Abdomen is soft.     Tenderness: There is no abdominal tenderness. There is no guarding.  Skin:    General: Skin is warm and dry.  Neurological:     Mental Status: She is alert and oriented to person, place, and time.  Psychiatric:        Mood and Affect: Mood normal.        Behavior: Behavior normal.        Thought Content: Thought content normal.        Judgment: Judgment normal.    Results for orders  placed or performed during the hospital encounter of 12/16/20 (from the past 24 hour(s))  Urinalysis, Routine w reflex microscopic Urine, Clean Catch     Status: Abnormal   Collection Time: 12/16/20  2:35 PM  Result Value Ref Range   Color, Urine YELLOW YELLOW   APPearance CLEAR CLEAR   Specific Gravity, Urine 1.027 1.005 - 1.030   pH 5.0 5.0 -  8.0   Glucose, UA >=500 (A) NEGATIVE mg/dL   Hgb urine dipstick NEGATIVE NEGATIVE   Bilirubin Urine NEGATIVE NEGATIVE   Ketones, ur 5 (A) NEGATIVE mg/dL   Protein, ur NEGATIVE NEGATIVE mg/dL   Nitrite NEGATIVE NEGATIVE   Leukocytes,Ua NEGATIVE NEGATIVE   WBC, UA 0-5 0 - 5 WBC/hpf   Bacteria, UA RARE (A) NONE SEEN   Squamous Epithelial / LPF 0-5 0 - 5   Mucus PRESENT   Glucose, capillary     Status: Abnormal   Collection Time: 12/16/20  5:29 PM  Result Value Ref Range   Glucose-Capillary 146 (H) 70 - 99 mg/dL   US OB LESS THAN 14 WEEKS WITH OB TRANSVAGINAL  Result Date: 12/05/2020 CLINICAL DATA:  Lower abdominal pain EXAM: OBSTETRIC <14 WK [place] AND TRANSVAGINAL OB US TECHNIQUE: Both transabdominal and transvaginal ultrasound examinations were performed for complete evaluation of the gestation as well as the maternal uterus, adnexal regions, and pelvic cul-de-sac. Transvaginal technique was performed to assess early pregnancy. COMPARISON:  None. FINDINGS: Intrauterine gestational sac: Single Yolk sac:  Visualized. Embryo:  Visualized. Cardiac Activity: Visualized. Heart Rate: 115 bpm MSD:   mm    w     d CRL:  3.4 mm   5 w   6 d                  [place] EDC: 08/01/2021 Subchorionic hemorrhage:  None visualized. Maternal uterus/adnexae: No adnexal mass or free fluid. IMPRESSION: Five week 6 day intrauterine pregnancy. Fetal heart rate 150 beats per minute. No acute maternal findings. Electronically Signed   By: Rolm Baptise M.D.   On: 12/05/2020 19:26   MAU Course  Procedures  MDM -blood clots in urine yesterday, without back pain, CVA tenderness, nausea,  vomiting or fever, mild abdominal tenderness today per pt report without tenderness on exam with urgency and frequency of urination, without dysuria -Pt informed that the ultrasound is considered a limited OB ultrasound and is not intended to be a complete ultrasound exam.  Patient also informed that the ultrasound is not being completed with the intent of assessing for fetal or placental anomalies or any pelvic abnormalities.  Explained that the purpose of today's ultrasound is to assess for viability (FHR 162).  Patient acknowledges the purpose of the exam and the limitations of the study.   -UA: >/=500GLU/5ketones/rare bacteria, sending urine for culture -CBG: 146, pt reports last ate 5 hours ago -consulted with Dr. Harolyn Rutherford regarding CBG, reports 146 is not of concern and no adjustments need to be made to her insulin at this time -pt discharged to home in stable condition  Orders Placed This Encounter  Procedures  . Culture, OB Urine    Standing Status:   Standing    Number of Occurrences:   1  . Urinalysis, Routine w reflex microscopic Urine, Clean Catch    Standing Status:   Standing    Number of Occurrences:   1  . Glucose, capillary    Standing Status:   Standing    Number of Occurrences:   1  . Discharge patient    Order Specific Question:   Discharge disposition    Answer:   01-Home or Self Care [1]    Order Specific Question:   Discharge patient date    Answer:   12/16/2020   Meds ordered this encounter  Medications  . cefadroxil (DURICEF) 500 MG capsule    Sig: Take 1 capsule (500 mg total) by mouth  2 (two) times daily for 7 days.    Dispense:  14 capsule    Refill:  0    Order Specific Question:   Supervising Provider    Answer:   Verita Schneiders A [[id number]]   Assessment and Plan   1. Acute cystitis with hematuria   2. [redacted] weeks gestation of pregnancy    Allergies as of 12/16/2020      Reactions   Iodides Anaphylaxis, Itching   Morphine And Related Anaphylaxis, Itching    Shellfish Allergy Anaphylaxis, Itching   Pt reports "watery eyes"   Ultram [tramadol Hcl] Hives   Hives and swollen lips       Medication List    TAKE these medications   blood glucose meter kit and supplies Dispense based on patient and insurance preference. Use up to four times daily as directed. (FOR ICD-10 E10.9, E11.9).   blood glucose meter kit and supplies Kit Dispense based on patient and insurance preference. Use up to four times daily as directed. (FOR ICD-9 250.00, 250.01).   cefadroxil 500 MG capsule Commonly known as: DURICEF Take 1 capsule (500 mg total) by mouth 2 (two) times daily for 7 days.   cyclobenzaprine 10 MG tablet Commonly known as: FLEXERIL Take 1 tablet (10 mg total) by mouth 3 (three) times daily as needed for muscle spasms.   insulin aspart 100 UNIT/ML FlexPen Commonly known as: NOVOLOG Inject 20 Units into the skin 3 (three) times daily with meals. For blood sugars 0-150 give 0 units of insulin, 151-200 give 2 units of insulin, 201-250 give 4 units, 251-300 give 6 units, 301-350 give 8 units, 351-400 give 10 units,> 400 give 12 units and call M.D. Discussed hypoglycemia protocol.   Insulin Pen Needle 31G X 5 MM Misc 1 Device by Does not apply route 4 (four) times daily. For use with insulin pens   labetalol 200 MG tablet Commonly known as: NORMODYNE Take 1 tablet (200 mg total) by mouth 2 (two) times daily.   promethazine 25 MG tablet Commonly known as: PHENERGAN Take 0.5-1 tablets (12.5-25 mg total) by mouth every 6 (six) hours as needed for nausea or vomiting.      -message sent to [facility] to schedule pt and cancel [facility] appt -discussed s/sx of kidney stone, worsening UTI/pyelonepritis and when to return to MAU -pt discharged to home in stable condition  Elmyra Ricks E Shailynn Fong 12/16/2020, 6:16 PM

## 2020-12-18 LAB — CULTURE, OB URINE: Culture: <10000 — AB

## 2020-12-21 ENCOUNTER — Inpatient Hospital Stay (HOSPITAL_COMMUNITY)
Admission: AD | Admit: 2020-12-21 | Discharge: 2020-12-21 | Disposition: A | Discharge status: Home / Self Care | Payer: 59 | Attending: Obstetrics & Gynecology | Admitting: Obstetrics & Gynecology

## 2020-12-21 ENCOUNTER — Other Ambulatory Visit: Payer: Self-pay

## 2020-12-21 ENCOUNTER — Encounter (HOSPITAL_COMMUNITY): Payer: Self-pay | Admitting: Obstetrics & Gynecology

## 2020-12-21 DIAGNOSIS — Z3A08 8 weeks gestation of pregnancy: Secondary | ICD-10-CM

## 2020-12-21 DIAGNOSIS — O24111 Pre-existing diabetes mellitus, type 2, in pregnancy, first trimester: Secondary | ICD-10-CM | POA: Diagnosis not present

## 2020-12-21 DIAGNOSIS — Z87891 Personal history of nicotine dependence: Secondary | ICD-10-CM | POA: Diagnosis not present

## 2020-12-21 DIAGNOSIS — O219 Vomiting of pregnancy, unspecified: Secondary | ICD-10-CM | POA: Diagnosis not present

## 2020-12-21 DIAGNOSIS — Z885 Allergy status to narcotic agent status: Secondary | ICD-10-CM | POA: Insufficient documentation

## 2020-12-21 DIAGNOSIS — O210 Mild hyperemesis gravidarum: Secondary | ICD-10-CM | POA: Insufficient documentation

## 2020-12-21 DIAGNOSIS — Z794 Long term (current) use of insulin: Secondary | ICD-10-CM | POA: Diagnosis not present

## 2020-12-21 LAB — URINALYSIS, ROUTINE W REFLEX MICROSCOPIC
Bilirubin Urine: NEGATIVE
Glucose, UA: 150 mg/dL — AB
Hgb urine dipstick: NEGATIVE
Ketones, ur: 80 mg/dL — AB
Leukocytes,Ua: NEGATIVE
Nitrite: NEGATIVE
Protein, ur: 30 mg/dL — AB
Specific Gravity, Urine: 1.028 (ref 1.005–1.030)
pH: 5 (ref 5.0–8.0)

## 2020-12-21 LAB — COMPREHENSIVE METABOLIC PANEL
ALT: 15 U/L (ref 0–44)
AST: 16 U/L (ref 15–41)
Albumin: 3.8 g/dL (ref 3.5–5.0)
Alkaline Phosphatase: 61 U/L (ref 38–126)
Anion gap: 9 (ref 5–15)
BUN: 6 mg/dL (ref 6–20)
CO2: 21 mmol/L — ABNORMAL LOW (ref 22–32)
Calcium: 9 mg/dL (ref 8.9–10.3)
Chloride: 102 mmol/L (ref 98–111)
Creatinine, Ser: 0.64 mg/dL (ref 0.44–1.00)
GFR, Estimated: >60 mL/min (ref >60)
Glucose, Bld: 198 mg/dL — ABNORMAL HIGH (ref 70–99)
Potassium: 3.5 mmol/L (ref 3.5–5.1)
Sodium: 132 mmol/L — ABNORMAL LOW (ref 135–145)
Total Bilirubin: 0.6 mg/dL (ref 0.3–1.2)
Total Protein: 7.2 g/dL (ref 6.5–8.1)

## 2020-12-21 LAB — CBC
HCT: 38.3 % (ref 36.0–46.0)
Hemoglobin: 13.1 g/dL (ref 12.0–15.0)
MCH: 31.6 pg (ref 26.0–34.0)
MCHC: 34.2 g/dL (ref 30.0–36.0)
MCV: 92.3 fL (ref 80.0–100.0)
Platelets: 324 10*3/uL (ref 150–400)
RBC: 4.15 MIL/uL (ref 3.87–5.11)
RDW: 12.5 % (ref 11.5–15.5)
WBC: 6.8 10*3/uL (ref 4.0–10.5)
nRBC: 0 % (ref 0.0–0.2)

## 2020-12-21 MED ORDER — FAMOTIDINE 20 MG PO TABS: 20.0000 mg | ORAL_TABLET | Freq: Two times a day (BID) | ORAL | 1 refills | Status: DC

## 2020-12-21 MED ORDER — M.V.I. ADULT IV INJ
Freq: Once | INTRAVENOUS | Status: AC
  Filled 2020-12-21: qty 10

## 2020-12-21 MED ORDER — SODIUM CHLORIDE 0.9 % IV SOLN
25.0000 mg | Freq: Once | INTRAVENOUS | Status: AC
  Administered 2020-12-21: 25 mg via INTRAVENOUS
  Filled 2020-12-21: qty 1

## 2020-12-21 MED ORDER — ONDANSETRON 4 MG PO TBDP: 4.0000 mg | ORAL_TABLET | Freq: Three times a day (TID) | ORAL | 2 refills | Status: DC | PRN

## 2020-12-21 NOTE — MAU Note (Signed)
Pt has had no further episodes of nausea, vomiting or diarrhea. Pt remains sleepy and SO will be driving her home

## 2020-12-21 NOTE — MAU Note (Signed)
Found out preg a few wks ago. Has had n/v off and on.  Started throwing up at 0500 today, has continued all day.  Unable to keep anything down.  Feels weak.  No pain. Diarrhea started at 0500 also, loose and watery. No one else at home is sick.

## 2020-12-21 NOTE — MAU Note (Signed)
Pt states she constantly has to change positions because she is not comfortable but denies body aches or pain, denies uterine cramping

## 2020-12-21 NOTE — MAU Provider Note (Addendum)
Chief Complaint:  Emesis  None    HPI: Misty [name] is a 30 y.o. G2P0010 at [redacted]w[redacted]d who presents to maternity admissions via EMS reporting nausea, vomiting, and diarrhea. Patient reports symptoms started this morning at 0500. She has not been able to keep anything down. Reports 8-9 episodes of both vomiting and loose, watery stools. Denies fever or sick contacts. No abdominal pain, fever, vaginal bleeding, or urinary s/s. Of note, pt is T2DM, currently on Lantus TID, last took earlier today. Per pt, she has been compliant with insulin regimen.    Pregnancy Course:   Past Medical History:  Diagnosis Date  . Allergy   . Anemia 2015  . Asthma    as child  . Diabetes mellitus without complication ([place]) 2863  . Eczema   . Hypertension 09/2013   OB History  Gravida Para Term Preterm AB Living  2 0 0 0 1 0  SAB IAB Ectopic Multiple Live Births  1 0 0 0      # Outcome Date GA Lbr Len/2nd Weight Sex Delivery Anes PTL Lv  2 Current           1 SAB            Past Surgical History:  Procedure Laterality Date  . Abcess on back     Family History  Problem Relation Age of Onset  . Hypertension Mother   . Heart disease Mother 44       CHF  . Diabetes Mother   . Sickle cell anemia Brother   . Hypertension Maternal Grandfather   . Diabetes Maternal Grandfather   . Heart disease Maternal Grandfather   . Healthy Father   . Diabetes Maternal Grandmother   . Heart disease Maternal Grandmother    Social History   Tobacco Use  . Smoking status: Former Smoker    Packs/day: 0.50    Years: 2.00    Pack years: 1.00    Quit date: 09/08/2013    Years since quitting: 7.2  . Smokeless tobacco: Never Used  Vaping Use  . Vaping Use: Never used  Substance Use Topics  . Alcohol use: No    Alcohol/week: 0.0 standard drinks  . Drug use: No   Allergies  Allergen Reactions  . Iodides Anaphylaxis and Itching  . Morphine And Related Anaphylaxis and Itching  . Shellfish Allergy Anaphylaxis  and Itching    Pt reports "watery eyes"  . Ultram [Tramadol Hcl] Hives    Hives and swollen lips    Medications Prior to Admission  Medication Sig Dispense Refill Last Dose  . insulin glargine (LANTUS) 100 UNIT/ML injection Inject 18 Units into the skin 3 (three) times daily.     . blood glucose meter kit and supplies KIT Dispense based on patient and insurance preference. Use up to four times daily as directed. (FOR ICD-9 250.00, 250.01). 1 each 0   . blood glucose meter kit and supplies Dispense based on patient and insurance preference. Use up to four times daily as directed. (FOR ICD-10 E10.9, E11.9). 1 each 0   . cefadroxil (DURICEF) 500 MG capsule Take 1 capsule (500 mg total) by mouth 2 (two) times daily for 7 days. 14 capsule 0   . cyclobenzaprine (FLEXERIL) 10 MG tablet Take 1 tablet (10 mg total) by mouth 3 (three) times daily as needed for muscle spasms. 60 tablet 1   . insulin aspart (NOVOLOG) 100 UNIT/ML FlexPen Inject 20 Units into the skin 3 (three) times  daily with meals. For blood sugars 0-150 give 0 units of insulin, 151-200 give 2 units of insulin, 201-250 give 4 units, 251-300 give 6 units, 301-350 give 8 units, 351-400 give 10 units,> 400 give 12 units and call M.D. Discussed hypoglycemia protocol. 15 mL 1   . Insulin Pen Needle 31G X 5 MM MISC 1 Device by Does not apply route 4 (four) times daily. For use with insulin pens 100 each 11   . labetalol (NORMODYNE) 200 MG tablet Take 1 tablet (200 mg total) by mouth 2 (two) times daily. 60 tablet 1   . promethazine (PHENERGAN) 25 MG tablet Take 0.5-1 tablets (12.5-25 mg total) by mouth every 6 (six) hours as needed for nausea or vomiting. 30 tablet 0    I have reviewed patient's Past Medical Hx, Surgical Hx, Family Hx, Social Hx, medications and allergies.   ROS:  Review of Systems  Constitutional: Negative.  Negative for fatigue and fever.  HENT: Negative.   Respiratory: Negative.  Negative for shortness of breath.    Cardiovascular: Negative.  Negative for chest pain.  Gastrointestinal: Positive for diarrhea, nausea and vomiting. Negative for abdominal pain and constipation.  Genitourinary: Negative.  Negative for dysuria, vaginal bleeding and vaginal discharge.  Musculoskeletal: Negative.   Neurological: Negative.  Negative for dizziness and headaches.  Psychiatric/Behavioral: Negative.     Physical Exam   Patient Vitals for the past 24 hrs:  BP Temp Temp src Pulse Resp SpO2  12/21/20 1824 (!) 141/85 97.9 F (36.6 C) Axillary 99 20 100 %   Physical Exam Vitals and nursing note reviewed.  Constitutional:      General: She is not in acute distress.    Appearance: She is well-developed.  HENT:     Head: Normocephalic.  Eyes:     Pupils: Pupils are equal, round, and reactive to light.  Cardiovascular:     Rate and Rhythm: Normal rate and regular rhythm.  Pulmonary:     Effort: Pulmonary effort is normal. No respiratory distress.     Breath sounds: Normal breath sounds.  Abdominal:     Palpations: Abdomen is soft.     Tenderness: There is no abdominal tenderness.  Musculoskeletal:        General: Normal range of motion.     Cervical back: Normal range of motion.  Skin:    General: Skin is warm and dry.  Neurological:     Mental Status: She is alert and oriented to person, place, and time.  Psychiatric:        Behavior: Behavior normal.        Thought Content: Thought content normal.        Judgment: Judgment normal.      Labs: Results for orders placed or performed during the hospital encounter of 12/21/20 (from the past 24 hour(s))  Urinalysis, Routine w reflex microscopic Urine, Clean Catch     Status: Abnormal   Collection Time: 12/21/20  6:52 PM  Result Value Ref Range   Color, Urine YELLOW YELLOW   APPearance HAZY (A) CLEAR   Specific Gravity, Urine 1.028 1.005 - 1.030   pH 5.0 5.0 - 8.0   Glucose, UA 150 (A) NEGATIVE mg/dL   Hgb urine dipstick NEGATIVE NEGATIVE    Bilirubin Urine NEGATIVE NEGATIVE   Ketones, ur 80 (A) NEGATIVE mg/dL   Protein, ur 30 (A) NEGATIVE mg/dL   Nitrite NEGATIVE NEGATIVE   Leukocytes,Ua NEGATIVE NEGATIVE   WBC, UA 0-5 0 - 5 WBC/hpf  Bacteria, UA RARE (A) NONE SEEN   Squamous Epithelial / LPF 0-5 0 - 5   Mucus PRESENT   CBC     Status: None   Collection Time: 12/21/20  8:01 PM  Result Value Ref Range   WBC 6.8 4.0 - 10.5 K/uL   RBC 4.15 3.87 - 5.11 MIL/uL   Hemoglobin 13.1 12.0 - 15.0 g/dL   HCT 38.3 36.0 - 46.0 %   MCV 92.3 80.0 - 100.0 fL   MCH 31.6 26.0 - 34.0 pg   MCHC 34.2 30.0 - 36.0 g/dL   RDW 12.5 11.5 - 15.5 %   Platelets 324 150 - 400 K/uL   nRBC 0.0 0.0 - 0.2 %  Comprehensive metabolic panel     Status: Abnormal   Collection Time: 12/21/20  8:01 PM  Result Value Ref Range   Sodium 132 (L) 135 - 145 mmol/L   Potassium 3.5 3.5 - 5.1 mmol/L   Chloride 102 98 - 111 mmol/L   CO2 21 (L) 22 - 32 mmol/L   Glucose, Bld 198 (H) 70 - 99 mg/dL   BUN 6 6 - 20 mg/dL   Creatinine, Ser 0.64 0.44 - 1.00 mg/dL   Calcium 9.0 8.9 - 10.3 mg/dL   Total Protein 7.2 6.5 - 8.1 g/dL   Albumin 3.8 3.5 - 5.0 g/dL   AST 16 15 - 41 U/L   ALT 15 0 - 44 U/L   Alkaline Phosphatase 61 38 - 126 U/L   Total Bilirubin 0.6 0.3 - 1.2 mg/dL   GFR, Estimated >60 >60 mL/min   Anion gap 9 5 - 15     MAU Course: Orders Placed This Encounter  Procedures  . Urinalysis, Routine w reflex microscopic Urine, Clean Catch  . CBC  . Comprehensive metabolic panel  . Contact Isolation: Enteric   Meds ordered this encounter  Medications  . promethazine (PHENERGAN) 25 mg in sodium chloride 0.9 % 1,000 mL infusion  . multivitamins adult (INFUVITE ADULT) 10 mL in dextrose 5% lactated ringers 1,000 mL infusion  . ondansetron (ZOFRAN ODT) 4 MG disintegrating tablet    Sig: Take 1 tablet (4 mg total) by mouth every 8 (eight) hours as needed for nausea or vomiting.    Dispense:  30 tablet    Refill:  2    Order Specific Question:   Supervising  Provider    Answer:   Elonda Husky, LUTHER H [[id number]]  . famotidine (PEPCID) 20 MG tablet    Sig: Take 1 tablet (20 mg total) by mouth 2 (two) times daily.    Dispense:  60 tablet    Refill:  1    Order Specific Question:   Supervising Provider    Answer:   Florian Buff [[id number]]    MDM: UA  CBC, CMP Phenergan IV MVI  Care turned over to C. Adekunle Rohrbach CNM at 2030.  Maryagnes Amos CNM  No episodes of vomiting while in MAU. Patient reports improvement of nausea after IV fluids.   Assessment: 1. Nausea/vomiting in pregnancy   2. [redacted] weeks gestation of pregnancy     Plan:  -Discharge home in stable condition -Rx for zofran and pepcid sent to patient's pharmacy -First trimester precautions discussed -Patient advised to follow-up with OB as scheduled for prenatal care -Patient may return to MAU as needed or if her condition were to change or worsen    Follow-up [facility] for [facility] Follow  up.   Specialty: Obstetrics and Gynecology Why: As scheduled for prenatal care Contact information: [place] [phone] [place], [place] 12/21/20 10:19 PM

## 2020-12-21 NOTE — Discharge Instructions (Signed)

## 2020-12-27 ENCOUNTER — Telehealth (INDEPENDENT_AMBULATORY_CARE_PROVIDER_SITE_OTHER): Payer: 59

## 2020-12-27 ENCOUNTER — Ambulatory Visit (INDEPENDENT_AMBULATORY_CARE_PROVIDER_SITE_OTHER): Payer: 59 | Admitting: Primary Care

## 2020-12-27 ENCOUNTER — Telehealth: Payer: 59

## 2020-12-27 DIAGNOSIS — Z136 Encounter for screening for cardiovascular disorders: Secondary | ICD-10-CM

## 2020-12-27 DIAGNOSIS — O0990 Supervision of high risk pregnancy, unspecified, unspecified trimester: Secondary | ICD-10-CM | POA: Insufficient documentation

## 2020-12-27 MED ORDER — BLOOD PRESSURE MONITORING DEVI: 1.0000 | 0 refills | Status: DC

## 2020-12-27 NOTE — Patient Instructions (Signed)
AREA PEDIATRIC/FAMILY PRACTICE PHYSICIANS  [place] ([address]) . [facility] o Chambliss, MD; Eniola, MD; Hale, MD; Hensel, MD; McDiarmid, MD; McIntyer, MD; Amandajo Gonder, MD; Walden, MD o [address] [place], [place] [address] o [phone] o Mon-Fri 8:30-12:30, 1:30-5:00 o Providers come to see babies at [facility] o Accepting Medicaid . [facility] o Limited providers who accept newborns: Koirala, MD; Morrow, MD; Wolters, MD o [address], [place], [place] [address] o [phone] o Mon-Fri 8:00-5:30 o Babies seen by providers at [facility] o Does NOT accept Medicaid o Please call early in hospitalization for appointment (limited availability)  . [facility] o Mulberry, MD o [address] [place], [place] [address] o [phone] o Mon, Tue, Thur, Fri 8:30-5:00, Wed 10:00-7:00 (closed 1-2pm) o Babies seen by [facility] providers o Accepting Medicaid . [facility] Pediatrician o Rubin, MD o [address], [place], [place] [address] o [phone] o Mon-Fri 8:30-5:00, Sat 8:30-12:00 o Provider comes to see babies at [facility] o Accepting Medicaid o Must have been referred from current patients or contacted office prior to delivery . [facility] ([facility]) o Brown, MD; Chandler, MD; Ettefagh, MD; Grant, MD; Lester, MD; McCormick, MD; McQueen, MD; Prose, MD; Simha, MD; Stanley, MD; Stryffeler, NP; Tebben, NP o [address], [place], [place] [address] o [phone] o Mon, Tue, Thur, Fri 8:30-5:30, Wed 9:30-5:30, Sat 8:30-12:30 o Babies seen by [facility] providers o Accepting Medicaid o Only accepting infants of first-time parents or siblings of current patients o Hospital discharge coordinator will make follow-up appointment . Jack Amos o [address],  [place], [place]  [address] o [phone]   Fax - [phone] . [facility] o [address], [place], [place]  [address] o Phone - [phone]   Fax - [phone] . Shilpa Gosrani o [address], [place], [place]  [address] o [phone]  [place] ([address]) . [facility] o Bates, MD; Brassfield, MD; Cooper, Cox, MD; MD; Davis, MD; Dovico, MD; Ettefaugh, MD; Little, MD; Lowe, MD; Keiffer, MD; Melvin, MD; Sumner, MD; Williams, MD o [address], [place], [place] [address] o [phone] o Mon-Fri 8:30-5:00 (extended evenings Mon-Thur as needed), Sat-Sun 10:00-1:00 o Providers come to see babies at [facility] o Accepting Medicaid for families of first-time babies and families with all children in the household age 3 and under. Must register with office prior to making appointment (M-F only). . [facility] o Henson, NP; Knapp, MD; Lalonde, MD; Tysinger, PA o [address] [place], [place] [address] o [phone] o Mon-Fri 8:00-5:00 o Babies seen by providers at [facility] o Does NOT accept Medicaid/Commercial Insurance Only . [facility] ([facility])  o Artis, MD; Barnes, MD; Bratton, MD; Coccaro, MD; Lockett Gardner, MD; Kramer, MD; Marshall, MD; Netherton, MD; Poleto, MD; Skinner, MD o [address] [place],  [address] o [phone] o Mon-Fri 8:30-5:30, Sat (Oct.-Mar.) 9:00-1:00 o Babies seen by providers at [facility] o Accepting Medicaid  [place] ([address]) . [facility] o Reid, MD; Warner, MD o [address], ,  [address] o [phone] o Mon-Fri 8:30-5:00, Sat 8:30-12:00 o Providers come to see babies at [facility] o Does NOT accept Medicaid . [facility] o Becker, PA; Hagler, MD; Scifres, PA; Sun, MD; Swayne, MD o [address],  [place], [place] [address] o [phone] o Mon-Fri 8:00-5:00 o Babies seen by providers at [facility] o Does NOT accept Medicaid o Only accepting babies of parents who are patients o Please call early in hospitalization for appointment (limited availability) . [facility] o Clark, MD; Frye, MD; Kelleher, MD; Mack, NP; Miller, MD; O'Keller, MD; Patterson, NP; Pudlo, MD; Puzio, MD; Thomas, MD; Tucker, MD; Twiselton, MD o [address], [place], [place] [address] o [phone] o Mon-Fri 8:00-5:00, Sat 9:00-12:00 o Providers come to see babies at [facility] o Does NOT accept Medicaid  [place] ([address]) . [facility] o Limited providers accepting new patients: Brake, NP; Wharton, PA o [address], [place], [place] [address] o [phone] o Mon-Fri 8:00-5:00 o Babies seen by providers at [facility] o Does NOT accept Medicaid o Only accepting babies of parents who are patients o Please call early in hospitalization for appointment (limited availability) . [facility] o Gay, MD; Quinlan, MD o [address] [place], [place] [address] o [phone] (press 1 to schedule appointment) o Mon-Fri 8:00-5:00 o Providers come to see babies at [facility] o Does NOT accept Medicaid . [facility] o Mazer, MD o [address] [place], [place] [address] o [phone] o Mon-Fri 8:30-5:00 (lunch 12:30-1:00), extended hours by appointment only Wed 5:00-6:30 o Babies seen by [facility] providers o Accepting Medicaid . [facility] o Banks, MD; Jordan, MD; Koberlein, MD o [address], [place], [place] [address] o [phone] o Mon-Fri 8:00-5:00 o Babies seen by [facility] providers o Does NOT accept Medicaid . [facility] o Parker, MD; Hunter, MD; Wallace, DO o [address] [place], [place]  [address] o [phone] o Mon-Fri 8:00-5:00 o Babies seen by [facility] providers o Does NOT accept Medicaid . [facility] o Brandon, PA; Brecken, PA; Christy, NP; Dees, MD; DeClaire, MD; DeWeese, MD; Hansen, NP; Mills, NP; Parrish, NP; Smoot, NP; Summer, MD; Vapne, MD o [address] [place], [place] [address] o [phone] o Mon-Fri 8:30-5:00, Sat 10:00-1:00 o Providers come to see babies at [facility] o Does NOT accept Medicaid o Free prenatal information session Tuesdays at 4:45pm . [facility] o Bouska, MD; Gordon, PA; Jeffery, PA; Weber, PA o [address] [place] [address] o [phone] o Mon-Fri 7:30-5:30 o Babies seen by [facility] providers . [facility] o [address], [place], [place]  [address] o [phone]   Fax - [phone]  [place] ([address] & [address]) . [facility] o Reese, MD o [address] [place],  [address] o [phone] o Mon-Thur 8:00-6:00 o Providers come to see babies at [facility] o Accepting Medicaid . [facility] o Anderson, NP; Badger, MD; Beal, PA; Spencer, PA o [address] [place],  [address] o [phone] o Mon-Thur 7:30-7:30, Fri 7:30-4:30 o Babies seen by [facility] providers o Accepting Medicaid . [facility] o Agbuya, MD; Klett, NP; Romgoolam, MD o [address], [place],  [address] o [phone] o Mon-Fri 8:30-5:00, Sat 8:30-12:00 o Providers come to see babies at [facility] o Accepting Medicaid o Must have "Meet & Greet" appointment at office prior to delivery . [facility] ([facility] o McCord,   MD; Wallace, MD; Wood, MD o [address], [place], [place] [address] o [phone] o Mon-Wed 8:00-6:00, Thur-Fri 8:00-5:00, Sat 9:00-12:00 o Providers come to  see babies at [facility] o Does NOT accept Medicaid o Only accepting siblings of current patients . [facility]  o [address], [place], [place]  [address] o [phone]   Fax - [phone] . [facility] o [address], [place], [place]  [address] o [phone]   Fax - [phone]  [place] ([address]) . [facility] o Cirigliano, DO; Matthews, DO o [address] [place], [place] [address] o [phone] o Mon-Fri 7:00-5:00 o Babies seen by [facility] providers o Does NOT accept Medicaid . [facility] o Briscoe, MD; Howley, PA; Moreira, PA o [address], [place], [place] [address] o [phone] o Mon-Fri 8:00-5:00 o Babies seen by [facility] providers o Accepting Medicaid . [facility] o Boyd, MD; Church, PA; Jones, NP; Osborn, PA o [address], [place], [place] [address] o [phone] o Mon-Fri 8:00-5:00 o Babies seen by providers at [facility] o Accepting Medicaid  [place] ([address]) . [facility] [place] o Wendling, DO o [address] [place], [place] [address] o [phone] o Mon-Fri 8:00-5:00 o Babies seen by [facility] providers o Does NOT accept Medicaid o Limited availability, please call early in hospitalization to schedule follow-up . [facility] o Calderon, PA; Cummings, MD; Dillard, MD; Martin, PA; Olson, MD; VanDeven, PA o [address], [place], [place] [address] o [phone] o Mon-Fri 8:30-5:00, Sat 9:00-12:00 o Babies seen by providers at [facility] o Accepting Medicaid o Please register online then schedule online or call office o [email or web address] . [facility] ([facility] o Hunter, NP; Kumar, MD; Martin Rogers, PA o [address], [place], [place] [address] o [phone] o Mon-Fri 8:00-5:00 o Babies seen by providers at [facility] o Accepting Medicaid . [facility] ([facility]) o [place], MD; Kristi Fleenor, NP; West, MD o [address], [place], [place] [address] o [phone] o Mon-Fri 8:00-5:30, Sat&Sun by appointment (phones open at 8:30) o Babies seen by [facility] providers o Accepting Medicaid o Must be a first-time baby or sibling of current patient . [facility] [place]  o [address], [place], [place]  [address] o [phone]   Fax - [phone]  [place] ([address]) . [facility] o Brown, PA; Cowen, PA; Rice, MD; Helton, PA; Spry, MD o [address] [place], [place] [address] o [phone] o Mon-Thur 8:00-7:00, Fri 8:00-5:00, Sat 8:00-12:00, Sun 9:00-12:00 o Babies seen by [facility] providers o Accepting Medicaid . [facility] o Coe-Goins, MD; Marshall, MD; Pierre-Louis, MD o [address], [place], [place] [address] o [phone] o Mon-Thur 8:00-5:00 o Babies seen by providers at [facility] o Accepting Medicaid . [facility] o Bratton, MD; Coe-Goins, MD; Hayes, MD; Lewis, MD; List, MD; Lott, MD; Marshall, MD; Moran, MD; O'Shizuko Wojdyla, MD; Pierre-Louis, MD; Pitonzo, MD; Scholer, MD; Spangle, MD o [address] [place],    [address] o [phone] o Mon-Fri 8:00-5:30, Sat (Oct.-Mar.) 9:00-1:00 o Babies seen by providers at [facility] o Accepting Medicaid o Must fill out new patient packet, available online at [email or web address] . [facility] ([facility]) o Friddle, NP; Harris, NP; Kelly, NP; Logan, MD;  Melvin, PA; Poth, MD; Ramadoss, MD; Stanton, NP o [address], [place], [place] [address] o [phone] o Mon-Thur 8:00-5:30, Fri 8:00-5:00 o Babies seen by providers at [facility] o Accepting Medicaid  [place] ([address]) . [facility] o Dixon, PA; Dorado, MD; Pickard, MD; Tapia, PA o [address], [place], [place] [address] o [phone] o Mon-Fri 8:00-5:00 o Babies seen by providers at [facility] o Accepting Medicaid   [place] ([address]) . [facility] [place] o Masneri, DO; Meyers, MD; Nelson, PA o [address], [place], [place] [address] o [phone] o Mon-Fri 8:00-5:00 o Babies seen by providers at [facility] o Does NOT accept Medicaid o Limited appointment availability, please call early in hospitalization  . [facility] [place] o Kunedd, DO; McGowen, MD o [address], [place], [place] [address] o [phone] o Mon-Fri 8:00-5:00 o Babies seen by [facility] providers o Does NOT accept Medicaid . [facility] [place] o Cameron, MD; MacDonald, MD; Michaels, PA; Nayak, MD o [address] [place] [address], [place], [place] [address] o [phone] o Mon-Fri 8:00-5:00 o After hours clinic ([address] [place], [place] [address]) [phone] Mon-Fri 5:00-8:00, Sat 12:00-6:00, Sun 10:00-4:00 o Babies seen by [facility] providers o Accepting Medicaid . [facility] [place] o [address], [place], [place]  [address] o [phone]   Fax - [phone]  [place] ([address]) .  [facility] [place] [facility] o Andy, MD o [address], [place], [place] [address] o [phone] o Mon-Fri 8:00-5:00 o Babies seen by [facility] providers o Does NOT accept Medicaid . [facility] [place] ([facility] [place]) o Eksir, MD o [address], [place], [place]  [address] o [phone] o Mon-Thur 8:00-7:00, Fri 8:00-5:00, Sat 8:00-12:00 o Babies seen by providers at [facility] o Accepting Medicaid - but does not have vaccinations in office (must be received elsewhere) o Limited availability, please call early in hospitalization  [place] ([address]) . [facility]  o Charlene Flemming, MD o [address],   [address] o [phone]  Fax [phone]   

## 2020-12-27 NOTE — Progress Notes (Signed)
New OB Intake  I connected with  Misty [name] on 12/27/20 at  9:15 AM EDT by MyChart Telephone and verified that I am speaking with the correct person using two identifiers. Nurse is located at [facility] and pt is located at home.  I discussed the limitations, risks, security and privacy concerns of performing an evaluation and management service by telephone and the availability of in person appointments. I also discussed with the patient that there may be a patient responsible charge related to this service. The patient expressed understanding and agreed to proceed.  I explained I am completing New OB Intake today. We discussed her EDD of 08/01/21 that is based on U/S on 12/05/20. Pt is G 2/P 0. I reviewed her allergies, medications, Medical/Surgical/OB history, and appropriate screenings. I informed her of [facility] services. Based on history, this is a/an complicated by Type 2 diabetes// Hypertension pregnancy.  Patient Active Problem List   Diagnosis Date Noted  . DKA (diabetic ketoacidosis) (HCC) 09/02/2020  . Abscess 09/02/2020  . Hypertension 09/2013    Concerns addressed today  Delivery Plans:  Plans to deliver at [name].   MyChart/Babyscripts MyChart access verified. I explained pt will have some visits in office and some virtually. Babyscripts instructions given. Account successfully created and app downloaded.  Blood Pressure Cuff Blood pressure cuff ordered for patient to pick-up from [employer]. Explained after first prenatal appt pt will check weekly and document in Babyscripts.  Anatomy US Explained first scheduled [place] will be around 19 weeks. Anatomy US scheduled for 03/07/21 at 10:00a.   Labs Discussed [name] genetic screening with patient. Would like both Panorama and Horizon drawn at new OB visit. Routine prenatal labs needed.  Covid Vaccine Patient has not covid vaccine.   [facility] Referral Patient is interested in referral to [facility].   Pregnancy  Navigator Informed patient a pregnancy navigator will see her at her first ob visit with provider.   [place] appointments Childcare: Informed patient when she has Ultrasound appointments she will not be able to bring child/ children with her to Ultrasound appointments. Asked if childcare would be an issue.   First visit review I reviewed new OB appt with pt. I explained she will have a pelvic exam, ob bloodwork with genetic screening, and PAP smear. Explained pt will be seen by Dr. Crissie Reese at first visit; encounter routed to appropriate provider. If new patient offered monthly Zoom meeting  Misty [name], Misty [name] 12/27/2020  9:27 AM

## 2021-01-03 ENCOUNTER — Encounter: Payer: Self-pay | Admitting: Family Medicine

## 2021-01-03 ENCOUNTER — Other Ambulatory Visit (HOSPITAL_COMMUNITY)
Admission: RE | Admit: 2021-01-03 | Discharge: 2021-01-03 | Disposition: A | Discharge status: Home / Self Care | Payer: 59 | Source: Ambulatory Visit | Attending: Family Medicine | Admitting: Family Medicine

## 2021-01-03 ENCOUNTER — Encounter: Payer: Self-pay | Admitting: *Deleted

## 2021-01-03 ENCOUNTER — Other Ambulatory Visit: Payer: Self-pay

## 2021-01-03 ENCOUNTER — Ambulatory Visit (INDEPENDENT_AMBULATORY_CARE_PROVIDER_SITE_OTHER): Payer: 59 | Admitting: Family Medicine

## 2021-01-03 VITALS — BP 132/89 | HR 63 | Wt 183.8 lb

## 2021-01-03 DIAGNOSIS — O0990 Supervision of high risk pregnancy, unspecified, unspecified trimester: Secondary | ICD-10-CM | POA: Insufficient documentation

## 2021-01-03 DIAGNOSIS — O24112 Pre-existing diabetes mellitus, type 2, in pregnancy, second trimester: Secondary | ICD-10-CM | POA: Insufficient documentation

## 2021-01-03 DIAGNOSIS — O24111 Pre-existing diabetes mellitus, type 2, in pregnancy, first trimester: Secondary | ICD-10-CM

## 2021-01-03 DIAGNOSIS — O10919 Unspecified pre-existing hypertension complicating pregnancy, unspecified trimester: Secondary | ICD-10-CM

## 2021-01-03 LAB — POCT URINALYSIS DIP (DEVICE)
Bilirubin Urine: NEGATIVE
Glucose, UA: 100 mg/dL — AB
Hgb urine dipstick: NEGATIVE
Ketones, ur: NEGATIVE mg/dL
Leukocytes,Ua: NEGATIVE
Nitrite: NEGATIVE
Protein, ur: NEGATIVE mg/dL
Specific Gravity, Urine: >=1.03 (ref 1.005–1.030)
Urobilinogen, UA: 0.2 mg/dL (ref 0.0–1.0)
pH: 6 (ref 5.0–8.0)

## 2021-01-03 MED ORDER — PRENATAL VITAMINS 28-0.8 MG PO TABS
1.0000 | ORAL_TABLET | Freq: Every day | ORAL | 11 refills | Status: DC
Start: 2021-01-03 — End: 2021-06-26

## 2021-01-03 MED ORDER — ASPIRIN EC 81 MG PO TBEC
81.0000 mg | DELAYED_RELEASE_TABLET | Freq: Every day | ORAL | 3 refills | Status: DC
Start: 2021-01-03 — End: 2021-01-12

## 2021-01-03 NOTE — Progress Notes (Signed)
Subjective:   Misty [name] is a 30 y.o. G2P0010 at [redacted]w[redacted]d by early ultrasound being seen today for her first obstetrical visit.  Her obstetrical history is significant for cHTN, T2DM. Patient does intend to breast feed. Pregnancy history fully reviewed.  Patient reports no complaints.  HISTORY: OB History  Gravida Para Term Preterm AB Living  2 0 0 0 1 0  SAB IAB Ectopic Multiple Live Births  1 0 0 0 0    # Outcome Date GA Lbr Len/2nd Weight Sex Delivery Anes PTL Lv  2 Current           1 SAB            Last pap smear was  02/11/2018 and was normal Past Medical History:  Diagnosis Date  . Allergy   . Anemia 2015  . Asthma    as child  . Diabetes mellitus without complication ([place]) 9675  . Eczema   . Hypertension 09/2013   Past Surgical History:  Procedure Laterality Date  . Abcess on back    . NO PAST SURGERIES     Family History  Problem Relation Age of Onset  . Hypertension Mother   . Heart disease Mother 52       CHF  . Diabetes Mother   . Sickle cell anemia Brother   . Hypertension Maternal Grandfather   . Diabetes Maternal Grandfather   . Heart disease Maternal Grandfather   . Healthy Father   . Diabetes Maternal Grandmother   . Heart disease Maternal Grandmother    Social History   Tobacco Use  . Smoking status: Former Smoker    Packs/day: 0.50    Years: 2.00    Pack years: 1.00    Quit date: 09/08/2013    Years since quitting: 7.3  . Smokeless tobacco: Never Used  Vaping Use  . Vaping Use: Never used  Substance Use Topics  . Alcohol use: No    Alcohol/week: 0.0 standard drinks  . Drug use: No   Allergies  Allergen Reactions  . Iodides Anaphylaxis and Itching  . Morphine And Related Anaphylaxis and Itching  . Shellfish Allergy Anaphylaxis and Itching    Pt reports "watery eyes"  . Ultram [Tramadol Hcl] Hives    Hives and swollen lips    Current Outpatient Medications on File Prior to Visit  Medication Sig Dispense Refill  . insulin  aspart (NOVOLOG) 100 UNIT/ML FlexPen Inject 20 Units into the skin 3 (three) times daily with meals. For blood sugars 0-150 give 0 units of insulin, 151-200 give 2 units of insulin, 201-250 give 4 units, 251-300 give 6 units, 301-350 give 8 units, 351-400 give 10 units,> 400 give 12 units and call M.D. Discussed hypoglycemia protocol. 15 mL 1  . insulin glargine (LANTUS) 100 UNIT/ML injection Inject 18 Units into the skin 3 (three) times daily.    . Insulin Pen Needle 31G X 5 MM MISC 1 Device by Does not apply route 4 (four) times daily. For use with insulin pens 100 each 11  . Prenatal MV & Min w/FA-DHA (PRENATAL GUMMIES PO) Take by mouth.    . blood glucose meter kit and supplies KIT Dispense based on patient and insurance preference. Use up to four times daily as directed. (FOR ICD-9 250.00, 250.01). 1 each 0  . blood glucose meter kit and supplies Dispense based on patient and insurance preference. Use up to four times daily as directed. (FOR ICD-10 E10.9, E11.9). 1  each 0  . Blood Pressure Monitoring DEVI 1 each by Does not apply route once a week. 1 each 0  . cyclobenzaprine (FLEXERIL) 10 MG tablet Take 1 tablet (10 mg total) by mouth 3 (three) times daily as needed for muscle spasms. (Patient not taking: Reported on 12/27/2020) 60 tablet 1  . famotidine (PEPCID) 20 MG tablet Take 1 tablet (20 mg total) by mouth 2 (two) times daily. (Patient not taking: No sig reported) 60 tablet 1  . labetalol (NORMODYNE) 200 MG tablet Take 1 tablet (200 mg total) by mouth 2 (two) times daily. (Patient not taking: No sig reported) 60 tablet 1  . ondansetron (ZOFRAN ODT) 4 MG disintegrating tablet Take 1 tablet (4 mg total) by mouth every 8 (eight) hours as needed for nausea or vomiting. (Patient not taking: No sig reported) 30 tablet 2  . promethazine (PHENERGAN) 25 MG tablet Take 0.5-1 tablets (12.5-25 mg total) by mouth every 6 (six) hours as needed for nausea or vomiting. (Patient not taking: No sig reported) 30  tablet 0  . [DISCONTINUED] cetirizine (ZYRTEC) 10 MG tablet Take 1 tablet (10 mg total) by mouth daily. (Patient not taking: Reported on 02/25/2020) 30 tablet 0  . [DISCONTINUED] dicyclomine (BENTYL) 20 MG tablet Take 1 tablet (20 mg total) by mouth 2 (two) times daily. 20 tablet 0  . [DISCONTINUED] fluticasone (FLONASE) 50 MCG/ACT nasal spray Place 2 sprays into both nostrils daily. 1 g 1  . [DISCONTINUED] ipratropium (ATROVENT) 0.06 % nasal spray Place 2 sprays into both nostrils 4 (four) times daily. 15 mL 1  . [DISCONTINUED] omeprazole (PRILOSEC) 20 MG capsule Take 1 capsule (20 mg total) by mouth daily. 14 capsule 0   No current facility-administered medications on file prior to visit.     Exam   Vitals:   01/03/21 0939  BP: 132/89  Pulse: 63  Weight: 183 lb 12.8 oz (83.4 kg)   Fetal Heart Rate (bpm): 160  System: General: well-developed, well-nourished female in no acute distress   Skin: normal coloration and turgor, no rashes   Neurologic: oriented, normal, negative, normal mood   Extremities: normal strength, tone, and muscle mass, ROM of all joints is normal   HEENT PERRLA, extraocular movement intact and sclera clear, anicteric   Mouth/Teeth mucous membranes moist, pharynx normal without lesions and dental hygiene good   Neck supple and no masses   Respiratory:  no respiratory distress     Assessment:   Pregnancy: G2P0010 Patient Active Problem List   Diagnosis Date Noted  . Type 2 diabetes mellitus affecting pregnancy in first trimester, antepartum 01/03/2021  . Supervision of high risk pregnancy, antepartum 12/27/2020  . DKA (diabetic ketoacidosis) ([place]) 09/02/2020  . Abscess 09/02/2020  . Chronic hypertension affecting pregnancy 09/2013     Plan:  1. Supervision of high risk pregnancy, antepartum Initial labs drawn. Continue prenatal vitamins. Genetic Screening discussed, NIPS: ordered. Ultrasound discussed; fetal anatomic survey: ordered. Problem list  reviewed and updated. The nature of [place] with multiple MDs and other Advanced Practice Providers was explained to patient; also emphasized that residents, students are part of our team. - Genetic Screening - CBC/D/Plt+RPR+Rh+ABO+Rub Ab... - GC/Chlamydia probe amp ([place])not at [facility] - Culture, OB Urine - TSH - Protein / creatinine ratio, urine - Comprehensive metabolic panel  2. Type 2 diabetes mellitus affecting pregnancy in first trimester, antepartum Currently on onerous regimen of lantus 18u TID and novolog 20u TID, has not been checking  sugurs Asked her to check QID, we will coordinate an insulin pump as this would be ideal given her insulin requirements Last A1c about a month ago was 9% Order fetal echo at next visit Start baby ASA at 13 weeks Insulin pump recs per pharmacist: TDD of insulin 114 units >> reduce by ~25% when switching to insulin pump, new TDD ~86 units Basal rate should be 1.8 units per hour carb ratio = 5.8 sensitivity factor = 1.2 mmol/L  3. Chronic hypertension affecting pregnancy BP's mild range in the past, normal today Not taking labetalol Discussed that any elevated BP's will need to be treated, she is in agreement with this plan   Routine obstetric precautions reviewed. Return in 1 week (on 01/10/2021) for [facility], needs MD, review sugar log, coordinate insulin pump.

## 2021-01-03 NOTE — Patient Instructions (Signed)
 Obstetrics: Normal and Problem Pregnancies (7th ed., pp. 102-121). [place], [place]: Elsevier."> Textbook of Family Medicine (9th ed., pp. 365-410). [place], [place]: [employer].">  First Trimester of Pregnancy  The first trimester of pregnancy starts on the first day of your last menstrual period until the end of week 12. This is months 1 through 3 of pregnancy. A week after a sperm fertilizes an egg, the egg will implant into the wall of the uterus and begin to develop into a baby. By the end of 12 weeks, all the baby's organs will be formed and the baby will be 2-3 inches in size. Body changes during your first trimester Your body goes through many changes during pregnancy. The changes vary and generally return to normal after your baby is born. Physical changes  You may gain or lose weight.  Your breasts may begin to grow larger and become tender. The tissue that surrounds your nipples (areola) may become darker.  Dark spots or blotches (chloasma or mask of pregnancy) may develop on your face.  You may have changes in your hair. These can include thickening or thinning of your hair or changes in texture. Health changes  You may feel nauseous, and you may vomit.  You may have heartburn.  You may develop headaches.  You may develop constipation.  Your gums may bleed and may be sensitive to brushing and flossing. Other changes  You may tire easily.  You may urinate more often.  Your menstrual periods will stop.  You may have a loss of appetite.  You may develop cravings for certain kinds of food.  You may have changes in your emotions from day to day.  You may have more vivid and strange dreams. Follow these instructions at home: Medicines  Follow your health care provider's instructions regarding medicine use. Specific medicines may be either safe or unsafe to take during pregnancy. Do not take any medicines unless told to by your health care provider.  Take  a prenatal vitamin that contains at least 600 micrograms (mcg) of folic acid. Eating and drinking  Eat a healthy diet that includes fresh fruits and vegetables, whole grains, good sources of protein such as meat, eggs, or tofu, and low-fat dairy products.  Avoid raw meat and unpasteurized juice, milk, and cheese. These carry germs that can harm you and your baby.  If you feel nauseous or you vomit: ? Eat 4 or 5 small meals a day instead of 3 large meals. ? Try eating a few soda crackers. ? Drink liquids between meals instead of during meals.  You may need to take these actions to prevent or treat constipation: ? Drink enough fluid to keep your urine pale yellow. ? Eat foods that are high in fiber, such as beans, whole grains, and fresh fruits and vegetables. ? Limit foods that are high in fat and processed sugars, such as fried or sweet foods. Activity  Exercise only as directed by your health care provider. Most people can continue their usual exercise routine during pregnancy. Try to exercise for 30 minutes at least 5 days a week.  Stop exercising if you develop pain or cramping in the lower abdomen or lower back.  Avoid exercising if it is very hot or humid or if you are at high altitude.  Avoid heavy lifting.  If you choose to, you may have sex unless your health care provider tells you not to. Relieving pain and discomfort  Wear a good support bra to relieve   breast tenderness.  Rest with your legs elevated if you have leg cramps or low back pain.  If you develop bulging veins (varicose veins) in your legs: ? Wear support hose as told by your health care provider. ? Elevate your feet for 15 minutes, 3-4 times a day. ? Limit salt in your diet. Safety  Wear your seat belt at all times when driving or riding in a car.  Talk with your health care provider if someone is verbally or physically abusive to you.  Talk with your health care provider if you are feeling sad or have  thoughts of hurting yourself. Lifestyle  Do not use hot tubs, steam rooms, or saunas.  Do not douche. Do not use tampons or scented sanitary pads.  Do not use herbal remedies, alcohol, illegal drugs, or medicines that are not approved by your health care provider. Chemicals in these products can harm your baby.  Do not use any products that contain nicotine or tobacco, such as cigarettes, e-cigarettes, and chewing tobacco. If you need help quitting, ask your health care provider.  Avoid cat litter boxes and soil used by cats. These carry germs that can cause birth defects in the baby and possibly loss of the unborn baby (fetus) by miscarriage or stillbirth. General instructions  During routine prenatal visits in the first trimester, your health care provider will do a physical exam, perform necessary tests, and ask you how things are going. Keep all follow-up visits. This is important.  Ask for help if you have counseling or nutritional needs during pregnancy. Your health care provider can offer advice or refer you to specialists for help with various needs.  Schedule a dentist appointment. At home, brush your teeth with a soft toothbrush. Floss gently.  Write down your questions. Take them to your prenatal visits. Where to find more information  American Pregnancy Association: americanpregnancy.org  [employer]st[employer]ians [employer] Gynecologists: https://www.todd-brady.net/  Office on [employer] Health: MightyReward.co.nz Contact a health care provider if you have:  Dizziness.  A fever.  Mild pelvic cramps, pelvic pressure, or nagging pain in the abdominal area.  Nausea, vomiting, or diarrhea that lasts for 24 hours or longer.  A bad-smelling vaginal discharge.  Pain when you urinate.  Known exposure to a contagious illness, such as chickenpox, measles, Zika virus, HIV, or hepatitis. Get help right away if you have:  Spotting or bleeding from your  vagina.  Severe abdominal cramping or pain.  Shortness of breath or chest pain.  Any kind of trauma, such as from a fall or a car crash.  New or increased pain, swelling, or redness in an arm or leg. Summary  The first trimester of pregnancy starts on the first day of your last menstrual period until the end of week 12 (months 1 through 3).  Eating 4 or 5 small meals a day rather than 3 large meals may help to relieve nausea and vomiting.  Do not use any products that contain nicotine or tobacco, such as cigarettes, e-cigarettes, and chewing tobacco. If you need help quitting, ask your health care provider.  Keep all follow-up visits. This is important. This information is not intended to replace advice given to you by your health care provider. Make sure you discuss any questions you have with your health care provider. Document Revised: 01/27/2020 Document Reviewed: 12/03/2019 Elsevier Patient Education  2021 Elsevier Inc.   http://clinicalkey.com">  Gestational Diabetes Mellitus, Diagnosis  Gestational diabetes mellitus, or gestational diabetes, is a form  of diabetes that some women get during pregnancy. To control blood sugar (glucose) in the body, the pancreas makes a hormone called insulin. This hormone allows glucose to enter the cells in the body. The cells use glucose for energy. However, for some pregnant women, the pancreas may not make enough insulin or the body may not use available insulin properly. This leads to gestational diabetes. Gestational diabetes lasts only a short time. It usually happens at weeks 24-28 of pregnancy and goes away after delivery. However, women who get gestational diabetes are more likely to:  Get it again if they become pregnant.  Develop type 2 diabetes in the future. Gestational diabetes is not likely to cause problems if it is treated. If not controlled with treatment, it may cause problems during labor and delivery. Some problems can harm  the baby and the mother. What are the causes? This condition occurs during pregnancy when the woman's body makes growth hormones that help the baby grow. Sometimes, these hormones:  Cause the pancreas to work harder than normal to make more insulin. Sometimes, the pancreas still cannot make enough insulin.  Cause insulin resistance. This makes it hard for the cells to use insulin properly. Insulin resistance or lack of insulin causes extra glucose to build up in the blood instead of going into cells. This leads to high blood glucose (hyperglycemia). What increases the risk? This condition is more likely to develop in pregnant women who:  Are older than age 64 during pregnancy.  Have a family history of diabetes.  Are overweight.  Have had gestational diabetes in the past.  Have polycystic ovary syndrome (PCOS).  Are pregnant with twins or other multiples. What are the signs or symptoms? Common symptoms of this condition include:  Increased thirst.  Increased hunger.  Needing to urinate more often. Most women do not notice these symptoms because the symptoms are similar to other symptoms of pregnancy. How is this diagnosed? This condition may be diagnosed based on your blood glucose level. This may be checked:  After you fast for 8 hours or longer (fasting blood glucose test, or FBG).  Any time of day, no matter when you eat (random glucose test).  At weeks 24-28 of pregnancy, to check how your body responds to glucose (oral glucose tolerance test, or OGTT). If you have risk factors, you may be screened for type 2 diabetes at your first health care visit during your pregnancy. How is this treated? Treatment for this condition depends on the stage of your pregnancy and any other medical conditions you may have. Treatment may include:  Eating a healthy diet and getting more physical activity. These are the most important ways to manage gestational diabetes.  Checking your  blood glucose as often as told.  Taking insulin or other diabetes medicines every day. These medicines will be prescribed only if needed. You may need to work with a diabetes specialist, or endocrinologist, on a treatment plan. The goal of treatment is to have the right blood glucose levels during your pregnancy. The levels are checked while you are fasting and after you eat. Your health care provider will tell you what those levels are. Follow these instructions at home: Learn about your condition Learn as much as you can about your condition. Ask your health care provider:  How often should I check my blood glucose, and where do I get the equipment?  What diabetes medicines do I need, and when should I take them?  Do I  need to meet with a certified diabetes care and education specialist?  What number can I call if I have questions?  Where can I find a support group for people with gestational diabetes? General instructions  Take over-the-counter and prescription medicines only as told by your health care provider.  Manage your weight gain during pregnancy. Your expected weight gain depends on your BMI (body mass index) before pregnancy.  Drink enough fluid to keep your urine pale yellow.  Carry a medical alert card or wear medical alert jewelry that says you have gestational diabetes.  Keep all follow-up visits. This is important. Where to find more information  American Diabetes Association (ADA): diabetes.org  Association of Diabetes Care & Education Specialists (ADCES): diabeteseducator.org  Centers for Disease Control and Prevention (CDC): TonerPromos.no  American Pregnancy Association: americanpregnancy.org  [place] Department of Agriculture MyPlate: WrestlingReporter.dk Contact a health care provider if you:  Have a blood glucose level at or above: ? 240 mg/dL (57.8 mmol/L). ? 200 mg/dL (46.9 mmol/L), and you have ketones in your urine. Ketones are made by the liver when a lack of  glucose forces the body to use fat for energy.  Have a fever.  Have been sick for 2 days or more and are not getting better.  Have either of these problems for more than 6 hours: ? Vomiting every time you eat or drink. ? Diarrhea. Get help right away if you:  Become confused or cannot think clearly.  Have trouble breathing.  Have moderate or high ketone levels in your urine.  Feel that your baby is moving around less than usual.  Develop unusual discharge or bleeding from your vagina.  Start having early (premature) contractions. Contractions may feel like a tightening in your lower abdomen.  Have a severe headache. These symptoms may represent a serious problem that is an emergency. Do not wait to see if the symptoms will go away. Get medical help right away. Call your local emergency services (911 in the [place] Do not drive yourself to the hospital. Summary  Gestational diabetes mellitus is a form of diabetes that some women get during pregnancy. It usually happens at weeks 24-28 of pregnancy and goes away after delivery.  Treatment may include eating a healthy diet and getting more physical activity. You may also be given insulin or other diabetes medicines.  Contact a health care provider if your glucose levels reach 240 mg/dL (62.9 mmol/L) or higher.  Get help right away if you become confused or cannot think clearly, or you feel that your baby is moving around less than usual. This information is not intended to replace advice given to you by your health care provider. Make sure you discuss any questions you have with your health care provider. Document Revised: 01/25/2020 Document Reviewed: 01/25/2020 Elsevier Patient Education  2021 [employer].   Contraception Choices Contraception, also called birth control, refers to methods or devices that prevent pregnancy. Hormonal methods Contraceptive implant A contraceptive implant is a thin, plastic tube that contains a  hormone that prevents pregnancy. It is different from an intrauterine device (IUD). It is inserted into the upper part of the arm by a health care provider. Implants can be effective for up to 3 years. Progestin-only injections Progestin-only injections are injections of progestin, a synthetic form of the hormone progesterone. They are given every 3 months by a health care provider. Birth control pills Birth control pills are pills that contain hormones that prevent pregnancy. They must be taken once  a day, preferably at the same time each day. A prescription is needed to use this method of contraception. Birth control patch The birth control patch contains hormones that prevent pregnancy. It is placed on the skin and must be changed once a week for three weeks and removed on the fourth week. A prescription is needed to use this method of contraception. Vaginal ring A vaginal ring contains hormones that prevent pregnancy. It is placed in the vagina for three weeks and removed on the fourth week. After that, the process is repeated with a new ring. A prescription is needed to use this method of contraception. Emergency contraceptive Emergency contraceptives prevent pregnancy after unprotected sex. They come in pill form and can be taken up to 5 days after sex. They work best the sooner they are taken after having sex. Most emergency contraceptives are available without a prescription. This method should not be used as your only form of birth control.   Barrier methods Female condom A female condom is a thin sheath that is worn over the penis during sex. Condoms keep sperm from going inside a woman's body. They can be used with a sperm-killing substance (spermicide) to increase their effectiveness. They should be thrown away after one use. Female condom A female condom is a soft, loose-fitting sheath that is put into the vagina before sex. The condom keeps sperm from going inside a woman's body. They should  be thrown away after one use. Diaphragm A diaphragm is a soft, dome-shaped barrier. It is inserted into the vagina before sex, along with a spermicide. The diaphragm blocks sperm from entering the uterus, and the spermicide kills sperm. A diaphragm should be left in the vagina for 6-8 hours after sex and removed within 24 hours. A diaphragm is prescribed and fitted by a health care provider. A diaphragm should be replaced every 1-2 years, after giving birth, after gaining more than 15 lb (6.8 kg), and after pelvic surgery. Cervical cap A cervical cap is a round, soft latex or plastic cup that fits over the cervix. It is inserted into the vagina before sex, along with spermicide. It blocks sperm from entering the uterus. The cap should be left in place for 6-8 hours after sex and removed within 48 hours. A cervical cap must be prescribed and fitted by a health care provider. It should be replaced every 2 years. Sponge A sponge is a soft, circular piece of polyurethane foam with spermicide in it. The sponge helps block sperm from entering the uterus, and the spermicide kills sperm. To use it, you make it wet and then insert it into the vagina. It should be inserted before sex, left in for at least 6 hours after sex, and removed and thrown away within 30 hours. Spermicides Spermicides are chemicals that kill or block sperm from entering the cervix and uterus. They can come as a cream, jelly, suppository, foam, or tablet. A spermicide should be inserted into the vagina with an applicator at least 10-15 minutes before sex to allow time for it to work. The process must be repeated every time you have sex. Spermicides do not require a prescription.   Intrauterine contraception Intrauterine device (IUD) An IUD is a T-shaped device that is put in a woman's uterus. There are two types:  Hormone IUD.This type contains progestin, a synthetic form of the hormone progesterone. This type can stay in place for 3-5  years.  Copper IUD.This type is wrapped in copper wire. It can  stay in place for 10 years. Permanent methods of contraception Female tubal ligation In this method, a woman's fallopian tubes are sealed, tied, or blocked during surgery to prevent eggs from traveling to the uterus. Hysteroscopic sterilization In this method, a small, flexible insert is placed into each fallopian tube. The inserts cause scar tissue to form in the fallopian tubes and block them, so sperm cannot reach an egg. The procedure takes about 3 months to be effective. Another form of birth control must be used during those 3 months. Female sterilization This is a procedure to tie off the tubes that carry sperm (vasectomy). After the procedure, the man can still ejaculate fluid (semen). Another form of birth control must be used for 3 months after the procedure. Natural planning methods Natural family planning In this method, a couple does not have sex on days when the woman could become pregnant. Calendar method In this method, the woman keeps track of the length of each menstrual cycle, identifies the days when pregnancy can happen, and does not have sex on those days. Ovulation method In this method, a couple avoids sex during ovulation. Symptothermal method This method involves not having sex during ovulation. The woman typically checks for ovulation by watching changes in her temperature and in the consistency of cervical mucus. Post-ovulation method In this method, a couple waits to have sex until after ovulation. Where to find more information  Centers for Disease Control and Prevention: FootballExhibition.com.br Summary  Contraception, also called birth control, refers to methods or devices that prevent pregnancy.  Hormonal methods of contraception include implants, injections, pills, patches, vaginal rings, and emergency contraceptives.  Barrier methods of contraception can include female condoms, female condoms, diaphragms,  cervical caps, sponges, and spermicides.  There are two types of IUDs (intrauterine devices). An IUD can be put in a woman's uterus to prevent pregnancy for 3-5 years.  Permanent sterilization can be done through a procedure for males and females. Natural family planning methods involve nothaving sex on days when the woman could become pregnant. This information is not intended to replace advice given to you by your health care provider. Make sure you discuss any questions you have with your health care provider. Document Revised: 01/25/2020 Document Reviewed: 01/25/2020 Elsevier Patient Education  2021 Elsevier Inc.   Breastfeeding  Choosing to breastfeed is one of the best decisions you can make for yourself and your baby. A change in hormones during pregnancy causes your breasts to make breast milk in your milk-producing glands. Hormones prevent breast milk from being released before your baby is born. They also prompt milk flow after birth. Once breastfeeding has begun, thoughts of your baby, as well as his or her sucking or crying, can stimulate the release of milk from your milk-producing glands. Benefits of breastfeeding Research shows that breastfeeding offers many health benefits for infants and mothers. It also offers a cost-free and convenient way to feed your baby. For your baby  Your first milk (colostrum) helps your baby's digestive system to function better.  Special cells in your milk (antibodies) help your baby to fight off infections.  Breastfed babies are less likely to develop asthma, allergies, obesity, or type 2 diabetes. They are also at lower risk for sudden infant death syndrome (SIDS).  Nutrients in breast milk are better able to meet your baby's needs compared to infant formula.  Breast milk improves your baby's brain development. For you  Breastfeeding helps to create a very special bond between you and  your baby.  Breastfeeding is convenient. Breast milk  costs nothing and is always available at the correct temperature.  Breastfeeding helps to burn calories. It helps you to lose the weight that you gained during pregnancy.  Breastfeeding makes your uterus return faster to its size before pregnancy. It also slows bleeding (lochia) after you give birth.  Breastfeeding helps to lower your risk of developing type 2 diabetes, osteoporosis, rheumatoid arthritis, cardiovascular disease, and breast, ovarian, uterine, and endometrial cancer later in life. Breastfeeding basics Starting breastfeeding  Find a comfortable place to sit or lie down, with your neck and back well-supported.  Place a pillow or a rolled-up blanket under your baby to bring him or her to the level of your breast (if you are seated). Nursing pillows are specially designed to help support your arms and your baby while you breastfeed.  Make sure that your baby's tummy (abdomen) is facing your abdomen.  Gently massage your breast. With your fingertips, massage from the outer edges of your breast inward toward the nipple. This encourages milk flow. If your milk flows slowly, you may need to continue this action during the feeding.  Support your breast with 4 fingers underneath and your thumb above your nipple (make the letter "C" with your hand). Make sure your fingers are well away from your nipple and your baby's mouth.  Stroke your baby's lips gently with your finger or nipple.  When your baby's mouth is open wide enough, quickly bring your baby to your breast, placing your entire nipple and as much of the areola as possible into your baby's mouth. The areola is the colored area around your nipple. ? More areola should be visible above your baby's upper lip than below the lower lip. ? Your baby's lips should be opened and extended outward (flanged) to ensure an adequate, comfortable latch. ? Your baby's tongue should be between his or her lower gum and your breast.  Make sure  that your baby's mouth is correctly positioned around your nipple (latched). Your baby's lips should create a seal on your breast and be turned out (everted).  It is common for your baby to suck about 2-3 minutes in order to start the flow of breast milk. Latching Teaching your baby how to latch onto your breast properly is very important. An improper latch can cause nipple pain, decreased milk supply, and poor weight gain in your baby. Also, if your baby is not latched onto your nipple properly, he or she may swallow some air during feeding. This can make your baby fussy. Burping your baby when you switch breasts during the feeding can help to get rid of the air. However, teaching your baby to latch on properly is still the best way to prevent fussiness from swallowing air while breastfeeding. Signs that your baby has successfully latched onto your nipple  Silent tugging or silent sucking, without causing you pain. Infant's lips should be extended outward (flanged).  Swallowing heard between every 3-4 sucks once your milk has started to flow (after your let-down milk reflex occurs).  Muscle movement above and in front of his or her ears while sucking. Signs that your baby has not successfully latched onto your nipple  Sucking sounds or smacking sounds from your baby while breastfeeding.  Nipple pain. If you think your baby has not latched on correctly, slip your finger into the corner of your baby's mouth to break the suction and place it between your baby's gums. Attempt to start  breastfeeding again. Signs of successful breastfeeding Signs from your baby  Your baby will gradually decrease the number of sucks or will completely stop sucking.  Your baby will fall asleep.  Your baby's body will relax.  Your baby will retain a small amount of milk in his or her mouth.  Your baby will let go of your breast by himself or herself. Signs from you  Breasts that have increased in firmness,  weight, and size 1-3 hours after feeding.  Breasts that are softer immediately after breastfeeding.  Increased milk volume, as well as a change in milk consistency and color by the fifth day of breastfeeding.  Nipples that are not sore, cracked, or bleeding. Signs that your baby is getting enough milk  Wetting at least 1-2 diapers during the first 24 hours after birth.  Wetting at least 5-6 diapers every 24 hours for the first week after birth. The urine should be clear or pale yellow by the age of 5 days.  Wetting 6-8 diapers every 24 hours as your baby continues to grow and develop.  At least 3 stools in a 24-hour period by the age of 5 days. The stool should be soft and yellow.  At least 3 stools in a 24-hour period by the age of 7 days. The stool should be seedy and yellow.  No loss of weight greater than 10% of birth weight during the first 3 days of life.  Average weight gain of 4-7 oz (113-198 g) per week after the age of 4 days.  Consistent daily weight gain by the age of 5 days, without weight loss after the age of 2 weeks. After a feeding, your baby may spit up a small amount of milk. This is normal. Breastfeeding frequency and duration Frequent feeding will help you make more milk and can prevent sore nipples and extremely full breasts (breast engorgement). Breastfeed when you feel the need to reduce the fullness of your breasts or when your baby shows signs of hunger. This is called "breastfeeding on demand." Signs that your baby is hungry include:  Increased alertness, activity, or restlessness.  Movement of the head from side to side.  Opening of the mouth when the corner of the mouth or cheek is stroked (rooting).  Increased sucking sounds, smacking lips, cooing, sighing, or squeaking.  Hand-to-mouth movements and sucking on fingers or hands.  Fussing or crying. Avoid introducing a pacifier to your baby in the first 4-6 weeks after your baby is born. After this  time, you may choose to use a pacifier. Research has shown that pacifier use during the first year of a baby's life decreases the risk of sudden infant death syndrome (SIDS). Allow your baby to feed on each breast as long as he or she wants. When your baby unlatches or falls asleep while feeding from the first breast, offer the second breast. Because newborns are often sleepy in the first few weeks of life, you may need to awaken your baby to get him or her to feed. Breastfeeding times will vary from baby to baby. However, the following rules can serve as a guide to help you make sure that your baby is properly fed:  Newborns (babies 67 weeks of age or younger) may breastfeed every 1-3 hours.  Newborns should not go without breastfeeding for longer than 3 hours during the day or 5 hours during the night.  You should breastfeed your baby a minimum of 8 times in a 24-hour period. Breast milk  pumping Pumping and storing breast milk allows you to make sure that your baby is exclusively fed your breast milk, even at times when you are unable to breastfeed. This is especially important if you go back to work while you are still breastfeeding, or if you are not able to be present during feedings. Your lactation consultant can help you find a method of pumping that works best for you and give you guidelines about how long it is safe to store breast milk.      Caring for your breasts while you breastfeed Nipples can become dry, cracked, and sore while breastfeeding. The following recommendations can help keep your breasts moisturized and healthy:  Avoid using soap on your nipples.  Wear a supportive bra designed especially for nursing. Avoid wearing underwire-style bras or extremely tight bras (sports bras).  Air-dry your nipples for 3-4 minutes after each feeding.  Use only cotton bra pads to absorb leaked breast milk. Leaking of breast milk between feedings is normal.  Use lanolin on your nipples  after breastfeeding. Lanolin helps to maintain your skin's normal moisture barrier. Pure lanolin is not harmful (not toxic) to your baby. You may also hand express a few drops of breast milk and gently massage that milk into your nipples and allow the milk to air-dry. In the first few weeks after giving birth, some women experience breast engorgement. Engorgement can make your breasts feel heavy, warm, and tender to the touch. Engorgement peaks within 3-5 days after you give birth. The following recommendations can help to ease engorgement:  Completely empty your breasts while breastfeeding or pumping. You may want to start by applying warm, moist heat (in the shower or with warm, water-soaked hand towels) just before feeding or pumping. This increases circulation and helps the milk flow. If your baby does not completely empty your breasts while breastfeeding, pump any extra milk after he or she is finished.  Apply ice packs to your breasts immediately after breastfeeding or pumping, unless this is too uncomfortable for you. To do this: ? Put ice in a plastic bag. ? Place a towel between your skin and the bag. ? Leave the ice on for 20 minutes, 2-3 times a day.  Make sure that your baby is latched on and positioned properly while breastfeeding. If engorgement persists after 48 hours of following these recommendations, contact your health care provider or a Advertising copywriter. Overall health care recommendations while breastfeeding  Eat 3 healthy meals and 3 snacks every day. Well-nourished mothers who are breastfeeding need an additional 450-500 calories a day. You can meet this requirement by increasing the amount of a balanced diet that you eat.  Drink enough water to keep your urine pale yellow or clear.  Rest often, relax, and continue to take your prenatal vitamins to prevent fatigue, stress, and low vitamin and mineral levels in your body (nutrient deficiencies).  Do not use any products  that contain nicotine or tobacco, such as cigarettes and e-cigarettes. Your baby may be harmed by chemicals from cigarettes that pass into breast milk and exposure to secondhand smoke. If you need help quitting, ask your health care provider.  Avoid alcohol.  Do not use illegal drugs or marijuana.  Talk with your health care provider before taking any medicines. These include over-the-counter and prescription medicines as well as vitamins and herbal supplements. Some medicines that may be harmful to your baby can pass through breast milk.  It is possible to become pregnant while breastfeeding.  If birth control is desired, ask your health care provider about options that will be safe while breastfeeding your baby. Where to find more information: [employer]: www.llli.org Contact a health care provider if:  You feel like you want to stop breastfeeding or have become frustrated with breastfeeding.  Your nipples are cracked or bleeding.  Your breasts are red, tender, or warm.  You have: ? Painful breasts or nipples. ? A swollen area on either breast. ? A fever or chills. ? Nausea or vomiting. ? Drainage other than breast milk from your nipples.  Your breasts do not become full before feedings by the fifth day after you give birth.  You feel sad and depressed.  Your baby is: ? Too sleepy to eat well. ? Having trouble sleeping. ? More than 73 week old and wetting fewer than 6 diapers in a 24-hour period. ? Not gaining weight by 104 days of age.  Your baby has fewer than 3 stools in a 24-hour period.  Your baby's skin or the white parts of his or her eyes become yellow. Get help right away if:  Your baby is overly tired (lethargic) and does not want to wake up and feed.  Your baby develops an unexplained fever. Summary  Breastfeeding offers many health benefits for infant and mothers.  Try to breastfeed your infant when he or she shows early signs of  hunger.  Gently tickle or stroke your baby's lips with your finger or nipple to allow the baby to open his or her mouth. Bring the baby to your breast. Make sure that much of the areola is in your baby's mouth. Offer one side and burp the baby before you offer the other side.  Talk with your health care provider or lactation consultant if you have questions or you face problems as you breastfeed. This information is not intended to replace advice given to you by your health care provider. Make sure you discuss any questions you have with your health care provider. Document Revised: 11/14/2017 Document Reviewed: 09/21/2016 Elsevier Patient Education  2021 [employer].

## 2021-01-04 DIAGNOSIS — Z136 Encounter for screening for cardiovascular disorders: Secondary | ICD-10-CM | POA: Diagnosis not present

## 2021-01-04 LAB — COMPREHENSIVE METABOLIC PANEL WITH GFR
ALT: 12 IU/L (ref 0–32)
AST: 12 IU/L (ref 0–40)
Albumin/Globulin Ratio: 1.6 (ref 1.2–2.2)
Albumin: 4.4 g/dL (ref 3.9–5.0)
Alkaline Phosphatase: 66 IU/L (ref 44–121)
BUN/Creatinine Ratio: 8 — ABNORMAL LOW (ref 9–23)
BUN: 5 mg/dL — ABNORMAL LOW (ref 6–20)
Bilirubin Total: 0.4 mg/dL (ref 0.0–1.2)
CO2: 20 mmol/L (ref 20–29)
Calcium: 9.3 mg/dL (ref 8.7–10.2)
Chloride: 100 mmol/L (ref 96–106)
Creatinine, Ser: 0.62 mg/dL (ref 0.57–1.00)
Globulin, Total: 2.7 g/dL (ref 1.5–4.5)
Glucose: 152 mg/dL — ABNORMAL HIGH (ref 65–99)
Potassium: 4.2 mmol/L (ref 3.5–5.2)
Sodium: 135 mmol/L (ref 134–144)
Total Protein: 7.1 g/dL (ref 6.0–8.5)
eGFR: 123 mL/min/1.73

## 2021-01-04 LAB — GC/CHLAMYDIA PROBE AMP (~~LOC~~) NOT AT ARMC
Chlamydia: NEGATIVE
Comment: NEGATIVE
Comment: NORMAL
Neisseria Gonorrhea: NEGATIVE

## 2021-01-04 LAB — PROTEIN / CREATININE RATIO, URINE
Creatinine, Urine: 156.5 mg/dL
Protein, Ur: 15 mg/dL
Protein/Creat Ratio: 96 mg/g creat (ref 0–200)

## 2021-01-04 LAB — TSH: TSH: 2.35 u[IU]/mL (ref 0.450–4.500)

## 2021-01-05 LAB — CBC WITH DIFFERENTIAL/PLATELET
Basophils Absolute: 0 10*3/uL (ref 0.0–0.2)
Basos: 0 %
EOS (ABSOLUTE): 0.1 10*3/uL (ref 0.0–0.4)
Eos: 1 %
Hematocrit: 38.7 % (ref 34.0–46.6)
Hemoglobin: 12.3 g/dL (ref 11.1–15.9)
Immature Grans (Abs): 0 10*3/uL (ref 0.0–0.1)
Immature Granulocytes: 0 %
Lymphocytes Absolute: 1.3 10*3/uL (ref 0.7–3.1)
Lymphs: 21 %
MCH: 30.7 pg (ref 26.6–33.0)
MCHC: 31.8 g/dL (ref 31.5–35.7)
MCV: 97 fL (ref 79–97)
Monocytes Absolute: 0.4 10*3/uL (ref 0.1–0.9)
Monocytes: 6 %
Neutrophils Absolute: 4.5 10*3/uL (ref 1.4–7.0)
Neutrophils: 72 %
Platelets: 362 10*3/uL (ref 150–450)
RBC: 4.01 x10E6/uL (ref 3.77–5.28)
RDW: 12.6 % (ref 11.7–15.4)
WBC: 6.4 10*3/uL (ref 3.4–10.8)

## 2021-01-05 LAB — RPR+RH+ABO+RUB AB+AB SCR
Antibody Screen: NEGATIVE
RPR Ser Ql: NONREACTIVE
Rh Factor: POSITIVE
Rubella Antibodies, IGG: 1.02 index (ref >0.99)

## 2021-01-05 LAB — URINE CULTURE, OB REFLEX

## 2021-01-05 LAB — SPECIMEN STATUS REPORT

## 2021-01-05 LAB — HCV AB W REFLEX TO QUANT PCR: HCV Ab: 0.1 s/co ratio (ref 0.0–0.9)

## 2021-01-05 LAB — HEPATITIS B SURFACE ANTIGEN: Hepatitis B Surface Ag: NEGATIVE

## 2021-01-05 LAB — CULTURE, OB URINE

## 2021-01-05 LAB — HCV INTERPRETATION

## 2021-01-12 ENCOUNTER — Other Ambulatory Visit: Payer: Self-pay

## 2021-01-12 ENCOUNTER — Ambulatory Visit (INDEPENDENT_AMBULATORY_CARE_PROVIDER_SITE_OTHER): Payer: 59 | Admitting: Obstetrics and Gynecology

## 2021-01-12 ENCOUNTER — Encounter: Payer: Self-pay | Admitting: Family Medicine

## 2021-01-12 ENCOUNTER — Encounter: Payer: 59 | Attending: Obstetrics & Gynecology | Admitting: Registered"

## 2021-01-12 ENCOUNTER — Ambulatory Visit: Payer: 59 | Admitting: Registered"

## 2021-01-12 ENCOUNTER — Encounter: Payer: Self-pay | Admitting: Obstetrics and Gynecology

## 2021-01-12 VITALS — BP 126/77 | HR 79

## 2021-01-12 DIAGNOSIS — E119 Type 2 diabetes mellitus without complications: Secondary | ICD-10-CM

## 2021-01-12 DIAGNOSIS — O10919 Unspecified pre-existing hypertension complicating pregnancy, unspecified trimester: Secondary | ICD-10-CM

## 2021-01-12 DIAGNOSIS — O24119 Pre-existing diabetes mellitus, type 2, in pregnancy, unspecified trimester: Secondary | ICD-10-CM | POA: Insufficient documentation

## 2021-01-12 DIAGNOSIS — O24111 Pre-existing diabetes mellitus, type 2, in pregnancy, first trimester: Secondary | ICD-10-CM

## 2021-01-12 DIAGNOSIS — Z3A11 11 weeks gestation of pregnancy: Secondary | ICD-10-CM

## 2021-01-12 DIAGNOSIS — O0990 Supervision of high risk pregnancy, unspecified, unspecified trimester: Secondary | ICD-10-CM

## 2021-01-12 MED ORDER — ASPIRIN EC 81 MG PO TBEC: 81.0000 mg | DELAYED_RELEASE_TABLET | Freq: Every day | ORAL | 3 refills | Status: DC

## 2021-01-12 MED ORDER — INSULIN ASPART 100 UNIT/ML FLEXPEN: 16.0000 [IU] | PEN_INJECTOR | Freq: Three times a day (TID) | SUBCUTANEOUS | 1 refills | Status: DC

## 2021-01-12 MED ORDER — INSULIN GLARGINE 100 UNIT/ML SOLOSTAR PEN: 12.0000 [IU] | PEN_INJECTOR | Freq: Two times a day (BID) | SUBCUTANEOUS | 11 refills | Status: DC

## 2021-01-12 NOTE — Progress Notes (Signed)
Patient was seen on 01/12/21 Type 2 Diabetes in pregnancy self management education. EDD 08/01/2021; [redacted]w[redacted]d.  Patient reports T2DM in 2011; no previous diabetes education.  Metformin from time of diagnosis until recent hospital admission. Pt reports side effects including diarrhea.  Patient is not taking insulin as instructed, misunderstood hospital instructions:  Pt reported insulin injections: Lantus 18 units tid with meals Novolog 8 units bid morning and evening  Patient is testing blood glucose since hospital discharge in January. Patient is NOTchecking blood sugar as dired fasting and 2 hrs ppbg.  Dietary recall:  (patient would like to drink something like Ensure for breakfast because she often doesn't feel like eating) B: just something to drink OR sausage, egg on honey wheat toast, fruit punch S: Chips or peanut butter crackers or nutragrain bar  L: chicken alfredo with broccoli (2) D: fried chicken, rice, green beans Beverages: juice, water, tea, soda  The following learning objectives reviewed during initial visit:   Pathophysiology of type 2 diabetes  Blood sugar management during pregnancy  Purpose and timing of checking blood sugar  Effect of sweetened beverages  How to pick out appropriate Nutritional Supplement drinks  Plan:  . Work with MD in adjusting how you are using your insulin . Start checking blood sugar 4x/day as directed . Bring log sheet to all appointments. . Consider drinking only water and other sugar free beverages . Use the Nutrition Facts Label handout to find nutritional supplements that are appropriate (e.g. Ensure High Protein, Glucerna, etc)  Patient instructed to monitor glucose levels: FBS: 70 - 95 mg/dl 2 hour: 350 <093 mg/dl (okay if goes down to 70 mg/dL)  Patient received the following handouts:  Glucose Log sheet  Nutrition Facts Label of Ensure High Protein  Patient will be seen for follow-up in 2 weeks or as needed.

## 2021-01-12 NOTE — Progress Notes (Addendum)
   PRENATAL VISIT NOTE  Subjective:  Misty [name] is a 30 y.o. G2P0010 at [redacted]w[redacted]d being seen today for ongoing prenatal care.  She is currently monitored for the following issues for this high-risk pregnancy and has Chronic hypertension affecting pregnancy; Supervision of high risk pregnancy, antepartum; and Type 2 diabetes mellitus affecting pregnancy in first trimester, antepartum on their problem list.  Patient reports no complaints.   . Vag. Bleeding: None.  Movement: Absent. Denies leaking of fluid.   The following portions of the patient's history were reviewed and updated as appropriate: allergies, current medications, past family history, past medical history, past social history, past surgical history and problem list.   Objective:   Vitals:   01/12/21 1004  BP: 126/77  Pulse: 79    Fetal Status: Fetal Heart Rate (bpm): 155   Movement: Absent     General:  Alert, oriented and cooperative. Patient is in no acute distress.  Skin: Skin is warm and dry. No rash noted.   Cardiovascular: Normal heart rate noted  Respiratory: Normal respiratory effort, no problems with respiration noted  Abdomen: Soft, gravid, appropriate for gestational age.  Pain/Pressure: Absent     Pelvic: Cervical exam deferred        Extremities: Normal range of motion.  Edema: None  Mental Status: Normal mood and affect. Normal behavior. Normal judgment and thought content.   Assessment and Plan:  Pregnancy: G2P0010 at [redacted]w[redacted]d 1. Supervision of high risk pregnancy, antepartum Start low dose asa in a few weeks. Labs normal last visit. cffdna test still pending.  Recommend afp at 15-16wks Needs anatomy u/s scheduled for 18-20wks  2. Type 2 diabetes mellitus affecting pregnancy in first trimester, antepartum Patient just came from DM appointment and DM education did not feel that a pump would be a good fit for her at the moment due to low compliance and inability to be able to use the device. She states she  is taking Lantus 18 tid and novolog 8 bid.  She is not checking her CBGs at all. Last a1c was 9.1 on 11/30/20.   I explained to her and her partner re: the rationale of needing to check her CBGs qid (am fasting and 2h PP) and affecting fetal development particularly and inability to titrate her insulin unless we know how she is running.   I told them that they will need bid long acting and insulin with meals.  I sent in NPH pen 12 units with breakfast and qhs and aspart 16 with meals   Patient needs fetal echo at 22-26wks  3. Chronic hypertension affecting pregnancy Doing well on no meds currently  Preterm labor symptoms and general obstetric precautions including but not limited to vaginal bleeding, contractions, leaking of fluid and fetal movement were reviewed in detail with the patient. Please refer to After Visit Summary for other counseling recommendations.   Return in about 1 week (around 01/19/2021) for in person, md visit.  Future Appointments  Date Time Provider Department Center  01/16/2021  9:00 AM Adam Phenix, MD [facility]  01/26/2021 10:15 AM [facility]  02/09/2021  9:15 AM [facility]  02/23/2021 10:15 AM [facility]  03/02/2021 10:30 AM Grayce Sessions, NP [facility] None  03/07/2021 10:00 AM [facility] NURSE [facility]  03/07/2021 10:15 AM [facility] US2 [facility]  03/09/2021 10:15 AM [facility]    Houston Bing, MD

## 2021-01-16 ENCOUNTER — Ambulatory Visit (INDEPENDENT_AMBULATORY_CARE_PROVIDER_SITE_OTHER): Payer: 59 | Admitting: Obstetrics & Gynecology

## 2021-01-16 ENCOUNTER — Other Ambulatory Visit: Payer: Self-pay

## 2021-01-16 VITALS — BP 128/83 | HR 92 | Wt 184.0 lb

## 2021-01-16 DIAGNOSIS — O10919 Unspecified pre-existing hypertension complicating pregnancy, unspecified trimester: Secondary | ICD-10-CM

## 2021-01-16 DIAGNOSIS — O24111 Pre-existing diabetes mellitus, type 2, in pregnancy, first trimester: Secondary | ICD-10-CM

## 2021-01-16 DIAGNOSIS — O0990 Supervision of high risk pregnancy, unspecified, unspecified trimester: Secondary | ICD-10-CM

## 2021-01-16 MED ORDER — BLOOD GLUCOSE MONITOR KIT: PACK | 0 refills | Status: DC

## 2021-01-16 NOTE — Patient Instructions (Signed)
Type 1 or Type 2 Diabetes Mellitus During Pregnancy, Diagnosis Type 1 and type 2 diabetes (diabetes mellitus) are long-term diseases. If diabetes is treated, it may not hurt you or your baby. What increases the risk? Things that increase risk for type 1 diabetes  You have a gene for type 1 diabetes.  You live in a place with cold weather.  You have had certain viruses.  You have certain problems with your disease-fighting system (immune system). Things that increase risk for type 2 diabetes  You have too much body weight.  You are not active enough.  You have or had one of these: ? Blood sugar that is higher than normal over time. ? A type of diabetes when you were pregnant. ? A condition that makes small fluid-filled sacs form on your ovaries.  You are: ? American Indian. ? African American. ? Hispanic/Latino. ? Asian/Pacific Islander. What are the signs or symptoms?  Feeling more thirst or hunger.  Having to pee more, or having to pee more during the night.  Changes in your weight that: ? Happen all of a sudden. ? Cannot be explained.  Getting sick a lot.  Feeling tired or weak.  Changes in how you see (vision). You may see things blurry.  Slow healing of cuts or bruises.  Tingling or loss of feeling (numbness) in your hands or feet.  Dark patches on your skin. How is this treated? You may need to see an expert in diabetes. Your doctor may tell you to:  Take insulin or other medicines each day.  Check your blood sugar often.  Follow an eating plan made by a food expert (dietitian).  Get regular exercise.  Find ways to deal with stress.  Take medicines to help you avoid problems caused by diabetes. Your doctor will set treatment goals for you. In general, you should have these blood sugar levels:  After not eating for 8 hours (after fasting): at or below 95 mg/dL (5.3 mmol/L).  After meals: ? One hour after a meal: at or below 140 mg/dL (7.8  mmol/L). ? Two hours after a meal: at or below 120 mg/dL (6.7 mmol/L).  HbA1C level: less than 6%.   Follow these instructions at home:  Take over-the-counter and prescription medicines only as told by your doctor.  Stay at a healthy weight while you are pregnant.  Keep all follow-up visits. Questions to ask your doctor  Should I meet with a diabetes expert?  Where can I find a support group?  What equipment will I need to care for myself at home?  What medicines do I need? When should I take them?  How often do I need to check my blood sugar?  What number can I call if I have questions?  When is my next doctor's visit? Where to find more information  American Diabetes Association: diabetes.org  Association of Diabetes Care & Education Specialists: diabeteseducator.org  International Diabetes Federation: idf.org Contact a doctor if:  Your blood sugar is at or above 240 mg/dL (13.3 mmol/L).  You have been sick or have had a fever for 2 days or more and you are not getting better.  You have any of these problems for more than 6 hours: ? You cannot eat or drink. ? You feel like you may vomit and you vomit. ? You have watery poop. Get help right away if:  You have very low blood sugar (severe hypoglycemia). This means your blood sugar is below 54 mg/dL (  3 mmol/L).  You get mixed up (confused).  You have trouble thinking or breathing.  Your baby moves less than normal.  You get blood or fluid coming from your vagina.  Your contractions start early. These feel like tightening in your lower belly. These symptoms may be an emergency. Get help right away. Call your local emergency services (911 in the [place]  Do not wait to see if the symptoms will go away.  Do not drive yourself to the hospital. Summary  Type 1 and type 2 diabetes are long-term diseases.  If diabetes is treated, it may not hurt you or your baby.  Treatment may include medicines, an eating  plan, exercise, and dealing with stress.  Check your blood sugar often. Your doctor will set treatment goals for you. This information is not intended to replace advice given to you by your health care provider. Make sure you discuss any questions you have with your health care provider. Document Revised: 03/16/2020 Document Reviewed: 03/16/2020 Elsevier Patient Education  2021 [employer].

## 2021-01-16 NOTE — Progress Notes (Signed)
   PRENATAL VISIT NOTE  Subjective:  Misty [name] is a 30 y.o. G2P0010 at [redacted]w[redacted]d being seen today for ongoing prenatal care.  She is currently monitored for the following issues for this high-risk pregnancy and has Chronic hypertension affecting pregnancy; Supervision of high risk pregnancy, antepartum; and Type 2 diabetes mellitus affecting pregnancy in first trimester, antepartum on their problem list.  Patient reports she hasn't tested BG for a few days as she lost her meter.  Contractions: Not present. Vag. Bleeding: None.  Movement: Absent. Denies leaking of fluid.   The following portions of the patient's history were reviewed and updated as appropriate: allergies, current medications, past family history, past medical history, past social history, past surgical history and problem list.   Objective:   Vitals:   01/16/21 0906  BP: 128/83  Pulse: 92  Weight: 184 lb (83.5 kg)    Fetal Status: Fetal Heart Rate (bpm): 159   Movement: Absent     General:  Alert, oriented and cooperative. Patient is in no acute distress.  Skin: Skin is warm and dry. No rash noted.   Cardiovascular: Normal heart rate noted  Respiratory: Normal respiratory effort, no problems with respiration noted  Abdomen: Soft, gravid, appropriate for gestational age.  Pain/Pressure: Absent     Pelvic: Cervical exam deferred        Extremities: Normal range of motion.  Edema: None  Mental Status: Normal mood and affect. Normal behavior. Normal judgment and thought content.   Assessment and Plan:  Pregnancy: G2P0010 at [redacted]w[redacted]d 1. Chronic hypertension affecting pregnancy No need to start meds yet except begin ASA  2. Type 2 diabetes mellitus affecting pregnancy in first trimester, antepartum Meter reordered - blood glucose meter kit and supplies KIT; Dispense based on patient and insurance preference. Use up to four times daily as directed. (FOR ICD-9 250.00, 250.01).  Dispense: 1 each; Refill: 0  3.  Supervision of high risk pregnancy, antepartum   Preterm labor symptoms and general obstetric precautions including but not limited to vaginal bleeding, contractions, leaking of fluid and fetal movement were reviewed in detail with the patient. Please refer to After Visit Summary for other counseling recommendations.   Return in about 4 weeks (around 02/13/2021).  Future Appointments  Date Time Provider [place]  01/26/2021 10:15 AM [facility]  02/09/2021  9:15 AM [facility]  02/23/2021 10:15 AM [facility]  03/02/2021 10:30 AM Kerin Perna, NP [facility] None  03/07/2021 10:00 AM [facility] NURSE [facility]  03/07/2021 10:15 AM [facility] US2 [facility]  03/09/2021 10:15 AM [facility]    Emeterio Reeve, MD

## 2021-01-16 NOTE — Addendum Note (Signed)
Addended byVidal Schwalbe on: 01/16/2021 04:41 PM   Modules accepted: Orders

## 2021-01-19 ENCOUNTER — Encounter: Payer: Self-pay | Admitting: *Deleted

## 2021-01-22 ENCOUNTER — Other Ambulatory Visit: Payer: Self-pay

## 2021-01-22 ENCOUNTER — Encounter (HOSPITAL_COMMUNITY): Payer: Self-pay | Admitting: Obstetrics and Gynecology

## 2021-01-22 ENCOUNTER — Inpatient Hospital Stay (HOSPITAL_COMMUNITY)
Admission: AD | Admit: 2021-01-22 | Discharge: 2021-01-22 | Disposition: A | Discharge status: Home / Self Care | Payer: 59 | Attending: Obstetrics and Gynecology | Admitting: Obstetrics and Gynecology

## 2021-01-22 DIAGNOSIS — Z3A12 12 weeks gestation of pregnancy: Secondary | ICD-10-CM | POA: Insufficient documentation

## 2021-01-22 DIAGNOSIS — E1165 Type 2 diabetes mellitus with hyperglycemia: Secondary | ICD-10-CM | POA: Insufficient documentation

## 2021-01-22 DIAGNOSIS — O26811 Pregnancy related exhaustion and fatigue, first trimester: Secondary | ICD-10-CM | POA: Diagnosis not present

## 2021-01-22 DIAGNOSIS — Z20822 Contact with and (suspected) exposure to covid-19: Secondary | ICD-10-CM | POA: Diagnosis not present

## 2021-01-22 DIAGNOSIS — Z794 Long term (current) use of insulin: Secondary | ICD-10-CM | POA: Insufficient documentation

## 2021-01-22 DIAGNOSIS — O24111 Pre-existing diabetes mellitus, type 2, in pregnancy, first trimester: Secondary | ICD-10-CM | POA: Diagnosis present

## 2021-01-22 DIAGNOSIS — Z87891 Personal history of nicotine dependence: Secondary | ICD-10-CM | POA: Insufficient documentation

## 2021-01-22 DIAGNOSIS — R739 Hyperglycemia, unspecified: Secondary | ICD-10-CM

## 2021-01-22 LAB — URINALYSIS, ROUTINE W REFLEX MICROSCOPIC
Bacteria, UA: NONE SEEN
Bilirubin Urine: NEGATIVE
Glucose, UA: >=500 mg/dL — AB
Hgb urine dipstick: NEGATIVE
Ketones, ur: NEGATIVE mg/dL
Leukocytes,Ua: NEGATIVE
Nitrite: NEGATIVE
Protein, ur: NEGATIVE mg/dL
Specific Gravity, Urine: 1.03 (ref 1.005–1.030)
pH: 6 (ref 5.0–8.0)

## 2021-01-22 LAB — COMPREHENSIVE METABOLIC PANEL
ALT: 12 U/L (ref 0–44)
AST: 16 U/L (ref 15–41)
Albumin: 3.2 g/dL — ABNORMAL LOW (ref 3.5–5.0)
Alkaline Phosphatase: 48 U/L (ref 38–126)
Anion gap: 6 (ref 5–15)
BUN: 7 mg/dL (ref 6–20)
CO2: 22 mmol/L (ref 22–32)
Calcium: 9.1 mg/dL (ref 8.9–10.3)
Chloride: 106 mmol/L (ref 98–111)
Creatinine, Ser: 0.6 mg/dL (ref 0.44–1.00)
GFR, Estimated: >60 mL/min (ref >60)
Glucose, Bld: 152 mg/dL — ABNORMAL HIGH (ref 70–99)
Potassium: 3.8 mmol/L (ref 3.5–5.1)
Sodium: 134 mmol/L — ABNORMAL LOW (ref 135–145)
Total Bilirubin: 0.4 mg/dL (ref 0.3–1.2)
Total Protein: 6.1 g/dL — ABNORMAL LOW (ref 6.5–8.1)

## 2021-01-22 LAB — CBC
HCT: 31 % — ABNORMAL LOW (ref 36.0–46.0)
Hemoglobin: 10.8 g/dL — ABNORMAL LOW (ref 12.0–15.0)
MCH: 31.6 pg (ref 26.0–34.0)
MCHC: 34.8 g/dL (ref 30.0–36.0)
MCV: 90.6 fL (ref 80.0–100.0)
Platelets: 282 10*3/uL (ref 150–400)
RBC: 3.42 MIL/uL — ABNORMAL LOW (ref 3.87–5.11)
RDW: 12.5 % (ref 11.5–15.5)
WBC: 8.4 10*3/uL (ref 4.0–10.5)
nRBC: 0 % (ref 0.0–0.2)

## 2021-01-22 LAB — GLUCOSE, CAPILLARY: Glucose-Capillary: 169 mg/dL — ABNORMAL HIGH (ref 70–99)

## 2021-01-22 LAB — RESP PANEL BY RT-PCR (FLU A&B, COVID) ARPGX2
Influenza A by PCR: NEGATIVE
Influenza B by PCR: NEGATIVE
SARS Coronavirus 2 by RT PCR: NEGATIVE

## 2021-01-22 NOTE — Discharge Instructions (Signed)
Type 1 or Type 2 Diabetes Mellitus During Pregnancy, Self Care Caring for yourself during your pregnancy when you have type 1 or type 2 diabetes (diabetes mellitus) means keeping your blood sugar (glucose) under control with a balance of:  Healthy eating.  Exercise.  Lifestyle changes.  Insulin or medicines, if needed.  Support from your health care team and others. The following information explains what you need to know to manage your diabetes at home during your pregnancy. What are the risks? If diabetes is treated, it is unlikely to cause problems. If it is not controlled with treatment:  It may cause problems during labor and delivery. Some of those problems can be harmful to the unborn baby (fetus) and the mother.  It may also cause a newborn to have breathing problems and low blood glucose. Having diabetes can put you at risk for other long-term, or chronic, conditions, such as heart disease and kidney disease. Your health care provider may prescribe medicines to help prevent complications from diabetes. How to monitor blood glucose Be sure to:  Check your blood glucose every day, as often as told by your health care provider.  Contact your health care provider if blood glucose is above your target for 2 tests in a row.  Have your hemoglobin A1C (HbA1C) level checked as often as told. Your health care provider will set personal treatment goals for you. Generally, the goal of treatment is to maintain the following blood glucose levels during pregnancy:  After not eating (after fasting) for 8 hours: at or below 95 mg/dL (5.3 mmol/L).  After meals: ? One hour after a meal: at or below 140 mg/dL (7.8 mmol/L). ? Two hours after a meal: at or below 120 mg/dL (6.7 mmol/L).  HbA1C level: less than 6%.   Follow these instructions at home: Medicines  Take over-the-counter and prescription medicines only as told by your health care provider. This includes diabetes medicines. Take  insulin or diabetes medicines every day, if your health care provider prescribed them.  Do not run out of insulin or other diabetes medicines that you take. Plan ahead so you always have these available.  If you use insulin, adjust your dosage based on your physical activity and what foods you eat. Your health care provider will tell you how to adjust your dosage.  Your health care provider may recommend that you take one low-dose aspirin (81 mg) each day to help prevent high blood pressure during pregnancy (preeclampsia or eclampsia). You may be at risk for preeclampsia or eclampsia if: ? You had any of the following during a previous pregnancy:  Preeclampsia or eclampsia.  A fetal growth rate that was slower than normal.  An early, or preterm, birth.  Placental abruption. This is when the placenta separates from the uterus.  Loss of an unborn baby (fetal loss). ? You are pregnant with more than one baby. ? You have other medical conditions, such as high blood pressure or an autoimmune disease. Eating and drinking What you eat and drink affects your blood glucose and your insulin dosage. Making good choices helps to control your diabetes and prevent other health problems. A healthy meal plan includes eating lean proteins, complex carbohydrates, fresh fruits and vegetables, low-fat dairy products, and healthy fats. Make an appointment to see a registered dietitian to help you create an eating plan that is right for you. Make sure that you:  Follow instructions from your health care provider about eating or drinking restrictions.  Drink  enough fluid to keep your urine pale yellow.  Eat healthy snacks between nutritious meals.  Keep a record of the carbohydrates that you eat. Do this by reading food labels and learning the standard serving sizes of foods.  Follow your sick-day plan whenever you cannot eat or drink as usual. Make this plan in advance with your health care provider.    Activity  Do at least 30 minutes of physical activity a day, or as much as your health care provider recommends during your pregnancy.  If you start a new exercise or activity, work with your health care provider to adjust your insulin, medicines, or food intake as needed. Lifestyle  Do not drink alcohol.  Do not use any products that contain nicotine or tobacco. These products include cigarettes, chewing tobacco, and vaping devices, such as e-cigarettes. If you need help quitting, ask your health care provider.  Learn to manage stress. If you need help with this, ask your health care provider. Care for your body  Keep your immunizations up to date.  Schedule an eye exam during the first term (trimester) of your pregnancy, or as told.  Check your skin and feet every day for cuts, bruises, redness, blisters, or sores. Schedule a foot exam once every year.  Brush your teeth and gums two times a day, and floss one or more times a day. Visit your dentist one or more times every 6 months.  Maintain a healthy weight during your pregnancy. General instructions  Talk with your health care provider about your risk for preeclampsia or eclampsia.  Share your diabetes management plan with people in your workplace, school, and household.  Check your urine for ketones when you are sick and as told.  Wear a medical alert bracelet or carry a medical alert card.  Keep all follow-up visits during your pregnancy (prenatal) and after delivery (postnatal). This is important. Questions to ask your health care provider  Do I need to meet with a certified diabetes care and education specialist?  Where can I find a support group for people with diabetes? Where to find more information  American Diabetes Association (ADA): diabetes.org  Association of Diabetes Care & Education Specialists (ADCES): diabeteseducator.org  International Diabetes Federation (IDF): http://hill.biz/ Contact a health care  provider if:  Your blood glucose is at or above 240 mg/dL (78.6 mmol/L).  You have been sick or have had a fever for 2 days or more and you are not getting better.  You have any of the following problems for more than 6 hours: ? You cannot eat or drink. ? You have nausea and vomiting. ? You have diarrhea. Get help right away if:  You have severe hypoglycemia. This means your blood sugar is below 54 mg/dL (3 mmol/L).  You become confused.  You have trouble thinking clearly.  You have trouble breathing.  Your baby is moving around less than usual.  You develop unusual discharge from your vagina.  You start having contractions early (prematurely). These may feel like tightening in your lower abdomen. These symptoms may represent a serious problem that is an emergency. Do not wait to see if the symptoms will go away. Get medical help right away. Call your local emergency services (911 in the [place] Do not drive yourself to the hospital. Summary  Caring for yourself when you have type 1 or type 2 diabetes means keeping your blood sugar (glucose) under control. You can do that with a balance of insulin and other medicines,  healthy eating, exercise, and lifestyle changes.  Check your blood glucose every day, as often as told by your health care provider.  Take insulin or diabetes medicines every day, if your health care provider prescribed them.  Keep all follow-up visits during your pregnancy (prenatal) and after delivery (postnatal). This is important. This information is not intended to replace advice given to you by your health care provider. Make sure you discuss any questions you have with your health care provider. Document Revised: 03/16/2020 Document Reviewed: 03/16/2020 Elsevier Patient Education  2021 [employer].

## 2021-01-22 NOTE — MAU Note (Addendum)
Pt arrived via EMS stating BP has been high x 2 days. And today started feeling weak-check BS and was 122 and BP 180/90 at 0830 this am. Pt also noticed dizziness and fatigue tried to go to work but began feeling worse. Pt denies fever or chills, nausea or vomiting. Denies diarrhea. Pt states she has been hypertensive and type 2 diabetic since age 30. Pt denies uterine contractions or cramping. Denies vaginal bleeding, discharge or SROM.

## 2021-01-22 NOTE — MAU Provider Note (Signed)
History     CSN: [id number]  Arrival date and time: 01/22/21 1555   Event Date/Time   First Provider Initiated Contact with Patient 01/22/21 1631      Chief Complaint  Patient presents with  . Hypertension  . Fatigue   HPI Misty [name] is a 30 y.o. G2P0010 at [redacted]w[redacted]d who presents via EMS for fatigue. Symptoms started this morning. Reports feeling weak & tired. Has history of hypertension - currently un medicated. Picked up her BP cuff last Wednesday & ever since has had elevated BPs at home. States her BP this morning was 108s/90s. Denies headache, visual disturbance, or epigastric pain.  Type 2 DM since she was 30 years old. Checked a fasting BS this morning & reports it was 123. Was told by EMS that her BS was 200s; last ate [employer] pizza at noon. States she takes 16 units & 8 units of insulin & that she hasn't missed any doses.  Denies fever/chills, sore throat, body aches, chest pain, cough, SOB, abdominal pain, n/v/d, dysuria, or vaginal bleeding.   OB History    Gravida  2   Para  0   Term  0   Preterm  0   AB  1   Living  0     SAB  1   IAB  0   Ectopic  0   Multiple  0   Live Births              Past Medical History:  Diagnosis Date  . Abscess 09/02/2020  . Allergy   . Anemia 2015  . Asthma    as child  . Diabetes mellitus without complication ([place]) 3976  . DKA (diabetic ketoacidosis) ([place]) 09/02/2020  . Eczema   . Hypertension 09/2013    Past Surgical History:  Procedure Laterality Date  . Abcess on back    . HERNIA REPAIR     umbilical hernia repair    Family History  Problem Relation Age of Onset  . Hypertension Mother   . Heart disease Mother 84       CHF  . Diabetes Mother   . Sickle cell anemia Brother   . Hypertension Maternal Grandfather   . Diabetes Maternal Grandfather   . Heart disease Maternal Grandfather   . Healthy Father   . Diabetes Maternal Grandmother   . Heart disease Maternal Grandmother     Social  History   Tobacco Use  . Smoking status: Former Smoker    Packs/day: 0.50    Years: 2.00    Pack years: 1.00    Quit date: 09/08/2013    Years since quitting: 7.3  . Smokeless tobacco: Never Used  Vaping Use  . Vaping Use: Never used  Substance Use Topics  . Alcohol use: No    Alcohol/week: 0.0 standard drinks  . Drug use: No    Allergies:  Allergies  Allergen Reactions  . Iodides Anaphylaxis and Itching  . Morphine And Related Anaphylaxis and Itching  . Shellfish Allergy Anaphylaxis and Itching    Pt reports "watery eyes"  . Ultram [Tramadol Hcl] Hives    Hives and swollen lips     Medications Prior to Admission  Medication Sig Dispense Refill Last Dose  . insulin aspart (NOVOLOG) 100 UNIT/ML FlexPen Inject 16 Units into the skin 3 (three) times daily with meals. 15 mL 1 01/22/2021 at 1200  . insulin glargine (LANTUS) 100 UNIT/ML Solostar Pen Inject 12 Units into the skin 2 (two)  times daily. 12 units with breakfast and 12 units just before bed 15 mL 11 01/22/2021 at Unknown time  . Prenatal Vit-Fe Fumarate-FA (PRENATAL VITAMINS) 28-0.8 MG TABS Take 1 tablet by mouth daily. 30 tablet 11 01/22/2021 at Unknown time  . [START ON 02/01/2021] aspirin EC 81 MG tablet Take 1 tablet (81 mg total) by mouth daily. Swallow whole. (Patient not taking: Reported on 01/16/2021) 90 tablet 3   . blood glucose meter kit and supplies KIT Dispense based on patient and insurance preference. Use up to four times daily as directed. (FOR ICD-9 250.00, 250.01). 1 each 0   . blood glucose meter kit and supplies Dispense based on patient and insurance preference. Use up to four times daily as directed. (FOR ICD-10 E10.9, E11.9). 1 each 0   . Blood Pressure Monitoring DEVI 1 each by Does not apply route once a week. 1 each 0   . Insulin Pen Needle 31G X 5 MM MISC 1 Device by Does not apply route 4 (four) times daily. For use with insulin pens 100 each 11     Review of Systems  Constitutional: Positive for  fatigue. Negative for chills, diaphoresis and fever.  Gastrointestinal: Negative.   Genitourinary: Negative.   Musculoskeletal: Negative.    Physical Exam   Blood pressure 137/71, pulse 71, temperature 99.3 F (37.4 C), temperature source Oral, resp. rate 18, height $RemoveBe'5\' 3"'DUjFCEkPf$  (1.6 m), weight 83.5 kg, last menstrual period 10/14/2020, SpO2 93 %.  Physical Exam Vitals and nursing note reviewed.  Constitutional:      General: She is not in acute distress.    Appearance: Normal appearance. She is normal weight. She is not ill-appearing.  HENT:     Head: Normocephalic and atraumatic.  Eyes:     General: No scleral icterus. Cardiovascular:     Rate and Rhythm: Normal rate and regular rhythm.     Heart sounds: Normal heart sounds.  Pulmonary:     Effort: Pulmonary effort is normal. No respiratory distress.     Breath sounds: Normal breath sounds. No wheezing.  Abdominal:     Tenderness: There is no abdominal tenderness.  Skin:    General: Skin is warm and dry.  Neurological:     Mental Status: She is alert.  Psychiatric:        Mood and Affect: Mood normal.        Behavior: Behavior normal.     MAU Course  Procedures Results for orders placed or performed during the hospital encounter of 01/22/21 (from the past 24 hour(s))  Urinalysis, Routine w reflex microscopic Urine, Clean Catch     Status: Abnormal   Collection Time: 01/22/21  4:00 PM  Result Value Ref Range   Color, Urine YELLOW YELLOW   APPearance HAZY (A) CLEAR   Specific Gravity, Urine 1.030 1.005 - 1.030   pH 6.0 5.0 - 8.0   Glucose, UA >=500 (A) NEGATIVE mg/dL   Hgb urine dipstick NEGATIVE NEGATIVE   Bilirubin Urine NEGATIVE NEGATIVE   Ketones, ur NEGATIVE NEGATIVE mg/dL   Protein, ur NEGATIVE NEGATIVE mg/dL   Nitrite NEGATIVE NEGATIVE   Leukocytes,Ua NEGATIVE NEGATIVE   RBC / HPF 0-5 0 - 5 RBC/hpf   WBC, UA 0-5 0 - 5 WBC/hpf   Bacteria, UA NONE SEEN NONE SEEN   Squamous Epithelial / LPF 0-5 0 - 5   Mucus  PRESENT   Glucose, capillary     Status: Abnormal   Collection Time: 01/22/21  4:53 PM  Result Value Ref Range   Glucose-Capillary 169 (H) 70 - 99 mg/dL  CBC     Status: Abnormal   Collection Time: 01/22/21  4:55 PM  Result Value Ref Range   WBC 8.4 4.0 - 10.5 K/uL   RBC 3.42 (L) 3.87 - 5.11 MIL/uL   Hemoglobin 10.8 (L) 12.0 - 15.0 g/dL   HCT 31.0 (L) 36.0 - 46.0 %   MCV 90.6 80.0 - 100.0 fL   MCH 31.6 26.0 - 34.0 pg   MCHC 34.8 30.0 - 36.0 g/dL   RDW 12.5 11.5 - 15.5 %   Platelets 282 150 - 400 K/uL   nRBC 0.0 0.0 - 0.2 %  Comprehensive metabolic panel     Status: Abnormal   Collection Time: 01/22/21  4:55 PM  Result Value Ref Range   Sodium 134 (L) 135 - 145 mmol/L   Potassium 3.8 3.5 - 5.1 mmol/L   Chloride 106 98 - 111 mmol/L   CO2 22 22 - 32 mmol/L   Glucose, Bld 152 (H) 70 - 99 mg/dL   BUN 7 6 - 20 mg/dL   Creatinine, Ser 0.60 0.44 - 1.00 mg/dL   Calcium 9.1 8.9 - 10.3 mg/dL   Total Protein 6.1 (L) 6.5 - 8.1 g/dL   Albumin 3.2 (L) 3.5 - 5.0 g/dL   AST 16 15 - 41 U/L   ALT 12 0 - 44 U/L   Alkaline Phosphatase 48 38 - 126 U/L   Total Bilirubin 0.4 0.3 - 1.2 mg/dL   GFR, Estimated >60 >60 mL/min   Anion gap 6 5 - 15  Resp Panel by RT-PCR (Flu A&B, Covid) Nasopharyngeal Swab     Status: None   Collection Time: 01/22/21  4:55 PM   Specimen: Nasopharyngeal Swab; Nasopharyngeal(NP) swabs in vial transport medium  Result Value Ref Range   SARS Coronavirus 2 by RT PCR NEGATIVE NEGATIVE   Influenza A by PCR NEGATIVE NEGATIVE   Influenza B by PCR NEGATIVE NEGATIVE    MDM FHT present via doppler  Patient reports fatigue & hypertension at home. She remains normotensive in MAU. Resting comfortable in the MAU bed. Vital signs normal.  Hyperglycemic after eating pizza for lunch. BS out of range today; she has appt with diabetes educator & close f/u with [facility]. Reviewed with Dr. Rip Harbour, office will manage dosage adjustments to her insulin. Per labs in MAU, no evidence of DKA.    Covid & flu negative  Assessment and Plan   1. Fatigue during pregnancy in first trimester   2. Type 2 diabetes mellitus affecting pregnancy in first trimester, antepartum   3. Hyperglycemia   4. [redacted] weeks gestation of pregnancy    -Discussed limiting high sugar & refined carbs in diet & to drink plenty of water -pt should keep blood sugar log, QID, to take to [place] later this week -take BP cuff to office to ensure proper use & fit since MAU BPs don't match up with vitals at home -reviewed reasons to return to [place] 01/22/2021, 6:02 PM

## 2021-01-26 ENCOUNTER — Encounter: Payer: 59 | Admitting: Registered"

## 2021-01-26 ENCOUNTER — Other Ambulatory Visit: Payer: Self-pay

## 2021-01-26 ENCOUNTER — Ambulatory Visit: Payer: 59 | Admitting: Registered"

## 2021-01-26 DIAGNOSIS — O24119 Pre-existing diabetes mellitus, type 2, in pregnancy, unspecified trimester: Secondary | ICD-10-CM | POA: Diagnosis not present

## 2021-01-26 DIAGNOSIS — O24111 Pre-existing diabetes mellitus, type 2, in pregnancy, first trimester: Secondary | ICD-10-CM

## 2021-01-26 MED ORDER — DEXCOM G6 RECEIVER DEVI: 1.0000 | Freq: Once | 0 refills | Status: AC

## 2021-01-26 MED ORDER — DEXCOM G6 TRANSMITTER MISC: 1.0000 | 3 refills | Status: DC

## 2021-01-26 MED ORDER — DEXCOM G6 SENSOR MISC: 6 refills | Status: DC

## 2021-01-26 NOTE — Progress Notes (Signed)
Patient was seen on 01/26/21 for follow-up assessment and education for Type 2 Diabetes in pregnancy. EDD 08/01/21; [redacted]w[redacted]d.  Patient states she is testing blood glucose as directed pre breakfast and 2 hours after each meal, however does not bring blood sugar log to healthcare visits. Patient reports her numbers have been good.  Patient was admitted 4 days ago to ED 5/22 for fatigue, low blood sugar symptoms.   Medications (Pt reported):  Lantus: 8 units am & pm; Novolog 18 units tid with meals.   Patient used 18 units this morning for a pack of peanut butter nabs and has had about 4 ounces of regular Sprite. BG was 152 mg/dL.  Dietary recall: B: peanut butter crackers, soda L Spaghetti, yams, sweet tea D: Pork, cabbage, corn, water Beverages: water, regular soda  Patient states she is tired a lot Pt reports she often gets >9hrs sleep plus up to 2-hour naps during the day.  The following learning objectives reviewed during follow-up visit:   Tweaks to diet that may help reduce blood sugar and amount of insulin needed  Creating balanced meals  Choosing balanced snacks  Process for ordering a continuous glucose monitor (Dexcom G6)  Plan:  . Continue with great work eating balanced meals.  . Remember to include non-starchy vegetables with having other starches with meals . Continue using insulin as directed   Patient instructed to monitor glucose levels: FBS: 70 - 95 mg/dl 2 hour: <124 mg/dl  Patient received the following handouts:  Balanced Snack Sheet  MyPlate for Diabetes  Patient will be seen for follow-up in 2 weeks to start Dexcom G6 CGM.

## 2021-01-31 ENCOUNTER — Other Ambulatory Visit: Payer: Self-pay | Admitting: *Deleted

## 2021-01-31 ENCOUNTER — Other Ambulatory Visit: Payer: Self-pay

## 2021-01-31 DIAGNOSIS — O24119 Pre-existing diabetes mellitus, type 2, in pregnancy, unspecified trimester: Secondary | ICD-10-CM

## 2021-02-07 ENCOUNTER — Encounter: Payer: Self-pay | Admitting: Family Medicine

## 2021-02-08 ENCOUNTER — Other Ambulatory Visit: Payer: Self-pay | Admitting: Pharmacist

## 2021-02-08 NOTE — Progress Notes (Addendum)
Reached out to patient's pharmacy about prior authorization for Dexcom G6 who stated it was still not approved.   Touched base with patient's insurance company and was able to have prior authorization submitted over the phone.   Dexcom G6 sensor, transmitter, and receiver approved from 02/08/2021-08/07/2021 (Confirmation # [id number]).

## 2021-02-09 ENCOUNTER — Ambulatory Visit: Payer: 59 | Admitting: Registered"

## 2021-02-09 ENCOUNTER — Encounter: Payer: 59 | Attending: Obstetrics & Gynecology | Admitting: Registered"

## 2021-02-09 ENCOUNTER — Other Ambulatory Visit: Payer: Self-pay

## 2021-02-09 ENCOUNTER — Encounter: Payer: Self-pay | Admitting: Family Medicine

## 2021-02-09 DIAGNOSIS — O24119 Pre-existing diabetes mellitus, type 2, in pregnancy, unspecified trimester: Secondary | ICD-10-CM | POA: Diagnosis present

## 2021-02-09 DIAGNOSIS — O24111 Pre-existing diabetes mellitus, type 2, in pregnancy, first trimester: Secondary | ICD-10-CM

## 2021-02-09 NOTE — Progress Notes (Signed)
Dexcom G6 Personal CGM Training Start time:0920    End time: 1020 Total time: 60 min  Education topics: -Getting to know device    (Receiver:Phone programmed ) -Setting up device (high alert  250  , low alert 70  ) -Setting alert profile -Inserting sensor  -Inserting transmitter -Ending sensor session -Trouble shooting -Clarity app information   Patient has [facility] tech support and my contact information.  Dexcom G6 sample provided: Lot [id number]. Patient will pick up Rx later today.

## 2021-02-14 ENCOUNTER — Other Ambulatory Visit: Payer: Self-pay

## 2021-02-15 ENCOUNTER — Other Ambulatory Visit (HOSPITAL_COMMUNITY)
Admission: RE | Admit: 2021-02-15 | Discharge: 2021-02-15 | Disposition: A | Discharge status: Home / Self Care | Payer: 59 | Source: Ambulatory Visit | Attending: Family Medicine | Admitting: Family Medicine

## 2021-02-15 ENCOUNTER — Other Ambulatory Visit: Payer: Self-pay

## 2021-02-15 ENCOUNTER — Ambulatory Visit (INDEPENDENT_AMBULATORY_CARE_PROVIDER_SITE_OTHER): Payer: 59 | Admitting: Family Medicine

## 2021-02-15 VITALS — BP 129/86 | HR 79 | Wt 189.0 lb

## 2021-02-15 DIAGNOSIS — O10919 Unspecified pre-existing hypertension complicating pregnancy, unspecified trimester: Secondary | ICD-10-CM

## 2021-02-15 DIAGNOSIS — O24111 Pre-existing diabetes mellitus, type 2, in pregnancy, first trimester: Secondary | ICD-10-CM

## 2021-02-15 DIAGNOSIS — O0990 Supervision of high risk pregnancy, unspecified, unspecified trimester: Secondary | ICD-10-CM

## 2021-02-15 DIAGNOSIS — Z124 Encounter for screening for malignant neoplasm of cervix: Secondary | ICD-10-CM | POA: Insufficient documentation

## 2021-02-15 DIAGNOSIS — E119 Type 2 diabetes mellitus without complications: Secondary | ICD-10-CM

## 2021-02-15 DIAGNOSIS — F99 Mental disorder, not otherwise specified: Secondary | ICD-10-CM | POA: Diagnosis not present

## 2021-02-15 LAB — POCT URINALYSIS DIP (DEVICE)
Bilirubin Urine: NEGATIVE
Glucose, UA: 100 mg/dL — AB
Hgb urine dipstick: NEGATIVE
Ketones, ur: NEGATIVE mg/dL
Leukocytes,Ua: NEGATIVE
Nitrite: NEGATIVE
Protein, ur: NEGATIVE mg/dL
Specific Gravity, Urine: 1.02 (ref 1.005–1.030)
Urobilinogen, UA: 0.2 mg/dL (ref 0.0–1.0)
pH: 6 (ref 5.0–8.0)

## 2021-02-15 MED ORDER — INSULIN ASPART 100 UNIT/ML FLEXPEN: 20.0000 [IU] | PEN_INJECTOR | Freq: Three times a day (TID) | SUBCUTANEOUS | 1 refills | Status: DC

## 2021-02-15 MED ORDER — INSULIN GLARGINE 100 UNIT/ML SOLOSTAR PEN: 14.0000 [IU] | PEN_INJECTOR | Freq: Two times a day (BID) | SUBCUTANEOUS | 11 refills | Status: DC

## 2021-02-15 NOTE — Patient Instructions (Signed)

## 2021-02-15 NOTE — Progress Notes (Signed)
   PRENATAL VISIT NOTE  Subjective:  Misty [name] is a 30 y.o. G2P0010 at [redacted]w[redacted]d being seen today for ongoing prenatal care.  She is currently monitored for the following issues for this high-risk pregnancy and has Chronic hypertension affecting pregnancy; Supervision of high risk pregnancy, antepartum; and Type 2 diabetes mellitus affecting pregnancy in first trimester, antepartum on their problem list.  Patient reports no complaints.  Contractions: Not present. Vag. Bleeding: None.  Movement: Absent. Denies leaking of fluid.   The following portions of the patient's history were reviewed and updated as appropriate: allergies, current medications, past family history, past medical history, past social history, past surgical history and problem list.   Objective:   Vitals:   02/15/21 0945  BP: 129/86  Pulse: 79  Weight: 189 lb (85.7 kg)    Fetal Status: Fetal Heart Rate (bpm): 155   Movement: Absent     General:  Alert, oriented and cooperative. Patient is in no acute distress.  Skin: Skin is warm and dry. No rash noted.   Cardiovascular: Normal heart rate noted  Respiratory: Normal respiratory effort, no problems with respiration noted  Abdomen: Soft, gravid, appropriate for gestational age.  Pain/Pressure: Absent     Pelvic: Cervical exam deferred        Extremities: Normal range of motion.  Edema: None  Mental Status: Normal mood and affect. Normal behavior. Normal judgment and thought content.   Assessment and Plan:  Pregnancy: G2P0010 at [redacted]w[redacted]d 1. Supervision of high risk pregnancy, antepartum  AFP today - AFP, Serum, Open Spina Bifida  2. Chronic hypertension affecting pregnancy BP is well controlled on no meds Begin ASA  3. Type 2 diabetes mellitus affecting pregnancy in first trimester, antepartum Schedule ECHO and ophtho referral Fasting are in the 110-120 range, postprandial CBGs 120-140. Have increased her Lantus and her Novolog - Ambulatory referral to  Pediatric Cardiology - insulin glargine (LANTUS) 100 UNIT/ML Solostar Pen; Inject 14 Units into the skin 2 (two) times daily. 12 units with breakfast and 12 units just before bed  Dispense: 15 mL; Refill: 11 - insulin aspart (NOVOLOG) 100 UNIT/ML FlexPen; Inject 20 Units into the skin 3 (three) times daily with meals.  Dispense: 15 mL; Refill: 1 - Ambulatory referral to Ophthalmology  4. Screening for cervical cancer Pap due and performed today - Cytology - PAP( [place])  General obstetric precautions including but not limited to vaginal bleeding, contractions, leaking of fluid and fetal movement were reviewed in detail with the patient. Please refer to After Visit Summary for other counseling recommendations.   Return in 2 weeks (on 03/01/2021).  Future Appointments  Date Time Provider Department Center  02/16/2021  9:15 AM [facility]  02/23/2021 10:15 AM [facility]  03/01/2021  1:15 PM Reva Bores, MD [facility]  03/02/2021 10:30 AM Grayce Sessions, NP [facility] None  03/07/2021 10:00 AM [facility] NURSE [facility]  03/07/2021 10:15 AM [facility] US2 [facility]  03/09/2021 10:15 AM [facility]    Reva Bores, MD

## 2021-02-16 ENCOUNTER — Encounter: Payer: 59 | Admitting: Registered"

## 2021-02-16 ENCOUNTER — Ambulatory Visit: Payer: 59 | Admitting: Registered"

## 2021-02-16 DIAGNOSIS — O24111 Pre-existing diabetes mellitus, type 2, in pregnancy, first trimester: Secondary | ICD-10-CM

## 2021-02-16 DIAGNOSIS — O24119 Pre-existing diabetes mellitus, type 2, in pregnancy, unspecified trimester: Secondary | ICD-10-CM | POA: Diagnosis not present

## 2021-02-16 NOTE — Progress Notes (Signed)
Patient was seen on 02/16/21 for follow-up assessment and education for T2DM in pregnancy.  EDD 08/01/21; [redacted]w[redacted]d.    A1c 09/02/2020 11.4%  Medication Rx: Lantus 14 units with breakfast and HS; Novolog 20 units tid (was increased to these amounts at 02/14/21 MD visit)  Patient states she feels good about her blood sugar. RD acknowledged improvement. Patient has not had full diabetes education yet. Today we started basic carb counting and provided handout that contains full education. Will continue education in future visits.  Patient started using DexcomG6 CGM 6/09/202. Patient took off sensor because she thought it was time to change, but did not have Rx picked up yet. RD provided another sample since she may not be able to make it to the pharmacy in the next day or 2 due to her work schedule. Lot # [id number], Exp 09/02/2021  Pt states she is interested in the insulin pump option. The Patient Information form was Faxed to [name] at [place] today to start the process of verifying insurance coverage. Form placed in Omnipod scan folder.     The following learning objectives reviewed during follow-up visit:  Sensor replacement notification in G6 app Started Carb Counting Using "event feature" in G6 app to track BG readings 4x/day  Plan:  Pick up Dexcom prescription Watch for a text from [name] from [employer]) Using the "Event" feature in Dexcom G6 app, start using the log book or log sheet to write down FBS and PPBG for your healthcare team to review at appointments.  Patient instructed to monitor glucose levels: FBS: 60 - 95 mg/dl 2 hour: <619 mg/dl  Patient received the following handouts: Omnipod brochure Contour next blood sugar log book Pre-existing diabetes in pregnancy  Patient will be seen for follow-up in 1 weeks or as needed.

## 2021-02-16 NOTE — Patient Instructions (Addendum)
To mark you fasting blood sugar, Create and "Event" in your Dexcom app for Long-acting insulin. Set the time for before you eat.  Put in "Events" for your meals: Use the carb feature and enter in how many carbs you estimate that you had. For the time, calculate 2 hours after you eat.  Consider reducing your sugar intake by replace your sweet tea with something with less or no sugar. [employer] is an option to Corporate treasurer

## 2021-02-17 ENCOUNTER — Encounter: Payer: Self-pay | Admitting: Family Medicine

## 2021-02-17 DIAGNOSIS — O280 Abnormal hematological finding on antenatal screening of mother: Secondary | ICD-10-CM | POA: Insufficient documentation

## 2021-02-17 LAB — AFP, SERUM, OPEN SPINA BIFIDA
AFP MoM: 4.36
AFP Value: 117 ng/mL
Gest. Age on Collection Date: 16.1 weeks
Maternal Age At EDD: 30.8 yr
OSBR Risk 1 IN: 10
Test Results:: POSITIVE — AB
Weight: 189 [lb_av]

## 2021-02-17 LAB — CYTOLOGY - PAP
Comment: NEGATIVE
Diagnosis: NEGATIVE
High risk HPV: NEGATIVE

## 2021-02-20 ENCOUNTER — Telehealth: Payer: Self-pay

## 2021-02-20 DIAGNOSIS — O280 Abnormal hematological finding on antenatal screening of mother: Secondary | ICD-10-CM

## 2021-02-20 NOTE — Telephone Encounter (Signed)
Called pt and informed her that her AFP results are abnormal and that we have scheduled her a genetic counseling appt of 03/07/21 @ 0845.  Pt verbalized understanding.   Addison Naegeli, RN  02/20/21

## 2021-02-20 NOTE — Telephone Encounter (Addendum)
-----   Message from Reva Bores, MD sent at 02/17/2021 11:05 AM EDT ----- AFP is positive, please refer to MFM  Called pt and left message that I am calling with results and appt.  Please give the office a call.  Genetic counseling appt scheduled for July 5th @ 0845 prior to her U/S appt.

## 2021-02-22 ENCOUNTER — Other Ambulatory Visit: Payer: Self-pay | Admitting: Lactation Services

## 2021-02-22 ENCOUNTER — Telehealth: Payer: Self-pay | Admitting: *Deleted

## 2021-02-22 ENCOUNTER — Other Ambulatory Visit: Payer: Self-pay

## 2021-02-22 MED ORDER — OMNIPOD DASH PODS (GEN 4) MISC: 1.0000 | 6 refills | Status: AC

## 2021-02-22 MED ORDER — INSULIN ASPART 100 UNIT/ML IJ SOLN: INTRAMUSCULAR | 12 refills | Status: DC

## 2021-02-22 NOTE — Telephone Encounter (Signed)
Received a voice message from [place] at [facility] and states she is calling re: Omnipod RX. States requires prior authorization and they can only get the 5th generation , not the 4th generation. Asks for a call back. Kal Chait,RN

## 2021-02-22 NOTE — Telephone Encounter (Signed)
Received a voicemail this am stating Asher Muir calling from [facility] to confirm we have received a + MSAFP drawn 02/15/21 and to call if not received.  Per chart was received, reviewed by provider and patient notified. Mayia Megill,RN

## 2021-02-22 NOTE — Progress Notes (Signed)
Novolog and Omnipods ordered per Heywood Bene, RD, Diabetes Educator.

## 2021-02-23 ENCOUNTER — Encounter: Payer: Self-pay | Admitting: Family Medicine

## 2021-02-23 ENCOUNTER — Telehealth: Payer: Self-pay | Admitting: Lactation Services

## 2021-02-23 ENCOUNTER — Ambulatory Visit: Payer: 59 | Admitting: Registered"

## 2021-02-23 ENCOUNTER — Encounter: Payer: 59 | Admitting: Registered"

## 2021-02-23 DIAGNOSIS — O24119 Pre-existing diabetes mellitus, type 2, in pregnancy, unspecified trimester: Secondary | ICD-10-CM | POA: Diagnosis not present

## 2021-02-23 DIAGNOSIS — O24111 Pre-existing diabetes mellitus, type 2, in pregnancy, first trimester: Secondary | ICD-10-CM

## 2021-02-23 NOTE — Telephone Encounter (Signed)
Called Mary at [facility] and she reports that patient prescription was sent to a specialty pharmacy and that patient reports she has confirmation that the pods have been ordered and she will receive them tomorrow. No need to pursue from [facility].

## 2021-02-23 NOTE — Telephone Encounter (Signed)
Spoke with Heywood Bene, RD about patient not being able to get the Rolm Bookbinder at her Pharmacy. Was informed to call [facility] @ Omnipod to clarify Gen 4 vs Gen 5 Omnipods as the understanding is Gen 5 is for Type 1 DM.   Called and LM with Corrie Dandy at [place], asked her to call back to discuss what Omnipod version to order for patient. Call back number left.

## 2021-02-23 NOTE — Progress Notes (Signed)
Patient was seen on 02/23/21 for follow-up assessment and education for Gestational Diabetes. EDD 08/01/21; [redacted]w[redacted]d. Patient states changes to diet/lifestyle including eating more salads, less carbohydrates   Medication: Lantus 14 u bid/ novolog 20 u tid  Patient uses Dexcom CGM   Patient's insurance been approved for Omnipod and is scheduled for delivery to her home on Friday.  Reviewed what to expect with next steps and she saved Vernona Rieger Hite's phone number into her phone and will be watching for a call from Vernona Rieger  The following learning objectives reviewed during follow-up visit:  Next steps for Omnipod Importance of keeping eye appointment in September  Plan:  Pick up Novolog insulin vials from pharmacy before Omnipod training Tentatively plan on July 7 at 10:15 for Omnipod training  Continue to include salads and baked chicken in your diet Continue using Lantus and Novolog until you are switched over to Omnipod   Patient instructed to monitor glucose levels: FBS: 60 - 95 mg/dl 2 hour: <341 mg/dl  Patient received the following handouts: none  Patient will be seen for follow-up in 2 weeks or as needed.

## 2021-02-27 ENCOUNTER — Telehealth: Payer: Self-pay | Admitting: Registered"

## 2021-02-27 NOTE — Telephone Encounter (Signed)
[facility] called patient to confirm appointment for Omnipod training with Irven Baltimore on July 7 @ 10:15. Pt states she has received the Omnipod kit, but is waiting on the insulin.

## 2021-03-01 ENCOUNTER — Encounter: Payer: 59 | Admitting: Family Medicine

## 2021-03-01 ENCOUNTER — Other Ambulatory Visit: Payer: Self-pay

## 2021-03-01 ENCOUNTER — Telehealth: Payer: Self-pay | Admitting: Registered"

## 2021-03-01 ENCOUNTER — Other Ambulatory Visit: Payer: Self-pay | Admitting: General Practice

## 2021-03-01 ENCOUNTER — Encounter: Payer: Self-pay | Admitting: General Practice

## 2021-03-01 DIAGNOSIS — O219 Vomiting of pregnancy, unspecified: Secondary | ICD-10-CM

## 2021-03-01 MED ORDER — PROMETHAZINE HCL 25 MG PO TABS: 25.0000 mg | ORAL_TABLET | Freq: Four times a day (QID) | ORAL | 0 refills | Status: DC | PRN

## 2021-03-01 NOTE — Progress Notes (Unsigned)
Patient came by office this morning reporting that she fell trying to get out of the shower and hit her belly. She reports pain similar menstrual cramps rated at a 6 but states they come and go. Patient denies bleeding. Patient also reports nausea/vomiting this morning. BP 122/73 FHR 160bpm. She states maybe she just got overheated in the shower and that's what happened. Discussed decreasing shower temperature in the future and using some cold water before she gets out if she finds herself getting hot. Advised if pain becomes severe/constant or she starts to have bleeding to go to MAU. Recommended extra strength tylenol today & Rx for phenergan sent to pharmacy. Patient verbalized understanding.  Chase Caller RN BSN 03/01/21

## 2021-03-01 NOTE — Telephone Encounter (Signed)
RD called [facility] to find out why the Novolog was not approved. Melissa at [facility] fixed the issue and said the Rx will be ready for pick up tomorrow after 2 pm. RD called patient with Update.  The omnipod refills are still pending prior Authorization. Patient has what she need for training on July 7, but will need the refills soon after. Note being routed to [facility] for support in resolving issue.

## 2021-03-02 ENCOUNTER — Ambulatory Visit (INDEPENDENT_AMBULATORY_CARE_PROVIDER_SITE_OTHER): Payer: 59 | Admitting: Primary Care

## 2021-03-07 ENCOUNTER — Encounter: Payer: Self-pay | Admitting: Family Medicine

## 2021-03-07 ENCOUNTER — Encounter: Payer: Self-pay | Admitting: *Deleted

## 2021-03-07 ENCOUNTER — Ambulatory Visit (HOSPITAL_BASED_OUTPATIENT_CLINIC_OR_DEPARTMENT_OTHER): Payer: 59

## 2021-03-07 ENCOUNTER — Other Ambulatory Visit: Payer: Self-pay

## 2021-03-07 ENCOUNTER — Ambulatory Visit: Payer: 59 | Admitting: *Deleted

## 2021-03-07 ENCOUNTER — Ambulatory Visit (HOSPITAL_BASED_OUTPATIENT_CLINIC_OR_DEPARTMENT_OTHER): Payer: 59 | Admitting: Obstetrics

## 2021-03-07 ENCOUNTER — Ambulatory Visit: Payer: 59 | Attending: Family Medicine

## 2021-03-07 ENCOUNTER — Other Ambulatory Visit: Payer: Self-pay | Admitting: *Deleted

## 2021-03-07 ENCOUNTER — Ambulatory Visit (INDEPENDENT_AMBULATORY_CARE_PROVIDER_SITE_OTHER): Payer: 59 | Admitting: Family Medicine

## 2021-03-07 ENCOUNTER — Ambulatory Visit: Payer: 59

## 2021-03-07 VITALS — BP 139/90 | HR 75

## 2021-03-07 VITALS — BP 139/89 | HR 79 | Wt 196.8 lb

## 2021-03-07 DIAGNOSIS — Z3A19 19 weeks gestation of pregnancy: Secondary | ICD-10-CM | POA: Insufficient documentation

## 2021-03-07 DIAGNOSIS — Z794 Long term (current) use of insulin: Secondary | ICD-10-CM

## 2021-03-07 DIAGNOSIS — O0990 Supervision of high risk pregnancy, unspecified, unspecified trimester: Secondary | ICD-10-CM | POA: Insufficient documentation

## 2021-03-07 DIAGNOSIS — O280 Abnormal hematological finding on antenatal screening of mother: Secondary | ICD-10-CM | POA: Diagnosis not present

## 2021-03-07 DIAGNOSIS — O10919 Unspecified pre-existing hypertension complicating pregnancy, unspecified trimester: Secondary | ICD-10-CM

## 2021-03-07 DIAGNOSIS — O24111 Pre-existing diabetes mellitus, type 2, in pregnancy, first trimester: Secondary | ICD-10-CM

## 2021-03-07 DIAGNOSIS — E119 Type 2 diabetes mellitus without complications: Secondary | ICD-10-CM | POA: Diagnosis not present

## 2021-03-07 DIAGNOSIS — O281 Abnormal biochemical finding on antenatal screening of mother: Secondary | ICD-10-CM

## 2021-03-07 DIAGNOSIS — O24112 Pre-existing diabetes mellitus, type 2, in pregnancy, second trimester: Secondary | ICD-10-CM | POA: Diagnosis present

## 2021-03-07 LAB — POCT URINALYSIS DIP (DEVICE)
Bilirubin Urine: NEGATIVE
Glucose, UA: NEGATIVE mg/dL
Hgb urine dipstick: NEGATIVE
Ketones, ur: NEGATIVE mg/dL
Leukocytes,Ua: NEGATIVE
Nitrite: NEGATIVE
Protein, ur: NEGATIVE mg/dL
Specific Gravity, Urine: 1.025 (ref 1.005–1.030)
Urobilinogen, UA: 0.2 mg/dL (ref 0.0–1.0)
pH: 7 (ref 5.0–8.0)

## 2021-03-07 NOTE — Progress Notes (Signed)
MFM Note  Misty [name] was seen in consultation due to an elevated MSAFP of 4.36 MoM.  Her pregnancy has also been complicated by pregestational diabetes.  The patient reports that she was diagnosed with type 2 diabetes about 3 to 4 years ago.  She is in the process of getting an OmniPod to help with the management of her diabetes.  She had been treated with NovoLog and Lantus insulins.  Her hemoglobin A1c drawn earlier in her pregnancy was 9.1%.  A baseline P/C ratio did not show significant proteinuria.  She had a cell free DNA test drawn earlier in her pregnancy that showed a low risk for trisomy 21, 18, 13.  A female fetus is predicted.  The fetal growth and amniotic fluid level appeared appropriate for her gestational age today.  The patient was advised of the ultrasound findings that failed to reveal an anatomical cause for the increased MSAFP.  There were no sonographic signs of spina bifida or an anterior abdominal wall defect noted today.    She was advised regarding the limitations of ultrasound in the detection of all anomalies and that it will diagnose approximately 90% of neural tube defects.  The association of an elevated MSAFP with placental dysfunction which may manifest later in her pregnancy as fetal growth restriction along with with other adverse pregnancy outcomes such as fetal demise was discussed.  Due to the elevated MSAFP, the patient was offered and declined an amniocentesis today for definitive diagnosis of fetal aneuploidy and spina bifida.  The implications and management of diabetes in pregnancy was discussed in detail with the patient. She was advised that our goals for her blood glucose values are fasting values of 90-95 or less and two-hour postprandials of 120 or less.  Should the majority of her glucose values be above these values, adjustments should be made to her insulin regimen to help her achieve better glycemic control. The patient was advised that getting  her blood glucose values as close to these goals as possible would provide her with the most optimal obstetrical outcome.  The patient reports that she already has a fetal echocardiogram scheduled with [facility] on April 05, 2021.   Due to the unexplained elevated MSAFP and pregestational diabetes, we will continue to follow her with growth ultrasounds.  Twice-weekly fetal testing should be started at around 32 weeks.    Due to the elevated MSAFP level and pregestational diabetes, she should probably be delivered at between 37 to 39 weeks.  A follow-up ultrasound was scheduled in our office in 4 weeks.  The patient stated that all of her questions have been answered to her complete satisfaction.  The patient also met with our genetic counselor today to discuss the significance of the elevated MSAFP level.  A total of 45 minutes was spent counseling and coordinating the care for this patient.  Greater than 50% of the time was spent in direct face-to-face contact.  

## 2021-03-07 NOTE — Patient Instructions (Signed)

## 2021-03-07 NOTE — Progress Notes (Signed)
   Subjective:  Misty [name] is a 30 y.o. G2P0010 at [redacted]w[redacted]d being seen today for ongoing prenatal care.  She is currently monitored for the following issues for this high-risk pregnancy and has Chronic hypertension affecting pregnancy; Supervision of high risk pregnancy, antepartum; Type 2 diabetes mellitus affecting pregnancy in first trimester, antepartum; and Abnormal antenatal AFP screen on their problem list.  Patient reports no complaints.  Contractions: Not present. Vag. Bleeding: None.  Movement: Absent. Denies leaking of fluid.   The following portions of the patient's history were reviewed and updated as appropriate: allergies, current medications, past family history, past medical history, past social history, past surgical history and problem list. Problem list updated.  Objective:   Vitals:   03/07/21 1512  BP: 139/89  Pulse: 79  Weight: 196 lb 12.8 oz (89.3 kg)    Fetal Status: Fetal Heart Rate (bpm): 154   Movement: Absent     General:  Alert, oriented and cooperative. Patient is in no acute distress.  Skin: Skin is warm and dry. No rash noted.   Cardiovascular: Normal heart rate noted  Respiratory: Normal respiratory effort, no problems with respiration noted  Abdomen: Soft, gravid, appropriate for gestational age. Pain/Pressure: Absent     Pelvic: Vag. Bleeding: None     Cervical exam deferred        Extremities: Normal range of motion.     Mental Status: Normal mood and affect. Normal behavior. Normal judgment and thought content.   Urinalysis:      Assessment and Plan:  Pregnancy: G2P0010 at [redacted]w[redacted]d  1. Supervision of high risk pregnancy, antepartum BP borderline but normal FHR normal Anatomy scan done today, no abnormal findings  2. Type 2 diabetes mellitus affecting pregnancy in first trimester, antepartum Reports she has stopped taking insulin since her sugars were fine No insulin x2 days Log reviewed, data available until about a week ago and indeed  her sugars are very well controlled Discussed that her sugars are only good because she IS taking her insulin Discussed the importance, urged her to resume Has appt in two days w [name] to go over omnipod Fetal US scheduled for early next month  3. Chronic hypertension affecting pregnancy BP normal today  4. Abnormal antenatal AFP screen Counseled by MFM and genetics Declined amnio No explanation for elevation, they will continue to follow closely  Preterm labor symptoms and general obstetric precautions including but not limited to vaginal bleeding, contractions, leaking of fluid and fetal movement were reviewed in detail with the patient. Please refer to After Visit Summary for other counseling recommendations.  Return in 4 weeks (on 04/04/2021).   Venora Maples, MD

## 2021-03-09 ENCOUNTER — Encounter: Payer: 59 | Admitting: Obstetrics and Gynecology

## 2021-03-09 ENCOUNTER — Encounter (INDEPENDENT_AMBULATORY_CARE_PROVIDER_SITE_OTHER): Payer: Self-pay | Admitting: Primary Care

## 2021-03-09 ENCOUNTER — Other Ambulatory Visit: Payer: Self-pay

## 2021-03-09 ENCOUNTER — Other Ambulatory Visit: Payer: 59

## 2021-03-09 ENCOUNTER — Encounter (INDEPENDENT_AMBULATORY_CARE_PROVIDER_SITE_OTHER): Payer: 59 | Admitting: Primary Care

## 2021-03-09 ENCOUNTER — Encounter: Payer: Self-pay | Admitting: *Deleted

## 2021-03-09 VITALS — BP 126/82 | HR 80 | Temp 97.7°F | Ht 63.0 in | Wt 198.0 lb

## 2021-03-09 DIAGNOSIS — E119 Type 2 diabetes mellitus without complications: Secondary | ICD-10-CM

## 2021-03-09 LAB — POCT GLYCOSYLATED HEMOGLOBIN (HGB A1C): Hemoglobin A1C: 7 % — AB (ref 4.0–5.6)

## 2021-03-09 NOTE — Progress Notes (Signed)
Misty [name] was referred for genetic counseling due to increased maternal serum alpha fetoprotein (AFP). Misty [name] was seen following her ultrasound. She was not accompanied by a support person. The patient was counseled by Misty [name], genetic counseling intern, supervised by Misty Stack, MS, CGC.  AFP is a chemical that is produced in the fetus' liver. AFP is then released into the amniotic fluid surrounding the fetus and crosses the placenta into the maternal bloodstream where it can be measured via a blood test. The patient's AFP was elevated at 4. , which is a comparator using various factors such as maternal weight, maternal age, gestational age, and insulin dependence to assess AFP levels against a population norm. We reviewed the several reasons why AFP may be elevated in a pregnancy.   In a fetus with a neural tube defect (NTD), AFP can be increased. A neural tube defect, such as spina bifida, is an opening along the fetus' spine that leaves parts of the spine or brain exposed. Openings can happen anywhere along the spine, including at the base of the skull. NTDs can result in paralysis, bladder/bowel dysfunction, excess fluid in the brain, and scoliosis. Severity of features is often determined by the size and location of the opening along the spine. An opening on the front side of the fetus' body, or an abdominal wall defect, could also cause increased AFP levels. An abdominal wall defect results in intestines (and sometimes other organs) being on the outside of the body. Because the gut tube is not in the correct position, there is also concern for underdevelopment of the lungs and heart. Both neural tube defects and abdominal wall defects can be detected on ultrasound in about 90% of cases. In Misty [name]'s case, neither were seen on today's ultrasound, which is reassuring.  Other causes for elevated AFP include incorrect dating of the pregnancy, multiple gestation, fetal kidney or urinary  tract problems, bleeding or trauma to the abdomen, or placental problems. In the case of placental abnormalities, there is increased risk for perinatal complications such as miscarriage, intrauterine growth restriction (IUGR), preterm labor, and pre-eclampsia (high blood pressure). For this reason, Misty [name] will be seen between 28-[redacted] weeks gestation for a third trimester growth scan.   Last but not least, elevated AFP may be high for no discernable reason. This could result in a false positive. Some pregnant people have a normal variation of higher AFP levels.   Misty [name] was counseled about the option of diagnostic amniocentesis. Using ultrasound guidance, a needle can be placed into the maternal abdomen to draw out a sample of the amniotic fluid surrounding the fetus. Amniocentesis can measure amniotic AFP levels as well as acetylcholinesterase (AChE) levels to detect neural tube defects and abdominal wall defects with greater than 98% accuracy. This testing option would provide Misty [name] with the most information about the chance for these conditions during pregnancy. Because it is an invasive procedure, amniocentesis comes with a 1 in 500 risk of procedure related complications that could lead to miscarriage. Misty [name] opted to forgo amniocentesis at this time. She plans to continue with ultrasounds and will contact us with any further questions or concerns as her pregnancy progresses.  Misty [name] family history was reviewed as well, and concerns were addressed as follows: Misty [name] reported a maternal cousin who passed away from Shaken Baby syndrome/SIDS at 70 months of age. If trauma was suspected, recurrence risk for SIDS is not greatly increased. Unexplained SIDS can have a genetic component  and may result in a marginally increased risk for recurrence for other family members depending upon the cause. Misty [name] has a full brother with Sickle Cell disease. Misty [name] was found not to be a  carrier for sickle cell trait, so we do not expect her pregnancy is at risk for this condition. Misty [name] also reports a personal and maternal family history of Type 2 Diabetes. Adult onset diabetes can have both genetic and environmental contributions. Misty [name] was counseled that while a genetic test for diabetes is not available, she and her children can focus on promoting health through modifiable environmental factors, such as diet and exercise. Due to Misty [name]'s Type 2 Diabetes and the associated increased risk for structural birth defects, she has also been referred for a fetal echocardiogram at [facility].      In the pregnancy history, there were no complications reported or exposures to alcohol, tobacco or recreational drugs. This is her second pregnancy, the first ended in early miscarriage.   Plan of care: Follow up u/s at [facility] in 4 weeks. Growth u/s in third trimester. Fetal echocardiogram recommended due to diabetes. Patient declined amniocentesis which was offered due to elevated msAFP.  Misty Anderson, MS, CGC

## 2021-03-09 NOTE — Progress Notes (Signed)
   [facility]        ERROR Patient managed by OBGYN               This note has been created with Education officer, environmental. Any transcriptional errors are unintentional.   Grayce Sessions, NP 03/09/2021, 2:56 PM

## 2021-03-18 ENCOUNTER — Other Ambulatory Visit: Payer: Self-pay

## 2021-03-18 ENCOUNTER — Inpatient Hospital Stay (HOSPITAL_COMMUNITY)
Admission: AD | Admit: 2021-03-18 | Discharge: 2021-03-18 | Disposition: A | Discharge status: Home / Self Care | Payer: 59 | Attending: Obstetrics & Gynecology | Admitting: Obstetrics & Gynecology

## 2021-03-18 ENCOUNTER — Encounter (HOSPITAL_COMMUNITY): Payer: Self-pay | Admitting: Obstetrics & Gynecology

## 2021-03-18 DIAGNOSIS — Z3A20 20 weeks gestation of pregnancy: Secondary | ICD-10-CM | POA: Diagnosis not present

## 2021-03-18 DIAGNOSIS — E119 Type 2 diabetes mellitus without complications: Secondary | ICD-10-CM | POA: Diagnosis not present

## 2021-03-18 DIAGNOSIS — O219 Vomiting of pregnancy, unspecified: Secondary | ICD-10-CM | POA: Diagnosis not present

## 2021-03-18 DIAGNOSIS — Z7982 Long term (current) use of aspirin: Secondary | ICD-10-CM | POA: Insufficient documentation

## 2021-03-18 DIAGNOSIS — O24312 Unspecified pre-existing diabetes mellitus in pregnancy, second trimester: Secondary | ICD-10-CM | POA: Diagnosis not present

## 2021-03-18 DIAGNOSIS — Z87891 Personal history of nicotine dependence: Secondary | ICD-10-CM | POA: Diagnosis not present

## 2021-03-18 DIAGNOSIS — O10919 Unspecified pre-existing hypertension complicating pregnancy, unspecified trimester: Secondary | ICD-10-CM

## 2021-03-18 DIAGNOSIS — Z794 Long term (current) use of insulin: Secondary | ICD-10-CM | POA: Insufficient documentation

## 2021-03-18 DIAGNOSIS — O24112 Pre-existing diabetes mellitus, type 2, in pregnancy, second trimester: Secondary | ICD-10-CM | POA: Diagnosis not present

## 2021-03-18 DIAGNOSIS — O10912 Unspecified pre-existing hypertension complicating pregnancy, second trimester: Secondary | ICD-10-CM | POA: Diagnosis not present

## 2021-03-18 LAB — URINALYSIS, ROUTINE W REFLEX MICROSCOPIC
Bilirubin Urine: NEGATIVE
Glucose, UA: NEGATIVE mg/dL
Hgb urine dipstick: NEGATIVE
Ketones, ur: NEGATIVE mg/dL
Leukocytes,Ua: NEGATIVE
Nitrite: NEGATIVE
Protein, ur: NEGATIVE mg/dL
Specific Gravity, Urine: 1.018 (ref 1.005–1.030)
pH: 7 (ref 5.0–8.0)

## 2021-03-18 LAB — CBC
HCT: 31 % — ABNORMAL LOW (ref 36.0–46.0)
Hemoglobin: 10.8 g/dL — ABNORMAL LOW (ref 12.0–15.0)
MCH: 32.5 pg (ref 26.0–34.0)
MCHC: 34.8 g/dL (ref 30.0–36.0)
MCV: 93.4 fL (ref 80.0–100.0)
Platelets: 278 10*3/uL (ref 150–400)
RBC: 3.32 MIL/uL — ABNORMAL LOW (ref 3.87–5.11)
RDW: 13.2 % (ref 11.5–15.5)
WBC: 6.5 10*3/uL (ref 4.0–10.5)
nRBC: 0 % (ref 0.0–0.2)

## 2021-03-18 LAB — GLUCOSE, CAPILLARY: Glucose-Capillary: 73 mg/dL (ref 70–99)

## 2021-03-18 LAB — COMPREHENSIVE METABOLIC PANEL
ALT: 21 U/L (ref 0–44)
AST: 26 U/L (ref 15–41)
Albumin: 3.4 g/dL — ABNORMAL LOW (ref 3.5–5.0)
Alkaline Phosphatase: 68 U/L (ref 38–126)
Anion gap: 9 (ref 5–15)
BUN: 5 mg/dL — ABNORMAL LOW (ref 6–20)
CO2: 22 mmol/L (ref 22–32)
Calcium: 9.2 mg/dL (ref 8.9–10.3)
Chloride: 102 mmol/L (ref 98–111)
Creatinine, Ser: 0.69 mg/dL (ref 0.44–1.00)
GFR, Estimated: >60 mL/min (ref >60)
Glucose, Bld: 114 mg/dL — ABNORMAL HIGH (ref 70–99)
Potassium: 4 mmol/L (ref 3.5–5.1)
Sodium: 133 mmol/L — ABNORMAL LOW (ref 135–145)
Total Bilirubin: 0.4 mg/dL (ref 0.3–1.2)
Total Protein: 7 g/dL (ref 6.5–8.1)

## 2021-03-18 MED ORDER — ONDANSETRON 4 MG PO TBDP: 4.0000 mg | ORAL_TABLET | Freq: Three times a day (TID) | ORAL | 1 refills | Status: DC | PRN

## 2021-03-18 MED ORDER — ONDANSETRON 4 MG PO TBDP
8.0000 mg | ORAL_TABLET | Freq: Once | ORAL | Status: AC
  Administered 2021-03-18: 8 mg via ORAL
  Filled 2021-03-18: qty 2

## 2021-03-18 MED ORDER — ACETAMINOPHEN 500 MG PO TABS
1000.0000 mg | ORAL_TABLET | Freq: Once | ORAL | Status: AC
  Administered 2021-03-18: 1000 mg via ORAL
  Filled 2021-03-18: qty 2

## 2021-03-18 NOTE — MAU Provider Note (Signed)
History     CSN: [id number]  Arrival date and time: 03/18/21 9528   Event Date/Time   First Provider Initiated Contact with Patient 03/18/21 1847      Chief Complaint  Patient presents with   Weakness   HPI Misty [name] is a 30 y.o. G2P0010 at 51w4dwho presents via EMS stating she doesn't feel good. She states since this afternoon she has felt weak and like her vision is blurry. She denies any blurred vision currently. She denies any pain. She denies any vaginal bleeding or discharge. She reports she attempted to eat pizza and salad today but wasn't able to eat because she didn't feel good. She has not eaten any other foods. She reports 3 cups of water today. She reports she has continued to take her insulin throughout the day despite not eating. She denies any cough, shortness of breath, sore throat or headache.   OB History     Gravida  2   Para  0   Term  0   Preterm  0   AB  1   Living  0      SAB  1   IAB  0   Ectopic  0   Multiple  0   Live Births              Past Medical History:  Diagnosis Date   Abscess 09/02/2020   Allergy    Anemia 2015   Asthma    as child   Diabetes mellitus without complication ([facility] [address]  DKA (diabetic ketoacidosis) ([facility] 09/02/2020   Eczema    Hypertension 09/2013    Past Surgical History:  Procedure Laterality Date   Abcess on back      Family History  Problem Relation Age of Onset   Hypertension Mother    Heart disease Mother 442      CHF   Diabetes Mother    Sickle cell anemia Brother    Hypertension Maternal Grandfather    Diabetes Maternal Grandfather    Heart disease Maternal Grandfather    Healthy Father    Diabetes Maternal Grandmother    Heart disease Maternal Grandmother     Social History   Tobacco Use   Smoking status: Former    Packs/day: 0.50    Years: 2.00    Pack years: 1.00    Types: Cigarettes    Quit date: 09/08/2013    Years since quitting: 7.5   Smokeless tobacco:  Never  Vaping Use   Vaping Use: Never used  Substance Use Topics   Alcohol use: No    Alcohol/week: 0.0 standard drinks   Drug use: No    Allergies:  Allergies  Allergen Reactions   Iodides Anaphylaxis and Itching   Morphine And Related Anaphylaxis and Itching   Shellfish Allergy Anaphylaxis and Itching    Pt reports "watery eyes"   Ultram [Tramadol Hcl] Hives    Hives and swollen lips     Medications Prior to Admission  Medication Sig Dispense Refill Last Dose   insulin glargine (LANTUS) 100 UNIT/ML Solostar Pen Inject 14 Units into the skin 2 (two) times daily. 12 units with breakfast and 12 units just before bed 15 mL 11 03/18/2021 at 1200   aspirin EC 81 MG tablet Take 1 tablet (81 mg total) by mouth daily. Swallow whole. (Patient not taking: No sig reported) 90 tablet 3    blood glucose meter kit and supplies Dispense based on patient  and insurance preference. Use up to four times daily as directed. (FOR ICD-10 E10.9, E11.9). 1 each 0    Blood Pressure Monitoring DEVI 1 each by Does not apply route once a week. 1 each 0    Continuous Blood Gluc Sensor (DEXCOM G6 SENSOR) MISC 1 each every 10 days 3 each 6    Continuous Blood Gluc Transmit (DEXCOM G6 TRANSMITTER) MISC 1 each by Does not apply route every 3 (three) months. 1 each 3    insulin aspart (NOVOLOG) 100 UNIT/ML injection For use in Omnipod.  Current TDD 66 units. Patient to adjust per MD orders. (Patient not taking: No sig reported) 30 mL 12    Insulin Pen Needle 31G X 5 MM MISC 1 Device by Does not apply route 4 (four) times daily. For use with insulin pens 100 each 11    Prenatal Vit-Fe Fumarate-FA (PRENATAL VITAMINS) 28-0.8 MG TABS Take 1 tablet by mouth daily. 30 tablet 11    promethazine (PHENERGAN) 25 MG tablet Take 1 tablet (25 mg total) by mouth every 6 (six) hours as needed for nausea or vomiting. (Patient not taking: No sig reported) 30 tablet 0     Review of Systems  Constitutional: Negative.  Negative for  fatigue and fever.  HENT: Negative.    Respiratory: Negative.  Negative for shortness of breath.   Cardiovascular: Negative.  Negative for chest pain.  Gastrointestinal: Negative.  Negative for abdominal pain, constipation, diarrhea, nausea and vomiting.  Genitourinary: Negative.  Negative for dysuria, vaginal bleeding and vaginal discharge.  Neurological:  Positive for weakness. Negative for dizziness and headaches.  Physical Exam   Blood pressure (!) 149/88, pulse 88, temperature 99.3 F (37.4 C), temperature source Oral, resp. rate 18, height 5' 3" (1.6 m), weight 90.1 kg, last menstrual period 10/14/2020, SpO2 99 %.  Physical Exam Vitals and nursing note reviewed.  Constitutional:      General: She is not in acute distress.    Appearance: She is well-developed.  HENT:     Head: Normocephalic.  Eyes:     Pupils: Pupils are equal, round, and reactive to light.  Cardiovascular:     Rate and Rhythm: Normal rate and regular rhythm.     Heart sounds: Normal heart sounds.  Pulmonary:     Effort: Pulmonary effort is normal. No respiratory distress.     Breath sounds: Normal breath sounds.  Abdominal:     General: Bowel sounds are normal. There is no distension.     Palpations: Abdomen is soft.     Tenderness: There is no abdominal tenderness.  Skin:    General: Skin is warm and dry.  Neurological:     Mental Status: She is alert and oriented to person, place, and time.  Psychiatric:        Mood and Affect: Mood normal.        Behavior: Behavior normal.        Thought Content: Thought content normal.        Judgment: Judgment normal.   FHT: 170 bpm  MAU Course  Procedures Results for orders placed or performed during the hospital encounter of 03/18/21 (from the past 24 hour(s))  Urinalysis, Routine w reflex microscopic     Status: None   Collection Time: 03/18/21  6:26 PM  Result Value Ref Range   Color, Urine YELLOW YELLOW   APPearance CLEAR CLEAR   Specific Gravity,  Urine 1.018 1.005 - 1.030   pH 7.0 5.0 - 8.0  Glucose, UA NEGATIVE NEGATIVE mg/dL   Hgb urine dipstick NEGATIVE NEGATIVE   Bilirubin Urine NEGATIVE NEGATIVE   Ketones, ur NEGATIVE NEGATIVE mg/dL   Protein, ur NEGATIVE NEGATIVE mg/dL   Nitrite NEGATIVE NEGATIVE   Leukocytes,Ua NEGATIVE NEGATIVE  Glucose, capillary     Status: None   Collection Time: 03/18/21  6:41 PM  Result Value Ref Range   Glucose-Capillary 73 70 - 99 mg/dL    MDM UA CBG CBC, CMP Zofran 26m ODT Meal provided for patient while in MAU- patient refusing to eat in MAU despite antiemetics. States she doesn't feel like eating.   Lengthy discussion with patient of importance of proper nutrition in pregnancy especially with preexisting diabetes. Discussed importance of checking CBGs regularly and eating with insulin administration to prevent hypoglycemia. Patient still refused to eat.   Patient requesting medication for fever. Reassured patient that she is afebrile. Patient states she feels "warm like I have a fever" while laying under 4 warm blankets that she requested. Offered to remove some blankets and patient refused.   Reassurance provided that from a medical stand point, she is stable. Encouraged increasing PO intake as previously discussed. Patient denies any questions at discharge.   Assessment and Plan   1. Nausea/vomiting in pregnancy   2. [redacted] weeks gestation of pregnancy   3. Chronic hypertension affecting pregnancy   4. Preexisting diabetes complicating pregnancy in second trimester, antepartum    -Discharge home in stable condition -Rx for zofran sent to patient's pharmacy -Second trimester precautions discussed -Patient advised to follow-up with OB as scheduled for prenatal care -Patient may return to MAU as needed or if her condition were to change or worsen   CGrover7/16/2022, 6:47 PM

## 2021-03-18 NOTE — Discharge Instructions (Signed)

## 2021-03-18 NOTE — MAU Note (Signed)
Presents via EMS dur to feeling bad, states she feels weak.  Also states that BS is "up" but hasn't checked CBG today until done by EMS.  Also says BP is "up", but wasn't checked until bone by EMS.  Denies VB or LOF.  Reports hasn't begun to FM.

## 2021-03-22 ENCOUNTER — Ambulatory Visit: Payer: Self-pay

## 2021-04-04 ENCOUNTER — Encounter: Payer: Self-pay | Admitting: *Deleted

## 2021-04-04 ENCOUNTER — Ambulatory Visit: Payer: 59 | Admitting: *Deleted

## 2021-04-04 ENCOUNTER — Other Ambulatory Visit: Payer: Self-pay

## 2021-04-04 ENCOUNTER — Ambulatory Visit: Payer: 59 | Attending: Obstetrics

## 2021-04-04 ENCOUNTER — Other Ambulatory Visit: Payer: Self-pay | Admitting: *Deleted

## 2021-04-04 VITALS — BP 134/79 | HR 60

## 2021-04-04 DIAGNOSIS — O24112 Pre-existing diabetes mellitus, type 2, in pregnancy, second trimester: Secondary | ICD-10-CM | POA: Insufficient documentation

## 2021-04-04 DIAGNOSIS — O0990 Supervision of high risk pregnancy, unspecified, unspecified trimester: Secondary | ICD-10-CM

## 2021-04-04 DIAGNOSIS — O280 Abnormal hematological finding on antenatal screening of mother: Secondary | ICD-10-CM | POA: Insufficient documentation

## 2021-04-04 DIAGNOSIS — Z362 Encounter for other antenatal screening follow-up: Secondary | ICD-10-CM

## 2021-04-04 DIAGNOSIS — E119 Type 2 diabetes mellitus without complications: Secondary | ICD-10-CM

## 2021-04-04 DIAGNOSIS — O162 Unspecified maternal hypertension, second trimester: Secondary | ICD-10-CM | POA: Diagnosis not present

## 2021-04-04 DIAGNOSIS — O321XX0 Maternal care for breech presentation, not applicable or unspecified: Secondary | ICD-10-CM

## 2021-04-04 DIAGNOSIS — Z3A23 23 weeks gestation of pregnancy: Secondary | ICD-10-CM

## 2021-04-04 DIAGNOSIS — O281 Abnormal biochemical finding on antenatal screening of mother: Secondary | ICD-10-CM

## 2021-04-04 DIAGNOSIS — O10912 Unspecified pre-existing hypertension complicating pregnancy, second trimester: Secondary | ICD-10-CM

## 2021-04-05 DIAGNOSIS — Q211 Atrial septal defect: Secondary | ICD-10-CM | POA: Diagnosis not present

## 2021-04-05 DIAGNOSIS — O24112 Pre-existing diabetes mellitus, type 2, in pregnancy, second trimester: Secondary | ICD-10-CM | POA: Diagnosis not present

## 2021-04-05 DIAGNOSIS — Q250 Patent ductus arteriosus: Secondary | ICD-10-CM | POA: Diagnosis not present

## 2021-04-06 ENCOUNTER — Other Ambulatory Visit: Payer: Self-pay

## 2021-04-06 ENCOUNTER — Ambulatory Visit (INDEPENDENT_AMBULATORY_CARE_PROVIDER_SITE_OTHER): Payer: 59 | Admitting: Obstetrics & Gynecology

## 2021-04-06 VITALS — BP 121/84 | HR 98 | Wt 198.8 lb

## 2021-04-06 DIAGNOSIS — O24111 Pre-existing diabetes mellitus, type 2, in pregnancy, first trimester: Secondary | ICD-10-CM

## 2021-04-06 DIAGNOSIS — O0990 Supervision of high risk pregnancy, unspecified, unspecified trimester: Secondary | ICD-10-CM

## 2021-04-06 DIAGNOSIS — O280 Abnormal hematological finding on antenatal screening of mother: Secondary | ICD-10-CM

## 2021-04-06 DIAGNOSIS — Z3A23 23 weeks gestation of pregnancy: Secondary | ICD-10-CM

## 2021-04-06 DIAGNOSIS — O10919 Unspecified pre-existing hypertension complicating pregnancy, unspecified trimester: Secondary | ICD-10-CM

## 2021-04-06 MED ORDER — INSULIN GLARGINE 100 UNIT/ML SOLOSTAR PEN: 16.0000 [IU] | PEN_INJECTOR | Freq: Two times a day (BID) | SUBCUTANEOUS | 11 refills | Status: DC

## 2021-04-06 NOTE — Progress Notes (Signed)
   PRENATAL VISIT NOTE  Subjective:  Misty [name] is a 30 y.o. G2P0010 at [redacted]w[redacted]d being seen today for ongoing prenatal care.  She is currently monitored for the following issues for this high-risk pregnancy and has Chronic hypertension affecting pregnancy; Supervision of high risk pregnancy, antepartum; Type 2 diabetes mellitus complicating pregnancy in second trimester, antepartum; and Abnormal antenatal AFP screen on their problem list.  Patient reports no complaints.  Contractions: Not present. Vag. Bleeding: None.  Movement: Present. Denies leaking of fluid.   The following portions of the patient's history were reviewed and updated as appropriate: allergies, current medications, past family history, past medical history, past social history, past surgical history and problem list.   Objective:   Vitals:   04/06/21 1043  BP: 121/84  Pulse: 98  Weight: 198 lb 12.8 oz (90.2 kg)    Fetal Status: Fetal Heart Rate (bpm): 163   Movement: Present     General:  Alert, oriented and cooperative. Patient is in no acute distress.  Skin: Skin is warm and dry. No rash noted.   Cardiovascular: Normal heart rate noted  Respiratory: Normal respiratory effort, no problems with respiration noted  Abdomen: Soft, gravid, appropriate for gestational age.  Pain/Pressure: Absent     Pelvic: Cervical exam deferred        Extremities: Normal range of motion.  Edema: None  Mental Status: Normal mood and affect. Normal behavior. Normal judgment and thought content.   Assessment and Plan:  Pregnancy: G2P0010 at [redacted]w[redacted]d  1. Chronic hypertension affecting pregnancy BP nml today  2. Abnormal antenatal AFP screen Saw genetics; will monitor with serial [place] already needed from DM type 2  5. Type 2 diabetes mellitus affecting pregnancy in first trimester, antepartum Pt checking fastings and one other CBG in the evening.  The fastings are 100s.  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  I asked her to check after dinner and after all meals on weekends when she is not working.  Pt is texting with [name] (diabetes coordinator) and is setting up appt to dig in further to cbgs.  I encouraged patient to write down her numbers, bring her meter and note exactly when she had eaten in reference to time of cbg.  I increased Lantus from 14 to 16.  Pt is not sure how much Novolog she takes.  Pt had fetal echo and she reports in nml.  Waiting for report.   - insulin glargine (LANTUS) 100 UNIT/ML Solostar Pen; Inject 16 Units into the skin 2 (two) times daily. 16 units with breakfast and 16 units just before bed  Dispense: 15 mL; Refill: 11  Preterm labor symptoms and general obstetric precautions including but not limited to vaginal bleeding, contractions, leaking of fluid and fetal movement were reviewed in detail with the patient. Please refer to After Visit Summary for other counseling recommendations.   Return in about 2 years (around 04/07/2023).  Future Appointments  Date Time Provider Department Center  04/13/2021 11:15 AM [facility]  04/21/2021  8:55 AM Venora Maples, MD [facility]  05/03/2021  9:30 AM [facility] NURSE [facility]  05/03/2021  9:45 AM [facility] US5 [facility]    Elsie Lincoln, MD

## 2021-04-13 ENCOUNTER — Other Ambulatory Visit: Payer: Self-pay

## 2021-04-13 ENCOUNTER — Encounter: Payer: 59 | Attending: Obstetrics & Gynecology | Admitting: Registered"

## 2021-04-13 ENCOUNTER — Ambulatory Visit: Payer: 59 | Admitting: Registered"

## 2021-04-13 DIAGNOSIS — O24119 Pre-existing diabetes mellitus, type 2, in pregnancy, unspecified trimester: Secondary | ICD-10-CM | POA: Diagnosis present

## 2021-04-13 DIAGNOSIS — O24112 Pre-existing diabetes mellitus, type 2, in pregnancy, second trimester: Secondary | ICD-10-CM

## 2021-04-13 NOTE — Progress Notes (Signed)
Patient was seen on 04/13/21 for follow-up Gestational Diabetes. EDD Aug 01, 2021. 1030-1100  Pt states Lantus 16 units bid;  Pt states she has 2 types of insulin pens at home. Not sure how much Novolog, if any, with dinner; might be 12 units.  Patient states she is testing blood glucose as directed pre breakfast and 2 hours after each meal. Patient states she does not have blood glucose Log. Pt states her readings from memory: FBS 100-110 mg/dL  Postprandial 809-98 mg/dL  Although patient has Dexcom Rx, patient has been using glucometer because she reports that she had difficulty attaching the sensor and used all 3 sensors, but each time getting excess bleeding at the insertion site, so threw them away.  RD provided a new sample Dexcom sensor and patient successfully attached sensor. There was a very small amount of blood at the insertion site. Per the Dexcom G6 website, it is acceptable and will not affect the readings.  Patient was instructed to look at her reader and record her blood sugar values the same as if she were using the glucometer. Pt states she will record her readings and take to all her MD appointments.  Patient states she has her omnipod supplies and has kept the insulin vial in the refrigerator. Pt states her work has changed and more flexible now and would like to reschedule the training to use Omnipod. [name] contacted [name] and will work on getting that scheduled.  The following learning objectives reviewed during follow-up visit:  Role of CDCES to review full Dexcom G6 data MD use of blood glucose log for medical care and medication management  Dexcom G6 sample provided: Lot [id number]; exp 01/16/22  Plan:  Record your blood sugar on the log sheet and take to all your appointments.   Patient instructed to monitor glucose levels: FBS: 70 - 95 mg/dl 2 hour: <397 mg/dl  Patient received the following handouts: Glucose log  Patient will be seen for follow-up in  2 weeks or as needed.

## 2021-04-21 ENCOUNTER — Ambulatory Visit (INDEPENDENT_AMBULATORY_CARE_PROVIDER_SITE_OTHER): Payer: 59 | Admitting: Family Medicine

## 2021-04-21 ENCOUNTER — Encounter: Payer: Self-pay | Admitting: Family Medicine

## 2021-04-21 ENCOUNTER — Other Ambulatory Visit: Payer: Self-pay

## 2021-04-21 VITALS — BP 135/84 | HR 80 | Wt 205.4 lb

## 2021-04-21 DIAGNOSIS — E119 Type 2 diabetes mellitus without complications: Secondary | ICD-10-CM

## 2021-04-21 DIAGNOSIS — O0990 Supervision of high risk pregnancy, unspecified, unspecified trimester: Secondary | ICD-10-CM

## 2021-04-21 DIAGNOSIS — Z3A25 25 weeks gestation of pregnancy: Secondary | ICD-10-CM

## 2021-04-21 DIAGNOSIS — O24112 Pre-existing diabetes mellitus, type 2, in pregnancy, second trimester: Secondary | ICD-10-CM

## 2021-04-21 DIAGNOSIS — O280 Abnormal hematological finding on antenatal screening of mother: Secondary | ICD-10-CM

## 2021-04-21 DIAGNOSIS — O10919 Unspecified pre-existing hypertension complicating pregnancy, unspecified trimester: Secondary | ICD-10-CM

## 2021-04-21 DIAGNOSIS — O10912 Unspecified pre-existing hypertension complicating pregnancy, second trimester: Secondary | ICD-10-CM

## 2021-04-21 DIAGNOSIS — Z794 Long term (current) use of insulin: Secondary | ICD-10-CM

## 2021-04-21 NOTE — Progress Notes (Signed)
I connected with Misty [name] 04/21/21 at  8:55 AM EDT by: MyChart video and verified that I am speaking with the correct person using two identifiers.  Patient is located at [facility] and provider is located at home.     The purpose of this virtual visit is to provide medical care while limiting exposure to the novel coronavirus. I discussed the limitations, risks, security and privacy concerns of performing an evaluation and management service by MyChart video and the availability of in person appointments. I also discussed with the patient that there may be a patient responsible charge related to this service. By engaging in this virtual visit, you consent to the provision of healthcare.  Additionally, you authorize for your insurance to be billed for the services provided during this visit.  The patient expressed understanding and agreed to proceed.  The following staff members participated in the virtual visit:  Venora Maples, MD/MPH Attending Family Medicine Physician, Faculty [facility] for [facility], [facility]     PRENATAL VISIT NOTE  Subjective:  Misty [name] is a 30 y.o. G2P0010 at [redacted]w[redacted]d  for phone visit for ongoing prenatal care.  She is currently monitored for the following issues for this high-risk pregnancy and has Chronic hypertension affecting pregnancy; Supervision of high risk pregnancy, antepartum; Type 2 diabetes mellitus complicating pregnancy in second trimester, antepartum; and Abnormal antenatal AFP screen on their problem list.  Patient reports no complaints.  Contractions: Not present. Vag. Bleeding: None.  Movement: Present. Denies leaking of fluid.   The following portions of the patient's history were reviewed and updated as appropriate: allergies, current medications, past family history, past medical history, past social history, past surgical history and problem list.   Objective:   Vitals:   04/21/21 0914  04/21/21 0928  BP: (!) 155/89 135/84  Pulse: 72 80  Weight: 205 lb 6.4 oz (93.2 kg)    Self-Obtained  Fetal Status: Fetal Heart Rate (bpm): 163   Movement: Present     Assessment and Plan:  Pregnancy: G2P0010 at [redacted]w[redacted]d 1. Supervision of high risk pregnancy, antepartum FHR normal BP elevated, see below  2. Type 2 diabetes mellitus complicating pregnancy in second trimester, antepartum Does not bring sugar log Reports fastings in the 110's Only checking pre-meal sugars Reports she takes Lantus 16u BID and novolog with meals, gives herself anywhere from 8-20 units depending on what her pre-prandial values is Asked her to increase lantus to 20u BID and to give set dose of novolog 15u TID Also reviewed proper way to take glucose measurements She has dexcom but has been having multiple issues with it, unable to review that log Will follow up sugars at next visit Also has training scheduled for omnipod, hopefully this will help both tracking of sugars as well as control Start antenatal testing at 32 weeks  3. Chronic hypertension affecting pregnancy BP elevated, on first check, normotensive on recheck at end of visit Defer meds at this time Taking ASA  4. Abnormal antenatal AFP screen S/p counseling by MFM, declined amnio, [place] normal without any identifiable cause Has follow up scan later this month  Preterm labor symptoms and general obstetric precautions including but not limited to vaginal bleeding, contractions, leaking of fluid and fetal movement were reviewed in detail with the patient.  Return in about 2 weeks (around 05/05/2021) for [facility], ob visit, needs MD.  Future Appointments  Date Time Provider Department Center  05/03/2021  8:15 AM [facility]  05/03/2021  9:30 AM [facility] NURSE [facility]  05/03/2021  9:45 AM [facility] US5 [facility]     Time spent on virtual visit: 15 minutes  Venora Maples, MD

## 2021-04-28 ENCOUNTER — Inpatient Hospital Stay (HOSPITAL_COMMUNITY)
Admission: AD | Admit: 2021-04-28 | Discharge: 2021-04-28 | Disposition: A | Discharge status: Home / Self Care | Payer: 59 | Attending: Obstetrics & Gynecology | Admitting: Obstetrics & Gynecology

## 2021-04-28 ENCOUNTER — Encounter (HOSPITAL_COMMUNITY): Payer: Self-pay | Admitting: Obstetrics & Gynecology

## 2021-04-28 ENCOUNTER — Other Ambulatory Visit: Payer: Self-pay

## 2021-04-28 DIAGNOSIS — E119 Type 2 diabetes mellitus without complications: Secondary | ICD-10-CM

## 2021-04-28 DIAGNOSIS — O0992 Supervision of high risk pregnancy, unspecified, second trimester: Secondary | ICD-10-CM | POA: Diagnosis present

## 2021-04-28 DIAGNOSIS — Z794 Long term (current) use of insulin: Secondary | ICD-10-CM | POA: Diagnosis not present

## 2021-04-28 DIAGNOSIS — O26892 Other specified pregnancy related conditions, second trimester: Secondary | ICD-10-CM | POA: Diagnosis not present

## 2021-04-28 DIAGNOSIS — Z3A26 26 weeks gestation of pregnancy: Secondary | ICD-10-CM | POA: Diagnosis not present

## 2021-04-28 DIAGNOSIS — Z7982 Long term (current) use of aspirin: Secondary | ICD-10-CM | POA: Diagnosis not present

## 2021-04-28 DIAGNOSIS — O10912 Unspecified pre-existing hypertension complicating pregnancy, second trimester: Secondary | ICD-10-CM | POA: Insufficient documentation

## 2021-04-28 DIAGNOSIS — O10012 Pre-existing essential hypertension complicating pregnancy, second trimester: Secondary | ICD-10-CM | POA: Diagnosis not present

## 2021-04-28 DIAGNOSIS — O99353 Diseases of the nervous system complicating pregnancy, third trimester: Secondary | ICD-10-CM | POA: Diagnosis not present

## 2021-04-28 DIAGNOSIS — O24112 Pre-existing diabetes mellitus, type 2, in pregnancy, second trimester: Secondary | ICD-10-CM | POA: Diagnosis not present

## 2021-04-28 DIAGNOSIS — G44209 Tension-type headache, unspecified, not intractable: Secondary | ICD-10-CM | POA: Diagnosis not present

## 2021-04-28 DIAGNOSIS — O280 Abnormal hematological finding on antenatal screening of mother: Secondary | ICD-10-CM

## 2021-04-28 DIAGNOSIS — O0990 Supervision of high risk pregnancy, unspecified, unspecified trimester: Secondary | ICD-10-CM

## 2021-04-28 DIAGNOSIS — O10919 Unspecified pre-existing hypertension complicating pregnancy, unspecified trimester: Secondary | ICD-10-CM

## 2021-04-28 LAB — URINALYSIS, ROUTINE W REFLEX MICROSCOPIC
Bilirubin Urine: NEGATIVE
Glucose, UA: NEGATIVE mg/dL
Hgb urine dipstick: NEGATIVE
Ketones, ur: NEGATIVE mg/dL
Leukocytes,Ua: NEGATIVE
Nitrite: NEGATIVE
Protein, ur: NEGATIVE mg/dL
Specific Gravity, Urine: 1.01 (ref 1.005–1.030)
pH: 7 (ref 5.0–8.0)

## 2021-04-28 LAB — CBC
HCT: 34 % — ABNORMAL LOW (ref 36.0–46.0)
Hemoglobin: 11.9 g/dL — ABNORMAL LOW (ref 12.0–15.0)
MCH: 33.5 pg (ref 26.0–34.0)
MCHC: 35 g/dL (ref 30.0–36.0)
MCV: 95.8 fL (ref 80.0–100.0)
Platelets: 304 10*3/uL (ref 150–400)
RBC: 3.55 MIL/uL — ABNORMAL LOW (ref 3.87–5.11)
RDW: 12.9 % (ref 11.5–15.5)
WBC: 6.9 10*3/uL (ref 4.0–10.5)
nRBC: 0 % (ref 0.0–0.2)

## 2021-04-28 LAB — COMPREHENSIVE METABOLIC PANEL
ALT: 12 U/L (ref 0–44)
AST: 20 U/L (ref 15–41)
Albumin: 3.2 g/dL — ABNORMAL LOW (ref 3.5–5.0)
Alkaline Phosphatase: 94 U/L (ref 38–126)
Anion gap: 9 (ref 5–15)
BUN: 5 mg/dL — ABNORMAL LOW (ref 6–20)
CO2: 23 mmol/L (ref 22–32)
Calcium: 9.2 mg/dL (ref 8.9–10.3)
Chloride: 101 mmol/L (ref 98–111)
Creatinine, Ser: 0.67 mg/dL (ref 0.44–1.00)
GFR, Estimated: >60 mL/min (ref >60)
Glucose, Bld: 105 mg/dL — ABNORMAL HIGH (ref 70–99)
Potassium: 4 mmol/L (ref 3.5–5.1)
Sodium: 133 mmol/L — ABNORMAL LOW (ref 135–145)
Total Bilirubin: 0.5 mg/dL (ref 0.3–1.2)
Total Protein: 6.7 g/dL (ref 6.5–8.1)

## 2021-04-28 LAB — PROTEIN / CREATININE RATIO, URINE
Creatinine, Urine: 76.71 mg/dL
Protein Creatinine Ratio: 0.14 mg/mg{Cre} (ref 0.00–0.15)
Total Protein, Urine: 11 mg/dL

## 2021-04-28 MED ORDER — DIPHENHYDRAMINE HCL 50 MG/ML IJ SOLN
25.0000 mg | Freq: Once | INTRAMUSCULAR | Status: AC
  Administered 2021-04-28: 25 mg via INTRAVENOUS
  Filled 2021-04-28: qty 1

## 2021-04-28 MED ORDER — LACTATED RINGERS IV SOLN
INTRAVENOUS | Status: DC
  Administered 2021-04-28: 1000 mL via INTRAVENOUS

## 2021-04-28 MED ORDER — HYDRALAZINE HCL 20 MG/ML IJ SOLN: 10.0000 mg | INTRAMUSCULAR | Status: DC | PRN

## 2021-04-28 MED ORDER — LABETALOL HCL 5 MG/ML IV SOLN: 40.0000 mg | INTRAVENOUS | Status: DC | PRN

## 2021-04-28 MED ORDER — LABETALOL HCL 300 MG PO TABS: 300.0000 mg | ORAL_TABLET | Freq: Two times a day (BID) | ORAL | 1 refills | Status: DC

## 2021-04-28 MED ORDER — METOCLOPRAMIDE HCL 5 MG/ML IJ SOLN
10.0000 mg | Freq: Once | INTRAMUSCULAR | Status: AC
  Administered 2021-04-28: 10 mg via INTRAVENOUS
  Filled 2021-04-28: qty 2

## 2021-04-28 MED ORDER — LABETALOL HCL 5 MG/ML IV SOLN: 80.0000 mg | INTRAVENOUS | Status: DC | PRN

## 2021-04-28 MED ORDER — LABETALOL HCL 5 MG/ML IV SOLN
20.0000 mg | INTRAVENOUS | Status: DC | PRN
  Administered 2021-04-28: 20 mg via INTRAVENOUS
  Filled 2021-04-28: qty 4

## 2021-04-28 NOTE — MAU Provider Note (Signed)
History     CSN: [id number]  Arrival date and time: 04/28/21 1613   Event Date/Time   First Provider Initiated Contact with Patient 04/28/21 1755      Chief Complaint  Patient presents with   Headache   Emesis   Dizziness   30 y.o. G2P0010 _0 .3 wks with CHTN and T2DM presenting with HA, N/V, dizziness. Reports onset today around 1300. States she had watermelon and candied yams just prior to vomiting. Reports 5 episodes since. HA is frontal. Rates pain 8/10. Has not treated it. Associated sx are dizziness. Denies visual disturbances, RUQ pain, SOB, and CP. Reports good FM. States her BS was in 130s today.     OB History     Gravida  2   Para  0   Term  0   Preterm  0   AB  1   Living  0      SAB  1   IAB  0   Ectopic  0   Multiple  0   Live Births              Past Medical History:  Diagnosis Date   Abscess 09/02/2020   Allergy    Anemia 2015   Asthma    as child   Diabetes mellitus without complication ([place]) 6579   DKA (diabetic ketoacidosis) ([place]) 09/02/2020   Eczema    Hypertension 09/2013    Past Surgical History:  Procedure Laterality Date   Abcess on back      Family History  Problem Relation Age of Onset   Hypertension Mother    Heart disease Mother 43       CHF   Diabetes Mother    Sickle cell anemia Brother    Hypertension Maternal Grandfather    Diabetes Maternal Grandfather    Heart disease Maternal Grandfather    Healthy Father    Diabetes Maternal Grandmother    Heart disease Maternal Grandmother     Social History   Tobacco Use   Smoking status: Former    Packs/day: 0.50    Years: 2.00    Pack years: 1.00    Types: Cigarettes    Quit date: 09/08/2013    Years since quitting: 7.6   Smokeless tobacco: Never  Vaping Use   Vaping Use: Never used  Substance Use Topics   Alcohol use: No    Alcohol/week: 0.0 standard drinks   Drug use: No    Allergies:  Allergies  Allergen Reactions   Iodides Anaphylaxis  and Itching   Morphine And Related Anaphylaxis and Itching   Shellfish Allergy Anaphylaxis and Itching    Pt reports "watery eyes"   Ultram [Tramadol Hcl] Hives    Hives and swollen lips     Medications Prior to Admission  Medication Sig Dispense Refill Last Dose   insulin glargine (LANTUS) 100 UNIT/ML Solostar Pen Inject 16 Units into the skin 2 (two) times daily. 16 units with breakfast and 16 units just before bed 15 mL 11 04/28/2021 at 0750   Prenatal Vit-Fe Fumarate-FA (PRENATAL VITAMINS) 28-0.8 MG TABS Take 1 tablet by mouth daily. 30 tablet 11 04/28/2021 at 0750   aspirin EC 81 MG tablet Take 1 tablet (81 mg total) by mouth daily. Swallow whole. 90 tablet 3 04/26/2021   blood glucose meter kit and supplies Dispense based on patient and insurance preference. Use up to four times daily as directed. (FOR ICD-10 E10.9, E11.9). 1 each 0    Blood  Pressure Monitoring DEVI 1 each by Does not apply route once a week. 1 each 0    Continuous Blood Gluc Sensor (DEXCOM G6 SENSOR) MISC 1 each every 10 days 3 each 6    Continuous Blood Gluc Transmit (DEXCOM G6 TRANSMITTER) MISC 1 each by Does not apply route every 3 (three) months. 1 each 3    insulin aspart (NOVOLOG) 100 UNIT/ML injection For use in Omnipod.  Current TDD 66 units. Patient to adjust per MD orders. (Patient taking differently: For use in Omnipod.  Current TDD 66 units. Patient to adjust per MD orders.) 30 mL 12    Insulin Pen Needle 31G X 5 MM MISC 1 Device by Does not apply route 4 (four) times daily. For use with insulin pens 100 each 11    ondansetron (ZOFRAN ODT) 4 MG disintegrating tablet Take 1 tablet (4 mg total) by mouth every 8 (eight) hours as needed for nausea or vomiting. (Patient not taking: No sig reported) 30 tablet 1    promethazine (PHENERGAN) 25 MG tablet Take 1 tablet (25 mg total) by mouth every 6 (six) hours as needed for nausea or vomiting. (Patient not taking: No sig reported) 30 tablet 0     Review of Systems   Constitutional:  Negative for fever.  Eyes:  Negative for visual disturbance.  Respiratory:  Negative for shortness of breath.   Cardiovascular:  Negative for chest pain and leg swelling.  Gastrointestinal:  Positive for nausea and vomiting. Negative for abdominal pain and diarrhea.  Genitourinary:  Negative for vaginal bleeding.  Neurological:  Positive for headaches.  Physical Exam   Blood pressure (!) 147/80, pulse 82, temperature 98.3 F (36.8 C), temperature source Oral, resp. rate 19, height 5' 3" (1.6 m), weight 93.3 kg, last menstrual period 10/14/2020, SpO2 100 %. Patient Vitals for the past 24 hrs:  BP Temp Temp src Pulse Resp SpO2 Height Weight  04/28/21 2003 (!) 147/80 -- -- 41 -- -- -- --  04/28/21 2000 -- -- -- -- -- 100 % -- --  04/28/21 1955 -- -- -- -- -- 100 % -- --  04/28/21 1950 -- -- -- -- -- 100 % -- --  04/28/21 1946 139/82 -- -- 82 -- -- -- --  04/28/21 1945 -- -- -- -- -- 99 % -- --  04/28/21 1940 -- -- -- -- -- 100 % -- --  04/28/21 1935 -- -- -- -- -- 99 % -- --  04/28/21 1930 (!) 143/81 -- -- 79 -- 100 % -- --  04/28/21 1925 -- -- -- -- -- 100 % -- --  04/28/21 1920 -- -- -- -- -- 100 % -- --  04/28/21 1916 (!) 143/82 -- -- 75 -- -- -- --  04/28/21 1915 -- -- -- -- -- 100 % -- --  04/28/21 1910 -- -- -- -- -- 100 % -- --  04/28/21 1905 -- -- -- -- -- 100 % -- --  04/28/21 1901 (!) 148/80 -- -- 84 -- -- -- --  04/28/21 1900 -- -- -- -- -- 100 % -- --  04/28/21 1855 -- -- -- -- -- 100 % -- --  04/28/21 1850 -- -- -- -- -- 100 % -- --  04/28/21 1846 (!) 167/95 -- -- 77 -- -- -- --  04/28/21 1845 -- -- -- -- -- 100 % -- --  04/28/21 1840 -- -- -- -- -- 100 % -- --  04/28/21 1835 -- -- -- -- --  100 % -- --  04/28/21 1832 (!) 162/93 -- -- 22 -- -- -- --  04/28/21 1830 -- -- -- -- -- 100 % -- --  04/28/21 1825 -- -- -- -- -- (!) 85 % -- --  04/28/21 1823 (!) 167/101 -- -- 73 -- -- -- --  04/28/21 1820 (!) 175/103 -- -- 63 -- 100 % -- --  04/28/21 1818  (!) 157/84 -- -- 68 -- -- -- --  04/28/21 1816 (!) 155/93 -- -- 66 -- -- -- --  04/28/21 1815 -- -- -- -- -- 100 % -- --  04/28/21 1810 -- -- -- -- -- 100 % -- --  04/28/21 1805 -- -- -- -- -- 99 % -- --  04/28/21 1801 (!) 158/96 -- -- 74 -- -- -- --  04/28/21 1800 -- -- -- -- -- 100 % -- --  04/28/21 1755 -- -- -- -- -- 100 % -- --  04/28/21 1750 -- -- -- -- -- 100 % -- --  04/28/21 1746 (!) 167/90 -- -- 73 -- -- -- --  04/28/21 1745 -- -- -- -- -- 100 % -- --  04/28/21 1741 (!) 160/100 -- -- 72 -- -- -- --  04/28/21 1723 (!) 167/95 98.3 F (36.8 C) Oral 66 19 100 % -- --  04/28/21 1718 -- -- -- -- -- -- 5' 3" (1.6 m) 93.3 kg    Physical Exam Vitals and nursing note reviewed.  Constitutional:      General: She is not in acute distress.    Appearance: Normal appearance.  HENT:     Head: Normocephalic and atraumatic.  Eyes:     Extraocular Movements: Extraocular movements intact.     Pupils: Pupils are equal, round, and reactive to light.  Cardiovascular:     Rate and Rhythm: Normal rate.  Pulmonary:     Effort: Pulmonary effort is normal. No respiratory distress.  Musculoskeletal:        General: No swelling. Normal range of motion.     Cervical back: Normal range of motion.  Skin:    General: Skin is warm and dry.  Neurological:     General: No focal deficit present.     Mental Status: She is alert and oriented to person, place, and time.     Cranial Nerves: No cranial nerve deficit.     Motor: No weakness.     Deep Tendon Reflexes: Reflexes normal.  Psychiatric:        Mood and Affect: Mood normal.        Behavior: Behavior normal.  EFM: 160 bpm, mod variability, no accels, no decels Toco: UI  Results for orders placed or performed during the hospital encounter of 04/28/21 (from the past 24 hour(s))  Urinalysis, Routine w reflex microscopic Urine, Clean Catch     Status: Abnormal   Collection Time: 04/28/21  6:21 PM  Result Value Ref Range   Color, Urine YELLOW  YELLOW   APPearance HAZY (A) CLEAR   Specific Gravity, Urine 1.010 1.005 - 1.030   pH 7.0 5.0 - 8.0   Glucose, UA NEGATIVE NEGATIVE mg/dL   Hgb urine dipstick NEGATIVE NEGATIVE   Bilirubin Urine NEGATIVE NEGATIVE   Ketones, ur NEGATIVE NEGATIVE mg/dL   Protein, ur NEGATIVE NEGATIVE mg/dL   Nitrite NEGATIVE NEGATIVE   Leukocytes,Ua NEGATIVE NEGATIVE  Protein / creatinine ratio, urine     Status: None   Collection Time: 04/28/21  6:21 PM  Result Value Ref  Range   Creatinine, Urine 76.71 mg/dL   Total Protein, Urine 11 mg/dL   Protein Creatinine Ratio 0.14 0.00 - 0.15 mg/mg[Cre]  CBC     Status: Abnormal   Collection Time: 04/28/21  6:45 PM  Result Value Ref Range   WBC 6.9 4.0 - 10.5 K/uL   RBC 3.55 (L) 3.87 - 5.11 MIL/uL   Hemoglobin 11.9 (L) 12.0 - 15.0 g/dL   HCT 34.0 (L) 36.0 - 46.0 %   MCV 95.8 80.0 - 100.0 fL   MCH 33.5 26.0 - 34.0 pg   MCHC 35.0 30.0 - 36.0 g/dL   RDW 12.9 11.5 - 15.5 %   Platelets 304 150 - 400 K/uL   nRBC 0.0 0.0 - 0.2 %  Comprehensive metabolic panel     Status: Abnormal   Collection Time: 04/28/21  6:45 PM  Result Value Ref Range   Sodium 133 (L) 135 - 145 mmol/L   Potassium 4.0 3.5 - 5.1 mmol/L   Chloride 101 98 - 111 mmol/L   CO2 23 22 - 32 mmol/L   Glucose, Bld 105 (H) 70 - 99 mg/dL   BUN 5 (L) 6 - 20 mg/dL   Creatinine, Ser 0.67 0.44 - 1.00 mg/dL   Calcium 9.2 8.9 - 10.3 mg/dL   Total Protein 6.7 6.5 - 8.1 g/dL   Albumin 3.2 (L) 3.5 - 5.0 g/dL   AST 20 15 - 41 U/L   ALT 12 0 - 44 U/L   Alkaline Phosphatase 94 38 - 126 U/L   Total Bilirubin 0.5 0.3 - 1.2 mg/dL   GFR, Estimated >60 >60 mL/min   Anion gap 9 5 - 15   MAU Course  Procedures LR Labetalol 20 mg x1 Reglan Benadryl  MDM Labs ordered and reviewed. Normal labs. 1952: BP improved. Pt states HA and nausea better, rates 2/10, pt requesting food. Consult with Dr. Ilda Basset, recommend start Labetalol 300 bid and f/u next week.  2020: Tolerating po. Discussed plan with pt, start  first dose tomorrow am. Pt stays at [facility], states she does have transportation to pick up tomorrow. Stable for discharge.   Assessment and Plan   1. Supervision of high risk pregnancy, antepartum   2. Abnormal antenatal AFP screen   3. [redacted] weeks gestation of pregnancy   4. Chronic hypertension in pregnancy   5. Acute non intractable tension-type headache    Discharge home [facility] next week as scheduled PEC precautions Rx Labetalol  Allergies as of 04/28/2021       Reactions   Iodides Anaphylaxis, Itching   Morphine And Related Anaphylaxis, Itching   Shellfish Allergy Anaphylaxis, Itching   Pt reports "watery eyes"   Ultram [tramadol Hcl] Hives   Hives and swollen lips         Medication List     TAKE these medications    aspirin EC 81 MG tablet Take 1 tablet (81 mg total) by mouth daily. Swallow whole.   blood glucose meter kit and supplies Dispense based on patient and insurance preference. Use up to four times daily as directed. (FOR ICD-10 E10.9, E11.9).   Blood Pressure Monitoring Devi 1 each by Does not apply route once a week.   Dexcom G6 Sensor Misc 1 each every 10 days   Dexcom G6 Transmitter Misc 1 each by Does not apply route every 3 (three) months.   insulin aspart 100 UNIT/ML injection Commonly known as: novoLOG For use in Omnipod.  Current TDD 66 units. Patient to adjust per MD orders.   insulin glargine 100 UNIT/ML Solostar Pen Commonly known as: LANTUS Inject 16 Units into the skin 2 (two) times daily. 16 units with breakfast and 16 units just before bed   Insulin Pen Needle 31G X 5 MM Misc 1 Device by Does not apply route 4 (four) times daily. For use with insulin pens   labetalol 300 MG tablet Commonly known as: NORMODYNE Take 1 tablet (300 mg total) by mouth 2 (two) times daily.   ondansetron 4 MG disintegrating tablet Commonly known as: Zofran ODT Take 1 tablet (4 mg total) by mouth every 8 (eight) hours as needed  for nausea or vomiting.   Prenatal Vitamins 28-0.8 MG Tabs Take 1 tablet by mouth daily.   promethazine 25 MG tablet Commonly known as: PHENERGAN Take 1 tablet (25 mg total) by mouth every 6 (six) hours as needed for nausea or vomiting.        Julianne Handler, CNM 04/28/2021, 8:25 PM

## 2021-04-28 NOTE — MAU Note (Signed)
Presents with c/o dizziness, H/A and N/V.  Reports symptoms began today @ approx 1300.  Reports hasn't been able to keep anything down.  Denies VB or  LOF.  Endorses +FM.

## 2021-05-03 ENCOUNTER — Ambulatory Visit: Payer: 59 | Admitting: Registered"

## 2021-05-03 ENCOUNTER — Encounter: Payer: 59 | Attending: Obstetrics & Gynecology | Admitting: Registered"

## 2021-05-03 ENCOUNTER — Ambulatory Visit (INDEPENDENT_AMBULATORY_CARE_PROVIDER_SITE_OTHER): Payer: 59

## 2021-05-03 ENCOUNTER — Ambulatory Visit: Payer: Medicaid Other | Admitting: *Deleted

## 2021-05-03 ENCOUNTER — Ambulatory Visit: Payer: Medicaid Other | Attending: Maternal & Fetal Medicine

## 2021-05-03 ENCOUNTER — Other Ambulatory Visit: Payer: Self-pay

## 2021-05-03 ENCOUNTER — Encounter: Payer: Self-pay | Admitting: *Deleted

## 2021-05-03 VITALS — BP 132/89 | HR 69 | Wt 200.6 lb

## 2021-05-03 VITALS — BP 140/96 | HR 64

## 2021-05-03 DIAGNOSIS — O10012 Pre-existing essential hypertension complicating pregnancy, second trimester: Secondary | ICD-10-CM

## 2021-05-03 DIAGNOSIS — O0990 Supervision of high risk pregnancy, unspecified, unspecified trimester: Secondary | ICD-10-CM | POA: Diagnosis not present

## 2021-05-03 DIAGNOSIS — E119 Type 2 diabetes mellitus without complications: Secondary | ICD-10-CM

## 2021-05-03 DIAGNOSIS — O24112 Pre-existing diabetes mellitus, type 2, in pregnancy, second trimester: Secondary | ICD-10-CM | POA: Diagnosis not present

## 2021-05-03 DIAGNOSIS — O280 Abnormal hematological finding on antenatal screening of mother: Secondary | ICD-10-CM

## 2021-05-03 DIAGNOSIS — O10919 Unspecified pre-existing hypertension complicating pregnancy, unspecified trimester: Secondary | ICD-10-CM

## 2021-05-03 DIAGNOSIS — O10912 Unspecified pre-existing hypertension complicating pregnancy, second trimester: Secondary | ICD-10-CM | POA: Insufficient documentation

## 2021-05-03 DIAGNOSIS — O24119 Pre-existing diabetes mellitus, type 2, in pregnancy, unspecified trimester: Secondary | ICD-10-CM | POA: Diagnosis present

## 2021-05-03 DIAGNOSIS — O219 Vomiting of pregnancy, unspecified: Secondary | ICD-10-CM

## 2021-05-03 DIAGNOSIS — Z3A27 27 weeks gestation of pregnancy: Secondary | ICD-10-CM

## 2021-05-03 DIAGNOSIS — Z0130 Encounter for examination of blood pressure without abnormal findings: Secondary | ICD-10-CM

## 2021-05-03 MED ORDER — ONDANSETRON 4 MG PO TBDP: 4.0000 mg | ORAL_TABLET | Freq: Four times a day (QID) | ORAL | 0 refills | Status: DC | PRN

## 2021-05-03 MED ORDER — NIFEDIPINE ER OSMOTIC RELEASE 30 MG PO TB24: 30.0000 mg | ORAL_TABLET | Freq: Every day | ORAL | 0 refills | Status: DC

## 2021-05-03 NOTE — Progress Notes (Signed)
Here today for blood pressure check. Pt is 27w 1d with history of chronic hypertension. Pt most recently prescribed Labetalol 300 mg BID on 04/28/21. Patient reports nausea and vomiting each time she has taken this medication. Has tried taking with meal, no change. Last taken yesterday evening. BP today is 132/89, HR 69. Reviewed with Vergie Living, MD who gives verbal order for Procardia XL 30 mg daily. Pt notified of provider recommendation and need to follow up at routine prenatal appt this Friday. Pt requests nausea medication in case this medication also causes nausea. Current prescription of Zofran 4 mg reordered to new pharmacy. Letter placed in patient's chart for [facility] at patient's request; pt states they will need notification of change in medication.  Fleet Contras RN 05/03/21

## 2021-05-03 NOTE — Progress Notes (Signed)
Patient was seen on 05/03/21 for follow-up Type 2 Diabetes. EDD Aug 01, 2021. Start 0825 End [phone]  Medication: (patient reported) Lantus 18 units bid;  Novolog 8 units in the morning even when not eating breakfast.  Patient states she does not have blood glucose log. Pt reports: FBS: 92-93 mg/dL  Postprandial: not checking as directed, waits a few minutes after eating 120 mg/dL  Although patient has Dexcom Rx, patient prefers to uses glucometer.  Patient still interested in using Omnipod. Scheduled for training on Friday 05/05/2021 at 2 pm. Patient was instructed to bring kit including folder, pods, and insulin to training. Last time training was set up patient did not bring all supplies.  Patient states she would like to have a nutritional supplement for breakfast because most foods makes her nauseous. After reviewing her dietary recall it would be reasonable for her to have 1 high protein Ensure per day.   When Diane Day, RN started Rx a contraindication warning came up. Per Lolita Rieger, PharmD, "It looks like the Ensure is flagging for the potassium iodide ingredient. However, back in January of this year she tolerated Glucerna fine and that also contains potassium iodide. I would think she would be able to tolerate the Ensure okay."   Dietary 24 hr recall: B: not much of an appetite and most foods cause nausea. Yesterday cereal honey nut cheerios, milk sometimes ~4 oz orange juice OR crescent with a little butter L: pepperoni pizza, 1 slice and crust off another slice D: 2 c baked spaghetti, salad, garlic bread, banana pudding Bev: water, ice chews on ice  Pt was seen by nursing staff after diabetes visit due to intolerance (vomiting) of labetalol 300 mg, BP medication which she received at hospital discharge on Friday. Pt states she lost her cuff and cannot take BP at home.   The following learning objectives reviewed during follow-up visit: Importance of protein at breakfast When  to take Novolog Training for [facility] set for Sept 2 at 2 pm  Plan:  Record your blood sugar on the log sheet and take to all your appointments. Gather all your supplies for pump training and meet Irven Baltimore at [facility] for [facility] care at 2 pm  Patient instructed to monitor glucose levels: FBS: 70 - 95 mg/dl 2 hour: <120 mg/dl  Patient received the following handouts: Glucose log  Patient will be seen for follow-up in 2 weeks or as needed.

## 2021-05-04 ENCOUNTER — Other Ambulatory Visit: Payer: Self-pay | Admitting: *Deleted

## 2021-05-04 DIAGNOSIS — O24113 Pre-existing diabetes mellitus, type 2, in pregnancy, third trimester: Secondary | ICD-10-CM

## 2021-05-05 ENCOUNTER — Encounter: Payer: Self-pay | Admitting: Family Medicine

## 2021-05-05 ENCOUNTER — Ambulatory Visit (INDEPENDENT_AMBULATORY_CARE_PROVIDER_SITE_OTHER): Payer: 59 | Admitting: Family Medicine

## 2021-05-05 ENCOUNTER — Other Ambulatory Visit: Payer: Self-pay

## 2021-05-05 VITALS — BP 145/94 | HR 93 | Wt 201.5 lb

## 2021-05-05 DIAGNOSIS — O280 Abnormal hematological finding on antenatal screening of mother: Secondary | ICD-10-CM

## 2021-05-05 DIAGNOSIS — O0990 Supervision of high risk pregnancy, unspecified, unspecified trimester: Secondary | ICD-10-CM | POA: Diagnosis not present

## 2021-05-05 DIAGNOSIS — O24112 Pre-existing diabetes mellitus, type 2, in pregnancy, second trimester: Secondary | ICD-10-CM

## 2021-05-05 DIAGNOSIS — Z23 Encounter for immunization: Secondary | ICD-10-CM

## 2021-05-05 DIAGNOSIS — O10919 Unspecified pre-existing hypertension complicating pregnancy, unspecified trimester: Secondary | ICD-10-CM

## 2021-05-05 MED ORDER — ACETAMINOPHEN 500 MG PO TABS
500.0000 mg | ORAL_TABLET | Freq: Once | ORAL | Status: AC
  Administered 2021-05-05: 500 mg via ORAL

## 2021-05-05 NOTE — Addendum Note (Signed)
Addended by: Denyce Robert E on: 05/05/2021 10:23 AM   Modules accepted: Orders

## 2021-05-05 NOTE — Progress Notes (Signed)
   Subjective:  Misty [name] is a 30 y.o. G2P0010 at [redacted]w[redacted]d being seen today for ongoing prenatal care.  She is currently monitored for the following issues for this high-risk pregnancy and has Chronic hypertension affecting pregnancy; Supervision of high risk pregnancy, antepartum; Type 2 diabetes mellitus complicating pregnancy in second trimester, antepartum; and Abnormal antenatal AFP screen on their problem list.  Patient reports no complaints.  Contractions: Not present. Vag. Bleeding: None.  Movement: Present. Denies leaking of fluid.   The following portions of the patient's history were reviewed and updated as appropriate: allergies, current medications, past family history, past medical history, past social history, past surgical history and problem list. Problem list updated.  Objective:   Vitals:   05/05/21 0936  BP: (!) 145/94  Pulse: 93  Weight: 201 lb 8 oz (91.4 kg)    Fetal Status: Fetal Heart Rate (bpm): 154   Movement: Present     General:  Alert, oriented and cooperative. Patient is in no acute distress.  Skin: Skin is warm and dry. No rash noted.   Cardiovascular: Normal heart rate noted  Respiratory: Normal respiratory effort, no problems with respiration noted  Abdomen: Soft, gravid, appropriate for gestational age. Pain/Pressure: Absent     Pelvic: Vag. Bleeding: None     Cervical exam deferred        Extremities: Normal range of motion.  Edema: None  Mental Status: Normal mood and affect. Normal behavior. Normal judgment and thought content.   Urinalysis:      Assessment and Plan:  Pregnancy: G2P0010 at [redacted]w[redacted]d  1. Supervision of high risk pregnancy, antepartum BP mildly elevated, see below FHR normal Discussed contraception, reviewed LARCs at length, she will consider Accepts TDaP and flu shot today  2. Type 2 diabetes mellitus complicating pregnancy in second trimester, antepartum Regimen increased to NPH 20u BID and novolog 15u TID last visit Has  been taking NPH 18u BID and Novolog 8u TID Sugar log not brought today On recall fastings are elevated, around 120. Post prandials are in the 90's Meeting with [name] later today to get started on omnipod and dexcom, will reassess sugars after this Following w MFM, last growth [place] 05/03/21 with normal EFW (44%) and AFI Start antenatal testing at 32 weeks  3. Chronic hypertension affecting pregnancy Switched to Nifedipine 30 XL last visit due to n/v with labetalol BP today is mildly elevated but reports has not yet started taking nifedipine, no dose adjustment today, to start taking it as soon as she picks up the prescription Following w MFM, start antenatal testing at 32 weeks  4. Abnormal antenatal AFP screen S/p counseling by MFM, declined amnio, [place] normal without any identifiable cause  Preterm labor symptoms and general obstetric precautions including but not limited to vaginal bleeding, contractions, leaking of fluid and fetal movement were reviewed in detail with the patient. Please refer to After Visit Summary for other counseling recommendations.  Return in 2 weeks (on 05/19/2021) for [facility], ob visit, needs MD.   Venora Maples, MD

## 2021-05-05 NOTE — Patient Instructions (Signed)

## 2021-05-06 LAB — HIV ANTIBODY (ROUTINE TESTING W REFLEX): HIV Screen 4th Generation wRfx: NONREACTIVE

## 2021-05-06 LAB — RPR: RPR Ser Ql: NONREACTIVE

## 2021-05-09 NOTE — Progress Notes (Signed)
Patient was assessed and managed by nursing staff during this encounter. I have reviewed the chart and agree with the documentation and plan. I have also made any necessary editorial changes.  Woodlynne Bing, MD 05/09/2021 9:57 AM

## 2021-05-24 ENCOUNTER — Encounter (HOSPITAL_COMMUNITY): Payer: Self-pay | Admitting: Obstetrics and Gynecology

## 2021-05-24 ENCOUNTER — Inpatient Hospital Stay (HOSPITAL_COMMUNITY)
Admission: AD | Admit: 2021-05-24 | Discharge: 2021-05-24 | Disposition: A | Discharge status: Home / Self Care | Payer: 59 | Attending: Obstetrics and Gynecology | Admitting: Obstetrics and Gynecology

## 2021-05-24 DIAGNOSIS — O119 Pre-existing hypertension with pre-eclampsia, unspecified trimester: Secondary | ICD-10-CM

## 2021-05-24 DIAGNOSIS — O113 Pre-existing hypertension with pre-eclampsia, third trimester: Secondary | ICD-10-CM

## 2021-05-24 DIAGNOSIS — Z3A30 30 weeks gestation of pregnancy: Secondary | ICD-10-CM | POA: Insufficient documentation

## 2021-05-24 DIAGNOSIS — O10013 Pre-existing essential hypertension complicating pregnancy, third trimester: Secondary | ICD-10-CM | POA: Insufficient documentation

## 2021-05-24 DIAGNOSIS — O280 Abnormal hematological finding on antenatal screening of mother: Secondary | ICD-10-CM

## 2021-05-24 DIAGNOSIS — O212 Late vomiting of pregnancy: Secondary | ICD-10-CM | POA: Diagnosis not present

## 2021-05-24 DIAGNOSIS — O99323 Drug use complicating pregnancy, third trimester: Secondary | ICD-10-CM | POA: Insufficient documentation

## 2021-05-24 DIAGNOSIS — O10913 Unspecified pre-existing hypertension complicating pregnancy, third trimester: Secondary | ICD-10-CM

## 2021-05-24 DIAGNOSIS — R112 Nausea with vomiting, unspecified: Secondary | ICD-10-CM | POA: Diagnosis not present

## 2021-05-24 DIAGNOSIS — Z87891 Personal history of nicotine dependence: Secondary | ICD-10-CM | POA: Diagnosis not present

## 2021-05-24 DIAGNOSIS — O219 Vomiting of pregnancy, unspecified: Secondary | ICD-10-CM

## 2021-05-24 DIAGNOSIS — E119 Type 2 diabetes mellitus without complications: Secondary | ICD-10-CM | POA: Insufficient documentation

## 2021-05-24 DIAGNOSIS — Z794 Long term (current) use of insulin: Secondary | ICD-10-CM | POA: Insufficient documentation

## 2021-05-24 DIAGNOSIS — O24113 Pre-existing diabetes mellitus, type 2, in pregnancy, third trimester: Secondary | ICD-10-CM | POA: Insufficient documentation

## 2021-05-24 DIAGNOSIS — F1290 Cannabis use, unspecified, uncomplicated: Secondary | ICD-10-CM | POA: Diagnosis not present

## 2021-05-24 DIAGNOSIS — O0990 Supervision of high risk pregnancy, unspecified, unspecified trimester: Secondary | ICD-10-CM

## 2021-05-24 DIAGNOSIS — O99891 Other specified diseases and conditions complicating pregnancy: Secondary | ICD-10-CM | POA: Diagnosis not present

## 2021-05-24 DIAGNOSIS — Z3689 Encounter for other specified antenatal screening: Secondary | ICD-10-CM

## 2021-05-24 HISTORY — DX: Pre-existing hypertension with pre-eclampsia, unspecified trimester: O11.9

## 2021-05-24 LAB — URINALYSIS, MICROSCOPIC (REFLEX): Bacteria, UA: NONE SEEN

## 2021-05-24 LAB — COMPREHENSIVE METABOLIC PANEL
ALT: 13 U/L (ref 0–44)
AST: 22 U/L (ref 15–41)
Albumin: 3 g/dL — ABNORMAL LOW (ref 3.5–5.0)
Alkaline Phosphatase: 158 U/L — ABNORMAL HIGH (ref 38–126)
Anion gap: 12 (ref 5–15)
BUN: <5 mg/dL — ABNORMAL LOW (ref 6–20)
CO2: 18 mmol/L — ABNORMAL LOW (ref 22–32)
Calcium: 9.1 mg/dL (ref 8.9–10.3)
Chloride: 105 mmol/L (ref 98–111)
Creatinine, Ser: 0.7 mg/dL (ref 0.44–1.00)
GFR, Estimated: >60 mL/min (ref >60)
Glucose, Bld: 94 mg/dL (ref 70–99)
Potassium: 3.6 mmol/L (ref 3.5–5.1)
Sodium: 135 mmol/L (ref 135–145)
Total Bilirubin: 0.7 mg/dL (ref 0.3–1.2)
Total Protein: 6.6 g/dL (ref 6.5–8.1)

## 2021-05-24 LAB — URINALYSIS, ROUTINE W REFLEX MICROSCOPIC
Glucose, UA: NEGATIVE mg/dL
Hgb urine dipstick: NEGATIVE
Ketones, ur: >80 mg/dL — AB
Leukocytes,Ua: NEGATIVE
Nitrite: NEGATIVE
Protein, ur: >300 mg/dL — AB
Specific Gravity, Urine: >1.03 — ABNORMAL HIGH (ref 1.005–1.030)
pH: 6 (ref 5.0–8.0)

## 2021-05-24 LAB — RAPID URINE DRUG SCREEN, HOSP PERFORMED
Amphetamines: NOT DETECTED
Barbiturates: NOT DETECTED
Benzodiazepines: NOT DETECTED
Cocaine: NOT DETECTED
Opiates: NOT DETECTED
Tetrahydrocannabinol: POSITIVE — AB

## 2021-05-24 LAB — PROTEIN / CREATININE RATIO, URINE
Creatinine, Urine: 386.76 mg/dL
Protein Creatinine Ratio: 1.11 mg/mg{Cre} — ABNORMAL HIGH (ref 0.00–0.15)
Total Protein, Urine: 430 mg/dL

## 2021-05-24 LAB — CBC
HCT: 33.3 % — ABNORMAL LOW (ref 36.0–46.0)
Hemoglobin: 11.9 g/dL — ABNORMAL LOW (ref 12.0–15.0)
MCH: 33.7 pg (ref 26.0–34.0)
MCHC: 35.7 g/dL (ref 30.0–36.0)
MCV: 94.3 fL (ref 80.0–100.0)
Platelets: 277 10*3/uL (ref 150–400)
RBC: 3.53 MIL/uL — ABNORMAL LOW (ref 3.87–5.11)
RDW: 12.3 % (ref 11.5–15.5)
WBC: 5.1 10*3/uL (ref 4.0–10.5)
nRBC: 0 % (ref 0.0–0.2)

## 2021-05-24 LAB — GLUCOSE, CAPILLARY: Glucose-Capillary: 97 mg/dL (ref 70–99)

## 2021-05-24 MED ORDER — ONDANSETRON HCL 4 MG/2ML IJ SOLN
4.0000 mg | Freq: Once | INTRAMUSCULAR | Status: AC
  Administered 2021-05-24: 4 mg via INTRAVENOUS
  Filled 2021-05-24: qty 2

## 2021-05-24 MED ORDER — LABETALOL HCL 5 MG/ML IV SOLN
40.0000 mg | INTRAVENOUS | Status: DC | PRN
  Administered 2021-05-24: 40 mg via INTRAVENOUS
  Filled 2021-05-24: qty 8

## 2021-05-24 MED ORDER — LABETALOL HCL 5 MG/ML IV SOLN: 80.0000 mg | INTRAVENOUS | Status: DC | PRN

## 2021-05-24 MED ORDER — HYDRALAZINE HCL 20 MG/ML IJ SOLN: 10.0000 mg | INTRAMUSCULAR | Status: DC | PRN

## 2021-05-24 MED ORDER — LACTATED RINGERS IV BOLUS
1000.0000 mL | Freq: Once | INTRAVENOUS | Status: AC
  Administered 2021-05-24: 1000 mL via INTRAVENOUS

## 2021-05-24 MED ORDER — SCOPOLAMINE 1 MG/3DAYS TD PT72: 1.0000 | MEDICATED_PATCH | TRANSDERMAL | 0 refills | Status: DC

## 2021-05-24 MED ORDER — NIFEDIPINE ER OSMOTIC RELEASE 30 MG PO TB24
30.0000 mg | ORAL_TABLET | Freq: Once | ORAL | Status: AC
  Administered 2021-05-24: 30 mg via ORAL
  Filled 2021-05-24: qty 1

## 2021-05-24 MED ORDER — PROMETHAZINE HCL 25 MG PO TABS: 25.0000 mg | ORAL_TABLET | Freq: Four times a day (QID) | ORAL | 3 refills | Status: DC | PRN

## 2021-05-24 MED ORDER — ONDANSETRON 8 MG PO TBDP: 8.0000 mg | ORAL_TABLET | Freq: Three times a day (TID) | ORAL | 0 refills | Status: DC | PRN

## 2021-05-24 MED ORDER — FAMOTIDINE 10 MG PO TABS: 10.0000 mg | ORAL_TABLET | Freq: Two times a day (BID) | ORAL | 0 refills | Status: DC

## 2021-05-24 MED ORDER — LABETALOL HCL 5 MG/ML IV SOLN
INTRAVENOUS | Status: AC
  Administered 2021-05-24: 20 mg via INTRAVENOUS
  Filled 2021-05-24: qty 4

## 2021-05-24 MED ORDER — LABETALOL HCL 5 MG/ML IV SOLN: 20.0000 mg | INTRAVENOUS | Status: DC | PRN

## 2021-05-24 NOTE — MAU Note (Addendum)
Can't keep nothing down, everything comes up. Takes her nausea pill, that comes back up.  This has been going on since Monday.  Tried to eat chicken alfredo last night.... came back up.  (Does not have and did not ask for emesis bag)

## 2021-05-24 NOTE — MAU Provider Note (Signed)
History     CSN: [id number]  Arrival date and time: 05/24/21 1403   Event Date/Time   First Provider Initiated Contact with Patient 05/24/21 1440      Chief Complaint  Patient presents with   Emesis   Nausea   HPI Misty [name] is a 30 y.o. G2P0010 at [redacted]w[redacted]d who presents to MAU with chief complaint of nausea and vomiting for the past three days. In that time she has been unable to tolerate anything PO. She has a prescription for Zofran ODT which was recently refilled but she has not taken it recently. She denies weakness, dizziness, syncope.  CHTN Patient's pregnancy is complicated by Chronic Hypertension. Patient is aware that she takes a blood pressure medicine daily but cannot remember the name. She took it this morning but vomited shortly after. She denies headache, visual disturbances, RUQ/epigastric pain, new onset swelling or weight gain.  T2DM Patient reports being compliant with her Lantus and Novolog regimen. She reports occasional elevated fasting CBGs but states her overall control is good. She has not started using her pod. She denies weakness, syncope, palpitations.   Patient receives care with [facility].   OB History     Gravida  2   Para  0   Term  0   Preterm  0   AB  1   Living  0      SAB  1   IAB  0   Ectopic  0   Multiple  0   Live Births              Past Medical History:  Diagnosis Date   Abscess 09/02/2020   Allergy    Anemia 2015   Asthma    as child   Diabetes mellitus without complication ([facility]) 3704   DKA (diabetic ketoacidosis) ([facility]) 09/02/2020   Eczema    Hypertension 09/2013    Past Surgical History:  Procedure Laterality Date   Abcess on back      Family History  Problem Relation Age of Onset   Hypertension Mother    Heart disease Mother 40       CHF   Diabetes Mother    Sickle cell anemia Brother    Hypertension Maternal Grandfather    Diabetes Maternal Grandfather    Heart disease Maternal  Grandfather    Healthy Father    Diabetes Maternal Grandmother    Heart disease Maternal Grandmother     Social History   Tobacco Use   Smoking status: Former    Packs/day: 0.50    Years: 2.00    Pack years: 1.00    Types: Cigarettes    Quit date: 09/08/2013    Years since quitting: 7.7   Smokeless tobacco: Never  Vaping Use   Vaping Use: Never used  Substance Use Topics   Alcohol use: No    Alcohol/week: 0.0 standard drinks   Drug use: No    Allergies:  Allergies  Allergen Reactions   Iodides Anaphylaxis and Itching   Morphine And Related Anaphylaxis and Itching   Shellfish Allergy Anaphylaxis and Itching    Pt reports "watery eyes"   Ultram [Tramadol Hcl] Hives    Hives and swollen lips     Medications Prior to Admission  Medication Sig Dispense Refill Last Dose   aspirin EC 81 MG tablet Take 1 tablet (81 mg total) by mouth daily. Swallow whole. 90 tablet 3 05/24/2021   ACCU-CHEK GUIDE test strip  Accu-Chek Softclix Lancets lancets SMARTSIG:2 Topical Twice Daily      blood glucose meter kit and supplies Dispense based on patient and insurance preference. Use up to four times daily as directed. (FOR ICD-10 E10.9, E11.9). 1 each 0    Blood Pressure Monitoring DEVI 1 each by Does not apply route once a week. 1 each 0    Continuous Blood Gluc Sensor (DEXCOM G6 SENSOR) MISC 1 each every 10 days 3 each 6    Continuous Blood Gluc Transmit (DEXCOM G6 TRANSMITTER) MISC 1 each by Does not apply route every 3 (three) months. 1 each 3    insulin aspart (NOVOLOG) 100 UNIT/ML injection For use in [facility]mn[facility].  Current TDD 66 units. Patient to adjust per MD orders. (Patient taking differently: For use in [facility].  Current TDD 66 units. Patient to adjust per MD orders.) 30 mL 12    insulin glargine (LANTUS) 100 UNIT/ML Solostar Pen Inject 16 Units into the skin 2 (two) times daily. 16 units with breakfast and 16 units just before bed 15 mL 11    Insulin Pen Needle 31G X 5 MM MISC 1  Device by Does not apply route 4 (four) times daily. For use with insulin pens 100 each 11    LEVEMIR FLEXTOUCH 100 UNIT/ML FlexTouch Pen       NIFEdipine (PROCARDIA XL) 30 MG 24 hr tablet Take 1 tablet (30 mg total) by mouth daily. 30 tablet 0    ondansetron (ZOFRAN ODT) 4 MG disintegrating tablet Take 1 tablet (4 mg total) by mouth every 6 (six) hours as needed for nausea or vomiting. 20 tablet 0    Prenatal Vit-Fe Fumarate-FA (PRENATAL VITAMINS) 28-0.8 MG TABS Take 1 tablet by mouth daily. 30 tablet 11    promethazine (PHENERGAN) 25 MG tablet Take 1 tablet (25 mg total) by mouth every 6 (six) hours as needed for nausea or vomiting. 30 tablet 0     Review of Systems  Eyes:  Negative for photophobia and visual disturbance.  Cardiovascular:  Negative for palpitations and leg swelling.  Endocrine: Negative for polydipsia, polyphagia and polyuria.  Neurological:  Negative for headaches.  All other systems reviewed and are negative. Physical Exam   Blood pressure (!) 160/103, pulse 85, temperature 99 F (37.2 C), temperature source Oral, resp. rate 20, height $RemoveBe'5\' 3"'aijHcSNta$  (1.6 m), weight 93.3 kg, last menstrual period 10/14/2020, SpO2 99 %.  Physical Exam Vitals and nursing note reviewed. Exam conducted with a chaperone present.  Constitutional:      Appearance: Normal appearance. She is obese.  Cardiovascular:     Rate and Rhythm: Normal rate and regular rhythm.     Pulses: Normal pulses.     Heart sounds: Normal heart sounds.  Pulmonary:     Effort: Pulmonary effort is normal.     Breath sounds: Normal breath sounds.  Abdominal:     Comments: Gravid  Neurological:     Mental Status: She is alert and oriented to person, place, and time.  Psychiatric:        Mood and Affect: Mood normal.        Behavior: Behavior normal.        Thought Content: Thought content normal.        Judgment: Judgment normal.    MAU Course/MDM  Procedures  --Patient met on arrival due to severe range blood  pressure. Labetalol Protocol ordered --Patient prescribed Procardia 30 XL, vomited after taking this morning. On arrival to MAU states she is  too nauseated to take her Procardia. Requests trial of crackers and ice chips before taking medication. --1645: CNM returned to bedside to discuss importance of Procardia 30 XL. Pt agreeable to taking Procardia if she receives additional Zofran IV. Order placed --HPI, ROS, response to interventions discussed with Dr. Elonda Husky. No additional intervention indicated at this time. Given concern for sporadic compliance with Procardia 30 XL, will defer dose adjustment until patient's appointment on 09/23  Patient Vitals for the past 24 hrs:  BP Temp Temp src Pulse Resp SpO2 Height Weight  05/24/21 1941 (!) 152/88 -- -- 81 18 -- -- --  05/24/21 1830 (!) 149/96 -- -- 88 -- 100 % -- --  05/24/21 1816 (!) 133/92 -- -- 97 -- -- -- --  05/24/21 1806 (!) 149/100 -- -- 92 -- -- -- --  05/24/21 1746 (!) 149/73 -- -- 85 -- -- -- --  05/24/21 1715 130/81 -- -- 99 -- -- -- --  05/24/21 1701 130/81 -- -- 96 -- -- -- --  05/24/21 1646 (!) 160/103 -- -- 85 -- -- -- --  05/24/21 1631 (!) 149/95 -- -- (!) 106 -- -- -- --  05/24/21 1615 (!) 153/88 -- -- 92 -- -- -- --  05/24/21 1600 (!) 151/86 -- -- 93 -- 99 % -- --  05/24/21 1546 (!) 157/85 -- -- 85 -- -- -- --  05/24/21 1531 (!) 153/84 -- -- 76 -- -- -- --  05/24/21 1515 (!) 152/81 -- -- 79 -- 100 % -- --  05/24/21 1501 (!) 156/88 -- -- 86 -- -- -- --  05/24/21 1445 138/89 -- -- (!) 112 -- 100 % -- --  05/24/21 1435 (!) 162/101 99 F (37.2 C) Oral (!) 115 20 98 % -- --  05/24/21 1420 (!) 176/99 98.5 F (36.9 C) Oral 74 18 100 % $Rem'5\' 3"'pnVU$  (1.6 m) 93.3 kg   Meds ordered this encounter  Medications   AND Linked Order Group    labetalol (NORMODYNE) injection 20 mg    labetalol (NORMODYNE) injection 40 mg    labetalol (NORMODYNE) injection 80 mg    hydrALAZINE (APRESOLINE) injection 10 mg   labetalol (NORMODYNE) 5 MG/ML  injection    Druebbisch, Elmyra Ricks  : cabinet override   ondansetron (ZOFRAN) injection 4 mg   NIFEdipine (PROCARDIA-XL/NIFEDICAL-XL) 24 hr tablet 30 mg   ondansetron (ZOFRAN) injection 4 mg   lactated ringers bolus 1,000 mL   promethazine (PHENERGAN) 25 MG tablet    Sig: Take 1 tablet (25 mg total) by mouth every 6 (six) hours as needed for nausea or vomiting.    Dispense:  30 tablet    Refill:  3    Order Specific Question:   Supervising Provider    Answer:   Elonda Husky, LUTHER H [[id number]]   ondansetron (ZOFRAN ODT) 8 MG disintegrating tablet    Sig: Take 1 tablet (8 mg total) by mouth every 8 (eight) hours as needed for nausea or vomiting.    Dispense:  20 tablet    Refill:  0    Order Specific Question:   Supervising Provider    Answer:   Elonda Husky, LUTHER H [[id number]]   famotidine (PEPCID) 10 MG tablet    Sig: Take 1 tablet (10 mg total) by mouth 2 (two) times daily.    Dispense:  60 tablet    Refill:  0    Order Specific Question:   Supervising Provider    Answer:   Tania Ade H [[id number]]  scopolamine (TRANSDERM-SCOP, 1.5 MG,) 1 MG/3DAYS    Sig: Place 1 patch (1.5 mg total) onto the skin every 3 (three) days.    Dispense:  10 patch    Refill:  0    Order Specific Question:   Supervising Provider    Answer:   Florian Buff [[id number]]   Assessment and Plan  --30 y.o. G2P0010 at [redacted]w[redacted]d  --Reactive tracing --CHTN, on Procardia 30XL, not taken for the past three days --T2DM, on insulin, compliant per patient report --Chronic HTN with SIP --THC +, likely source of recurrent N/V, abstinence advised --Discussed with Dr. Elonda Husky, no additional interventions indicated at this time --Discharge home in stable condition with PEC precautions  F/U: --Patient scheduled with Dr. Dione Plover 09/23 at [place], [place]

## 2021-05-24 NOTE — MAU Note (Signed)
Received 30y/o female, G2p0 301/7, pt c/o N/V x 3 days, unable to retain solid food, liquids and medications, denies any headache, dizziness, blurry vision or right upper quad pain, + fetal movement, - LOF, safety maintained.

## 2021-05-24 NOTE — MAU Note (Signed)
Took her BP medication, threw it up too

## 2021-05-26 ENCOUNTER — Inpatient Hospital Stay (EMERGENCY_DEPARTMENT_HOSPITAL)
Admission: AD | Admit: 2021-05-26 | Discharge: 2021-05-26 | Disposition: A | Discharge status: Home / Self Care | Payer: 59 | Source: Home / Self Care | Attending: Obstetrics and Gynecology | Admitting: Obstetrics and Gynecology

## 2021-05-26 ENCOUNTER — Other Ambulatory Visit: Payer: Self-pay

## 2021-05-26 ENCOUNTER — Encounter (HOSPITAL_COMMUNITY): Payer: Self-pay | Admitting: Obstetrics and Gynecology

## 2021-05-26 ENCOUNTER — Ambulatory Visit (INDEPENDENT_AMBULATORY_CARE_PROVIDER_SITE_OTHER): Payer: 59 | Admitting: Family Medicine

## 2021-05-26 ENCOUNTER — Encounter: Payer: Self-pay | Admitting: Family Medicine

## 2021-05-26 VITALS — BP 179/110 | HR 67 | Wt 209.0 lb

## 2021-05-26 DIAGNOSIS — Z3A30 30 weeks gestation of pregnancy: Secondary | ICD-10-CM

## 2021-05-26 DIAGNOSIS — O10919 Unspecified pre-existing hypertension complicating pregnancy, unspecified trimester: Secondary | ICD-10-CM

## 2021-05-26 DIAGNOSIS — Z7982 Long term (current) use of aspirin: Secondary | ICD-10-CM | POA: Insufficient documentation

## 2021-05-26 DIAGNOSIS — Z79899 Other long term (current) drug therapy: Secondary | ICD-10-CM | POA: Insufficient documentation

## 2021-05-26 DIAGNOSIS — O119 Pre-existing hypertension with pre-eclampsia, unspecified trimester: Secondary | ICD-10-CM

## 2021-05-26 DIAGNOSIS — O280 Abnormal hematological finding on antenatal screening of mother: Secondary | ICD-10-CM

## 2021-05-26 DIAGNOSIS — Z87891 Personal history of nicotine dependence: Secondary | ICD-10-CM | POA: Insufficient documentation

## 2021-05-26 DIAGNOSIS — O10913 Unspecified pre-existing hypertension complicating pregnancy, third trimester: Secondary | ICD-10-CM | POA: Diagnosis not present

## 2021-05-26 DIAGNOSIS — O0990 Supervision of high risk pregnancy, unspecified, unspecified trimester: Secondary | ICD-10-CM

## 2021-05-26 DIAGNOSIS — O113 Pre-existing hypertension with pre-eclampsia, third trimester: Secondary | ICD-10-CM | POA: Insufficient documentation

## 2021-05-26 DIAGNOSIS — O212 Late vomiting of pregnancy: Secondary | ICD-10-CM | POA: Diagnosis not present

## 2021-05-26 DIAGNOSIS — O24112 Pre-existing diabetes mellitus, type 2, in pregnancy, second trimester: Secondary | ICD-10-CM

## 2021-05-26 LAB — COMPREHENSIVE METABOLIC PANEL
ALT: 14 U/L (ref 0–44)
AST: 23 U/L (ref 15–41)
Albumin: 2.7 g/dL — ABNORMAL LOW (ref 3.5–5.0)
Alkaline Phosphatase: 151 U/L — ABNORMAL HIGH (ref 38–126)
Anion gap: 8 (ref 5–15)
BUN: <5 mg/dL — ABNORMAL LOW (ref 6–20)
CO2: 23 mmol/L (ref 22–32)
Calcium: 8.9 mg/dL (ref 8.9–10.3)
Chloride: 103 mmol/L (ref 98–111)
Creatinine, Ser: 0.75 mg/dL (ref 0.44–1.00)
GFR, Estimated: >60 mL/min (ref >60)
Glucose, Bld: 110 mg/dL — ABNORMAL HIGH (ref 70–99)
Potassium: 3.7 mmol/L (ref 3.5–5.1)
Sodium: 134 mmol/L — ABNORMAL LOW (ref 135–145)
Total Bilirubin: 0.6 mg/dL (ref 0.3–1.2)
Total Protein: 6.2 g/dL — ABNORMAL LOW (ref 6.5–8.1)

## 2021-05-26 LAB — CBC
HCT: 32.7 % — ABNORMAL LOW (ref 36.0–46.0)
Hemoglobin: 11.3 g/dL — ABNORMAL LOW (ref 12.0–15.0)
MCH: 33.4 pg (ref 26.0–34.0)
MCHC: 34.6 g/dL (ref 30.0–36.0)
MCV: 96.7 fL (ref 80.0–100.0)
Platelets: 265 10*3/uL (ref 150–400)
RBC: 3.38 MIL/uL — ABNORMAL LOW (ref 3.87–5.11)
RDW: 12.6 % (ref 11.5–15.5)
WBC: 4.6 10*3/uL (ref 4.0–10.5)
nRBC: 0 % (ref 0.0–0.2)

## 2021-05-26 LAB — PROTEIN / CREATININE RATIO, URINE
Creatinine, Urine: 169.66 mg/dL
Protein Creatinine Ratio: 1.43 mg/mg{Cre} — ABNORMAL HIGH (ref 0.00–0.15)
Total Protein, Urine: 242 mg/dL

## 2021-05-26 LAB — URINALYSIS, ROUTINE W REFLEX MICROSCOPIC
Bacteria, UA: NONE SEEN
Bilirubin Urine: NEGATIVE
Glucose, UA: NEGATIVE mg/dL
Hgb urine dipstick: NEGATIVE
Ketones, ur: NEGATIVE mg/dL
Nitrite: NEGATIVE
Protein, ur: 100 mg/dL — AB
Specific Gravity, Urine: 1.015 (ref 1.005–1.030)
pH: 5 (ref 5.0–8.0)

## 2021-05-26 MED ORDER — NIFEDIPINE ER OSMOTIC RELEASE 60 MG PO TB24: 60.0000 mg | ORAL_TABLET | Freq: Every day | ORAL | 2 refills | Status: DC

## 2021-05-26 MED ORDER — ONDANSETRON 4 MG PO TBDP
8.0000 mg | ORAL_TABLET | Freq: Once | ORAL | Status: AC
  Administered 2021-05-26: 8 mg via ORAL
  Filled 2021-05-26: qty 2

## 2021-05-26 MED ORDER — HYDRALAZINE HCL 20 MG/ML IJ SOLN
5.0000 mg | INTRAMUSCULAR | Status: DC | PRN
  Administered 2021-05-26: 5 mg via INTRAVENOUS
  Filled 2021-05-26: qty 1

## 2021-05-26 MED ORDER — HYDRALAZINE HCL 20 MG/ML IJ SOLN
10.0000 mg | INTRAMUSCULAR | Status: DC | PRN
  Administered 2021-05-26: 10 mg via INTRAVENOUS

## 2021-05-26 MED ORDER — LABETALOL HCL 5 MG/ML IV SOLN: 40.0000 mg | INTRAVENOUS | Status: DC | PRN

## 2021-05-26 MED ORDER — LABETALOL HCL 5 MG/ML IV SOLN
20.0000 mg | INTRAVENOUS | Status: DC | PRN
Start: 2021-05-26 — End: 2021-05-26
  Administered 2021-05-26: 20 mg via INTRAVENOUS
  Filled 2021-05-26: qty 4

## 2021-05-26 NOTE — MAU Note (Signed)
.  Misty [name] is a 30 y.o. at [redacted]w[redacted]d here in MAU reporting: sent from office for severe range blood pressure. Denies HA, blurry vision, or swelling. Endorses good fetal movement. Denies VB or LOF.

## 2021-05-26 NOTE — Discharge Instructions (Signed)

## 2021-05-26 NOTE — Progress Notes (Signed)
BP elevated, but pt denies headaches, swelling or visual changes at this time.

## 2021-05-26 NOTE — Patient Instructions (Signed)

## 2021-05-26 NOTE — MAU Provider Note (Signed)
History     CSN: [id number]  Arrival date and time: 05/26/21 1027   Event Date/Time   First Provider Initiated Contact with Patient 05/26/21 1055      Chief Complaint  Patient presents with   Hypertension   HPI Misty [name] is a 30 y.o. G2P0010 at [redacted]w[redacted]d who presents from the office for evaluation of elevated blood pressures. She was seen for her routine prenatal and found to have severe range blood pressures. She reports taking her Procardia as prescribed this morning. She denies any HA, visual changes or epigastric pain. She reports normal fetal movement and no pregnancy complaints.  OB History     Gravida  2   Para  0   Term  0   Preterm  0   AB  1   Living  0      SAB  1   IAB  0   Ectopic  0   Multiple  0   Live Births              Past Medical History:  Diagnosis Date   Abscess 09/02/2020   Allergy    Anemia 2015   Asthma    as child   Diabetes mellitus without complication ([facility]) 2015   DKA (diabetic ketoacidosis) ([facility]) 09/02/2020   Eczema    Hypertension 09/2013    Past Surgical History:  Procedure Laterality Date   Abcess on back      Family History  Problem Relation Age of Onset   Hypertension Mother    Heart disease Mother 40       CHF   Diabetes Mother    Sickle cell anemia Brother    Hypertension Maternal Grandfather    Diabetes Maternal Grandfather    Heart disease Maternal Grandfather    Healthy Father    Diabetes Maternal Grandmother    Heart disease Maternal Grandmother     Social History   Tobacco Use   Smoking status: Former    Packs/day: 0.50    Years: 2.00    Pack years: 1.00    Types: Cigarettes    Quit date: 09/08/2013    Years since quitting: 7.7   Smokeless tobacco: Never  Vaping Use   Vaping Use: Never used  Substance Use Topics   Alcohol use: No    Alcohol/week: 0.0 standard drinks   Drug use: No    Allergies:  Allergies  Allergen Reactions   Iodides Anaphylaxis and Itching   Morphine  And Related Anaphylaxis and Itching   Shellfish Allergy Anaphylaxis and Itching    Pt reports "watery eyes"   Ultram [Tramadol Hcl] Hives    Hives and swollen lips     Medications Prior to Admission  Medication Sig Dispense Refill Last Dose   aspirin EC 81 MG tablet Take 1 tablet (81 mg total) by mouth daily. Swallow whole. 90 tablet 3 05/26/2021   blood glucose meter kit and supplies Dispense based on patient and insurance preference. Use up to four times daily as directed. (FOR ICD-10 E10.9, E11.9). 1 each 0 05/26/2021   Blood Pressure Monitoring DEVI 1 each by Does not apply route once a week. 1 each 0 05/26/2021   famotidine (PEPCID) 10 MG tablet Take 1 tablet (10 mg total) by mouth 2 (two) times daily. 60 tablet 0 05/26/2021   insulin aspart (NOVOLOG) 100 UNIT/ML injection For use in Omnipod.  Current TDD 66 units. Patient to adjust per MD orders. (Patient taking differently: For use   in [place].  Current TDD 66 units. Patient to adjust per MD orders.) 30 mL 12 05/26/2021   insulin glargine (LANTUS) 100 UNIT/ML Solostar Pen Inject 16 Units into the skin 2 (two) times daily. 16 units with breakfast and 16 units just before bed 15 mL 11 05/26/2021   ondansetron (ZOFRAN ODT) 8 MG disintegrating tablet Take 1 tablet (8 mg total) by mouth every 8 (eight) hours as needed for nausea or vomiting. 20 tablet 0 05/25/2021   Prenatal Vit-Fe Fumarate-FA (PRENATAL VITAMINS) 28-0.8 MG TABS Take 1 tablet by mouth daily. 30 tablet 11 05/26/2021   ACCU-CHEK GUIDE test strip       Accu-Chek Softclix Lancets lancets SMARTSIG:2 Topical Twice Daily      Continuous Blood Gluc Sensor (DEXCOM G6 SENSOR) MISC 1 each every 10 days 3 each 6    Continuous Blood Gluc Transmit (DEXCOM G6 TRANSMITTER) MISC 1 each by Does not apply route every 3 (three) months. 1 each 3    Insulin Pen Needle 31G X 5 MM MISC 1 Device by Does not apply route 4 (four) times daily. For use with insulin pens 100 each 11    LEVEMIR FLEXTOUCH 100 UNIT/ML  FlexTouch Pen       NIFEdipine (PROCARDIA XL) 30 MG 24 hr tablet Take 1 tablet (30 mg total) by mouth daily. 30 tablet 0    promethazine (PHENERGAN) 25 MG tablet Take 1 tablet (25 mg total) by mouth every 6 (six) hours as needed for nausea or vomiting. 30 tablet 3    scopolamine (TRANSDERM-SCOP, 1.5 MG,) 1 MG/3DAYS Place 1 patch (1.5 mg total) onto the skin every 3 (three) days. 10 patch 0     Review of Systems  Constitutional: Negative.  Negative for fatigue and fever.  HENT: Negative.    Respiratory: Negative.  Negative for shortness of breath.   Cardiovascular: Negative.  Negative for chest pain.  Gastrointestinal: Negative.  Negative for abdominal pain, constipation, diarrhea, nausea and vomiting.  Genitourinary: Negative.  Negative for dysuria.  Neurological: Negative.  Negative for dizziness and headaches.  Physical Exam   Blood pressure (!) 184/106, pulse 79, temperature 98.3 F (36.8 C), temperature source Oral, resp. rate 15, last menstrual period 10/14/2020, SpO2 100 %. Patient Vitals for the past 24 hrs:  BP Temp Temp src Pulse Resp SpO2  05/26/21 1301 (!) 149/85 -- -- 88 15 100 %  05/26/21 1255 (!) 157/94 -- -- 85 -- --  05/26/21 1240 (!) 153/89 -- -- 86 -- --  05/26/21 1231 (!) 145/88 -- -- 87 -- --  05/26/21 1221 (!) 148/86 -- -- 90 -- --  05/26/21 1211 (!) 150/90 -- -- 96 -- --  05/26/21 1201 (!) 151/84 -- -- 86 -- --  05/26/21 1151 (!) 147/83 -- -- 89 -- --  05/26/21 1141 (!) 149/86 -- -- 89 -- --  05/26/21 1131 (!) 162/86 -- -- 78 -- --  05/26/21 1117 (!) 177/97 -- -- 67 -- --  05/26/21 1101 (!) 173/107 -- -- 77 -- --  05/26/21 1046 (!) 184/106 98.3 F (36.8 C) Oral 79 15 100 %    Physical Exam Vitals and nursing note reviewed.  Constitutional:      General: She is not in acute distress.    Appearance: She is well-developed.  HENT:     Head: Normocephalic.  Eyes:     Pupils: Pupils are equal, round, and reactive to light.  Cardiovascular:     Rate and  Rhythm: Normal rate  and regular rhythm.     Heart sounds: Normal heart sounds.  Pulmonary:     Effort: Pulmonary effort is normal. No respiratory distress.     Breath sounds: Normal breath sounds.  Abdominal:     General: Bowel sounds are normal. There is no distension.     Palpations: Abdomen is soft.     Tenderness: There is no abdominal tenderness.  Skin:    General: Skin is warm and dry.  Neurological:     Mental Status: She is alert and oriented to person, place, and time.     Motor: No abnormal muscle tone.     Coordination: Coordination normal.     Deep Tendon Reflexes: Reflexes are normal and symmetric. Reflexes normal.  Psychiatric:        Behavior: Behavior normal.        Thought Content: Thought content normal.        Judgment: Judgment normal.   Fetal Tracing:  Baseline: 150 Variability: moderate Accels:  10x10 Decels: none  Toco: none   MAU Course  Procedures Results for orders placed or performed during the hospital encounter of 05/26/21 (from the past 24 hour(s))  Urinalysis, Routine w reflex microscopic Urine, Clean Catch     Status: Abnormal   Collection Time: 05/26/21 10:36 AM  Result Value Ref Range   Color, Urine YELLOW YELLOW   APPearance CLOUDY (A) CLEAR   Specific Gravity, Urine 1.015 1.005 - 1.030   pH 5.0 5.0 - 8.0   Glucose, UA NEGATIVE NEGATIVE mg/dL   Hgb urine dipstick NEGATIVE NEGATIVE   Bilirubin Urine NEGATIVE NEGATIVE   Ketones, ur NEGATIVE NEGATIVE mg/dL   Protein, ur 100 (A) NEGATIVE mg/dL   Nitrite NEGATIVE NEGATIVE   Leukocytes,Ua TRACE (A) NEGATIVE   RBC / HPF 0-5 0 - 5 RBC/hpf   WBC, UA 6-10 0 - 5 WBC/hpf   Bacteria, UA NONE SEEN NONE SEEN   Squamous Epithelial / LPF 21-50 0 - 5   Mucus PRESENT   Comprehensive metabolic panel     Status: Abnormal   Collection Time: 05/26/21 10:44 AM  Result Value Ref Range   Sodium 134 (L) 135 - 145 mmol/L   Potassium 3.7 3.5 - 5.1 mmol/L   Chloride 103 98 - 111 mmol/L   CO2 23 22 - 32  mmol/L   Glucose, Bld 110 (H) 70 - 99 mg/dL   BUN <5 (L) 6 - 20 mg/dL   Creatinine, Ser 0.75 0.44 - 1.00 mg/dL   Calcium 8.9 8.9 - 10.3 mg/dL   Total Protein 6.2 (L) 6.5 - 8.1 g/dL   Albumin 2.7 (L) 3.5 - 5.0 g/dL   AST 23 15 - 41 U/L   ALT 14 0 - 44 U/L   Alkaline Phosphatase 151 (H) 38 - 126 U/L   Total Bilirubin 0.6 0.3 - 1.2 mg/dL   GFR, Estimated >60 >60 mL/min   Anion gap 8 5 - 15  CBC     Status: Abnormal   Collection Time: 05/26/21 10:44 AM  Result Value Ref Range   WBC 4.6 4.0 - 10.5 K/uL   RBC 3.38 (L) 3.87 - 5.11 MIL/uL   Hemoglobin 11.3 (L) 12.0 - 15.0 g/dL   HCT 32.7 (L) 36.0 - 46.0 %   MCV 96.7 80.0 - 100.0 fL   MCH 33.4 26.0 - 34.0 pg   MCHC 34.6 30.0 - 36.0 g/dL   RDW 12.6 11.5 - 15.5 %   Platelets 265 150 - 400 K/uL     nRBC 0.0 0.0 - 0.2 %    MDM UA CBC, CMP, Protein/creat ratio Preeclampsia IV medication protocol- received 56m  and 14mapresoline and 2056mabetalol before BPs were out of severe range  After 2.5hrs, lab called to report that machine that runs P/CR is down and lab has not been started.   Consulted with Dr. BasElgie Congoecommends increasing Procardia to 60XL and reviewing preeclampsia protocols at length. [facility] scheduled for BP check early next week.   Assessment and Plan   1. Chronic hypertension with superimposed pre-eclampsia   2. [redacted] weeks gestation of pregnancy    -Discharge home in stable condition -Rx for procardia 60XL -Preeclampsia precautions discussed -[facility] -Patient may return to MAU as needed or if her condition were to change or worsen   CarWende MottM 05/26/2021, 10:55 AM

## 2021-05-26 NOTE — Progress Notes (Signed)
   Subjective:  Misty [name] is a 30 y.o. G2P0010 at [redacted]w[redacted]d being seen today for ongoing prenatal care.  She is currently monitored for the following issues for this high-risk pregnancy and has Chronic hypertension affecting pregnancy; Supervision of high risk pregnancy, antepartum; Type 2 diabetes mellitus complicating pregnancy in second trimester, antepartum; Abnormal antenatal AFP screen; Marijuana use; and Chronic hypertension with superimposed preeclampsia on their problem list.  Patient reports no complaints.  Contractions: Irritability. Vag. Bleeding: None.  Movement: Present. Denies leaking of fluid.   The following portions of the patient's history were reviewed and updated as appropriate: allergies, current medications, past family history, past medical history, past social history, past surgical history and problem list. Problem list updated.  Objective:   Vitals:   05/26/21 0917  BP: (!) 179/110  Pulse: 67  Weight: 209 lb (94.8 kg)    Fetal Status: Fetal Heart Rate (bpm): 155   Movement: Present     General:  Alert, oriented and cooperative. Patient is in no acute distress.  Skin: Skin is warm and dry. No rash noted.   Cardiovascular: Normal heart rate noted  Respiratory: Normal respiratory effort, no problems with respiration noted  Abdomen: Soft, gravid, appropriate for gestational age. Pain/Pressure: Absent     Pelvic: Vag. Bleeding: None     Cervical exam deferred        Extremities: Normal range of motion.  Edema: None  Mental Status: Normal mood and affect. Normal behavior. Normal judgment and thought content.     Urinalysis:      Assessment and Plan:  Pregnancy: G2P0010 at [redacted]w[redacted]d  1. Supervision of high risk pregnancy, antepartum BP is severe range, see below FHR normal  2. Chronic hypertension affecting pregnancy BP is severe range Was seen in MAU two days prior, had a few severe range BP's and then normalized, PreE labs notable for significant  elevation in P:C from 0.14 to 1.1 Asymptomatic Sent to MAU for evaluation and repeat labs, discussed she will likely get admitted  3. Type 2 diabetes mellitus complicating pregnancy in second trimester, antepartum Not addressed in this visit, sugars moderately well controlled, see photo of log  4. Abnormal antenatal AFP screen S/p counseling by MFM, declined amnio, [place] normal without identifiable cause  Preterm labor symptoms and general obstetric precautions including but not limited to vaginal bleeding, contractions, leaking of fluid and fetal movement were reviewed in detail with the patient. Please refer to After Visit Summary for other counseling recommendations.  Return in 2 weeks (on 06/09/2021) for [facility], ob visit, needs MD.   Venora Maples, MD

## 2021-05-29 ENCOUNTER — Other Ambulatory Visit: Payer: Self-pay

## 2021-05-29 ENCOUNTER — Inpatient Hospital Stay (HOSPITAL_COMMUNITY)
Admission: AD | Admit: 2021-05-29 | Discharge: 2021-06-02 | DRG: 832 | Disposition: A | Discharge status: Home / Self Care | Payer: 59 | Attending: Obstetrics & Gynecology | Admitting: Obstetrics & Gynecology

## 2021-05-29 ENCOUNTER — Encounter (HOSPITAL_COMMUNITY): Payer: Self-pay | Admitting: Obstetrics & Gynecology

## 2021-05-29 ENCOUNTER — Telehealth: Payer: 59 | Admitting: Physician Assistant

## 2021-05-29 DIAGNOSIS — O99019 Anemia complicating pregnancy, unspecified trimester: Secondary | ICD-10-CM | POA: Diagnosis present

## 2021-05-29 DIAGNOSIS — F1290 Cannabis use, unspecified, uncomplicated: Secondary | ICD-10-CM | POA: Diagnosis present

## 2021-05-29 DIAGNOSIS — Z3A31 31 weeks gestation of pregnancy: Secondary | ICD-10-CM | POA: Diagnosis not present

## 2021-05-29 DIAGNOSIS — O119 Pre-existing hypertension with pre-eclampsia, unspecified trimester: Secondary | ICD-10-CM | POA: Diagnosis present

## 2021-05-29 DIAGNOSIS — Z3A30 30 weeks gestation of pregnancy: Secondary | ICD-10-CM | POA: Diagnosis not present

## 2021-05-29 DIAGNOSIS — O10013 Pre-existing essential hypertension complicating pregnancy, third trimester: Secondary | ICD-10-CM | POA: Diagnosis present

## 2021-05-29 DIAGNOSIS — O99013 Anemia complicating pregnancy, third trimester: Secondary | ICD-10-CM | POA: Diagnosis present

## 2021-05-29 DIAGNOSIS — O24111 Pre-existing diabetes mellitus, type 2, in pregnancy, first trimester: Secondary | ICD-10-CM

## 2021-05-29 DIAGNOSIS — O1493 Unspecified pre-eclampsia, third trimester: Secondary | ICD-10-CM

## 2021-05-29 DIAGNOSIS — Z794 Long term (current) use of insulin: Secondary | ICD-10-CM

## 2021-05-29 DIAGNOSIS — O10919 Unspecified pre-existing hypertension complicating pregnancy, unspecified trimester: Secondary | ICD-10-CM

## 2021-05-29 DIAGNOSIS — O113 Pre-existing hypertension with pre-eclampsia, third trimester: Secondary | ICD-10-CM | POA: Diagnosis present

## 2021-05-29 DIAGNOSIS — Z87891 Personal history of nicotine dependence: Secondary | ICD-10-CM | POA: Diagnosis not present

## 2021-05-29 DIAGNOSIS — O0990 Supervision of high risk pregnancy, unspecified, unspecified trimester: Secondary | ICD-10-CM

## 2021-05-29 DIAGNOSIS — Z20822 Contact with and (suspected) exposure to covid-19: Secondary | ICD-10-CM | POA: Diagnosis present

## 2021-05-29 DIAGNOSIS — O24313 Unspecified pre-existing diabetes mellitus in pregnancy, third trimester: Secondary | ICD-10-CM

## 2021-05-29 DIAGNOSIS — O24113 Pre-existing diabetes mellitus, type 2, in pregnancy, third trimester: Secondary | ICD-10-CM | POA: Diagnosis present

## 2021-05-29 DIAGNOSIS — O99323 Drug use complicating pregnancy, third trimester: Secondary | ICD-10-CM | POA: Diagnosis present

## 2021-05-29 DIAGNOSIS — O280 Abnormal hematological finding on antenatal screening of mother: Secondary | ICD-10-CM

## 2021-05-29 DIAGNOSIS — E119 Type 2 diabetes mellitus without complications: Secondary | ICD-10-CM | POA: Diagnosis not present

## 2021-05-29 DIAGNOSIS — O24112 Pre-existing diabetes mellitus, type 2, in pregnancy, second trimester: Secondary | ICD-10-CM | POA: Diagnosis not present

## 2021-05-29 DIAGNOSIS — R112 Nausea with vomiting, unspecified: Secondary | ICD-10-CM

## 2021-05-29 DIAGNOSIS — O212 Late vomiting of pregnancy: Secondary | ICD-10-CM | POA: Diagnosis present

## 2021-05-29 DIAGNOSIS — O0993 Supervision of high risk pregnancy, unspecified, third trimester: Secondary | ICD-10-CM

## 2021-05-29 LAB — COMPREHENSIVE METABOLIC PANEL
ALT: 12 U/L (ref 0–44)
AST: 24 U/L (ref 15–41)
Albumin: 2.7 g/dL — ABNORMAL LOW (ref 3.5–5.0)
Alkaline Phosphatase: 173 U/L — ABNORMAL HIGH (ref 38–126)
Anion gap: 9 (ref 5–15)
BUN: <5 mg/dL — ABNORMAL LOW (ref 6–20)
CO2: 23 mmol/L (ref 22–32)
Calcium: 8.7 mg/dL — ABNORMAL LOW (ref 8.9–10.3)
Chloride: 104 mmol/L (ref 98–111)
Creatinine, Ser: 0.8 mg/dL (ref 0.44–1.00)
GFR, Estimated: >60 mL/min (ref >60)
Glucose, Bld: 98 mg/dL (ref 70–99)
Potassium: 3.8 mmol/L (ref 3.5–5.1)
Sodium: 136 mmol/L (ref 135–145)
Total Bilirubin: 0.3 mg/dL (ref 0.3–1.2)
Total Protein: 6.2 g/dL — ABNORMAL LOW (ref 6.5–8.1)

## 2021-05-29 LAB — CBC
HCT: 33.9 % — ABNORMAL LOW (ref 36.0–46.0)
Hemoglobin: 11.7 g/dL — ABNORMAL LOW (ref 12.0–15.0)
MCH: 33.3 pg (ref 26.0–34.0)
MCHC: 34.5 g/dL (ref 30.0–36.0)
MCV: 96.6 fL (ref 80.0–100.0)
Platelets: 277 10*3/uL (ref 150–400)
RBC: 3.51 MIL/uL — ABNORMAL LOW (ref 3.87–5.11)
RDW: 12.6 % (ref 11.5–15.5)
WBC: 5.3 10*3/uL (ref 4.0–10.5)
nRBC: 0 % (ref 0.0–0.2)

## 2021-05-29 LAB — RESP PANEL BY RT-PCR (FLU A&B, COVID) ARPGX2
Influenza A by PCR: NEGATIVE
Influenza B by PCR: NEGATIVE
SARS Coronavirus 2 by RT PCR: NEGATIVE

## 2021-05-29 LAB — TYPE AND SCREEN
ABO/RH(D): O POS
Antibody Screen: NEGATIVE

## 2021-05-29 LAB — URINALYSIS, ROUTINE W REFLEX MICROSCOPIC
Bacteria, UA: NONE SEEN
Bilirubin Urine: NEGATIVE
Glucose, UA: NEGATIVE mg/dL
Hgb urine dipstick: NEGATIVE
Ketones, ur: 20 mg/dL — AB
Leukocytes,Ua: NEGATIVE
Nitrite: NEGATIVE
Protein, ur: 100 mg/dL — AB
Specific Gravity, Urine: 1.01 (ref 1.005–1.030)
pH: 7 (ref 5.0–8.0)

## 2021-05-29 LAB — PROTEIN / CREATININE RATIO, URINE
Creatinine, Urine: 103.57 mg/dL
Protein Creatinine Ratio: 1.37 mg/mg{Cre} — ABNORMAL HIGH (ref 0.00–0.15)
Total Protein, Urine: 142 mg/dL

## 2021-05-29 LAB — GLUCOSE, CAPILLARY: Glucose-Capillary: 199 mg/dL — ABNORMAL HIGH (ref 70–99)

## 2021-05-29 MED ORDER — FAMOTIDINE 20 MG PO TABS
10.0000 mg | ORAL_TABLET | Freq: Two times a day (BID) | ORAL | Status: DC
  Administered 2021-05-29 – 2021-06-02 (×8): 10 mg via ORAL
  Filled 2021-05-29 (×8): qty 1

## 2021-05-29 MED ORDER — CALCIUM CARBONATE ANTACID 500 MG PO CHEW: 2.0000 | CHEWABLE_TABLET | ORAL | Status: DC | PRN

## 2021-05-29 MED ORDER — ONDANSETRON 4 MG PO TBDP
8.0000 mg | ORAL_TABLET | Freq: Once | ORAL | Status: AC
  Administered 2021-05-29: 8 mg via ORAL
  Filled 2021-05-29: qty 2

## 2021-05-29 MED ORDER — INSULIN ASPART 100 UNIT/ML IJ SOLN
0.0000 [IU] | Freq: Three times a day (TID) | INTRAMUSCULAR | Status: DC
  Administered 2021-05-29: 4 [IU] via SUBCUTANEOUS
  Administered 2021-05-30 – 2021-05-31 (×3): 3 [IU] via SUBCUTANEOUS
  Administered 2021-05-31 – 2021-06-01 (×4): 2 [IU] via SUBCUTANEOUS
  Administered 2021-06-01: 3 [IU] via SUBCUTANEOUS
  Administered 2021-06-02: 2 [IU] via SUBCUTANEOUS

## 2021-05-29 MED ORDER — ACETAMINOPHEN 325 MG PO TABS
650.0000 mg | ORAL_TABLET | ORAL | Status: DC | PRN
  Administered 2021-05-29 – 2021-05-30 (×2): 650 mg via ORAL
  Filled 2021-05-29 (×2): qty 2

## 2021-05-29 MED ORDER — LABETALOL HCL 5 MG/ML IV SOLN: 40.0000 mg | INTRAVENOUS | Status: DC | PRN

## 2021-05-29 MED ORDER — SCOPOLAMINE 1 MG/3DAYS TD PT72
1.0000 | MEDICATED_PATCH | TRANSDERMAL | Status: DC
  Administered 2021-05-29 – 2021-06-01 (×2): 1.5 mg via TRANSDERMAL
  Filled 2021-05-29 (×2): qty 1

## 2021-05-29 MED ORDER — HYDRALAZINE HCL 20 MG/ML IJ SOLN
5.0000 mg | INTRAMUSCULAR | Status: DC | PRN
  Administered 2021-05-29: 5 mg via INTRAVENOUS
  Filled 2021-05-29: qty 1

## 2021-05-29 MED ORDER — PROMETHAZINE HCL 25 MG PO TABS
25.0000 mg | ORAL_TABLET | Freq: Four times a day (QID) | ORAL | Status: DC | PRN
  Administered 2021-05-29 – 2021-05-30 (×2): 25 mg via ORAL
  Filled 2021-05-29 (×2): qty 1

## 2021-05-29 MED ORDER — ONDANSETRON 4 MG PO TBDP
8.0000 mg | ORAL_TABLET | Freq: Three times a day (TID) | ORAL | Status: DC | PRN
  Administered 2021-05-30 – 2021-06-02 (×4): 8 mg via ORAL
  Filled 2021-05-29 (×4): qty 2

## 2021-05-29 MED ORDER — PRENATAL VITAMINS 28-0.8 MG PO TABS: 1.0000 | ORAL_TABLET | Freq: Every day | ORAL | Status: DC

## 2021-05-29 MED ORDER — LABETALOL HCL 200 MG PO TABS
200.0000 mg | ORAL_TABLET | Freq: Two times a day (BID) | ORAL | Status: DC
  Administered 2021-05-29 – 2021-06-02 (×8): 200 mg via ORAL
  Filled 2021-05-29 (×9): qty 1

## 2021-05-29 MED ORDER — LACTATED RINGERS IV SOLN: INTRAVENOUS | Status: DC

## 2021-05-29 MED ORDER — ASPIRIN EC 81 MG PO TBEC
81.0000 mg | DELAYED_RELEASE_TABLET | Freq: Every day | ORAL | Status: DC
  Administered 2021-05-29 – 2021-06-02 (×5): 81 mg via ORAL
  Filled 2021-05-29 (×5): qty 1

## 2021-05-29 MED ORDER — NIFEDIPINE ER OSMOTIC RELEASE 60 MG PO TB24
60.0000 mg | ORAL_TABLET | Freq: Two times a day (BID) | ORAL | Status: DC
  Administered 2021-05-29 – 2021-06-02 (×8): 60 mg via ORAL
  Filled 2021-05-29 (×8): qty 1

## 2021-05-29 MED ORDER — HYDRALAZINE HCL 20 MG/ML IJ SOLN
10.0000 mg | INTRAMUSCULAR | Status: DC | PRN
  Administered 2021-05-29: 10 mg via INTRAVENOUS

## 2021-05-29 MED ORDER — ZOLPIDEM TARTRATE 5 MG PO TABS: 5.0000 mg | ORAL_TABLET | Freq: Every evening | ORAL | Status: DC | PRN

## 2021-05-29 MED ORDER — LABETALOL HCL 5 MG/ML IV SOLN
20.0000 mg | INTRAVENOUS | Status: DC | PRN
  Administered 2021-05-29: 20 mg via INTRAVENOUS
  Filled 2021-05-29: qty 4

## 2021-05-29 MED ORDER — INSULIN GLARGINE-YFGN 100 UNIT/ML ~~LOC~~ SOLN
16.0000 [IU] | Freq: Two times a day (BID) | SUBCUTANEOUS | Status: DC
  Administered 2021-05-29: 16 [IU] via SUBCUTANEOUS
  Filled 2021-05-29 (×2): qty 0.16

## 2021-05-29 MED ORDER — INSULIN ASPART 100 UNIT/ML IJ SOLN: 0.0000 [IU] | Freq: Three times a day (TID) | INTRAMUSCULAR | Status: DC

## 2021-05-29 MED ORDER — DOCUSATE SODIUM 100 MG PO CAPS
100.0000 mg | ORAL_CAPSULE | Freq: Every day | ORAL | Status: DC
  Administered 2021-05-30 – 2021-06-02 (×3): 100 mg via ORAL
  Filled 2021-05-29 (×4): qty 1

## 2021-05-29 MED ORDER — PRENATAL MULTIVITAMIN CH
1.0000 | ORAL_TABLET | Freq: Every day | ORAL | Status: DC
  Administered 2021-05-30 – 2021-06-02 (×4): 1 via ORAL
  Filled 2021-05-29 (×4): qty 1

## 2021-05-29 NOTE — H&P (Signed)
History     CSN: [id number]  Arrival date and time: 05/29/21 1511   Event Date/Time   First Provider Initiated Contact with Patient 05/29/21 1512      Chief Complaint  Patient presents with   Nausea    HPI Misty [name] is a 30 y.o. G2P0010 at 41w6dwho presents with nausea. She states since last night she has had nausea and vomiting that she feels like is related to her BP medicine. She reports she hasn't been able to keep anything down.   Upon arrival, patient has severe range blood pressures. She denies any HA, visual changes or epigastric pain. She states she has been taking her Procardia as prescribed at home. This is her 4th visit to MAU/office this week and each time she has severe range BPs requiring IV antihypertensives.   Her pregnancy is also complicated by Type 2 DM requiring insulin. Patient states she has been taking her insulin as prescribed.  OB History     Gravida  2   Para  0   Term  0   Preterm  0   AB  1   Living  0      SAB  1   IAB  0   Ectopic  0   Multiple  0   Live Births              Past Medical History:  Diagnosis Date   Abscess 09/02/2020   Allergy    Anemia 2015   Asthma    as child   Diabetes mellitus without complication ([place] [address]  [place] [place]   Eczema    Hypertension 09/2013    Past Surgical History:  Procedure Laterality Date   Abcess on back      Family History  Problem Relation Age of Onset   Hypertension Mother    Heart disease Mother 43      CHF   Diabetes Mother    Sickle cell anemia Brother    Hypertension Maternal Grandfather    Diabetes Maternal Grandfather    Heart disease Maternal Grandfather    Healthy Father    Diabetes Maternal Grandmother    Heart disease Maternal Grandmother     Social History   Tobacco Use   Smoking status: Former    Packs/day: 0.50    Years: 2.00    Pack years: 1.00    Types: Cigarettes    Quit date: 09/08/2013     Years since quitting: 7.7   Smokeless tobacco: Never  Vaping Use   Vaping Use: Never used  Substance Use Topics   Alcohol use: No    Alcohol/week: 0.0 standard drinks   Drug use: No    Allergies:  Allergies  Allergen Reactions   Iodides Anaphylaxis and Itching   Morphine And Related Anaphylaxis and Itching   Shellfish Allergy Anaphylaxis and Itching    Pt reports "watery eyes"   Ultram [Tramadol Hcl] Hives    Hives and swollen lips     Medications Prior to Admission  Medication Sig Dispense Refill Last Dose   ACCU-CHEK GUIDE test strip       Accu-Chek Softclix Lancets lancets SMARTSIG:2 Topical Twice Daily      aspirin EC 81 MG tablet Take 1 tablet (81 mg total) by mouth daily. Swallow whole. 90 tablet 3    blood glucose meter kit and supplies Dispense based on patient and insurance preference. Use up to four times daily  as directed. (FOR ICD-10 E10.9, E11.9). 1 each 0    Blood Pressure Monitoring DEVI 1 each by Does not apply route once a week. 1 each 0    Continuous Blood Gluc Sensor (DEXCOM G6 SENSOR) MISC 1 each every 10 days 3 each 6    Continuous Blood Gluc Transmit (DEXCOM G6 TRANSMITTER) MISC 1 each by Does not apply route every 3 (three) months. 1 each 3    famotidine (PEPCID) 10 MG tablet Take 1 tablet (10 mg total) by mouth 2 (two) times daily. 60 tablet 0    insulin aspart (NOVOLOG) 100 UNIT/ML injection For use in Omnipod.  Current TDD 66 units. Patient to adjust per MD orders. (Patient taking differently: For use in Omnipod.  Current TDD 66 units. Patient to adjust per MD orders.) 30 mL 12    insulin glargine (LANTUS) 100 UNIT/ML Solostar Pen Inject 16 Units into the skin 2 (two) times daily. 16 units with breakfast and 16 units just before bed 15 mL 11    Insulin Pen Needle 31G X 5 MM MISC 1 Device by Does not apply route 4 (four) times daily. For use with insulin pens 100 each 11    LEVEMIR FLEXTOUCH 100 UNIT/ML FlexTouch Pen       NIFEdipine (PROCARDIA XL/NIFEDICAL  XL) 60 MG 24 hr tablet Take 1 tablet (60 mg total) by mouth daily. 30 tablet 2    ondansetron (ZOFRAN ODT) 8 MG disintegrating tablet Take 1 tablet (8 mg total) by mouth every 8 (eight) hours as needed for nausea or vomiting. 20 tablet 0    Prenatal Vit-Fe Fumarate-FA (PRENATAL VITAMINS) 28-0.8 MG TABS Take 1 tablet by mouth daily. 30 tablet 11    promethazine (PHENERGAN) 25 MG tablet Take 1 tablet (25 mg total) by mouth every 6 (six) hours as needed for nausea or vomiting. 30 tablet 3    scopolamine (TRANSDERM-SCOP, 1.5 MG,) 1 MG/3DAYS Place 1 patch (1.5 mg total) onto the skin every 3 (three) days. 10 patch 0     Review of Systems  Constitutional: Negative.  Negative for fatigue and fever.  HENT: Negative.    Respiratory: Negative.  Negative for shortness of breath.   Cardiovascular: Negative.  Negative for chest pain.  Gastrointestinal:  Positive for nausea and vomiting. Negative for abdominal pain, constipation and diarrhea.  Genitourinary: Negative.  Negative for dysuria, vaginal bleeding and vaginal discharge.  Neurological: Negative.  Negative for dizziness and headaches.  Physical Exam   Blood pressure (!) 177/97, pulse 95, temperature 99.5 F (37.5 C), temperature source Oral, resp. rate 15, last menstrual period 10/14/2020, SpO2 100 %.  Patient Vitals for the past 24 hrs:  BP Temp Temp src Pulse Resp SpO2  05/29/21 1541 (!) 177/97 -- -- 95 -- --  05/29/21 1535 (!) 157/95 -- -- 90 -- 100 %  05/29/21 1530 (!) 162/100 -- -- 91 -- 100 %  05/29/21 1516 (!) 163/105 99.5 F (37.5 C) Oral (!) 107 15 --    Physical Exam Vitals and nursing note reviewed.  Constitutional:      General: She is not in acute distress.    Appearance: She is well-developed.  HENT:     Head: Normocephalic.  Eyes:     Pupils: Pupils are equal, round, and reactive to light.  Cardiovascular:     Rate and Rhythm: Normal rate and regular rhythm.     Heart sounds: Normal heart sounds.  Pulmonary:      Effort: Pulmonary effort is  normal. No respiratory distress.     Breath sounds: Normal breath sounds.  Abdominal:     General: Bowel sounds are normal. There is no distension.     Palpations: Abdomen is soft.     Tenderness: There is no abdominal tenderness.  Skin:    General: Skin is warm and dry.  Neurological:     Mental Status: She is alert and oriented to person, place, and time.  Psychiatric:        Mood and Affect: Mood normal.        Behavior: Behavior normal.        Thought Content: Thought content normal.        Judgment: Judgment normal.   Fetal Tracing:  Baseline: 150 Variability: moderate Accels: 15x15 Decels: none  Toco: ui  MAU Course  Procedures  MDM UA CBC, CMP, Protein/creat ratio  Consulted with Dr. Nehemiah Settle- recommends admission to [facility] for BP management. Dr. Roselie Awkward made aware. Patient agreeable to plan of care.   Assessment and Plan  -Chronic hypertension with super imposed preeclampsia. -Uncontrolled hypertension -[redacted] weeks gestation  -Admit to [facility] turned over to MD  [place] 05/29/2021, 3:12 PM

## 2021-05-29 NOTE — MAU Note (Signed)
.  [name] is a 30 y.o. at [redacted]w[redacted]d here in MAU reporting: here EMS for n/v. Has vomited 6x in the last 24 hours. Denies VB or LOF. Endorses good fetal movement.   Pain score: 0

## 2021-05-29 NOTE — Progress Notes (Signed)
Virtual Visit Consent   De Nurse, you are scheduled for a virtual visit with a [facility] provider today.     Just as with appointments in the office, your consent must be obtained to participate.  Your consent will be active for this visit and any virtual visit you may have with one of our providers in the next 365 days.     If you have a MyChart account, a copy of this consent can be sent to you electronically.  All virtual visits are billed to your insurance company just like a traditional visit in the office.    As this is a virtual visit, video technology does not allow for your provider to perform a traditional examination.  This may limit your provider's ability to fully assess your condition.  If your provider identifies any concerns that need to be evaluated in person or the need to arrange testing (such as labs, EKG, etc.), we will make arrangements to do so.     Although advances in technology are sophisticated, we cannot ensure that it will always work on either your end or our end.  If the connection with a video visit is poor, the visit may have to be switched to a telephone visit.  With either a video or telephone visit, we are not always able to ensure that we have a secure connection.     I need to obtain your verbal consent now.   Are you willing to proceed with your visit today?    De Nurse has provided verbal consent on 05/29/2021 for a virtual visit (video or telephone).   Leeanne Rio, [place]   Date: 05/29/2021 9:21 AM   Virtual Visit via Video Note   I, Leeanne Rio, connected with  AMAND LEMOINE  ([id number], 12-Jan-1991) on 05/29/21 at  9:30 AM EDT by a video-enabled telemedicine application and verified that I am speaking with the correct person using two identifiers.  Location: Patient: Virtual Visit Location Patient: Home Provider: Virtual Visit Location Provider: Home Office   I discussed the limitations of evaluation and  management by telemedicine and the availability of in person appointments. The patient expressed understanding and agreed to proceed.    History of Present Illness: Misty [name] is a 30 y.o. who identifies as a female who was assigned female at birth, and is being seen today for vomiting since last Friday. Is [redacted] weeks pregnant with high-risk pregnancy and pre-eclampsia, most recently evaluated last week in ER. Notes nause and non-bloody emesis about 4-5 x daily. Usually when trying to eat or drink so she has avoided putting much into her system since it induces vomiting. Has associated headache. Denies dizziness, chest pain or SOB. Denies diarrhea, fever. Has not reached out to her OB as of yet because she is not sure what their number is and cannot find them to send a MyChart message. Marland Kitchen  HPI: HPI  Problems:  Patient Active Problem List   Diagnosis Date Noted   Chronic hypertension with superimposed pre-eclampsia 05/26/2021   Marijuana use 05/24/2021   Chronic hypertension with superimposed preeclampsia 05/24/2021   Abnormal antenatal AFP screen 02/17/2021   Type 2 diabetes mellitus complicating pregnancy in second trimester, antepartum 01/03/2021   Supervision of high risk pregnancy, antepartum 12/27/2020   Chronic hypertension affecting pregnancy 09/2013    Allergies:  Allergies  Allergen Reactions   Iodides Anaphylaxis and Itching   Morphine And Related Anaphylaxis and Itching   Shellfish  Allergy Anaphylaxis and Itching    Pt reports "watery eyes"   Ultram [Tramadol Hcl] Hives    Hives and swollen lips    Medications:  Current Outpatient Medications:    ACCU-CHEK GUIDE test strip, , Disp: , Rfl:    Accu-Chek Softclix Lancets lancets, SMARTSIG:2 Topical Twice Daily, Disp: , Rfl:    aspirin EC 81 MG tablet, Take 1 tablet (81 mg total) by mouth daily. Swallow whole., Disp: 90 tablet, Rfl: 3   blood glucose meter kit and supplies, Dispense based on patient and insurance preference.  Use up to four times daily as directed. (FOR ICD-10 E10.9, E11.9)., Disp: 1 each, Rfl: 0   Blood Pressure Monitoring DEVI, 1 each by Does not apply route once a week., Disp: 1 each, Rfl: 0   Continuous Blood Gluc Sensor (DEXCOM G6 SENSOR) MISC, 1 each every 10 days, Disp: 3 each, Rfl: 6   Continuous Blood Gluc Transmit (DEXCOM G6 TRANSMITTER) MISC, 1 each by Does not apply route every 3 (three) months., Disp: 1 each, Rfl: 3   famotidine (PEPCID) 10 MG tablet, Take 1 tablet (10 mg total) by mouth 2 (two) times daily., Disp: 60 tablet, Rfl: 0   insulin aspart (NOVOLOG) 100 UNIT/ML injection, For use in Omnipod.  Current TDD 66 units. Patient to adjust per MD orders. (Patient taking differently: For use in Omnipod.  Current TDD 66 units. Patient to adjust per MD orders.), Disp: 30 mL, Rfl: 12   insulin glargine (LANTUS) 100 UNIT/ML Solostar Pen, Inject 16 Units into the skin 2 (two) times daily. 16 units with breakfast and 16 units just before bed, Disp: 15 mL, Rfl: 11   Insulin Pen Needle 31G X 5 MM MISC, 1 Device by Does not apply route 4 (four) times daily. For use with insulin pens, Disp: 100 each, Rfl: 11   LEVEMIR FLEXTOUCH 100 UNIT/ML FlexTouch Pen, , Disp: , Rfl:    NIFEdipine (PROCARDIA XL/NIFEDICAL XL) 60 MG 24 hr tablet, Take 1 tablet (60 mg total) by mouth daily., Disp: 30 tablet, Rfl: 2   ondansetron (ZOFRAN ODT) 8 MG disintegrating tablet, Take 1 tablet (8 mg total) by mouth every 8 (eight) hours as needed for nausea or vomiting., Disp: 20 tablet, Rfl: 0   Prenatal Vit-Fe Fumarate-FA (PRENATAL VITAMINS) 28-0.8 MG TABS, Take 1 tablet by mouth daily., Disp: 30 tablet, Rfl: 11   promethazine (PHENERGAN) 25 MG tablet, Take 1 tablet (25 mg total) by mouth every 6 (six) hours as needed for nausea or vomiting., Disp: 30 tablet, Rfl: 3   scopolamine (TRANSDERM-SCOP, 1.5 MG,) 1 MG/3DAYS, Place 1 patch (1.5 mg total) onto the skin every 3 (three) days., Disp: 10 patch, Rfl:  0  Observations/Objective: Patient is well-developed, well-nourished in no acute distress.  Resting comfortably at home.  Head is normocephalic, atraumatic.  No labored breathing. Speech is clear and coherent with logical content.  Patient is alert and oriented at baseline.   Assessment and Plan: 1. High-risk pregnancy in third trimester  2. Intractable vomiting with nausea, unspecified vomiting type  3. Pre-eclampsia in third trimester She was given contact info for her OB so she can reach out directly to them for guidance. Discussed if unable to reach them, she is to return tot he ER and [facility] for immediate evaluation.   Follow Up Instructions: I discussed the assessment and treatment plan with the patient. The patient was provided an opportunity to ask questions and all were answered. The patient agreed  with the plan and demonstrated an understanding of the instructions.  A copy of instructions were sent to the patient via MyChart unless otherwise noted below.    The patient was advised to call back or seek an in-person evaluation if the symptoms worsen or if the condition fails to improve as anticipated.  Time:  I spent 10 minutes with the patient via telehealth technology discussing the above problems/concerns.    Leeanne Rio, PA-C

## 2021-05-29 NOTE — Patient Instructions (Signed)
**Note Misty-Identified via Obfuscation** Misty [name], thank you for joining Leeanne Rio, PA-C for today's virtual visit.  While this provider is not your primary care provider (PCP), if your PCP is located in our provider database this encounter information will be shared with them immediately following your visit.  Consent: (Patient) Misty [name] provided verbal consent for this virtual visit at the beginning of the encounter.  Current Medications:  Current Outpatient Medications:    ACCU-CHEK GUIDE test strip, , Disp: , Rfl:    Accu-Chek Softclix Lancets lancets, SMARTSIG:2 Topical Twice Daily, Disp: , Rfl:    aspirin EC 81 MG tablet, Take 1 tablet (81 mg total) by mouth daily. Swallow whole., Disp: 90 tablet, Rfl: 3   blood glucose meter kit and supplies, Dispense based on patient and insurance preference. Use up to four times daily as directed. (FOR ICD-10 E10.9, E11.9)., Disp: 1 each, Rfl: 0   Blood Pressure Monitoring DEVI, 1 each by Does not apply route once a week., Disp: 1 each, Rfl: 0   Continuous Blood Gluc Sensor (DEXCOM G6 SENSOR) MISC, 1 each every 10 days, Disp: 3 each, Rfl: 6   Continuous Blood Gluc Transmit (DEXCOM G6 TRANSMITTER) MISC, 1 each by Does not apply route every 3 (three) months., Disp: 1 each, Rfl: 3   famotidine (PEPCID) 10 MG tablet, Take 1 tablet (10 mg total) by mouth 2 (two) times daily., Disp: 60 tablet, Rfl: 0   insulin aspart (NOVOLOG) 100 UNIT/ML injection, For use in Omnipod.  Current TDD 66 units. Patient to adjust per MD orders. (Patient taking differently: For use in Omnipod.  Current TDD 66 units. Patient to adjust per MD orders.), Disp: 30 mL, Rfl: 12   insulin glargine (LANTUS) 100 UNIT/ML Solostar Pen, Inject 16 Units into the skin 2 (two) times daily. 16 units with breakfast and 16 units just before bed, Disp: 15 mL, Rfl: 11   Insulin Pen Needle 31G X 5 MM MISC, 1 Device by Does not apply route 4 (four) times daily. For use with insulin pens, Disp: 100 each, Rfl: 11    LEVEMIR FLEXTOUCH 100 UNIT/ML FlexTouch Pen, , Disp: , Rfl:    NIFEdipine (PROCARDIA XL/NIFEDICAL XL) 60 MG 24 hr tablet, Take 1 tablet (60 mg total) by mouth daily., Disp: 30 tablet, Rfl: 2   ondansetron (ZOFRAN ODT) 8 MG disintegrating tablet, Take 1 tablet (8 mg total) by mouth every 8 (eight) hours as needed for nausea or vomiting., Disp: 20 tablet, Rfl: 0   Prenatal Vit-Fe Fumarate-FA (PRENATAL VITAMINS) 28-0.8 MG TABS, Take 1 tablet by mouth daily., Disp: 30 tablet, Rfl: 11   promethazine (PHENERGAN) 25 MG tablet, Take 1 tablet (25 mg total) by mouth every 6 (six) hours as needed for nausea or vomiting., Disp: 30 tablet, Rfl: 3   scopolamine (TRANSDERM-SCOP, 1.5 MG,) 1 MG/3DAYS, Place 1 patch (1.5 mg total) onto the skin every 3 (three) days., Disp: 10 patch, Rfl: 0   Medications ordered in this encounter:  No orders of the defined types were placed in this encounter.    *If you need refills on other medications prior to your next appointment, please contact your pharmacy*  Follow-Up: Call back or seek an in-person evaluation if the symptoms worsen or if the condition fails to improve as anticipated.  Other Instructions Contact your OB office immediately at [phone] for assessment.  If unable to reach them I want you to return to the ER at [facility] for immediate evaluation. Do not delay  care.    If you have been instructed to have an in-person evaluation today at a local Urgent Care facility, please use the link below. It will take you to a list of all of our available [facility], including address, phone number and hours of operation. Please do not delay care.  [facility]  If you or a family member do not have a primary care provider, use the link below to schedule a visit and establish care. When you choose a [facility] primary care physician or advanced practice provider, you gain a long-term partner in health. Find a Primary Care  Provider  Learn more about [facility]'s in-office and virtual care options: [facility]

## 2021-05-30 ENCOUNTER — Encounter (HOSPITAL_COMMUNITY): Payer: Self-pay | Admitting: Obstetrics & Gynecology

## 2021-05-30 DIAGNOSIS — Z3A31 31 weeks gestation of pregnancy: Secondary | ICD-10-CM

## 2021-05-30 DIAGNOSIS — O119 Pre-existing hypertension with pre-eclampsia, unspecified trimester: Secondary | ICD-10-CM

## 2021-05-30 DIAGNOSIS — O24112 Pre-existing diabetes mellitus, type 2, in pregnancy, second trimester: Secondary | ICD-10-CM | POA: Diagnosis not present

## 2021-05-30 DIAGNOSIS — O99013 Anemia complicating pregnancy, third trimester: Secondary | ICD-10-CM | POA: Diagnosis present

## 2021-05-30 DIAGNOSIS — O99019 Anemia complicating pregnancy, unspecified trimester: Secondary | ICD-10-CM | POA: Diagnosis present

## 2021-05-30 LAB — COMPREHENSIVE METABOLIC PANEL
ALT: 13 U/L (ref 0–44)
AST: 21 U/L (ref 15–41)
Albumin: 2.3 g/dL — ABNORMAL LOW (ref 3.5–5.0)
Alkaline Phosphatase: 142 U/L — ABNORMAL HIGH (ref 38–126)
Anion gap: 6 (ref 5–15)
BUN: <5 mg/dL — ABNORMAL LOW (ref 6–20)
CO2: 22 mmol/L (ref 22–32)
Calcium: 8.3 mg/dL — ABNORMAL LOW (ref 8.9–10.3)
Chloride: 105 mmol/L (ref 98–111)
Creatinine, Ser: 0.85 mg/dL (ref 0.44–1.00)
GFR, Estimated: >60 mL/min (ref >60)
Glucose, Bld: 135 mg/dL — ABNORMAL HIGH (ref 70–99)
Potassium: 3.4 mmol/L — ABNORMAL LOW (ref 3.5–5.1)
Sodium: 133 mmol/L — ABNORMAL LOW (ref 135–145)
Total Bilirubin: 0.5 mg/dL (ref 0.3–1.2)
Total Protein: 5.4 g/dL — ABNORMAL LOW (ref 6.5–8.1)

## 2021-05-30 LAB — CBC
HCT: 28.8 % — ABNORMAL LOW (ref 36.0–46.0)
Hemoglobin: 9.8 g/dL — ABNORMAL LOW (ref 12.0–15.0)
MCH: 32.6 pg (ref 26.0–34.0)
MCHC: 34 g/dL (ref 30.0–36.0)
MCV: 95.7 fL (ref 80.0–100.0)
Platelets: 239 10*3/uL (ref 150–400)
RBC: 3.01 MIL/uL — ABNORMAL LOW (ref 3.87–5.11)
RDW: 12.6 % (ref 11.5–15.5)
WBC: 6.1 10*3/uL (ref 4.0–10.5)
nRBC: 0 % (ref 0.0–0.2)

## 2021-05-30 LAB — GLUCOSE, CAPILLARY
Glucose-Capillary: 129 mg/dL — ABNORMAL HIGH (ref 70–99)
Glucose-Capillary: 122 mg/dL — ABNORMAL HIGH (ref 70–99)
Glucose-Capillary: 98 mg/dL (ref 70–99)

## 2021-05-30 MED ORDER — POTASSIUM CHLORIDE CRYS ER 20 MEQ PO TBCR
20.0000 meq | EXTENDED_RELEASE_TABLET | Freq: Two times a day (BID) | ORAL | Status: AC
  Administered 2021-05-30 – 2021-05-31 (×4): 20 meq via ORAL
  Filled 2021-05-30 (×5): qty 1

## 2021-05-30 MED ORDER — SODIUM CHLORIDE 0.9 % IV SOLN
INTRAVENOUS | Status: DC | PRN
  Administered 2021-05-30: 250 mL via INTRAVENOUS

## 2021-05-30 MED ORDER — INSULIN GLARGINE-YFGN 100 UNIT/ML ~~LOC~~ SOLN
18.0000 [IU] | Freq: Two times a day (BID) | SUBCUTANEOUS | Status: DC
  Administered 2021-05-30 – 2021-06-02 (×7): 18 [IU] via SUBCUTANEOUS
  Filled 2021-05-30 (×7): qty 0.18

## 2021-05-30 MED ORDER — SODIUM CHLORIDE 0.9 % IV SOLN
500.0000 mg | Freq: Once | INTRAVENOUS | Status: DC
  Filled 2021-05-30: qty 25

## 2021-05-30 MED ORDER — SODIUM CHLORIDE 0.9% FLUSH
3.0000 mL | Freq: Two times a day (BID) | INTRAVENOUS | Status: DC
  Administered 2021-05-30 – 2021-06-02 (×6): 3 mL via INTRAVENOUS

## 2021-05-30 NOTE — Progress Notes (Signed)
Pt called out saying her hand was swollen.  PIV in Right AC - site WNL.  Right hand slightly larger than left with scant generalized edema.  Pt pointed out raised area on posterior hand near bend of wrist.  Palpation reveals 2cm x .5cm nodule like area.  Patient denies pain with palpation.  Pt. Has consumed copious amounts of food and fluids this morning and still has an order for IVFs.  Called Dr. Debroah Loop with above information.  See order to convert IV to NSL.  He said he will examen patient's hand the next time he sees her.

## 2021-05-30 NOTE — Progress Notes (Signed)
Inpatient Diabetes Program Recommendations  Diabetes Treatment Program Recommendations  ADA Standards of Care 2018 Diabetes in Pregnancy Target Glucose Ranges:  Fasting: 60 - 90 mg/dL Preprandial: 60 - 967 mg/dL 1 hr postprandial: Less than 140mg /dL (from first bite of meal) 2 hr postprandial: Less than 120 mg/dL (from first bite of meal)      Lab Results  Component Value Date   GLUCAP 199 (H) 05/29/2021   HGBA1C 7.0 (A) 03/09/2021    Review of Glycemic Control Results for Misty [name], Misty [name] (MRN [name]) as of 05/30/2021 09:02  Ref. Range 05/30/2021 03:59  Glucose Latest Ref Range: 70 - 99 mg/dL 06/01/2021 (H)   Diabetes history: Type 2 DM Outpatient Diabetes medications: Novolog- Omnipod, Lantus 16 units BID (for backup) Current orders for Inpatient glycemic control: Novolog 0-16 units TID, Semglee 18 units BID  Inpatient Diabetes Program Recommendations:    Noted consult. In agreement with current orders.  Following.   Thanks, 102, MSN, RNC-OB Diabetes Coordinator [phone] (8a-5p)

## 2021-05-30 NOTE — Progress Notes (Signed)
FACULTY PRACTICE ANTEPARTUM COMPREHENSIVE PROGRESS NOTE  Misty [name] is a 30 y.o. G2P0010 at [redacted]w[redacted]d who is admitted for BP control in the setting of N/V, known CHTN with superimposed preeclampsia. Estimated Date of Delivery: 08/01/21 Fetal presentation is unsure.  Length of Stay:  1 Days. Admitted 05/29/2021  Subjective: No reported N/V overnight.  Patient denies any headaches, visual symptoms, RUQ/epigastric pain or other concerning symptoms. Patient reports good fetal movement.  She reports no uterine contractions, no bleeding and no loss of fluid per vagina.  Vitals:  Blood pressure 134/82, pulse 89, temperature 98.7 F (37.1 C), temperature source Oral, resp. rate 17, height 5\' 3"  (1.6 m), weight 91.9 kg, last menstrual period 10/14/2020, SpO2 99 %. Patient Vitals for the past 24 hrs:  BP Temp Temp src Pulse Resp SpO2 Height Weight  05/29/21 2334 134/82 98.7 F (37.1 C) Oral 89 17 99 % -- --  05/29/21 2034 (!) 145/80 98 F (36.7 C) Oral (!) 105 18 100 % -- --  05/29/21 1716 -- -- -- -- -- -- 5\' 3"  (1.6 m) 91.9 kg  05/29/21 1700 (!) 145/78 98.6 F (37 C) -- 91 16 -- -- --  05/29/21 1605 (!) 141/85 98.3 F (36.8 C) Oral (!) 103 17 100 % -- --  05/29/21 1550 (!) 165/94 -- -- (!) 113 -- 100 % -- --  05/29/21 1541 (!) 177/97 -- -- 95 -- -- -- --  05/29/21 1535 (!) 157/95 -- -- 90 -- 100 % -- --  05/29/21 1530 (!) 162/100 -- -- 91 -- 100 % -- --  05/29/21 1516 (!) 163/105 99.5 F (37.5 C) Oral (!) 107 15 -- -- --    Physical Examination: CONSTITUTIONAL: Well-developed, well-nourished female in no acute distress.  NEUROLOGIC: Alert and oriented to person, place, and time. No cranial nerve deficit noted. PSYCHIATRIC: Normal mood and affect. Normal behavior. Normal judgment and thought content. CARDIOVASCULAR: Normal heart rate noted, regular rhythm RESPIRATORY: Effort and breath sounds normal, no problems with respiration noted MUSCULOSKELETAL: Normal range of motion. No edema  and no tenderness. 2+ distal pulses. ABDOMEN: Soft, nontender, nondistended, gravid. CERVIX:  Deferred  Fetal monitoring: FHR: 155 bpm, Variability: moderate, Accelerations: Present, Decelerations: Absent  Uterine activity: None  Results for orders placed or performed during the hospital encounter of 05/29/21 (from the past 48 hour(s))  Comprehensive metabolic panel     Status: Abnormal   Collection Time: 05/29/21  3:14 PM  Result Value Ref Range   Sodium 136 135 - 145 mmol/L   Potassium 3.8 3.5 - 5.1 mmol/L   Chloride 104 98 - 111 mmol/L   CO2 23 22 - 32 mmol/L   Glucose, Bld 98 70 - 99 mg/dL    Comment: Glucose reference range applies only to samples taken after fasting for at least 8 hours.   BUN <5 (L) 6 - 20 mg/dL   Creatinine, Ser 05/31/21 0.44 - 1.00 mg/dL   Calcium 8.7 (L) 8.9 - 10.3 mg/dL   Total Protein 6.2 (L) 6.5 - 8.1 g/dL   Albumin 2.7 (L) 3.5 - 5.0 g/dL   AST 24 15 - 41 U/L   ALT 12 0 - 44 U/L   Alkaline Phosphatase 173 (H) 38 - 126 U/L   Total Bilirubin 0.3 0.3 - 1.2 mg/dL   GFR, Estimated 05/31/21 2.70 mL/min    Comment: (NOTE) Calculated using the CKD-EPI Creatinine Equation (2021)    Anion gap 9 5 - 15    Comment: Performed at  [facility], [address], [place], [place] [address]  CBC     Status: Abnormal   Collection Time: 05/29/21  3:14 PM  Result Value Ref Range   WBC 5.3 4.0 - 10.5 K/uL   RBC 3.51 (L) 3.87 - 5.11 MIL/uL   Hemoglobin 11.7 (L) 12.0 - 15.0 g/dL   HCT 52.8 (L) 41.3 - 24.4 %   MCV 96.6 80.0 - 100.0 fL   MCH 33.3 26.0 - 34.0 pg   MCHC 34.5 30.0 - 36.0 g/dL   RDW 01.0 27.2 - 53.6 %   Platelets 277 150 - 400 K/uL   nRBC 0.0 0.0 - 0.2 %    Comment: Performed at [facility], [address], [place], [place] [address]  Type and screen [facility]     Status: None   Collection Time: 05/29/21  3:20 PM  Result Value Ref Range   ABO/RH(D) O POS    Antibody Screen NEG    Sample Expiration       06/01/2021,2359 Performed at [facility], [address] [place], [place] [address]   Protein / creatinine ratio, urine     Status: Abnormal   Collection Time: 05/29/21  3:30 PM  Result Value Ref Range   Creatinine, Urine 103.57 mg/dL   Total Protein, Urine 142 mg/dL    Comment: NO NORMAL RANGE ESTABLISHED FOR THIS TEST   Protein Creatinine Ratio 1.37 (H) 0.00 - 0.15 mg/mg[Cre]    Comment: Performed at [facility], [address], [place], [place] [address]  Urinalysis, Routine w reflex microscopic Urine, Clean Catch     Status: Abnormal   Collection Time: 05/29/21  3:30 PM  Result Value Ref Range   Color, Urine YELLOW YELLOW   APPearance HAZY (A) CLEAR   Specific Gravity, Urine 1.010 1.005 - 1.030   pH 7.0 5.0 - 8.0   Glucose, UA NEGATIVE NEGATIVE mg/dL   Hgb urine dipstick NEGATIVE NEGATIVE   Bilirubin Urine NEGATIVE NEGATIVE   Ketones, ur 20 (A) NEGATIVE mg/dL   Protein, ur 756 (A) NEGATIVE mg/dL   Nitrite NEGATIVE NEGATIVE   Leukocytes,Ua NEGATIVE NEGATIVE   RBC / HPF 0-5 0 - 5 RBC/hpf   WBC, UA 6-10 0 - 5 WBC/hpf   Bacteria, UA NONE SEEN NONE SEEN   Squamous Epithelial / LPF 0-5 0 - 5   Mucus PRESENT     Comment: Performed at [facility], [address], [place], [place] [address]  Resp Panel by RT-PCR (Flu A&B, Covid) Nasopharyngeal Swab     Status: None   Collection Time: 05/29/21  4:00 PM   Specimen: Nasopharyngeal Swab; Nasopharyngeal(NP) swabs in vial transport medium  Result Value Ref Range   SARS Coronavirus 2 by RT PCR NEGATIVE NEGATIVE    Comment: (NOTE) SARS-CoV-2 target nucleic acids are NOT DETECTED.  The SARS-CoV-2 RNA is generally detectable in upper respiratory specimens during the acute phase of infection. The lowest concentration of SARS-CoV-2 viral copies this assay can detect is 138 copies/mL. A negative result does not preclude SARS-Cov-2 infection and should not be used as the sole basis for treatment or other patient  management decisions. A negative result may occur with  improper specimen collection/handling, submission of specimen other than nasopharyngeal swab, presence of viral mutation(s) within the areas targeted by this assay, and inadequate number of viral copies(<138 copies/mL). A negative result must be combined with clinical observations, patient history, and epidemiological  information. The expected result is Negative.  Fact Sheet for Patients:  BloggerCourse.com  Fact Sheet for Healthcare Providers:  SeriousBroker.it  This test is no t yet approved or cleared by the [place] FDA and  has been authorized for detection and/or diagnosis of SARS-CoV-2 by FDA under an Emergency Use Authorization (EUA). This EUA will remain  in effect (meaning this test can be used) for the duration of the COVID-19 declaration under Section 564(b)(1) of the Act, 21 U.S.C.section 360bbb-3(b)(1), unless the authorization is terminated  or revoked sooner.       Influenza A by PCR NEGATIVE NEGATIVE   Influenza B by PCR NEGATIVE NEGATIVE    Comment: (NOTE) The Xpert Xpress SARS-CoV-2/FLU/RSV plus assay is intended as an aid in the diagnosis of influenza from Nasopharyngeal swab specimens and should not be used as a sole basis for treatment. Nasal washings and aspirates are unacceptable for Xpert Xpress SARS-CoV-2/FLU/RSV testing.  Fact Sheet for Patients: BloggerCourse.com  Fact Sheet for Healthcare Providers: SeriousBroker.it  This test is not yet approved or cleared by the [place] FDA and has been authorized for detection and/or diagnosis of SARS-CoV-2 by FDA under an Emergency Use Authorization (EUA). This EUA will remain in effect (meaning this test can be used) for the duration of the COVID-19 declaration under Section 564(b)(1) of the Act, 21 U.S.C. section 360bbb-3(b)(1), unless the  authorization is terminated or revoked.  Performed at [facility], [address] [place], [place] [address]   Glucose, capillary     Status: Abnormal   Collection Time: 05/29/21  8:36 PM  Result Value Ref Range   Glucose-Capillary 199 (H) 70 - 99 mg/dL    Comment: Glucose reference range applies only to samples taken after fasting for at least 8 hours.  Comprehensive metabolic panel     Status: Abnormal   Collection Time: 05/30/21  3:59 AM  Result Value Ref Range   Sodium 133 (L) 135 - 145 mmol/L   Potassium 3.4 (L) 3.5 - 5.1 mmol/L   Chloride 105 98 - 111 mmol/L   CO2 22 22 - 32 mmol/L   Glucose, Bld 135 (H) 70 - 99 mg/dL    Comment: Glucose reference range applies only to samples taken after fasting for at least 8 hours.   BUN <5 (L) 6 - 20 mg/dL   Creatinine, Ser 3.29 0.44 - 1.00 mg/dL   Calcium 8.3 (L) 8.9 - 10.3 mg/dL   Total Protein 5.4 (L) 6.5 - 8.1 g/dL   Albumin 2.3 (L) 3.5 - 5.0 g/dL   AST 21 15 - 41 U/L   ALT 13 0 - 44 U/L   Alkaline Phosphatase 142 (H) 38 - 126 U/L   Total Bilirubin 0.5 0.3 - 1.2 mg/dL   GFR, Estimated >92 >42 mL/min    Comment: (NOTE) Calculated using the CKD-EPI Creatinine Equation (2021)    Anion gap 6 5 - 15    Comment: Performed at [facility], [address], [place], [place] [address]  CBC     Status: Abnormal   Collection Time: 05/30/21  3:59 AM  Result Value Ref Range   WBC 6.1 4.0 - 10.5 K/uL   RBC 3.01 (L) 3.87 - 5.11 MIL/uL   Hemoglobin 9.8 (L) 12.0 - 15.0 g/dL   HCT 96.2 (L) 22.9 - 79.8 %   MCV 95.7 80.0 - 100.0 fL   MCH 32.6 26.0 - 34.0 pg   MCHC 34.0 30.0 - 36.0 g/dL  RDW 12.6 11.5 - 15.5 %   Platelets 239 150 - 400 K/uL   nRBC 0.0 0.0 - 0.2 %    Comment: Performed at [facility], [address] [place], [place] [address]    No results found.  Current scheduled medications  aspirin EC  81 mg Oral Daily   docusate sodium  100 mg Oral Daily   famotidine  10 mg Oral BID   insulin aspart  0-16  Units Subcutaneous TID PC   insulin glargine-yfgn  18 Units Subcutaneous BID AC & HS   labetalol  200 mg Oral BID   NIFEdipine  60 mg Oral BID   potassium chloride  20 mEq Oral BID   prenatal multivitamin  1 tablet Oral Q1200   scopolamine  1 patch Transdermal Q72H    I have reviewed the patient's current medications.  ASSESSMENT: Principal Problem:   Chronic hypertension with superimposed pre-eclampsia Active Problems:   Supervision of high risk pregnancy, antepartum   Type 2 diabetes mellitus complicating pregnancy in second trimester, antepartum   [redacted] weeks gestation of pregnancy   Anemia in pregnancy, third trimester   PLAN: Continue BP management, on procardia XL 60 mg po bid and Labetalol 200 mg po bid Continue antiemetics as needed Continue insulin for T2DM, increased Lantus to 18 bid. Continue to titrate as needed. Alos follow up DM coordinator recommendations, emphasize diabetic diet.  Oral potassium repletion ordered for K 3.4 this morning, will follow up tomorrow Counseled patient about IV iron for anemia, she agreed to this. Venofer ordered. Category 1 FHR tracing, continue NST bid. Continue routine antenatal care, may consider discharge once tolerating orlal intake and able to keep medications down.   Jaynie Collins, MD, FACOG Obstetrician & Gynecologist, [facility] for [employer], [name]

## 2021-05-31 ENCOUNTER — Ambulatory Visit: Payer: 59

## 2021-05-31 ENCOUNTER — Inpatient Hospital Stay (HOSPITAL_BASED_OUTPATIENT_CLINIC_OR_DEPARTMENT_OTHER): Payer: 59

## 2021-05-31 DIAGNOSIS — E119 Type 2 diabetes mellitus without complications: Secondary | ICD-10-CM | POA: Diagnosis not present

## 2021-05-31 DIAGNOSIS — Z3A31 31 weeks gestation of pregnancy: Secondary | ICD-10-CM | POA: Diagnosis not present

## 2021-05-31 DIAGNOSIS — O24313 Unspecified pre-existing diabetes mellitus in pregnancy, third trimester: Secondary | ICD-10-CM

## 2021-05-31 DIAGNOSIS — O10013 Pre-existing essential hypertension complicating pregnancy, third trimester: Secondary | ICD-10-CM

## 2021-05-31 DIAGNOSIS — O119 Pre-existing hypertension with pre-eclampsia, unspecified trimester: Secondary | ICD-10-CM | POA: Diagnosis not present

## 2021-05-31 LAB — COMPREHENSIVE METABOLIC PANEL
ALT: 11 U/L (ref 0–44)
AST: 17 U/L (ref 15–41)
Albumin: 2.1 g/dL — ABNORMAL LOW (ref 3.5–5.0)
Alkaline Phosphatase: 132 U/L — ABNORMAL HIGH (ref 38–126)
Anion gap: 6 (ref 5–15)
BUN: <5 mg/dL — ABNORMAL LOW (ref 6–20)
CO2: 22 mmol/L (ref 22–32)
Calcium: 8.6 mg/dL — ABNORMAL LOW (ref 8.9–10.3)
Chloride: 107 mmol/L (ref 98–111)
Creatinine, Ser: 0.93 mg/dL (ref 0.44–1.00)
GFR, Estimated: >60 mL/min (ref >60)
Glucose, Bld: 91 mg/dL (ref 70–99)
Potassium: 3.5 mmol/L (ref 3.5–5.1)
Sodium: 135 mmol/L (ref 135–145)
Total Bilirubin: 0.2 mg/dL — ABNORMAL LOW (ref 0.3–1.2)
Total Protein: 5.1 g/dL — ABNORMAL LOW (ref 6.5–8.1)

## 2021-05-31 LAB — GLUCOSE, CAPILLARY
Glucose-Capillary: 79 mg/dL (ref 70–99)
Glucose-Capillary: 121 mg/dL — ABNORMAL HIGH (ref 70–99)
Glucose-Capillary: 124 mg/dL — ABNORMAL HIGH (ref 70–99)
Glucose-Capillary: 102 mg/dL — ABNORMAL HIGH (ref 70–99)

## 2021-05-31 NOTE — Progress Notes (Signed)
Patient ID: Misty [name], female   DOB: June 22, 1991, 30 y.o.   MRN: [id number]  FACULTY PRACTICE ANTEPARTUM(COMPREHENSIVE) NOTE  Misty [name] is a 30 y.o. G2P0010 at [redacted]w[redacted]d by early ultrasound who is admitted for management of chronic hypertension.   Fetal presentation is cephalic. Length of Stay:  2  Days  Subjective:  Patient reports the fetal movement as active. Patient reports uterine contraction  activity as none. Patient reports  vaginal bleeding as none. Patient describes fluid per vagina as None.  Vitals:  Blood pressure 131/73, pulse 69, temperature 97.9 F (36.6 C), temperature source Oral, resp. rate 18, height 5\' 3"  (1.6 m), weight 91.9 kg, last menstrual period 10/14/2020, SpO2 98 %. Physical Examination:  General appearance - alert, well appearing, and in no distress Heart - normal rate and regular rhythm Abdomen - soft, nontender, nondistended Fundal Height:  size equals dates Cervical Exam: Not evaluated. . Extremities: extremities normal, atraumatic, no cyanosis or edema and Homans sign is negative, no sign of DVT with DTRs 2+ bilaterally Membranes:intact  Fetal Monitoring:   Fetal Heart Rate A   Mode External filed at 05/31/2021 1113  Baseline Rate (A) 155 bpm filed at 05/31/2021 1113  Variability 6-25 BPM filed at 05/31/2021 1113  Accelerations 10 x 10 filed at 05/31/2021 1113  Decelerations None filed at 05/31/2021 1113    Labs:  Results for orders placed or performed during the hospital encounter of 05/29/21 (from the past 24 hour(s))  Glucose, capillary   Collection Time: 05/30/21  9:11 PM  Result Value Ref Range   Glucose-Capillary 122 (H) 70 - 99 mg/dL  Glucose, capillary   Collection Time: 05/30/21 11:08 PM  Result Value Ref Range   Glucose-Capillary 98 70 - 99 mg/dL  Glucose, capillary   Collection Time: 05/31/21  5:09 AM  Result Value Ref Range   Glucose-Capillary 79 70 - 99 mg/dL  Comprehensive metabolic panel   Collection Time:  05/31/21  6:16 AM  Result Value Ref Range   Sodium 135 135 - 145 mmol/L   Potassium 3.5 3.5 - 5.1 mmol/L   Chloride 107 98 - 111 mmol/L   CO2 22 22 - 32 mmol/L   Glucose, Bld 91 70 - 99 mg/dL   BUN <5 (L) 6 - 20 mg/dL   Creatinine, Ser 06/02/21 0.44 - 1.00 mg/dL   Calcium 8.6 (L) 8.9 - 10.3 mg/dL   Total Protein 5.1 (L) 6.5 - 8.1 g/dL   Albumin 2.1 (L) 3.5 - 5.0 g/dL   AST 17 15 - 41 U/L   ALT 11 0 - 44 U/L   Alkaline Phosphatase 132 (H) 38 - 126 U/L   Total Bilirubin 0.2 (L) 0.3 - 1.2 mg/dL   GFR, Estimated 5.78 >46 mL/min   Anion gap 6 5 - 15  Glucose, capillary   Collection Time: 05/31/21 10:03 AM  Result Value Ref Range   Glucose-Capillary 121 (H) 70 - 99 mg/dL   Comment 1 Notify RN    Comment 2 Document in Chart   Glucose, capillary   Collection Time: 05/31/21  1:27 PM  Result Value Ref Range   Glucose-Capillary 124 (H) 70 - 99 mg/dL   Comment 1 Notify RN    Comment 2 Document in Chart    ----------------------------------------------------------------------  OBSTETRICS REPORT                       (Signed Final 05/31/2021 01:46 pm) ---------------------------------------------------------------------- Patient Info  ID #:  [id number]                          D.O.B.:  09/16/1990 (30 yrs)  Name:       Misty [name]                Visit Date: 05/31/2021 09:07 am ---------------------------------------------------------------------- Performed By  Attending:        Lin Landsman      Ref. Address:     Faculty                    MD  Performed By:     Sandi Mealy        [facility]  Referred By:      Catalina Antigua MD ---------------------------------------------------------------------- Orders  #  Description                           Code        Ordered By  1  [place] MFM OB FOLLOW UP                   53664.40    PEGGY CONSTANT  2  [place] MFM FETAL BPP                       34742.5     PEGGY CONSTANT     W/NONSTRESS ----------------------------------------------------------------------  #  Order #                     Accession #                Episode #  1  [id number]                   [id number]                 [id number]  2  [id number]                   [id number]                 [id number] ---------------------------------------------------------------------- Indications  Pre-existing diabetes, type 2, in pregnancy,   O24.113  third trimester  Hypertension - Chronic/Pre-existing            O10.019  Abnormal biochemical finding on antenatal      O28.1  screening of mother AFP 4.36  [redacted] weeks gestation of pregnancy                Z3A.31  Encounter for other antenatal screening        Z36.2  follow-up  LR NIPS, Neg Horizon ---------------------------------------------------------------------- Fetal Evaluation  Num Of Fetuses:         1  Fetal Heart Rate(bpm):  152  Cardiac Activity:       Observed  Presentation:           Cephalic  Placenta:               Anterior  P. Cord Insertion:      Visualized  Amniotic Fluid  AFI FV:      Within normal limits  AFI Sum(cm)     %Tile       Largest Pocket(cm)  12.5            35          4.4  RUQ(cm)       RLQ(cm)       LUQ(cm)        LLQ(cm)  4.4           3.4           3              1.7 ---------------------------------------------------------------------- Biophysical Evaluation  Amniotic F.V:   Within normal limits       F. Tone:        Observed  F. Movement:    Observed                   Score:          8/8  F. Breathing:   Observed ---------------------------------------------------------------------- Biometry  BPD:      77.1  mm     G. Age:  31w 0d         32  %    CI:        73.16   %    70 - 86                                                          FL/HC:      21.6   %    19.3 - 21.3  HC:      286.5  mm     G. Age:  31w 3d         22  %    HC/AC:      1.02        0.96 - 1.17  AC:       281.5  mm     G. Age:  32w 1d         77  %    FL/BPD:     80.3   %    71 - 87  FL:       61.9  mm     G. Age:  32w 1d         63  %    FL/AC:      22.0   %    20 - 24  HUM:      52.6  mm     G. Age:  30w 5d         42  %  Est. FW:    1877  gm      4 lb 2 oz     67  % ---------------------------------------------------------------------- OB History  Gravidity:    2          SAB:   1  Living:       0 ---------------------------------------------------------------------- Gestational Age  LMP:           32w 5d        Date:  10/14/20  EDD:   07/21/21  U/S Today:     31w 5d                                        EDD:   07/28/21  Best:          31w 1d     Det. ByMarcella Dubs         EDD:   08/01/21                                      (12/05/20) ---------------------------------------------------------------------- Anatomy  Cranium:               Appears normal         Aortic Arch:            Previously seen  Cavum:                 Previously seen        Ductal Arch:            Previously seen  Ventricles:            Previously seen        Diaphragm:              Appears normal  Choroid Plexus:        Previously seen        Stomach:                Appears normal, left                                                                        sided  Cerebellum:            Previously seen        Abdomen:                Appears normal  Posterior Fossa:       Previously seen        Abdominal Wall:         Previously seen  Nuchal Fold:           Not applicable (>20    Cord Vessels:           Previously seen                         wks GA)  Face:                  Orbits and profile     Kidneys:                Appear normal                         previously seen  Lips:                  Previously seen        Bladder:  Appears normal  Thoracic:              Previously seen        Spine:                  Previously seen  Heart:                 Appears normal          Upper Extremities:      Previously seen                         (4CH, axis, and                         situs)  RVOT:                  Appears normal         Lower Extremities:      Previously seen  LVOT:                  Appears normal  Other:  Fetus appears to be female. Heels and 5th digit previously visualized.          VC not well seen, 3VV and 3VTV previously visualized. ---------------------------------------------------------------------- Impression  Ms. Wiens is admitted for chronic hypertension, type 2 DM  with known elevate AFP.  Positive interval growth with measurement consistent with  dates.  Good fetal movement and amniotic fluid volume  Biophysical profile 8/8 ---------------------------------------------------------------------- Recommendations  Continue daily NST  Weekly AFI  Management per inpatient provider. ----------------------------------------------------------------------               Lin Landsman, MD Electronically Signed Final Report   05/31/2021 01:46 pm ----------------------------------------------------------------------  Medications:  Scheduled  aspirin EC  81 mg Oral Daily   docusate sodium  100 mg Oral Daily   famotidine  10 mg Oral BID   insulin aspart  0-16 Units Subcutaneous TID PC   insulin glargine-yfgn  18 Units Subcutaneous BID AC & HS   labetalol  200 mg Oral BID   NIFEdipine  60 mg Oral BID   potassium chloride  20 mEq Oral BID   prenatal multivitamin  1 tablet Oral Q1200   scopolamine  1 patch Transdermal Q72H   sodium chloride flush  3 mL Intravenous Q12H   I have reviewed the patient's current medications.  ASSESSMENT: Patient Active Problem List   Diagnosis Date Noted   [redacted] weeks gestation of pregnancy 05/30/2021   Anemia in pregnancy, third trimester 05/30/2021   Chronic hypertension with superimposed pre-eclampsia 05/26/2021   Marijuana use 05/24/2021   Abnormal antenatal AFP screen 02/17/2021   Type 2 diabetes  mellitus complicating pregnancy in second trimester, antepartum 01/03/2021   Supervision of high risk pregnancy, antepartum 12/27/2020   Chronic hypertension affecting pregnancy 09/2013    PLAN: Consider discharge if BP well control and plan for MFM f/u can be established  Scheryl Darter 05/31/2021,4:18 PM

## 2021-06-01 DIAGNOSIS — O119 Pre-existing hypertension with pre-eclampsia, unspecified trimester: Secondary | ICD-10-CM | POA: Diagnosis not present

## 2021-06-01 LAB — GLUCOSE, CAPILLARY
Glucose-Capillary: 102 mg/dL — ABNORMAL HIGH (ref 70–99)
Glucose-Capillary: 110 mg/dL — ABNORMAL HIGH (ref 70–99)
Glucose-Capillary: 134 mg/dL — ABNORMAL HIGH (ref 70–99)
Glucose-Capillary: 64 mg/dL — ABNORMAL LOW (ref 70–99)
Glucose-Capillary: 95 mg/dL (ref 70–99)

## 2021-06-01 NOTE — Progress Notes (Signed)
Patient ID: Misty [name], female   DOB: [birth year], 30 y.o.   MRN: [id number] FACULTY PRACTICE ANTEPARTUM(COMPREHENSIVE) NOTE  Misty [name] is a 30 y.o. G2P0010 at [redacted]w[redacted]d by early ultrasound who is admitted for [facility] exacerbation.   Fetal presentation is cephalic. Length of Stay:  3  Days  Subjective: Feels well Patient reports the fetal movement as active. Patient reports uterine contraction  activity as none. Patient reports  vaginal bleeding as none. Patient describes fluid per vagina as None.  Vitals:  Blood pressure 133/82, pulse 77, temperature 98.6 F (37 C), temperature source Oral, resp. rate 18, height 5\' 3"  (1.6 m), weight 91.9 kg, last menstrual period 10/14/2020, SpO2 100 %. Physical Examination:  General appearance - alert, well appearing, and in no distress Heart - normal rate and regular rhythm Abdomen - soft, nontender, nondistended Fundal Height:  size equals dates Cervical Exam: Not evaluated. . Extremities: extremities normal, atraumatic, no cyanosis or edema and Homans sign is negative, no sign of DVT Membranes:intact  Fetal Monitoring:   Fetal Heart Rate A   Mode External filed at 06/01/2021 1004  Baseline Rate (A) 150 bpm filed at 06/01/2021 1004  Variability 6-25 BPM filed at 06/01/2021 1004  Accelerations 10 x 10 filed at 06/01/2021 1004  Decelerations None filed at 06/01/2021 1004    Labs:  Results for orders placed or performed during the hospital encounter of 05/29/21 (from the past 24 hour(s))  Glucose, capillary   Collection Time: 05/31/21  1:27 PM  Result Value Ref Range   Glucose-Capillary 124 (H) 70 - 99 mg/dL   Comment 1 Notify RN    Comment 2 Document in Chart   Glucose, capillary   Collection Time: 05/31/21  7:16 PM  Result Value Ref Range   Glucose-Capillary 102 (H) 70 - 99 mg/dL  Glucose, capillary   Collection Time: 06/01/21 12:02 AM  Result Value Ref Range   Glucose-Capillary 95 70 - 99 mg/dL  Glucose, capillary   Collection  Time: 06/01/21  5:30 AM  Result Value Ref Range   Glucose-Capillary 64 (L) 70 - 99 mg/dL  Glucose, capillary   Collection Time: 06/01/21  9:30 AM  Result Value Ref Range   Glucose-Capillary 134 (H) 70 - 99 mg/dL   Comment 1 Notify RN    Comment 2 Document in Chart      Medications:  Scheduled  aspirin EC  81 mg Oral Daily   docusate sodium  100 mg Oral Daily   famotidine  10 mg Oral BID   insulin aspart  0-16 Units Subcutaneous TID PC   insulin glargine-yfgn  18 Units Subcutaneous BID AC & HS   labetalol  200 mg Oral BID   NIFEdipine  60 mg Oral BID   prenatal multivitamin  1 tablet Oral Q1200   scopolamine  1 patch Transdermal Q72H   sodium chloride flush  3 mL Intravenous Q12H   I have reviewed the patient's current medications.  ASSESSMENT: Patient Active Problem List   Diagnosis Date Noted   [redacted] weeks gestation of pregnancy 05/30/2021   Anemia in pregnancy, third trimester 05/30/2021   Chronic hypertension with superimposed pre-eclampsia 05/26/2021   Marijuana use 05/24/2021   Abnormal antenatal AFP screen 02/17/2021   Type 2 diabetes mellitus complicating pregnancy in second trimester, antepartum 01/03/2021   Supervision of high risk pregnancy, antepartum 12/27/2020   Chronic hypertension affecting pregnancy 09/2013    PLAN: If BP is well controlled arrange for OP f/u  10/2013 06/01/2021,11:44  AM

## 2021-06-01 NOTE — Progress Notes (Signed)
Inpatient Diabetes Program Recommendations  AACE/ADA: New Consensus Statement on Inpatient Glycemic Control (2015)  Target Ranges:  Prepandial:   less than 140 mg/dL      Peak postprandial:   less than 180 mg/dL (1-2 hours)      Critically ill patients:  140 - 180 mg/dL   Lab Results  Component Value Date   GLUCAP 64 (L) 06/01/2021   HGBA1C 7.0 (A) 03/09/2021    Review of Glycemic Control Results for Misty [name], Misty [name] (MRN [id number]) as of 06/01/2021 09:11  Ref. Range 05/31/2021 13:27 05/31/2021 19:16 06/01/2021 00:02 06/01/2021 05:30  Glucose-Capillary Latest Ref Range: 70 - 99 mg/dL 324 (H) 401 (H) 95 64 (L)  Diabetes history: Type 2 DM Outpatient Diabetes medications: Novolog- Omnipod, Lantus 16 units BID (for backup) Current orders for Inpatient glycemic control: Novolog 0-16 units TID, Semglee 18 units BID   Inpatient Diabetes Program Recommendations:    Noted mild low this AM of 64 mg/dL. May want to consider decreasing Semglee to 16 units BID.   Thanks, Lujean Rave, MSN, RNC-OB Diabetes Coordinator [phone] (8a-5p)

## 2021-06-02 ENCOUNTER — Other Ambulatory Visit (HOSPITAL_COMMUNITY): Payer: Self-pay

## 2021-06-02 ENCOUNTER — Encounter: Payer: Self-pay | Admitting: Radiology

## 2021-06-02 LAB — GLUCOSE, CAPILLARY
Glucose-Capillary: 82 mg/dL (ref 70–99)
Glucose-Capillary: 94 mg/dL (ref 70–99)
Glucose-Capillary: 98 mg/dL (ref 70–99)

## 2021-06-02 MED ORDER — INSULIN GLARGINE 100 UNIT/ML SOLOSTAR PEN
18.0000 [IU] | PEN_INJECTOR | Freq: Two times a day (BID) | SUBCUTANEOUS | 11 refills | Status: DC
  Filled 2021-06-02: qty 15, 42d supply, fill #0

## 2021-06-02 MED ORDER — NIFEDIPINE ER 60 MG PO TB24
60.0000 mg | ORAL_TABLET | Freq: Two times a day (BID) | ORAL | 0 refills | Status: DC
  Filled 2021-06-02: qty 60, 30d supply, fill #0

## 2021-06-02 MED ORDER — INSULIN PEN NEEDLE 31G X 8 MM MISC
0 refills | Status: DC
  Filled 2021-06-02: qty 100, 30d supply, fill #0

## 2021-06-02 MED ORDER — LABETALOL HCL 200 MG PO TABS
200.0000 mg | ORAL_TABLET | Freq: Two times a day (BID) | ORAL | 0 refills | Status: DC
  Filled 2021-06-02: qty 60, 30d supply, fill #0

## 2021-06-02 NOTE — Progress Notes (Signed)
FACULTY PRACTICE ANTEPARTUM PROGRESS NOTE  Misty [name] is a 30 y.o. G2P0010 at [redacted]w[redacted]d who is admitted for chronic hypertension with hypertensive exacerbation..  Estimated Date of Delivery: 08/01/21 Fetal presentation is cephalic.  Length of Stay:  4 Days. Admitted 05/29/2021  Subjective: Pt seen, states she is doing well.  She denies headache, visual changes and RUQ pain. Patient reports normal fetal movement.  She denies uterine contractions, denies bleeding and leaking of fluid per vagina.  Vitals:  Blood pressure (!) 142/93, pulse 78, temperature 97.7 F (36.5 C), temperature source Oral, resp. rate 18, height 5\' 3"  (1.6 m), weight 91.9 kg, last menstrual period 10/14/2020, SpO2 99 %. Physical Examination: CONSTITUTIONAL: Well-developed, well-nourished female in no acute distress.  HENT:  Normocephalic, atraumatic, External right and left ear normal. Oropharynx is clear and moist EYES: Conjunctivae and EOM are normal. Pupils are equal, round, and reactive to light. No scleral icterus.  NECK: Normal range of motion, supple, no masses. SKIN: Skin is warm and dry. No rash noted. Not diaphoretic. No erythema. No pallor. NEUROLGIC: Alert and oriented to person, place, and time. Normal reflexes, muscle tone coordination. No cranial nerve deficit noted. PSYCHIATRIC: Normal mood and affect. Normal behavior. Normal judgment and thought content. CARDIOVASCULAR: Normal heart rate noted, regular rhythm RESPIRATORY: Effort and breath sounds normal, no problems with respiration noted MUSCULOSKELETAL: Normal range of motion. No edema and no tenderness. ABDOMEN: Soft, nontender, nondistended, gravid. CERVIX: deferred  Fetal monitoring: FHR: 150s bpm, Variability: moderate, Accelerations: Present, Decelerations: Absent , category 1 strip Uterine activity: none   Results for orders placed or performed during the hospital encounter of 05/29/21 (from the past 48 hour(s))  Glucose, capillary      Status: Abnormal   Collection Time: 05/31/21 10:03 AM  Result Value Ref Range   Glucose-Capillary 121 (H) 70 - 99 mg/dL    Comment: Glucose reference range applies only to samples taken after fasting for at least 8 hours.   Comment 1 Notify RN    Comment 2 Document in Chart   Glucose, capillary     Status: Abnormal   Collection Time: 05/31/21  1:27 PM  Result Value Ref Range   Glucose-Capillary 124 (H) 70 - 99 mg/dL    Comment: Glucose reference range applies only to samples taken after fasting for at least 8 hours.   Comment 1 Notify RN    Comment 2 Document in Chart   Glucose, capillary     Status: Abnormal   Collection Time: 05/31/21  7:16 PM  Result Value Ref Range   Glucose-Capillary 102 (H) 70 - 99 mg/dL    Comment: Glucose reference range applies only to samples taken after fasting for at least 8 hours.  Glucose, capillary     Status: None   Collection Time: 06/01/21 12:02 AM  Result Value Ref Range   Glucose-Capillary 95 70 - 99 mg/dL    Comment: Glucose reference range applies only to samples taken after fasting for at least 8 hours.  Glucose, capillary     Status: Abnormal   Collection Time: 06/01/21  5:30 AM  Result Value Ref Range   Glucose-Capillary 64 (L) 70 - 99 mg/dL    Comment: Glucose reference range applies only to samples taken after fasting for at least 8 hours.  Glucose, capillary     Status: Abnormal   Collection Time: 06/01/21  9:30 AM  Result Value Ref Range   Glucose-Capillary 134 (H) 70 - 99 mg/dL    Comment: Glucose  reference range applies only to samples taken after fasting for at least 8 hours.   Comment 1 Notify RN    Comment 2 Document in Chart   Glucose, capillary     Status: Abnormal   Collection Time: 06/01/21  1:28 PM  Result Value Ref Range   Glucose-Capillary 102 (H) 70 - 99 mg/dL    Comment: Glucose reference range applies only to samples taken after fasting for at least 8 hours.   Comment 1 Notify RN    Comment 2 Document in Chart    Glucose, capillary     Status: Abnormal   Collection Time: 06/01/21  7:48 PM  Result Value Ref Range   Glucose-Capillary 110 (H) 70 - 99 mg/dL    Comment: Glucose reference range applies only to samples taken after fasting for at least 8 hours.  Glucose, capillary     Status: None   Collection Time: 06/02/21 12:03 AM  Result Value Ref Range   Glucose-Capillary 82 70 - 99 mg/dL    Comment: Glucose reference range applies only to samples taken after fasting for at least 8 hours.    I have reviewed the patient's current medications.  ASSESSMENT: Principal Problem:   Chronic hypertension with superimposed pre-eclampsia Active Problems:   Supervision of high risk pregnancy, antepartum   Type 2 diabetes mellitus complicating pregnancy in second trimester, antepartum   [redacted] weeks gestation of pregnancy   Anemia in pregnancy, third trimester   PLAN: BP appears well controlled with current regimen of procardia XL 60mg  and current labetalol.   Anticipate d/c to home on new BP regimen. Will need follow up next week with weekly BPP/AFI   Continue routine antenatal care.   , MD [facility] Faculty Attending, [facility] 06/02/2021 7:09 AM

## 2021-06-02 NOTE — Discharge Summary (Addendum)
Antenatal Physician Discharge Summary  Patient ID: COREAN YOSHIMURA MRN: [id number] DOB/AGE: 1990-11-22 30 y.o.  Admit date: 05/29/2021 Discharge date: 06/02/2021  Admission Diagnoses: chronic hypertension with exacerbation  Discharge Diagnoses: chronic hypertension with exacerbation  Prenatal Procedures: ultrasound  Consults: Neonatology, Maternal Fetal Medicine  Hospital Course:  MICAH GALENO is a 30 y.o. G2P0010 with IUP at [redacted]w[redacted]d admitted for nausea along with severe range blood pressures.  Pt was admitted for BP control with procardia XL 60 mg BID and labetalol 200 mg po BID.  BP much improved on new regimen    She was deemed stable for discharge to home with outpatient follow up.  Discharge Exam: Temp:  [97.7 F (36.5 C)-98.6 F (37 C)] 97.7 F (36.5 C) (09/30 0800) Pulse Rate:  [69-85] 82 (09/30 0800) Resp:  [18] 18 (09/30 0800) BP: (121-143)/(69-93) 143/80 (09/30 0740) SpO2:  [97 %-100 %] 100 % (09/30 0800) Physical Examination: CONSTITUTIONAL: Well-developed, well-nourished female in no acute distress.  HENT:  Normocephalic, atraumatic, External right and left ear normal. Oropharynx is clear and moist EYES: Conjunctivae and EOM are normal. Pupils are equal, round, and reactive to light. No scleral icterus.  NECK: Normal range of motion, supple, no masses SKIN: Skin is warm and dry. No rash noted. Not diaphoretic. No erythema. No pallor. Bear: Alert and oriented to person, place, and time. Normal reflexes, muscle tone coordination. No cranial nerve deficit noted. PSYCHIATRIC: Normal mood and affect. Normal behavior. Normal judgment and thought content. CARDIOVASCULAR: Normal heart rate noted, regular rhythm RESPIRATORY: Effort and breath sounds normal, no problems with respiration noted MUSCULOSKELETAL: Normal range of motion. No edema and no tenderness. 2+ distal pulses. ABDOMEN: Soft, nontender, nondistended, gravid. CERVIX:  deferred  Significant Diagnostic  Studies:  Results for orders placed or performed during the hospital encounter of 05/29/21 (from the past 168 hour(s))  Comprehensive metabolic panel   Collection Time: 05/29/21  3:14 PM  Result Value Ref Range   Sodium 136 135 - 145 mmol/L   Potassium 3.8 3.5 - 5.1 mmol/L   Chloride 104 98 - 111 mmol/L   CO2 23 22 - 32 mmol/L   Glucose, Bld 98 70 - 99 mg/dL   BUN <5 (L) 6 - 20 mg/dL   Creatinine, Ser 0.80 0.44 - 1.00 mg/dL   Calcium 8.7 (L) 8.9 - 10.3 mg/dL   Total Protein 6.2 (L) 6.5 - 8.1 g/dL   Albumin 2.7 (L) 3.5 - 5.0 g/dL   AST 24 15 - 41 U/L   ALT 12 0 - 44 U/L   Alkaline Phosphatase 173 (H) 38 - 126 U/L   Total Bilirubin 0.3 0.3 - 1.2 mg/dL   GFR, Estimated >60 >60 mL/min   Anion gap 9 5 - 15  CBC   Collection Time: 05/29/21  3:14 PM  Result Value Ref Range   WBC 5.3 4.0 - 10.5 K/uL   RBC 3.51 (L) 3.87 - 5.11 MIL/uL   Hemoglobin 11.7 (L) 12.0 - 15.0 g/dL   HCT 33.9 (L) 36.0 - 46.0 %   MCV 96.6 80.0 - 100.0 fL   MCH 33.3 26.0 - 34.0 pg   MCHC 34.5 30.0 - 36.0 g/dL   RDW 12.6 11.5 - 15.5 %   Platelets 277 150 - 400 K/uL   nRBC 0.0 0.0 - 0.2 %  Type and screen [place]   Collection Time: 05/29/21  3:20 PM  Result Value Ref Range   ABO/RH(D) O POS    Antibody  Screen NEG    Sample Expiration      06/01/2021,2359 Performed at [facility], [place] [address] [place], [place] [address]   Protein / creatinine ratio, urine   Collection Time: 05/29/21  3:30 PM  Result Value Ref Range   Creatinine, Urine 103.57 mg/dL   Total Protein, Urine 142 mg/dL   Protein Creatinine Ratio 1.37 (H) 0.00 - 0.15 mg/mg[Cre]  Urinalysis, Routine w reflex microscopic Urine, Clean Catch   Collection Time: 05/29/21  3:30 PM  Result Value Ref Range   Color, Urine YELLOW YELLOW   APPearance HAZY (A) CLEAR   Specific Gravity, Urine 1.010 1.005 - 1.030   pH 7.0 5.0 - 8.0   Glucose, UA NEGATIVE NEGATIVE mg/dL   Hgb urine dipstick NEGATIVE NEGATIVE   Bilirubin  Urine NEGATIVE NEGATIVE   Ketones, ur 20 (A) NEGATIVE mg/dL   Protein, ur 100 (A) NEGATIVE mg/dL   Nitrite NEGATIVE NEGATIVE   Leukocytes,Ua NEGATIVE NEGATIVE   RBC / HPF 0-5 0 - 5 RBC/hpf   WBC, UA 6-10 0 - 5 WBC/hpf   Bacteria, UA NONE SEEN NONE SEEN   Squamous Epithelial / LPF 0-5 0 - 5   Mucus PRESENT   Resp Panel by RT-PCR (Flu A&B, Covid) Nasopharyngeal Swab   Collection Time: 05/29/21  4:00 PM   Specimen: Nasopharyngeal Swab; Nasopharyngeal(NP) swabs in vial transport medium  Result Value Ref Range   SARS Coronavirus 2 by RT PCR NEGATIVE NEGATIVE   Influenza A by PCR NEGATIVE NEGATIVE   Influenza B by PCR NEGATIVE NEGATIVE  Glucose, capillary   Collection Time: 05/29/21  8:36 PM  Result Value Ref Range   Glucose-Capillary 199 (H) 70 - 99 mg/dL  Comprehensive metabolic panel   Collection Time: 05/30/21  3:59 AM  Result Value Ref Range   Sodium 133 (L) 135 - 145 mmol/L   Potassium 3.4 (L) 3.5 - 5.1 mmol/L   Chloride 105 98 - 111 mmol/L   CO2 22 22 - 32 mmol/L   Glucose, Bld 135 (H) 70 - 99 mg/dL   BUN <5 (L) 6 - 20 mg/dL   Creatinine, Ser 0.85 0.44 - 1.00 mg/dL   Calcium 8.3 (L) 8.9 - 10.3 mg/dL   Total Protein 5.4 (L) 6.5 - 8.1 g/dL   Albumin 2.3 (L) 3.5 - 5.0 g/dL   AST 21 15 - 41 U/L   ALT 13 0 - 44 U/L   Alkaline Phosphatase 142 (H) 38 - 126 U/L   Total Bilirubin 0.5 0.3 - 1.2 mg/dL   GFR, Estimated >60 >60 mL/min   Anion gap 6 5 - 15  CBC   Collection Time: 05/30/21  3:59 AM  Result Value Ref Range   WBC 6.1 4.0 - 10.5 K/uL   RBC 3.01 (L) 3.87 - 5.11 MIL/uL   Hemoglobin 9.8 (L) 12.0 - 15.0 g/dL   HCT 28.8 (L) 36.0 - 46.0 %   MCV 95.7 80.0 - 100.0 fL   MCH 32.6 26.0 - 34.0 pg   MCHC 34.0 30.0 - 36.0 g/dL   RDW 12.6 11.5 - 15.5 %   Platelets 239 150 - 400 K/uL   nRBC 0.0 0.0 - 0.2 %  Glucose, capillary   Collection Time: 05/30/21 10:06 AM  Result Value Ref Range   Glucose-Capillary 129 (H) 70 - 99 mg/dL  Glucose, capillary   Collection Time: 05/30/21   9:11 PM  Result Value Ref Range   Glucose-Capillary 122 (H) 70 - 99 mg/dL  Glucose, capillary   Collection Time: 05/30/21 11:08 PM  Result Value Ref Range   Glucose-Capillary 98 70 - 99 mg/dL  Glucose, capillary   Collection Time: 05/31/21  5:09 AM  Result Value Ref Range   Glucose-Capillary 79 70 - 99 mg/dL  Comprehensive metabolic panel   Collection Time: 05/31/21  6:16 AM  Result Value Ref Range   Sodium 135 135 - 145 mmol/L   Potassium 3.5 3.5 - 5.1 mmol/L   Chloride 107 98 - 111 mmol/L   CO2 22 22 - 32 mmol/L   Glucose, Bld 91 70 - 99 mg/dL   BUN <5 (L) 6 - 20 mg/dL   Creatinine, Ser 0.93 0.44 - 1.00 mg/dL   Calcium 8.6 (L) 8.9 - 10.3 mg/dL   Total Protein 5.1 (L) 6.5 - 8.1 g/dL   Albumin 2.1 (L) 3.5 - 5.0 g/dL   AST 17 15 - 41 U/L   ALT 11 0 - 44 U/L   Alkaline Phosphatase 132 (H) 38 - 126 U/L   Total Bilirubin 0.2 (L) 0.3 - 1.2 mg/dL   GFR, Estimated >60 >60 mL/min   Anion gap 6 5 - 15  Glucose, capillary   Collection Time: 05/31/21 10:03 AM  Result Value Ref Range   Glucose-Capillary 121 (H) 70 - 99 mg/dL   Comment 1 Notify RN    Comment 2 Document in Chart   Glucose, capillary   Collection Time: 05/31/21  1:27 PM  Result Value Ref Range   Glucose-Capillary 124 (H) 70 - 99 mg/dL   Comment 1 Notify RN    Comment 2 Document in Chart   Glucose, capillary   Collection Time: 05/31/21  7:16 PM  Result Value Ref Range   Glucose-Capillary 102 (H) 70 - 99 mg/dL  Glucose, capillary   Collection Time: 06/01/21 12:02 AM  Result Value Ref Range   Glucose-Capillary 95 70 - 99 mg/dL  Glucose, capillary   Collection Time: 06/01/21  5:30 AM  Result Value Ref Range   Glucose-Capillary 64 (L) 70 - 99 mg/dL  Glucose, capillary   Collection Time: 06/01/21  9:30 AM  Result Value Ref Range   Glucose-Capillary 134 (H) 70 - 99 mg/dL   Comment 1 Notify RN    Comment 2 Document in Chart   Glucose, capillary   Collection Time: 06/01/21  1:28 PM  Result Value Ref Range    Glucose-Capillary 102 (H) 70 - 99 mg/dL   Comment 1 Notify RN    Comment 2 Document in Chart   Glucose, capillary   Collection Time: 06/01/21  7:48 PM  Result Value Ref Range   Glucose-Capillary 110 (H) 70 - 99 mg/dL  Glucose, capillary   Collection Time: 06/02/21 12:03 AM  Result Value Ref Range   Glucose-Capillary 82 70 - 99 mg/dL  Glucose, capillary   Collection Time: 06/02/21  8:00 AM  Result Value Ref Range   Glucose-Capillary 98 70 - 99 mg/dL  Glucose, capillary   Collection Time: 06/02/21  9:32 AM  Result Value Ref Range   Glucose-Capillary 94 70 - 99 mg/dL  Results for orders placed or performed during the hospital encounter of 05/26/21 (from the past 168 hour(s))  Urinalysis, Routine w reflex microscopic Urine, Clean Catch   Collection Time: 05/26/21 10:36 AM  Result Value Ref Range   Color, Urine YELLOW YELLOW   APPearance CLOUDY (A) CLEAR   Specific Gravity, Urine 1.015 1.005 - 1.030   pH 5.0 5.0 - 8.0   Glucose,  UA NEGATIVE NEGATIVE mg/dL   Hgb urine dipstick NEGATIVE NEGATIVE   Bilirubin Urine NEGATIVE NEGATIVE   Ketones, ur NEGATIVE NEGATIVE mg/dL   Protein, ur 100 (A) NEGATIVE mg/dL   Nitrite NEGATIVE NEGATIVE   Leukocytes,Ua TRACE (A) NEGATIVE   RBC / HPF 0-5 0 - 5 RBC/hpf   WBC, UA 6-10 0 - 5 WBC/hpf   Bacteria, UA NONE SEEN NONE SEEN   Squamous Epithelial / LPF 21-50 0 - 5   Mucus PRESENT   Protein / creatinine ratio, urine   Collection Time: 05/26/21 10:36 AM  Result Value Ref Range   Creatinine, Urine 169.66 mg/dL   Total Protein, Urine 242 mg/dL   Protein Creatinine Ratio 1.43 (H) 0.00 - 0.15 mg/mg[Cre]  Comprehensive metabolic panel   Collection Time: 05/26/21 10:44 AM  Result Value Ref Range   Sodium 134 (L) 135 - 145 mmol/L   Potassium 3.7 3.5 - 5.1 mmol/L   Chloride 103 98 - 111 mmol/L   CO2 23 22 - 32 mmol/L   Glucose, Bld 110 (H) 70 - 99 mg/dL   BUN <5 (L) 6 - 20 mg/dL   Creatinine, Ser 0.75 0.44 - 1.00 mg/dL   Calcium 8.9 8.9 - 10.3  mg/dL   Total Protein 6.2 (L) 6.5 - 8.1 g/dL   Albumin 2.7 (L) 3.5 - 5.0 g/dL   AST 23 15 - 41 U/L   ALT 14 0 - 44 U/L   Alkaline Phosphatase 151 (H) 38 - 126 U/L   Total Bilirubin 0.6 0.3 - 1.2 mg/dL   GFR, Estimated >60 >60 mL/min   Anion gap 8 5 - 15  CBC   Collection Time: 05/26/21 10:44 AM  Result Value Ref Range   WBC 4.6 4.0 - 10.5 K/uL   RBC 3.38 (L) 3.87 - 5.11 MIL/uL   Hemoglobin 11.3 (L) 12.0 - 15.0 g/dL   HCT 32.7 (L) 36.0 - 46.0 %   MCV 96.7 80.0 - 100.0 fL   MCH 33.4 26.0 - 34.0 pg   MCHC 34.6 30.0 - 36.0 g/dL   RDW 12.6 11.5 - 15.5 %   Platelets 265 150 - 400 K/uL   nRBC 0.0 0.0 - 0.2 %   [place] MFM FETAL BPP W/NONSTRESS  Result Date: 05/31/2021 ----------------------------------------------------------------------  OBSTETRICS REPORT                       (Signed Final 05/31/2021 01:46 pm) ---------------------------------------------------------------------- Patient Info  ID #:       [id number]                          D.O.B.:  03-29-1991 (30 yrs)  Name:       De Nurse                Visit Date: 05/31/2021 09:07 am ---------------------------------------------------------------------- Performed By  Attending:        Sander Nephew      Ref. Address:     Faculty                    MD  Performed By:     Georgie Chard        [facility]  [place]  Referred By:      Vickii Chafe                    CONSTANT MD ---------------------------------------------------------------------- Orders  #  Description                           Code        Ordered By  1  [place] MFM OB FOLLOW UP                   [id number]    [place] CONSTANT  2  [place] MFM FETAL BPP                      25427.0     [place]     W/NONSTRESS ----------------------------------------------------------------------  #  Order #                     Accession #                Episode #  1  [id number]                   [id number]                  [id number]  2  [id number]                   [id number]                 [id number] ---------------------------------------------------------------------- Indications  Pre-existing diabetes, type 2, in pregnancy,   O24.113  third trimester  Hypertension - Chronic/Pre-existing            O10.019  Abnormal biochemical finding on antenatal      O28.1  screening of mother AFP 4.36  [redacted] weeks gestation of pregnancy                Z3A.31  Encounter for other antenatal screening        Z36.2  follow-up  LR NIPS, Neg [place] ---------------------------------------------------------------------- Fetal Evaluation  Num Of Fetuses:         1  Fetal Heart Rate(bpm):  152  Cardiac Activity:       Observed  Presentation:           Cephalic  Placenta:               Anterior  P. Cord Insertion:      Visualized  Amniotic Fluid  AFI FV:      Within normal limits  AFI Sum(cm)     %Tile       Largest Pocket(cm)  12.5            35          4.4  RUQ(cm)       RLQ(cm)       LUQ(cm)        LLQ(cm)  4.4           3.4           3              1.7 ---------------------------------------------------------------------- Biophysical Evaluation  Amniotic F.V:   Within normal limits       F. Tone:        Observed  F. Movement:    Observed  Score:          8/8  F. Breathing:   Observed ---------------------------------------------------------------------- Biometry  BPD:      77.1  mm     G. Age:  31w 0d         32  %    CI:        73.16   %    70 - 86                                                          FL/HC:      21.6   %    19.3 - 21.3  HC:      286.5  mm     G. Age:  31w 3d         22  %    HC/AC:      1.02        0.96 - 1.17  AC:      281.5  mm     G. Age:  32w 1d         77  %    FL/BPD:     80.3   %    71 - 87  FL:       61.9  mm     G. Age:  32w 1d         63  %    FL/AC:      22.0   %    20 - 24  HUM:      52.6  mm     G. Age:  30w 5d         42  %  Est. FW:    1877  gm      4 lb 2 oz     67  %  ---------------------------------------------------------------------- OB History  Gravidity:    2          SAB:   1  Living:       0 ---------------------------------------------------------------------- Gestational Age  LMP:           32w 5d        Date:  10/14/20                 EDD:   07/21/21  U/S Today:     31w 5d                                        EDD:   07/28/21  Best:          31w 1d     Det. ByLoman Chroman         EDD:   08/01/21                                      (12/05/20) ---------------------------------------------------------------------- Anatomy  Cranium:               Appears normal         Aortic Arch:            Previously seen  Cavum:  Previously seen        Ductal Arch:            Previously seen  Ventricles:            Previously seen        Diaphragm:              Appears normal  Choroid Plexus:        Previously seen        Stomach:                Appears normal, left                                                                        sided  Cerebellum:            Previously seen        Abdomen:                Appears normal  Posterior Fossa:       Previously seen        Abdominal Wall:         Previously seen  Nuchal Fold:           Not applicable (>82    Cord Vessels:           Previously seen                         wks GA)  Face:                  Orbits and profile     Kidneys:                Appear normal                         previously seen  Lips:                  Previously seen        Bladder:                Appears normal  Thoracic:              Previously seen        Spine:                  Previously seen  Heart:                 Appears normal         Upper Extremities:      Previously seen                         (4CH, axis, and                         situs)  RVOT:                  Appears normal         Lower Extremities:      Previously seen  LVOT:  Appears normal  Other:  Fetus appears to be female. Heels and 5th digit  previously visualized.          VC not well seen, 3VV and 3VTV previously visualized. ---------------------------------------------------------------------- Impression  Ms. Deprey is admitted for chronic hypertension, type 2 DM  with known elevate AFP.  Positive interval growth with measurement consistent with  dates.  Good fetal movement and amniotic fluid volume  Biophysical profile 8/8 ---------------------------------------------------------------------- Recommendations  Continue daily NST  Weekly AFI  Management per inpatient provider. ----------------------------------------------------------------------               Sander Nephew, MD Electronically Signed Final Report   05/31/2021 01:46 pm ----------------------------------------------------------------------  [place] MFM OB FOLLOW UP  Result Date: 05/31/2021 ----------------------------------------------------------------------  OBSTETRICS REPORT                       (Signed Final 05/31/2021 01:46 pm) ---------------------------------------------------------------------- Patient Info  ID #:       [id number]                          D.O.B.:  [birth year] (30 yrs)  Name:       De Nurse                Visit Date: 05/31/2021 09:07 am ---------------------------------------------------------------------- Performed By  Attending:        Sander Nephew      Ref. Address:     Faculty                    MD  Performed By:     Georgie Chard        [facility]                    [place]  Referred By:      Mora Bellman MD ---------------------------------------------------------------------- Orders  #  Description                           Code        Ordered By  1  [place] MFM OB FOLLOW UP                   67672.09    [place]  2  [place] MFM FETAL BPP                      47096.2     [place]     W/NONSTRESS  ----------------------------------------------------------------------  #  Order #                     Accession #                Episode #  1  [id number]                   [id number]                 [id number]  2  [id number]  [id number]                 [id number] ---------------------------------------------------------------------- Indications  Pre-existing diabetes, type 2, in pregnancy,   O24.113  third trimester  Hypertension - Chronic/Pre-existing            O10.019  Abnormal biochemical finding on antenatal      O28.1  screening of mother AFP 4.36  [redacted] weeks gestation of pregnancy                Z3A.31  Encounter for other antenatal screening        Z36.2  follow-up  LR NIPS, Neg [place] ---------------------------------------------------------------------- Fetal Evaluation  Num Of Fetuses:         1  Fetal Heart Rate(bpm):  152  Cardiac Activity:       Observed  Presentation:           Cephalic  Placenta:               Anterior  P. Cord Insertion:      Visualized  Amniotic Fluid  AFI FV:      Within normal limits  AFI Sum(cm)     %Tile       Largest Pocket(cm)  12.5            35          4.4  RUQ(cm)       RLQ(cm)       LUQ(cm)        LLQ(cm)  4.4           3.4           3              1.7 ---------------------------------------------------------------------- Biophysical Evaluation  Amniotic F.V:   Within normal limits       F. Tone:        Observed  F. Movement:    Observed                   Score:          8/8  F. Breathing:   Observed ---------------------------------------------------------------------- Biometry  BPD:      77.1  mm     G. Age:  31w 0d         32  %    CI:        73.16   %    70 - 86                                                          FL/HC:      21.6   %    19.3 - 21.3  HC:      286.5  mm     G. Age:  31w 3d         22  %    HC/AC:      1.02        0.96 - 1.17  AC:      281.5  mm     G. Age:  32w 1d         77  %    FL/BPD:     80.3   %    71 - 87  FL:       61.9  mm      G. Age:  32w 1d         63  %    FL/AC:      22.0   %    20 - 24  HUM:      52.6  mm     G. Age:  30w 5d         42  %  Est. FW:    1877  gm      4 lb 2 oz     67  % ---------------------------------------------------------------------- OB History  Gravidity:    2          SAB:   1  Living:       0 ---------------------------------------------------------------------- Gestational Age  LMP:           32w 5d        Date:  10/14/20                 EDD:   07/21/21  U/S Today:     31w 5d                                        EDD:   07/28/21  Best:          31w 1d     Det. ByLoman Chroman         EDD:   08/01/21                                      (12/05/20) ---------------------------------------------------------------------- Anatomy  Cranium:               Appears normal         Aortic Arch:            Previously seen  Cavum:                 Previously seen        Ductal Arch:            Previously seen  Ventricles:            Previously seen        Diaphragm:              Appears normal  Choroid Plexus:        Previously seen        Stomach:                Appears normal, left                                                                        sided  Cerebellum:            Previously seen        Abdomen:                Appears normal  Posterior Fossa:       Previously seen        Abdominal Wall:         Previously  seen  Nuchal Fold:           Not applicable (>88    Cord Vessels:           Previously seen                         wks GA)  Face:                  Orbits and profile     Kidneys:                Appear normal                         previously seen  Lips:                  Previously seen        Bladder:                Appears normal  Thoracic:              Previously seen        Spine:                  Previously seen  Heart:                 Appears normal         Upper Extremities:      Previously seen                         (4CH, axis, and                         situs)  RVOT:                   Appears normal         Lower Extremities:      Previously seen  LVOT:                  Appears normal  Other:  Fetus appears to be female. Heels and 5th digit previously visualized.          VC not well seen, 3VV and 3VTV previously visualized. ---------------------------------------------------------------------- Impression  Ms. Horsman is admitted for chronic hypertension, type 2 DM  with known elevate AFP.  Positive interval growth with measurement consistent with  dates.  Good fetal movement and amniotic fluid volume  Biophysical profile 8/8 ---------------------------------------------------------------------- Recommendations  Continue daily NST  Weekly AFI  Management per inpatient provider. ----------------------------------------------------------------------               Sander Nephew, MD Electronically Signed Final Report   05/31/2021 01:46 pm ----------------------------------------------------------------------  [place] MFM OB FOLLOW UP  Result Date: 05/03/2021 ----------------------------------------------------------------------  OBSTETRICS REPORT                       (Signed Final 05/03/2021 10:35 am) ---------------------------------------------------------------------- Patient Info  ID #:       [id number]                          D.O.B.:  1991-02-09 (30 yrs)  Name:       De Nurse                Visit Date: 05/03/2021 10:21 am ---------------------------------------------------------------------- Performed  By  Attending:        Tama High MD        Ref. Address:     [address] [place]                                                             [place], [place]  Performed By:     Lacretia Leigh       [facility]                                                              [facility]  Referred By:      Clarnce Flock MD ---------------------------------------------------------------------- Orders  #  Description                           Code        Ordered By  1  [place] MFM OB FOLLOW UP                   44315.40    Sander Nephew ----------------------------------------------------------------------  #  Order #  Accession #                Episode #  1  [id number]                   [id number]                 [id number] ---------------------------------------------------------------------- Indications  [redacted] weeks gestation of pregnancy                Z3A.27  Antenatal follow-up for nonvisualized fetal    Z36.2  anatomy  Pre-existing diabetes, type 2, in pregnancy,   O24.113  third trimester  Abnormal biochemical finding on antenatal      O28.1  screening of mother AFP 4.36  Hypertension - Chronic/Pre-existing            O10.019  LR NIPS, Neg Horizon ---------------------------------------------------------------------- Fetal Evaluation  Num Of Fetuses:         1  Preg. Location:         Intrauterine  Fetal Heart Rate(bpm):  141  Cardiac Activity:       Observed  Presentation:           Cephalic  Placenta:               Anterior  Amniotic Fluid  AFI FV:      Within normal limits                              Largest Pocket(cm)                              5.17 ---------------------------------------------------------------------- Biometry  BPD:      67.6  mm     G. Age:  27w 2d         41  %    CI:        73.26   %    70 - 86                                                          FL/HC:      19.8   %    18.6 - 20.4  HC:       251   mm     G. Age:  27w 2d         25  %    HC/AC:      1.07        1.05 - 1.21  AC:      233.5  mm     G. Age:  27w 5d         59  %    FL/BPD:     73.5   %    71 - 87  FL:       49.7  mm      G. Age:  26w 5d         25  %    FL/AC:      21.3   %    20 - 24  HUM:      48.5  mm     G. Age:  28w 4d  75  %  LV:        5.5  mm  Est. FW:    1055  gm      2 lb 5 oz     44  % ---------------------------------------------------------------------- OB History  Gravidity:    2          SAB:   1  Living:       0 ---------------------------------------------------------------------- Gestational Age  LMP:           28w 5d        Date:  10/14/20                 EDD:   07/21/21  U/S Today:     27w 2d                                        EDD:   07/31/21  Best:          27w 1d     Det. ByLoman Chroman         EDD:   08/01/21                                      (12/05/20) ---------------------------------------------------------------------- Anatomy  Cranium:               Appears normal         Aortic Arch:            Appears normal  Cavum:                 Appears normal         Ductal Arch:            Appears normal  Ventricles:            Appears normal         Diaphragm:              Appears normal  Choroid Plexus:        Previously seen        Stomach:                Appears normal, left                                                                        sided  Cerebellum:            Previously seen        Abdomen:                Previously seen  Posterior Fossa:       Previously seen        Abdominal Wall:         Previously seen  Nuchal Fold:           Previously seen        Cord Vessels:           Previously seen  Face:  Orbits and profile     Kidneys:                Appear normal                         previously seen  Lips:                  Previously seen        Bladder:                Appears normal  Thoracic:              Previously seen        Spine:                  Ltd views no                                                                        intracranial signs of                                                                        NTD  Heart:                  Appears normal         Upper Extremities:      Previously seen                         (4CH, axis, and                         situs)  RVOT:                  Appears normal         Lower Extremities:      Previously seen  LVOT:                  Appears normal  Other:  Fetus appears to be female. Heels and 5th digit previously visualized.          VC not well seen, 3VV and 3VTV previously visualized. ---------------------------------------------------------------------- Cervix Uterus Adnexa  Cervix  Normal appearance by transabdominal scan.  Uterus  No abnormality visualized.  Right Ovary  Not visualized.  Left Ovary  Not visualized.  Cul De Sac  No free fluid seen.  Adnexa  No abnormality visualized. ---------------------------------------------------------------------- Impression  Type 2 diabetes.  Patient takes Lantus insulin 18 units at  night and NovoLog 8 units with breakfast.  She reports her  blood glucose levels are within normal range.  She had normal fetal echocardiography [facility]).  Chronic hypertension.  Patient will be starting Procardia from  tonight.  Blood pressure today at her office is 140/96 mmHg.  Amniotic fluid is normal and good fetal activity seen.  Fetal  growth is appropriate for gestational age. ---------------------------------------------------------------------- Recommendations  -An  appointment was made for her to return in 4 weeks for  fetal growth assessment.  -Weekly BPP from [redacted] weeks gestation till delivery. ----------------------------------------------------------------------                  Tama High, MD Electronically Signed Final Report   05/03/2021 10:35 am ----------------------------------------------------------------------   Future Appointments  Date Time Provider [place]  06/07/2021  9:30 AM [facility] NURSE [facility]  06/07/2021  9:45 AM [facility] US4 [facility] [place] [facility]  06/12/2021  2:55 PM Donnamae Jude, MD [facility]  06/14/2021  9:30 AM [facility] NURSE [facility]  [facility]  06/14/2021  9:45 AM [facility] US5 [facility]  06/21/2021  9:30 AM [facility] NURSE [facility]  06/21/2021  9:45 AM [facility] US5 [facility]    Discharge Condition: Stable  Discharge disposition: 01-Home or Self Care      Discharge Instructions     Discharge activity:  No Restrictions   Complete by: As directed    Discharge diet:  No restrictions   Complete by: As directed    Fetal Kick Count:  Lie on our left side for one hour after a meal, and count the number of times your baby kicks.  If it is less than 5 times, get up, move around and drink some juice.  Repeat the test 30 minutes later.  If it is still less than 5 kicks in an hour, notify your doctor.   Complete by: As directed    No sexual activity restrictions   Complete by: As directed    Notify physician for a general feeling that "something is not right"   Complete by: As directed    Notify physician for increase or change in vaginal discharge   Complete by: As directed    Notify physician for intestinal cramps, with or without diarrhea, sometimes described as "gas pain"   Complete by: As directed    Notify physician for leaking of fluid   Complete by: As directed    Notify physician for low, dull backache, unrelieved by heat or Tylenol   Complete by: As directed    Notify physician for menstrual like cramps   Complete by: As directed    Notify physician for pelvic pressure   Complete by: As directed    Notify physician for uterine contractions.  These may be painless and feel like the uterus is tightening or the baby is  "balling up"   Complete by: As directed    Notify physician for vaginal bleeding   Complete by: As directed    PRETERM LABOR:  Includes any of the follwing symptoms that occur between 20 - [redacted] weeks gestation.  If these symptoms are not stopped, preterm labor can result in preterm delivery, placing your baby at risk   Complete by: As directed       Allergies as of 06/02/2021        Reactions   Iodides Anaphylaxis, Itching   Morphine And Related Anaphylaxis, Itching   Shellfish Allergy Anaphylaxis, Itching   Pt reports "watery eyes"   Ultram [tramadol Hcl] Hives   Hives and swollen lips         Medication List     TAKE these medications    Accu-Chek Guide test strip Generic drug: glucose blood   Accu-Chek Softclix Lancets lancets SMARTSIG:2 Topical Twice Daily   aspirin EC 81 MG tablet Take 1 tablet (81 mg total) by mouth daily. Swallow whole.   blood glucose meter kit and supplies Dispense  based on patient and insurance preference. Use up to four times daily as directed. (FOR ICD-10 E10.9, E11.9).   Blood Pressure Monitoring Devi 1 each by Does not apply route once a week.   Dexcom G6 Sensor Misc 1 each every 10 days   Dexcom G6 Transmitter Misc 1 each by Does not apply route every 3 (three) months.   famotidine 10 MG tablet Commonly known as: PEPCID Take 1 tablet (10 mg total) by mouth 2 (two) times daily.   insulin aspart 100 UNIT/ML injection Commonly known as: novoLOG For use in Omnipod.  Current TDD 66 units. Patient to adjust per MD orders.   insulin glargine 100 UNIT/ML Solostar Pen Commonly known as: LANTUS Inject 18 Units into the skin 2 (two) times daily. 18 units with breakfast and 18 units just before bed What changed:  how much to take additional instructions   Insulin Pen Needle 31G X 5 MM Misc 1 Device by Does not apply route 4 (four) times daily. For use with insulin pens   labetalol 200 MG tablet Commonly known as: NORMODYNE Take 1 tablet (200 mg total) by mouth 2 (two) times daily.   NIFEdipine 60 MG 24 hr tablet Commonly known as: ADALAT CC Take 1 tablet (60 mg total) by mouth 2 (two) times daily. What changed: when to take this   ondansetron 8 MG disintegrating tablet Commonly known as: Zofran ODT Take 1 tablet (8 mg total) by mouth every 8 (eight) hours as needed for nausea or vomiting.   Prenatal Vitamins  28-0.8 MG Tabs Take 1 tablet by mouth daily.   promethazine 25 MG tablet Commonly known as: PHENERGAN Take 1 tablet (25 mg total) by mouth every 6 (six) hours as needed for nausea or vomiting.   scopolamine 1 MG/3DAYS Commonly known as: Transderm-Scop (1.5 MG) Place 1 patch (1.5 mg total) onto the skin every 3 (three) days.        [facility]up [facility] Follow up in 1 week(s).   Specialty: Obstetrics and Gynecology Why: Pt should already be scheduled for office visit, add NST/BPP Contact information: [place] [id number] [phone]                Total discharge time: 20 minutes   Signed: Griffin Basil M.D. 06/02/2021, 9:43 AM

## 2021-06-02 NOTE — Progress Notes (Signed)
CBG 98 Not a true fasting. Blood sugar taken 30 min after pt had already eaten breakfast.

## 2021-06-07 ENCOUNTER — Encounter: Payer: Self-pay | Admitting: *Deleted

## 2021-06-07 ENCOUNTER — Other Ambulatory Visit: Payer: Self-pay

## 2021-06-07 ENCOUNTER — Ambulatory Visit: Payer: 59 | Admitting: *Deleted

## 2021-06-07 ENCOUNTER — Ambulatory Visit: Payer: 59 | Attending: Obstetrics and Gynecology

## 2021-06-07 VITALS — BP 130/86 | HR 87

## 2021-06-07 DIAGNOSIS — Z3A31 31 weeks gestation of pregnancy: Secondary | ICD-10-CM

## 2021-06-07 DIAGNOSIS — Z3A32 32 weeks gestation of pregnancy: Secondary | ICD-10-CM

## 2021-06-07 DIAGNOSIS — O280 Abnormal hematological finding on antenatal screening of mother: Secondary | ICD-10-CM

## 2021-06-07 DIAGNOSIS — O99013 Anemia complicating pregnancy, third trimester: Secondary | ICD-10-CM | POA: Insufficient documentation

## 2021-06-07 DIAGNOSIS — O24113 Pre-existing diabetes mellitus, type 2, in pregnancy, third trimester: Secondary | ICD-10-CM | POA: Diagnosis present

## 2021-06-07 DIAGNOSIS — O113 Pre-existing hypertension with pre-eclampsia, third trimester: Secondary | ICD-10-CM | POA: Diagnosis not present

## 2021-06-07 DIAGNOSIS — E119 Type 2 diabetes mellitus without complications: Secondary | ICD-10-CM

## 2021-06-07 DIAGNOSIS — O0990 Supervision of high risk pregnancy, unspecified, unspecified trimester: Secondary | ICD-10-CM | POA: Diagnosis present

## 2021-06-09 ENCOUNTER — Ambulatory Visit: Payer: 59

## 2021-06-12 ENCOUNTER — Other Ambulatory Visit: Payer: Self-pay

## 2021-06-12 ENCOUNTER — Ambulatory Visit (INDEPENDENT_AMBULATORY_CARE_PROVIDER_SITE_OTHER): Payer: 59 | Admitting: Family Medicine

## 2021-06-12 VITALS — BP 134/88 | HR 85 | Wt 215.4 lb

## 2021-06-12 DIAGNOSIS — O119 Pre-existing hypertension with pre-eclampsia, unspecified trimester: Secondary | ICD-10-CM

## 2021-06-12 DIAGNOSIS — O0990 Supervision of high risk pregnancy, unspecified, unspecified trimester: Secondary | ICD-10-CM

## 2021-06-12 DIAGNOSIS — O10919 Unspecified pre-existing hypertension complicating pregnancy, unspecified trimester: Secondary | ICD-10-CM

## 2021-06-12 DIAGNOSIS — O24112 Pre-existing diabetes mellitus, type 2, in pregnancy, second trimester: Secondary | ICD-10-CM

## 2021-06-12 NOTE — Progress Notes (Signed)
    PRENATAL VISIT NOTE  Subjective:  Misty [name] is a 30 y.o. G2P0010 at [redacted]w[redacted]d being seen today for ongoing prenatal care.  She is currently monitored for the following issues for this high-risk pregnancy and has Chronic hypertension affecting pregnancy; Supervision of high risk pregnancy, antepartum; Type 2 diabetes mellitus complicating pregnancy in second trimester, antepartum; Abnormal antenatal AFP screen; Marijuana use; Chronic hypertension with superimposed pre-eclampsia; and Anemia in pregnancy, third trimester on their problem list.  Patient reports  LE swelling .  Contractions: Not present. Vag. Bleeding: None.  Movement: Present. Denies leaking of fluid.   The following portions of the patient's history were reviewed and updated as appropriate: allergies, current medications, past family history, past medical history, past social history, past surgical history and problem list.   Objective:   Vitals:   06/12/21 1504 06/12/21 1507  BP: (!) 135/94 134/88  Pulse: 84 85  Weight: 215 lb 6.4 oz (97.7 kg)     Fetal Status: Fetal Heart Rate (bpm): 151   Movement: Present     General:  Alert, oriented and cooperative. Patient is in no acute distress.  Skin: Skin is warm and dry. No rash noted.   Cardiovascular: Normal heart rate noted  Respiratory: Normal respiratory effort, no problems with respiration noted  Abdomen: Soft, gravid, appropriate for gestational age.  Pain/Pressure: Present     Pelvic: Cervical exam deferred        Extremities: Normal range of motion.  Edema: Trace  Mental Status: Normal mood and affect. Normal behavior. Normal judgment and thought content.   Assessment and Plan:  Pregnancy: G2P0010 at [redacted]w[redacted]d 1. Chronic hypertension affecting pregnancy BP is ok on Labetalol and Nifedipine  2. Chronic hypertension with superimposed pre-eclampsia Weekly labs No s/sx's of severe features--but delivery at 37 wks, unless meets criteria for severe features to  deliver at 34 wks In testing On ASA - CBC - Comprehensive metabolic panel - Protein / creatinine ratio, urine  3. Type 2 diabetes mellitus complicating pregnancy in second trimester, antepartum No log today--on omnipod--reports CBGs are close to range  4. Supervision of high risk pregnancy, antepartum   Preterm labor symptoms and general obstetric precautions including but not limited to vaginal bleeding, contractions, leaking of fluid and fetal movement were reviewed in detail with the patient. Please refer to After Visit Summary for other counseling recommendations.   Return in about 1 week (around 06/19/2021) for [facility].  Future Appointments  Date Time Provider Department Center  06/14/2021  9:30 AM [facility] NURSE [facility]  06/14/2021  9:45 AM [facility] US5 [facility]  06/16/2021 10:30 AM [facility] NURSE [facility]  06/16/2021 10:45 AM [facility] NST [facility]  06/21/2021  9:30 AM [facility] NURSE [facility]  06/21/2021  9:45 AM [facility] US5 [facility]  06/23/2021  9:30 AM [facility] NURSE [facility]  06/23/2021  9:45 AM [facility] NST [facility]  06/27/2021  2:15 PM Eckstat, Mary Sella, MD [facility]    Reva Bores, MD

## 2021-06-12 NOTE — Progress Notes (Signed)
Pt did noit bring Glucose Log.

## 2021-06-13 LAB — COMPREHENSIVE METABOLIC PANEL
ALT: 13 IU/L (ref 0–32)
AST: 20 IU/L (ref 0–40)
Albumin/Globulin Ratio: 1.4 (ref 1.2–2.2)
Albumin: 3.7 g/dL — ABNORMAL LOW (ref 3.9–5.0)
Alkaline Phosphatase: 183 IU/L — ABNORMAL HIGH (ref 44–121)
BUN/Creatinine Ratio: 13 (ref 9–23)
BUN: 11 mg/dL (ref 6–20)
Bilirubin Total: <0.2 mg/dL (ref 0.0–1.2)
CO2: 20 mmol/L (ref 20–29)
Calcium: 9.3 mg/dL (ref 8.7–10.2)
Chloride: 100 mmol/L (ref 96–106)
Creatinine, Ser: 0.86 mg/dL (ref 0.57–1.00)
Globulin, Total: 2.7 g/dL (ref 1.5–4.5)
Glucose: 134 mg/dL — ABNORMAL HIGH (ref 70–99)
Potassium: 5 mmol/L (ref 3.5–5.2)
Sodium: 136 mmol/L (ref 134–144)
Total Protein: 6.4 g/dL (ref 6.0–8.5)
eGFR: 93 mL/min/{1.73_m2} (ref >59)

## 2021-06-13 LAB — CBC
Hematocrit: 32.8 % — ABNORMAL LOW (ref 34.0–46.6)
Hemoglobin: 11.2 g/dL (ref 11.1–15.9)
MCH: 33.4 pg — ABNORMAL HIGH (ref 26.6–33.0)
MCHC: 34.1 g/dL (ref 31.5–35.7)
MCV: 98 fL — ABNORMAL HIGH (ref 79–97)
Platelets: 283 10*3/uL (ref 150–450)
RBC: 3.35 x10E6/uL — ABNORMAL LOW (ref 3.77–5.28)
RDW: 13.1 % (ref 11.7–15.4)
WBC: 6.8 10*3/uL (ref 3.4–10.8)

## 2021-06-14 ENCOUNTER — Ambulatory Visit: Payer: Medicaid Other | Admitting: *Deleted

## 2021-06-14 ENCOUNTER — Other Ambulatory Visit: Payer: Self-pay | Admitting: Obstetrics and Gynecology

## 2021-06-14 ENCOUNTER — Other Ambulatory Visit: Payer: Self-pay | Admitting: *Deleted

## 2021-06-14 ENCOUNTER — Other Ambulatory Visit: Payer: Self-pay

## 2021-06-14 ENCOUNTER — Encounter: Payer: Self-pay | Admitting: *Deleted

## 2021-06-14 ENCOUNTER — Ambulatory Visit (HOSPITAL_BASED_OUTPATIENT_CLINIC_OR_DEPARTMENT_OTHER): Payer: Medicaid Other | Admitting: Obstetrics and Gynecology

## 2021-06-14 ENCOUNTER — Ambulatory Visit (HOSPITAL_BASED_OUTPATIENT_CLINIC_OR_DEPARTMENT_OTHER): Payer: Medicaid Other | Admitting: *Deleted

## 2021-06-14 ENCOUNTER — Ambulatory Visit: Payer: Medicaid Other | Attending: Obstetrics and Gynecology

## 2021-06-14 VITALS — BP 129/87 | HR 92

## 2021-06-14 DIAGNOSIS — O1490 Unspecified pre-eclampsia, unspecified trimester: Secondary | ICD-10-CM

## 2021-06-14 DIAGNOSIS — O119 Pre-existing hypertension with pre-eclampsia, unspecified trimester: Secondary | ICD-10-CM

## 2021-06-14 DIAGNOSIS — O24113 Pre-existing diabetes mellitus, type 2, in pregnancy, third trimester: Secondary | ICD-10-CM

## 2021-06-14 DIAGNOSIS — O281 Abnormal biochemical finding on antenatal screening of mother: Secondary | ICD-10-CM | POA: Diagnosis not present

## 2021-06-14 DIAGNOSIS — O113 Pre-existing hypertension with pre-eclampsia, third trimester: Secondary | ICD-10-CM

## 2021-06-14 DIAGNOSIS — O99013 Anemia complicating pregnancy, third trimester: Secondary | ICD-10-CM | POA: Diagnosis not present

## 2021-06-14 DIAGNOSIS — O280 Abnormal hematological finding on antenatal screening of mother: Secondary | ICD-10-CM | POA: Diagnosis not present

## 2021-06-14 DIAGNOSIS — Z3A33 33 weeks gestation of pregnancy: Secondary | ICD-10-CM

## 2021-06-14 DIAGNOSIS — O24913 Unspecified diabetes mellitus in pregnancy, third trimester: Secondary | ICD-10-CM

## 2021-06-14 DIAGNOSIS — O0990 Supervision of high risk pregnancy, unspecified, unspecified trimester: Secondary | ICD-10-CM | POA: Insufficient documentation

## 2021-06-14 DIAGNOSIS — O1403 Mild to moderate pre-eclampsia, third trimester: Secondary | ICD-10-CM

## 2021-06-14 DIAGNOSIS — O10913 Unspecified pre-existing hypertension complicating pregnancy, third trimester: Secondary | ICD-10-CM

## 2021-06-14 LAB — PROTEIN / CREATININE RATIO, URINE
Creatinine, Urine: 147.7 mg/dL
Protein, Ur: 26 mg/dL
Protein/Creat Ratio: 176 mg/g creat (ref 0–200)

## 2021-06-14 NOTE — Procedures (Signed)
Misty [name] Aug 23, 1991 [redacted]w[redacted]d  Fetus A Non-Stress Test Interpretation for 06/14/21  Indication:  pre-eclampsia  Fetal Heart Rate A Mode: External Baseline Rate (A): 150 bpm Variability: Moderate Accelerations: 15 x 15 Decelerations: None Multiple birth?: No  Uterine Activity Mode: Palpation, Toco Contraction Frequency (min): 4 uc's with ui Contraction Duration (sec): 40 Contraction Quality: Mild Resting Tone Palpated: Relaxed Resting Time: Adequate  Interpretation (Fetal Testing) Nonstress Test Interpretation: Reactive Overall Impression: Reassuring for gestational age Comments: Dr. Judeth Cornfield reviewed tracing.

## 2021-06-14 NOTE — Progress Notes (Addendum)
Maternal-Fetal Medicine   Name: Misty [name] DOB: 08-Feb-1991 MRN: [id number] Referring Provider: Tinnie Gens, MD  Misty [name] returned for ultrasound evaluation today. She is G2 P0 at 33w 1d gestation and is here for antenatal testing. Her problems include: -Chronic hypertension with superimposed preeclampsia without severe features.  Patient takes labetalol 200 mg twice daily and nifedipine XL 60 mg daily.  Blood pressure today at her office is 129/87 mmHg. Patient was admitted on 05/29/2021 and was discharged 4 days later.  Admission labs including liver enzymes, platelets and serum creatinine were within normal range.  Repeat labs drawn on 06/12/2021 were within normal range.  Patient does not have symptoms of severe features of preeclampsia.   -Type 2 diabetes.  Patient takes Lantus insulin 18 units in the morning and 18 units at night, and Humalog 8/8/8 units with each meal.  She reports her fasting levels are below 95 mg/DL and postprandial levels are within normal range.  -Increased to maternal serum alpha-fetoprotein on midtrimester screening: On previous ultrasound assessment, fetal growth was appropriate for gestational age.  Ultrasound Amniotic fluid is normal good fetal activity seen.  Antenatal testing is reassuring.  NST is reactive.  BPP 10/10. I have reassured the patient of the findings.  Our concerns include: Chronic hypertension with superimposed preeclampsia Patient had few blood pressures in the severe hypertensive range on admission that later resolved.  She does not have symptoms and signs of severe features of preeclampsia.  She has a diagnosis of chronic hypertension with superimposed preeclampsia.  Type 2 diabetes Patient reports all her fasting and postprandial levels are within normal range and that diabetes is well controlled. I counseled her on the timing of delivery.  Given that she has super imposed preeclampsia, I recommend delivery at [redacted] weeks gestation.   However if blood pressures are not well controlled with current medications or if diabetes is not well controlled, hospitalization may be considered at [redacted] weeks gestation.  Alternatively, she may be delivered at [redacted] weeks gestation. Patient reports she will be unable to come twice weekly for antenatal testing.  We made appointments for weekly antenatal testing.  Recommendations -Appointments were made for weekly antenatal testing. -Patient has blood pressure cuff at home and will be monitoring her blood pressures.  She understands the parameters. -Delivery at [redacted] weeks gestation. -Inpatient management if blood pressure and/or diabetes is not well controlled before [redacted] weeks gestation.  If patient is not willing for admission, delivery may be considered at [redacted] weeks gestation. -Delivery at 34 weeks or at diagnosis if severe features of preeclampsia are detected.  Thank you for consultation.  If you have any questions or concerns, please contact me the [facility].  Consultation including face-to-face (more than 50%) counseling 30 minutes.

## 2021-06-16 ENCOUNTER — Ambulatory Visit: Payer: 59

## 2021-06-17 ENCOUNTER — Inpatient Hospital Stay (HOSPITAL_COMMUNITY)
Admission: AD | Admit: 2021-06-17 | Discharge: 2021-06-26 | DRG: 806 | Disposition: A | Discharge status: Home / Self Care | Payer: 59 | Attending: Obstetrics and Gynecology | Admitting: Obstetrics and Gynecology

## 2021-06-17 ENCOUNTER — Encounter (HOSPITAL_COMMUNITY): Payer: Self-pay | Admitting: Family Medicine

## 2021-06-17 ENCOUNTER — Other Ambulatory Visit: Payer: Self-pay

## 2021-06-17 DIAGNOSIS — O2412 Pre-existing diabetes mellitus, type 2, in childbirth: Secondary | ICD-10-CM | POA: Diagnosis present

## 2021-06-17 DIAGNOSIS — O99324 Drug use complicating childbirth: Secondary | ICD-10-CM | POA: Diagnosis present

## 2021-06-17 DIAGNOSIS — F1290 Cannabis use, unspecified, uncomplicated: Secondary | ICD-10-CM | POA: Diagnosis present

## 2021-06-17 DIAGNOSIS — O9081 Anemia of the puerperium: Secondary | ICD-10-CM | POA: Diagnosis not present

## 2021-06-17 DIAGNOSIS — I10 Essential (primary) hypertension: Secondary | ICD-10-CM | POA: Diagnosis not present

## 2021-06-17 DIAGNOSIS — O119 Pre-existing hypertension with pre-eclampsia, unspecified trimester: Secondary | ICD-10-CM | POA: Diagnosis not present

## 2021-06-17 DIAGNOSIS — Z87891 Personal history of nicotine dependence: Secondary | ICD-10-CM | POA: Diagnosis not present

## 2021-06-17 DIAGNOSIS — O1002 Pre-existing essential hypertension complicating childbirth: Secondary | ICD-10-CM | POA: Diagnosis present

## 2021-06-17 DIAGNOSIS — O24424 Gestational diabetes mellitus in childbirth, insulin controlled: Secondary | ICD-10-CM | POA: Diagnosis not present

## 2021-06-17 DIAGNOSIS — O280 Abnormal hematological finding on antenatal screening of mother: Secondary | ICD-10-CM | POA: Diagnosis present

## 2021-06-17 DIAGNOSIS — O99019 Anemia complicating pregnancy, unspecified trimester: Secondary | ICD-10-CM | POA: Diagnosis present

## 2021-06-17 DIAGNOSIS — O1410 Severe pre-eclampsia, unspecified trimester: Secondary | ICD-10-CM | POA: Diagnosis present

## 2021-06-17 DIAGNOSIS — D62 Acute posthemorrhagic anemia: Secondary | ICD-10-CM | POA: Diagnosis not present

## 2021-06-17 DIAGNOSIS — O0990 Supervision of high risk pregnancy, unspecified, unspecified trimester: Secondary | ICD-10-CM

## 2021-06-17 DIAGNOSIS — E119 Type 2 diabetes mellitus without complications: Secondary | ICD-10-CM | POA: Diagnosis present

## 2021-06-17 DIAGNOSIS — Z794 Long term (current) use of insulin: Secondary | ICD-10-CM

## 2021-06-17 DIAGNOSIS — O114 Pre-existing hypertension with pre-eclampsia, complicating childbirth: Principal | ICD-10-CM | POA: Diagnosis present

## 2021-06-17 DIAGNOSIS — O24112 Pre-existing diabetes mellitus, type 2, in pregnancy, second trimester: Secondary | ICD-10-CM | POA: Diagnosis present

## 2021-06-17 DIAGNOSIS — Z3A33 33 weeks gestation of pregnancy: Secondary | ICD-10-CM

## 2021-06-17 DIAGNOSIS — Z20822 Contact with and (suspected) exposure to covid-19: Secondary | ICD-10-CM | POA: Diagnosis present

## 2021-06-17 DIAGNOSIS — O1414 Severe pre-eclampsia complicating childbirth: Secondary | ICD-10-CM | POA: Diagnosis not present

## 2021-06-17 DIAGNOSIS — O24113 Pre-existing diabetes mellitus, type 2, in pregnancy, third trimester: Secondary | ICD-10-CM

## 2021-06-17 DIAGNOSIS — O99013 Anemia complicating pregnancy, third trimester: Secondary | ICD-10-CM | POA: Diagnosis present

## 2021-06-17 DIAGNOSIS — O10919 Unspecified pre-existing hypertension complicating pregnancy, unspecified trimester: Secondary | ICD-10-CM | POA: Diagnosis present

## 2021-06-17 DIAGNOSIS — Z8759 Personal history of other complications of pregnancy, childbirth and the puerperium: Secondary | ICD-10-CM | POA: Diagnosis present

## 2021-06-17 LAB — URINALYSIS, ROUTINE W REFLEX MICROSCOPIC
Bilirubin Urine: NEGATIVE
Glucose, UA: NEGATIVE mg/dL
Hgb urine dipstick: NEGATIVE
Ketones, ur: NEGATIVE mg/dL
Leukocytes,Ua: NEGATIVE
Nitrite: NEGATIVE
Protein, ur: 100 mg/dL — AB
Specific Gravity, Urine: 1.006 (ref 1.005–1.030)
pH: 7 (ref 5.0–8.0)

## 2021-06-17 LAB — WET PREP, GENITAL
Sperm: NONE SEEN
Trich, Wet Prep: NONE SEEN
Yeast Wet Prep HPF POC: NONE SEEN

## 2021-06-17 LAB — COMPREHENSIVE METABOLIC PANEL
ALT: 18 U/L (ref 0–44)
AST: 29 U/L (ref 15–41)
Albumin: 2.9 g/dL — ABNORMAL LOW (ref 3.5–5.0)
Alkaline Phosphatase: 183 U/L — ABNORMAL HIGH (ref 38–126)
Anion gap: 9 (ref 5–15)
BUN: 7 mg/dL (ref 6–20)
CO2: 22 mmol/L (ref 22–32)
Calcium: 8.7 mg/dL — ABNORMAL LOW (ref 8.9–10.3)
Chloride: 104 mmol/L (ref 98–111)
Creatinine, Ser: 0.76 mg/dL (ref 0.44–1.00)
GFR, Estimated: >60 mL/min (ref >60)
Glucose, Bld: 74 mg/dL (ref 70–99)
Potassium: 4.3 mmol/L (ref 3.5–5.1)
Sodium: 135 mmol/L (ref 135–145)
Total Bilirubin: 0.3 mg/dL (ref 0.3–1.2)
Total Protein: 6.6 g/dL (ref 6.5–8.1)

## 2021-06-17 LAB — CBC
HCT: 33.4 % — ABNORMAL LOW (ref 36.0–46.0)
Hemoglobin: 11.6 g/dL — ABNORMAL LOW (ref 12.0–15.0)
MCH: 33.3 pg (ref 26.0–34.0)
MCHC: 34.7 g/dL (ref 30.0–36.0)
MCV: 96 fL (ref 80.0–100.0)
Platelets: 310 10*3/uL (ref 150–400)
RBC: 3.48 MIL/uL — ABNORMAL LOW (ref 3.87–5.11)
RDW: 12.4 % (ref 11.5–15.5)
WBC: 6.5 10*3/uL (ref 4.0–10.5)
nRBC: 0 % (ref 0.0–0.2)

## 2021-06-17 LAB — RESP PANEL BY RT-PCR (FLU A&B, COVID) ARPGX2
Influenza A by PCR: NEGATIVE
Influenza B by PCR: NEGATIVE
SARS Coronavirus 2 by RT PCR: NEGATIVE

## 2021-06-17 LAB — PROTEIN / CREATININE RATIO, URINE
Creatinine, Urine: 41.24 mg/dL
Protein Creatinine Ratio: 2.35 mg/mg{Cre} — ABNORMAL HIGH (ref 0.00–0.15)
Total Protein, Urine: 97 mg/dL

## 2021-06-17 LAB — GLUCOSE, CAPILLARY
Glucose-Capillary: 45 mg/dL — ABNORMAL LOW (ref 70–99)
Glucose-Capillary: 62 mg/dL — ABNORMAL LOW (ref 70–99)
Glucose-Capillary: 106 mg/dL — ABNORMAL HIGH (ref 70–99)
Glucose-Capillary: 121 mg/dL — ABNORMAL HIGH (ref 70–99)

## 2021-06-17 LAB — OB RESULTS CONSOLE GC/CHLAMYDIA: Gonorrhea: NEGATIVE

## 2021-06-17 MED ORDER — SODIUM CHLORIDE 0.9% FLUSH: 3.0000 mL | INTRAVENOUS | Status: DC | PRN

## 2021-06-17 MED ORDER — FAMOTIDINE 20 MG PO TABS
10.0000 mg | ORAL_TABLET | Freq: Two times a day (BID) | ORAL | Status: DC
  Administered 2021-06-17 – 2021-06-19 (×5): 10 mg via ORAL
  Filled 2021-06-17 (×5): qty 1

## 2021-06-17 MED ORDER — BISACODYL 10 MG RE SUPP: 10.0000 mg | Freq: Once | RECTAL | Status: DC

## 2021-06-17 MED ORDER — HYDRALAZINE HCL 20 MG/ML IJ SOLN: 10.0000 mg | INTRAMUSCULAR | Status: DC | PRN

## 2021-06-17 MED ORDER — ZOLPIDEM TARTRATE 5 MG PO TABS
5.0000 mg | ORAL_TABLET | Freq: Every evening | ORAL | Status: DC | PRN
  Administered 2021-06-19: 5 mg via ORAL
  Filled 2021-06-17: qty 1

## 2021-06-17 MED ORDER — INSULIN ASPART 100 UNIT/ML IJ SOLN
0.0000 [IU] | Freq: Three times a day (TID) | INTRAMUSCULAR | Status: DC
  Administered 2021-06-17: 2 [IU] via SUBCUTANEOUS

## 2021-06-17 MED ORDER — DOCUSATE SODIUM 100 MG PO CAPS
100.0000 mg | ORAL_CAPSULE | Freq: Every day | ORAL | Status: DC
  Administered 2021-06-17 – 2021-06-19 (×3): 100 mg via ORAL
  Filled 2021-06-17 (×3): qty 1

## 2021-06-17 MED ORDER — LABETALOL HCL 200 MG PO TABS
300.0000 mg | ORAL_TABLET | Freq: Three times a day (TID) | ORAL | Status: DC
  Administered 2021-06-17 – 2021-06-19 (×7): 300 mg via ORAL
  Filled 2021-06-17 (×7): qty 1

## 2021-06-17 MED ORDER — LABETALOL HCL 5 MG/ML IV SOLN
40.0000 mg | INTRAVENOUS | Status: DC | PRN
  Administered 2021-06-17: 40 mg via INTRAVENOUS
  Filled 2021-06-17 (×2): qty 8

## 2021-06-17 MED ORDER — SODIUM CHLORIDE 0.9% FLUSH
3.0000 mL | Freq: Two times a day (BID) | INTRAVENOUS | Status: DC
  Administered 2021-06-18 – 2021-06-19 (×4): 3 mL via INTRAVENOUS

## 2021-06-17 MED ORDER — INSULIN ASPART 100 UNIT/ML IJ SOLN: 18.0000 [IU] | Freq: Three times a day (TID) | INTRAMUSCULAR | Status: DC

## 2021-06-17 MED ORDER — INSULIN GLARGINE-YFGN 100 UNIT/ML ~~LOC~~ SOLN
18.0000 [IU] | SUBCUTANEOUS | Status: DC
  Administered 2021-06-17 – 2021-06-18 (×2): 18 [IU] via SUBCUTANEOUS
  Filled 2021-06-17 (×4): qty 0.18

## 2021-06-17 MED ORDER — LACTATED RINGERS IV SOLN: INTRAVENOUS | Status: DC

## 2021-06-17 MED ORDER — PROMETHAZINE HCL 25 MG PO TABS
25.0000 mg | ORAL_TABLET | Freq: Four times a day (QID) | ORAL | Status: DC | PRN
  Filled 2021-06-17: qty 1

## 2021-06-17 MED ORDER — LABETALOL HCL 5 MG/ML IV SOLN
20.0000 mg | INTRAVENOUS | Status: DC | PRN
  Administered 2021-06-17 – 2021-06-20 (×2): 20 mg via INTRAVENOUS
  Filled 2021-06-17 (×2): qty 4

## 2021-06-17 MED ORDER — ACETAMINOPHEN 325 MG PO TABS: 650.0000 mg | ORAL_TABLET | ORAL | Status: DC | PRN

## 2021-06-17 MED ORDER — NIFEDIPINE ER OSMOTIC RELEASE 60 MG PO TB24
60.0000 mg | ORAL_TABLET | Freq: Two times a day (BID) | ORAL | Status: DC
  Administered 2021-06-17 – 2021-06-25 (×16): 60 mg via ORAL
  Filled 2021-06-17 (×18): qty 1

## 2021-06-17 MED ORDER — LABETALOL HCL 5 MG/ML IV SOLN
INTRAVENOUS | Status: AC
  Administered 2021-06-17: 20 mg via INTRAVENOUS
  Filled 2021-06-17: qty 4

## 2021-06-17 MED ORDER — FENTANYL CITRATE (PF) 100 MCG/2ML IJ SOLN
100.0000 ug | INTRAMUSCULAR | Status: DC | PRN
  Administered 2021-06-17: 100 ug via INTRAVENOUS
  Filled 2021-06-17: qty 2

## 2021-06-17 MED ORDER — NIFEDIPINE 10 MG PO CAPS
10.0000 mg | ORAL_CAPSULE | Freq: Once | ORAL | Status: AC
  Administered 2021-06-17: 10 mg via ORAL
  Filled 2021-06-17: qty 1

## 2021-06-17 MED ORDER — LACTATED RINGERS IV BOLUS
1000.0000 mL | Freq: Once | INTRAVENOUS | Status: AC
  Administered 2021-06-17: 1000 mL via INTRAVENOUS

## 2021-06-17 MED ORDER — ASPIRIN EC 81 MG PO TBEC
81.0000 mg | DELAYED_RELEASE_TABLET | Freq: Every day | ORAL | Status: DC
  Administered 2021-06-17 – 2021-06-19 (×3): 81 mg via ORAL
  Filled 2021-06-17 (×4): qty 1

## 2021-06-17 MED ORDER — PRENATAL MULTIVITAMIN CH
1.0000 | ORAL_TABLET | Freq: Every day | ORAL | Status: DC
  Administered 2021-06-19: 1 via ORAL
  Filled 2021-06-17: qty 1

## 2021-06-17 MED ORDER — INSULIN ASPART 100 UNIT/ML IJ SOLN: 8.0000 [IU] | Freq: Three times a day (TID) | INTRAMUSCULAR | Status: DC

## 2021-06-17 MED ORDER — CALCIUM CARBONATE ANTACID 500 MG PO CHEW: 2.0000 | CHEWABLE_TABLET | ORAL | Status: DC | PRN

## 2021-06-17 MED ORDER — LABETALOL HCL 5 MG/ML IV SOLN: 20.0000 mg | INTRAVENOUS | Status: DC | PRN

## 2021-06-17 MED ORDER — LABETALOL HCL 5 MG/ML IV SOLN
40.0000 mg | INTRAVENOUS | Status: DC | PRN
  Administered 2021-06-20: 40 mg via INTRAVENOUS
  Filled 2021-06-17: qty 8

## 2021-06-17 MED ORDER — LABETALOL HCL 5 MG/ML IV SOLN
80.0000 mg | INTRAVENOUS | Status: DC | PRN
  Administered 2021-06-20: 80 mg via INTRAVENOUS
  Filled 2021-06-17: qty 16

## 2021-06-17 MED ORDER — ONDANSETRON 4 MG PO TBDP: 8.0000 mg | ORAL_TABLET | Freq: Three times a day (TID) | ORAL | Status: DC | PRN

## 2021-06-17 MED ORDER — HYDRALAZINE HCL 20 MG/ML IJ SOLN
10.0000 mg | INTRAMUSCULAR | Status: DC | PRN
  Administered 2021-06-17: 10 mg via INTRAVENOUS

## 2021-06-17 MED ORDER — SODIUM CHLORIDE 0.9 % IV SOLN: 250.0000 mL | INTRAVENOUS | Status: DC | PRN

## 2021-06-17 MED ORDER — SCOPOLAMINE 1 MG/3DAYS TD PT72
1.0000 | MEDICATED_PATCH | TRANSDERMAL | Status: DC
  Administered 2021-06-19: 1.5 mg via TRANSDERMAL
  Filled 2021-06-17: qty 1

## 2021-06-17 MED ORDER — HYDRALAZINE HCL 20 MG/ML IJ SOLN
5.0000 mg | INTRAMUSCULAR | Status: DC | PRN
Start: 2021-06-17 — End: 2021-06-17
  Administered 2021-06-17: 5 mg via INTRAVENOUS
  Filled 2021-06-17: qty 1

## 2021-06-17 NOTE — Progress Notes (Deleted)
PCOT CBG 72. Provider made aware.

## 2021-06-17 NOTE — MAU Note (Addendum)
Having contractions.  Has been contracting since she left the hosp.  Today they are every .  Stomach gets tight and feeling pressure. No bleeding or ROM. Reports +FM. Reports HA, did not take anything, denies visual changes, epigastric pain , reports some increased swelling in her feet.

## 2021-06-17 NOTE — MAU Note (Signed)
Pt denies pain/ contractions, feels so much better now.

## 2021-06-17 NOTE — H&P (Signed)
History     CSN: [id number]  Arrival date and time: 06/17/21 1351   Event Date/Time   First Provider Initiated Contact with Patient 06/17/21 1553      Chief Complaint  Patient presents with   Contractions   [name] is a 30 y.o. year old [id number] female at [redacted]w[redacted]d weeks gestation who presents to [facility] reporting she has had contractions since she left the hospital, but contractions became more painful and every 15 minutes at 0530 this morning. She feels tightness and pressure. She also reports increase swelling in her feet. She denies VB, abnormal vaginal d/c, visual disturbances, or epigastric pain. She has been Rx'd Procardia XL 60 mg BID and Labetalol 200 mg BID; she took the first dose of those medications this morning at 0830. Her high risk pregnancy is managed at [facility]. Her last EFW was 1877 gm (67th %ile) at 31 wks on 05/31/2021. She was recently hospitalized for BP control and her HTN medication regimen was altered. She was d/c'd from [facility] on 06/02/2021.   OB History     Gravida  2   Para  0   Term  0   Preterm  0   AB  1   Living  0      SAB  1   IAB  0   Ectopic  0   Multiple  0   Live Births              Past Medical History:  Diagnosis Date   Abscess 09/02/2020   Allergy    Anemia 2015   Asthma    as child   Chronic hypertension with superimposed preeclampsia 05/24/2021   Diabetes mellitus without complication ([place]) 4580   DKA (diabetic ketoacidosis) ([place]) 09/02/2020   Eczema    Hypertension 09/2013    Past Surgical History:  Procedure Laterality Date   Abcess on back      Family History  Problem Relation Age of Onset   Hypertension Mother    Heart disease Mother 45       CHF   Diabetes Mother    Sickle cell anemia Brother    Hypertension Maternal Grandfather    Diabetes Maternal Grandfather    Heart disease Maternal Grandfather    Healthy Father    Diabetes Maternal Grandmother    Heart disease Maternal Grandmother      Social History   Tobacco Use   Smoking status: Former    Packs/day: 0.50    Years: 2.00    Pack years: 1.00    Types: Cigarettes    Quit date: 09/08/2013    Years since quitting: 7.7   Smokeless tobacco: Never  Vaping Use   Vaping Use: Never used  Substance Use Topics   Alcohol use: No    Alcohol/week: 0.0 standard drinks   Drug use: No    Allergies:  Allergies  Allergen Reactions   Iodides Anaphylaxis and Itching   Morphine And Related Anaphylaxis and Itching   Shellfish Allergy Anaphylaxis and Itching    Pt reports "watery eyes"   Ultram [Tramadol Hcl] Hives    Hives and swollen lips     Medications Prior to Admission  Medication Sig Dispense Refill Last Dose   ACCU-CHEK GUIDE test strip       Accu-Chek Softclix Lancets lancets SMARTSIG:2 Topical Twice Daily      aspirin EC 81 MG tablet Take 1 tablet (81 mg total) by mouth daily. Swallow whole. 90 tablet 3  blood glucose meter kit and supplies Dispense based on patient and insurance preference. Use up to four times daily as directed. (FOR ICD-10 E10.9, E11.9). 1 each 0    Blood Pressure Monitoring DEVI 1 each by Does not apply route once a week. 1 each 0    Continuous Blood Gluc Sensor (DEXCOM G6 SENSOR) MISC 1 each every 10 days (Patient not taking: Reported on 06/14/2021) 3 each 6    Continuous Blood Gluc Transmit (DEXCOM G6 TRANSMITTER) MISC 1 each by Does not apply route every 3 (three) months. (Patient not taking: Reported on 06/14/2021) 1 each 3    famotidine (PEPCID) 10 MG tablet Take 1 tablet (10 mg total) by mouth 2 (two) times daily. 60 tablet 0    insulin aspart (NOVOLOG) 100 UNIT/ML injection For use in Omnipod.  Current TDD 66 units. Patient to adjust per MD orders. (Patient taking differently: For use in Omnipod.  Current TDD 66 units. Patient to adjust per MD orders.) 30 mL 12    insulin glargine (LANTUS) 100 UNIT/ML Solostar Pen Inject 18 Units into the skin 2 (two) times daily. 18 units with breakfast  and 18 units just before bed 15 mL 11    Insulin Pen Needle 31G X 5 MM MISC 1 Device by Does not apply route 4 (four) times daily. For use with insulin pens 100 each 11    labetalol (NORMODYNE) 200 MG tablet Take 1 tablet (200 mg total) by mouth 2 (two) times daily. 60 tablet 0    NIFEdipine (ADALAT CC) 60 MG 24 hr tablet Take 1 tablet (60 mg total) by mouth 2 (two) times daily. 60 tablet 0    ondansetron (ZOFRAN ODT) 8 MG disintegrating tablet Take 1 tablet (8 mg total) by mouth every 8 (eight) hours as needed for nausea or vomiting. 20 tablet 0    Prenatal Vit-Fe Fumarate-FA (PRENATAL VITAMINS) 28-0.8 MG TABS Take 1 tablet by mouth daily. 30 tablet 11    promethazine (PHENERGAN) 25 MG tablet Take 1 tablet (25 mg total) by mouth every 6 (six) hours as needed for nausea or vomiting. 30 tablet 3    scopolamine (TRANSDERM-SCOP, 1.5 MG,) 1 MG/3DAYS Place 1 patch (1.5 mg total) onto the skin every 3 (three) days. 10 patch 0     Review of Systems  Constitutional: Negative.   HENT: Negative.    Eyes: Negative.   Respiratory: Negative.    Cardiovascular:  Positive for leg swelling.  Gastrointestinal: Negative.   Endocrine: Negative.   Genitourinary:  Positive for pelvic pain (increased pelvic pressure).  Musculoskeletal:  Positive for back pain.  Skin: Negative.   Allergic/Immunologic: Negative.   Neurological: Negative.   Hematological: Negative.   Psychiatric/Behavioral: Negative.    Physical Exam   Patient Vitals for the past 24 hrs:  BP Temp Temp src Pulse Resp SpO2 Height Weight  06/17/21 1715 (!) 144/79 -- -- 80 -- 99 % -- --  06/17/21 1700 134/72 -- -- 87 -- 100 % -- --  06/17/21 1655 -- -- -- -- -- 100 % -- --  06/17/21 1650 126/75 -- -- 80 -- 100 % -- --  06/17/21 1646 127/78 -- -- 70 -- -- -- --  06/17/21 1645 -- -- -- -- -- 100 % -- --  06/17/21 1641 (!) 94/41 -- -- 82 -- -- -- --  06/17/21 1640 -- -- -- -- -- 100 % -- --  06/17/21 1635 -- -- -- -- -- 99 % -- --  06/17/21  1631 (!) 159/90 -- -- 88 -- -- -- --  06/17/21 1630 -- -- -- -- -- 99 % -- --  06/17/21 1625 -- -- -- -- -- 100 % -- --  06/17/21 1621 (!) 184/104 -- -- 82 -- -- -- --  06/17/21 1620 -- -- -- -- -- 100 % -- --  06/17/21 1615 -- -- -- -- -- 100 % -- --  06/17/21 1611 (!) 169/89 -- -- 82 -- -- -- --  06/17/21 1610 -- -- -- -- -- 100 % -- --  06/17/21 1605 -- -- -- -- -- 100 % -- --  06/17/21 1600 (!) 167/82 -- -- 79 -- 100 % -- --  06/17/21 1555 -- -- -- -- -- 100 % -- --  06/17/21 1545 -- -- -- -- -- 100 % -- --  06/17/21 1541 (!) 188/105 -- -- 85 -- -- -- --  06/17/21 1540 -- -- -- -- -- 100 % -- --  06/17/21 1538 (!) 192/106 -- -- 81 -- -- -- --  06/17/21 1535 -- -- -- -- -- 100 % -- --  06/17/21 1530 -- -- -- -- -- 100 % -- --  06/17/21 1525 -- -- -- -- -- 100 % -- --  06/17/21 1523 (!) 194/114 -- -- 77 -- -- -- --  06/17/21 1520 -- -- -- -- -- 99 % -- --  06/17/21 1515 -- -- -- -- -- 100 % -- --  06/17/21 1511 (!) 191/109 -- -- 77 -- -- -- --  06/17/21 1510 -- -- -- -- -- 100 % -- --  06/17/21 1505 -- -- -- -- -- 100 % -- --  06/17/21 1500 -- -- -- -- -- 100 % -- --  06/17/21 1455 -- -- -- -- -- 100 % -- --  06/17/21 1451 (!) 175/107 -- -- 72 -- -- -- --  06/17/21 1450 -- -- -- -- -- 99 % -- --  06/17/21 1445 -- -- -- -- -- 99 % -- --  06/17/21 1440 -- -- -- -- -- 100 % -- --  06/17/21 1435 -- -- -- -- -- 100 % -- --  06/17/21 1430 (!) 196/109 -- -- 75 -- 100 % -- --  06/17/21 1425 -- -- -- -- -- 100 % -- --  06/17/21 1406 (!) 184/102 98.7 F (37.1 C) Oral 72 20 100 % $Rem'5\' 3"'rVYj$  (1.6 m) 96.6 kg    Physical Exam Constitutional:      Appearance: Normal appearance. She is obese.  Cardiovascular:     Rate and Rhythm: Normal rate.  Pulmonary:     Effort: Pulmonary effort is normal.  Abdominal:     Palpations: Abdomen is soft.  Genitourinary:    General: Normal vulva.     Comments: Pelvic exam: External genitalia normal, small amt of thick, white vaginal d/c noted on vulva and  introitus -- WP, GC/CT. fFN done, VE: Dilation: 1, Effacement (%): 50; Uterus is non-tender, S=D. Skin:    General: Skin is warm and dry.  Neurological:     Mental Status: She is alert and oriented to person, place, and time.  Psychiatric:        Mood and Affect: Mood normal.        Behavior: Behavior normal.        Thought Content: Thought content normal.        Judgment: Judgment normal.   REASSURING NST - FHR: 150 bpm / moderate variability / 10 x 10  accels present / variable decels present / TOCO: regular every 1-3 mins   MAU Course  Procedures  MDM CCUA CBC CMP P/C Ratio Serial BP's  Labetalol Protocol Fentanyl 1 mg every 1 hour -- relieved pain  *Consult with Dr. Kennon Rounds @ 1703 - notified of patient's complaints, assessments, lab & NST results, recommended tx plan admit to [facility]  Results for orders placed or performed during the hospital encounter of 06/17/21 (from the past 24 hour(s))  Urinalysis, Routine w reflex microscopic Urine, Clean Catch     Status: Abnormal   Collection Time: 06/17/21  2:34 PM  Result Value Ref Range   Color, Urine YELLOW YELLOW   APPearance HAZY (A) CLEAR   Specific Gravity, Urine 1.006 1.005 - 1.030   pH 7.0 5.0 - 8.0   Glucose, UA NEGATIVE NEGATIVE mg/dL   Hgb urine dipstick NEGATIVE NEGATIVE   Bilirubin Urine NEGATIVE NEGATIVE   Ketones, ur NEGATIVE NEGATIVE mg/dL   Protein, ur 100 (A) NEGATIVE mg/dL   Nitrite NEGATIVE NEGATIVE   Leukocytes,Ua NEGATIVE NEGATIVE   RBC / HPF 0-5 0 - 5 RBC/hpf   WBC, UA 0-5 0 - 5 WBC/hpf   Bacteria, UA RARE (A) NONE SEEN   Squamous Epithelial / LPF 6-10 0 - 5  Protein / creatinine ratio, urine     Status: Abnormal   Collection Time: 06/17/21  2:34 PM  Result Value Ref Range   Creatinine, Urine 41.24 mg/dL   Total Protein, Urine 97 mg/dL   Protein Creatinine Ratio 2.35 (H) 0.00 - 0.15 mg/mg[Cre]  CBC     Status: Abnormal   Collection Time: 06/17/21  3:01 PM  Result Value Ref Range   WBC 6.5 4.0 -  10.5 K/uL   RBC 3.48 (L) 3.87 - 5.11 MIL/uL   Hemoglobin 11.6 (L) 12.0 - 15.0 g/dL   HCT 33.4 (L) 36.0 - 46.0 %   MCV 96.0 80.0 - 100.0 fL   MCH 33.3 26.0 - 34.0 pg   MCHC 34.7 30.0 - 36.0 g/dL   RDW 12.4 11.5 - 15.5 %   Platelets 310 150 - 400 K/uL   nRBC 0.0 0.0 - 0.2 %  Comprehensive metabolic panel     Status: Abnormal   Collection Time: 06/17/21  3:01 PM  Result Value Ref Range   Sodium 135 135 - 145 mmol/L   Potassium 4.3 3.5 - 5.1 mmol/L   Chloride 104 98 - 111 mmol/L   CO2 22 22 - 32 mmol/L   Glucose, Bld 74 70 - 99 mg/dL   BUN 7 6 - 20 mg/dL   Creatinine, Ser 0.76 0.44 - 1.00 mg/dL   Calcium 8.7 (L) 8.9 - 10.3 mg/dL   Total Protein 6.6 6.5 - 8.1 g/dL   Albumin 2.9 (L) 3.5 - 5.0 g/dL   AST 29 15 - 41 U/L   ALT 18 0 - 44 U/L   Alkaline Phosphatase 183 (H) 38 - 126 U/L   Total Bilirubin 0.3 0.3 - 1.2 mg/dL   GFR, Estimated >60 >60 mL/min   Anion gap 9 5 - 15  Wet prep, genital     Status: Abnormal   Collection Time: 06/17/21  3:52 PM  Result Value Ref Range   Yeast Wet Prep HPF POC NONE SEEN NONE SEEN   Trich, Wet Prep NONE SEEN NONE SEEN   Clue Cells Wet Prep HPF POC PRESENT (A) NONE SEEN   WBC, Wet Prep HPF POC MODERATE (A) NONE SEEN   Sperm NONE  SEEN    Assessment and Plan  Chronic hypertension with superimposed pre-eclampsia Type 2 diabetes mellitus affecting pregnancy in third trimester, antepartum Preterm labor in third trimester without delivery [redacted] weeks gestation of pregnancy   - Admit to [facility] - Admission orders placed by Dr. Kennon Rounds - Care assumed by Dr. Kennon Rounds @ [address] [place] 06/17/2021, 3:53 PM

## 2021-06-18 DIAGNOSIS — I10 Essential (primary) hypertension: Secondary | ICD-10-CM | POA: Diagnosis not present

## 2021-06-18 DIAGNOSIS — O119 Pre-existing hypertension with pre-eclampsia, unspecified trimester: Secondary | ICD-10-CM | POA: Diagnosis not present

## 2021-06-18 LAB — GLUCOSE, CAPILLARY
Glucose-Capillary: 33 mg/dL — CL (ref 70–99)
Glucose-Capillary: 99 mg/dL (ref 70–99)
Glucose-Capillary: 109 mg/dL — ABNORMAL HIGH (ref 70–99)
Glucose-Capillary: 96 mg/dL (ref 70–99)
Glucose-Capillary: 102 mg/dL — ABNORMAL HIGH (ref 70–99)

## 2021-06-18 LAB — FETAL FIBRONECTIN: Fetal Fibronectin: NEGATIVE

## 2021-06-18 MED ORDER — INSULIN GLARGINE-YFGN 100 UNIT/ML ~~LOC~~ SOLN
14.0000 [IU] | SUBCUTANEOUS | Status: DC
  Administered 2021-06-18: 14 [IU] via SUBCUTANEOUS
  Filled 2021-06-18 (×3): qty 0.14

## 2021-06-18 MED ORDER — POLYETHYLENE GLYCOL 3350 17 G PO PACK
17.0000 g | PACK | Freq: Two times a day (BID) | ORAL | Status: DC
  Administered 2021-06-18 – 2021-06-19 (×4): 17 g via ORAL
  Filled 2021-06-18 (×6): qty 1

## 2021-06-18 MED ORDER — INSULIN ASPART 100 UNIT/ML IJ SOLN: 6.0000 [IU] | Freq: Three times a day (TID) | INTRAMUSCULAR | Status: DC

## 2021-06-18 NOTE — Progress Notes (Signed)
Patient ID: Misty [name], female   DOB: [birth year], 30 y.o.   MRN: [id number] FACULTY PRACTICE ANTEPARTUM(COMPREHENSIVE) NOTE  Misty [name] is a 30 y.o. G2P0010 with Estimated Date of Delivery: 08/01/21   By  early ultrasound [redacted]w[redacted]d  who is admitted for cHTN with SIPE, severe, BP.    Fetal presentation is cephalic. Length of Stay:  1  Days  Date of admission:06/17/2021  Subjective: No headache or visual changes No complaints Patient reports the fetal movement as active. Patient reports uterine contraction  activity as irregular, every 10 minutes. Patient reports  vaginal bleeding as none. Patient describes fluid per vagina as None.  Vitals:  Blood pressure 130/75, pulse 91, temperature 98.3 F (36.8 C), temperature source Oral, resp. rate 16, height 5\' 3"  (1.6 m), weight 96.6 kg, last menstrual period 10/14/2020, SpO2 99 %. Vitals:   06/17/21 2130 06/17/21 2329 06/18/21 0429 06/18/21 0756  BP: (!) 130/95 131/75 (!) 147/80 130/75  Pulse: 95 81 84 91  Resp:   18 16  Temp:  98.2 F (36.8 C) 98.4 F (36.9 C) 98.3 F (36.8 C)  TempSrc:  Oral Oral Oral  SpO2:  98% 99%   Weight:      Height:       Physical Examination:  General appearance - alert, well appearing, and in no distress Abdomen - soft, nontender, nondistended, no masses or organomegaly Fundal Height:  size equals dates Pelvic Exam:  examination not indicated Cervical Exam: Not evaluated.Extremities: extremities normal, atraumatic, no cyanosis or edema with DTRs 2+ bilaterally Membranes:intact  Fetal Monitoring:  Baseline: 130s bpm, Variability: Good {> 6 bpm), Accelerations: Reactive, and Decelerations: Absent   reactive  Labs:  Results for orders placed or performed during the hospital encounter of 06/17/21 (from the past 24 hour(s))  Urinalysis, Routine w reflex microscopic Urine, Clean Catch   Collection Time: 06/17/21  2:34 PM  Result Value Ref Range   Color, Urine YELLOW YELLOW   APPearance HAZY (A) CLEAR    Specific Gravity, Urine 1.006 1.005 - 1.030   pH 7.0 5.0 - 8.0   Glucose, UA NEGATIVE NEGATIVE mg/dL   Hgb urine dipstick NEGATIVE NEGATIVE   Bilirubin Urine NEGATIVE NEGATIVE   Ketones, ur NEGATIVE NEGATIVE mg/dL   Protein, ur 06/19/21 (A) NEGATIVE mg/dL   Nitrite NEGATIVE NEGATIVE   Leukocytes,Ua NEGATIVE NEGATIVE   RBC / HPF 0-5 0 - 5 RBC/hpf   WBC, UA 0-5 0 - 5 WBC/hpf   Bacteria, UA RARE (A) NONE SEEN   Squamous Epithelial / LPF 6-10 0 - 5  Protein / creatinine ratio, urine   Collection Time: 06/17/21  2:34 PM  Result Value Ref Range   Creatinine, Urine 41.24 mg/dL   Total Protein, Urine 97 mg/dL   Protein Creatinine Ratio 2.35 (H) 0.00 - 0.15 mg/mg[Cre]  CBC   Collection Time: 06/17/21  3:01 PM  Result Value Ref Range   WBC 6.5 4.0 - 10.5 K/uL   RBC 3.48 (L) 3.87 - 5.11 MIL/uL   Hemoglobin 11.6 (L) 12.0 - 15.0 g/dL   HCT 06/19/21 (L) 91.6 - 38.4 %   MCV 96.0 80.0 - 100.0 fL   MCH 33.3 26.0 - 34.0 pg   MCHC 34.7 30.0 - 36.0 g/dL   RDW 66.5 99.3 - 57.0 %   Platelets 310 150 - 400 K/uL   nRBC 0.0 0.0 - 0.2 %  Comprehensive metabolic panel   Collection Time: 06/17/21  3:01 PM  Result Value Ref Range  Sodium 135 135 - 145 mmol/L   Potassium 4.3 3.5 - 5.1 mmol/L   Chloride 104 98 - 111 mmol/L   CO2 22 22 - 32 mmol/L   Glucose, Bld 74 70 - 99 mg/dL   BUN 7 6 - 20 mg/dL   Creatinine, Ser 3.82 0.44 - 1.00 mg/dL   Calcium 8.7 (L) 8.9 - 10.3 mg/dL   Total Protein 6.6 6.5 - 8.1 g/dL   Albumin 2.9 (L) 3.5 - 5.0 g/dL   AST 29 15 - 41 U/L   ALT 18 0 - 44 U/L   Alkaline Phosphatase 183 (H) 38 - 126 U/L   Total Bilirubin 0.3 0.3 - 1.2 mg/dL   GFR, Estimated >50 >53 mL/min   Anion gap 9 5 - 15  Wet prep, genital   Collection Time: 06/17/21  3:52 PM  Result Value Ref Range   Yeast Wet Prep HPF POC NONE SEEN NONE SEEN   Trich, Wet Prep NONE SEEN NONE SEEN   Clue Cells Wet Prep HPF POC PRESENT (A) NONE SEEN   WBC, Wet Prep HPF POC MODERATE (A) NONE SEEN   Sperm NONE SEEN   Resp  Panel by RT-PCR (Flu A&B, Covid) Nasopharyngeal Swab   Collection Time: 06/17/21  6:42 PM   Specimen: Nasopharyngeal Swab; Nasopharyngeal(NP) swabs in vial transport medium  Result Value Ref Range   SARS Coronavirus 2 by RT PCR NEGATIVE NEGATIVE   Influenza A by PCR NEGATIVE NEGATIVE   Influenza B by PCR NEGATIVE NEGATIVE  Glucose, capillary   Collection Time: 06/17/21  9:04 PM  Result Value Ref Range   Glucose-Capillary 45 (L) 70 - 99 mg/dL  Glucose, capillary   Collection Time: 06/17/21  9:21 PM  Result Value Ref Range   Glucose-Capillary 62 (L) 70 - 99 mg/dL  Glucose, capillary   Collection Time: 06/17/21  9:46 PM  Result Value Ref Range   Glucose-Capillary 106 (H) 70 - 99 mg/dL  Glucose, capillary   Collection Time: 06/17/21 11:27 PM  Result Value Ref Range   Glucose-Capillary 121 (H) 70 - 99 mg/dL  Glucose, capillary   Collection Time: 06/18/21  6:07 AM  Result Value Ref Range   Glucose-Capillary 33 (LL) 70 - 99 mg/dL   Comment 1 Notify RN   Glucose, capillary   Collection Time: 06/18/21  6:37 AM  Result Value Ref Range   Glucose-Capillary 99 70 - 99 mg/dL    Imaging Studies:    [place] MFM FETAL BPP W/NONSTRESS  Result Date: 06/14/2021 ----------------------------------------------------------------------  OBSTETRICS REPORT                       (Signed Final 06/14/2021 12:50 pm) ---------------------------------------------------------------------- Patient Info  ID #:       [id number]                          D.O.B.:  Nov 02, 1990 (30 yrs)  Name:       Misty [name]                Visit Date: 06/14/2021 10:57 am ---------------------------------------------------------------------- Performed By  Attending:        Noralee Space MD        Ref. Address:     Faculty  Performed By:     Tommie Raymond BS,       Location[facility]  Referred By:      Gigi Gin                    CONSTANT MD ---------------------------------------------------------------------- Orders  #  Description                           Code        Ordered By  1  [place] MFM FETAL BPP                      95188.4     RAVI [facility]     W/NONSTRESS ----------------------------------------------------------------------  #  Order #                     Accession #                Episode #  1  [id number]                   [id number]                 [id number] ---------------------------------------------------------------------- Indications  [redacted] weeks gestation of pregnancy                Z3A.33  Pre-existing diabetes, type 2, in pregnancy,   O24.113  third trimester (insulin)  Hypertension - Chronic with superimposed       O11.9 O10.919  preeclampsia (nifedipine/labetalol)  Mild to moderate preeclampsia, third           O14.03  trimester  Abnormal biochemical finding on antenatal      O28.1  screening of mother (AFP 4.36)  Encounter for other antenatal screening        Z36.2  follow-up  LR NIPS, Neg Horizon ---------------------------------------------------------------------- Fetal Evaluation  Num Of Fetuses:         1  Fetal Heart Rate(bpm):  145  Cardiac Activity:       Observed  Presentation:           Cephalic  Placenta:               Anterior  P. Cord Insertion:      Previously Visualized  Amniotic Fluid  AFI FV:      Within normal limits  AFI Sum(cm)     %Tile       Largest Pocket(cm)  13.1            41          4.2  RUQ(cm)  RLQ(cm)       LUQ(cm)        LLQ(cm)  3.9           2.7           4.2            2.2 ---------------------------------------------------------------------- Biophysical Evaluation  Amniotic F.V:   Pocket => 2 cm             F. Tone:        Observed  F. Movement:    Observed                   N.S.T:          Reactive  F. Breathing:   Observed                   Score:           10/10 ---------------------------------------------------------------------- OB History  Gravidity:    2          SAB:   1  Living:       0 ---------------------------------------------------------------------- Gestational Age  LMP:           34w 5d        Date:  10/14/20                 EDD:   07/21/21  Best:          33w 1d     Det. ByMarcella Dubs         EDD:   08/01/21                                      (12/05/20) ---------------------------------------------------------------------- Anatomy  Ventricles:            Appears normal         Stomach:                Appears normal, left                                                                        sided  Thoracic:              Appears normal         Kidneys:                Appear normal  Diaphragm:             Appears normal         Bladder:                Appears normal  Other:  Technically difficult due to advanced GA and fetal position. ---------------------------------------------------------------------- Cervix Uterus Adnexa  Cervix  Not visualized (advanced GA >24wks)  Uterus  No abnormality visualized.  Right Ovary  Within normal limits.  Left Ovary  Within normal limits.  Cul De Sac  No free fluid seen.  Adnexa  No abnormality visualized. ---------------------------------------------------------------------- Impression  Amniotic fluid is normal good fetal activity seen.  Antenatal  testing is reassuring.  NST is reactive.  BPP 10/10.  I have reassured the patient of the  findings.  xxxxxxxxxxxxxxxxxxxxxxxxxxxxxxxxxxxxxx  Consultation (see EPIC )  Ms. Erhart returned for ultrasound evaluation today. She is  G2 P0 at 33w 1d gestation and is here for antenatal testing.  Her problems include:  -Chronic hypertension with superimposed preeclampsia  without severe features.  Patient takes labetalol 200 mg twice  daily and nifedipine XL 60 mg daily.  Blood pressure today at  her office is 129/87 mmHg.  Patient was admitted on 05/29/2021 and was  discharged 4  days later.  Admission labs including liver enzymes, platelets  and serum creatinine were within normal range.  Repeat labs  drawn on 06/12/2021 were within normal range.  Patient does not have symptoms of severe features of  preeclampsia.  morning and 18 units at night, and Humalog 8/8/8 units with  each meal.  She reports her fasting levels are below 95  mg/DL and postprandial levels are within normal range.  -Increased to maternal serum alpha-fetoprotein on  midtrimester screening. On previous ultrasound assessment,  fetal growth was appropriate for gestational age.  Our concerns include:  Chronic hypertension with superimposed preeclampsia  Patient had few blood pressures in the severe hypertensive  range on admission that later resolved.  She does not have  symptoms and signs of severe features of preeclampsia.  She has a diagnosis of chronic hypertension with  superimposed preeclampsia.  Type 2 diabetes  Patient reports all her fasting and postprandial levels are  within normal range and that diabetes is well controlled.  I counseled her on the timing of delivery.  Given that she has  super imposed preeclampsia, I recommend delivery at [redacted]  weeks gestation.  However if blood pressures are not well  controlled with current medications or if diabetes is not well  controlled, hospitalization may be considered at [redacted] weeks  gestation.  Alternatively, she may be delivered at [redacted] weeks  gestation.  Patient reports she will be unable to come twice weekly for  antenatal testing.  We made appointments for weekly  antenatal testing. ---------------------------------------------------------------------- Recommendations  -Appointments were made for weekly antenatal testing.  -Patient has blood pressure cuff at home and will be  monitoring her blood pressures.  She understands the  parameters.  -Delivery at [redacted] weeks gestation.  -Inpatient management if blood pressure and/or diabetes is  not well controlled before  [redacted] weeks gestation.  If patient is not  willing for admission, delivery may be considered at [redacted] weeks  gestation.  -Delivery at 34 weeks or at diagnosis if severe features of  preeclampsia are detected. ----------------------------------------------------------------------                  Noralee Space, MD Electronically Signed Final Report   06/14/2021 12:50 pm ----------------------------------------------------------------------    Medications:  Scheduled  aspirin EC  81 mg Oral Daily   docusate sodium  100 mg Oral Daily   famotidine  10 mg Oral BID   insulin aspart  0-16 Units Subcutaneous TID PC   insulin aspart  8 Units Subcutaneous TID WC   insulin glargine-yfgn  18 Units Subcutaneous BH-qamhs   labetalol  300 mg Oral TID   NIFEdipine  60 mg Oral BID   polyethylene glycol  17 g Oral BID   prenatal multivitamin  1 tablet Oral Q1200   scopolamine  1 patch Transdermal Q72H   sodium chloride flush  3 mL Intravenous Q12H   I have reviewed the patient's current medications.  ASSESSMENT: G2P0010 [redacted]w[redacted]d Estimated Date of Delivery: 08/01/21  ICD-10-CM   1. Chronic hypertension with superimposed pre-eclampsia  O11.9     2. Type 2 diabetes mellitus affecting pregnancy in third trimester, antepartum  O24.113     3. Preterm labor in third trimester without delivery  O60.03     4. [redacted] weeks gestation of pregnancy  Z3A.33       Patient Active Problem List   Diagnosis Date Noted   Chronic hypertension 06/17/2021   Anemia in pregnancy, third trimester 05/30/2021   Chronic hypertension with superimposed pre-eclampsia 05/26/2021   Marijuana use 05/24/2021   Abnormal antenatal AFP screen 02/17/2021   Type 2 diabetes mellitus complicating pregnancy in second trimester, antepartum 01/03/2021   Supervision of high risk pregnancy, antepartum 12/27/2020   Chronic hypertension affecting pregnancy 09/2013    PLAN: Twice daily fetal monitoring On going management based on her clinical  course  Plan IOL at [redacted]w[redacted]d, 06/20/21  Amaryllis Dyke Icela Glymph 06/18/2021,9:13 AM

## 2021-06-18 NOTE — Progress Notes (Signed)
Inpatient Diabetes Program Recommendations  AACE/ADA: New Consensus Statement on Inpatient Glycemic Control (2015)  Target Ranges:  Prepandial:   less than 140 mg/dL      Peak postprandial:   less than 180 mg/dL (1-2 hours)      Critically ill patients:  140 - 180 mg/dL   Lab Results  Component Value Date   GLUCAP 109 (H) 06/18/2021   HGBA1C 7.0 (A) 03/09/2021    Review of Glycemic Control Results for Misty [name], Misty [name] (MRN [id number]) as of 06/18/2021 13:31  Ref. Range 06/17/2021 21:04 06/17/2021 21:21 06/17/2021 21:46 06/17/2021 23:27 06/18/2021 06:07 06/18/2021 06:37 06/18/2021 09:53  Glucose-Capillary Latest Ref Range: 70 - 99 mg/dL 45 (L) 62 (L) 520 (H) 802 (H) 33 (LL) 99 109 (H)   Diabetes history: DM 2 Outpatient Diabetes medications:  Novolog 8 units q AM, Lantus 18 units bid Current orders for Inpatient glycemic control:  Lantus 18 units bid, Novolog 8 units tid with meals  Inpatient Diabetes Program Recommendations:    Patient is not wearing omnipod at this time.  She states that she was unable to get to her appointments. She is currently taking Lantus and Novolog at home.  She is not using Dexcom either and and is just doing fingersticks.  She states she was not having lows at home but knows when her blood sugars are dropping.  Recommend reducing Lantus to 14 units bid.  Also please hold Novolog meal coverage for now and add Novolog 0-14 units fasting and 2 hours post-prandial per diabetes and pregnancy order set. Will follow patient while in the hospital.   Thanks,  Beryl Meager, RN, BC-ADM Inpatient Diabetes Coordinator Pager [phone]  (8a-5p)

## 2021-06-18 NOTE — Progress Notes (Signed)
Pt reports her mouth numbness is going away now that her blood glucose is coming up.

## 2021-06-18 NOTE — Progress Notes (Signed)
Pt c/o of mouth being numb blood glucose checked 33 given juice.  Repeated blood glucose 99 after drinking juice.  Will continue to monitor.

## 2021-06-19 DIAGNOSIS — O119 Pre-existing hypertension with pre-eclampsia, unspecified trimester: Secondary | ICD-10-CM | POA: Diagnosis not present

## 2021-06-19 LAB — GLUCOSE, CAPILLARY
Glucose-Capillary: 100 mg/dL — ABNORMAL HIGH (ref 70–99)
Glucose-Capillary: 112 mg/dL — ABNORMAL HIGH (ref 70–99)
Glucose-Capillary: 117 mg/dL — ABNORMAL HIGH (ref 70–99)
Glucose-Capillary: 37 mg/dL — CL (ref 70–99)
Glucose-Capillary: 52 mg/dL — ABNORMAL LOW (ref 70–99)
Glucose-Capillary: 66 mg/dL — ABNORMAL LOW (ref 70–99)
Glucose-Capillary: 68 mg/dL — ABNORMAL LOW (ref 70–99)
Glucose-Capillary: 69 mg/dL — ABNORMAL LOW (ref 70–99)
Glucose-Capillary: 81 mg/dL (ref 70–99)
Glucose-Capillary: 93 mg/dL (ref 70–99)

## 2021-06-19 LAB — GC/CHLAMYDIA PROBE AMP (~~LOC~~) NOT AT ARMC
Chlamydia: NEGATIVE
Comment: NEGATIVE
Comment: NORMAL
Neisseria Gonorrhea: NEGATIVE

## 2021-06-19 MED ORDER — BISACODYL 10 MG RE SUPP
10.0000 mg | Freq: Every day | RECTAL | Status: DC | PRN
  Administered 2021-06-19: 10 mg via RECTAL
  Filled 2021-06-19 (×2): qty 1

## 2021-06-19 MED ORDER — INSULIN GLARGINE-YFGN 100 UNIT/ML ~~LOC~~ SOLN
10.0000 [IU] | SUBCUTANEOUS | Status: DC
  Administered 2021-06-19: 10 [IU] via SUBCUTANEOUS
  Filled 2021-06-19 (×2): qty 0.1

## 2021-06-19 MED ORDER — FLEET ENEMA 7-19 GM/118ML RE ENEM: 1.0000 | ENEMA | Freq: Once | RECTAL | Status: DC

## 2021-06-19 MED ORDER — INSULIN GLARGINE-YFGN 100 UNIT/ML ~~LOC~~ SOLN
10.0000 [IU] | Freq: Every day | SUBCUTANEOUS | Status: DC
  Filled 2021-06-19 (×2): qty 0.1

## 2021-06-19 NOTE — Progress Notes (Signed)
Hypoglycemic Event  CBG: 69  Treatment: 4 oz juice/soda  Symptoms: None  Follow-up CBG: Time:1745 CBG Result:112  Possible Reasons for Event: Inadequate meal intake  Comments/MD notified:Dr. Louanne Skye, Karmen Bongo

## 2021-06-19 NOTE — Progress Notes (Signed)
Inpatient Diabetes Program Recommendations  AACE/ADA: New Consensus Statement on Inpatient Glycemic Control (2015)  Target Ranges:  Prepandial:   less than 140 mg/dL      Peak postprandial:   less than 180 mg/dL (1-2 hours)      Critically ill patients:  140 - 180 mg/dL   Lab Results  Component Value Date   GLUCAP 100 (H) 06/19/2021   HGBA1C 7.0 (A) 03/09/2021    Review of Glycemic Control Results for Misty [name], Misty [name] (MRN [id number]) as of 06/19/2021 08:56  Ref. Range 06/19/2021 00:56 06/19/2021 01:39 06/19/2021 06:56 06/19/2021 07:14 06/19/2021 07:44  Glucose-Capillary Latest Ref Range: 70 - 99 mg/dL 66 (L) 750 (H) 37 (LL) 68 (L) 100 (H)   Diabetes history: DM 2 Outpatient Diabetes medications:  Novolog 8 units TID, Lantus 18 units bid Current orders for Inpatient glycemic control: Semglee 10 units QHS,   Inpatient Diabetes Program Recommendations:    Noted multiple episodes of hypoglycemia and subsequent reductions to insulin. Unsure reason for persistent hypos given lack of lows at home on similar outpatient doses.  With IOL scheduled, would recommend adding Novolog 0-14 units Q4H beginning at scheduled induction time and discontinuing Semglee for QHS dose.   Thanks, Lujean Rave, MSN, RNC-OB Diabetes Coordinator [phone] (8a-5p)

## 2021-06-19 NOTE — Plan of Care (Signed)
  Problem: Education: Goal: Knowledge of disease or condition will improve Outcome: Progressing Goal: Individualized Educational Video(s) Outcome: Progressing   Problem: Clinical Measurements: Goal: Complications related to the disease process, condition or treatment will be avoided or minimized Outcome: Progressing   Problem: Education: Goal: Knowledge of disease or condition will improve Outcome: Progressing Goal: Knowledge of the prescribed therapeutic regimen will improve Outcome: Progressing   Problem: Fluid Volume: Goal: Peripheral tissue perfusion will improve Outcome: Progressing   Problem: Clinical Measurements: Goal: Complications related to disease process, condition or treatment will be avoided or minimized Outcome: Progressing

## 2021-06-19 NOTE — Progress Notes (Signed)
Patient ID: Misty [name], female   DOB: March 18, 1991, 30 y.o.   MRN: [id number] FACULTY PRACTICE ANTEPARTUM(COMPREHENSIVE) NOTE  KIERAN NACHTIGAL is a 30 y.o. G2P0010 with Estimated Date of Delivery: 08/01/21   By  early ultrasound [redacted]w[redacted]d  who is admitted for [facility] w/SIPE, severe features, BP.    Fetal presentation is cephalic. Length of Stay:  2  Days  Date of admission:06/17/2021  Subjective: No complaints Patient reports the fetal movement as active. Patient reports uterine contraction  activity as none. Patient reports  vaginal bleeding as none. Patient describes fluid per vagina as None.  Vitals:  Blood pressure (!) 154/89, pulse 77, temperature 97.8 F (36.6 C), temperature source Oral, resp. rate 18, height 5\' 3"  (1.6 m), weight 96.6 kg, last menstrual period 10/14/2020, SpO2 100 %. Vitals:   06/19/21 0025 06/19/21 0429 06/19/21 0430 06/19/21 0713  BP: (!) 150/73  134/85 (!) 154/89  Pulse: 72  87 77  Resp: 18  18 18   Temp: 98.7 F (37.1 C)  98.5 F (36.9 C) 97.8 F (36.6 C)  TempSrc: Oral  Oral Oral  SpO2: 99% 100% 100% 100%  Weight:      Height:       Physical Examination:  General appearance - alert, well appearing, and in no distress Abdomen - soft, nontender, nondistended, no masses or organomegaly Fundal Height:  size equals dates Pelvic Exam:  examination not indicated Cervical Exam: Not evaluated. Extremities: extremities normal, atraumatic, no cyanosis or edema with DTRs 2+ bilaterally Membranes:intact  Fetal Monitoring:  Baseline: 140s bpm, Variability: Good {> 6 bpm), Accelerations: Reactive, and Decelerations: Absent   reactive  Labs:  Results for orders placed or performed during the hospital encounter of 06/17/21 (from the past 24 hour(s))  Glucose, capillary   Collection Time: 06/18/21  9:53 AM  Result Value Ref Range   Glucose-Capillary 109 (H) 70 - 99 mg/dL  Glucose, capillary   Collection Time: 06/18/21  3:23 PM  Result Value Ref Range    Glucose-Capillary 96 70 - 99 mg/dL  Glucose, capillary   Collection Time: 06/18/21  9:31 PM  Result Value Ref Range   Glucose-Capillary 102 (H) 70 - 99 mg/dL  Glucose, capillary   Collection Time: 06/19/21 12:56 AM  Result Value Ref Range   Glucose-Capillary 66 (L) 70 - 99 mg/dL  Glucose, capillary   Collection Time: 06/19/21  1:39 AM  Result Value Ref Range   Glucose-Capillary 117 (H) 70 - 99 mg/dL  Glucose, capillary   Collection Time: 06/19/21  6:56 AM  Result Value Ref Range   Glucose-Capillary 37 (LL) 70 - 99 mg/dL   Comment 1 Notify RN   Glucose, capillary   Collection Time: 06/19/21  7:14 AM  Result Value Ref Range   Glucose-Capillary 68 (L) 70 - 99 mg/dL  Glucose, capillary   Collection Time: 06/19/21  7:44 AM  Result Value Ref Range   Glucose-Capillary 100 (H) 70 - 99 mg/dL  Glucose, capillary   Collection Time: 06/19/21  9:25 AM  Result Value Ref Range   Glucose-Capillary 81 70 - 99 mg/dL    Imaging Studies:    No results found.   Medications:  Scheduled  aspirin EC  81 mg Oral Daily   docusate sodium  100 mg Oral Daily   famotidine  10 mg Oral BID   insulin glargine-yfgn  10 Units Subcutaneous QHS   labetalol  300 mg Oral TID   NIFEdipine  60 mg Oral BID   polyethylene  glycol  17 g Oral BID   prenatal multivitamin  1 tablet Oral Q1200   scopolamine  1 patch Transdermal Q72H   sodium chloride flush  3 mL Intravenous Q12H   I have reviewed the patient's current medications.  ASSESSMENT: G2P0010 [redacted]w[redacted]d Estimated Date of Delivery: 08/01/21  Patient Active Problem List   Diagnosis Date Noted   Chronic hypertension 06/17/2021   Anemia in pregnancy, third trimester 05/30/2021   Chronic hypertension with superimposed pre-eclampsia 05/26/2021   Marijuana use 05/24/2021   Abnormal antenatal AFP screen 02/17/2021   Type 2 diabetes mellitus complicating pregnancy in second trimester, antepartum 01/03/2021   Supervision of high risk pregnancy, antepartum  12/27/2020   Chronic hypertension affecting pregnancy 09/2013    PLAN: >BP stable on procardia XL 60 BID + labetalol 300 mg TID >IOL scheduled for tomorrow >Semglee(Lantus) to be given at bedtime only and novolog scale in labor tomorrow  Amaryllis Dyke Herbert Marken 06/19/2021,9:42 AM

## 2021-06-19 NOTE — Progress Notes (Signed)
Hypoglycemic Event  CBG: 52  Treatment: 4 oz juice/soda  Symptoms: None  Follow-up CBG: Time:1720 CBG Result:69  Possible Reasons for Event: Inadequate meal intake  Comments/MD notified:Dr. Louanne Skye, Karmen Bongo

## 2021-06-20 ENCOUNTER — Inpatient Hospital Stay (HOSPITAL_COMMUNITY): Payer: 59 | Attending: Family Medicine

## 2021-06-20 ENCOUNTER — Inpatient Hospital Stay (HOSPITAL_COMMUNITY): Admission: AD | Admit: 2021-06-20 | Payer: 59 | Source: Home / Self Care | Admitting: Family Medicine

## 2021-06-20 LAB — COMPREHENSIVE METABOLIC PANEL
ALT: 15 U/L (ref 0–44)
AST: 22 U/L (ref 15–41)
Albumin: 2.8 g/dL — ABNORMAL LOW (ref 3.5–5.0)
Alkaline Phosphatase: 194 U/L — ABNORMAL HIGH (ref 38–126)
Anion gap: 9 (ref 5–15)
BUN: 6 mg/dL (ref 6–20)
CO2: 23 mmol/L (ref 22–32)
Calcium: 8.9 mg/dL (ref 8.9–10.3)
Chloride: 99 mmol/L (ref 98–111)
Creatinine, Ser: 0.89 mg/dL (ref 0.44–1.00)
GFR, Estimated: >60 mL/min (ref >60)
Glucose, Bld: 76 mg/dL (ref 70–99)
Potassium: 3.9 mmol/L (ref 3.5–5.1)
Sodium: 131 mmol/L — ABNORMAL LOW (ref 135–145)
Total Bilirubin: 0.4 mg/dL (ref 0.3–1.2)
Total Protein: 6.6 g/dL (ref 6.5–8.1)

## 2021-06-20 LAB — CBC
HCT: 30.9 % — ABNORMAL LOW (ref 36.0–46.0)
Hemoglobin: 10.7 g/dL — ABNORMAL LOW (ref 12.0–15.0)
MCH: 33.6 pg (ref 26.0–34.0)
MCHC: 34.6 g/dL (ref 30.0–36.0)
MCV: 97.2 fL (ref 80.0–100.0)
Platelets: 280 10*3/uL (ref 150–400)
RBC: 3.18 MIL/uL — ABNORMAL LOW (ref 3.87–5.11)
RDW: 12.4 % (ref 11.5–15.5)
WBC: 5.5 10*3/uL (ref 4.0–10.5)
nRBC: 0 % (ref 0.0–0.2)

## 2021-06-20 LAB — TYPE AND SCREEN
ABO/RH(D): O POS
Antibody Screen: NEGATIVE

## 2021-06-20 LAB — GLUCOSE, CAPILLARY
Glucose-Capillary: 119 mg/dL — ABNORMAL HIGH (ref 70–99)
Glucose-Capillary: 193 mg/dL — ABNORMAL HIGH (ref 70–99)
Glucose-Capillary: 80 mg/dL (ref 70–99)
Glucose-Capillary: 79 mg/dL (ref 70–99)

## 2021-06-20 LAB — RPR: RPR Ser Ql: NONREACTIVE

## 2021-06-20 MED ORDER — MAGNESIUM SULFATE 40 GM/1000ML IV SOLN
2.0000 g/h | INTRAVENOUS | Status: DC
  Administered 2021-06-20 – 2021-06-21 (×2): 2 g/h via INTRAVENOUS
  Administered 2021-06-22: 1 g/h via INTRAVENOUS
  Filled 2021-06-20 (×3): qty 1000

## 2021-06-20 MED ORDER — EPHEDRINE 5 MG/ML INJ: 10.0000 mg | INTRAVENOUS | Status: DC | PRN

## 2021-06-20 MED ORDER — LABETALOL HCL 5 MG/ML IV SOLN
40.0000 mg | INTRAVENOUS | Status: DC | PRN
  Administered 2021-06-20 – 2021-06-22 (×2): 40 mg via INTRAVENOUS
  Filled 2021-06-20 (×2): qty 8

## 2021-06-20 MED ORDER — FENTANYL CITRATE (PF) 100 MCG/2ML IJ SOLN
50.0000 ug | INTRAMUSCULAR | Status: DC | PRN
  Administered 2021-06-20 – 2021-06-21 (×6): 100 ug via INTRAVENOUS
  Filled 2021-06-20 (×6): qty 2

## 2021-06-20 MED ORDER — HYDRALAZINE HCL 20 MG/ML IJ SOLN
10.0000 mg | INTRAMUSCULAR | Status: DC | PRN
  Administered 2021-06-22: 10 mg via INTRAVENOUS
  Filled 2021-06-20: qty 1

## 2021-06-20 MED ORDER — LABETALOL HCL 200 MG PO TABS
300.0000 mg | ORAL_TABLET | Freq: Three times a day (TID) | ORAL | Status: DC
  Administered 2021-06-20: 300 mg via ORAL
  Filled 2021-06-20: qty 1

## 2021-06-20 MED ORDER — INSULIN ASPART 100 UNIT/ML IJ SOLN
0.0000 [IU] | INTRAMUSCULAR | Status: DC
Start: 2021-06-20 — End: 2021-06-22
  Administered 2021-06-20: 3 [IU] via SUBCUTANEOUS
  Administered 2021-06-21 – 2021-06-22 (×6): 1 [IU] via SUBCUTANEOUS
  Administered 2021-06-22: 2 [IU] via SUBCUTANEOUS

## 2021-06-20 MED ORDER — FENTANYL-BUPIVACAINE-NACL 0.5-0.125-0.9 MG/250ML-% EP SOLN
12.0000 mL/h | EPIDURAL | Status: DC | PRN
  Administered 2021-06-21 – 2021-06-22 (×2): 12 mL/h via EPIDURAL
  Filled 2021-06-20 (×2): qty 250

## 2021-06-20 MED ORDER — PHENYLEPHRINE 40 MCG/ML (10ML) SYRINGE FOR IV PUSH (FOR BLOOD PRESSURE SUPPORT)
80.0000 ug | PREFILLED_SYRINGE | INTRAVENOUS | Status: DC | PRN
  Filled 2021-06-20: qty 10

## 2021-06-20 MED ORDER — LACTATED RINGERS IV SOLN: INTRAVENOUS | Status: DC

## 2021-06-20 MED ORDER — LABETALOL HCL 5 MG/ML IV SOLN
80.0000 mg | INTRAVENOUS | Status: DC | PRN
  Administered 2021-06-22: 80 mg via INTRAVENOUS
  Filled 2021-06-20: qty 16

## 2021-06-20 MED ORDER — SODIUM CHLORIDE 0.9 % IV SOLN
5.0000 10*6.[IU] | Freq: Once | INTRAVENOUS | Status: AC
  Administered 2021-06-20: 5 10*6.[IU] via INTRAVENOUS
  Filled 2021-06-20: qty 5

## 2021-06-20 MED ORDER — MISOPROSTOL 50MCG HALF TABLET
50.0000 ug | ORAL_TABLET | ORAL | Status: DC | PRN
  Administered 2021-06-20: 50 ug via BUCCAL
  Filled 2021-06-20 (×3): qty 1

## 2021-06-20 MED ORDER — MISOPROSTOL 25 MCG QUARTER TABLET
25.0000 ug | ORAL_TABLET | ORAL | Status: DC | PRN
  Administered 2021-06-20: 25 ug via VAGINAL

## 2021-06-20 MED ORDER — OXYTOCIN-SODIUM CHLORIDE 30-0.9 UT/500ML-% IV SOLN
1.0000 m[IU]/min | INTRAVENOUS | Status: DC
  Administered 2021-06-20: 2 m[IU]/min via INTRAVENOUS

## 2021-06-20 MED ORDER — ACETAMINOPHEN 325 MG PO TABS: 650.0000 mg | ORAL_TABLET | ORAL | Status: DC | PRN

## 2021-06-20 MED ORDER — LABETALOL HCL 200 MG PO TABS
300.0000 mg | ORAL_TABLET | Freq: Three times a day (TID) | ORAL | Status: DC
  Administered 2021-06-20 – 2021-06-22 (×6): 300 mg via ORAL
  Filled 2021-06-20 (×6): qty 1

## 2021-06-20 MED ORDER — LACTATED RINGERS IV SOLN
500.0000 mL | INTRAVENOUS | Status: DC | PRN
  Administered 2021-06-21: 250 mL via INTRAVENOUS

## 2021-06-20 MED ORDER — ONDANSETRON HCL 4 MG/2ML IJ SOLN
4.0000 mg | Freq: Four times a day (QID) | INTRAMUSCULAR | Status: DC | PRN
  Administered 2021-06-20 – 2021-06-21 (×5): 4 mg via INTRAVENOUS
  Filled 2021-06-20 (×5): qty 2

## 2021-06-20 MED ORDER — PENICILLIN G POT IN DEXTROSE 60000 UNIT/ML IV SOLN
3.0000 10*6.[IU] | INTRAVENOUS | Status: DC
Start: 2021-06-20 — End: 2021-06-22
  Administered 2021-06-20 – 2021-06-22 (×10): 3 10*6.[IU] via INTRAVENOUS
  Filled 2021-06-20 (×10): qty 50

## 2021-06-20 MED ORDER — DIPHENHYDRAMINE HCL 50 MG/ML IJ SOLN: 12.5000 mg | INTRAMUSCULAR | Status: DC | PRN

## 2021-06-20 MED ORDER — MAGNESIUM SULFATE BOLUS VIA INFUSION
4.0000 g | Freq: Once | INTRAVENOUS | Status: AC
  Administered 2021-06-20: 4 g via INTRAVENOUS
  Filled 2021-06-20: qty 1000

## 2021-06-20 MED ORDER — LACTATED RINGERS IV SOLN
500.0000 mL | Freq: Once | INTRAVENOUS | Status: AC
  Administered 2021-06-21: 300 mL via INTRAVENOUS

## 2021-06-20 MED ORDER — TERBUTALINE SULFATE 1 MG/ML IJ SOLN: 0.2500 mg | Freq: Once | INTRAMUSCULAR | Status: DC | PRN

## 2021-06-20 MED ORDER — OXYTOCIN-SODIUM CHLORIDE 30-0.9 UT/500ML-% IV SOLN
2.5000 [IU]/h | INTRAVENOUS | Status: DC
  Filled 2021-06-20 (×2): qty 500

## 2021-06-20 MED ORDER — MISOPROSTOL 25 MCG QUARTER TABLET
ORAL_TABLET | ORAL | Status: AC
  Filled 2021-06-20: qty 1

## 2021-06-20 MED ORDER — OXYTOCIN BOLUS FROM INFUSION
333.0000 mL | Freq: Once | INTRAVENOUS | Status: AC
  Administered 2021-06-22: 333 mL via INTRAVENOUS

## 2021-06-20 MED ORDER — LABETALOL HCL 5 MG/ML IV SOLN
INTRAVENOUS | Status: AC
  Filled 2021-06-20: qty 4

## 2021-06-20 MED ORDER — PHENYLEPHRINE 40 MCG/ML (10ML) SYRINGE FOR IV PUSH (FOR BLOOD PRESSURE SUPPORT): 80.0000 ug | PREFILLED_SYRINGE | INTRAVENOUS | Status: DC | PRN

## 2021-06-20 MED ORDER — LABETALOL HCL 5 MG/ML IV SOLN
20.0000 mg | INTRAVENOUS | Status: DC | PRN
  Administered 2021-06-20 – 2021-06-22 (×2): 20 mg via INTRAVENOUS
  Filled 2021-06-20: qty 4

## 2021-06-20 MED ORDER — LIDOCAINE HCL (PF) 1 % IJ SOLN: 30.0000 mL | INTRAMUSCULAR | Status: DC | PRN

## 2021-06-20 MED ORDER — SOD CITRATE-CITRIC ACID 500-334 MG/5ML PO SOLN: 30.0000 mL | ORAL | Status: DC | PRN

## 2021-06-20 NOTE — Progress Notes (Signed)
Labor Progress Note Misty [name] is a 30 y.o. G2P0010 at [redacted]w[redacted]d who presented for IOL due to [facility] with SIPE and T2DM.  S: Doing well. No concerns at this time.  O:  BP (!) 154/94   Pulse 73   Temp 98.7 F (37.1 C) (Oral)   Resp 18   Ht 5\' 3"  (1.6 m)   Wt 96.6 kg   LMP 10/14/2020   SpO2 100%   BMI 37.71 kg/m   EFM: Baseline 145 bpm, moderate variability, no accels, no decels   CVE: Dilation: (P) 1 Effacement (%): 50 Station: (P) -3 Presentation: (P) Vertex Exam by:: [email or web address]) Bradrick Kamau  A&P: 30 y.o. G2P0010 [redacted]w[redacted]d   #Labor: SVE 1/thick/-3. Attempted FB placement but cervix too posterior this check. Will start with buccal Cytotec x1 and reassess in 4 hours. Will place FB when able.  #Pain: PRN #FWB: Cat 1  #GBS unknown - PCN ordered. Culture pending.  #T2DM: CBGs within normal range. Will continue Q4hr glucose checks with SSI PRN.  #CHTN with SIPE: Received IV Labetalol x3 this AM. Received AM dose of PO Labetalol at 10 AM but not her Procardia AM dose. RN to give Procardia at this time and will monitor pressures thereafter. No symptoms at this time. Will continue to monitor closely and adjust regimen as needed.   [redacted]w[redacted]d, MD 12:01 PM

## 2021-06-20 NOTE — Consult Note (Signed)
[facility]  Prenatal Consult       06/20/2021  5:44 PM   I was asked by Dr. Mathis Fare to consult on this patient for possible preterm delivery. I had the pleasure of meeting with Ms. Hardman today. She is a 30 year old G2P0 at 84 weeks who is being induced for cHTN with superimposed pre-eclampsia. Pregnancy also complicated by T2DM. Expecting a baby girl, "Amiyah." Of note, FOB is not involved.  I explained that the neonatal intensive care team would be present for the delivery and outlined the likely delivery room course for this baby including routine resuscitation and NRP-guided approaches to the treatment of respiratory distress. We discussed other common problems associated with prematurity including respiratory distress syndrome/CLD, apnea, feeding issues, temperature regulation.   We discussed the average length of stay but I noted that the actual LOS would depend on the severity of problems encountered and response to treatments. We discussed visitation policies and the resources available while her child is in the hospital.  We discussed the importance of good nutrition and various methods of providing nutrition (parenteral hyperalimentation, gavage feedings and/or oral feeding). We discussed the benefits of human milk. I encouraged breast feeding and pumping soon after birth and outlined resources that are available to support breast feeding. We discussed the possibility of using donor breast milk as a bridge which she will consider.  Thank you for involving [place] in the care of this patient. A member of our team will be available should the family have additional questions. Time for consultation approximately 20 minutes.  Jacob Moores, MD Neonatal Medicine

## 2021-06-20 NOTE — Plan of Care (Signed)
  Problem: Nutrition: Goal: Adequate nutrition will be maintained Outcome: Progressing   Problem: Education: Goal: Knowledge of disease or condition will improve Outcome: Completed/Met   Problem: Education: Goal: Knowledge of disease or condition will improve Outcome: Completed/Met   Problem: Education: Goal: Knowledge of General Education information will improve Description: Including pain rating scale, medication(s)/side effects and non-pharmacologic comfort measures Outcome: Completed/Met   Problem: Activity: Goal: Risk for activity intolerance will decrease Outcome: Completed/Met   Problem: Coping: Goal: Level of anxiety will decrease Outcome: Completed/Met   Problem: Elimination: Goal: Will not experience complications related to urinary retention Outcome: Completed/Met   Problem: Clinical Measurements: Goal: Respiratory complications will improve Outcome: Not Applicable

## 2021-06-20 NOTE — Progress Notes (Signed)
Labor Progress Note Misty [name] is a 30 y.o. G2P0010 at [redacted]w[redacted]d who presented for IOL due to [facility] with SIPE and T2DM.  S: Doing well, resting comfortably, no concerns.   O:  BP (!) 160/101   Pulse 71   Temp 98 F (36.7 C) (Oral)   Resp 18   Ht 5\' 3"  (1.6 m)   Wt 96.6 kg   LMP 10/14/2020   SpO2 100%   BMI 37.71 kg/m   EFM: Baseline 135 bpm, moderate variability, + accels, no decels   CVE: Dilation: 2 Effacement (%): 50 Cervical Position: Posterior Station: -3 Presentation: Vertex Exam by:: Dr. [email or web address]   A&P: 30 y.o. G2P0010 [redacted]w[redacted]d   #Labor: Progressing well. Foley balloon placed this check. Mom and baby tolerated this well. Additional dose of vaginal Cytotec also given. Will reassess in 4 hours.  #Pain: PRN #FWB: Cat 1  #GBS unknown - PCN ordered; culture pending  #CHTN with SIPE: Multiple severe range pressures today requiring PRN Labetalol. Remains asymptomatic otherwise. Magnesium ordered. Will continue Procardia 60 mg BID and Labetalol 300 mg TID. Will continue to monitory closely and adjust regimen as necessary.  #T2DM: Received SSI for CBG 193, which came down to 80. Will continue Q4hr glucose checks with SSI while in labor.   [redacted]w[redacted]d, MD 5:27 PM

## 2021-06-20 NOTE — Progress Notes (Signed)
Inpatient Diabetes Program Recommendations  Diabetes Treatment Program Recommendations  ADA Standards of Care 2018 Diabetes in Pregnancy Target Glucose Ranges:  Fasting: 60 - 90 mg/dL Preprandial: 60 - 295 mg/dL 1 hr postprandial: Less than 140mg /dL (from first bite of meal) 2 hr postprandial: Less than 120 mg/dL (from first bite of meal)      Lab Results  Component Value Date   GLUCAP 93 06/19/2021   HGBA1C 7.0 (A) 03/09/2021    Review of Glycemic Control Results for [name], [name] (MRN [name]) as of 06/20/2021 08:29  Ref. Range 06/19/2021 16:48 06/19/2021 17:20 06/19/2021 17:45 06/19/2021 21:35  Glucose-Capillary Latest Ref Range: 70 - 99 mg/dL 52 (L) 69 (L) 06/21/2021 (H) 93   Diabetes history: DM 2 Outpatient Diabetes medications:  Novolog 8 units TID, Lantus 18 units bid Current orders for Inpatient glycemic control: none   Inpatient Diabetes Program Recommendations:     With IOL, recommend adding CBGs Q4H.   Thanks, 102, MSN, RNC-OB Diabetes Coordinator [phone] (8a-5p)

## 2021-06-20 NOTE — Progress Notes (Addendum)
Misty [name] is a 30 y.o. G2P0010 at [redacted]w[redacted]d  admitted for IOL for cHTN w/ SIPE (with severe features by BP) and DMII  Subjective: Reports feeling contractions more since balloon came out. Would like some IV pain medications  Objective: BP (!) 151/97   Pulse 79   Temp 97.8 F (36.6 C) (Oral)   Resp 18   Ht 5\' 3"  (1.6 m)   Wt 96.6 kg   LMP 10/14/2020   SpO2 100%   BMI 37.71 kg/m  I/O last 3 completed shifts: In: 1061.2 [P.O.:120; I.V.:841.2; IV Piggyback:100] Out: 0  Total I/O In: 352.6 [P.O.:60; I.V.:292.6] Out: 500 [Urine:500]  FHT:  FHR: 130 bpm, variability: moderate,  accelerations:  Present,  decelerations:  Absent UC:   regular, every 2-3 minutes SVE:   Dilation: 4 Effacement (%): 60 Station: -3, -2 Exam by:: Dr. [email or web address]  Labs: Lab Results  Component Value Date   WBC 5.5 06/20/2021   HGB 10.7 (L) 06/20/2021   HCT 30.9 (L) 06/20/2021   MCV 97.2 06/20/2021   PLT 280 06/20/2021    Assessment / Plan: Induction of labor due to  cHTN w/ SIPE (with severe features by BP), foley balloon out, will start pitocin (2x2)   Fetal Wellbeing:  Category I Pain Control:  IV pain meds I/D:   GBS unknown, PCN ordered. Culture pendings   #cHTN with SIPEw/SF (by BP):  last needed IV Labetalol at 1731, currently on Mg, denies neurological symptoms. - continue Procardia 60BID and Labetalol 300 TID  #DMII Q4hr CBG in labor. Last BG 76 at 2030. Will continue monitoring and SSI ordered in case above goal.   06/22/2021 06/20/2021, 10:27 PM

## 2021-06-21 ENCOUNTER — Inpatient Hospital Stay (HOSPITAL_COMMUNITY): Payer: 59 | Admitting: Anesthesiology

## 2021-06-21 ENCOUNTER — Ambulatory Visit: Payer: 59

## 2021-06-21 LAB — COMPREHENSIVE METABOLIC PANEL
ALT: 15 U/L (ref 0–44)
ALT: 14 U/L (ref 0–44)
AST: 24 U/L (ref 15–41)
AST: 23 U/L (ref 15–41)
Albumin: 2.8 g/dL — ABNORMAL LOW (ref 3.5–5.0)
Albumin: 2.7 g/dL — ABNORMAL LOW (ref 3.5–5.0)
Alkaline Phosphatase: 212 U/L — ABNORMAL HIGH (ref 38–126)
Alkaline Phosphatase: 209 U/L — ABNORMAL HIGH (ref 38–126)
Anion gap: 10 (ref 5–15)
Anion gap: 13 (ref 5–15)
BUN: 5 mg/dL — ABNORMAL LOW (ref 6–20)
BUN: 10 mg/dL (ref 6–20)
CO2: 21 mmol/L — ABNORMAL LOW (ref 22–32)
CO2: 20 mmol/L — ABNORMAL LOW (ref 22–32)
Calcium: 8.4 mg/dL — ABNORMAL LOW (ref 8.9–10.3)
Calcium: 8.2 mg/dL — ABNORMAL LOW (ref 8.9–10.3)
Chloride: 98 mmol/L (ref 98–111)
Chloride: 95 mmol/L — ABNORMAL LOW (ref 98–111)
Creatinine, Ser: 0.89 mg/dL (ref 0.44–1.00)
Creatinine, Ser: 1.43 mg/dL — ABNORMAL HIGH (ref 0.44–1.00)
GFR, Estimated: >60 mL/min (ref >60)
GFR, Estimated: 51 mL/min — ABNORMAL LOW (ref >60)
Glucose, Bld: 100 mg/dL — ABNORMAL HIGH (ref 70–99)
Glucose, Bld: 103 mg/dL — ABNORMAL HIGH (ref 70–99)
Potassium: 4 mmol/L (ref 3.5–5.1)
Potassium: 4 mmol/L (ref 3.5–5.1)
Sodium: 129 mmol/L — ABNORMAL LOW (ref 135–145)
Sodium: 128 mmol/L — ABNORMAL LOW (ref 135–145)
Total Bilirubin: 0.4 mg/dL (ref 0.3–1.2)
Total Bilirubin: 0.6 mg/dL (ref 0.3–1.2)
Total Protein: 6.7 g/dL (ref 6.5–8.1)
Total Protein: 6.4 g/dL — ABNORMAL LOW (ref 6.5–8.1)

## 2021-06-21 LAB — CBC
HCT: 34.1 % — ABNORMAL LOW (ref 36.0–46.0)
HCT: 32.3 % — ABNORMAL LOW (ref 36.0–46.0)
Hemoglobin: 11.7 g/dL — ABNORMAL LOW (ref 12.0–15.0)
Hemoglobin: 11 g/dL — ABNORMAL LOW (ref 12.0–15.0)
MCH: 33.1 pg (ref 26.0–34.0)
MCH: 33.2 pg (ref 26.0–34.0)
MCHC: 34.3 g/dL (ref 30.0–36.0)
MCHC: 34.1 g/dL (ref 30.0–36.0)
MCV: 96.3 fL (ref 80.0–100.0)
MCV: 97.6 fL (ref 80.0–100.0)
Platelets: 307 10*3/uL (ref 150–400)
Platelets: 293 10*3/uL (ref 150–400)
RBC: 3.54 MIL/uL — ABNORMAL LOW (ref 3.87–5.11)
RBC: 3.31 MIL/uL — ABNORMAL LOW (ref 3.87–5.11)
RDW: 12.4 % (ref 11.5–15.5)
RDW: 12.6 % (ref 11.5–15.5)
WBC: 10.9 10*3/uL — ABNORMAL HIGH (ref 4.0–10.5)
WBC: 14.4 10*3/uL — ABNORMAL HIGH (ref 4.0–10.5)
nRBC: 0 % (ref 0.0–0.2)
nRBC: 0 % (ref 0.0–0.2)

## 2021-06-21 LAB — GLUCOSE, CAPILLARY
Glucose-Capillary: 101 mg/dL — ABNORMAL HIGH (ref 70–99)
Glucose-Capillary: 103 mg/dL — ABNORMAL HIGH (ref 70–99)
Glucose-Capillary: 103 mg/dL — ABNORMAL HIGH (ref 70–99)
Glucose-Capillary: 115 mg/dL — ABNORMAL HIGH (ref 70–99)
Glucose-Capillary: 83 mg/dL (ref 70–99)
Glucose-Capillary: 87 mg/dL (ref 70–99)
Glucose-Capillary: 97 mg/dL (ref 70–99)

## 2021-06-21 MED ORDER — OXYTOCIN-SODIUM CHLORIDE 30-0.9 UT/500ML-% IV SOLN
1.0000 m[IU]/min | INTRAVENOUS | Status: DC
  Administered 2021-06-22: 2 m[IU]/min via INTRAVENOUS

## 2021-06-21 MED ORDER — SCOPOLAMINE 1 MG/3DAYS TD PT72
1.0000 | MEDICATED_PATCH | TRANSDERMAL | Status: DC
  Administered 2021-06-21: 1.5 mg via TRANSDERMAL

## 2021-06-21 MED ORDER — TERBUTALINE SULFATE 1 MG/ML IJ SOLN: 0.2500 mg | Freq: Once | INTRAMUSCULAR | Status: DC | PRN

## 2021-06-21 MED ORDER — FAMOTIDINE IN NACL 20-0.9 MG/50ML-% IV SOLN
20.0000 mg | Freq: Once | INTRAVENOUS | Status: AC
  Administered 2021-06-21: 20 mg via INTRAVENOUS
  Filled 2021-06-21: qty 50

## 2021-06-21 MED ORDER — LIDOCAINE HCL (PF) 1 % IJ SOLN
INTRAMUSCULAR | Status: DC | PRN
  Administered 2021-06-21 (×2): 5 mL via EPIDURAL

## 2021-06-21 MED ORDER — SCOPOLAMINE 1 MG/3DAYS TD PT72
MEDICATED_PATCH | TRANSDERMAL | Status: AC
  Filled 2021-06-21: qty 1

## 2021-06-21 NOTE — Progress Notes (Addendum)
Misty [name] is a 30 y.o. G2P0010 at [redacted]w[redacted]d  admitted for [redacted]w[redacted]d transferred from [facility] specialty for  IOL for cHTN w/ SIPE (with severe features by BP) and DMII  Subjective: Feels contractions more in her back. IV pain medications are helping.   Objective: BP (!) 148/74   Pulse 86   Temp 97.6 F (36.4 C) (Axillary)   Resp 18   Ht 5\' 3"  (1.6 m)   Wt 96.6 kg   LMP 10/14/2020   SpO2 100%   BMI 37.71 kg/m  I/O last 3 completed shifts: In: 1061.2 [P.O.:120; I.V.:841.2; IV Piggyback:100] Out: 0  Total I/O In: 1269.8 [P.O.:110; I.V.:1059.8; IV Piggyback:100] Out: 2550 [Urine:2450; Emesis/NG output:100]  FHT:  FHR: 130 bpm, variability: moderate,  accelerations:  Present,  decelerations:  Absent UC:   Difficult to  SVE:   Dilation: 5 Effacement (%): 50 Station: -3, -2 Exam by:: Dr. [email or web address]  Attempted to assess suture lines to assess for head position (OA vs. OP). Due to patient discomfort with exam and contractions starting while assessing exam stopped. Then utilized bedside Ephriam Jenkins to assess and confirmed vertex positioning but also difficulty confirming OA vs. OP but most likely OA.  Labs: Lab Results  Component Value Date   WBC 5.5 06/20/2021   HGB 10.7 (L) 06/20/2021   HCT 30.9 (L) 06/20/2021   MCV 97.2 06/20/2021   PLT 280 06/20/2021    Assessment / Plan: Induction of labor due to preeclampsia,  now on pitocin. Unchanged since last check 2 hrs ago. Continues to be high in pelvis and head not super well engaged. Difficult to trace contractions while patient on side but subjectively having them every 2-4 mins and also palpating contractions when at bedside. Monitor adjusted again and able to trace contractions better.  Labor: Progressing on Pitocin, will continue to increase now that able to monitor contractions. Station high in pelvis and therefore will defer AROM at this time. Will reassess for AROM at next check.  Preeclampsia:  on magnesium sulfate, no signs or symptoms of  toxicity, intake and ouput balanced, and labs stable at beginning of shift Fetal Wellbeing:  Category I Pain Control:  IV pain meds I/D:   GBS unknown, PCN   #DMII BG 87, continue q4hr check  06/22/2021, MD, MPH OB Fellow, [facility]

## 2021-06-21 NOTE — Progress Notes (Signed)
LABOR PROGRESS NOTE  Misty [name] is a 30 y.o. G2P0010 at [redacted]w[redacted]d  presented for IOL for cHTN w/ superimposed preE w/ SF (BP criteria) and T2DM.   Subjective: Feels comfortable with epidural. Barely feeling contractions.  Objective: BP 128/78   Pulse 79   Temp 98.4 F (36.9 C) (Oral)   Resp 16   Ht 5\' 3"  (1.6 m)   Wt 96.6 kg   LMP 10/14/2020   SpO2 99%   BMI 37.71 kg/m  or  Vitals:   06/21/21 1026 06/21/21 1027 06/21/21 1031 06/21/21 1100  BP:  122/72 118/70 128/78  Pulse:  82 81 79  Resp:  16 16 16   Temp:      TempSrc:      SpO2: 99%  99%   Weight:      Height:       Dilation: 5.5 Effacement (%): 50 Cervical Position: Posterior Station: -2, -3 Presentation: Vertex Exam by:: Dr. FHT: baseline rate 130, moderate variability, -accels, -decels Toco: q2-57min  Labs: Lab Results  Component Value Date   WBC 10.9 (H) 06/21/2021   HGB 11.7 (L) 06/21/2021   HCT 34.1 (L) 06/21/2021   MCV 96.3 06/21/2021   PLT 307 06/21/2021    Patient Active Problem List   Diagnosis Date Noted   Chronic hypertension 06/17/2021   Anemia in pregnancy, third trimester 05/30/2021   Chronic hypertension with superimposed pre-eclampsia 05/26/2021   Marijuana use 05/24/2021   Abnormal antenatal AFP screen 02/17/2021   Type 2 diabetes mellitus complicating pregnancy in second trimester, antepartum 01/03/2021   Supervision of high risk pregnancy, antepartum 12/27/2020   Chronic hypertension affecting pregnancy 09/2013    Assessment / Plan: 30 y.o. G2P0010 at [redacted]w[redacted]d here for IOL for cHTN w/ SIPE w/ SF (BP criteria) and T2DM.   Labor: Not progressed since last checked. Continue Pitocin increases. Consider AROM at next check. Fetal Wellbeing:  Category I Pain Control:  Epidural Anticipated MOD:  Vaginal #GBS  unknown , PCN #cHTN w/ superimposed preE w/ SF: BP well controlled 110s-120s/70s. T2DM: Glucose checks 100s. Continue q4h checks.  26, DO 06/21/2021, 11:24  AM PGY-1, [facility]

## 2021-06-21 NOTE — Anesthesia Procedure Notes (Signed)
Epidural Patient location during procedure: OB Start time: 06/21/2021 9:37 AM End time: 06/21/2021 9:59 AM  Staffing Anesthesiologist: Heather Roberts, MD Performed: anesthesiologist   Preanesthetic Checklist Completed: patient identified, IV checked, site marked, risks and benefits discussed, monitors and equipment checked, pre-op evaluation and timeout performed  Epidural Patient position: sitting Prep: DuraPrep Patient monitoring: heart rate, cardiac monitor, continuous pulse ox and blood pressure Approach: midline Location: L2-L3 Injection technique: LOR saline  Needle:  Needle type: Tuohy  Needle gauge: 17 G Needle length: 9 cm Needle insertion depth: 7 cm Catheter size: 20 Guage Catheter at skin depth: 12 cm Test dose: negative and Other  Assessment Events: blood not aspirated, injection not painful, no injection resistance and negative IV test  Additional Notes Informed consent obtained prior to proceeding including risk of failure, 1% risk of PDPH, risk of minor discomfort and bruising.  Discussed rare but serious complications including epidural abscess, permanent nerve injury, epidural hematoma.  Discussed alternatives to epidural analgesia and patient desires to proceed.  Timeout performed pre-procedure verifying patient name, procedure, and platelet count.  Patient tolerated procedure well.

## 2021-06-21 NOTE — Anesthesia Preprocedure Evaluation (Signed)
Anesthesia Evaluation  Patient identified by MRN, date of birth, ID band Patient awake    Reviewed: Allergy & Precautions, NPO status , Patient's Chart, lab work & pertinent test results  Airway Mallampati: II  TM Distance: >3 FB Neck ROM: Full    Dental  (+) Dental Advisory Given   Pulmonary asthma , former smoker,    Pulmonary exam normal        Cardiovascular hypertension, Normal cardiovascular exam     Neuro/Psych negative neurological ROS  negative psych ROS   GI/Hepatic negative GI ROS, Neg liver ROS,   Endo/Other  diabetes  Renal/GU negative Renal ROS  negative genitourinary   Musculoskeletal negative musculoskeletal ROS (+)   Abdominal   Peds negative pediatric ROS (+)  Hematology negative hematology ROS (+) anemia ,   Anesthesia Other Findings   Reproductive/Obstetrics negative OB ROS                             Anesthesia Physical Anesthesia Plan  ASA: 3  Anesthesia Plan: Epidural   Post-op Pain Management:    Induction:   PONV Risk Score and Plan:   Airway Management Planned: Natural Airway  Additional Equipment:   Intra-op Plan:   Post-operative Plan:   Informed Consent: I have reviewed the patients History and Physical, chart, labs and discussed the procedure including the risks, benefits and alternatives for the proposed anesthesia with the patient or authorized representative who has indicated his/her understanding and acceptance.     Dental advisory given  Plan Discussed with: Anesthesiologist  Anesthesia Plan Comments:         Anesthesia Quick Evaluation

## 2021-06-21 NOTE — Progress Notes (Addendum)
LABOR PROGRESS NOTE  Misty [name] is a 30 y.o. G2P0010 at [redacted]w[redacted]d  presented for IOL for cHTN w/ superimposed preE w/ SF (BP criteria) and T2DM.  Subjective: Feeling nauseous and beginning to have low back pain that feels like pressure.  Objective: BP 125/80   Pulse (!) 101   Temp 98.3 F (36.8 C) (Oral)   Resp 14   Ht 5\' 3"  (1.6 m)   Wt 96.6 kg   LMP 10/14/2020   SpO2 99%   BMI 37.71 kg/m  or  Vitals:   06/21/21 1301 06/21/21 1333 06/21/21 1401 06/21/21 1432  BP: (!) 142/90 120/73 135/76 125/80  Pulse: 82 83 83 (!) 101  Resp: 14 16 16 14   Temp: 98.3 F (36.8 C)     TempSrc: Oral     SpO2:      Weight:      Height:       Dilation: 5 Effacement (%): 60 Cervical Position: Posterior Station: -2 Presentation: Vertex Exam by:: , RN FHT: baseline rate 130, moderate variability, -accels, -decels Toco: q2-12min  Labs: Lab Results  Component Value Date   WBC 10.9 (H) 06/21/2021   HGB 11.7 (L) 06/21/2021   HCT 34.1 (L) 06/21/2021   MCV 96.3 06/21/2021   PLT 307 06/21/2021    Patient Active Problem List   Diagnosis Date Noted   Chronic hypertension 06/17/2021   Anemia in pregnancy, third trimester 05/30/2021   Chronic hypertension with superimposed pre-eclampsia 05/26/2021   Marijuana use 05/24/2021   Abnormal antenatal AFP screen 02/17/2021   Type 2 diabetes mellitus complicating pregnancy in second trimester, antepartum 01/03/2021   Supervision of high risk pregnancy, antepartum 12/27/2020   Chronic hypertension affecting pregnancy 09/2013    Assessment / Plan: 30 y.o. G2P0010 at [redacted]w[redacted]d here for IOL for cHTN w/ superimposed preE w/ SF (BP criteria) and T2DM.  Labor: Progressing. Planned to perform AROM but patient declined wanting to wait 2 hours due to her nausea. Attempt AROM at next check if patient approved Fetal Wellbeing:  Category I Pain Control:  Epidural Anticipated MOD:  Vaginal #GBS  unknown > PCN #cHTN w/ superimposed preE w/ SF (BP  criteria): Bps well controlled. Continue current medication regimen. #T2DM: glucose well controlled in 90s-100s #Nausea: continue zofran q4h. Replace scopolamine patch. Pepcid 20mg  once.  26, DO 06/21/2021, 3:18 PM PGY-1, [facility]

## 2021-06-21 NOTE — Progress Notes (Signed)
Misty [name] is a 30 y.o. G2P0010 at [redacted]w[redacted]d transferred from [facility] specialty for  IOL for cHTN w/ SIPE (with severe features by BP) and DMII  Subjective: Feels contractions. IV pain medications helping a little. Does not want epidural  Objective: BP (!) 152/90   Pulse 76   Temp 97.6 F (36.4 C) (Axillary)   Resp 20   Ht 5\' 3"  (1.6 m)   Wt 96.6 kg   LMP 10/14/2020   SpO2 100%   BMI 37.71 kg/m  I/O last 3 completed shifts: In: 1061.2 [P.O.:120; I.V.:841.2; IV Piggyback:100] Out: 0  Total I/O In: 1103.4 [P.O.:110; I.V.:943.4; IV Piggyback:50] Out: 2150 [Urine:2050; Emesis/NG output:100]  FHT:  FHR: 120 bpm, variability: moderate,  accelerations:  Present,  decelerations:  Absent UC:   not tracing on monitor while patient on side, when examining appear to be regular, every 2-3 minutes SVE:   Dilation: 6 Effacement (%): 50 Station: -2 Exam by:: Dr. [email or web address]  Labs: Lab Results  Component Value Date   WBC 5.5 06/20/2021   HGB 10.7 (L) 06/20/2021   HCT 30.9 (L) 06/20/2021   MCV 97.2 06/20/2021   PLT 280 06/20/2021    Assessment / Plan:   IOL for cHTN w/ SIPE (with severe features by BP) and DMII   Labor: Progressing on Pitocin, will continue to increase then AROM. Will assess for AROM at next check and consider placing IUPC for evaluation for MVU and assistance with pitocin uptitration Preeclampsia:  on magnesium sulfate, no signs or symptoms of toxicity, intake and ouput balanced, and labs stable Fetal Wellbeing:  Category I Pain Control:  IV pain meds I/D:   GBS unknown , PCN   #DMII Last BG 87, continue q4hr checks. Once past 7 cm will plan to switch to q2hr checks  06/22/2021 06/21/2021, 3:02 AM

## 2021-06-21 NOTE — Progress Notes (Signed)
LABOR PROGRESS NOTE  Misty [name] is a 30 y.o. G2P0010 at [redacted]w[redacted]d  presented for IOL for cHTN w/ superimposed preE w/ SF (BP criteria) and T2DM.   Subjective: Still feeling nauseous but better controlled with medications. Amenable to AROM.  Objective: BP (!) 140/91   Pulse 78   Temp 98.2 F (36.8 C)   Resp 12   Ht 5\' 3"  (1.6 m)   Wt 96.6 kg   LMP 10/14/2020   SpO2 99%   BMI 37.71 kg/m  or  Vitals:   06/21/21 1531 06/21/21 1601 06/21/21 1633 06/21/21 1702  BP: 125/83 128/87 (!) 131/91 (!) 140/91  Pulse: 85 82 82 78  Resp: 14 14 16 12   Temp:    98.2 F (36.8 C)  TempSrc:      SpO2:      Weight:      Height:       Dilation: 6 Effacement (%): 80 Cervical Position: Posterior Station: -2 Presentation: Vertex Exam by:: Dr. FHT: baseline rate 135, moderate variability, -accels, -decels Toco: q3-4 min  Labs: Lab Results  Component Value Date   WBC 10.9 (H) 06/21/2021   HGB 11.7 (L) 06/21/2021   HCT 34.1 (L) 06/21/2021   MCV 96.3 06/21/2021   PLT 307 06/21/2021    Patient Active Problem List   Diagnosis Date Noted   Chronic hypertension 06/17/2021   Anemia in pregnancy, third trimester 05/30/2021   Chronic hypertension with superimposed pre-eclampsia 05/26/2021   Marijuana use 05/24/2021   Abnormal antenatal AFP screen 02/17/2021   Type 2 diabetes mellitus complicating pregnancy in second trimester, antepartum 01/03/2021   Supervision of high risk pregnancy, antepartum 12/27/2020   Chronic hypertension affecting pregnancy 09/2013    Assessment / Plan: 30 y.o. G2P0010 at [redacted]w[redacted]d here for IOL for cHTN w/ superimposed preE w/ SF (BP criteria) and T2DM.  Labor: Minimal progression. AROM'd for clear. Continue pitocin. Consider IUPC at next check. Fetal Wellbeing:  Category I Pain Control:  Epidural Anticipated MOD:  Vaginal #GBS  unknown > PCN  26, DO 06/21/2021, 6:06 PM PGY-1, [facility]

## 2021-06-21 NOTE — Progress Notes (Signed)
Misty [name] is a 30 y.o. G2P0010 at 102w1d  transferred from [facility] specialty for  IOL for cHTN w/ SIPE (with severe features by BP) and DMII    Subjective: Reports feeling some pressure with contractions despite epidural. Also reports having back pain Objective: BP 138/86   Pulse 87   Temp 98.3 F (36.8 C) (Oral)   Resp 16   Ht 5\' 3"  (1.6 m)   Wt 96.6 kg   LMP 10/14/2020   SpO2 99%   BMI 37.71 kg/m  I/O last 3 completed shifts: In: 4788 [P.O.:315; I.V.:4025.6; Other:97; IV Piggyback:350.4] Out: 3945 [Urine:3295; Emesis/NG output:650] Total I/O In: 651.4 [P.O.:120; I.V.:413.7; Other:36; IV Piggyback:81.7] Out: 115 [Urine:115] General: appears tired. Arousable with verbal stimuli. AOX3 FHT:  FHR: 135 bpm, variability: minimal ,  accelerations:  Abscent,  decelerations:  Absent UC:   irregular while pitocin off SVE:   Dilation: 6 Effacement (%): 100 Station: -1 Exam by:: Dr. [email or web address]  Labs: Lab Results  Component Value Date   WBC 10.9 (H) 06/21/2021   HGB 11.7 (L) 06/21/2021   HCT 34.1 (L) 06/21/2021   MCV 96.3 06/21/2021   PLT 307 06/21/2021    Assessment / Plan: IOL for cHTN w/ SIPE (with severe features by BP) and DMII   Labor:  On pitocin for ~ 24 hrs, pitocin at 26. Cervical dilation with minimal progression, effacement progressed throughout past 24 hrs from 50% to 100%. Station unchanged from last check. AROMed as of 4 hrs ago. Will stop pitocin for pit break now and restart in 2 hrs. Will place IUPC at that time. Preeclampsia:  on magnesium sulfate, intake and ouput balanced, and will recheck labs now including Mg level Fetal Wellbeing:  Category II for minimal variability Pain Control:  Epidural I/D:   GBS unknown, PCN  #DMII Last CBG 103, received 1U of SSI. Will continue q4hr checks  06/23/2021 06/21/2021, 10:25 PM

## 2021-06-22 ENCOUNTER — Encounter (HOSPITAL_COMMUNITY): Payer: Self-pay | Admitting: Family Medicine

## 2021-06-22 DIAGNOSIS — O24424 Gestational diabetes mellitus in childbirth, insulin controlled: Secondary | ICD-10-CM

## 2021-06-22 DIAGNOSIS — O1002 Pre-existing essential hypertension complicating childbirth: Secondary | ICD-10-CM

## 2021-06-22 DIAGNOSIS — O119 Pre-existing hypertension with pre-eclampsia, unspecified trimester: Secondary | ICD-10-CM

## 2021-06-22 DIAGNOSIS — O1414 Severe pre-eclampsia complicating childbirth: Secondary | ICD-10-CM

## 2021-06-22 DIAGNOSIS — Z3A33 33 weeks gestation of pregnancy: Secondary | ICD-10-CM

## 2021-06-22 LAB — CULTURE, BETA STREP (GROUP B ONLY)

## 2021-06-22 LAB — CBC
HCT: 31.8 % — ABNORMAL LOW (ref 36.0–46.0)
Hemoglobin: 10.9 g/dL — ABNORMAL LOW (ref 12.0–15.0)
MCH: 33.3 pg (ref 26.0–34.0)
MCHC: 34.3 g/dL (ref 30.0–36.0)
MCV: 97.2 fL (ref 80.0–100.0)
Platelets: 275 10*3/uL (ref 150–400)
RBC: 3.27 MIL/uL — ABNORMAL LOW (ref 3.87–5.11)
RDW: 12.3 % (ref 11.5–15.5)
WBC: 16 10*3/uL — ABNORMAL HIGH (ref 4.0–10.5)
nRBC: 0 % (ref 0.0–0.2)

## 2021-06-22 LAB — MAGNESIUM
Magnesium: 8.9 mg/dL (ref 1.7–2.4)
Magnesium: 6.4 mg/dL (ref 1.7–2.4)

## 2021-06-22 LAB — GLUCOSE, CAPILLARY
Glucose-Capillary: 116 mg/dL — ABNORMAL HIGH (ref 70–99)
Glucose-Capillary: 165 mg/dL — ABNORMAL HIGH (ref 70–99)
Glucose-Capillary: 212 mg/dL — ABNORMAL HIGH (ref 70–99)
Glucose-Capillary: 145 mg/dL — ABNORMAL HIGH (ref 70–99)
Glucose-Capillary: 197 mg/dL — ABNORMAL HIGH (ref 70–99)

## 2021-06-22 LAB — COMPREHENSIVE METABOLIC PANEL
ALT: 13 U/L (ref 0–44)
AST: 23 U/L (ref 15–41)
Albumin: 2.4 g/dL — ABNORMAL LOW (ref 3.5–5.0)
Alkaline Phosphatase: 181 U/L — ABNORMAL HIGH (ref 38–126)
Anion gap: 11 (ref 5–15)
BUN: 11 mg/dL (ref 6–20)
CO2: 19 mmol/L — ABNORMAL LOW (ref 22–32)
Calcium: 7.8 mg/dL — ABNORMAL LOW (ref 8.9–10.3)
Chloride: 98 mmol/L (ref 98–111)
Creatinine, Ser: 1.39 mg/dL — ABNORMAL HIGH (ref 0.44–1.00)
GFR, Estimated: 52 mL/min — ABNORMAL LOW (ref >60)
Glucose, Bld: 168 mg/dL — ABNORMAL HIGH (ref 70–99)
Potassium: 4.1 mmol/L (ref 3.5–5.1)
Sodium: 128 mmol/L — ABNORMAL LOW (ref 135–145)
Total Bilirubin: 0.6 mg/dL (ref 0.3–1.2)
Total Protein: 6.1 g/dL — ABNORMAL LOW (ref 6.5–8.1)

## 2021-06-22 MED ORDER — COCONUT OIL OIL: 1.0000 "application " | TOPICAL_OIL | Status: DC | PRN

## 2021-06-22 MED ORDER — MAGNESIUM SULFATE 40 GM/1000ML IV SOLN: 1.0000 g/h | INTRAVENOUS | Status: AC

## 2021-06-22 MED ORDER — HYDRALAZINE HCL 20 MG/ML IJ SOLN
10.0000 mg | Freq: Once | INTRAMUSCULAR | Status: DC
Start: 2021-06-22 — End: 2021-06-26

## 2021-06-22 MED ORDER — TRANEXAMIC ACID-NACL 1000-0.7 MG/100ML-% IV SOLN: 1000.0000 mg | INTRAVENOUS | Status: DC

## 2021-06-22 MED ORDER — DIPHENHYDRAMINE HCL 25 MG PO CAPS: 25.0000 mg | ORAL_CAPSULE | Freq: Four times a day (QID) | ORAL | Status: DC | PRN

## 2021-06-22 MED ORDER — DIBUCAINE (PERIANAL) 1 % EX OINT: 1.0000 "application " | TOPICAL_OINTMENT | CUTANEOUS | Status: DC | PRN

## 2021-06-22 MED ORDER — ONDANSETRON HCL 4 MG PO TABS: 4.0000 mg | ORAL_TABLET | ORAL | Status: DC | PRN

## 2021-06-22 MED ORDER — INSULIN ASPART 100 UNIT/ML IJ SOLN
0.0000 [IU] | Freq: Three times a day (TID) | INTRAMUSCULAR | Status: DC
  Administered 2021-06-22: 3 [IU] via SUBCUTANEOUS
  Administered 2021-06-22: 4 [IU] via SUBCUTANEOUS
  Administered 2021-06-22: 2 [IU] via SUBCUTANEOUS
  Administered 2021-06-23: 4 [IU] via SUBCUTANEOUS
  Administered 2021-06-23 (×2): 2 [IU] via SUBCUTANEOUS
  Administered 2021-06-24 (×2): 3 [IU] via SUBCUTANEOUS
  Administered 2021-06-24: 2 [IU] via SUBCUTANEOUS
  Administered 2021-06-25: 6 [IU] via SUBCUTANEOUS
  Administered 2021-06-25: 1 [IU] via SUBCUTANEOUS
  Administered 2021-06-25: 3 [IU] via SUBCUTANEOUS

## 2021-06-22 MED ORDER — SIMETHICONE 80 MG PO CHEW: 80.0000 mg | CHEWABLE_TABLET | ORAL | Status: DC | PRN

## 2021-06-22 MED ORDER — IBUPROFEN 600 MG PO TABS
600.0000 mg | ORAL_TABLET | Freq: Four times a day (QID) | ORAL | Status: DC
  Administered 2021-06-22 – 2021-06-26 (×16): 600 mg via ORAL
  Filled 2021-06-22 (×16): qty 1

## 2021-06-22 MED ORDER — WITCH HAZEL-GLYCERIN EX PADS: 1.0000 "application " | MEDICATED_PAD | CUTANEOUS | Status: DC | PRN

## 2021-06-22 MED ORDER — MISOPROSTOL 200 MCG PO TABS
ORAL_TABLET | ORAL | Status: AC
  Filled 2021-06-22: qty 5

## 2021-06-22 MED ORDER — BENZOCAINE-MENTHOL 20-0.5 % EX AERO: 1.0000 "application " | INHALATION_SPRAY | CUTANEOUS | Status: DC | PRN

## 2021-06-22 MED ORDER — ZOLPIDEM TARTRATE 5 MG PO TABS: 5.0000 mg | ORAL_TABLET | Freq: Every evening | ORAL | Status: DC | PRN

## 2021-06-22 MED ORDER — PRENATAL MULTIVITAMIN CH
1.0000 | ORAL_TABLET | Freq: Every day | ORAL | Status: DC
  Administered 2021-06-23 – 2021-06-25 (×3): 1 via ORAL
  Filled 2021-06-22 (×3): qty 1

## 2021-06-22 MED ORDER — MISOPROSTOL 200 MCG PO TABS
1000.0000 ug | ORAL_TABLET | Freq: Once | ORAL | Status: AC
  Administered 2021-06-22: 1000 ug via RECTAL

## 2021-06-22 MED ORDER — SENNOSIDES-DOCUSATE SODIUM 8.6-50 MG PO TABS
2.0000 | ORAL_TABLET | Freq: Every day | ORAL | Status: DC
  Administered 2021-06-23 – 2021-06-24 (×2): 2 via ORAL
  Filled 2021-06-22 (×2): qty 2

## 2021-06-22 MED ORDER — ACETAMINOPHEN 325 MG PO TABS
650.0000 mg | ORAL_TABLET | ORAL | Status: DC | PRN
  Administered 2021-06-22 – 2021-06-23 (×3): 650 mg via ORAL
  Filled 2021-06-22 (×3): qty 2

## 2021-06-22 MED ORDER — ONDANSETRON HCL 4 MG/2ML IJ SOLN: 4.0000 mg | INTRAMUSCULAR | Status: DC | PRN

## 2021-06-22 MED ORDER — TETANUS-DIPHTH-ACELL PERTUSSIS 5-2.5-18.5 LF-MCG/0.5 IM SUSY: 0.5000 mL | PREFILLED_SYRINGE | Freq: Once | INTRAMUSCULAR | Status: DC

## 2021-06-22 MED ORDER — TRANEXAMIC ACID-NACL 1000-0.7 MG/100ML-% IV SOLN
INTRAVENOUS | Status: AC
  Administered 2021-06-22: 1000 mg
  Filled 2021-06-22: qty 100

## 2021-06-22 NOTE — Discharge Summary (Signed)
Postpartum Discharge Summary     Patient Name: Misty [name] DOB: 1990/12/15 MRN: [id number]  Date of admission: 06/17/2021 Delivery date:06/22/2021  Delivering provider: Lattie Corns  Date of discharge: 06/26/2021  Admitting diagnosis: Chronic hypertension [I10] Intrauterine pregnancy: [redacted]w[redacted]d    Secondary diagnosis:  Active Problems:   Chronic hypertension affecting pregnancy   Supervision of high risk pregnancy, antepartum   Type 2 diabetes mellitus complicating pregnancy in second trimester, antepartum   Abnormal antenatal AFP screen   Anemia in pregnancy, third trimester   Chronic hypertension with superimposed preeclampsia  Additional problems: none    Discharge diagnosis: Preeclampsia (severe)                                              Post partum procedures: none Augmentation: AROM, Pitocin, Cytotec, and IP Foley Complications: None  Hospital course: Induction of Labor With Vaginal Delivery   30y.o. yo G2P0111 at 337w2das admitted to the hospital 06/17/2021 for induction of labor.  Indication for induction:  cHTN with superimposed preeclampsia .  [facility]  Patient had an uncomplicated labor course as follows: Membrane Rupture Time/Date: 5:45 PM ,06/21/2021   Delivery Method:Vaginal, Spontaneous  Episiotomy: None  Lacerations:  None  Details of delivery can be found in separate delivery note.  Due to preeclampsia patient received Magnesium during and 24hr postpartum.  Her postpartum course was complicated by elevated BPs and anti-hypertensive medications were adjusted as needed. Good control noted on PPD#3. Otherwise, patient had a routine postpartum course. Patient is discharged home 06/26/21.  Newborn Data: Birth date:06/22/2021  Birth time:6:13 AM  Gender:Female  Living status:Living  Apgars:5 ,7  Weight:2100 g   Magnesium Sulfate received: Yes: Seizure prophylaxis BMZ received:  No Rhophylac:N/A MMR:N/A T-DaP:Given prenatally Flu: Yes Transfusion:No  Physical exam  Vitals:   06/25/21 1557 06/25/21 1937 06/25/21 2348 06/26/21 0407  BP: 132/83 (!) 149/85 134/73 134/84  Pulse: 96 96 93 91  Resp: _0 Temp: 98.1 F (36.7 C) 98.9 F (37.2 C) 98.3 F (36.8 C) 98.1 F (36.7 C)  TempSrc: Oral Oral Oral Oral  SpO2: 99% 100% 98% 99%  Weight:      Height:       General: alert, cooperative, and no distress Lochia: appropriate Uterine Fundus: firm Incision: N/A DVT Evaluation: No evidence of DVT seen on physical exam. Labs: Lab Results  Component Value Date   WBC 15.9 (H) 06/23/2021   HGB 10.6 (L) 06/23/2021   HCT 30.9 (L) 06/23/2021   MCV 96.9 06/23/2021   PLT 306 06/23/2021   CMP Latest Ref Rng & Units 06/23/2021  Glucose 70 - 99 mg/dL 158(H)  BUN 6 - 20 mg/dL 8  Creatinine 0.44 - 1.00 mg/dL 1.03(H)  Sodium 135 - 145 mmol/L 132(L)  Potassium 3.5 - 5.1 mmol/L 4.1  Chloride 98 - 111 mmol/L 101  CO2 22 - 32 mmol/L 22  Calcium 8.9 - 10.3 mg/dL 8.1(L)  Total Protein 6.5 - 8.1 g/dL 6.2(L)  Total Bilirubin 0.3 - 1.2 mg/dL 0.4  Alkaline Phos 38 - 126 U/L 173(H)  AST 15 - 41 U/L 21  ALT 0 - 44 U/L 13   Edinburgh Score: Edinburgh Postnatal Depression Scale Screening Tool 06/23/2021  I have been able to laugh and see the funny side of things. 3  I have looked forward with enjoyment to things. 3  I have blamed myself unnecessarily when things went wrong. 0  I have been anxious or worried for no good reason. 0  I have felt scared or panicky for no good reason. 0  Things have been getting on top of me. 0  I have been so unhappy that I have had difficulty sleeping. 0  I have felt sad or miserable. 0  I have been so unhappy that I have been crying. 0  The thought of harming myself has occurred to me. 0  Edinburgh Postnatal Depression Scale Total 6     After visit meds:  Allergies as of 06/26/2021       Reactions   Iodides Anaphylaxis,  Itching   Morphine And Related Anaphylaxis, Itching   Shellfish Allergy Anaphylaxis, Itching   Pt reports "watery eyes"   Ultram [tramadol Hcl] Hives   Hives and swollen lips         Medication List     STOP taking these medications    Accu-Chek Guide test strip Generic drug: glucose blood   Accu-Chek Softclix Lancets lancets   aspirin EC 81 MG tablet   blood glucose meter kit and supplies   Blood Pressure Monitoring Devi   Dexcom G6 Sensor Misc   Dexcom G6 Transmitter Misc   famotidine 10 MG tablet Commonly known as: PEPCID   insulin aspart 100 UNIT/ML injection Commonly known as: novoLOG Replaced by: insulin aspart 100 UNIT/ML FlexPen   Insulin Pen Needle 31G X 5 MM Misc   labetalol 200 MG tablet Commonly known as: NORMODYNE   ondansetron 8 MG disintegrating tablet Commonly known as: Zofran ODT   Prenatal Vitamins 28-0.8 MG Tabs   promethazine 25 MG tablet Commonly known as: PHENERGAN   scopolamine 1 MG/3DAYS Commonly known as: Transderm-Scop (1.5 MG)       TAKE these medications    acetaminophen 325 MG tablet Commonly known as: Tylenol Take 2 tablets (650 mg total) by mouth every 4 (four) hours as needed (for pain scale < 4).   furosemide 20 MG tablet Commonly known as: LASIX Take 1 tablet (20 mg total) by mouth 2 (two) times daily for 4 days.   ibuprofen 600 MG tablet Commonly known as: ADVIL Take 1 tablet (600 mg total) by mouth every 6 (six) hours.   insulin aspart 100 UNIT/ML FlexPen Commonly known as: NOVOLOG Inject 5 Units into the skin 3 (three) times daily with meals. Replaces: insulin aspart 100 UNIT/ML injection   insulin glargine 100 UNIT/ML Solostar Pen Commonly known as: LANTUS Inject 12 Units into the skin daily. What changed:  how much to take when to take this additional instructions   lisinopril 20 MG tablet Commonly known as: ZESTRIL Take 1 tablet (20 mg total) by mouth daily.   NIFEdipine 60 MG 24 hr  tablet Commonly known as: ADALAT CC Take 1 tablet (60 mg total) by mouth 2 (two) times daily.   prenatal multivitamin Tabs tablet Take 1 tablet by mouth daily at 12 noon.               Durable Medical Equipment  (From admission, onward)           Start     Ordered   06/23/21 1116  For home use only DME double electric breast pump  Once       Comments: ICD-10 code Z39.1   06/23/21 1116  Discharge home in stable condition Infant Feeding: Bottle and Breast Infant Disposition:NICU Discharge instruction: per After Visit Summary and Postpartum booklet. Activity: Advance as tolerated. Pelvic rest for 6 weeks.  Diet: carb modified diet Future Appointments: Future Appointments  Date Time Provider [place]  06/29/2021 10:00 AM [facility] NURSE [facility]  07/20/2021  3:15 PM Donnamae Jude, MD [facility]   Follow up Visit:  [place] [facility] [place] [facility]. Schedule an appointment as soon as possible for a visit in 1 week(s).   Specialty: Obstetrics and Gynecology Contact information: [place] [address] [phone]               Message sent to FT by Dr. Cy Blamer on 06/22/2021  Please schedule this patient for a In person postpartum visit in 4 weeks with the following provider: MD. Additional Postpartum F/U:BP check 1 week  High risk pregnancy complicated by: GDM and HTN Delivery mode:  Vaginal, Spontaneous  Anticipated Birth Control:   Declines   06/26/2021 Verita Schneiders, MD

## 2021-06-22 NOTE — Progress Notes (Signed)
Misty [name] is a 30 y.o. G2P0010 at [redacted]w[redacted]d IOL for cHTN w/ SIPE (with severe features by BP) and DMII  Subjective: Reports back pain. Denies headaches.   Objective: BP 120/65   Pulse 73   Temp 98.2 F (36.8 C) (Oral)   Resp 15   Ht 5\' 3"  (1.6 m)   Wt 96.6 kg   LMP 10/14/2020   SpO2 99%   BMI 37.71 kg/m  I/O last 3 completed shifts: In: 4788 [P.O.:315; I.V.:4025.6; Other:97; IV Piggyback:350.4] Out: 3945 [Urine:3295; Emesis/NG output:650] Total I/O In: 1243.9 [P.O.:120; I.V.:877.9; Other:96; IV Piggyback:150] Out: 635 [Urine:435; Emesis/NG output:200]  FHT:  FHR: 130 bpm, variability: minimal ,  accelerations:  Abscent,  decelerations:  Present - intermittently   UC:   regular, every 4-7 minutes SVE:   Dilation: 8 Effacement (%): 100 Station: 0 Exam by:: Dr. [email or web address]  Labs: Lab Results  Component Value Date   WBC 14.4 (H) 06/21/2021   HGB 11.0 (L) 06/21/2021   HCT 32.3 (L) 06/21/2021   MCV 97.6 06/21/2021   PLT 293 06/21/2021    Assessment / Plan:  IOL for cHTN w/ SIPE (with severe features by BP) and DMII  Labor:  cervix 100% effaced, dilation progressed from 6 to 7.5/8cm, station ~0 to +1. Some intermittent late decelerations to 120, repositioned patient. MVU not adequate. Will continue uptitrating pitocin.   Fetal Wellbeing:  Category II, variability improved to moderate variability Pain Control:  Epidural I/D:   GBS unknown, PCN  #Preeclampsia:   Mg decreased to 1/hr given elevated level. BP at goal (110s-120s/60s).  06/23/2021 06/22/2021, 3:40 AM

## 2021-06-22 NOTE — Progress Notes (Signed)
Misty [name] is a 30 y.o. G2P0010 at [redacted]w[redacted]d here for IOL for cHTN w/ SIPE (with severe features by BP) and DMII   Subjective: Reports feeling more pelvic pressure with contractions  Objective: BP 124/78   Pulse 86   Temp 98.3 F (36.8 C) (Oral)   Resp 16   Ht 5\' 3"  (1.6 m)   Wt 96.6 kg   LMP 10/14/2020   SpO2 99%   BMI 37.71 kg/m  I/O last 3 completed shifts: In: 4788 [P.O.:315; I.V.:4025.6; Other:97; IV Piggyback:350.4] Out: 3945 [Urine:3295; Emesis/NG output:650] Total I/O In: 651.4 [P.O.:120; I.V.:413.7; Other:36; IV Piggyback:81.7] Out: 455 [Urine:255; Emesis/NG output:200]  FHT:  FHR: 130 bpm, variability: moderate,  accelerations:  Abscent,  decelerations:  Absent UC:   irregular, every 7 minutes, MVU 30 SVE:   Dilation: 6.5 Effacement (%): 100 Station: 0, Plus 1 Exam by:: Dr. [email or web address]  Labs: Lab Results  Component Value Date   WBC 14.4 (H) 06/21/2021   HGB 11.0 (L) 06/21/2021   HCT 32.3 (L) 06/21/2021   MCV 97.6 06/21/2021   PLT 293 06/21/2021    Assessment / Plan: IOL for cHTN w/ SIPE (with severe features by BP) and DMII  Labor:  Since last check, fetal head descent to 0 to +1 station, dilation unchanged. Restarting pitocin now, IUPC placed.   Fetal Wellbeing:  Category I, low end of moderate variability. Likely 2/2 Mg level.No decels at this time. Will continue monitoring.  Pain Control:  Epidural I/D:   GBS unknown, PCN   Preeclampsia:  on magnesium sulfate, BP stable and not in severe range. Mg level checked and above range at 8.9. Will decrease Mg GTT to 1/hr.     06/23/2021 06/22/2021, 12:16 AM

## 2021-06-22 NOTE — Progress Notes (Signed)
NICU charge nurse called to advise that patient is going to start pushing and to make neo aware. Advised that patient is on Mag x 3 days, diabetic, and non reactive strip. Giving heads up that we will do delivery call.

## 2021-06-22 NOTE — Lactation Note (Signed)
This note was copied from a baby's chart. Lactation Consultation Note  Patient Name: Misty [name] MLYYT'K Date: 06/22/2021   Age:30 hours  Spoke to L&D RN Victorino Dike and she reported to [facility] that this is not a good time for a lactation visit, they're working with mom and trying to get her BP stable. LC will attempt to visit with mom again once she gets transferred to her room in [facility] care.   Merlie Noga S Edell Mesenbrink 06/22/2021, 10:07 AM

## 2021-06-22 NOTE — Lactation Note (Signed)
This note was copied from a baby's chart. Lactation Consultation Note  Patient Name: Misty [name] GLOVF'I Date: 06/22/2021 Reason for consult: Initial assessment;Late-preterm 34-36.6wks;1st time breastfeeding;Primapara;NICU baby;Infant < 6lbs Age:30 hours  Visited with mom of 7 hours old LPI NICU female, she's a P1 and reported (+) breast changes during the pregnancy. LC assisted with hand expression and pumping, mom still pumping when exiting the room, praised her for her efforts.  Reviewed pumping schedule, expectations, supply/demand, benefits of breastmilk for NICU babies and lactogenesis II. Mom voiced she wanted to do both, breast and formula, she doesn't want baby to have any donor milk.  Plan of care:  Encouraged mom to start pumping consistently, every 3 hours, at least 8 pumping sessions/24 hours Hand expression and breast massage were also encouraged prior pumping  Family members (aunts) present at the time of [facility] consultation. All questions and concerns answered, family to call NICU LC PRN.  Maternal Data Has patient been taught Hand Expression?: Yes Does the patient have breastfeeding experience prior to this delivery?: No  Feeding Mother's Current Feeding Choice: Breast Milk and Formula  Lactation Tools Discussed/Used Tools: Pump;Flanges Flange Size: 24 Breast pump type: Double-Electric Breast Pump Pump Education: Setup, frequency, and cleaning;Milk Storage Reason for Pumping: LPI in NICU Pumping frequency: q 3 hours (recommended) Pumped volume:  (drops)  Interventions Interventions: Breast feeding basics reviewed;Breast massage;Hand express;DEBP;Education;"The NICU and Your Baby" book;LC Services brochure  Discharge Pump: DEBP;Personal (Medela DEBP at home)  Consult Status Consult Status: Follow-up Date: 06/22/21 Follow-up type: In-patient   Margarita Bobrowski Venetia Constable 06/22/2021, 2:04 PM

## 2021-06-22 NOTE — Progress Notes (Signed)
Results for TOMICA, ARSENEAULT (MRN [id number]) as of 06/22/2021 12:00  Ref. Range 06/22/2021 10:07  Magnesium Latest Ref Range: 1.7 - 2.4 mg/dL 6.4 (HH)   Dr. Para March aware of current magnesium value Carmelina Dane, RN

## 2021-06-22 NOTE — Anesthesia Postprocedure Evaluation (Signed)
Anesthesia Post Note  Patient: Misty [name]  Procedure(s) Performed: AN AD HOC LABOR EPIDURAL     Patient location during evaluation: Mother Baby Anesthesia Type: Epidural Level of consciousness: awake and alert Pain management: pain level controlled Vital Signs Assessment: post-procedure vital signs reviewed and stable Respiratory status: spontaneous breathing, nonlabored ventilation and respiratory function stable Cardiovascular status: stable Postop Assessment: no headache, no backache and epidural receding Anesthetic complications: no   No notable events documented.  Last Vitals:  Vitals:   06/22/21 1030 06/22/21 1125  BP: 121/66 123/67  Pulse: 83 76  Resp: 16 17  Temp: 36.8 C 36.8 C  SpO2:  100%    Last Pain:  Vitals:   06/22/21 1300  TempSrc:   PainSc: 0-No pain   Pain Goal: Patients Stated Pain Goal: 3 (06/22/21 1144)                 Corneilus Heggie

## 2021-06-22 NOTE — Progress Notes (Signed)
Results for LINDEN, MIKES (MRN [id number]) as of 06/22/2021 12:00  Ref. Range 06/22/2021 10:07  Creatinine Latest Ref Range: 0.44 - 1.00 mg/dL 0.22 (H)  Dr. Para March aware of current Creatinine level Carmelina Dane, RN

## 2021-06-23 ENCOUNTER — Ambulatory Visit: Payer: 59

## 2021-06-23 ENCOUNTER — Other Ambulatory Visit (HOSPITAL_COMMUNITY): Payer: Self-pay

## 2021-06-23 LAB — GLUCOSE, CAPILLARY
Glucose-Capillary: 129 mg/dL — ABNORMAL HIGH (ref 70–99)
Glucose-Capillary: 213 mg/dL — ABNORMAL HIGH (ref 70–99)
Glucose-Capillary: 128 mg/dL — ABNORMAL HIGH (ref 70–99)
Glucose-Capillary: 136 mg/dL — ABNORMAL HIGH (ref 70–99)

## 2021-06-23 LAB — COMPREHENSIVE METABOLIC PANEL
ALT: 13 U/L (ref 0–44)
AST: 21 U/L (ref 15–41)
Albumin: 2.5 g/dL — ABNORMAL LOW (ref 3.5–5.0)
Alkaline Phosphatase: 173 U/L — ABNORMAL HIGH (ref 38–126)
Anion gap: 9 (ref 5–15)
BUN: 8 mg/dL (ref 6–20)
CO2: 22 mmol/L (ref 22–32)
Calcium: 8.1 mg/dL — ABNORMAL LOW (ref 8.9–10.3)
Chloride: 101 mmol/L (ref 98–111)
Creatinine, Ser: 1.03 mg/dL — ABNORMAL HIGH (ref 0.44–1.00)
GFR, Estimated: >60 mL/min (ref >60)
Glucose, Bld: 158 mg/dL — ABNORMAL HIGH (ref 70–99)
Potassium: 4.1 mmol/L (ref 3.5–5.1)
Sodium: 132 mmol/L — ABNORMAL LOW (ref 135–145)
Total Bilirubin: 0.4 mg/dL (ref 0.3–1.2)
Total Protein: 6.2 g/dL — ABNORMAL LOW (ref 6.5–8.1)

## 2021-06-23 LAB — CBC
HCT: 30.9 % — ABNORMAL LOW (ref 36.0–46.0)
Hemoglobin: 10.6 g/dL — ABNORMAL LOW (ref 12.0–15.0)
MCH: 33.2 pg (ref 26.0–34.0)
MCHC: 34.3 g/dL (ref 30.0–36.0)
MCV: 96.9 fL (ref 80.0–100.0)
Platelets: 306 10*3/uL (ref 150–400)
RBC: 3.19 MIL/uL — ABNORMAL LOW (ref 3.87–5.11)
RDW: 12.6 % (ref 11.5–15.5)
WBC: 15.9 10*3/uL — ABNORMAL HIGH (ref 4.0–10.5)
nRBC: 0 % (ref 0.0–0.2)

## 2021-06-23 MED ORDER — LABETALOL HCL 5 MG/ML IV SOLN
20.0000 mg | INTRAVENOUS | Status: DC | PRN
  Administered 2021-06-23: 20 mg via INTRAVENOUS
  Filled 2021-06-23: qty 4

## 2021-06-23 MED ORDER — INSULIN ASPART 100 UNIT/ML FLEXPEN
3.0000 [IU] | PEN_INJECTOR | Freq: Three times a day (TID) | SUBCUTANEOUS | 11 refills | Status: DC
  Filled 2021-06-23: qty 3, 30d supply, fill #0

## 2021-06-23 MED ORDER — INSULIN GLARGINE 100 UNIT/ML SOLOSTAR PEN
10.0000 [IU] | PEN_INJECTOR | Freq: Every day | SUBCUTANEOUS | 11 refills | Status: DC
  Filled 2021-06-23: qty 3, 30d supply, fill #0

## 2021-06-23 MED ORDER — FUROSEMIDE 40 MG PO TABS
20.0000 mg | ORAL_TABLET | Freq: Two times a day (BID) | ORAL | Status: DC
  Administered 2021-06-23 – 2021-06-26 (×6): 20 mg via ORAL
  Filled 2021-06-23 (×6): qty 1

## 2021-06-23 MED ORDER — LISINOPRIL 10 MG PO TABS
10.0000 mg | ORAL_TABLET | Freq: Every day | ORAL | 0 refills | Status: DC
  Filled 2021-06-23: qty 30, 30d supply, fill #0

## 2021-06-23 MED ORDER — FUROSEMIDE 20 MG PO TABS
20.0000 mg | ORAL_TABLET | Freq: Two times a day (BID) | ORAL | 0 refills | Status: DC
  Filled 2021-06-23: qty 8, 4d supply, fill #0

## 2021-06-23 MED ORDER — PRENATAL MULTIVITAMIN CH
1.0000 | ORAL_TABLET | Freq: Every day | ORAL | Status: DC
Start: 2021-06-24 — End: 2021-07-19

## 2021-06-23 MED ORDER — LISINOPRIL 10 MG PO TABS
10.0000 mg | ORAL_TABLET | Freq: Every day | ORAL | Status: DC
  Administered 2021-06-23 – 2021-06-24 (×2): 10 mg via ORAL
  Filled 2021-06-23 (×2): qty 1

## 2021-06-23 MED ORDER — ACETAMINOPHEN 325 MG PO TABS
650.0000 mg | ORAL_TABLET | ORAL | 0 refills | Status: DC | PRN
  Filled 2021-06-23: qty 30, 3d supply, fill #0

## 2021-06-23 MED ORDER — HYDRALAZINE HCL 20 MG/ML IJ SOLN: 10.0000 mg | INTRAMUSCULAR | Status: DC | PRN

## 2021-06-23 MED ORDER — INSULIN GLARGINE-YFGN 100 UNIT/ML ~~LOC~~ SOLN
10.0000 [IU] | Freq: Every day | SUBCUTANEOUS | Status: DC
  Administered 2021-06-23 – 2021-06-24 (×2): 10 [IU] via SUBCUTANEOUS
  Filled 2021-06-23 (×4): qty 0.1

## 2021-06-23 MED ORDER — LABETALOL HCL 5 MG/ML IV SOLN: 80.0000 mg | INTRAVENOUS | Status: DC | PRN

## 2021-06-23 MED ORDER — INSULIN ASPART 100 UNIT/ML IJ SOLN
3.0000 [IU] | Freq: Three times a day (TID) | INTRAMUSCULAR | Status: DC
  Administered 2021-06-23 – 2021-06-24 (×4): 3 [IU] via SUBCUTANEOUS

## 2021-06-23 MED ORDER — LABETALOL HCL 5 MG/ML IV SOLN
40.0000 mg | INTRAVENOUS | Status: DC | PRN
  Filled 2021-06-23: qty 8

## 2021-06-23 MED ORDER — IBUPROFEN 600 MG PO TABS
600.0000 mg | ORAL_TABLET | Freq: Four times a day (QID) | ORAL | 0 refills | Status: DC
  Filled 2021-06-23: qty 30, 8d supply, fill #0

## 2021-06-23 NOTE — Progress Notes (Signed)
Post Partum Day 1 s/p SVD in s/o CHTN/SIPE, T2DM Subjective: up ad lib, voiding, tolerating PO, and notes pain in her abdomen at her fundus but overall doing well.   Objective: Blood pressure (!) 150/84, pulse 83, temperature 98.2 F (36.8 C), temperature source Oral, resp. rate 16, height 5\' 3"  (1.6 m), weight 96.6 kg, last menstrual period 10/14/2020, SpO2 98 %, unknown if currently breastfeeding.  Physical Exam:  General: alert, cooperative, and no distress Lochia: appropriate Uterine Fundus: firm Incision: n/a DVT Evaluation: No evidence of DVT seen on physical exam.  Recent Labs    06/22/21 1007 06/23/21 0529  HGB 10.9* 10.6*  HCT 31.8* 30.9*   CMP Latest Ref Rng & Units 06/23/2021 06/22/2021 06/21/2021  Glucose 70 - 99 mg/dL 06/23/2021) 381(W) 299(B)  BUN 6 - 20 mg/dL 8 11 10   Creatinine 0.44 - 1.00 mg/dL 716(R) ) 6.78(L)  Sodium 135 - 145 mmol/L 132(L) 128(L) 128(L)  Potassium 3.5 - 5.1 mmol/L 4.1 4.1 4.0  Chloride 98 - 111 mmol/L 101 98 95(L)  CO2 22 - 32 mmol/L 22 19(L) 20(L)  Calcium 8.9 - 10.3 mg/dL 8.1(L) 7.8(L) 8.2(L)  Total Protein 6.5 - 8.1 g/dL 6.2(L) 6.1(L) 6.4(L)  Total Bilirubin 0.3 - 1.2 mg/dL 0.4 0.6 0.6  Alkaline Phos 38 - 126 U/L 173(H) 181(H) 209(H)  AST 15 - 41 U/L 21 23 23   ALT 0 - 44 U/L 13 13 14       Assessment/Plan: CHTN with SIPE with SF - She notes BP previously managed in pregnancy on amlodipine 5. Will continue Procardia 60 BID but discussed eventually she may resume this - She has some edema in her LE, so we will add in lasix today since Cr improved to 1.03 today and see if this improves BP. Currently 150s/90s but much improved from post-delivery when they were 190s/115s.   T2DM - Reviewed DM educator note and will resume semglee and novolog with meals  Routine PP - Baby girl in the NICU - Breast and bottle feeding - She is considering Nexplanon vs IUD - Rh positive, Rubella immune - HgB is 10.6 - no indication for iron.  Continue PNV.  - s/p flu shot   LOS: 6 days   3.81(O 06/23/2021, 11:49 AM

## 2021-06-23 NOTE — Clinical Social Work Maternal (Signed)
CLINICAL SOCIAL WORK MATERNAL/CHILD NOTE  Patient Details  Name: Misty [name] MRN: [id number] Date of Birth: 07-11-1991  Date:  06/23/2021  Clinical Social Worker Initiating Note:  Abundio Miu, [place] Date/Time: Initiated:  06/23/21/1223     Child's Name:  Misty [name]   Biological Parents:  Mother   Need for Interpreter:  None   Reason for Referral:  Current Substance Use/Substance Use During Pregnancy  , Parental Support of Premature Babies < 25 weeks/or Critically Ill babies   Address:  [address] [place], [place] [address]   Phone number:  [phone] (home)     Additional phone number:   Household Members/Support Persons (HM/SP):       HM/SP Name Relationship DOB or Age  HM/SP -1        HM/SP -2        HM/SP -3        HM/SP -4        HM/SP -5        HM/SP -6        HM/SP -7        HM/SP -8          Natural Supports (not living in the home):  Extended Family   Professional Supports: None   Employment: Full-time   Type of Work: [place]   Education:  [employer] school graduate   Homebound arranged:    Museum/gallery curator Resources:  Multimedia programmer    Other Resources:  Physicist, medical  , [employer] Considerations Which May Impact Care:    Strengths:  Ability to meet basic needs  , Pediatrician chosen, Home prepared for child  , Understanding of illness   Psychotropic Medications:         Pediatrician:    [place]olicitor area  Pediatrician List:   [facility]  [place]      Pediatrician Fax Number:    Risk Factors/Current Problems:  Substance Use     Cognitive State:  Able to Concentrate  , Alert  , Goal Oriented  , Linear Thinking     Mood/Affect:  Calm  , Happy  , Interested     CSW Assessment: CSW met with [name] at bedside to complete psychosocial assessment. CSW introduced self and explained role. [name] was welcoming, pleasant, and  remained engaged during assessment. [name] reported that she is currently residing at a maternity house ([facility] [place]) and will be transitioning into her apartment. [name] reported that she receives both [facility] and food stamps. [name] reported that she has all items needed to care for infant including a car seat, basinet, and 3 pack and plays. CSW inquired about [name]'s support system, [name] reported that her cousin is a support.   CSW inquired about [name]'s mental health history. [name] denied any mental health history. CSW inquired about how [name] was feeling emotionally after giving birth, [name] reported that she was feeling alright. [name] presented calm and did not demonstrate any acute mental health signs/symptoms. CSW assessed for safety, [name] denied SI, HI, and domestic violence.   CSW provided education regarding the baby blues period vs. perinatal mood disorders, discussed treatment and gave resources for mental health follow up if concerns arise.  CSW recommends self-evaluation during the postpartum time period using the New Mom Checklist from Postpartum Progress and encouraged [name] to contact a medical professional if symptoms are noted  at any time.    CSW provided review of Sudden Infant Death Syndrome (SIDS) precautions.    CSW and [name] discussed infant's NICU admission. CSW informed [name] about the NICU, what to expect, and resources/supports available while infant is admitted to the NICU. [name] reported that she feels well informed about infant's care. [name] denied any transportation barriers with visiting infant in the NICU. [name] reported that meal vouchers would be helpful, CSW agreed to place at infant's bedside. [name] denied any questions/concerns regarding the NICU.   CSW informed [name] about hospital drug screen policy due to substance use during pregnancy. [name] denied any substance use. CSW informed [name] that [name] has a positive UDS for THC. [name] denied any marijuana use and shared that it must have been from second hand  smoke. CSW informed [name] that infant's UDS and CDS would be monitored and a CPS report would be made if warranted. [name] verbalized understanding and denied any questions.   CSW will continue to offer resources/supports while infant is admitted to the NICU.   CSW Plan/Description:  Psychosocial Support and Ongoing Assessment of Needs, Perinatal Mood and Anxiety Disorder (PMADs) Education, Sudden Infant Death Syndrome (SIDS) Education, [place], CSW Will Continue to Monitor Umbilical Cord Tissue Drug Screen Results and Make Report if [place], Other Patient/Family Education    Burnis Medin, LCSW 06/23/2021, 12:25 PM

## 2021-06-23 NOTE — Progress Notes (Addendum)
Inpatient Diabetes Program Recommendations  AACE/ADA: New Consensus Statement on Inpatient Glycemic Control (2015)  Target Ranges:  Prepandial:   less than 140 mg/dL      Peak postprandial:   less than 180 mg/dL (1-2 hours)      Critically ill patients:  140 - 180 mg/dL   Lab Results  Component Value Date   GLUCAP 197 (H) 06/22/2021   HGBA1C 7.0 (A) 03/09/2021    Review of Glycemic Control Results for AVRIANA, JOO (MRN [id number]) as of 06/23/2021 08:47  Ref. Range 06/22/2021 11:29 06/22/2021 13:36 06/22/2021 20:13 06/22/2021 21:54  Glucose-Capillary Latest Ref Range: 70 - 99 mg/dL 932 (H) 355 (H) 732 (H) 197 (H)  Diabetes history: DM 2 Outpatient Diabetes medications:  Novolog 8 units TID, Lantus 18 units bid Pre-Pregnant: Lantus 18 units, Novolog 20 units TID  Current orders for Inpatient glycemic control: none   Inpatient Diabetes Program Recommendations:    Consider adding Semglee 10 units QD, Novolog 3 units TID (Assuming patient consuming >50% of meals) and change diet to carb modified.  Thanks, Lujean Rave, MSN, RNC-OB Diabetes Coordinator [phone] (8a-5p)

## 2021-06-23 NOTE — Lactation Note (Addendum)
This note was copied from a baby's chart.  NICU Lactation Consultation Note  Patient Name: Misty [name] OMBTD'H Date: 06/23/2021 Age:30 hours   Subjective Reason for consult: NICU baby; Follow-up assessment Mother pumped infrequently during the 1st 24-hours. She plans to increase the frequency today.  Mother does not have a pump for at-home use. She previously reported she had a pump, but confirmed today that she does not. Mother would like a stork pump, if eligible.   Objective Infant data: Mother's Current Feeding Choice: Breast Milk and Donor Milk  Infant feeding assessment Scale for Readiness: 3   Maternal data: [id number]  Vaginal, Spontaneous Does the patient have breastfeeding experience prior to this delivery?: No T2DM Pumping frequency: 2 x in first 24-hours Pumped volume: 0 mL (drops) Flange Size: 24  WIC Program: Yes Pump: DEBP; Personal (Medela DEBP at home)  Assessment Infant:  Maternal: Mother is at-risk for low milk supply because of T2DM and infrequent pumping.   Intervention/Plan Interventions: Education Tools: Pump; Flanges  Plan: Consult Status: Follow-up  NICU Follow-up type: Verify absence of engorgement; Verify onset of copious milk (Need to verify eligibility of Stork Pump. Otherwise, refer to [facility] for pump.)   Elder Negus 06/23/2021, 11:21 AM

## 2021-06-24 LAB — GLUCOSE, CAPILLARY
Glucose-Capillary: 183 mg/dL — ABNORMAL HIGH (ref 70–99)
Glucose-Capillary: 137 mg/dL — ABNORMAL HIGH (ref 70–99)
Glucose-Capillary: 183 mg/dL — ABNORMAL HIGH (ref 70–99)

## 2021-06-24 MED ORDER — INSULIN ASPART 100 UNIT/ML IJ SOLN: 4.0000 [IU] | Freq: Three times a day (TID) | INTRAMUSCULAR | Status: DC

## 2021-06-24 MED ORDER — INSULIN ASPART 100 UNIT/ML IJ SOLN
5.0000 [IU] | Freq: Three times a day (TID) | INTRAMUSCULAR | Status: DC
  Administered 2021-06-25 – 2021-06-26 (×3): 5 [IU] via SUBCUTANEOUS

## 2021-06-24 NOTE — Plan of Care (Signed)
  Problem: Nutrition: Goal: Adequate nutrition will be maintained Outcome: Completed/Met   Problem: Coping: Goal: Ability to verbalize concerns and feelings about labor and delivery will improve Outcome: Completed/Met   Problem: Role Relationship: Goal: Will demonstrate positive interactions with the child Outcome: Completed/Met   Problem: Coping: Goal: Ability to adjust to condition or change in health will improve Outcome: Completed/Met   Problem: Nutritional: Goal: Maintenance of adequate nutrition will improve Outcome: Completed/Met   Problem: Education: Goal: Knowledge of disease or condition will improve Outcome: Completed/Met Goal: Knowledge of the prescribed therapeutic regimen will improve Outcome: Completed/Met   Problem: Coping: Goal: Ability to adjust to condition or change in health will improve Outcome: Completed/Met   Problem: Nutritional: Goal: Maintenance of adequate nutrition will improve Outcome: Completed/Met   Problem: Education: Goal: Knowledge of condition will improve Outcome: Completed/Met   Problem: Activity: Goal: Will verbalize the importance of balancing activity with adequate rest periods Outcome: Completed/Met   Problem: Coping: Goal: Ability to identify and utilize available resources and services will improve Outcome: Completed/Met   Problem: Role Relationship: Goal: Ability to demonstrate positive interaction with newborn will improve Outcome: Completed/Met

## 2021-06-24 NOTE — Progress Notes (Signed)
Post Partum Day 2 Subjective: no complaints, up ad lib, voiding, and tolerating PO  Objective: Vitals:   06/23/21 2039 06/24/21 0100 06/24/21 0322 06/24/21 0804  BP: (!) 143/83 127/65 134/74 (!) 141/89  Pulse: 89 82 77 92  Resp:   18 18  Temp:   98.2 F (36.8 C) 98.2 F (36.8 C)  TempSrc:   Oral Oral  SpO2:   97% 98%  Weight:      Height:        Intake/Output Summary (Last 24 hours) at 06/24/2021 5697 Last data filed at 06/24/2021 0400 Gross per 24 hour  Intake 900 ml  Output 1925 ml  Net -1025 ml    Physical Exam:  General: alert, cooperative, and appears stated age 30: appropriate Uterine Fundus: firm DVT Evaluation: No evidence of DVT seen on physical exam.  Recent Labs    06/22/21 1007 06/23/21 0529  HGB 10.9* 10.6*  HCT 31.8* 30.9*    Assessment/Plan: Plan for discharge tomorrow and Contraception undecided Poor BP control Lisinopril and lasix added to Procardia yesterday--required IV treatment late yesterday Will watch for BP control one more day Routine pp care Baby in NICU and doing well   LOS: 7 days   Reva Bores 06/24/2021, 9:00 AM

## 2021-06-24 NOTE — Lactation Note (Signed)
This note was copied from a baby's chart.  NICU Lactation Consultation Note  Patient Name: Misty [name] [id number] Date: 06/24/2021 Age:30 hours   Subjective Reason for consult: Follow-up assessment Mother continues to pump frequently and without difficulty. She is seeing "drops" today. Mother received a Medela PIS from stork pump yesterday.   Objective Infant data: Mother's Current Feeding Choice: Breast Milk and Donor Milk  Infant feeding assessment Scale for Readiness: 1     Maternal data: [id number]  Vaginal, Spontaneous  Pumping frequency: q3: drops today   WIC Program: Yes Pump: St[facility] Pump   Assessment Infant:   Maternal: Mother is pumping frequently. Drops of colostrum are wnl today. Intervention/Plan Interventions: Education  Plan: Consult Status: Follow-up  NICU Follow-up type: New admission follow up; Verify absence of engorgement; Verify onset of copious milk  Mother to to continue pumping q3 and delivering any EBM to NICU. Mother is aware of LC services for any further questions/concerns today.  Elder Negus 06/24/2021, 1:10 PM

## 2021-06-25 LAB — GLUCOSE, CAPILLARY
Glucose-Capillary: 253 mg/dL — ABNORMAL HIGH (ref 70–99)
Glucose-Capillary: 166 mg/dL — ABNORMAL HIGH (ref 70–99)
Glucose-Capillary: 95 mg/dL (ref 70–99)
Glucose-Capillary: 109 mg/dL — ABNORMAL HIGH (ref 70–99)
Glucose-Capillary: 72 mg/dL (ref 70–99)

## 2021-06-25 MED ORDER — INSULIN GLARGINE-YFGN 100 UNIT/ML ~~LOC~~ SOLN
12.0000 [IU] | Freq: Every day | SUBCUTANEOUS | Status: DC
  Filled 2021-06-25 (×3): qty 0.12

## 2021-06-25 MED ORDER — INSULIN GLARGINE 100 UNIT/ML SOLOSTAR PEN: 12.0000 [IU] | PEN_INJECTOR | Freq: Every day | SUBCUTANEOUS | 11 refills | Status: DC

## 2021-06-25 MED ORDER — INSULIN ASPART 100 UNIT/ML FLEXPEN: 5.0000 [IU] | PEN_INJECTOR | Freq: Three times a day (TID) | SUBCUTANEOUS | 11 refills | Status: DC

## 2021-06-25 MED ORDER — INSULIN GLARGINE-YFGN 100 UNIT/ML ~~LOC~~ SOLN
12.0000 [IU] | Freq: Once | SUBCUTANEOUS | Status: AC
  Administered 2021-06-25: 12 [IU] via SUBCUTANEOUS
  Filled 2021-06-25: qty 0.12

## 2021-06-25 MED ORDER — LISINOPRIL 20 MG PO TABS: 20.0000 mg | ORAL_TABLET | Freq: Every day | ORAL | 0 refills | Status: DC

## 2021-06-25 MED ORDER — LISINOPRIL 10 MG PO TABS
20.0000 mg | ORAL_TABLET | Freq: Every day | ORAL | Status: DC
  Administered 2021-06-25: 20 mg via ORAL
  Filled 2021-06-25: qty 2

## 2021-06-25 NOTE — Progress Notes (Addendum)
Pharmacy contacted about Semglee dose not on unit at 1038, 1112, and 1155, 1257. Still awaiting dose.

## 2021-06-25 NOTE — Progress Notes (Signed)
POSTPARTUM PROGRESS NOTE  PPD #3  Subjective:  Misty [name] is a 30 y.o. [id number] s/p NSVD at [redacted]w[redacted]d. Today she notes no acute complaints. She denies any problems with ambulating, voiding or po intake. Denies nausea or vomiting. She has passed flatus, +BM.  Pain is well controlled, notes only minimal pelvic discomfort.  Lochia minimal Denies fever/chills/chest pain/SOB.  no HA, no blurry vision, no RUQ pain  Objective: Blood pressure (!) 154/88, pulse 92, temperature 98.1 F (36.7 C), temperature source Oral, resp. rate 18, height 5\' 3"  (1.6 m), weight 96.6 kg, last menstrual period 10/14/2020, SpO2 100 %, unknown if currently breastfeeding. BP range: 136-163/76-97  Physical Exam:  General: alert, cooperative and no distress Chest: no respiratory distress Heart: regular rate and rhythm Abdomen: soft, nontender Uterine Fundus: firm, non-tender DVT Evaluation: No calf swelling or tenderness Extremities: no edema Skin: warm, dry  Results for orders placed or performed during the hospital encounter of 06/17/21 (from the past 24 hour(s))  Glucose, capillary     Status: Abnormal   Collection Time: 06/24/21  9:02 AM  Result Value Ref Range   Glucose-Capillary 183 (H) 70 - 99 mg/dL  Glucose, capillary     Status: Abnormal   Collection Time: 06/24/21  3:27 PM  Result Value Ref Range   Glucose-Capillary 137 (H) 70 - 99 mg/dL  Glucose, capillary     Status: Abnormal   Collection Time: 06/24/21 10:24 PM  Result Value Ref Range   Glucose-Capillary 183 (H) 70 - 99 mg/dL    Assessment/Plan: Misty [name] is a 30 y.o. 26 s/p NSVD at [redacted]w[redacted]d PPD#3 complicated by: 1) cHTN with superimposed preeclampsia -currently on Procardia XL 60mg  bid, Lisinopril increased this am to 20mg  daily -Lasix 20mg  bid -s/p Mag x 24hr  2) T2DM -elevated sugars noted, followed as outpt by PCP -Semglee increase by 2u to 12u daily -Novolog tid increased to 5u daily  Contraception: considering  Nexplanon Feeding: in NICU - breast and bottle  Dispo: Watch BPs today, if remain stable plan on discharge home later this afternoon   LOS: 8 days   [redacted]w[redacted]d, DO Faculty Attending, [facility] 06/25/2021, 6:48 AM

## 2021-06-25 NOTE — Lactation Note (Signed)
This note was copied from a baby's chart. Lactation Consultation Note  Patient Name: Misty [name] Date: 06/25/2021 Reason for consult: Follow-up assessment;Early term 37-38.6wks;NICU baby;1st time breastfeeding;Primapara;Infant < 6lbs;Maternal endocrine disorder Age:30 hours  Visited with mom of 99 67/70 weeks old (adjusted) NICU female, she's a P1 and reports that she's still pumping but not consistently. Explained to mom the importance of consistent pumping for the onset of lactogenesis II, she said "she'll keep trying".   LC reviewed engorgement prevention/treatment. Mom's breast are still soft but they have started to fill in; colostrum easily obtained when doing hand expression.  Mom holding baby when entered the room, baby wearing clothes, spoke about the importance of STS care.  Plan of care:   Encouraged mom to start pumping consistently, every 3 hours, at least 8 pumping sessions/24 hours Hand expression and breast massage were also encouraged prior pumping   No other support person at this time. All questions and concerns answered, family to call NICU LC PRN.  Maternal Data   Mom's supply is still WNL as today but she's at risk of delayed lactogenesis due to diabetes and infrequent pumping  Feeding Mother's Current Feeding Choice: Breast Milk and Donor Milk Nipple Type: Nfant Extra Slow Flow (gold)  Lactation Tools Discussed/Used Tools: Pump;Flanges Flange Size: 24 Breast pump type: Double-Electric Breast Pump Pump Education: Setup, frequency, and cleaning;Milk Storage Reason for Pumping: LPI in NICU Pumping frequency: 3 times/24 hours Pumped volume:  (drops)  Discharge Pump: DEBP;Stork Pump  Consult Status Consult Status: Follow-up Date: 06/25/21 Follow-up type: In-patient   Natia Fahmy Venetia Constable 06/25/2021, 12:02 PM

## 2021-06-26 ENCOUNTER — Other Ambulatory Visit (HOSPITAL_COMMUNITY): Payer: Self-pay

## 2021-06-26 LAB — SURGICAL PATHOLOGY

## 2021-06-26 LAB — GLUCOSE, CAPILLARY: Glucose-Capillary: 121 mg/dL — ABNORMAL HIGH (ref 70–99)

## 2021-06-26 MED ORDER — LISINOPRIL 20 MG PO TABS
20.0000 mg | ORAL_TABLET | Freq: Every day | ORAL | 0 refills | Status: DC
  Filled 2021-06-26: qty 30, 30d supply, fill #0

## 2021-06-26 NOTE — Lactation Note (Signed)
This note was copied from a baby's chart.  NICU Lactation Consultation Note  Patient Name: Misty [name] Date: 06/26/2021 Age:30 years   Subjective Reason for consult: Follow-up assessment; Maternal discharge Mother has not experienced breast changes today. She is pumping infrequently.   Objective Infant data: Mother's Current Feeding Choice: Breast Milk and Donor Milk  Infant feeding assessment Scale for Readiness: 3 Scale for Quality: 2     Maternal data: [id number]  Vaginal, Spontaneous   Pumping frequency: none in past day Pumped volume: 0 mL (drops) Flange Size: 24   Pump: DEBP; Stork Pump   Assessment Infant:  Maternal: Mother is at risk for low milk supply because of infrequent pumping.   Intervention/Plan Interventions: Education  Tools: Pump; Flanges  Plan: Consult Status: Follow-up  NICU Follow-up type: Weekly NICU follow up  Mother is encouraged to increase pumping to q3.  She is aware of LC services in NICU and will let infant's RN know if she has any questions or concerns this week.   Elder Negus 06/26/2021, 10:12 AM

## 2021-06-26 NOTE — Plan of Care (Signed)
Problem: Fluid Volume: Goal: Peripheral tissue perfusion will improve Outcome: Adequate for Discharge   Problem: Clinical Measurements: Goal: Complications related to disease process, condition or treatment will be avoided or minimized Outcome: Adequate for Discharge   Problem: Health Behavior/Discharge Planning: Goal: Ability to manage health-related needs will improve Outcome: Adequate for Discharge   Problem: Clinical Measurements: Goal: Ability to maintain clinical measurements within normal limits will improve Outcome: Adequate for Discharge Goal: Will remain free from infection Outcome: Adequate for Discharge Goal: Diagnostic test results will improve Outcome: Adequate for Discharge Goal: Cardiovascular complication will be avoided Outcome: Adequate for Discharge   Problem: Elimination: Goal: Will not experience complications related to bowel motility Outcome: Adequate for Discharge   Problem: Pain Managment: Goal: General experience of comfort will improve Outcome: Adequate for Discharge   Problem: Safety: Goal: Ability to remain free from injury will improve Outcome: Adequate for Discharge   Problem: Skin Integrity: Goal: Risk for impaired skin integrity will decrease Outcome: Adequate for Discharge   Problem: Education: Goal: Knowledge of Childbirth will improve Outcome: Adequate for Discharge Goal: Ability to make informed decisions regarding treatment and plan of care will improve Outcome: Adequate for Discharge Goal: Ability to state and carry out methods to decrease the pain will improve Outcome: Adequate for Discharge Goal: Individualized Educational Video(s) Outcome: Adequate for Discharge   Problem: Life Cycle: Goal: Ability to make normal progression through stages of labor will improve Outcome: Adequate for Discharge Goal: Ability to effectively push during vaginal delivery will improve Outcome: Adequate for Discharge   Problem: Safety: Goal:  Risk of complications during labor and delivery will decrease Outcome: Adequate for Discharge   Problem: Pain Management: Goal: Relief or control of pain from uterine contractions will improve Outcome: Adequate for Discharge   Problem: Education: Goal: Ability to describe self-care measures that may prevent or decrease complications (Diabetes Survival Skills Education) will improve Outcome: Adequate for Discharge Goal: Individualized Educational Video(s) Outcome: Adequate for Discharge   Problem: Fluid Volume: Goal: Ability to maintain a balanced intake and output will improve Outcome: Adequate for Discharge   Problem: Health Behavior/Discharge Planning: Goal: Ability to identify and utilize available resources and services will improve Outcome: Adequate for Discharge Goal: Ability to manage health-related needs will improve Outcome: Adequate for Discharge   Problem: Metabolic: Goal: Ability to maintain appropriate glucose levels will improve Outcome: Adequate for Discharge   Problem: Nutritional: Goal: Progress toward achieving an optimal weight will improve Outcome: Adequate for Discharge   Problem: Skin Integrity: Goal: Risk for impaired skin integrity will decrease Outcome: Adequate for Discharge   Problem: Tissue Perfusion: Goal: Adequacy of tissue perfusion will improve Outcome: Adequate for Discharge   Problem: Fluid Volume: Goal: Peripheral tissue perfusion will improve Outcome: Adequate for Discharge   Problem: Clinical Measurements: Goal: Complications related to disease process, condition or treatment will be avoided or minimized Outcome: Adequate for Discharge   Problem: Education: Goal: Ability to describe self-care measures that may prevent or decrease complications (Diabetes Survival Skills Education) will improve Outcome: Adequate for Discharge Goal: Individualized Educational Video(s) Outcome: Adequate for Discharge   Problem: Fluid Volume: Goal:  Ability to maintain a balanced intake and output will improve Outcome: Adequate for Discharge   Problem: Health Behavior/Discharge Planning: Goal: Ability to identify and utilize available resources and services will improve Outcome: Adequate for Discharge Goal: Ability to manage health-related needs will improve Outcome: Adequate for Discharge   Problem: Metabolic: Goal: Ability to maintain appropriate glucose levels will improve Outcome: Adequate  for Discharge   Problem: Nutritional: Goal: Progress toward achieving an optimal weight will improve Outcome: Adequate for Discharge   Problem: Skin Integrity: Goal: Risk for impaired skin integrity will decrease Outcome: Adequate for Discharge   Problem: Tissue Perfusion: Goal: Adequacy of tissue perfusion will improve Outcome: Adequate for Discharge   Problem: Education: Goal: Individualized Educational Video(s) Outcome: Adequate for Discharge Goal: Individualized Newborn Educational Video(s) Outcome: Adequate for Discharge   Problem: Activity: Goal: Ability to tolerate increased activity will improve Outcome: Adequate for Discharge   Problem: Life Cycle: Goal: Chance of risk for complications during the postpartum period will decrease Outcome: Adequate for Discharge   Problem: Skin Integrity: Goal: Demonstration of wound healing without infection will improve Outcome: Adequate for Discharge

## 2021-06-27 ENCOUNTER — Encounter: Payer: 59 | Admitting: Family Medicine

## 2021-06-27 ENCOUNTER — Other Ambulatory Visit (HOSPITAL_COMMUNITY): Payer: Self-pay

## 2021-06-28 ENCOUNTER — Ambulatory Visit: Payer: Self-pay

## 2021-06-28 ENCOUNTER — Ambulatory Visit: Payer: 59

## 2021-06-28 NOTE — Lactation Note (Signed)
This note was copied from a baby's chart.  NICU Lactation Consultation Note  Patient Name: Misty [name] Date: 06/28/2021 Age:30 days   Subjective Reason for consult: Follow-up assessment Mother has not pumped in past 24 hours. She denies breast fullness or discomfort but describes "feels like little rocks in my breasts."  Objective Infant data: Mother's Current Feeding Choice: Breast Milk and Donor Milk  Infant feeding assessment Scale for Readiness: 1 Scale for Quality: 2     Maternal data: [id number]  Vaginal, Spontaneous  Current breast feeding challenges:: infrequent pumping   Pumping frequency: 36mL at 1700 on 10/25   Assessment Maternal: Breasts do not feel full today. Mother is at risk for low milk supply because of infrequent pumping.   Intervention/Plan Interventions: Education   Plan: Consult Status: Follow-up  NICU Follow-up type: Weekly NICU follow up  Mother to begin pumping q3   Elder Negus 06/28/2021, 4:20 PM

## 2021-06-29 ENCOUNTER — Telehealth: Payer: 59

## 2021-06-29 ENCOUNTER — Ambulatory Visit: Payer: 59

## 2021-07-03 ENCOUNTER — Ambulatory Visit: Payer: Self-pay

## 2021-07-03 NOTE — Lactation Note (Signed)
This note was copied from a baby's chart. Lactation Consultation Note Infant to d/c today. Mother continues to pump but has low milk volume. She has not attempted to bf yet but would like to p d/c. Mother requests outpatient apt. Referral sent to outpatient LC.  Patient Name: Misty [name] [id number] Date: 07/03/2021   Age:30 days  Maternal Data  Pumping q3 15-108mL per pump  Feeding Nipple Type: Nfant Extra Slow Flow (gold)   Misty [name] 07/03/2021, 5:11 PM

## 2021-07-04 ENCOUNTER — Telehealth (HOSPITAL_COMMUNITY): Payer: Self-pay | Admitting: *Deleted

## 2021-07-04 NOTE — Telephone Encounter (Signed)
Attempted Hospital Discharge Follow-Up Call.  Left voice mail requesting that patient return RN's phone call.  

## 2021-07-05 ENCOUNTER — Other Ambulatory Visit: Payer: 59

## 2021-07-05 ENCOUNTER — Ambulatory Visit: Payer: 59

## 2021-07-05 ENCOUNTER — Telehealth: Payer: Self-pay

## 2021-07-05 NOTE — Telephone Encounter (Signed)
Patient did not keep BP check visit this AM. Called pt to follow up; VM left stating pt may call back to reschedule or respond to MyChart message.

## 2021-07-07 ENCOUNTER — Ambulatory Visit: Payer: 59

## 2021-07-18 ENCOUNTER — Other Ambulatory Visit (HOSPITAL_COMMUNITY): Payer: Self-pay

## 2021-07-19 ENCOUNTER — Other Ambulatory Visit: Payer: Self-pay

## 2021-07-19 ENCOUNTER — Ambulatory Visit (INDEPENDENT_AMBULATORY_CARE_PROVIDER_SITE_OTHER): Payer: 59 | Admitting: Primary Care

## 2021-07-19 ENCOUNTER — Encounter (INDEPENDENT_AMBULATORY_CARE_PROVIDER_SITE_OTHER): Payer: Self-pay | Admitting: Primary Care

## 2021-07-19 ENCOUNTER — Telehealth: Payer: Self-pay | Admitting: Family Medicine

## 2021-07-19 VITALS — BP 138/92 | HR 70 | Temp 97.5°F | Resp 70 | Ht 64.0 in | Wt 199.6 lb

## 2021-07-19 DIAGNOSIS — N912 Amenorrhea, unspecified: Secondary | ICD-10-CM

## 2021-07-19 DIAGNOSIS — O24112 Pre-existing diabetes mellitus, type 2, in pregnancy, second trimester: Secondary | ICD-10-CM

## 2021-07-19 DIAGNOSIS — Z3201 Encounter for pregnancy test, result positive: Secondary | ICD-10-CM

## 2021-07-19 DIAGNOSIS — I10 Essential (primary) hypertension: Secondary | ICD-10-CM | POA: Diagnosis not present

## 2021-07-19 LAB — POCT GLYCOSYLATED HEMOGLOBIN (HGB A1C): Hemoglobin A1C: 6.4 % — AB (ref 4.0–5.6)

## 2021-07-19 MED ORDER — NIFEDIPINE ER 60 MG PO TB24: 60.0000 mg | ORAL_TABLET | Freq: Two times a day (BID) | ORAL | 1 refills | Status: DC

## 2021-07-19 MED ORDER — LABETALOL HCL 100 MG PO TABS: 100.0000 mg | ORAL_TABLET | Freq: Two times a day (BID) | ORAL | 1 refills | Status: DC

## 2021-07-19 MED ORDER — NOVOLIN 70/30 (70-30) 100 UNIT/ML ~~LOC~~ SUSP: 10.0000 [IU] | Freq: Two times a day (BID) | SUBCUTANEOUS | 3 refills | Status: DC

## 2021-07-19 NOTE — Telephone Encounter (Signed)
Patient called stating she had just left her primary care and found out she was pregnant and wanted to begin prenatal care. Upon asking how far along she was she stated she did not know because she had just delivered on 06/22/2021 and had not starting having her menstrual again. There was no resolution made over the phone to decide how far along she was so a nurse was advised and was told the patient would receive a call back with further instructions on how to find out how far along she is.

## 2021-07-19 NOTE — Telephone Encounter (Signed)
Call placed back to pt. Pt did not answer. Left VM for pt to return call to office or send mychart message.  Judeth Cornfield, RN

## 2021-07-19 NOTE — Telephone Encounter (Signed)
Pt returned call. Spoke with pt. Pt states had appt with PCP today. Pt states had appt due to not having period. Pt had NSVD on 06/22/21. Pt states having increase appetite with nausea. Pt had taken home UPT on 11/12 that was positive. Positive UPT at PCP today. Had beta drawn that is not back yet. Pt states had unprotected IC on 11/10,11/11 and 11/14.  Pt denies vaginal bleeding or cramps/pain. Pt states stopped vaginal bleeding after delivery on 11/3.   Pt advised will watch for beta results and return call with possible new OB care. Pt agreeable to plan of care.   Laney Pastor

## 2021-07-19 NOTE — Progress Notes (Signed)
[facility]   [name] is a 30 y.o. female presents for hypertension evaluation, Denies shortness of breath, headaches, chest pain or lower extremity edema, sudden onset, vision changes, unilateral weakness, dizziness, paresthesias. Management of Type 2 diabetes- gestational diabetes was place on insulin. Today A1C-6.4.  Compliant with meds - Yes Checking CBGs? Yes  Fasting avg - 95-110   Postprandial average -  Exercising regularly? - Yes Watching carbohydrate intake? - Yes Neuropathy ? - No Hypoglycemic events - No  - Recovers with :   Pertinent ROS:  Polyuria - No Polydipsia - No Vision problems - No   Patient reports adherence with medications.  Dietary habits include: healthy diet  Exercise habits include:yes Family / Social history: yes- mom and grandma decease    Past Medical History:  Diagnosis Date   Abscess 09/02/2020   Allergy    Anemia 2015   Asthma    as child   Chronic hypertension with superimposed preeclampsia 05/24/2021   Diabetes mellitus without complication (HCC) 2015   DKA (diabetic ketoacidosis) (HCC) 09/02/2020   Eczema    Hypertension 09/2013   Past Surgical History:  Procedure Laterality Date   Abcess on back     Allergies  Allergen Reactions   Iodides Anaphylaxis and Itching   Morphine And Related Anaphylaxis and Itching   Shellfish Allergy Anaphylaxis and Itching    Pt reports "watery eyes"   Ultram [Tramadol Hcl] Hives    Hives and swollen lips    Current Outpatient Medications on File Prior to Visit  Medication Sig Dispense Refill   acetaminophen (TYLENOL) 325 MG tablet Take 2 tablets (650 mg total) by mouth every 4 (four) hours as needed (for pain scale < 4). 30 tablet 0   ibuprofen (ADVIL) 600 MG tablet Take 1 tablet (600 mg total) by mouth every 6 (six) hours. 30 tablet 0   insulin aspart (NOVOLOG) 100 UNIT/ML FlexPen Inject 5 Units into the skin 3 (three) times daily with meals. 10 mL 11   insulin  glargine (LANTUS) 100 UNIT/ML Solostar Pen Inject 12 Units into the skin daily. 12 mL 11   lisinopril (ZESTRIL) 20 MG tablet Take 1 tablet (20 mg total) by mouth daily. 30 tablet 0   furosemide (LASIX) 20 MG tablet Take 1 tablet (20 mg total) by mouth 2 (two) times daily for 4 days. 8 tablet 0   NIFEdipine (ADALAT CC) 60 MG 24 hr tablet Take 1 tablet (60 mg total) by mouth 2 (two) times daily. 60 tablet 0   [DISCONTINUED] dicyclomine (BENTYL) 20 MG tablet Take 1 tablet (20 mg total) by mouth 2 (two) times daily. 20 tablet 0   [DISCONTINUED] fluticasone (FLONASE) 50 MCG/ACT nasal spray Place 2 sprays into both nostrils daily. 1 g 1   [DISCONTINUED] ipratropium (ATROVENT) 0.06 % nasal spray Place 2 sprays into both nostrils 4 (four) times daily. 15 mL 1   [DISCONTINUED] omeprazole (PRILOSEC) 20 MG capsule Take 1 capsule (20 mg total) by mouth daily. 14 capsule 0   No current facility-administered medications on file prior to visit.   Social History   Socioeconomic History   Marital status: Significant Other    Spouse name: Not on file   Number of children: Not on file   Years of education: Not on file   Highest education level: Not on file  Occupational History   Not on file  Tobacco Use   Smoking status: Former    Packs/day: 0.50  Years: 2.00    Pack years: 1.00    Types: Cigarettes    Quit date: 09/08/2013    Years since quitting: 7.8   Smokeless tobacco: Never  Vaping Use   Vaping Use: Never used  Substance and Sexual Activity   Alcohol use: No    Alcohol/week: 0.0 standard drinks   Drug use: No   Sexual activity: Yes    Birth control/protection: None    Comment: 1st intercourse 42 yo--1 partner ( has been same partner 3 yrs)   Other Topics Concern   Not on file  Social History Narrative   Works at [employer] of Corporate investment banker Strain: Not on file  Food Insecurity: Food Insecurity Present   Worried About Programme researcher, broadcasting/film/video in the Last  Year: Sometimes true   Barista in the Last Year: Sometimes true  Transportation Needs: No Transportation Needs   Lack of Transportation (Medical): No   Lack of Transportation (Non-Medical): No  Physical Activity: Not on file  Stress: Not on file  Social Connections: Not on file  Intimate Partner Violence: Not on file   Family History  Problem Relation Age of Onset   Hypertension Mother    Heart disease Mother 26       CHF   Diabetes Mother    Sickle cell anemia Brother    Hypertension Maternal Grandfather    Diabetes Maternal Grandfather    Heart disease Maternal Grandfather    Healthy Father    Diabetes Maternal Grandmother    Heart disease Maternal Grandmother      OBJECTIVE:  Vitals:   07/19/21 0913 07/19/21 0914  BP: (!) 165/99 (!) 138/92  Pulse: 75 70  Resp:  (!) 70  Temp: (!) 97.5 F (36.4 C) (!) 97.5 F (36.4 C)  TempSrc: Temporal Temporal  SpO2: 100% 100%  Weight: 199 lb 9.6 oz (90.5 kg) 199 lb 9.6 oz (90.5 kg)  Height: 5\' 3"  (1.6 m) 5\' 4"  (1.626 m)    Physical exam: General: Vital signs reviewed.  Patient is well-developed and well-nourished, obese female in no acute distress and cooperative with exam. Head: Normocephalic and atraumatic. Eyes: EOMI, conjunctivae normal, no scleral icterus. Neck: Supple, trachea midline, normal ROM, no JVD, masses, thyromegaly, or carotid bruit present. Cardiovascular: RRR, S1 normal, S2 normal, no murmurs, gallops, or rubs. Pulmonary/Chest: Clear to auscultation bilaterally, no wheezes, rales, or rhonchi. Abdominal: Soft, non-tender, non-distended, BS +, no masses, organomegaly, or guarding present. Musculoskeletal: No joint deformities, erythema, or stiffness, ROM full and nontender. Extremities: No lower extremity edema bilaterally,  pulses symmetric and intact bilaterally. No cyanosis or clubbing. Neurological: A&O x3, Strength is normal Skin: Warm, dry and intact. No rashes or erythema. Psychiatric: Normal  mood and affect. speech and behavior is normal. Cognition and memory are normal.    ROS Comprehensive ROS noted in HPI  Last 3 Office BP readings: BP Readings from Last 3 Encounters:  07/19/21 (!) 138/92  06/26/21 134/84  06/14/21 129/87    BMET    Component Value Date/Time   NA 132 (L) 06/23/2021 0529   NA 136 06/12/2021 1544   K 4.1 06/23/2021 0529   CL 101 06/23/2021 0529   CO2 22 06/23/2021 0529   GLUCOSE 158 (H) 06/23/2021 0529   BUN 8 06/23/2021 0529   BUN 11 06/12/2021 1544   CREATININE 1.03 (H) 06/23/2021 0529   CREATININE 0.90 03/19/2014 1531   CALCIUM 8.1 (L) 06/23/2021 03/21/2014  GFRNONAA >60 06/23/2021 0529   GFRAA >60 02/25/2020 1241    Renal function: CrCl cannot be calculated (Patient's most recent lab result is older than the maximum 21 days allowed.).  Clinical ASCVD: No  The ASCVD Risk score (Arnett DK, et al., 2019) failed to calculate for the following reasons:   The 2019 ASCVD risk score is only valid for ages 67 to 61  ASCVD risk factors include- [place]   ASSESSMENT & PLAN: Justyn was seen today for blood pressure check and diabetes.  Diagnoses and all orders for this visit:  Type 2 diabetes mellitus with long term insulin    insulin NPH-regular Human (NOVOLIN 70/30) (70-30) 100 UNIT/ML injection; Inject 10 Units into the skin 2 (two) times daily with a meal. -     HgB A1c 6.4 -  -     Beta hCG quant (ref lab)  Amenorrhea, unspecified -     POCT urine pregnancy Positive   Chronic hypertension Counseled on blood pressure goal of less than 130/80, low-sodium, DASH diet, medication compliance, 150 minutes of moderate intensity exercise per week. Discussed medication compliance, adverse effects.  -Counseled on lifestyle modifications for blood pressure control including reduced dietary sodium, increased exercise, weight reduction and adequate sleep. Also, educated patient about the risk for cardiovascular events, stroke and heart attack. Also  counseled patient about the importance of medication adherence. If you participate in smoking, it is important to stop using tobacco as this will increase the risks associated with uncontrolled blood pressure.     NIFEdipine (ADALAT CC) 60 MG 24 hr tablet; Take 1 tablet (60 mg total) by mouth 2 (two) times daily.  Positive pregnancy test -     Beta hCG quant (ref lab)  Other orders -     labetalol (NORMODYNE) 100 MG tablet; Take 1 tablet (100 mg total) by mouth 2 (two) times daily.  This note has been created with Education officer, environmental. Any transcriptional errors are unintentional.   Grayce Sessions, NP 07/19/2021, 9:37 AM

## 2021-07-20 ENCOUNTER — Ambulatory Visit: Payer: 59 | Admitting: Family Medicine

## 2021-07-20 ENCOUNTER — Encounter (INDEPENDENT_AMBULATORY_CARE_PROVIDER_SITE_OTHER): Payer: Self-pay | Admitting: Primary Care

## 2021-07-20 LAB — POCT URINE PREGNANCY: Preg Test, Ur: POSITIVE — AB

## 2021-07-20 LAB — BETA HCG QUANT (REF LAB): hCG Quant: 41 m[IU]/mL

## 2021-07-20 NOTE — Telephone Encounter (Signed)
Spoke with pt. Pt states seeing beta results from PCP yesterday. Reviewed results with Dr Jolayne Panther. Pt advised to come back in for Stat beta on 11/18 to compare betas and to see if from recent pregnancy or new pregnancy.  Pt given recommendations and agreeable to plan of care.  Pt scheduled for nurse visit on 07/21/21 at 8:45am. Pt verbalized understanding to date and time of appt.   Laney Pastor

## 2021-07-21 ENCOUNTER — Telehealth: Payer: Self-pay | Admitting: Family Medicine

## 2021-07-21 ENCOUNTER — Other Ambulatory Visit: Payer: Self-pay | Admitting: Obstetrics and Gynecology

## 2021-07-21 ENCOUNTER — Other Ambulatory Visit: Payer: Self-pay

## 2021-07-21 ENCOUNTER — Ambulatory Visit (INDEPENDENT_AMBULATORY_CARE_PROVIDER_SITE_OTHER): Payer: 59

## 2021-07-21 VITALS — Ht 63.5 in | Wt 201.1 lb

## 2021-07-21 DIAGNOSIS — O3680X0 Pregnancy with inconclusive fetal viability, not applicable or unspecified: Secondary | ICD-10-CM

## 2021-07-21 DIAGNOSIS — R102 Pelvic and perineal pain: Secondary | ICD-10-CM

## 2021-07-21 LAB — BETA HCG QUANT (REF LAB): hCG Quant: 38 m[IU]/mL

## 2021-07-21 MED ORDER — IBUPROFEN 800 MG PO TABS: 800.0000 mg | ORAL_TABLET | Freq: Four times a day (QID) | ORAL | 2 refills | Status: DC

## 2021-07-21 MED ORDER — ACETAMINOPHEN 500 MG PO TABS: 500.0000 mg | ORAL_TABLET | Freq: Four times a day (QID) | ORAL | 2 refills | Status: DC | PRN

## 2021-07-21 MED ORDER — IBUPROFEN 600 MG PO TABS: 600.0000 mg | ORAL_TABLET | Freq: Four times a day (QID) | ORAL | 0 refills | Status: DC

## 2021-07-21 MED ORDER — ACETAMINOPHEN 325 MG PO TABS: 650.0000 mg | ORAL_TABLET | ORAL | 0 refills | Status: DC | PRN

## 2021-07-21 NOTE — Progress Notes (Signed)
Stat beta still not resulted.  Will forward to first attending Dr Alvester Morin to call pt with results and follow up.  Dr Alvester Morin aware.   Misty [name]

## 2021-07-21 NOTE — Progress Notes (Signed)
Pt here today for STAT Beta per Dr Jolayne Panther. Pt had Beta on 11/16 at PCP that was 41. Pt had NSVD on 10/20. Took home pregnancy test that was positive on 11/12. Pt here today to see if new pregnancy or from recent pregnancy.   Pt denies any vaginal bleeding, pain or cramps. Pt advised after results come back from Beta, will be contacted via phone with plan of care per Dr Donavan Foil.  Pt verbalized understanding.    Pt has PP visit scheduled on 08/07/21. Pt aware.   Pt requested refills on Tylenol and Ibuprofen. Rx sent to pharmacy on file.   Laney Pastor

## 2021-07-21 NOTE — Telephone Encounter (Signed)
Calling patient to reviewed BHCG results.  Patient is s/p SVD of 34w infant on 06/22/21, has resumed sexual activity  Patient had positive UPT with PCP.   Lab Results  Component Value Date   HCGQUANT 38 07/21/2021   HCGQUANT 41 07/19/2021   This is not appropriate rise. ddx includes normal pregnancy, failed pregnancy vs ectopic. No abdominal pain today.   Recommend follow up in office next week with repeat bHCG Patient cannot come 11/21 or 11/22 due to appointments with her child and we discussed an appt on 11/23 for exam and repeat bHCG Patient also needs ibuprofen and tylenol for pain but is unable to pay for OTC medication. I have sent in prescriptions to allow her medicaid to pay for these meds. .  Reviewed ectopic precautions today.

## 2021-07-24 NOTE — Telephone Encounter (Signed)
Call placed to pt. Pt can come to have repeat Beta on 11/22 at 1030am. Pt agreeable to plan of care.  Laney Pastor

## 2021-07-25 ENCOUNTER — Ambulatory Visit (INDEPENDENT_AMBULATORY_CARE_PROVIDER_SITE_OTHER): Payer: 59

## 2021-07-25 ENCOUNTER — Other Ambulatory Visit: Payer: 59

## 2021-07-25 ENCOUNTER — Other Ambulatory Visit: Payer: Self-pay

## 2021-07-25 VITALS — BP 120/76 | HR 79 | Wt 202.8 lb

## 2021-07-25 DIAGNOSIS — R102 Pelvic and perineal pain: Secondary | ICD-10-CM

## 2021-07-25 DIAGNOSIS — O3680X0 Pregnancy with inconclusive fetal viability, not applicable or unspecified: Secondary | ICD-10-CM

## 2021-07-25 LAB — BETA HCG QUANT (REF LAB): hCG Quant: 32 m[IU]/mL

## 2021-07-25 MED ORDER — ACETAMINOPHEN 500 MG PO TABS: 500.0000 mg | ORAL_TABLET | Freq: Four times a day (QID) | ORAL | 2 refills | Status: DC | PRN

## 2021-07-25 NOTE — Progress Notes (Signed)
Beta HCG Follow-up Visit  Misty [name] presents to [facility] for follow-up beta HCG lab. Patient is here to follow up from visit with PCP where she had positive pregnancy test. Beta HCG drawn at PCP and 48 hours later with our office. Beta HCG results reviewed by provider and found to be an inappropriate decrease. Patient denies pain and bleeding today. Discussed with patient that we are following beta HCG levels today. Results will be back in approximately 2 hours. Valid contact number for patient confirmed. I will call the patient with results.   Beta HCG results: 07/19/21 @ 0934 41  07/21/21 @ 0915 38   07/25/21 @ 1107 32   Results and patient history reviewed with Dr. Annia Friendly, per provider we will check another beta HCG level in a week. Patient called and informed of plan for follow-up.   Today patient also requested her Tylenol prescription be sent to a different pharmacy. Tylenol reordered to new pharmacy.   [name] 07/25/2021 10:35 AM

## 2021-07-26 ENCOUNTER — Ambulatory Visit: Payer: 59 | Admitting: Advanced Practice Midwife

## 2021-07-26 NOTE — Progress Notes (Signed)
Patient was evaluated by nursing staff. Agree with assessment and plan.  °

## 2021-07-31 ENCOUNTER — Other Ambulatory Visit: Payer: Self-pay | Admitting: *Deleted

## 2021-07-31 DIAGNOSIS — O3680X0 Pregnancy with inconclusive fetal viability, not applicable or unspecified: Secondary | ICD-10-CM

## 2021-08-01 ENCOUNTER — Other Ambulatory Visit: Payer: 59

## 2021-08-07 ENCOUNTER — Other Ambulatory Visit: Payer: Self-pay

## 2021-08-07 ENCOUNTER — Encounter: Payer: Self-pay | Admitting: Family Medicine

## 2021-08-07 ENCOUNTER — Ambulatory Visit (INDEPENDENT_AMBULATORY_CARE_PROVIDER_SITE_OTHER): Payer: 59 | Admitting: Obstetrics and Gynecology

## 2021-08-07 ENCOUNTER — Encounter: Payer: Self-pay | Admitting: Obstetrics and Gynecology

## 2021-08-07 VITALS — BP 156/98 | HR 78 | Ht 63.0 in | Wt 207.7 lb

## 2021-08-07 DIAGNOSIS — I10 Essential (primary) hypertension: Secondary | ICD-10-CM

## 2021-08-07 DIAGNOSIS — O24112 Pre-existing diabetes mellitus, type 2, in pregnancy, second trimester: Secondary | ICD-10-CM

## 2021-08-07 NOTE — Progress Notes (Signed)
    Post Partum Visit Note  Misty [name] is a 30 y.o. [phone] female who presents for a postpartum visit. She is 6 weeks postpartum following a normal spontaneous vaginal delivery.  I have fully reviewed the prenatal and intrapartum course. The delivery was at 34/2 gestational weeks.  Anesthesia: epidural. Postpartum course has been unremarkable. Baby is doing well. Baby is feeding by bottle - [employer] Good Start. Bleeding no bleeding. Bowel function is normal. Bladder function is normal. Patient is sexually active. Contraception method is condoms. Postpartum depression screening: negative.   The pregnancy intention screening data noted above was reviewed. Potential methods of contraception were discussed. The patient elected to proceed with No data recorded.    Health Maintenance Due  Topic Date Due   Pneumococcal Vaccine 42-12 Years old (1 - PCV) Never done   OPHTHALMOLOGY EXAM  Never done   URINE MICROALBUMIN  09/28/2021    Medical Records  Review of Systems Pertinent items noted in HPI and remainder of comprehensive ROS otherwise negative.  Objective:  LMP  (LMP Unknown)    General:  alert   Breasts:  not indicated  Lungs: clear to auscultation bilaterally  Heart:  regular rate and rhythm, S1, S2 normal, no murmur, click, rub or gallop  Abdomen: soft, non-tender; bowel sounds normal; no masses,  no organomegaly   Wound NA  GU exam:  not indicated       Assessment:    There are no diagnoses linked to this encounter.  NL postpartum exam.  CHTN DM  Plan:   Essential components of care per ACOG recommendations:  1.  Mood and well being: Patient with negative depression screening today. Reviewed local resources for support.  - Patient tobacco use? No.   - hx of drug use? No.    2. Infant care and feeding:  -Patient currently breastmilk feeding? Yes -Social determinants of health (SDOH) reviewed in EPIC. No concerns  3. Sexuality, contraception and birth  spacing - Patient does not want a pregnancy in the next year.  Desired family size is uncertain.  - Reviewed forms of contraception in tiered fashion. Patient desired condoms today.   - Discussed birth spacing of 18 months  4. Sleep and fatigue -Encouraged family/partner/community support of 4 hrs of uninterrupted sleep to help with mood and fatigue  5. Physical Recovery  - Discussed patients delivery and complications. She describes her labor as good. - Patient had a Vaginal, no problems at delivery. Patient had a 1st degree laceration. Perineal healing reviewed. Patient expressed understanding - Patient has urinary incontinence? No. - Patient is safe to resume physical and sexual activity  6.  Health Maintenance - HM due items addressed Yes - Last pap smear  Diagnosis  Date Value Ref Range Status  02/15/2021   Final   - Negative for intraepithelial lesion or malignancy (NILM)   Pap smear not done at today's visit.  -Breast Cancer screening indicated? No.   7. Chronic Disease/Pregnancy Condition follow up: Hypertension and Gestational Diabetes  Has PCP managing. To continue with current management   Nettie Elm, MD [facility], [facility]

## 2021-08-07 NOTE — Patient Instructions (Signed)

## 2021-08-24 ENCOUNTER — Ambulatory Visit: Payer: 59 | Attending: Primary Care | Admitting: Pharmacist

## 2021-08-24 ENCOUNTER — Other Ambulatory Visit: Payer: Self-pay

## 2021-08-24 ENCOUNTER — Encounter: Payer: Self-pay | Admitting: Pharmacist

## 2021-08-24 DIAGNOSIS — R102 Pelvic and perineal pain: Secondary | ICD-10-CM | POA: Diagnosis not present

## 2021-08-24 DIAGNOSIS — O24112 Pre-existing diabetes mellitus, type 2, in pregnancy, second trimester: Secondary | ICD-10-CM | POA: Diagnosis not present

## 2021-08-24 MED ORDER — ACETAMINOPHEN 500 MG PO TABS: 500.0000 mg | ORAL_TABLET | Freq: Four times a day (QID) | ORAL | 2 refills | Status: DC | PRN

## 2021-08-24 MED ORDER — LABETALOL HCL 100 MG PO TABS
100.0000 mg | ORAL_TABLET | Freq: Two times a day (BID) | ORAL | 1 refills | Status: DC
  Filled 2021-08-24: qty 60, 30d supply, fill #0

## 2021-08-24 MED ORDER — NIFEDIPINE ER 60 MG PO TB24
60.0000 mg | ORAL_TABLET | Freq: Two times a day (BID) | ORAL | 1 refills | Status: DC
  Filled 2021-08-24 – 2021-12-04 (×3): qty 180, 90d supply, fill #0

## 2021-08-24 NOTE — Progress Notes (Signed)
° ° °  S:     No chief complaint on file.  Patient arrives in good spirits.  Presents for diabetes evaluation, education, and management. Patient was referred and last seen by Primary Care Provider on 07/19/2021. She had a positive pregnancy test. Her Lantus was changed to 70/30 10 units BID. She was also started on labetalol for HTN.     Patient reports that she is doing well since her PCP visit. A1c at that visit showed good control of DM. She tells me today that the new insulin is working well for her.    Family/Social History:  - Fhx: HTN, heart disease, diabetes  - Tobacco : former smoker  - Alcohol: none   Insurance coverage/medication affordability: [employer]  Medication adherence reported.   Current diabetes medications include: NPH 70/30 10 units BID  Current hypertension medications include: labetalol 100 mg BID, nifedipine 60 mg daily  Current hyperlipidemia medications include: no statin as she is pregnant   Patient denies hypoglycemic events.  Patient reported dietary habits:  - Reports that she is limiting her carbs and sweets   Patient-reported exercise habits:  - None reported    Patient denies nocturia (nighttime urination).  Patient denies neuropathy (nerve pain). Patient denies visual changes. Patient reports self foot exams.     O:  Physical Exam  ROS  Lab Results  Component Value Date   HGBA1C 6.4 (A) 07/19/2021   There were no vitals filed for this visit.  Lipid Panel     Component Value Date/Time   CHOL 147 11/30/2020 1121   TRIG 46 11/30/2020 1121   HDL 74 11/30/2020 1121   CHOLHDL 2.0 11/30/2020 1121   CHOLHDL 2.4 11/06/2013 0923   VLDL 9 11/06/2013 0923   LDLCALC 63 11/30/2020 1121   Home fasting blood sugars: no meter with her but tells me her sugars are in a good range. Names the 80s-90s.   Home BP: able to recall one reading of 106/88.   Clinical Atherosclerotic Cardiovascular Disease (ASCVD): No  The ASCVD Risk score  (Arnett DK, et al., 2019) failed to calculate for the following reasons:   The 2019 ASCVD risk score is only valid for ages 41 to 32   A/P: Diabetes longstanding currently controlled. Patient is able to verbalize appropriate hypoglycemia management plan. Medication adherence appears appropriate. -Continued 70/30 10 units BID.  -Extensively discussed pathophysiology of diabetes, recommended lifestyle interventions, dietary effects on blood sugar control -Counseled on s/sx of and management of hypoglycemia -Next A1C anticipated 10/2021.   Hypertension longstanding currently under better control given her ambulatory pressures.  Blood pressure goal = 130/80 mmHg. Medication adherence reported. Recommend increasing labetalol if needed in the future.  -Continue current regimen for now.   Written patient instructions provided.  Total time in face to face counseling 30 minutes.   Follow up PCP Clinic Visit next week.    Butch Penny, PharmD, [place]y [place], CPP Clinical Pharmacist [facility] [place] [phone]

## 2021-08-25 ENCOUNTER — Other Ambulatory Visit: Payer: Self-pay

## 2021-08-29 ENCOUNTER — Other Ambulatory Visit: Payer: Self-pay

## 2021-08-29 ENCOUNTER — Ambulatory Visit (INDEPENDENT_AMBULATORY_CARE_PROVIDER_SITE_OTHER): Payer: 59 | Admitting: Primary Care

## 2021-08-29 ENCOUNTER — Encounter (INDEPENDENT_AMBULATORY_CARE_PROVIDER_SITE_OTHER): Payer: Self-pay | Admitting: Primary Care

## 2021-08-29 VITALS — BP 139/85 | HR 70 | Temp 97.5°F | Ht 64.0 in | Wt 208.8 lb

## 2021-08-29 DIAGNOSIS — I10 Essential (primary) hypertension: Secondary | ICD-10-CM | POA: Diagnosis not present

## 2021-08-29 MED ORDER — LABETALOL HCL 100 MG PO TABS: 100.0000 mg | ORAL_TABLET | Freq: Two times a day (BID) | ORAL | 1 refills | Status: DC

## 2021-08-29 NOTE — Progress Notes (Signed)
[facility]   Misty [name] is a 30 y.o. female presents for hypertension evaluation, Denies shortness of breath, headaches, chest pain or lower extremity edema, sudden onset, vision changes, unilateral weakness, dizziness, paresthesias. Complains of back pain and no menstrual cycle since epidural. Deferred to OBGYN  Patient reports adherence with medications.  Dietary habits include: monitoring sodium intake  Exercise habits include: walking  Family / Social history: no   Past Medical History:  Diagnosis Date   Abscess 09/02/2020   Allergy    Anemia 2015   Asthma    as child   Chronic hypertension with superimposed preeclampsia 05/24/2021   Diabetes mellitus without complication (HCC) 2015   DKA (diabetic ketoacidosis) (HCC) 09/02/2020   Eczema    Hypertension 09/2013   Past Surgical History:  Procedure Laterality Date   Abcess on back     Allergies  Allergen Reactions   Iodides Anaphylaxis and Itching   Morphine And Related Anaphylaxis and Itching   Shellfish Allergy Anaphylaxis and Itching    Pt reports "watery eyes"   Ultram [Tramadol Hcl] Hives    Hives and swollen lips    Current Outpatient Medications on File Prior to Visit  Medication Sig Dispense Refill   acetaminophen (TYLENOL) 500 MG tablet Take 1 tablet (500 mg total) by mouth every 6 (six) hours as needed. 30 tablet 2   insulin NPH-regular Human (NOVOLIN 70/30) (70-30) 100 UNIT/ML injection Inject 10 Units into the skin 2 (two) times daily with a meal. 10 mL 3   NIFEdipine (ADALAT CC) 60 MG 24 hr tablet Take 1 tablet (60 mg total) by mouth 2 (two) times daily. 180 tablet 1   [DISCONTINUED] dicyclomine (BENTYL) 20 MG tablet Take 1 tablet (20 mg total) by mouth 2 (two) times daily. 20 tablet 0   [DISCONTINUED] fluticasone (FLONASE) 50 MCG/ACT nasal spray Place 2 sprays into both nostrils daily. 1 g 1   [DISCONTINUED] ipratropium (ATROVENT) 0.06 % nasal spray Place 2 sprays into both  nostrils 4 (four) times daily. 15 mL 1   [DISCONTINUED] omeprazole (PRILOSEC) 20 MG capsule Take 1 capsule (20 mg total) by mouth daily. 14 capsule 0   No current facility-administered medications on file prior to visit.   Social History   Socioeconomic History   Marital status: Significant Other    Spouse name: Not on file   Number of children: Not on file   Years of education: Not on file   Highest education level: Not on file  Occupational History   Not on file  Tobacco Use   Smoking status: Former    Packs/day: 0.50    Years: 2.00    Pack years: 1.00    Types: Cigarettes    Quit date: 09/08/2013    Years since quitting: 7.9   Smokeless tobacco: Never  Vaping Use   Vaping Use: Never used  Substance and Sexual Activity   Alcohol use: No    Alcohol/week: 0.0 standard drinks   Drug use: No   Sexual activity: Yes    Birth control/protection: None    Comment: 1st intercourse 65 yo--1 partner ( has been same partner 3 yrs)   Other Topics Concern   Not on file  Social History Narrative   Works at [employer] of Corporate investment banker Strain: Not on file  Food Insecurity: Food Insecurity Present   Worried About Programme researcher, broadcasting/film/video in the Last Year: Sometimes true   [employer] of  Food in the Last Year: Sometimes true  Transportation Needs: No Transportation Needs   Lack of Transportation (Medical): No   Lack of Transportation (Non-Medical): No  Physical Activity: Not on file  Stress: Not on file  Social Connections: Not on file  Intimate Partner Violence: Not on file   Family History  Problem Relation Age of Onset   Hypertension Mother    Heart disease Mother 31       CHF   Diabetes Mother    Sickle cell anemia Brother    Hypertension Maternal Grandfather    Diabetes Maternal Grandfather    Heart disease Maternal Grandfather    Healthy Father    Diabetes Maternal Grandmother    Heart disease Maternal Grandmother       OBJECTIVE:  Vitals:   08/29/21 1353  BP: 139/85  Pulse: 70  Temp: (!) 97.5 F (36.4 C)  TempSrc: Temporal  SpO2: 100%  Weight: 208 lb 12.8 oz (94.7 kg)  Height: 5\' 4"  (1.626 m)   Physical exam: General: Vital signs reviewed.  Patient is well-developed and well-nourished, obese female in no acute distress and cooperative with exam. Head: Normocephalic and atraumatic. Eyes: EOMI, conjunctivae normal, no scleral icterus. Neck: Supple, trachea midline, normal ROM, no JVD, masses, thyromegaly, or carotid bruit present. Cardiovascular: RRR, S1 normal, S2 normal, no murmurs, gallops, or rubs. Pulmonary/Chest: Clear to auscultation bilaterally, no wheezes, rales, or rhonchi. Abdominal: Soft, non-tender, non-distended, BS +, no masses, organomegaly, or guarding present. Musculoskeletal: No joint deformities, erythema, or stiffness, ROM full and nontender. Extremities: No lower extremity edema bilaterally,  pulses symmetric and intact bilaterally. No cyanosis or clubbing. Neurological: A&O x3, Strength is normal Skin: Warm, dry and intact. No rashes or erythema. Psychiatric: Normal mood and affect. speech and behavior is normal. Cognition and memory are normal.     ROS Comprehensive ROS pertinent positive and negative noted in HPI Last 3 Office BP readings: BP Readings from Last 3 Encounters:  08/29/21 139/85  08/07/21 (!) 156/98  07/25/21 120/76    BMET    Component Value Date/Time   NA 132 (L) 06/23/2021 0529   NA 136 06/12/2021 1544   K 4.1 06/23/2021 0529   CL 101 06/23/2021 0529   CO2 22 06/23/2021 0529   GLUCOSE 158 (H) 06/23/2021 0529   BUN 8 06/23/2021 0529   BUN 11 06/12/2021 1544   CREATININE 1.03 (H) 06/23/2021 0529   CREATININE 0.90 03/19/2014 1531   CALCIUM 8.1 (L) 06/23/2021 0529   GFRNONAA >60 06/23/2021 0529   GFRAA >60 02/25/2020 1241    Renal function: CrCl cannot be calculated (Patient's most recent lab result is older than the maximum 21 days  allowed.).  Clinical ASCVD: No  The ASCVD Risk score (Arnett DK, et al., 2019) failed to calculate for the following reasons:   The 2019 ASCVD risk score is only valid for ages 86 to 57  ASCVD risk factors include- 76   ASSESSMENT & PLAN: [name] was seen today for blood pressure check.  Diagnoses and all orders for this visit:  Chronic hypertension -Counseled on lifestyle modifications for blood pressure control including reduced dietary sodium, increased exercise, weight reduction and adequate sleep. Also, educated patient about the risk for cardiovascular events, stroke and heart attack. Also counseled patient about the importance of medication adherence. If you participate in smoking, it is important to stop using tobacco as this will increase the risks associated with uncontrolled blood pressure.   -Hypertension longstanding diagnosed currently labetalol 100mg   bid, NIFEdipine (ADALAT CC) 60 MG 24 hr tablet  on current medications. Patient is adherent with current medications.   Goal BP:  For patients younger than 60: Goal BP < 130/80. For patients 60 and older: Goal BP < 140/90. For patients with diabetes: Goal BP < 130/80. Your most recent BP: 139/85  Minimize salt intake. Minimize alcohol intake  -     labetalol (NORMODYNE) 100 MG tablet; Take 1 tablet (100 mg total) by mouth 2 (two) times daily.   This note has been created with Education officer, environmental. Any transcriptional errors are unintentional.   Grayce Sessions, NP 08/29/2021, 2:00 PM

## 2021-09-01 ENCOUNTER — Other Ambulatory Visit: Payer: Self-pay

## 2021-09-13 ENCOUNTER — Other Ambulatory Visit (INDEPENDENT_AMBULATORY_CARE_PROVIDER_SITE_OTHER): Payer: Self-pay | Admitting: Primary Care

## 2021-09-13 DIAGNOSIS — I10 Essential (primary) hypertension: Secondary | ICD-10-CM

## 2021-09-19 ENCOUNTER — Other Ambulatory Visit (INDEPENDENT_AMBULATORY_CARE_PROVIDER_SITE_OTHER): Payer: Self-pay | Admitting: Primary Care

## 2021-09-19 DIAGNOSIS — G44021 Chronic cluster headache, intractable: Secondary | ICD-10-CM

## 2021-09-19 MED ORDER — NAPROXEN 500 MG PO TABS: 500.0000 mg | ORAL_TABLET | Freq: Two times a day (BID) | ORAL | 0 refills | Status: DC

## 2021-09-22 ENCOUNTER — Encounter: Payer: Self-pay | Admitting: Neurology

## 2021-10-30 ENCOUNTER — Encounter: Payer: 59 | Admitting: Obstetrics and Gynecology

## 2021-11-07 ENCOUNTER — Other Ambulatory Visit (INDEPENDENT_AMBULATORY_CARE_PROVIDER_SITE_OTHER): Payer: Self-pay | Admitting: Primary Care

## 2021-11-07 DIAGNOSIS — G44021 Chronic cluster headache, intractable: Secondary | ICD-10-CM

## 2021-11-08 ENCOUNTER — Other Ambulatory Visit (HOSPITAL_COMMUNITY): Payer: Self-pay

## 2021-11-08 ENCOUNTER — Other Ambulatory Visit: Payer: Self-pay

## 2021-11-08 MED ORDER — NAPROXEN 500 MG PO TABS
500.0000 mg | ORAL_TABLET | Freq: Two times a day (BID) | ORAL | 0 refills | Status: DC
Start: 2021-11-08 — End: 2021-12-04
  Filled 2021-11-08: qty 60, 30d supply, fill #0

## 2021-11-08 NOTE — Telephone Encounter (Signed)
Sent to PCP to refill if appropriate.  

## 2021-11-16 ENCOUNTER — Other Ambulatory Visit (INDEPENDENT_AMBULATORY_CARE_PROVIDER_SITE_OTHER): Payer: Self-pay | Admitting: Primary Care

## 2021-11-16 NOTE — Telephone Encounter (Signed)
Sent to PCP ?

## 2021-12-04 ENCOUNTER — Other Ambulatory Visit (INDEPENDENT_AMBULATORY_CARE_PROVIDER_SITE_OTHER): Payer: Self-pay | Admitting: Primary Care

## 2021-12-04 DIAGNOSIS — G44021 Chronic cluster headache, intractable: Secondary | ICD-10-CM

## 2021-12-04 NOTE — Progress Notes (Signed)
? ?NEUROLOGY CONSULTATION NOTE ? ?De Nurse ?MRN: [id number] ?DOB: 1991-07-19 ? ?Referring provider: Juluis Mire, NP ?Primary care provider: Juluis Mire, NP ? ?Reason for consult:  headaches ? ?Assessment/Plan:  ? ?Chronic migraine without aura, complicated by medication overuse ?Low back pain bilateral/midline without radiculopathy, appears to be myofascial  ? ?Migraine prevention:  start nortriptyline 10mg  at bedtime.  We can increase to 25mg  at bedtime in 4 weeks if needed.  As she is a mother to a baby, I would favor not using topiramate due to potential cognitive side effects. ?Migraine rescue:  sumatriptan 10mg  ?Advised to stop Percocet ?Limit use of pain relievers to no more than 2 days out of week to prevent risk of rebound or medication-overuse headache. ?Keep headache diary ?For back pain, tizanidine 2mg  up to three times daily as needed.  Caution for drowsiness. ?Follow up 5 months. ? ? ? ?Subjective:  ?Misty [name] is a 31 year old female with HTN, diabetes mellitus and asthma who presents for headaches.  History supplemented by referring provider's note. ? ?Onset:  History of daily headaches since she was young.   ?Location:  middle/top of head ?Quality:  throbbing ?Intensity:  10/10 ?Aura:  absent ?Prodrome:  absent ?Associated symptoms:  None.  She denies associated nausea, vomiting, photophobia, phonophobia, osmophobia, visual disturbance, speech disturbance, dizziness, autonomic symptoms or unilateral numbness or weakness. ?Duration:  45 to 60 minutes.  Treating with Percocet ?Frequency:  Almost daily  ?Frequency of abortive medication: 4 days a week ?Triggers:  none ?Relieving factors:  shower, rest, drink cold water ?Activity:  aggravates ? ?She delivered her baby via SVD in October 2022.  She received an epidural.   She felt back pain right after giving birth.  It is a sharp pain radiating up the middle of her low back.  No numbness, weakness or bowel/bladder dysfunction.  No  radiation down the legs.  Occurs spontaneously.  Standing or with movement, with pushing a cart, or sitting.  Lasts 35-40 minutes.  Daily.   ? ?Current NSAIDS/analgesics:  percocet ?Current triptans:  none ?Current ergotamine:  none ?Current anti-emetic:  none ?Current muscle relaxants:  none ?Current Antihypertensive medications:  labetalol ?Current Antidepressant medications:  none ?Current Anticonvulsant medications:  none ?Current anti-CGRP:  none ?Current Vitamins/Herbal/Supplements:  none ?Current Antihistamines/Decongestants:  none ?Other therapy:  shower, rest, drink cold water ?Hormone/birth control:  none ? ? ?Past NSAIDS/analgesics:  acetaminophen, naproxen, ASA, celecoxib, tramadol ?Past abortive triptans:  none ?Past abortive ergotamine:  none ?Past muscle relaxants:  cyclobenzaprine ?Past anti-emetic:  ondansetron, promethazine ?Past antihypertensive medications:  lisinopril, amlodipine, nifedipine  ?Past antidepressant medications:  none ?Past anticonvulsant medications:  none ?Past anti-CGRP:  none ?Past vitamins/Herbal/Supplements:  none ?Past antihistamines/decongestants:  Flonase, cetirizine ?Other past therapies:  none ? ?Caffeine:  Mt Dew 2 times a week.  No coffee ?Alcohol:  no ?Smoker:  no ?Diet:  Water, Gatorade, juice, yogurt.   ?Depression:  no; Anxiety:  no ?Sleep hygiene:  OK ?Family history of headache:  mom's side ? ?  ? ? ?PAST MEDICAL HISTORY: ?Past Medical History:  ?Diagnosis Date  ? Abscess 09/02/2020  ? Allergy   ? Anemia 2015  ? Asthma   ? as child  ? Chronic hypertension with superimposed preeclampsia 05/24/2021  ? Diabetes mellitus without complication ([place]) 123456  ? DKA (diabetic ketoacidosis) ([place]) 09/02/2020  ? Eczema   ? Hypertension 09/2013  ? ? ?PAST SURGICAL HISTORY: ?Past Surgical History:  ?Procedure Laterality Date  ? Abcess on  back    ? ? ?MEDICATIONS: ?Current Outpatient Medications on File Prior to Visit  ?Medication Sig Dispense Refill  ? acetaminophen (TYLENOL) 500  MG tablet Take 1 tablet (500 mg total) by mouth every 6 (six) hours as needed. 30 tablet 2  ? HUMULIN 70/30 (70-30) 100 UNIT/ML injection INJECT 10 UNITS SUBCUTANEOUSLY 2 TIMES DAILY WITH A meal 10 mL 3  ? labetalol (NORMODYNE) 100 MG tablet Take 1 tablet (100 mg total) by mouth 2 (two) times daily. 180 tablet 1  ? naproxen (NAPROSYN) 500 MG tablet Take 1 tablet (500 mg total) by mouth 2 (two) times daily with a meal. 60 tablet 0  ? NIFEdipine (ADALAT CC) 60 MG 24 hr tablet Take 1 tablet (60 mg total) by mouth 2 (two) times daily. 180 tablet 1  ? [DISCONTINUED] dicyclomine (BENTYL) 20 MG tablet Take 1 tablet (20 mg total) by mouth 2 (two) times daily. 20 tablet 0  ? [DISCONTINUED] fluticasone (FLONASE) 50 MCG/ACT nasal spray Place 2 sprays into both nostrils daily. 1 g 1  ? [DISCONTINUED] ipratropium (ATROVENT) 0.06 % nasal spray Place 2 sprays into both nostrils 4 (four) times daily. 15 mL 1  ? [DISCONTINUED] omeprazole (PRILOSEC) 20 MG capsule Take 1 capsule (20 mg total) by mouth daily. 14 capsule 0  ? ?No current facility-administered medications on file prior to visit.  ? ? ?ALLERGIES: ?Allergies  ?Allergen Reactions  ? Iodides Anaphylaxis and Itching  ? Morphine And Related Anaphylaxis and Itching  ? Shellfish Allergy Anaphylaxis and Itching  ?  Pt reports "watery eyes"  ? Ultram [Tramadol Hcl] Hives  ?  Hives and swollen lips   ? ? ?FAMILY HISTORY: ?Family History  ?Problem Relation Age of Onset  ? Hypertension Mother   ? Heart disease Mother 91  ?     CHF  ? Diabetes Mother   ? Sickle cell anemia Brother   ? Hypertension Maternal Grandfather   ? Diabetes Maternal Grandfather   ? Heart disease Maternal Grandfather   ? Healthy Father   ? Diabetes Maternal Grandmother   ? Heart disease Maternal Grandmother   ? ? ?Objective:  ?Blood pressure 132/84, pulse 91, height 5\' 3"  (1.6 m), weight 224 lb (101.6 kg), SpO2 100 %, not currently breastfeeding. ?General: No acute distress.  Patient appears well-groomed.    ?Head:  Normocephalic/atraumatic ?Eyes:  fundi examined but not visualized ?Neck: supple, no paraspinal tenderness, full range of motion ?Back: bilateral paraspinal tenderness ?Heart: regular rate and rhythm ?Lungs: Clear to auscultation bilaterally. ?Vascular: No carotid bruits. ?Neurological Exam: ?Mental status: alert and oriented to person, place, and time, recent and remote memory intact, fund of knowledge intact, attention and concentration intact, speech fluent and not dysarthric, language intact. ?Cranial nerves: ?CN I: not tested ?CN II: pupils equal, round and reactive to light, visual fields intact ?CN III, IV, VI:  full range of motion, no nystagmus, no ptosis ?CN V: facial sensation intact. ?CN VII: upper and lower face symmetric ?CN VIII: hearing intact ?CN IX, X: gag intact, uvula midline ?CN XI: sternocleidomastoid and trapezius muscles intact ?CN XII: tongue midline ?Bulk & Tone: normal, no fasciculations. ?Motor:  muscle strength 5/5 throughout ?Sensation:  Pinprick, temperature and vibratory sensation intact. ?Deep Tendon Reflexes:  2+ throughout,  toes downgoing.   ?Finger to nose testing:  Without dysmetria.   ?Heel to shin:  Without dysmetria.   ?Gait:  Normal station and stride.  Romberg negative. ? ? ? ?Thank you for allowing me to  take part in the care of this patient. ? ?Metta Clines, DO ? ?CC: Juluis Mire, NP ? ? ? ? ?

## 2021-12-05 ENCOUNTER — Ambulatory Visit (INDEPENDENT_AMBULATORY_CARE_PROVIDER_SITE_OTHER): Payer: Medicaid Other | Admitting: Neurology

## 2021-12-05 ENCOUNTER — Other Ambulatory Visit (HOSPITAL_COMMUNITY): Payer: Self-pay

## 2021-12-05 ENCOUNTER — Encounter: Payer: Self-pay | Admitting: Neurology

## 2021-12-05 VITALS — BP 132/84 | HR 91 | Ht 63.0 in | Wt 224.0 lb

## 2021-12-05 DIAGNOSIS — G8929 Other chronic pain: Secondary | ICD-10-CM

## 2021-12-05 DIAGNOSIS — G4440 Drug-induced headache, not elsewhere classified, not intractable: Secondary | ICD-10-CM | POA: Diagnosis not present

## 2021-12-05 DIAGNOSIS — G43009 Migraine without aura, not intractable, without status migrainosus: Secondary | ICD-10-CM

## 2021-12-05 DIAGNOSIS — M545 Low back pain, unspecified: Secondary | ICD-10-CM | POA: Diagnosis not present

## 2021-12-05 MED ORDER — NAPROXEN 500 MG PO TABS
500.0000 mg | ORAL_TABLET | Freq: Two times a day (BID) | ORAL | 0 refills | Status: DC
  Filled 2021-12-05: qty 60, 30d supply, fill #0

## 2021-12-05 MED ORDER — NORTRIPTYLINE HCL 10 MG PO CAPS: 10.0000 mg | ORAL_CAPSULE | Freq: Every day | ORAL | 5 refills | Status: DC

## 2021-12-05 MED ORDER — SUMATRIPTAN SUCCINATE 100 MG PO TABS: ORAL_TABLET | ORAL | 5 refills | Status: DC

## 2021-12-05 MED ORDER — TIZANIDINE HCL 2 MG PO TABS: 2.0000 mg | ORAL_TABLET | Freq: Three times a day (TID) | ORAL | 5 refills | Status: DC | PRN

## 2021-12-05 NOTE — Patient Instructions (Signed)
Start nortriptyline 10mg  at bedtime.  If no improvement in 4 weeks, contact me and we can increase dose ?Take sumatriptan at earliest onset of headache.  May repeat in 2 hours.  Maximum 2 tablets in 24 hours. ?Stop Percocet or any opioid ?Limit use of pain relievers to no more than 2 days out of week to prevent risk of rebound or medication-overuse headache. ?Keep headache diary ?Take tizanidine 2mg  every 8 hours as needed for back pain.  Caution for drowsiness ?Follow up 5 months. ?

## 2021-12-05 NOTE — Telephone Encounter (Signed)
Routed to PCP 

## 2021-12-13 ENCOUNTER — Other Ambulatory Visit (INDEPENDENT_AMBULATORY_CARE_PROVIDER_SITE_OTHER): Payer: Self-pay | Admitting: Primary Care

## 2021-12-13 DIAGNOSIS — M545 Low back pain, unspecified: Secondary | ICD-10-CM | POA: Diagnosis not present

## 2021-12-14 NOTE — Telephone Encounter (Signed)
Sent to PCP ?

## 2021-12-16 MED ORDER — LANTUS SOLOSTAR 100 UNIT/ML ~~LOC~~ SOPN
16.0000 [IU] | PEN_INJECTOR | Freq: Two times a day (BID) | SUBCUTANEOUS | 3 refills | Status: DC
  Filled 2021-12-16: qty 15, 47d supply, fill #0

## 2021-12-18 ENCOUNTER — Telehealth (INDEPENDENT_AMBULATORY_CARE_PROVIDER_SITE_OTHER): Payer: Self-pay | Admitting: Primary Care

## 2021-12-18 ENCOUNTER — Other Ambulatory Visit (HOSPITAL_COMMUNITY): Payer: Self-pay

## 2021-12-18 NOTE — Telephone Encounter (Signed)
Could you call and schedule this patient an appointment  ?

## 2021-12-18 NOTE — Telephone Encounter (Signed)
Pt called in to request a antibiotic. Pt says that she has a abcess on her bottom.  ? ? ? ?Pharmacy:  ?[facility] - [place], [place] - [address] Phone:  [phone]  ?Fax:  [phone]  ?  ? ? ?Please advise/ assist pt further  ?

## 2021-12-19 ENCOUNTER — Ambulatory Visit (INDEPENDENT_AMBULATORY_CARE_PROVIDER_SITE_OTHER): Payer: Medicaid Other | Admitting: Primary Care

## 2021-12-19 ENCOUNTER — Encounter (INDEPENDENT_AMBULATORY_CARE_PROVIDER_SITE_OTHER): Payer: Self-pay | Admitting: Primary Care

## 2021-12-19 VITALS — BP 123/81 | HR 94 | Temp 98.4°F | Ht 63.0 in | Wt 228.6 lb

## 2021-12-19 DIAGNOSIS — K612 Anorectal abscess: Secondary | ICD-10-CM | POA: Diagnosis not present

## 2021-12-19 MED ORDER — CEPHALEXIN 500 MG PO CAPS: 500.0000 mg | ORAL_CAPSULE | Freq: Three times a day (TID) | ORAL | 0 refills | Status: DC

## 2021-12-19 MED ORDER — FLUCONAZOLE 150 MG PO TABS: 150.0000 mg | ORAL_TABLET | Freq: Every day | ORAL | 1 refills | Status: DC

## 2021-12-19 NOTE — Progress Notes (Signed)
? ?  Acute Office Visit ? ?Subjective:  ? ?  ?Patient ID: Misty [name], female    DOB: 01-May-1991, 31 y.o.   MRN: [id number] ? ?Chief Complaint  ?Patient presents with  ?? abcess between buttocks  ?  Present for 2-3 days  ? ? ?HPI ?Misty [name] presents with a abscess in her buttocks that actually burst on its own with pus and blood.  ? ?Review of Systems  ?Skin:   ?     Open area  buttocks ? ? ?   ?Objective:  ?  ?BP 123/81 (BP Location: Left Arm, Patient Position: Sitting, Cuff Size: Large)   Pulse 94   Temp 98.4 ?F (36.9 ?C) (Oral)   Ht 5\' 3"  (1.6 m)   Wt 228 lb 9.6 oz (103.7 kg)   LMP 12/14/2021 (Approximate)   SpO2 96%   Breastfeeding No   BMI 40.49 kg/m?  ? ? ?Physical Exam ?Vitals reviewed.  ?Constitutional:   ?   Appearance: She is obese.  ?HENT:  ?   Head: Normocephalic.  ?   Right Ear: Tympanic membrane normal.  ?   Left Ear: Tympanic membrane normal.  ?   Nose: Nose normal.  ?Cardiovascular:  ?   Rate and Rhythm: Normal rate and regular rhythm.  ?Pulmonary:  ?   Effort: Pulmonary effort is normal.  ?   Breath sounds: Normal breath sounds.  ?Abdominal:  ?   General: Bowel sounds are normal. There is distension.  ?   Palpations: Abdomen is soft.  ?Musculoskeletal:     ?   General: Normal range of motion.  ?   Cervical back: Normal range of motion.  ?Skin: ?   General: Skin is warm and dry.  ?Neurological:  ?   Mental Status: She is alert and oriented to person, place, and time.  ?Psychiatric:     ?   Mood and Affect: Mood normal.     ?   Behavior: Behavior normal.     ?   Thought Content: Thought content normal.     ?   Judgment: Judgment normal.  ? ?No results found for any visits on 12/19/21. ? ? ?   ?Assessment & Plan:  ?Misty [name] was seen today for abcess between buttocks. ? ?Diagnoses and all orders for this visit: ? ?Abscess of anal and rectal regions ? ? ?-     cephALEXin (KEFLEX) 500 MG capsule; Take 1 capsule (500 mg total) by mouth 3 (three) times daily. ?-     fluconazole (DIFLUCAN)  150 MG tablet; Take 1 tablet (150 mg total) by mouth daily. ? ?  ?Meds ordered this encounter  ?Medications  ?? cephALEXin (KEFLEX) 500 MG capsule  ?  Sig: Take 1 capsule (500 mg total) by mouth 3 (three) times daily.  ?  Dispense:  21 capsule  ?  Refill:  0  ?? fluconazole (DIFLUCAN) 150 MG tablet  ?  Sig: Take 1 tablet (150 mg total) by mouth daily.  ?  Dispense:  1 tablet  ?  Refill:  1  ? ? ?Return if symptoms worsen or fail to improve. ? ?Rockne Coons, NP ? ? ?

## 2021-12-24 ENCOUNTER — Encounter (HOSPITAL_COMMUNITY): Payer: Self-pay | Admitting: Emergency Medicine

## 2021-12-24 ENCOUNTER — Emergency Department (HOSPITAL_COMMUNITY)
Admission: EM | Admit: 2021-12-24 | Discharge: 2021-12-24 | Disposition: A | Discharge status: Home / Self Care | Payer: Medicaid Other | Attending: Emergency Medicine | Admitting: Emergency Medicine

## 2021-12-24 ENCOUNTER — Other Ambulatory Visit: Payer: Self-pay

## 2021-12-24 DIAGNOSIS — R202 Paresthesia of skin: Secondary | ICD-10-CM | POA: Diagnosis not present

## 2021-12-24 DIAGNOSIS — I10 Essential (primary) hypertension: Secondary | ICD-10-CM | POA: Diagnosis not present

## 2021-12-24 DIAGNOSIS — T7840XA Allergy, unspecified, initial encounter: Secondary | ICD-10-CM | POA: Diagnosis not present

## 2021-12-24 DIAGNOSIS — R739 Hyperglycemia, unspecified: Secondary | ICD-10-CM | POA: Diagnosis not present

## 2021-12-24 LAB — CBG MONITORING, ED: Glucose-Capillary: 217 mg/dL — ABNORMAL HIGH (ref 70–99)

## 2021-12-24 MED ORDER — FAMOTIDINE 20 MG PO TABS: 20.0000 mg | ORAL_TABLET | Freq: Two times a day (BID) | ORAL | 0 refills | Status: DC

## 2021-12-24 MED ORDER — DIPHENHYDRAMINE HCL 25 MG PO CAPS
25.0000 mg | ORAL_CAPSULE | Freq: Once | ORAL | Status: AC
  Administered 2021-12-24: 25 mg via ORAL
  Filled 2021-12-24: qty 1

## 2021-12-24 MED ORDER — CETIRIZINE HCL 10 MG PO TABS: 10.0000 mg | ORAL_TABLET | Freq: Every day | ORAL | 0 refills | Status: DC

## 2021-12-24 MED ORDER — FAMOTIDINE 20 MG PO TABS
20.0000 mg | ORAL_TABLET | Freq: Once | ORAL | Status: AC
  Administered 2021-12-24: 20 mg via ORAL
  Filled 2021-12-24: qty 1

## 2021-12-24 NOTE — ED Provider Notes (Signed)
?[facility]-EMERGENCY DEPT ?[facility] Emergency Department ?Provider Note ?MRN:  [id number]  ?Arrival date & time: 12/24/21    ? ?Chief Complaint   ?Hypertension and Allergic Reaction ?  ?History of Present Illness   ?Misty [name] is a 31 y.o. year-old female presents to the ED with chief complaint of tingling sensation.  She states that she went to bed around midnight and woke up this morning feeling like she was having an allergic reaction to something.  She states that it felt like her blood pressure was high.  She reports that she takes labetalol.  States that she had tingling in her extremities and felt like her mouth and throat were swelling.  She denies any new contacts or ingestions.  She has not taken anything for symptoms.. ? ?History provided by patient. ? ? ?Review of Systems  ?Pertinent review of systems noted in HPI.  ? ? ?Physical Exam  ? ?Vitals:  ? 12/24/21 0500 12/24/21 0600  ?BP: (!) 130/96 127/73  ?Pulse: 73 79  ?Resp:  17  ?Temp:    ?SpO2: 100% 99%  ?  ?CONSTITUTIONAL:  well-appearing, NAD ?NEURO:  Alert and oriented x 3, CN 3-12 grossly intact, normal finger-nose, no pronator drift, sensation and strength equal in upper and lower extremities ?EYES:  eyes equal and reactive ?ENT/NECK:  Supple, no stridor, no swelling of the oropharynx, normal voice ?CARDIO: Normal rate, regular rhythm, appears well-perfused  ?PULM:  No respiratory distress, clear to auscultation bilaterally ?GI/GU:  non-distended,  ?MSK/SPINE:  No gross deformities, no edema, moves all extremities  ?SKIN:  no rash, atraumatic patient,  ? ? ?*Additional and/or pertinent findings included in MDM below ? ?Diagnostic and Interventional Summary  ? ? EKG Interpretation ? ?Date/Time:    ?Ventricular Rate:    ?PR Interval:    ?QRS Duration:   ?QT Interval:    ?QTC Calculation:   ?R Axis:     ?Text Interpretation:   ?  ? ?  ? ?Labs Reviewed  ?CBG MONITORING, ED - Abnormal; Notable for the following components:  ?    Result Value  ?  Glucose-Capillary 217 (*)   ? All other components within normal limits  ?  ?No orders to display  ?  ?Medications  ?diphenhydrAMINE (BENADRYL) capsule 25 mg (25 mg Oral Given 12/24/21 0527)  ?famotidine (PEPCID) tablet 20 mg (20 mg Oral Given 12/24/21 0528)  ?  ? ?Procedures  /  Critical Care ?Procedures ? ?ED Course and Medical Decision Making  ?I have reviewed the triage vital signs, the nursing notes, and pertinent available records from the EMR. ? ?Complexity of Problems Addressed: ?Moderate Complexity: Acute complicated illness or injury ?Comorbidities affecting this illness/injury include: ?None ?Social Determinants Affecting Care: No clinically significant social determinants affecting this chief complaint.. ? ? ?ED Course: ?After considering the following differential, allergic reaction, stress, anxiety, panic attack, I ordered benadryl and pepcid PO. ?. ? ?Clinical Course as of 12/24/21 0622  ?Wynelle Link Dec 24, 2021  ?0621 Reports feeling improved after benadryl and pepcid.  VSS.  Will DC home with PCP follow-up.  Trial pepcid and zyrtec at home. [RB]  ?  ?Clinical Course User Index ?[RB] Roxy Horseman, PA-C  ? ? ?Consultants: ?No consultations were needed in caring for this patient. ? ?Treatment and Plan: ?Patient is a well-appearing 31 year old female woke up with tingling in her extremities and throat tightness sensation.  She does not have any hives nor throat swelling on exam.  Presents has normal elevation.  I am more suspicious for panic attack versus allergic reaction.  However after, I will give her some Benadryl Pepcid in case she is having an allergic reaction to unknown substance.  Do not see any evidence of or symptoms of anaphylaxis.  Anticipate discharge. ? ?Emergency department workup does not suggest an emergent condition requiring admission or immediate intervention beyond  what has been performed at this time. The patient is safe for discharge and has  been instructed to return immediately  for worsening symptoms, change in  symptoms or any other concerns ? ? ? ?Final Clinical Impressions(s) / ED Diagnoses  ? ?  ICD-10-CM   ?1. Allergic reaction, initial encounter  T78.40XA   ?  ?  ?ED Discharge Orders   ? ?      Ordered  ?  famotidine (PEPCID) 20 MG tablet  2 times daily       ? 12/24/21 0612  ?  cetirizine (ZYRTEC ALLERGY) 10 MG tablet  Daily       ? 12/24/21 0612  ? ?  ?  ? ?  ?  ? ? ?Discharge Instructions Discussed with and Provided to Patient:  ? ? ? ?Discharge Instructions   ? ?  ?This might have been allergic reaction.  We will treat you as such with Zyrtec and Pepcid.  Take medications as prescribed.  Return for new or worsening symptoms. ? ? ? ? ?  ?Roxy Horseman, PA-C ?12/24/21 0622 ? ?  ?Zadie Rhine, MD ?12/24/21 [phone] ? ?

## 2021-12-24 NOTE — Discharge Instructions (Addendum)
This might have been allergic reaction.  We will treat you as such with Zyrtec and Pepcid.  Take medications as prescribed.  Return for new or worsening symptoms. ?

## 2021-12-24 NOTE — ED Triage Notes (Addendum)
BIBA ?Per EMS: Pt presents w/ hypertension. Pt c/o waking up "feeling different." Thought she was having allergic reaction, but EMS believes it is just her high BP  ?Pt takes Labetalol  ?Shellfish allergy, has not eaten anything w/ shellfish  ?Pt reports to this RN her tongue feels swollen and she feels like her face is tingling.  ?

## 2021-12-25 ENCOUNTER — Telehealth: Payer: Self-pay

## 2021-12-25 NOTE — Telephone Encounter (Signed)
Transition Care Management Unsuccessful Follow-up Telephone Call ? ?Date of discharge and from where:  12/24/2021 from [facility] ED ? ?Attempts:  1st Attempt ? ?Reason for unsuccessful TCM follow-up call:  Left voice message ? ? ? ?

## 2021-12-26 NOTE — Telephone Encounter (Signed)
Transition Care Management Follow-up Telephone Call ?Date of discharge and from where: 12/24/2021-[facility] ED ?How have you been since you were released from the hospital? Pt stated she is doing fine.  ?Any questions or concerns? No ? ?Items Reviewed: ?Did the pt receive and understand the discharge instructions provided? Yes  ?Medications obtained and verified? Yes  ?Other? No  ?Any new allergies since your discharge? No  ?Dietary orders reviewed? No ?Do you have support at home? Yes  ? ?Home Care and Equipment/Supplies: ?Were home health services ordered? not applicable ?If so, what is the name of the agency? N/A  ?Has the agency set up a time to come to the patient's home? not applicable ?Were any new equipment or medical supplies ordered?  No ?What is the name of the medical supply agency? N/A ?Were you able to get the supplies/equipment? not applicable ?Do you have any questions related to the use of the equipment or supplies? No ? ?Functional Questionnaire: (I = Independent and D = Dependent) ?ADLs: I ? ?Bathing/Dressing- I ? ?Meal Prep- I ? ?Eating- I ? ?Maintaining continence- I ? ?Transferring/Ambulation- I ? ?Managing Meds- I ? ?Follow up appointments reviewed: ? ?PCP Hospital f/u appt confirmed? No   ?Specialist Hospital f/u appt confirmed? No  Are transportation arrangements needed? No  ?If their condition worsens, is the pt aware to call PCP or go to the Emergency Dept.? Yes ?Was the patient provided with contact information for the PCP's office or ED? Yes ?Was to pt encouraged to call back with questions or concerns? Yes  ?

## 2021-12-27 ENCOUNTER — Other Ambulatory Visit: Payer: Self-pay | Admitting: Family Medicine

## 2021-12-27 DIAGNOSIS — O0990 Supervision of high risk pregnancy, unspecified, unspecified trimester: Secondary | ICD-10-CM

## 2022-01-08 ENCOUNTER — Other Ambulatory Visit (INDEPENDENT_AMBULATORY_CARE_PROVIDER_SITE_OTHER): Payer: Self-pay | Admitting: Primary Care

## 2022-01-08 ENCOUNTER — Other Ambulatory Visit (HOSPITAL_COMMUNITY): Payer: Self-pay

## 2022-01-08 DIAGNOSIS — G44021 Chronic cluster headache, intractable: Secondary | ICD-10-CM

## 2022-01-08 MED ORDER — NAPROXEN 500 MG PO TABS
500.0000 mg | ORAL_TABLET | Freq: Two times a day (BID) | ORAL | 0 refills | Status: DC
  Filled 2022-01-08: qty 60, 30d supply, fill #0

## 2022-01-08 NOTE — Telephone Encounter (Signed)
Sent to PCP ?

## 2022-01-15 ENCOUNTER — Other Ambulatory Visit (INDEPENDENT_AMBULATORY_CARE_PROVIDER_SITE_OTHER): Payer: Self-pay | Admitting: Primary Care

## 2022-01-15 DIAGNOSIS — O24112 Pre-existing diabetes mellitus, type 2, in pregnancy, second trimester: Secondary | ICD-10-CM

## 2022-01-16 NOTE — Telephone Encounter (Signed)
Requested Prescriptions  ?Pending Prescriptions Disp Refills  ?? NIFEdipine (ADALAT CC) 60 MG 24 hr tablet [Pharmacy Med Name: nifedipine ER 60 mg tablet,extended release] 180 tablet 1  ?  Sig: TAKE 1 TABLET BY MOUTH 2 TIMES DAILY  ?  ? Cardiovascular: Calcium Channel Blockers 2 Passed - 01/15/2022  8:04 AM  ?  ?  Passed - Last BP in normal range  ?  BP Readings from Last 1 Encounters:  ?12/24/21 127/73  ?   ?  ?  Passed - Last Heart Rate in normal range  ?  Pulse Readings from Last 1 Encounters:  ?12/24/21 79  ?   ?  ?  Passed - Valid encounter within last 6 months  ?  Recent Outpatient Visits   ?      ? 4 weeks ago Abscess of anal and rectal regions  ? [facility] Kerin Perna, NP  ? 4 months ago Chronic hypertension  ? [facility] Kerin Perna, NP  ? 4 months ago Type 2 diabetes mellitus with long term insulin  ? [place], RPH-CPP  ? 6 months ago Type 2 diabetes mellitus with long term insulin  ? [facility] Kerin Perna, NP  ? 1 year ago Type 2 diabetes mellitus without complication, without long-term current use of insulin ([place])  ? [facility] Kerin Perna, NP  ?  ?  ? ?  ?  ?  ? ? ?

## 2022-02-07 ENCOUNTER — Ambulatory Visit: Payer: Medicaid Other

## 2022-02-07 ENCOUNTER — Other Ambulatory Visit (INDEPENDENT_AMBULATORY_CARE_PROVIDER_SITE_OTHER): Payer: Self-pay | Admitting: Primary Care

## 2022-02-07 DIAGNOSIS — G44021 Chronic cluster headache, intractable: Secondary | ICD-10-CM

## 2022-02-08 ENCOUNTER — Other Ambulatory Visit (HOSPITAL_COMMUNITY): Payer: Self-pay

## 2022-02-08 MED ORDER — NAPROXEN 500 MG PO TABS
500.0000 mg | ORAL_TABLET | Freq: Two times a day (BID) | ORAL | 0 refills | Status: DC
  Filled 2022-02-08: qty 60, 30d supply, fill #0

## 2022-02-08 NOTE — Telephone Encounter (Signed)
Routed to PCP 

## 2022-02-10 ENCOUNTER — Other Ambulatory Visit (HOSPITAL_COMMUNITY): Payer: Self-pay

## 2022-03-02 ENCOUNTER — Encounter (INDEPENDENT_AMBULATORY_CARE_PROVIDER_SITE_OTHER): Payer: Self-pay | Admitting: Primary Care

## 2022-03-02 ENCOUNTER — Other Ambulatory Visit: Payer: Self-pay | Admitting: Pharmacist

## 2022-03-02 ENCOUNTER — Ambulatory Visit (INDEPENDENT_AMBULATORY_CARE_PROVIDER_SITE_OTHER): Payer: Medicaid Other | Admitting: Primary Care

## 2022-03-02 VITALS — BP 134/80 | HR 78 | Temp 98.0°F | Ht 63.0 in | Wt 231.0 lb

## 2022-03-02 DIAGNOSIS — M62830 Muscle spasm of back: Secondary | ICD-10-CM | POA: Diagnosis not present

## 2022-03-02 DIAGNOSIS — O24112 Pre-existing diabetes mellitus, type 2, in pregnancy, second trimester: Secondary | ICD-10-CM | POA: Diagnosis not present

## 2022-03-02 DIAGNOSIS — E119 Type 2 diabetes mellitus without complications: Secondary | ICD-10-CM

## 2022-03-02 DIAGNOSIS — Z760 Encounter for issue of repeat prescription: Secondary | ICD-10-CM | POA: Diagnosis not present

## 2022-03-02 DIAGNOSIS — Z0000 Encounter for general adult medical examination without abnormal findings: Secondary | ICD-10-CM | POA: Diagnosis not present

## 2022-03-02 DIAGNOSIS — I10 Essential (primary) hypertension: Secondary | ICD-10-CM

## 2022-03-02 LAB — POCT GLYCOSYLATED HEMOGLOBIN (HGB A1C): Hemoglobin A1C: 8 % — AB (ref 4.0–5.6)

## 2022-03-02 MED ORDER — OZEMPIC (0.25 OR 0.5 MG/DOSE) 2 MG/3ML ~~LOC~~ SOPN: PEN_INJECTOR | SUBCUTANEOUS | 1 refills | Status: DC

## 2022-03-02 MED ORDER — LABETALOL HCL 100 MG PO TABS: 100.0000 mg | ORAL_TABLET | Freq: Two times a day (BID) | ORAL | 1 refills | Status: DC

## 2022-03-02 MED ORDER — NIFEDIPINE ER 60 MG PO TB24: 60.0000 mg | ORAL_TABLET | Freq: Two times a day (BID) | ORAL | 1 refills | Status: DC

## 2022-03-02 MED ORDER — CYCLOBENZAPRINE HCL 10 MG PO TABS: 10.0000 mg | ORAL_TABLET | Freq: Three times a day (TID) | ORAL | 1 refills | Status: DC | PRN

## 2022-03-02 NOTE — Progress Notes (Signed)
[facility]  Patient presents to clinic today for annual exam.  Patient is fasting for labs.  Acute Concerns: None   Chronic Issues: None   Health Maintenance: Immunizations -- Up to date  PAP -- 02/27/21 Bone Density -- N/A  HIV/Hep C Screening -- HIV Non Reactive (01/01 0302) , HCV Ab 0.0 - 0.9 s/co ratio 0.1 (01/01 0302) ,    Past Medical History:  Diagnosis Date   Abscess 09/02/2020   Allergy    Anemia 2015   Asthma    as child   Chronic hypertension with superimposed preeclampsia 05/24/2021   Diabetes mellitus without complication (HCC) 2015   DKA (diabetic ketoacidosis) (HCC) 09/02/2020   Eczema    Hypertension 09/2013    Past Surgical History:  Procedure Laterality Date   Abcess on back      Current Outpatient Medications on File Prior to Visit  Medication Sig Dispense Refill   acetaminophen (TYLENOL) 500 MG tablet Take 1 tablet (500 mg total) by mouth every 6 (six) hours as needed. 30 tablet 2   cetirizine (ZYRTEC ALLERGY) 10 MG tablet Take 1 tablet (10 mg total) by mouth daily. 30 tablet 0   famotidine (PEPCID) 20 MG tablet Take 1 tablet (20 mg total) by mouth 2 (two) times daily. 30 tablet 0   labetalol (NORMODYNE) 100 MG tablet Take 1 tablet (100 mg total) by mouth 2 (two) times daily. 180 tablet 1   LANTUS SOLOSTAR 100 UNIT/ML Solostar Pen Inject 16 Units into the skin 2 (two) times daily. 15 mL 3   naproxen (NAPROSYN) 500 MG tablet Take 1 tablet (500 mg total) by mouth 2 (two) times daily with a meal. 60 tablet 0   nortriptyline (PAMELOR) 10 MG capsule Take 1 capsule (10 mg total) by mouth at bedtime. 30 capsule 5   Prenat-FeFmCb-DSS-FA-DHA w/o A (CITRANATAL HARMONY) 27-1-260 MG CAPS Take 1 tablet by mouth daily.     tiZANidine (ZANAFLEX) 2 MG tablet Take 1 tablet (2 mg total) by mouth every 8 (eight) hours as needed for muscle spasms. 90 tablet 5   NIFEdipine (ADALAT CC) 60 MG 24 hr tablet TAKE 1 TABLET BY MOUTH 2 TIMES DAILY 180  tablet 0   [DISCONTINUED] dicyclomine (BENTYL) 20 MG tablet Take 1 tablet (20 mg total) by mouth 2 (two) times daily. 20 tablet 0   [DISCONTINUED] fluticasone (FLONASE) 50 MCG/ACT nasal spray Place 2 sprays into both nostrils daily. 1 g 1   [DISCONTINUED] ipratropium (ATROVENT) 0.06 % nasal spray Place 2 sprays into both nostrils 4 (four) times daily. 15 mL 1   [DISCONTINUED] omeprazole (PRILOSEC) 20 MG capsule Take 1 capsule (20 mg total) by mouth daily. 14 capsule 0   No current facility-administered medications on file prior to visit.    Allergies  Allergen Reactions   Iodides Anaphylaxis and Itching   Morphine And Related Anaphylaxis and Itching   Shellfish Allergy Anaphylaxis and Itching    Pt reports "watery eyes"   Ultram [Tramadol Hcl] Hives    Hives and swollen lips     Family History  Problem Relation Age of Onset   Migraines Mother    Hypertension Mother    Heart disease Mother 73       CHF   Diabetes Mother    Healthy Father    Sickle cell anemia Brother    Migraines Maternal Grandmother    Diabetes Maternal Grandmother    Heart disease Maternal Grandmother    Hypertension Maternal  Grandfather    Diabetes Maternal Grandfather    Heart disease Maternal Grandfather     Social History   Socioeconomic History   Marital status: Significant Other    Spouse name: Not on file   Number of children: Not on file   Years of education: Not on file   Highest education level: Not on file  Occupational History   Not on file  Tobacco Use   Smoking status: Former    Packs/day: 0.50    Years: 2.00    Total pack years: 1.00    Types: Cigarettes    Quit date: 09/08/2013    Years since quitting: 8.4   Smokeless tobacco: Never  Vaping Use   Vaping Use: Never used  Substance and Sexual Activity   Alcohol use: No    Alcohol/week: 0.0 standard drinks of alcohol   Drug use: No   Sexual activity: Yes    Birth control/protection: None    Comment: 1st intercourse 25 yo--1  partner ( has been same partner 3 yrs)   Other Topics Concern   Not on file  Social History Narrative   Works at [employer] Resource Strain: Not on file  Food Insecurity: Food Insecurity Present (05/26/2021)   Hunger Vital Sign    Worried About Running Out of Food in the Last Year: Sometimes true    Ran Out of Food in the Last Year: Sometimes true  Transportation Needs: No Transportation Needs (05/26/2021)   PRAPARE - Administrator, Civil Service (Medical): No    Lack of Transportation (Non-Medical): No  Physical Activity: Not on file  Stress: Not on file  Social Connections: Not on file  Intimate Partner Violence: Not on file    Review of Systems  Musculoskeletal:  Positive for back pain.       Muscle spasm   Skin:        Open cyst resoled with abt's and cream    BP 134/80   Pulse 78   Temp 98 F (36.7 C) (Oral)   Ht 5\' 3"  (1.6 m)   Wt 231 lb (104.8 kg)   LMP 03/01/2022 (Exact Date)   SpO2 98%   Breastfeeding No   BMI 40.92 kg/m   Physical Exam Vitals reviewed.  Constitutional:      Appearance: Misty [name] is obese.  HENT:     Head: Normocephalic.     Right Ear: Tympanic membrane and external ear normal.     Left Ear: Tympanic membrane and external ear normal.     Nose: Nose normal.  Eyes:     Extraocular Movements: Extraocular movements intact.     Conjunctiva/sclera: Conjunctivae normal.     Pupils: Pupils are equal, round, and reactive to light.  Cardiovascular:     Rate and Rhythm: Normal rate and regular rhythm.  Pulmonary:     Effort: Pulmonary effort is normal.     Breath sounds: Normal breath sounds.  Abdominal:     General: Abdomen is flat. There is distension.  Musculoskeletal:        General: Normal range of motion.     Cervical back: Normal range of motion and neck supple.  Skin:    General: Skin is warm and dry.  Neurological:     Mental Status: Misty [name] is alert and oriented to person, place, and  time.  Psychiatric:        Mood and Affect: Mood normal.  Behavior: Behavior normal.        Thought Content: Thought content normal.   Recent Results (from the past 2160 hour(s))  CBG monitoring, ED     Status: Abnormal   Collection Time: 12/24/21  4:58 AM  Result Value Ref Range   Glucose-Capillary 217 (H) 70 - 99 mg/dL    Comment: Glucose reference range applies only to samples taken after fasting for at least 8 hours.  HgB A1c     Status: Abnormal   Collection Time: 03/02/22 10:26 AM  Result Value Ref Range   Hemoglobin A1C 8.0 (A) 4.0 - 5.6 %   HbA1c POC (<> result, manual entry)     HbA1c, POC (prediabetic range)     HbA1c, POC (controlled diabetic range)      Assessment/Plan: Misty [name] was seen today for diabetes.  Diagnoses and all orders for this visit:   Misty [name] was seen today for diabetes.  Diagnoses and all orders for this visit: Muscle spasm of back -     cyclobenzaprine (FLEXERIL) 10 MG tablet; Take 1 tablet (10 mg total) by mouth 3 (three) times daily as needed for muscle spasms.  Essential hypertension Counseled on blood pressure goal of less than 130/80, low-sodium, DASH diet, medication compliance, 150 minutes of moderate intensity exercise per week. Discussed medication compliance, adverse effects.  -     NIFEdipine (ADALAT CC) 60 MG 24 hr tablet; Take 1 tablet (60 mg total) by mouth 2 (two) times daily. -     labetalol (NORMODYNE) 100 MG tablet; Take 1 tablet (100 mg total) by mouth 2 (two) times daily. -     Comprehensive metabolic panel  Annual physical exam Completed  Morbid obesity (HCC) Obesity is 30-39 indicating an excess in caloric intake or underlining conditions. This may lead to other co-morbidities. Lifestyle modifications of diet and exercise may reduce obesity.    Type 2 diabetes mellitus with long term insulin A1C- 8.0 previously 6.4  ADA recommends the following therapeutic goals for glycemic control related to A1c measurements:  Goal of therapy: Less than 6.5 hemoglobin A1c.  Reference clinical practice recommendations. Foods that are high in carbohydrates are the following rice, potatoes, breads, sugars, and pastas.  Reduction in the intake (eating) will assist in lowering your blood sugars.  Prescribed Ozempic  -     Comprehensive  panel -     Lipid panel -     CBC with Differential/Platelet  Medication refill -     NIFEdipine (ADALAT CC) 60 MG 24 hr tablet; Take 1 tablet (60 mg total) by mouth 2 (two) times daily. -     labetalol (NORMODYNE) 100 MG tablet; Take 1 tablet (100 mg total) by mouth 2 (two) times daily. -     cyclobenzaprine (FLEXERIL) 10 MG tablet; Take 1 tablet (10 mg total) by mouth 3 (three) times daily as needed for muscle spasms.      This visit occurred during the SARS-CoV-2 public health emergency.  Safety protocols were in place, including screening questions prior to the visit, additional usage of staff PPE, and extensive cleaning of exam room while observing appropriate contact time as indicated for disinfecting solutions.   This note has been created with Education officer, environmental. Any transcriptional errors are unintentional.   Grayce Sessions, NP 03/02/2022, 10:28 AM

## 2022-03-02 NOTE — Patient Instructions (Addendum)
Semaglutide Injection What is this medication? SEMAGLUTIDE (SEM a GLOO tide) treats type 2 diabetes. It works by increasing insulin levels in your body, which decreases your blood sugar (glucose). It also reduces the amount of sugar released into the blood and slows down your digestion. It can also be used to lower the risk of heart attack and stroke in people with type 2 diabetes. Changes to diet and exercise are often combined with this medication. This medicine may be used for other purposes; ask your health care provider or pharmacist if you have questions. COMMON BRAND NAME(S): OZEMPIC What should I tell my care team before I take this medication? They need to know if you have any of these conditions: Endocrine tumors (MEN 2) or if someone in your family had these tumors Eye disease, vision problems History of pancreatitis Kidney disease Stomach problems Thyroid cancer or if someone in your family had thyroid cancer An unusual or allergic reaction to semaglutide, other medications, foods, dyes, or preservatives Pregnant or trying to get pregnant Breast-feeding How should I use this medication? This medication is for injection under the skin of your upper leg (thigh), stomach area, or upper arm. It is given once every week (every 7 days). You will be taught how to prepare and give this medication. Use exactly as directed. Take your medication at regular intervals. Do not take it more often than directed. If you use this medication with insulin, you should inject this medication and the insulin separately. Do not mix them together. Do not give the injections right next to each other. Change (rotate) injection sites with each injection. It is important that you put your used needles and syringes in a special sharps container. Do not put them in a trash can. If you do not have a sharps container, call your pharmacist or care team to get one. A special MedGuide will be given to you by the  pharmacist with each prescription and refill. Be sure to read this information carefully each time. This medication comes with INSTRUCTIONS FOR USE. Ask your pharmacist for directions on how to use this medication. Read the information carefully. Talk to your pharmacist or care team if you have questions. Talk to your care team about the use of this medication in children. Special care may be needed. Overdosage: If you think you have taken too much of this medicine contact a poison control center or emergency room at once. NOTE: This medicine is only for you. Do not share this medicine with others. What if I miss a dose? If you miss a dose, take it as soon as you can within 5 days after the missed dose. Then take your next dose at your regular weekly time. If it has been longer than 5 days after the missed dose, do not take the missed dose. Take the next dose at your regular time. Do not take double or extra doses. If you have questions about a missed dose, contact your care team for advice. What may interact with this medication? Other medications for diabetes Many medications may cause changes in blood sugar, these include: Alcohol containing beverages Antiviral medications for HIV or AIDS Aspirin and aspirin-like medications Certain medications for blood pressure, heart disease, irregular heart beat Chromium Diuretics Female hormones, such as estrogens or progestins, birth control pills Fenofibrate Gemfibrozil Isoniazid Lanreotide Female hormones or anabolic steroids MAOIs like Carbex, Eldepryl, Marplan, Nardil, and Parnate Medications for weight loss Medications for allergies, asthma, cold, or cough Medications for depression,   anxiety, or psychotic disturbances Niacin Nicotine NSAIDs, medications for pain and inflammation, like ibuprofen or naproxen Octreotide Pasireotide Pentamidine Phenytoin Probenecid Quinolone antibiotics such as ciprofloxacin, levofloxacin, ofloxacin Some  herbal dietary supplements Steroid medications such as prednisone or cortisone Sulfamethoxazole; trimethoprim Thyroid hormones Some medications can hide the warning symptoms of low blood sugar (hypoglycemia). You may need to monitor your blood sugar more closely if you are taking one of these medications. These include: Beta-blockers, often used for high blood pressure or heart problems (examples include atenolol, metoprolol, propranolol) Clonidine Guanethidine Reserpine This list may not describe all possible interactions. Give your health care provider a list of all the medicines, herbs, non-prescription drugs, or dietary supplements you use. Also tell them if you smoke, drink alcohol, or use illegal drugs. Some items may interact with your medicine. What should I watch for while using this medication? Visit your care team for regular checks on your progress. Drink plenty of fluids while taking this medication. Check with your care team if you get an attack of severe diarrhea, nausea, and vomiting. The loss of too much body fluid can make it dangerous for you to take this medication. A test called the HbA1C (A1C) will be monitored. This is a simple blood test. It measures your blood sugar control over the last 2 to 3 months. You will receive this test every 3 to 6 months. Learn how to check your blood sugar. Learn the symptoms of low and high blood sugar and how to manage them. Always carry a quick-source of sugar with you in case you have symptoms of low blood sugar. Examples include hard sugar candy or glucose tablets. Make sure others know that you can choke if you eat or drink when you develop serious symptoms of low blood sugar, such as seizures or unconsciousness. They must get medical help at once. Tell your care team if you have high blood sugar. You might need to change the dose of your medication. If you are sick or exercising more than usual, you might need to change the dose of your  medication. Do not skip meals. Ask your care team if you should avoid alcohol. Many nonprescription cough and cold products contain sugar or alcohol. These can affect blood sugar. Pens should never be shared. Even if the needle is changed, sharing may result in passing of viruses like hepatitis or HIV. Wear a medical ID bracelet or chain, and carry a card that describes your disease and details of your medication and dosage times. Do not become pregnant while taking this medication. Women should inform their care team if they wish to become pregnant or think they might be pregnant. There is a potential for serious side effects to an unborn child. Talk to your care team for more information. What side effects may I notice from receiving this medication? Side effects that you should report to your care team as soon as possible: Allergic reactions--skin rash, itching, hives, swelling of the face, lips, tongue, or throat Change in vision Dehydration--increased thirst, dry mouth, feeling faint or lightheaded, headache, dark yellow or brown urine Gallbladder problems--severe stomach pain, nausea, vomiting, fever Heart palpitations--rapid, pounding, or irregular heartbeat Kidney injury--decrease in the amount of urine, swelling of the ankles, hands, or feet Pancreatitis--severe stomach pain that spreads to your back or gets worse after eating or when touched, fever, nausea, vomiting Thyroid cancer--new mass or lump in the neck, pain or trouble swallowing, trouble breathing, hoarseness Side effects that usually do not require medical  attention (report to your care team if they continue or are bothersome): Diarrhea Loss of appetite Nausea Stomach pain Vomiting This list may not describe all possible side effects. Call your doctor for medical advice about side effects. You may report side effects to FDA at 1-800-FDA-1088. Where should I keep my medication? Keep out of the reach of children. Store  unopened pens in a refrigerator between 2 and 8 degrees C (36 and 46 degrees F). Do not freeze. Protect from light and heat. After you first use the pen, it can be stored for 56 days at room temperature between 15 and 30 degrees C (59 and 86 degrees F) or in a refrigerator. Throw away your used pen after 56 days or after the expiration date, whichever comes first. Do not store your pen with the needle attached. If the needle is left on, medication may leak from the pen. NOTE: This sheet is a summary. It may not cover all possible information. If you have questions about this medicine, talk to your doctor, pharmacist, or health care provider.  2023 Elsevier/Gold Standard (2020-11-24 00:00:00) Semaglutide Injection What is this medication? SEMAGLUTIDE (SEM a GLOO tide) treats type 2 diabetes. It works by increasing insulin levels in your body, which decreases your blood sugar (glucose). It also reduces the amount of sugar released into the blood and slows down your digestion. It can also be used to lower the risk of heart attack and stroke in people with type 2 diabetes. Changes to diet and exercise are often combined with this medication. This medicine may be used for other purposes; ask your health care provider or pharmacist if you have questions. COMMON BRAND NAME(S): OZEMPIC What should I tell my care team before I take this medication? They need to know if you have any of these conditions: Endocrine tumors (MEN 2) or if someone in your family had these tumors Eye disease, vision problems History of pancreatitis Kidney disease Stomach problems Thyroid cancer or if someone in your family had thyroid cancer An unusual or allergic reaction to semaglutide, other medications, foods, dyes, or preservatives Pregnant or trying to get pregnant Breast-feeding How should I use this medication? This medication is for injection under the skin of your upper leg (thigh), stomach area, or upper arm. It is  given once every week (every 7 days). You will be taught how to prepare and give this medication. Use exactly as directed. Take your medication at regular intervals. Do not take it more often than directed. If you use this medication with insulin, you should inject this medication and the insulin separately. Do not mix them together. Do not give the injections right next to each other. Change (rotate) injection sites with each injection. It is important that you put your used needles and syringes in a special sharps container. Do not put them in a trash can. If you do not have a sharps container, call your pharmacist or care team to get one. A special MedGuide will be given to you by the pharmacist with each prescription and refill. Be sure to read this information carefully each time. This medication comes with INSTRUCTIONS FOR USE. Ask your pharmacist for directions on how to use this medication. Read the information carefully. Talk to your pharmacist or care team if you have questions. Talk to your care team about the use of this medication in children. Special care may be needed. Overdosage: If you think you have taken too much of this medicine contact a poison  control center or emergency room at once. NOTE: This medicine is only for you. Do not share this medicine with others. What if I miss a dose? If you miss a dose, take it as soon as you can within 5 days after the missed dose. Then take your next dose at your regular weekly time. If it has been longer than 5 days after the missed dose, do not take the missed dose. Take the next dose at your regular time. Do not take double or extra doses. If you have questions about a missed dose, contact your care team for advice. What may interact with this medication? Other medications for diabetes Many medications may cause changes in blood sugar, these include: Alcohol containing beverages Antiviral medications for HIV or AIDS Aspirin and aspirin-like  medications Certain medications for blood pressure, heart disease, irregular heart beat Chromium Diuretics Female hormones, such as estrogens or progestins, birth control pills Fenofibrate Gemfibrozil Isoniazid Lanreotide Female hormones or anabolic steroids MAOIs like Carbex, Eldepryl, Marplan, Nardil, and Parnate Medications for weight loss Medications for allergies, asthma, cold, or cough Medications for depression, anxiety, or psychotic disturbances Niacin Nicotine NSAIDs, medications for pain and inflammation, like ibuprofen or naproxen Octreotide Pasireotide Pentamidine Phenytoin Probenecid Quinolone antibiotics such as ciprofloxacin, levofloxacin, ofloxacin Some herbal dietary supplements Steroid medications such as prednisone or cortisone Sulfamethoxazole; trimethoprim Thyroid hormones Some medications can hide the warning symptoms of low blood sugar (hypoglycemia). You may need to monitor your blood sugar more closely if you are taking one of these medications. These include: Beta-blockers, often used for high blood pressure or heart problems (examples include atenolol, metoprolol, propranolol) Clonidine Guanethidine Reserpine This list may not describe all possible interactions. Give your health care provider a list of all the medicines, herbs, non-prescription drugs, or dietary supplements you use. Also tell them if you smoke, drink alcohol, or use illegal drugs. Some items may interact with your medicine. What should I watch for while using this medication? Visit your care team for regular checks on your progress. Drink plenty of fluids while taking this medication. Check with your care team if you get an attack of severe diarrhea, nausea, and vomiting. The loss of too much body fluid can make it dangerous for you to take this medication. A test called the HbA1C (A1C) will be monitored. This is a simple blood test. It measures your blood sugar control over the last 2 to  3 months. You will receive this test every 3 to 6 months. Learn how to check your blood sugar. Learn the symptoms of low and high blood sugar and how to manage them. Always carry a quick-source of sugar with you in case you have symptoms of low blood sugar. Examples include hard sugar candy or glucose tablets. Make sure others know that you can choke if you eat or drink when you develop serious symptoms of low blood sugar, such as seizures or unconsciousness. They must get medical help at once. Tell your care team if you have high blood sugar. You might need to change the dose of your medication. If you are sick or exercising more than usual, you might need to change the dose of your medication. Do not skip meals. Ask your care team if you should avoid alcohol. Many nonprescription cough and cold products contain sugar or alcohol. These can affect blood sugar. Pens should never be shared. Even if the needle is changed, sharing may result in passing of viruses like hepatitis or HIV. Wear a medical  ID bracelet or chain, and carry a card that describes your disease and details of your medication and dosage times. Do not become pregnant while taking this medication. Women should inform their care team if they wish to become pregnant or think they might be pregnant. There is a potential for serious side effects to an unborn child. Talk to your care team for more information. What side effects may I notice from receiving this medication? Side effects that you should report to your care team as soon as possible: Allergic reactions--skin rash, itching, hives, swelling of the face, lips, tongue, or throat Change in vision Dehydration--increased thirst, dry mouth, feeling faint or lightheaded, headache, dark yellow or brown urine Gallbladder problems--severe stomach pain, nausea, vomiting, fever Heart palpitations--rapid, pounding, or irregular heartbeat Kidney injury--decrease in the amount of urine, swelling  of the ankles, hands, or feet Pancreatitis--severe stomach pain that spreads to your back or gets worse after eating or when touched, fever, nausea, vomiting Thyroid cancer--new mass or lump in the neck, pain or trouble swallowing, trouble breathing, hoarseness Side effects that usually do not require medical attention (report to your care team if they continue or are bothersome): Diarrhea Loss of appetite Nausea Stomach pain Vomiting This list may not describe all possible side effects. Call your doctor for medical advice about side effects. You may report side effects to FDA at 1-800-FDA-1088. Where should I keep my medication? Keep out of the reach of children. Store unopened pens in a refrigerator between 2 and 8 degrees C (36 and 46 degrees F). Do not freeze. Protect from light and heat. After you first use the pen, it can be stored for 56 days at room temperature between 15 and 30 degrees C (59 and 86 degrees F) or in a refrigerator. Throw away your used pen after 56 days or after the expiration date, whichever comes first. Do not store your pen with the needle attached. If the needle is left on, medication may leak from the pen. NOTE: This sheet is a summary. It may not cover all possible information. If you have questions about this medicine, talk to your doctor, pharmacist, or health care provider.  2023 Elsevier/Gold Standard (2020-11-24 00:00:00)

## 2022-03-03 LAB — COMPREHENSIVE METABOLIC PANEL
ALT: 22 IU/L (ref 0–32)
AST: 20 IU/L (ref 0–40)
Albumin/Globulin Ratio: 1.5 (ref 1.2–2.2)
Albumin: 4.6 g/dL (ref 3.8–4.8)
Alkaline Phosphatase: 88 IU/L (ref 44–121)
BUN/Creatinine Ratio: 12 (ref 9–23)
BUN: 10 mg/dL (ref 6–20)
Bilirubin Total: 0.3 mg/dL (ref 0.0–1.2)
CO2: 23 mmol/L (ref 20–29)
Calcium: 9.3 mg/dL (ref 8.7–10.2)
Chloride: 100 mmol/L (ref 96–106)
Creatinine, Ser: 0.82 mg/dL (ref 0.57–1.00)
Globulin, Total: 3 g/dL (ref 1.5–4.5)
Glucose: 89 mg/dL (ref 70–99)
Potassium: 4.4 mmol/L (ref 3.5–5.2)
Sodium: 140 mmol/L (ref 134–144)
Total Protein: 7.6 g/dL (ref 6.0–8.5)
eGFR: 98 mL/min/{1.73_m2} (ref >59)

## 2022-03-03 LAB — CBC WITH DIFFERENTIAL/PLATELET
Basophils Absolute: 0 10*3/uL (ref 0.0–0.2)
Basos: 1 %
EOS (ABSOLUTE): 0.1 10*3/uL (ref 0.0–0.4)
Eos: 3 %
Hematocrit: 36.7 % (ref 34.0–46.6)
Hemoglobin: 12.1 g/dL (ref 11.1–15.9)
Immature Grans (Abs): 0 10*3/uL (ref 0.0–0.1)
Immature Granulocytes: 0 %
Lymphocytes Absolute: 1.6 10*3/uL (ref 0.7–3.1)
Lymphs: 33 %
MCH: 30.3 pg (ref 26.6–33.0)
MCHC: 33 g/dL (ref 31.5–35.7)
MCV: 92 fL (ref 79–97)
Monocytes Absolute: 0.4 10*3/uL (ref 0.1–0.9)
Monocytes: 8 %
Neutrophils Absolute: 2.7 10*3/uL (ref 1.4–7.0)
Neutrophils: 55 %
Platelets: 341 10*3/uL (ref 150–450)
RBC: 4 x10E6/uL (ref 3.77–5.28)
RDW: 12.5 % (ref 11.7–15.4)
WBC: 4.8 10*3/uL (ref 3.4–10.8)

## 2022-03-03 LAB — LIPID PANEL
Chol/HDL Ratio: 2.1 ratio (ref 0.0–4.4)
Cholesterol, Total: 150 mg/dL (ref 100–199)
HDL: 73 mg/dL (ref >39)
LDL Chol Calc (NIH): 68 mg/dL (ref 0–99)
Triglycerides: 37 mg/dL (ref 0–149)
VLDL Cholesterol Cal: 9 mg/dL (ref 5–40)

## 2022-03-07 ENCOUNTER — Telehealth: Payer: Self-pay

## 2022-03-07 NOTE — Telephone Encounter (Signed)
Ozempic PA approved, [facility] aware.  Approved until 03/07/23

## 2022-03-30 ENCOUNTER — Other Ambulatory Visit (INDEPENDENT_AMBULATORY_CARE_PROVIDER_SITE_OTHER): Payer: Self-pay | Admitting: Primary Care

## 2022-03-30 DIAGNOSIS — O24112 Pre-existing diabetes mellitus, type 2, in pregnancy, second trimester: Secondary | ICD-10-CM

## 2022-03-30 NOTE — Telephone Encounter (Signed)
Sent to PCP ?

## 2022-04-10 ENCOUNTER — Ambulatory Visit: Payer: Medicaid Other | Admitting: Pharmacist

## 2022-04-10 ENCOUNTER — Other Ambulatory Visit: Payer: Self-pay | Admitting: Obstetrics & Gynecology

## 2022-04-10 DIAGNOSIS — O24112 Pre-existing diabetes mellitus, type 2, in pregnancy, second trimester: Secondary | ICD-10-CM

## 2022-04-16 ENCOUNTER — Other Ambulatory Visit (INDEPENDENT_AMBULATORY_CARE_PROVIDER_SITE_OTHER): Payer: Self-pay | Admitting: Primary Care

## 2022-04-16 DIAGNOSIS — Z760 Encounter for issue of repeat prescription: Secondary | ICD-10-CM

## 2022-04-16 DIAGNOSIS — I10 Essential (primary) hypertension: Secondary | ICD-10-CM

## 2022-04-16 DIAGNOSIS — O24112 Pre-existing diabetes mellitus, type 2, in pregnancy, second trimester: Secondary | ICD-10-CM

## 2022-04-19 ENCOUNTER — Other Ambulatory Visit (INDEPENDENT_AMBULATORY_CARE_PROVIDER_SITE_OTHER): Payer: Self-pay | Admitting: Primary Care

## 2022-04-19 DIAGNOSIS — O24112 Pre-existing diabetes mellitus, type 2, in pregnancy, second trimester: Secondary | ICD-10-CM

## 2022-04-19 DIAGNOSIS — I10 Essential (primary) hypertension: Secondary | ICD-10-CM

## 2022-04-19 DIAGNOSIS — Z760 Encounter for issue of repeat prescription: Secondary | ICD-10-CM

## 2022-04-19 MED ORDER — LABETALOL HCL 100 MG PO TABS: 100.0000 mg | ORAL_TABLET | Freq: Two times a day (BID) | ORAL | 1 refills | Status: DC

## 2022-04-29 IMAGING — DX DG CHEST 1V PORT
1 series · 1 of 1 positions shown · non-contrast
Comparison: May 05, 2019

CLINICAL DATA: Fever, weakness and cough.

EXAM:
PORTABLE CHEST 1 VIEW

[chest ap]
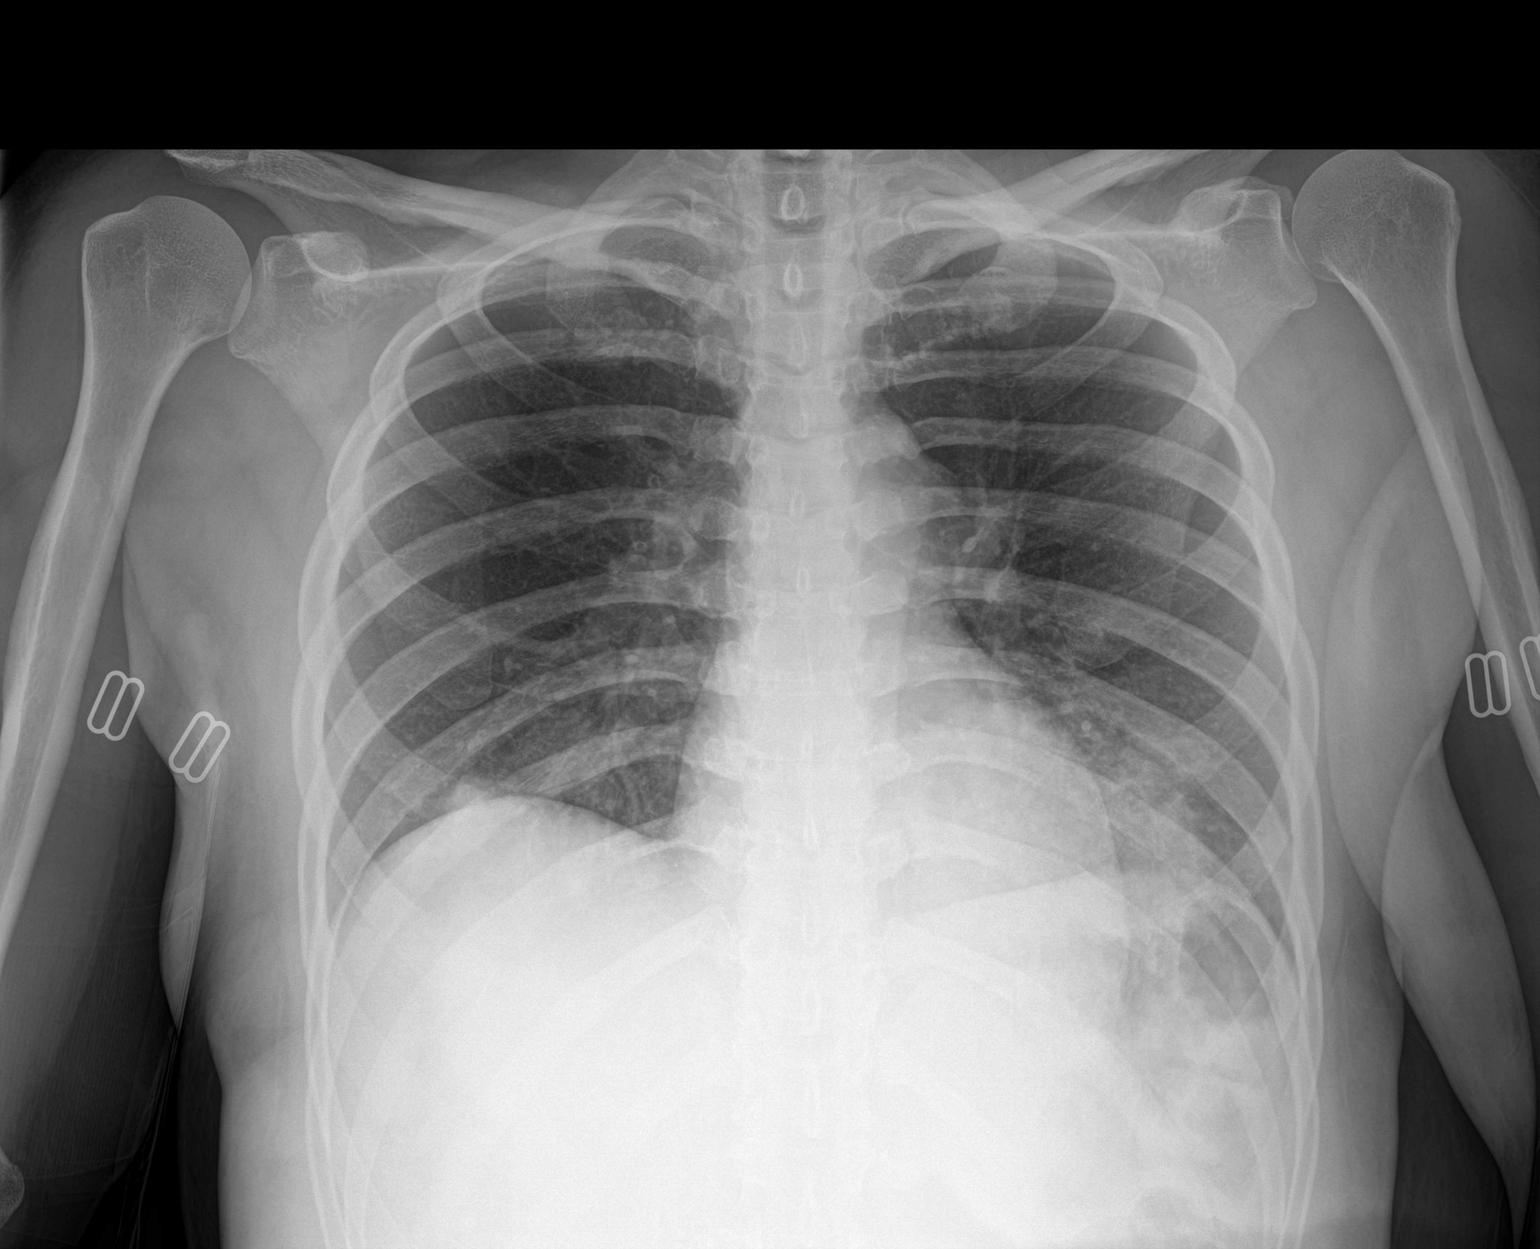

[1 of 1 positions shown; findings below may reference images not displayed]

FINDINGS: Mild areas of atelectasis and/or early infiltrate are seen within
the bilateral lung bases, left greater than right. There is no
evidence of a pleural effusion or pneumothorax. The heart size and
mediastinal contours are within normal limits. The visualized
skeletal structures are unremarkable.
IMPRESSION: Mild bibasilar atelectasis and/or early infiltrate, left greater
than right.

## 2022-05-10 ENCOUNTER — Ambulatory Visit: Payer: Medicaid Other | Admitting: Neurology

## 2022-05-22 ENCOUNTER — Encounter (INDEPENDENT_AMBULATORY_CARE_PROVIDER_SITE_OTHER): Payer: Self-pay | Admitting: Primary Care

## 2022-05-22 ENCOUNTER — Ambulatory Visit (INDEPENDENT_AMBULATORY_CARE_PROVIDER_SITE_OTHER): Payer: Medicaid Other | Admitting: Primary Care

## 2022-05-22 VITALS — BP 111/74 | HR 95 | Resp 16 | Wt 231.0 lb

## 2022-05-22 DIAGNOSIS — L989 Disorder of the skin and subcutaneous tissue, unspecified: Secondary | ICD-10-CM

## 2022-05-22 NOTE — Progress Notes (Signed)
[facility]  Misty [name], is a 31 y.o. female  IWL:[id number]  [id number]:[id number]  DOB - May 27, 1991  Chief Complaint  Patient presents with   Recurrent Skin Infections    Boil on right butt cheek Noticed it 2 days ago Pt states she has these boils before        Subjective:   Misty [name] is a 31 y.o. female here today for an acute visit. Pt felt a bump on her right inner cheek that was causing pain. She has not tried any otc products. There is no changes in soap, lotion or detergent.  Patient has No headache, No chest pain, No abdominal pain - No Nausea, No new weakness tingling or numbness, No Cough - shortness of breath  No problems updated.  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 30 tablet 0   cyclobenzaprine (FLEXERIL) 10 MG tablet Take 1 tablet (10 mg total) by mouth 3 (three) times daily as needed for muscle spasms. 90 tablet 1   labetalol (NORMODYNE) 100 MG tablet Take 1 tablet (100 mg total) by mouth 2 (two) times daily. 180 tablet 1   LANTUS SOLOSTAR 100 UNIT/ML Solostar Pen Inject 16 Units into the skin 2 (two) times daily. 15 mL 3   NIFEdipine (ADALAT CC) 60 MG 24 hr tablet Take 1 tablet (60 mg total) by mouth 2 (two) times daily. 180 tablet 1   nortriptyline (PAMELOR)  10 MG capsule Take 1 capsule (10 mg total) by mouth at bedtime. 30 capsule 5   Semaglutide,0.25 or 0.5MG /DOS, (OZEMPIC, 0.25 OR 0.5 MG/DOSE,) 2 MG/3ML SOPN Inject 0.25 mg subcutaneously once a week for 4 weeks. Then, increase to 0.5 mg once weekly. 3 mL 1   [DISCONTINUED] dicyclomine (BENTYL) 20 MG tablet Take 1 tablet (20 mg total) by mouth 2 (two) times daily. 20 tablet 0   [DISCONTINUED] fluticasone (FLONASE) 50 MCG/ACT nasal spray Place 2 sprays into both nostrils daily. 1 g 1   [DISCONTINUED] ipratropium (ATROVENT) 0.06 % nasal spray Place 2 sprays into both nostrils 4 (four) times daily. 15 mL 1   [DISCONTINUED] omeprazole (PRILOSEC) 20 MG capsule Take 1 capsule (20 mg total) by mouth daily. 14 capsule 0   No current facility-administered medications on file prior to visit.   Comprehensive ROS Pertinent positive and negative noted in HPI    Objective:   Vitals:   05/22/22 1348  BP: 111/74  Pulse: 95  Resp: 16  SpO2: 97%  Weight: 231 lb (104.8 kg)    Exam General appearance : Awake, alert, not in any distress. Speech Clear. Not toxic looking HEENT: Atraumatic and Normocephalic, pupils equally reactive to light and accomodation Neck: Supple, no JVD. No cervical lymphadenopathy.  Chest: Good air  entry bilaterally, no added sounds  CVS: S1 S2 regular, no murmurs.  Abdomen: Bowel sounds present, Non tender and not distended with no gaurding, rigidity or rebound. Extremities: B/L Lower Ext shows no edema, both legs are warm to touch Neurology: Awake alert, and oriented X 3, CN II-XII intact, Non focal Skin: No Rash. Flat nodule on right cheek. No discoloration. No discharge. No odor. No swelling   Data Review Lab Results  Component Value Date   HGBA1C 8.0 (A) 03/02/2022   HGBA1C 6.4 (A) 07/19/2021   HGBA1C 7.0 (A) 03/09/2021    Assessment & Plan  Misty [name] was seen today for recurrent skin infections.  Diagnoses and all orders for this visit:  Bumps on skin Flat nodule  on right cheek. No discoloration. No discharge. No odor. No swelling  No tx required    There are no diagnoses linked to this encounter.   Patient have been counseled extensively about nutrition and exercise. Other issues discussed during this visit include: low cholesterol diet, weight control and daily exercise, foot care, annual eye examinations at Ophthalmology, importance of adherence with medications and regular follow-up. We also discussed long term complications of uncontrolled diabetes and hypertension.   Keep scheduled appt  The patient was given clear instructions to go to ER or return to medical center if symptoms don't improve, worsen or new problems develop. The patient verbalized understanding. The patient was told to call to get lab results if they haven't heard anything in the next week.   This note has been created with [employer], quantity. Any transcriptional errors are unintentional.   Kerin Perna, NP 05/22/2022, 2:28 PM

## 2022-06-01 ENCOUNTER — Ambulatory Visit (INDEPENDENT_AMBULATORY_CARE_PROVIDER_SITE_OTHER): Payer: Medicaid Other | Admitting: Primary Care

## 2022-06-01 ENCOUNTER — Encounter (INDEPENDENT_AMBULATORY_CARE_PROVIDER_SITE_OTHER): Payer: Self-pay | Admitting: Primary Care

## 2022-06-01 VITALS — BP 122/78 | HR 92 | Resp 16 | Wt 233.6 lb

## 2022-06-01 DIAGNOSIS — Z760 Encounter for issue of repeat prescription: Secondary | ICD-10-CM

## 2022-06-01 DIAGNOSIS — Z23 Encounter for immunization: Secondary | ICD-10-CM

## 2022-06-01 DIAGNOSIS — I10 Essential (primary) hypertension: Secondary | ICD-10-CM

## 2022-06-01 DIAGNOSIS — M62830 Muscle spasm of back: Secondary | ICD-10-CM

## 2022-06-01 DIAGNOSIS — O24112 Pre-existing diabetes mellitus, type 2, in pregnancy, second trimester: Secondary | ICD-10-CM

## 2022-06-01 LAB — POCT GLYCOSYLATED HEMOGLOBIN (HGB A1C): HbA1c, POC (controlled diabetic range): 8.2 % — AB (ref 0.0–7.0)

## 2022-06-01 LAB — GLUCOSE, POCT (MANUAL RESULT ENTRY): POC Glucose: 173 mg/dl — AB (ref 70–99)

## 2022-06-01 MED ORDER — LANTUS SOLOSTAR 100 UNIT/ML ~~LOC~~ SOPN
PEN_INJECTOR | SUBCUTANEOUS | 11 refills | Status: DC
Start: 2022-06-01 — End: 2022-11-07

## 2022-06-01 MED ORDER — NAPROXEN 500 MG PO TABS: 500.0000 mg | ORAL_TABLET | Freq: Two times a day (BID) | ORAL | 1 refills | Status: DC

## 2022-06-01 MED ORDER — LABETALOL HCL 100 MG PO TABS: 100.0000 mg | ORAL_TABLET | Freq: Two times a day (BID) | ORAL | 1 refills | Status: DC

## 2022-06-01 MED ORDER — NIFEDIPINE ER 60 MG PO TB24: 60.0000 mg | ORAL_TABLET | Freq: Two times a day (BID) | ORAL | 1 refills | Status: DC

## 2022-06-01 NOTE — Patient Instructions (Signed)

## 2022-06-01 NOTE — Progress Notes (Unsigned)
**Note Misty-Identified via Obfuscation** Subjective:  Patient ID: Misty [name], female    DOB: 06-06-1991  Age: 31 y.o. MRN: [id number]  CC: Diabetes and Hypertension   HPI SRAVYA GLADYS presents for follow-up of diabetes. Patient does check blood sugar at home. Management of HTN -Patient has No headache, No chest pain, No abdominal pain - No Nausea, No new weakness tingling or numbness, No Cough - shortness of breath .  She is sexually active and does not use any birth control.  Asked patient was she ready to have another child she stated no.  Question how are you planning on preventing getting pregnant.  "I do not know ".  She also has chronic back pain and muscle spasms requesting a refill on Naprosyn.  Compliant with meds - Yes Checking CBGs? Yes  Fasting avg - 177-300  Postprandial average -  Exercising regularly? - Yes Watching carbohydrate intake? - Yes Neuropathy ? - No Hypoglycemic events - No  - Recovers with :   Pertinent ROS:  Polyuria - No Polydipsia - No Vision problems - No  Medications as noted below. Taking them regularly without complication/adverse reaction being reported today.   History Chantele has a past medical history of Abscess (09/02/2020), Allergy, Anemia (2015), Asthma, Chronic hypertension with superimposed preeclampsia (05/24/2021), Diabetes mellitus without complication ([place]) (123456), DKA (diabetic ketoacidosis) ([place]) (09/02/2020), Eczema, and Hypertension (09/2013).   She has a past surgical history that includes Abcess on back.   Her family history includes Diabetes in her maternal grandfather, maternal grandmother, and mother; Healthy in her father; Heart disease in her maternal grandfather and maternal grandmother; Heart disease (age of onset: 31) in her mother; Hypertension in her maternal grandfather and mother; Migraines in her maternal grandmother and mother; Sickle cell anemia in her brother.She reports that she quit smoking about 8 years ago. Her smoking use included cigarettes.  She has a 1.00 pack-year smoking history. She has never used smokeless tobacco. She reports that she does not drink alcohol and does not use drugs.  Current Outpatient Medications on File Prior to Visit  Medication Sig Dispense Refill   cetirizine (ZYRTEC ALLERGY) 10 MG tablet Take 1 tablet (10 mg total) by mouth daily. 30 tablet 0   cyclobenzaprine (FLEXERIL) 10 MG tablet Take 1 tablet (10 mg total) by mouth 3 (three) times daily as needed for muscle spasms. 90 tablet 1   labetalol (NORMODYNE) 100 MG tablet Take 1 tablet (100 mg total) by mouth 2 (two) times daily. 180 tablet 1   LANTUS SOLOSTAR 100 UNIT/ML Solostar Pen Inject 16 Units into the skin 2 (two) times daily. 15 mL 3   NIFEdipine (ADALAT CC) 60 MG 24 hr tablet Take 1 tablet (60 mg total) by mouth 2 (two) times daily. 180 tablet 1   nortriptyline (PAMELOR) 10 MG capsule Take 1 capsule (10 mg total) by mouth at bedtime. 30 capsule 5   Semaglutide,0.25 or 0.5MG /DOS, (OZEMPIC, 0.25 OR 0.5 MG/DOSE,) 2 MG/3ML SOPN Inject 0.25 mg subcutaneously once a week for 4 weeks. Then, increase to 0.5 mg once weekly. 3 mL 1   [DISCONTINUED] dicyclomine (BENTYL) 20 MG tablet Take 1 tablet (20 mg total) by mouth 2 (two) times daily. 20 tablet 0   [DISCONTINUED] fluticasone (FLONASE) 50 MCG/ACT nasal spray Place 2 sprays into both nostrils daily. 1 g 1   [DISCONTINUED] ipratropium (ATROVENT) 0.06 % nasal spray Place 2 sprays into both nostrils 4 (four) times daily. 15 mL 1   [DISCONTINUED] omeprazole (PRILOSEC) 20 MG capsule  Take 1 capsule (20 mg total) by mouth daily. 14 capsule 0   No current facility-administered medications on file prior to visit.    ROS Comprehensive ROS Pertinent positive and negative noted in HPI    Objective:  BP 122/78   Pulse 92   Resp 16   Wt 233 lb 9.6 oz (106 kg)   SpO2 97%   BMI 41.38 kg/m   BP Readings from Last 3 Encounters:  06/01/22 122/78  05/22/22 111/74  03/02/22 134/80    Wt Readings from Last 3  Encounters:  06/01/22 233 lb 9.6 oz (106 kg)  05/22/22 231 lb (104.8 kg)  03/02/22 231 lb (104.8 kg)    Physical exam: General: Vital signs reviewed.  Patient is well-developed and well-nourished, xx in no acute distress and cooperative with exam. Head: Normocephalic and atraumatic. Eyes: EOMI, conjunctivae normal, no scleral icterus. Neck: Supple, trachea midline, normal ROM, no JVD, masses, thyromegaly, or carotid bruit present. Cardiovascular: RRR, S1 normal, S2 normal, no murmurs, gallops, or rubs. Pulmonary/Chest: Clear to auscultation bilaterally, no wheezes, rales, or rhonchi. Abdominal: Soft, non-tender, non-distended, BS +, no masses, organomegaly, or guarding present. Musculoskeletal: No joint deformities, erythema, or stiffness, ROM full and nontender. Extremities: No lower extremity edema bilaterally,  pulses symmetric and intact bilaterally. No cyanosis or clubbing. Neurological: A&O x3, Strength is normal Skin: Warm, dry and intact. No rashes or erythema. Psychiatric: Normal mood and affect. speech and behavior is normal. Cognition and memory are normal.    Lab Results  Component Value Date   HGBA1C 8.0 (A) 03/02/2022   HGBA1C 6.4 (A) 07/19/2021   HGBA1C 7.0 (A) 03/09/2021    Lab Results  Component Value Date   WBC 4.8 03/02/2022   HGB 12.1 03/02/2022   HCT 36.7 03/02/2022   PLT 341 03/02/2022   GLUCOSE 89 03/02/2022   CHOL 150 03/02/2022   TRIG 37 03/02/2022   HDL 73 03/02/2022   LDLCALC 68 03/02/2022   ALT 22 03/02/2022   AST 20 03/02/2022   NA 140 03/02/2022   K 4.4 03/02/2022   CL 100 03/02/2022   CREATININE 0.82 03/02/2022   BUN 10 03/02/2022   CO2 23 03/02/2022   TSH 2.350 01/03/2021   HGBA1C 8.0 (A) 03/02/2022   MICROALBUR 0.50 12/11/2013     Assessment & Plan:  Abiola was seen today for diabetes and hypertension.  Diagnoses and all orders for this visit:  Need for immunization against influenza -     Flu Vaccine QUAD 8mo+IM (Fluarix,  Fluzone & Alfiuria Quad PF)  Essential hypertension BP goal - < 130/80 Explained that having normal blood pressure is the goal and medications are helping to get to goal and maintain normal blood pressure. DIET: Limit salt intake, read nutrition labels to check salt content, limit fried and high fatty foods  Avoid using multisymptom OTC cold preparations that generally contain sudafed which can rise BP. Consult with pharmacist on best cold relief products to use for persons with HTN EXERCISE Discussed incorporating exercise such as walking - 30 minutes most days of the week and can do in 10 minute intervals    -     NIFEdipine (ADALAT CC) 60 MG 24 hr tablet; Take 1 tablet (60 mg total) by mouth 2 (two) times daily. -     labetalol (NORMODYNE) 100 MG tablet; Take 1 tablet (100 mg total) by mouth 2 (two) times daily.  Type 2 diabetes mellitus with long term insulin A1c increased from 8.0-8.2 in  the last 3 months.  For some reason she was not able to get her Ozempic and made no one aware.  Ozempic recent and she will follow-up with clinical pharmacist in 4 to 6 weeks reevaluation of diabetes.  She will continue to monitor foods that are high in carbohydrates are the following rice, potatoes, breads, sugars, and pastas.  Reduction in the intake (eating) will assist in lowering your blood sugars.  -     POCT glucose (manual entry) -     POCT glycosylated hemoglobin (Hb A1C) -     Microalbumin / creatinine urine ratio -     NIFEdipine (ADALAT CC) 60 MG 24 hr tablet; Take 1 tablet (60 mg total) by mouth 2 (two) times daily. -     insulin glargine (LANTUS SOLOSTAR) 100 UNIT/ML Solostar Pen; Inject 16 UNITS into THE SKIN 2 TIMES DAILY. 16 UNITS WITH breakfast AND 16 UNITS just BEFORE bed -     labetalol (NORMODYNE) 100 MG tablet; Take 1 tablet (100 mg total) by mouth 2 (two) times daily.  Medication refill -     NIFEdipine (ADALAT CC) 60 MG 24 hr tablet; Take 1 tablet (60 mg total) by mouth 2 (two)  times daily. -     labetalol (NORMODYNE) 100 MG tablet; Take 1 tablet (100 mg total) by mouth 2 (two) times daily.  Muscle spasm of back -     naproxen (NAPROSYN) 500 MG tablet; Take 1 tablet (500 mg total) by mouth 2 (two) times daily with a meal.    I am having Glenis I. Romanello maintain her nortriptyline, Lantus SoloStar, cetirizine, NIFEdipine, cyclobenzaprine, Ozempic (0.25 or 0.5 MG/DOSE), and labetalol.  No orders of the defined types were placed in this encounter.    Follow-up:   No follow-ups on file.  The above assessment and management plan was discussed with the patient. The patient verbalized understanding of and has agreed to the management plan. Patient is aware to call the clinic if symptoms fail to improve or worsen. Patient is aware when to return to the clinic for a follow-up visit. Patient educated on when it is appropriate to go to the emergency department.   Juluis Mire, NP-C

## 2022-06-02 LAB — MICROALBUMIN / CREATININE URINE RATIO
Creatinine, Urine: 161.1 mg/dL
Microalb/Creat Ratio: 14 mg/g creat (ref 0–29)
Microalbumin, Urine: 23.1 ug/mL

## 2022-06-02 MED ORDER — OZEMPIC (0.25 OR 0.5 MG/DOSE) 2 MG/3ML ~~LOC~~ SOPN: PEN_INJECTOR | SUBCUTANEOUS | 1 refills | Status: DC

## 2022-07-03 ENCOUNTER — Ambulatory Visit: Payer: Medicaid Other | Admitting: Pharmacist

## 2022-07-09 ENCOUNTER — Other Ambulatory Visit (INDEPENDENT_AMBULATORY_CARE_PROVIDER_SITE_OTHER): Payer: Self-pay | Admitting: Primary Care

## 2022-07-09 DIAGNOSIS — M62830 Muscle spasm of back: Secondary | ICD-10-CM

## 2022-07-09 DIAGNOSIS — Z760 Encounter for issue of repeat prescription: Secondary | ICD-10-CM

## 2022-07-09 NOTE — Telephone Encounter (Signed)
Requested medication (s) are due for refill today - yes  Requested medication (s) are on the active medication list -yes  Future visit scheduled -yes  Last refill: 03/02/22 #90 1RF  Notes to clinic: non delegated Rx  Requested Prescriptions  Pending Prescriptions Disp Refills   cyclobenzaprine (FLEXERIL) 10 MG tablet [Pharmacy Med Name: cyclobenzaprine 10 mg tablet] 90 tablet 1    Sig: TAKE 1 TABLET BY MOUTH 3 TIMES DAILY AS NEEDED FOR MUSCLE SPASMS     Not Delegated - Analgesics:  Muscle Relaxants Failed - 07/09/2022  9:34 AM      Failed - This refill cannot be delegated      Passed - Valid encounter within last 6 months    Recent Outpatient Visits           1 month ago Need for immunization against influenza   [place], Michelle P, NP   1 month ago Bumps on skin   [place], Michelle P, NP   4 months ago Annual physical exam   [place] Kerin Perna, NP   6 months ago Abscess of anal and rectal regions   [place], Michelle P, NP   10 months ago Chronic hypertension   [place], Section, NP       Future Appointments             In 1 month Daisy Blossom, Jarome Matin, [place]   In 1 month [place], Milford Cage, NP [place]               Requested Prescriptions  Pending Prescriptions Disp Refills   cyclobenzaprine (FLEXERIL) 10 MG tablet [Pharmacy Med Name: cyclobenzaprine 10 mg tablet] 90 tablet 1    Sig: TAKE 1 TABLET BY MOUTH 3 TIMES DAILY AS NEEDED FOR MUSCLE SPASMS     Not Delegated - Analgesics:  Muscle Relaxants Failed - 07/09/2022  9:34 AM      Failed - This refill cannot be delegated      Passed - Valid encounter within last 6 months    Recent Outpatient Visits           1 month ago Need for immunization against influenza    [place], [place], NP   1 month ago Bumps on skin   [place], Michelle P, NP   4 months ago Annual physical exam   [place] Kerin Perna, NP   6 months ago Abscess of anal and rectal regions   [place], Michelle P, NP   10 months ago Chronic hypertension   [place], Michelle P, NP       Future Appointments             In 1 month Daisy Blossom, Jarome Matin, [place]   In 1 month [place], Milford Cage, NP [place]

## 2022-07-12 NOTE — Telephone Encounter (Signed)
Will forward to provider  

## 2022-07-28 ENCOUNTER — Other Ambulatory Visit (INDEPENDENT_AMBULATORY_CARE_PROVIDER_SITE_OTHER): Payer: Self-pay | Admitting: Primary Care

## 2022-08-09 ENCOUNTER — Ambulatory Visit: Payer: Medicaid Other | Admitting: Pharmacist

## 2022-08-16 ENCOUNTER — Ambulatory Visit (INDEPENDENT_AMBULATORY_CARE_PROVIDER_SITE_OTHER): Payer: Self-pay | Admitting: *Deleted

## 2022-08-16 NOTE — Telephone Encounter (Signed)
  Chief Complaint: heavy, prolonged cycle Symptoms: heavy, prolonged cycle, some cramping, no birth control- sexually active Frequency: going on second week of bleeding Pertinent Negatives: Patient denies dizziness,lightheadedness Disposition: [] ED /[] Urgent Care (no appt availability in office) / [] Appointment(In office/virtual)/ []  [facility]/ [] Home Care/ [] Refused Recommended Disposition /[] [facility]/ [x]  Follow-up with PCP Additional Notes: No open appointment- office closed for lunch- unable to contat staff at this time- will send message to see if patient can be scheduled. Patient is aware she may be advised UC.   Reason for Disposition  [1] Periods with > 6 soaked pads or tampons per day AND [2] last > 7 days  Answer Assessment - Initial Assessment Questions 1. AMOUNT: "Describe the bleeding that you are having."    - SPOTTING: spotting, or pinkish / brownish mucous discharge; does not fill panty liner or pad    - MILD:  less than 1 pad / hour; less than patient's usual menstrual bleeding   - MODERATE: 1-2 pads / hour; 1 menstrual cup every 6 hours; small-medium blood clots (e.g., pea, grape, small coin)   - SEVERE: soaking 2 or more pads/hour for 2 or more hours; 1 menstrual cup every 2 hours; bleeding not contained by pads or continuous red blood from vagina; large blood clots (e.g., golf ball, large coin)      moderate 2. ONSET: "When did the bleeding begin?" "Is it continuing now?"     Last week 3. MENSTRUAL PERIOD: "When was the last normal menstrual period?" "How is this different than your period?"     Last month- heavier/longer 4. REGULARITY: "How regular are your periods?"     Regular- 28 days 5. ABDOMEN PAIN: "Do you have any pain?" "How bad is the pain?"  (e.g., Scale 1-10; mild, moderate, or severe)   - MILD (1-3): doesn't interfere with normal activities, abdomen soft and not tender to touch    - MODERATE (4-7): interferes with normal  activities or awakens from sleep, abdomen tender to touch    - SEVERE (8-10): excruciating pain, doubled over, unable to do any normal activities      Mild/moderate 6. PREGNANCY: "Is there any chance you are pregnant?" "When was your last menstrual period?"     No- last month 7. BREASTFEEDING: "Are you breastfeeding?"     no 8. HORMONE MEDICINES: "Are you taking any hormone medicines, prescription or over-the-counter?" (e.g., birth control pills, estrogen)     no 9. BLOOD THINNER MEDICINES: "Do you take any blood thinners?" (e.g., Coumadin / warfarin, Pradaxa / dabigatran, aspirin)     no 10. CAUSE: "What do you think is causing the bleeding?" (e.g., recent gyn surgery, recent gyn procedure; known bleeding disorder, cervical cancer, polycystic ovarian disease, fibroids)         no 11. HEMODYNAMIC STATUS: "Are you weak or feeling lightheaded?" If Yes, ask: "Can you stand and walk normally?"        no 12. OTHER SYMPTOMS: "What other symptoms are you having with the bleeding?" (e.g., passed tissue, vaginal discharge, fever, menstrual-type cramps)       Small clots  Protocols used: Vaginal Bleeding - Abnormal-A-AH

## 2022-08-16 NOTE — Telephone Encounter (Signed)
FYI   Pt is schedule to see you 12/20 for issue

## 2022-08-22 ENCOUNTER — Ambulatory Visit (INDEPENDENT_AMBULATORY_CARE_PROVIDER_SITE_OTHER): Payer: Medicaid Other | Admitting: Primary Care

## 2022-08-24 ENCOUNTER — Inpatient Hospital Stay (HOSPITAL_COMMUNITY): Payer: Medicaid Other

## 2022-08-24 ENCOUNTER — Encounter (HOSPITAL_COMMUNITY): Payer: Self-pay

## 2022-08-24 ENCOUNTER — Inpatient Hospital Stay (HOSPITAL_COMMUNITY)
Admission: AD | Admit: 2022-08-24 | Discharge: 2022-08-24 | Disposition: A | Discharge status: Home / Self Care | Payer: Medicaid Other | Attending: Obstetrics and Gynecology | Admitting: Obstetrics and Gynecology

## 2022-08-24 ENCOUNTER — Ambulatory Visit (HOSPITAL_COMMUNITY)
Admission: EM | Admit: 2022-08-24 | Discharge: 2022-08-24 | Disposition: A | Discharge status: Home / Self Care | Payer: Medicaid Other | Attending: Family Medicine | Admitting: Family Medicine

## 2022-08-24 ENCOUNTER — Encounter (HOSPITAL_COMMUNITY): Payer: Self-pay | Admitting: Dentistry

## 2022-08-24 DIAGNOSIS — Z3A11 11 weeks gestation of pregnancy: Secondary | ICD-10-CM | POA: Diagnosis not present

## 2022-08-24 DIAGNOSIS — Z3201 Encounter for pregnancy test, result positive: Secondary | ICD-10-CM

## 2022-08-24 DIAGNOSIS — N939 Abnormal uterine and vaginal bleeding, unspecified: Secondary | ICD-10-CM | POA: Diagnosis not present

## 2022-08-24 DIAGNOSIS — Z3A01 Less than 8 weeks gestation of pregnancy: Secondary | ICD-10-CM | POA: Diagnosis not present

## 2022-08-24 DIAGNOSIS — O3680X0 Pregnancy with inconclusive fetal viability, not applicable or unspecified: Secondary | ICD-10-CM | POA: Diagnosis not present

## 2022-08-24 DIAGNOSIS — O209 Hemorrhage in early pregnancy, unspecified: Secondary | ICD-10-CM

## 2022-08-24 DIAGNOSIS — Z6790 Unspecified blood type, Rh positive: Secondary | ICD-10-CM

## 2022-08-24 DIAGNOSIS — O26891 Other specified pregnancy related conditions, first trimester: Secondary | ICD-10-CM | POA: Diagnosis not present

## 2022-08-24 LAB — CBC
HCT: 33.6 % — ABNORMAL LOW (ref 36.0–46.0)
Hemoglobin: 11.1 g/dL — ABNORMAL LOW (ref 12.0–15.0)
MCH: 29.8 pg (ref 26.0–34.0)
MCHC: 33 g/dL (ref 30.0–36.0)
MCV: 90.3 fL (ref 80.0–100.0)
Platelets: 326 10*3/uL (ref 150–400)
RBC: 3.72 MIL/uL — ABNORMAL LOW (ref 3.87–5.11)
RDW: 12.4 % (ref 11.5–15.5)
WBC: 7 10*3/uL (ref 4.0–10.5)
nRBC: 0 % (ref 0.0–0.2)

## 2022-08-24 LAB — HCG, QUANTITATIVE, PREGNANCY: hCG, Beta Chain, Quant, S: 764 m[IU]/mL — ABNORMAL HIGH (ref <5)

## 2022-08-24 LAB — URINALYSIS, ROUTINE W REFLEX MICROSCOPIC
Bilirubin Urine: NEGATIVE
Glucose, UA: 50 mg/dL — AB
Ketones, ur: NEGATIVE mg/dL
Leukocytes,Ua: NEGATIVE
Nitrite: NEGATIVE
Protein, ur: NEGATIVE mg/dL
Specific Gravity, Urine: 1.015 (ref 1.005–1.030)
pH: 6 (ref 5.0–8.0)

## 2022-08-24 LAB — POC URINE PREG, ED: Preg Test, Ur: POSITIVE — AB

## 2022-08-24 MED ORDER — NIFEDIPINE ER OSMOTIC RELEASE 30 MG PO TB24
30.0000 mg | ORAL_TABLET | Freq: Every day | ORAL | Status: DC
  Administered 2022-08-24: 30 mg via ORAL
  Filled 2022-08-24: qty 1

## 2022-08-24 NOTE — ED Provider Notes (Addendum)
[facility]    CSN: [id number] Arrival date & time: 08/24/22  1336      History   Chief Complaint Chief Complaint  Patient presents with   Menorrhagia    HPI Misty [name] is a 31 y.o. female.   HPI Here for vaginal bleeding that's been going on for 5 weeks.  It has been heavy with clots at times.  No syncope and no dizziness.  No pelvic pain and no fever.  No dysuria.  Currently when it started early November it was about a normal time for her period.    Past Medical History:  Diagnosis Date   Abscess 09/02/2020   Allergy    Anemia 2015   Asthma    as child   Chronic hypertension with superimposed preeclampsia 05/24/2021   Diabetes mellitus without complication ([facility]) 2015   DKA (diabetic ketoacidosis) ([facility]) 09/02/2020   Eczema    Hypertension 09/2013    Patient Active Problem List   Diagnosis Date Noted   Postpartum care following vaginal delivery 08/07/2021   Chronic hypertension 06/17/2021   Marijuana use 05/24/2021   Type 2 diabetes mellitus complicating pregnancy in second trimester, antepartum 01/03/2021    Past Surgical History:  Procedure Laterality Date   Abcess on back      OB History     Gravida  3   Para  1   Term  0   Preterm  1   AB  1   Living  1      SAB  1   IAB  0   Ectopic  0   Multiple  0   Live Births  1            Home Medications    Prior to Admission medications   Medication Sig Start Date End Date Taking? Authorizing Provider  cetirizine (ZYRTEC ALLERGY) 10 MG tablet Take 1 tablet (10 mg total) by mouth daily. 12/24/21   Roxy Horseman, PA-C  insulin glargine (LANTUS SOLOSTAR) 100 UNIT/ML Solostar Pen Inject 16 UNITS into THE SKIN 2 TIMES DAILY. 16 UNITS WITH breakfast AND 16 UNITS just BEFORE bed 06/01/22   Grayce Sessions, NP  labetalol (NORMODYNE) 100 MG tablet Take 1 tablet (100 mg total) by mouth 2 (two) times daily. 06/01/22   Grayce Sessions, NP  NIFEdipine (ADALAT CC) 60  MG 24 hr tablet Take 1 tablet (60 mg total) by mouth 2 (two) times daily. 06/01/22   Grayce Sessions, NP  oseltamivir (TAMIFLU) 75 MG capsule Take 1 capsule (75 mg total) by mouth every 12 (twelve) hours. 08/28/22   Blue, Soijett A, PA-C  Semaglutide,0.25 or 0.5MG /DOS, (OZEMPIC, 0.25 OR 0.5 MG/DOSE,) 2 MG/3ML SOPN Inject 0.25 mg subcutaneously once a week for 4 weeks. Then, increase to 0.5 mg once weekly. 06/02/22   Grayce Sessions, NP    Family History Family History  Problem Relation Age of Onset   Migraines Mother    Hypertension Mother    Heart disease Mother 70       CHF   Diabetes Mother    Healthy Father    Sickle cell anemia Brother    Migraines Maternal Grandmother    Diabetes Maternal Grandmother    Heart disease Maternal Grandmother    Hypertension Maternal Grandfather    Diabetes Maternal Grandfather    Heart disease Maternal Grandfather     Social History Social History   Tobacco Use   Smoking status: Former  Packs/day: 0.50    Years: 2.00    Total pack years: 1.00    Types: Cigarettes    Quit date: 09/08/2013    Years since quitting: 8.9   Smokeless tobacco: Never  Vaping Use   Vaping Use: Never used  Substance Use Topics   Alcohol use: No    Alcohol/week: 0.0 standard drinks of alcohol   Drug use: No     Allergies   Iodides, Morphine and related, Shellfish allergy, and Ultram [tramadol hcl]   Review of Systems Review of Systems   Physical Exam Triage Vital Signs ED Triage Vitals  Enc Vitals Group     BP 08/24/22 1448 (!) 175/91     Pulse Rate 08/24/22 1448 69     Resp 08/24/22 1448 16     Temp 08/24/22 1448 98.3 F (36.8 C)     Temp Source 08/24/22 1448 Oral     SpO2 08/24/22 1448 98 %     Weight --      Height --      Head Circumference --      Peak Flow --      Pain Score 08/24/22 1449 0     Pain Loc --      Pain Edu? --      Excl. in GC? --    No data found.  Updated Vital Signs BP (!) 175/91 (BP Location: Left Arm)    Pulse 69   Temp 98.3 F (36.8 C) (Oral)   Resp 16   LMP 07/20/2022 (Approximate)   SpO2 98%   Breastfeeding No   Visual Acuity Right Eye Distance:   Left Eye Distance:   Bilateral Distance:    Right Eye Near:   Left Eye Near:    Bilateral Near:     Physical Exam Vitals reviewed.  Constitutional:      General: She is not in acute distress.    Appearance: She is not ill-appearing, toxic-appearing or diaphoretic.  HENT:     Mouth/Throat:     Mouth: Mucous membranes are moist.     Comments: Mucous membranes are a little pale Eyes:     Extraocular Movements: Extraocular movements intact.     Pupils: Pupils are equal, round, and reactive to light.  Cardiovascular:     Rate and Rhythm: Normal rate and regular rhythm.     Heart sounds: No murmur heard. Pulmonary:     Effort: Pulmonary effort is normal.     Breath sounds: Normal breath sounds. No stridor. No wheezing, rhonchi or rales.  Abdominal:     Palpations: Abdomen is soft.     Tenderness: There is no abdominal tenderness.  Musculoskeletal:     Cervical back: Neck supple.  Lymphadenopathy:     Cervical: No cervical adenopathy.  Neurological:     General: No focal deficit present.     Mental Status: She is alert and oriented to person, place, and time.  Psychiatric:        Behavior: Behavior normal.      UC Treatments / Results  Labs (all labs ordered are listed, but only abnormal results are displayed) Labs Reviewed  POC URINE PREG, ED - Abnormal; Notable for the following components:      Result Value   Preg Test, Ur POSITIVE (*)    All other components within normal limits    EKG   Radiology No results found.  Procedures Procedures (including critical care time)  Medications Ordered in UC Medications - No  data to display  Initial Impression / Assessment and Plan / UC Course  I have reviewed the triage vital signs and the nursing notes.  Pertinent labs & imaging results that were available  during my care of the patient were reviewed by me and considered in my medical decision making (see chart for details).        Pregnancy test is positive.  I have instructed her to please proceed to the maternal admissions unit for further evaluation   Patient states she does not have anyone to watch her toddler while she goes to the MAU.  Since she is not having any fever, syncope, or abdominal pain, I have asked her to instead please contact either her family medicine office when they are open or her OB/GYN.  If she develops fever, abdominal pain, or other severe symptoms, she is still to proceed to the emergency room on an urgent basis. Final Clinical Impressions(s) / UC Diagnoses   Final diagnoses:  Abnormal uterine bleeding  Positive pregnancy test     Discharge Instructions      Your pregnancy test is positive.  I would like you to proceed to the maternal admissions unit at [facility], so they can evaluate you further.     ED Prescriptions   None    PDMP not reviewed this encounter.   Zenia Resides, MD 08/24/22 1526    Zenia Resides, MD 08/24/22 1535    Zenia Resides, MD 08/30/22 [phone]

## 2022-08-24 NOTE — Telephone Encounter (Signed)
Pt. Calling and states she has an appointment today at 2:50. Instructed her the office closes today at 12:00 for Christmas holiday.

## 2022-08-24 NOTE — Discharge Instructions (Signed)
Your pregnancy test is positive.  I would like you to proceed to the maternal admissions unit at [facility], so they can evaluate you further.

## 2022-08-24 NOTE — MAU Provider Note (Signed)
History     CSN: [id number]  Arrival date and time: 08/24/22 1935   Event Date/Time   First Provider Initiated Contact with Patient 08/24/22 2129      Chief Complaint  Patient presents with   Vaginal Bleeding   HPI Misty [name] is a 31 y.o. G3P011 in early pregnancy who presents to MAU as advised by Urgent Care earlier today. Patient states she presented to Urgent Care for help with prolonged bleeding. There she learned that she was pregnant. She is s/p vaginal birth on 06/22/2021 and never restarted birth control.   Patient states her bleeding is small but persistent for the past five weeks. She occasionally visualizes small clots. She is not saturating pads. She has not experienced dizziness, weakness or syncope.  Patient's PMH is significant for Chronic Hypertension. She reports that she was managed with Procardia XL 30 mg daily and Labetalol 100 mg BID during her 2022 pregnancy. She states her PCP told her to continue these medications once she was cleared by OB. She has not taken them today.  OB History     Gravida  3   Para  1   Term  0   Preterm  1   AB  1   Living  1      SAB  1   IAB  0   Ectopic  0   Multiple  0   Live Births  1           Past Medical History:  Diagnosis Date   Abscess 09/02/2020   Allergy    Anemia 2015   Asthma    as child   Chronic hypertension with superimposed preeclampsia 05/24/2021   Diabetes mellitus without complication (HCC) 2015   DKA (diabetic ketoacidosis) (HCC) 09/02/2020   Eczema    Hypertension 09/2013    Past Surgical History:  Procedure Laterality Date   Abcess on back      Family History  Problem Relation Age of Onset   Migraines Mother    Hypertension Mother    Heart disease Mother 30       CHF   Diabetes Mother    Healthy Father    Sickle cell anemia Brother    Migraines Maternal Grandmother    Diabetes Maternal Grandmother    Heart disease Maternal Grandmother    Hypertension  Maternal Grandfather    Diabetes Maternal Grandfather    Heart disease Maternal Grandfather     Social History   Tobacco Use   Smoking status: Former    Packs/day: 0.50    Years: 2.00    Total pack years: 1.00    Types: Cigarettes    Quit date: 09/08/2013    Years since quitting: 8.9   Smokeless tobacco: Never  Vaping Use   Vaping Use: Never used  Substance Use Topics   Alcohol use: No    Alcohol/week: 0.0 standard drinks of alcohol   Drug use: No    Allergies:  Allergies  Allergen Reactions   Iodides Anaphylaxis and Itching   Morphine And Related Anaphylaxis and Itching   Shellfish Allergy Anaphylaxis and Itching    Pt reports "watery eyes"   Ultram [Tramadol Hcl] Hives    Hives and swollen lips     Medications Prior to Admission  Medication Sig Dispense Refill Last Dose   cetirizine (ZYRTEC ALLERGY) 10 MG tablet Take 1 tablet (10 mg total) by mouth daily. 30 tablet 0    cyclobenzaprine (FLEXERIL) 10 MG tablet  TAKE 1 TABLET BY MOUTH 3 TIMES DAILY AS NEEDED FOR MUSCLE SPASMS 90 tablet 1    insulin glargine (LANTUS SOLOSTAR) 100 UNIT/ML Solostar Pen Inject 16 UNITS into THE SKIN 2 TIMES DAILY. 16 UNITS WITH breakfast AND 16 UNITS just BEFORE bed 15 mL 11    labetalol (NORMODYNE) 100 MG tablet Take 1 tablet (100 mg total) by mouth 2 (two) times daily. 180 tablet 1    naproxen (NAPROSYN) 500 MG tablet TAKE 1 TABLET BY MOUTH 2 TIMES DAILY WITH A meal 120 tablet 1    NIFEdipine (ADALAT CC) 60 MG 24 hr tablet Take 1 tablet (60 mg total) by mouth 2 (two) times daily. 180 tablet 1    Semaglutide,0.25 or 0.5MG /DOS, (OZEMPIC, 0.25 OR 0.5 MG/DOSE,) 2 MG/3ML SOPN Inject 0.25 mg subcutaneously once a week for 4 weeks. Then, increase to 0.5 mg once weekly. 3 mL 1     Review of Systems  Genitourinary:  Positive for vaginal bleeding.  All other systems reviewed and are negative.  Physical Exam   Blood pressure (!) 177/93, pulse 68, temperature 97.9 F (36.6 C), temperature source  Oral, resp. rate 18, weight 106.4 kg, last menstrual period 07/20/2022, SpO2 100 %, not currently breastfeeding.  Physical Exam Vitals and nursing note reviewed. Exam conducted with a chaperone present.  Constitutional:      Appearance: Normal appearance. She is obese. She is not ill-appearing.  Cardiovascular:     Rate and Rhythm: Normal rate and regular rhythm.     Pulses: Normal pulses.     Heart sounds: Normal heart sounds.  Pulmonary:     Effort: Pulmonary effort is normal.     Breath sounds: Normal breath sounds.  Abdominal:     General: Abdomen is flat.     Tenderness: There is no abdominal tenderness.  Skin:    Capillary Refill: Capillary refill takes less than 2 seconds.  Neurological:     Mental Status: She is alert and oriented to person, place, and time.  Psychiatric:        Mood and Affect: Mood normal.        Behavior: Behavior normal.        Thought Content: Thought content normal.        Judgment: Judgment normal.     MAU Course  Procedures  MDM  --Will give patient Procardia XL 30 mg for severe range BP on arrival to MAU --Discussed importance of trending quant hCG to assess pregnancy viability, assess for miscarriage  Orders Placed This Encounter  Procedures   US OB LESS THAN 14 WEEKS WITH OB TRANSVAGINAL   Urinalysis, Routine w reflex microscopic Urine, Clean Catch   CBC   hCG, quantitative, pregnancy   Discharge patient   Patient Vitals for the past 24 hrs:  BP Temp Temp src Pulse Resp SpO2 Weight  08/24/22 2140 (!) 157/92 -- -- 72 18 99 % --  08/24/22 2007 (!) 177/93 97.9 F (36.6 C) Oral 68 18 100 % 106.4 kg   Results for orders placed or performed during the hospital encounter of 08/24/22 (from the past 24 hour(s))  CBC     Status: Abnormal   Collection Time: 08/24/22  8:24 PM  Result Value Ref Range   WBC 7.0 4.0 - 10.5 K/uL   RBC 3.72 (L) 3.87 - 5.11 MIL/uL   Hemoglobin 11.1 (L) 12.0 - 15.0 g/dL   HCT 14.4 (L) 31.5 - 40.0 %   MCV 90.3  80.0 - 100.0 fL  MCH 29.8 26.0 - 34.0 pg   MCHC 33.0 30.0 - 36.0 g/dL   RDW 93.7 16.9 - 67.8 %   Platelets 326 150 - 400 K/uL   nRBC 0.0 0.0 - 0.2 %  hCG, quantitative, pregnancy     Status: Abnormal   Collection Time: 08/24/22  8:24 PM  Result Value Ref Range   hCG, Beta Chain, Quant, S 764 (H) <5 mIU/mL   US OB LESS THAN 14 WEEKS WITH OB TRANSVAGINAL  Result Date: 08/24/2022 CLINICAL DATA:  Pregnancy and vaginal bleeding. EXAM: OBSTETRIC <14 WK [place] AND TRANSVAGINAL OB US TECHNIQUE: Both transabdominal and transvaginal ultrasound examinations were performed for complete evaluation of the gestation as well as the maternal uterus, adnexal regions, and pelvic cul-de-sac. Transvaginal technique was performed to assess early pregnancy. COMPARISON:  None Available. FINDINGS: The uterus is anteverted appears unremarkable. The endometrium measures approximately 11 mm in thickness. A sac-like structure noted within the endometrium. No yolk sac or fetal pole identified within this structure. This may represent an early gestational sac, or a blighted ovum. A pseudogestation of an ectopic pregnancy is not excluded. If this cystic structure represent a true gestational sac, the estimated gestational age based on mean sac diameter of 3 mm is 5 weeks, 0 days. No perigestational hemorrhage. The right ovary is unremarkable. The left ovary is not visualized and obscured by bowel gas. IMPRESSION: Sac-like structure within the endometrium may represent an early gestational sac. Correlation with HCG levels and follow-up with ultrasound in 7-11 days, or earlier if clinically indicated, recommended. Electronically Signed   By: Elgie Collard M.D.   On: 08/24/2022 21:20     Assessment and Plan  --31 y.o. G3P0111 with pregnancy of unknown location --Patient report bleeding x 5 weeks --Quant hCG 764 --Hgb 11.1 --Blood type O POS, Rhogam not indicated --Discharge home in stable condition with bleeding  precautions  F/U: --Patient to return to MAU 08/26/2022 after 2030 hours for repeat stat Quant hCG --Given it will be Christmas Eve, I told patient if her symptoms/acuity are unchanged she can be called with results instead of waiting in MAU, pending approval from MAU Provider at that time  Calvert Cantor, MSA, MSN, CNM 08/24/2022, 10:01 PM

## 2022-08-24 NOTE — ED Triage Notes (Signed)
Pt states she has had her period for the past 5 weeks. States it is heavy with clots.

## 2022-08-24 NOTE — MAU Note (Signed)
..  Misty [name] is a 31 y.o. at Unknown here in MAU reporting: has been having vaginal bleeding for 5 weeks straight, bright red blood with clots and she changes tampon/pad every hour. Denies pain.  Went to urgent care today LMP: unknown  Pain score: 0/10 Vitals:   08/24/22 2007  BP: (!) 177/93  Pulse: 68  Resp: 18  Temp: 97.9 F (36.6 C)  SpO2: 100%     Takes procardia and labetalol for high blood pressure.  FHT:na Lab orders placed from triage:  ua 2009 CNM at bedside in triage

## 2022-08-26 ENCOUNTER — Telehealth: Payer: Self-pay | Admitting: Obstetrics and Gynecology

## 2022-08-26 ENCOUNTER — Inpatient Hospital Stay (HOSPITAL_COMMUNITY): Admit: 2022-08-26 | Payer: Medicaid Other

## 2022-08-26 NOTE — Telephone Encounter (Signed)
Patient reports she is scheduled to come to MAU for stat HCG at 2030 tonight. She reports she has a youngest child with her without anyone to babysit. She is requesting to come tomorrow (08/27/2022) for her stat HCG to be done.  Advised that HCG results at 72 hours may make evaluation of the results a little different, but that we cannot make her come in. Advised to come as early as she can tomorrow for a stat HCG level to be drawn. As stated by previous MAU provider, may not have to stay after blood is drawn and can be called by MAU provider with results. Patient verbalized an understanding of the plan of care and agrees.   Raelyn Mora, CNM  08/26/2022 7:38 PM

## 2022-08-28 ENCOUNTER — Other Ambulatory Visit (HOSPITAL_COMMUNITY): Payer: Self-pay

## 2022-08-28 ENCOUNTER — Encounter (HOSPITAL_COMMUNITY): Payer: Self-pay | Admitting: Emergency Medicine

## 2022-08-28 ENCOUNTER — Emergency Department (HOSPITAL_COMMUNITY)
Admission: EM | Admit: 2022-08-28 | Discharge: 2022-08-28 | Disposition: A | Discharge status: Home / Self Care | Payer: Medicaid Other | Attending: Emergency Medicine | Admitting: Emergency Medicine

## 2022-08-28 DIAGNOSIS — J101 Influenza due to other identified influenza virus with other respiratory manifestations: Secondary | ICD-10-CM | POA: Insufficient documentation

## 2022-08-28 DIAGNOSIS — B974 Respiratory syncytial virus as the cause of diseases classified elsewhere: Secondary | ICD-10-CM | POA: Diagnosis not present

## 2022-08-28 DIAGNOSIS — Z1152 Encounter for screening for COVID-19: Secondary | ICD-10-CM | POA: Insufficient documentation

## 2022-08-28 DIAGNOSIS — O26899 Other specified pregnancy related conditions, unspecified trimester: Secondary | ICD-10-CM | POA: Diagnosis present

## 2022-08-28 DIAGNOSIS — O99519 Diseases of the respiratory system complicating pregnancy, unspecified trimester: Secondary | ICD-10-CM | POA: Diagnosis not present

## 2022-08-28 DIAGNOSIS — Z3A01 Less than 8 weeks gestation of pregnancy: Secondary | ICD-10-CM | POA: Insufficient documentation

## 2022-08-28 DIAGNOSIS — O3680X0 Pregnancy with inconclusive fetal viability, not applicable or unspecified: Secondary | ICD-10-CM

## 2022-08-28 DIAGNOSIS — Z794 Long term (current) use of insulin: Secondary | ICD-10-CM | POA: Insufficient documentation

## 2022-08-28 DIAGNOSIS — B338 Other specified viral diseases: Secondary | ICD-10-CM

## 2022-08-28 LAB — RESP PANEL BY RT-PCR (RSV, FLU A&B, COVID)  RVPGX2
Influenza A by PCR: POSITIVE — AB
Influenza B by PCR: NEGATIVE
Resp Syncytial Virus by PCR: POSITIVE — AB
SARS Coronavirus 2 by RT PCR: NEGATIVE

## 2022-08-28 LAB — HCG, QUANTITATIVE, PREGNANCY: hCG, Beta Chain, Quant, S: 734 m[IU]/mL — ABNORMAL HIGH (ref <5)

## 2022-08-28 MED ORDER — ACETAMINOPHEN 325 MG PO TABS
650.0000 mg | ORAL_TABLET | Freq: Once | ORAL | Status: AC
  Administered 2022-08-28: 650 mg via ORAL
  Filled 2022-08-28: qty 2

## 2022-08-28 MED ORDER — OSELTAMIVIR PHOSPHATE 75 MG PO CAPS
75.0000 mg | ORAL_CAPSULE | Freq: Two times a day (BID) | ORAL | 0 refills | Status: DC
  Filled 2022-08-28: qty 10, 5d supply, fill #0

## 2022-08-28 NOTE — ED Provider Notes (Signed)
[facility] EMERGENCY DEPARTMENT Provider Note   CSN: [id number] Arrival date & time: 08/28/22  1045     History  Chief Complaint  Patient presents with   Cough   Headache    Misty [name] is a 31 y.o. female who presents emergency department with concerns for headache, rhinorrhea, nasal congestion, cough.  Sick contacts at home with similar symptoms.  Tried over-the-counter TheraFlu for her symptoms.  Denies trouble breathing.  Patient was unable to return to the MAU due to restrictions at this time for no children under the age of 61 and patient not having anyone to watch her children.   Per pt chart review: Pt was evaluated at MAU on 08/24/2022 due to possible pregnancy and vaginal bleeding.  At that time had ultrasound done as well as a hCG done.  It was recommended for patient to have a repeat ultrasound in 7-11 days. It was recommended for patient to return to MAU on 08/26/2022 for repeat hCG.  The history is provided by the patient. No language interpreter was used.       Home Medications Prior to Admission medications   Medication Sig Start Date End Date Taking? Authorizing Provider  oseltamivir (TAMIFLU) 75 MG capsule Take 1 capsule (75 mg total) by mouth every 12 (twelve) hours. 08/28/22  Yes Jenasis Straley A, PA-C  cetirizine (ZYRTEC ALLERGY) 10 MG tablet Take 1 tablet (10 mg total) by mouth daily. 12/24/21   Roxy Horseman, PA-C  insulin glargine (LANTUS SOLOSTAR) 100 UNIT/ML Solostar Pen Inject 16 UNITS into THE SKIN 2 TIMES DAILY. 16 UNITS WITH breakfast AND 16 UNITS just BEFORE bed 06/01/22   Grayce Sessions, NP  labetalol (NORMODYNE) 100 MG tablet Take 1 tablet (100 mg total) by mouth 2 (two) times daily. 06/01/22   Grayce Sessions, NP  NIFEdipine (ADALAT CC) 60 MG 24 hr tablet Take 1 tablet (60 mg total) by mouth 2 (two) times daily. 06/01/22   Grayce Sessions, NP  Semaglutide,0.25 or 0.5MG /DOS, (OZEMPIC, 0.25 OR 0.5 MG/DOSE,) 2 MG/3ML  SOPN Inject 0.25 mg subcutaneously once a week for 4 weeks. Then, increase to 0.5 mg once weekly. 06/02/22   Grayce Sessions, NP      Allergies    Iodides, Morphine and related, Shellfish allergy, and Ultram [tramadol hcl]    Review of Systems   Review of Systems  Respiratory:  Positive for cough.   All other systems reviewed and are negative.   Physical Exam Updated Vital Signs BP (!) 177/105 (BP Location: Right Wrist)   Pulse (!) 114   Temp 100.3 F (37.9 C) (Oral)   Resp 19   Ht 5' 3.5" (1.613 m)   Wt 106 kg   LMP 07/20/2022 (Approximate)   SpO2 96%   BMI 40.75 kg/m  Physical Exam Vitals and nursing note reviewed.  Constitutional:      General: She is not in acute distress.    Appearance: She is not diaphoretic.  HENT:     Head: Normocephalic and atraumatic.     Mouth/Throat:     Pharynx: No oropharyngeal exudate.  Eyes:     General: No scleral icterus.    Conjunctiva/sclera: Conjunctivae normal.  Cardiovascular:     Rate and Rhythm: Normal rate and regular rhythm.     Pulses: Normal pulses.     Heart sounds: Normal heart sounds.  Pulmonary:     Effort: Pulmonary effort is normal. No respiratory distress.     Breath  sounds: Normal breath sounds. No wheezing.  Abdominal:     General: Bowel sounds are normal.     Palpations: Abdomen is soft. There is no mass.     Tenderness: There is no abdominal tenderness. There is no guarding or rebound.  Musculoskeletal:        General: Normal range of motion.     Cervical back: Normal range of motion and neck supple.  Skin:    General: Skin is warm and dry.  Neurological:     Mental Status: She is alert.  Psychiatric:        Behavior: Behavior normal.     ED Results / Procedures / Treatments   Labs (all labs ordered are listed, but only abnormal results are displayed) Labs Reviewed  RESP PANEL BY RT-PCR (RSV, FLU A&B, COVID)  RVPGX2 - Abnormal; Notable for the following components:      Result Value    Influenza A by PCR POSITIVE (*)    Resp Syncytial Virus by PCR POSITIVE (*)    All other components within normal limits  HCG, QUANTITATIVE, PREGNANCY - Abnormal; Notable for the following components:   hCG, Beta Chain, Quant, S 734 (*)    All other components within normal limits    EKG None  Radiology No results found.  Procedures Procedures    Medications Ordered in ED Medications  acetaminophen (TYLENOL) tablet 650 mg (650 mg Oral Given 08/28/22 1116)    ED Course/ Medical Decision Making/ A&P Clinical Course as of 08/28/22 1450  Tue Aug 28, 2022  1101 Consult with MAU APP, Herbert Seta who will review the patients chart and hcg and will provide recommendations following hcg. [SB]  1216 Pt re-evaluated and noted that her headache improved slightly with treatment regimen in the ED.  [SB]  1251 Influenza A By PCR(!): POSITIVE [SB]  1252 Respiratory Syncytial Virus by PCR(!): POSITIVE [SB]  1334 Notified patient regarding flu and RSV swab.  Also notified patient regarding ECG lab.  Answered all available questions. [SB]  1357 HCG, Beta Chain, Quant, S(!): 734 [SB]  1357 Discussed with patient results of the hcg lab. Also discussed with patient recommendations as per MAU APP, Heather. Answered all available questions. Pt appears safe for discharge. [SB]  1357 Discussed with Heather, MAU APP recommends 1 week follow up in the office  [SB]    Clinical Course User Index [SB] Treasa Bradshaw A, PA-C                           Medical Decision Making Amount and/or Complexity of Data Reviewed Labs: ordered. Decision-making details documented in ED Course.  Risk OTC drugs. Prescription drug management.   Pt presents with concerns for headache, rhinorrhea, nasal congestion onset 2 days. Pt slightly tachycardic with elevated temp at 100.3. On exam, pt with uvula midline without posterior pharyngeal erythema or tonsillar exudate noted. Able to speak in clear complete sentences.  Tolerating oral secretions well. No acute cardiovascular, respiratory exam findings. Differential diagnosis includes COVID, flu, RSV, viral URI.  Additional history obtained:  External records from outside source obtained and reviewed including: Pt was evaluated at MAU on 08/24/2022 due to possible pregnancy and vaginal bleeding.  At that time had ultrasound done as well as a hCG done.  It was recommended for patient to have a repeat ultrasound in 7-11 days. It was recommended for patient to return to MAU on 08/26/2022 for repeat hCG.  Labs:  I  ordered, and personally interpreted labs.  The pertinent results include:   COVID negative. Flu positive for Flu A RSV positive hCG quant at 734, slightly down trended from 764 on 08/24/22  Medications:  I ordered medication including Tylenol for symptom management Reevaluation of the patient after these medicines and interventions, I reevaluated the patient and found that they have improved I have reviewed the patients home medicines and have made adjustments as needed  Consultations: I requested consultation with the MAU APP, Heather, and discussed lab and imaging findings as well as pertinent plan - they recommend: follow up in office in 1 week for repeat hcg   Disposition: Presentation suspicious for Flu A and RSV. Doubt COVID at this time. Pt without vaginal bleeding (resolved for the past 2 days).  Doubt concerns for ectopic pregnancy, no abdominal pain at this time, ultrasound that was completed on 08/24/2022 with gestational sac as per MAU providers.  After consideration of the diagnostic results and the patients response to treatment, I feel that the patient would benefit from Discharge home. Instructed patient to follow up with OBGYN at [facility] in 1 week as scheduled. Tamiflu Rx sent to pt pharmacy. Supportive care measures and strict return precautions discussed with patient at bedside. Pt acknowledges and verbalizes  understanding. Pt appears safe for discharge. Follow up as indicated in discharge paperwork.    This chart was dictated using voice recognition software, Dragon. Despite the best efforts of this provider to proofread and correct errors, errors may still occur which can change documentation meaning.  Final Clinical Impression(s) / ED Diagnoses Final diagnoses:  Influenza A  RSV (respiratory syncytial virus infection)  Pregnancy of unknown anatomic location    Rx / DC Orders ED Discharge Orders          Ordered    oseltamivir (TAMIFLU) 75 MG capsule  Every 12 hours        08/28/22 1433              Afsheen Antony A, PA-C 08/28/22 1651    Lonell Grandchild, MD 08/29/22 [phone]

## 2022-08-28 NOTE — ED Triage Notes (Signed)
Pt endorses headache and cough for 2 days.

## 2022-08-28 NOTE — Discharge Instructions (Addendum)
It was a pleasure taking care of you today!   Your labs showed concern for Flu A and RSV. You will be sent a prescription for Tamiflu, take as directed. Ensure to maintain fluid intake with water, tea, broth, soup, Pedialyte, Gatorade.   Your hcg was also noted to be at 734 today. You will receive a call from the OBGYN office to set up a follow up appointment in 1 week for repeat labs and ultrasound at that time. Start taking over the counter prenatal vitamins.   You may follow up with your primary care provider regarding todays ED visit. Return to the ED if you are experiencing increasing/worsening symptoms.

## 2022-08-28 NOTE — ED Provider Triage Note (Signed)
Emergency Medicine Provider Triage Evaluation Note  Misty [name] , a 31 y.o. female  was evaluated in triage.  Pt complains of concerns for headache, rhinorrhea, nasal congestion, cough.  Sick contacts at home with similar symptoms.  Tried over-the-counter TheraFlu for her symptoms.  Denies trouble breathing.  Patient was unable to return to the MAU due to restrictions at this time for no children under the age of 64 and patient not having anyone to watch her children.  Per pt chart review: Pt was evaluated at MAU on 08/24/2022 due to possible pregnancy and vaginal bleeding.  At that time had ultrasound done as well as a CT done.  It was recommended for patient to return to MAU on 08/26/2022 for repeat hCG.  Review of Systems  Positive:  Negative:   Physical Exam  BP (!) 177/105 (BP Location: Right Wrist)   Pulse (!) 114   Temp 100.3 F (37.9 C) (Oral)   Resp 19   Ht 5' 3.5" (1.613 m)   Wt 106 kg   LMP 07/20/2022 (Approximate)   SpO2 96%   BMI 40.75 kg/m  Gen:   Awake, no distress   Resp:  Normal effort  MSK:   Moves extremities without difficulty  Other:  Uvula midline without swelling. No posterior pharyngeal erythema or tonsillar exudate noted.  Able to speak in clear complete sentences.  Tolerating oral secretions well.  Medical Decision Making  Medically screening exam initiated at 11:03 AM.  Appropriate orders placed.  Misty [name] was informed that the remainder of the evaluation will be completed by another provider, this initial triage assessment does not replace that evaluation, and the importance of remaining in the ED until their evaluation is complete.  11:01 AM - consult with MAU APP, Heather who will review the patients chart and hcg and will provide recommendations following hcg.   Keaira Whitehurst A, PA-C 08/28/22 1103

## 2022-08-29 ENCOUNTER — Other Ambulatory Visit: Payer: Self-pay

## 2022-08-29 DIAGNOSIS — O3680X0 Pregnancy with inconclusive fetal viability, not applicable or unspecified: Secondary | ICD-10-CM

## 2022-08-31 ENCOUNTER — Encounter (INDEPENDENT_AMBULATORY_CARE_PROVIDER_SITE_OTHER): Payer: Self-pay

## 2022-08-31 ENCOUNTER — Ambulatory Visit (INDEPENDENT_AMBULATORY_CARE_PROVIDER_SITE_OTHER): Payer: Medicaid Other | Admitting: Primary Care

## 2022-09-04 ENCOUNTER — Other Ambulatory Visit: Payer: Medicaid Other

## 2022-09-04 DIAGNOSIS — O3680X0 Pregnancy with inconclusive fetal viability, not applicable or unspecified: Secondary | ICD-10-CM

## 2022-09-05 ENCOUNTER — Telehealth: Payer: Self-pay

## 2022-09-05 DIAGNOSIS — O039 Complete or unspecified spontaneous abortion without complication: Secondary | ICD-10-CM

## 2022-09-05 LAB — BETA HCG QUANT (REF LAB): hCG Quant: 186 m[IU]/mL

## 2022-09-05 NOTE — Telephone Encounter (Signed)
-----   Message from Aletha Halim, MD sent at 09/05/2022 11:20 AM EST ----- Can you let her know that her beta hcg is going down consistent with a miscarriage. Please have her come back for a non stat beta hcg in 7 days. thanks

## 2022-09-05 NOTE — Telephone Encounter (Signed)
Call placed to pt. Spoke with pt. Pt given results per Dr Ilda Basset. Pt verbalized understanding. Pt scheduled for repeat non stat beta in 1 week on 1/9 at 10am. Pt agreeable to plan of care.  Colletta Maryland, RNC

## 2022-09-11 ENCOUNTER — Other Ambulatory Visit: Payer: Medicaid Other

## 2022-10-23 ENCOUNTER — Telehealth (INDEPENDENT_AMBULATORY_CARE_PROVIDER_SITE_OTHER): Payer: Self-pay | Admitting: Primary Care

## 2022-10-23 MED ORDER — PEN NEEDLES 32G X 4 MM MISC: 2 refills | Status: DC

## 2022-10-23 NOTE — Telephone Encounter (Signed)
Misty [name] could you take care of this

## 2022-10-23 NOTE — Telephone Encounter (Signed)
Copied from [place] [id number]. Topic: General - Other >> Oct 23, 2022 11:33 AM Everette C wrote: Reason for CRM: Medication Refill - Medication: pen needles - length, gauge and frequency are needed to be noted for the prescription   Has the patient contacted their pharmacy? Yes.   (Agent: If no, request that the patient contact the pharmacy for the refill. If patient does not wish to contact the pharmacy document the reason why and proceed with request.) (Agent: If yes, when and what did the pharmacy advise?)  Preferred Pharmacy (with phone number or street name): [facility] - [place], [place] - [address] [place] [address] Phone: [phone] Fax: [phone] Hours: Not open 24 hours   Has the patient been seen for an appointment in the last year OR does the patient have an upcoming appointment? Yes.    Agent: Please be advised that RX refills may take up to 3 business days. We ask that you follow-up with your pharmacy.

## 2022-10-23 NOTE — Telephone Encounter (Signed)
Yes ma'am, done. 

## 2022-10-24 ENCOUNTER — Ambulatory Visit: Payer: Medicaid Other

## 2022-10-24 ENCOUNTER — Ambulatory Visit (INDEPENDENT_AMBULATORY_CARE_PROVIDER_SITE_OTHER): Payer: Medicaid Other

## 2022-10-24 DIAGNOSIS — Z111 Encounter for screening for respiratory tuberculosis: Secondary | ICD-10-CM | POA: Diagnosis not present

## 2022-10-24 NOTE — Progress Notes (Unsigned)
Tuberculin skin test applied to right ventral forearm.  Patient has been schedule an appt for nurse visit at [facility] for ppd reading for 3pm for 10/26/22.   Verdell Carmine, RMA

## 2022-10-26 ENCOUNTER — Ambulatory Visit: Payer: Medicaid Other

## 2022-10-31 ENCOUNTER — Other Ambulatory Visit (HOSPITAL_COMMUNITY): Payer: Self-pay

## 2022-11-05 ENCOUNTER — Ambulatory Visit (INDEPENDENT_AMBULATORY_CARE_PROVIDER_SITE_OTHER): Payer: Medicaid Other | Admitting: Primary Care

## 2022-11-07 ENCOUNTER — Encounter (INDEPENDENT_AMBULATORY_CARE_PROVIDER_SITE_OTHER): Payer: Self-pay | Admitting: Primary Care

## 2022-11-07 ENCOUNTER — Ambulatory Visit (INDEPENDENT_AMBULATORY_CARE_PROVIDER_SITE_OTHER): Payer: Medicaid Other | Admitting: Primary Care

## 2022-11-07 ENCOUNTER — Telehealth (INDEPENDENT_AMBULATORY_CARE_PROVIDER_SITE_OTHER): Payer: Self-pay | Admitting: Primary Care

## 2022-11-07 VITALS — BP 124/82 | HR 66 | Resp 16 | Ht 63.0 in | Wt 226.6 lb

## 2022-11-07 DIAGNOSIS — Z30013 Encounter for initial prescription of injectable contraceptive: Secondary | ICD-10-CM

## 2022-11-07 DIAGNOSIS — E119 Type 2 diabetes mellitus without complications: Secondary | ICD-10-CM | POA: Diagnosis not present

## 2022-11-07 DIAGNOSIS — I10 Essential (primary) hypertension: Secondary | ICD-10-CM

## 2022-11-07 DIAGNOSIS — O24112 Pre-existing diabetes mellitus, type 2, in pregnancy, second trimester: Secondary | ICD-10-CM

## 2022-11-07 DIAGNOSIS — F17210 Nicotine dependence, cigarettes, uncomplicated: Secondary | ICD-10-CM | POA: Diagnosis not present

## 2022-11-07 DIAGNOSIS — Z111 Encounter for screening for respiratory tuberculosis: Secondary | ICD-10-CM | POA: Diagnosis not present

## 2022-11-07 LAB — POCT GLYCOSYLATED HEMOGLOBIN (HGB A1C): HbA1c, POC (controlled diabetic range): 9.6 % — AB (ref 0.0–7.0)

## 2022-11-07 MED ORDER — SITAGLIPTIN PHOSPHATE 25 MG PO TABS: 25.0000 mg | ORAL_TABLET | Freq: Every day | ORAL | 1 refills | Status: DC

## 2022-11-07 MED ORDER — LANTUS SOLOSTAR 100 UNIT/ML ~~LOC~~ SOPN: PEN_INJECTOR | SUBCUTANEOUS | 11 refills | Status: DC

## 2022-11-07 MED ORDER — NAPROXEN 500 MG PO TABS: 500.0000 mg | ORAL_TABLET | Freq: Two times a day (BID) | ORAL | 1 refills | Status: DC

## 2022-11-07 MED ORDER — LANTUS SOLOSTAR 100 UNIT/ML ~~LOC~~ SOPN: PEN_INJECTOR | SUBCUTANEOUS | 3 refills | Status: DC

## 2022-11-07 NOTE — Progress Notes (Signed)
Subjective:  Patient ID: De Nurse, female    DOB: 08-09-1991  Age: 32 y.o. MRN: [id number]  CC: Diabetes   HPI [name] presents forFollow-up of diabetes. Patient does not check blood sugar at home  Compliant with meds - Yes Stop ozempic due to GI upset-  Checking CBGs? No  Fasting avg -   Postprandial average -  Exercising regularly? - No Watching carbohydrate intake? - Yes Neuropathy ? - No Hypoglycemic events - No  - Recovers with :   Pertinent ROS:  Polyuria - No Polydipsia - No Vision problems - No Management of HTN- Patient has No headache, No chest pain, No abdominal pain - No Nausea, No new weakness tingling or numbness, No Cough - shortness of breath  Medications as noted below. Taking them regularly without complication/adverse reaction being reported today.   History Misty [name] has a past medical history of Abscess (09/02/2020), Allergy, Anemia (2015), Asthma, Chronic hypertension with superimposed preeclampsia (05/24/2021), Diabetes mellitus without complication ([place]) (123456), DKA (diabetic ketoacidosis) ([place]) (09/02/2020), Eczema, and Hypertension (09/2013).   She has a past surgical history that includes Abcess on back.   Her family history includes Diabetes in her maternal grandfather, maternal grandmother, and mother; Healthy in her father; Heart disease in her maternal grandfather and maternal grandmother; Heart disease (age of onset: 51) in her mother; Hypertension in her maternal grandfather and mother; Migraines in her maternal grandmother and mother; Sickle cell anemia in her brother.She reports that she quit smoking about 9 years ago. Her smoking use included cigarettes. She has a 1.00 pack-year smoking history. She has never used smokeless tobacco. She reports that she does not drink alcohol and does not use drugs.  Current Outpatient Medications on File Prior to Visit  Medication Sig Dispense Refill   cetirizine (ZYRTEC ALLERGY) 10 MG tablet  Take 1 tablet (10 mg total) by mouth daily. 30 tablet 0   Insulin Pen Needle (PEN NEEDLES) 32G X 4 MM MISC Use to inject insulin twice daily. Max of 2 pen needles daily. 100 each 2   labetalol (NORMODYNE) 100 MG tablet Take 1 tablet (100 mg total) by mouth 2 (two) times daily. 180 tablet 1   NIFEdipine (ADALAT CC) 60 MG 24 hr tablet Take 1 tablet (60 mg total) by mouth 2 (two) times daily. 180 tablet 1   oseltamivir (TAMIFLU) 75 MG capsule Take 1 capsule (75 mg total) by mouth every 12 (twelve) hours. 10 capsule 0   No current facility-administered medications on file prior to visit.    ROS Comprehensive ROS Pertinent positive and negative noted in HPI    Objective:  Blood Pressure 124/82   Pulse 66   Respiration 16   Height '5\' 3"'$  (1.6 m)   Weight 226 lb 9.6 oz (102.8 kg)   Last Menstrual Period 07/20/2022 (Approximate)   Oxygen Saturation 97%   Body Mass Index 40.14 kg/m   BP Readings from Last 3 Encounters:  11/07/22 124/82  08/28/22 (Abnormal) 177/105  08/24/22 (Abnormal) 157/92    Wt Readings from Last 3 Encounters:  11/07/22 226 lb 9.6 oz (102.8 kg)  08/28/22 233 lb 11 oz (106 kg)  08/24/22 234 lb 9.6 oz (106.4 kg)    Physical Exam General: No apparent distress. Eyes: Extraocular eye movements intact, pupils equal and round. Neck: Supple, trachea midline. Thyroid: No enlargement, mobile without fixation, no tenderness. Cardiovascular: Regular rhythm and rate, no murmur, normal radial pulses. Respiratory: Normal respiratory effort, clear to auscultation. Gastrointestinal:  Normal pitch active bowel sounds, nontender abdomen without distention or appreciable hepatomegaly. Musculoskeletal: Normal muscle tone, no tenderness on palpation of tibia, no excessive thoracic kyphosis. Skin: Appropriate warmth, no visible rash. Mental status: Alert, conversant, speech clear, thought logical, appropriate mood and affect, no hallucinations or delusions  evident. Hematologic/lymphatic: No cervical adenopathy, no visible ecchymoses.  Lab Results  Component Value Date   HGBA1C 9.6 (A) 11/07/2022   HGBA1C 8.2 (A) 06/01/2022   HGBA1C 8.0 (A) 03/02/2022    Lab Results  Component Value Date   WBC 7.0 08/24/2022   HGB 11.1 (L) 08/24/2022   HCT 33.6 (L) 08/24/2022   PLT 326 08/24/2022   GLUCOSE 89 03/02/2022   CHOL 150 03/02/2022   TRIG 37 03/02/2022   HDL 73 03/02/2022   LDLCALC 68 03/02/2022   ALT 22 03/02/2022   AST 20 03/02/2022   NA 140 03/02/2022   K 4.4 03/02/2022   CL 100 03/02/2022   CREATININE 0.82 03/02/2022   BUN 10 03/02/2022   CO2 23 03/02/2022   TSH 2.350 01/03/2021   HGBA1C 9.6 (A) 11/07/2022   MICROALBUR 0.50 12/11/2013     Assessment & Plan:   [name] was seen today for diabetes.  Diagnoses and all orders for this visit:    Encounter for PPD test -     TB Skin Test  Type 2 diabetes mellitus with long term insulin Complications from uncontrolled diabetes -diabetic retinopathy leading to blindness, diabetic nephropathy leading to dialysis, decrease in circulation decrease in sores or wound healing which may lead to amputations and increase of heart attack and stroke  -     POCT glycosylated hemoglobin (Hb A1C) -     insulin glargine (LANTUS SOLOSTAR) 100 UNIT/ML Solostar Pen; Inject 20 UNITS into THE SKIN 2 TIMES DAILY. 20 UNITS WITH breakfast AND 20 UNITS just BEFORE bedI -     POCT glycosylated hemoglobin (Hb A1C) -  Other orders -     sitaGLIPtin (JANUVIA) 25 MG tablet; Take 1 tablet (25 mg total) by mouth daily. -     naproxen (NAPROSYN) 500 MG tablet; Take 1 tablet (500 mg total) by mouth 2 (two) times daily with a meal. Take 1 tablet as needed twice daily for back pain   I have discontinued Maida I. Shonk's Lantus SoloStar and Ozempic (0.25 or 0.5 MG/DOSE). I have also changed her Lantus SoloStar. Additionally, I am having her start on sitaGLIPtin and naproxen. Lastly, I am having her maintain  her cetirizine, NIFEdipine, labetalol, oseltamivir, and Pen Needles.  Meds ordered this encounter  Medications   DISCONTD: insulin glargine (LANTUS SOLOSTAR) 100 UNIT/ML Solostar Pen    Sig: Inject 20 UNITS into THE SKIN 2 TIMES DAILY. 16 UNITS WITH breakfast AND 16 UNITS just BEFORE bedInject 16 UNITS into THE SKIN 2 TIMES DAILY. 16 UNITS WITH breakfast AND 16 UNITS just BEFORE bed    Dispense:  15 mL    Refill:  11    Order Specific Question:   Supervising Provider    Answer:   Tresa Garter [id number]   sitaGLIPtin (JANUVIA) 25 MG tablet    Sig: Take 1 tablet (25 mg total) by mouth daily.    Dispense:  90 tablet    Refill:  1    Order Specific Question:   Supervising Provider    Answer:   Tresa Garter [id number]   naproxen (NAPROSYN) 500 MG tablet    Sig: Take 1 tablet (500 mg total) by mouth 2 (  two) times daily with a meal. Take 1 tablet as needed twice daily for back pain    Dispense:  120 tablet    Refill:  1    Order Specific Question:   Supervising Provider    Answer:   Tresa Garter [id number]   insulin glargine (LANTUS SOLOSTAR) 100 UNIT/ML Solostar Pen    Sig: Inject 20 UNITS into THE SKIN 2 TIMES DAILY. In AM and  BEFORE bed    Dispense:  15 mL    Refill:  3    Order Specific Question:   Supervising Provider    Answer:   Tresa Garter [id number]     Follow-up:   Return in about 3 months (around 02/07/2023).  The above assessment and management plan was discussed with the patient. The patient verbalized understanding of and has agreed to the management plan. Patient is aware to call the clinic if symptoms fail to improve or worsen. Patient is aware when to return to the clinic for a follow-up visit. Patient educated on when it is appropriate to go to the emergency department.   Juluis Mire, NP-C

## 2022-11-07 NOTE — Telephone Encounter (Signed)
Returned call to pharmacy and spoke to [place] and provided correct dosage which is the 20 units

## 2022-11-07 NOTE — Telephone Encounter (Signed)
[facility] is calling because they received a prescription for insulin glargine (LANTUS SOLOSTAR) 100 UNIT/ML Solostar Pen [id number] and there are two different sets of instructions. One says to inject 20 units in the skin twice a day, and one says to inject 16 units once in the morning and once before bed. The pharmacy would like to know which is the correct dosage. Please advise.

## 2022-11-09 ENCOUNTER — Encounter (INDEPENDENT_AMBULATORY_CARE_PROVIDER_SITE_OTHER): Payer: Self-pay | Admitting: Primary Care

## 2022-11-09 ENCOUNTER — Encounter (INDEPENDENT_AMBULATORY_CARE_PROVIDER_SITE_OTHER): Payer: Self-pay

## 2022-11-09 LAB — TB SKIN TEST
Induration: 0 mm
TB Skin Test: NEGATIVE

## 2022-11-20 ENCOUNTER — Telehealth: Payer: Self-pay | Admitting: Emergency Medicine

## 2022-11-20 NOTE — Telephone Encounter (Signed)
Returned pt call and made pt a nurse visit for 3/20 at 10am

## 2022-11-20 NOTE — Telephone Encounter (Signed)
Copied from [place] [id number]. Topic: General - Inquiry >> Nov 20, 2022 11:49 AM Misty [name] wrote: Reason for CRM:Pt called in inquiring about the STD testing and says she's not pregnant; however, the chart reflects she is pregnant. There are no available appointments; this week pt mentioned she called the [facility] and will call them back tomorrow as instructed, mobile bus given as an option.  FYI to office.

## 2022-11-21 ENCOUNTER — Ambulatory Visit (INDEPENDENT_AMBULATORY_CARE_PROVIDER_SITE_OTHER): Payer: Commercial Managed Care - HMO

## 2022-11-21 ENCOUNTER — Other Ambulatory Visit (HOSPITAL_COMMUNITY)
Admission: RE | Admit: 2022-11-21 | Discharge: 2022-11-21 | Disposition: A | Discharge status: Home / Self Care | Payer: Commercial Managed Care - HMO | Source: Ambulatory Visit | Attending: Primary Care | Admitting: Primary Care

## 2022-11-21 DIAGNOSIS — Z113 Encounter for screening for infections with a predominantly sexual mode of transmission: Secondary | ICD-10-CM

## 2022-11-22 LAB — CERVICOVAGINAL ANCILLARY ONLY
Bacterial Vaginitis (gardnerella): POSITIVE — AB
Candida Glabrata: NEGATIVE
Candida Vaginitis: POSITIVE — AB
Chlamydia: NEGATIVE
Comment: NEGATIVE
Comment: NEGATIVE
Comment: NEGATIVE
Comment: NEGATIVE
Comment: NEGATIVE
Comment: NORMAL
Neisseria Gonorrhea: NEGATIVE
Trichomonas: NEGATIVE

## 2022-11-24 ENCOUNTER — Other Ambulatory Visit (INDEPENDENT_AMBULATORY_CARE_PROVIDER_SITE_OTHER): Payer: Self-pay | Admitting: Primary Care

## 2022-11-24 DIAGNOSIS — B379 Candidiasis, unspecified: Secondary | ICD-10-CM

## 2022-11-24 DIAGNOSIS — B9689 Other specified bacterial agents as the cause of diseases classified elsewhere: Secondary | ICD-10-CM

## 2022-11-24 MED ORDER — FLUCONAZOLE 150 MG PO TABS: 150.0000 mg | ORAL_TABLET | Freq: Every day | ORAL | 1 refills | Status: DC

## 2022-11-24 MED ORDER — METRONIDAZOLE 500 MG PO TABS: 500.0000 mg | ORAL_TABLET | Freq: Two times a day (BID) | ORAL | 0 refills | Status: DC

## 2022-11-25 NOTE — Progress Notes (Deleted)
   Subjective:     Misty [name] is a 32 y.o. female here at [facility] *** for a routine exam.  Current complaints: ***.  Personal health questionnaire reviewed: {yes/no:9010}.  Do you have a primary care provider? *** Do you feel safe at home? ***  Flowsheet Row Office Visit from 11/07/2022 in [facility]  PHQ-2 Total Score 0       Health Maintenance Due  Topic Date Due   OPHTHALMOLOGY EXAM  Never done     Risk factors for chronic health problems: Smoking: Alchohol/how much: Pt BMI: There is no height or weight on file to calculate BMI.   Gynecologic History Patient's last menstrual period was 07/20/2022 (approximate). Contraception: {method:5051} Last Pap: 02/15/2021. Results were: normal Last mammogram: n/a.   Obstetric History OB History  Gravida Para Term Preterm AB Living  3 1 0 1 1 1   SAB IAB Ectopic Multiple Live Births  1 0 0 0 1    # Outcome Date GA Lbr Len/2nd Weight Sex Delivery Anes PTL Lv  3 Current           2 Preterm 06/22/21 [redacted]w[redacted]d 14:20 / 00:53 4 lb 10.1 oz (2.1 kg) F Vag-Spont EPI  LIV  1 SAB              {Common ambulatory SmartLinks:19316}  Review of Systems {ros; complete:30496}    Objective:   VS reviewed, nursing note reviewed,  Constitutional: well developed, well nourished, no distress HEENT: normocephalic, thyroid without enlargement or mass HEART: RRR, no murmurs rubs/gallops RESP: clear and equal to auscultation bilaterally in all lobes  Breast Exam:  ***Deferred with low risks and shared decision making, discussed recommendation to start mammogram between 40-50 yo/ exam performed: right breast normal without mass, skin or nipple changes or axillary nodes, left breast normal without mass, skin or nipple changes or axillary nodes Abdomen: soft Neuro: alert and oriented x 3 Skin: warm, dry Psych: affect normal Pelvic exam: ***Deferred/ Performed: Cervix pink, visually closed, without lesion, scant white  creamy discharge, vaginal walls and external genitalia normal Bimanual exam: Cervix 0/long/high, firm, anterior, neg CMT, uterus nontender, nonenlarged, adnexa without tenderness, enlargement, or mass        Assessment/Plan:   1. Type 2 diabetes mellitus complicating pregnancy in second trimester, antepartum ***  2. Essential hypertension ***  3. Well woman exam with routine gynecological exam ***      No follow-ups on file.   Fatima Blank, CNM 7:26 PM

## 2022-11-26 ENCOUNTER — Telehealth: Payer: Self-pay | Admitting: Family Medicine

## 2022-11-26 ENCOUNTER — Ambulatory Visit: Payer: Medicaid Other | Admitting: Advanced Practice Midwife

## 2022-11-26 DIAGNOSIS — O24112 Pre-existing diabetes mellitus, type 2, in pregnancy, second trimester: Secondary | ICD-10-CM

## 2022-11-26 DIAGNOSIS — I10 Essential (primary) hypertension: Secondary | ICD-10-CM

## 2022-11-26 DIAGNOSIS — Z01419 Encounter for gynecological examination (general) (routine) without abnormal findings: Secondary | ICD-10-CM

## 2022-11-26 NOTE — Telephone Encounter (Signed)
Patient had a scheduled appointment today she called to cancel due to she can't make it,  she want to know if she can get a referral sent to her Primary Dr Juluis Mire)      so she can get her Depo inj there

## 2022-11-27 ENCOUNTER — Ambulatory Visit (INDEPENDENT_AMBULATORY_CARE_PROVIDER_SITE_OTHER): Payer: Medicaid Other

## 2022-11-28 NOTE — Telephone Encounter (Signed)
Pt has appt with PCP with Juluis Mire on 02/08/23. Colletta [place], RNC

## 2022-12-03 ENCOUNTER — Other Ambulatory Visit (INDEPENDENT_AMBULATORY_CARE_PROVIDER_SITE_OTHER): Payer: Self-pay | Admitting: Primary Care

## 2022-12-03 DIAGNOSIS — O24112 Pre-existing diabetes mellitus, type 2, in pregnancy, second trimester: Secondary | ICD-10-CM

## 2022-12-03 NOTE — Telephone Encounter (Signed)
Luke could you take care of this  ?

## 2022-12-04 ENCOUNTER — Other Ambulatory Visit: Payer: Self-pay

## 2022-12-04 NOTE — Progress Notes (Signed)
Nurse visit

## 2022-12-07 ENCOUNTER — Ambulatory Visit: Payer: Medicaid Other | Admitting: Pharmacist

## 2023-01-04 ENCOUNTER — Other Ambulatory Visit (INDEPENDENT_AMBULATORY_CARE_PROVIDER_SITE_OTHER): Payer: Self-pay | Admitting: Primary Care

## 2023-01-04 DIAGNOSIS — I10 Essential (primary) hypertension: Secondary | ICD-10-CM

## 2023-01-04 DIAGNOSIS — O24112 Pre-existing diabetes mellitus, type 2, in pregnancy, second trimester: Secondary | ICD-10-CM

## 2023-01-04 DIAGNOSIS — Z760 Encounter for issue of repeat prescription: Secondary | ICD-10-CM

## 2023-01-04 NOTE — Telephone Encounter (Signed)
Requested Prescriptions  Pending Prescriptions Disp Refills   labetalol (NORMODYNE) 100 MG tablet [Pharmacy Med Name: labetalol 100 mg tablet] 180 tablet 1    Sig: TAKE 1 TABLET BY MOUTH 2 TIMES DAILY     Cardiovascular:  Beta Blockers Passed - 01/04/2023  8:15 AM      Passed - Last BP in normal range    BP Readings from Last 1 Encounters:  11/07/22 124/82         Passed - Last Heart Rate in normal range    Pulse Readings from Last 1 Encounters:  11/07/22 66         Passed - Valid encounter within last 6 months    Recent Outpatient Visits           1 month ago Screening for STD (sexually transmitted disease)   [place] [facility]   1 month ago Type 2 diabetes mellitus with long term insulin   [place] [facility] Grayce Sessions, NP   7 months ago Need for immunization against influenza   [place] [facility] Grayce Sessions, NP   7 months ago Bumps on skin   [place] [facility] Grayce Sessions, NP   10 months ago Annual physical exam   [place] [facility] Grayce Sessions, NP       Future Appointments             In 1 month Randa Evens, Kinnie Scales, NP [place] [facility]

## 2023-01-08 ENCOUNTER — Other Ambulatory Visit (HOSPITAL_COMMUNITY)
Admission: RE | Admit: 2023-01-08 | Discharge: 2023-01-08 | Disposition: A | Discharge status: Home / Self Care | Payer: 59 | Source: Ambulatory Visit | Attending: Obstetrics & Gynecology | Admitting: Obstetrics & Gynecology

## 2023-01-08 ENCOUNTER — Encounter: Payer: Self-pay | Admitting: Obstetrics & Gynecology

## 2023-01-08 ENCOUNTER — Other Ambulatory Visit: Payer: Self-pay

## 2023-01-08 ENCOUNTER — Ambulatory Visit (INDEPENDENT_AMBULATORY_CARE_PROVIDER_SITE_OTHER): Payer: 59 | Admitting: Obstetrics & Gynecology

## 2023-01-08 VITALS — BP 136/82 | HR 67 | Ht 63.0 in | Wt 236.5 lb

## 2023-01-08 DIAGNOSIS — E1169 Type 2 diabetes mellitus with other specified complication: Secondary | ICD-10-CM | POA: Diagnosis not present

## 2023-01-08 DIAGNOSIS — O24113 Pre-existing diabetes mellitus, type 2, in pregnancy, third trimester: Secondary | ICD-10-CM | POA: Insufficient documentation

## 2023-01-08 DIAGNOSIS — Z113 Encounter for screening for infections with a predominantly sexual mode of transmission: Secondary | ICD-10-CM | POA: Diagnosis not present

## 2023-01-08 DIAGNOSIS — Z0000 Encounter for general adult medical examination without abnormal findings: Secondary | ICD-10-CM

## 2023-01-08 DIAGNOSIS — O24112 Pre-existing diabetes mellitus, type 2, in pregnancy, second trimester: Secondary | ICD-10-CM | POA: Insufficient documentation

## 2023-01-08 DIAGNOSIS — E119 Type 2 diabetes mellitus without complications: Secondary | ICD-10-CM | POA: Insufficient documentation

## 2023-01-08 MED ORDER — PRENATAL VITAMIN 27-0.8 MG PO TABS: 1.0000 | ORAL_TABLET | Freq: Every day | ORAL | 11 refills | Status: DC

## 2023-01-08 NOTE — Progress Notes (Signed)
Patient ID: Misty [name], female   DOB: 1991-08-28, 32 y.o.   MRN: [id number] Cc; recent miscarriage and anxious to conceive HPI Misty [name] is a 32 y.o. female.  [id number] Patient's last menstrual period was 12/31/2022 (approximate). She miscarried  in January in first trimester. Her cycle is now normal and she has menstrual cramps but does not treat them. She would like to conceive and is not using a BCM HPI  Past Medical History:  Diagnosis Date   Abscess 09/02/2020   Allergy    Anemia 2015   Asthma    as child   Chronic hypertension with superimposed preeclampsia 05/24/2021   Diabetes mellitus without complication ([facility]) 2015   DKA (diabetic ketoacidosis) ([facility]) 09/02/2020   Eczema    Hypertension 09/2013    Past Surgical History:  Procedure Laterality Date   Abcess on back      Family History  Problem Relation Age of Onset   Migraines Mother    Hypertension Mother    Heart disease Mother 75       CHF   Diabetes Mother    Healthy Father    Sickle cell anemia Brother    Migraines Maternal Grandmother    Diabetes Maternal Grandmother    Heart disease Maternal Grandmother    Hypertension Maternal Grandfather    Diabetes Maternal Grandfather    Heart disease Maternal Grandfather     Social History Social History   Tobacco Use   Smoking status: Former    Packs/day: 0.50    Years: 2.00    Additional pack years: 0.00    Total pack years: 1.00    Types: Cigarettes    Quit date: 09/08/2013    Years since quitting: 9.3   Smokeless tobacco: Never  Vaping Use   Vaping Use: Never used  Substance Use Topics   Alcohol use: No    Alcohol/week: 0.0 standard drinks of alcohol   Drug use: No    Allergies  Allergen Reactions   Iodides Anaphylaxis and Itching   Morphine And Related Anaphylaxis and Itching   Shellfish Allergy Anaphylaxis and Itching    Pt reports "watery eyes"   Ultram [Tramadol Hcl] Hives    Hives and swollen lips     Current Outpatient  Medications  Medication Sig Dispense Refill   cetirizine (ZYRTEC ALLERGY) 10 MG tablet Take 1 tablet (10 mg total) by mouth daily. 30 tablet 0   fluconazole (DIFLUCAN) 150 MG tablet Take 1 tablet (150 mg total) by mouth daily. 1 tablet 1   Insulin Glargine (BASAGLAR KWIKPEN) 100 UNIT/ML INJECT 20 UNITS into THE SKIN 2 TIMES DAILY - IN THE MORNING AND BEFORE bed 15 mL 3   Insulin Pen Needle (PEN NEEDLES) 32G X 4 MM MISC Use to inject insulin twice daily. Max of 2 pen needles daily. 100 each 2   labetalol (NORMODYNE) 100 MG tablet TAKE 1 TABLET BY MOUTH 2 TIMES DAILY 180 tablet 1   naproxen (NAPROSYN) 500 MG tablet Take 1 tablet (500 mg total) by mouth 2 (two) times daily with a meal. Take 1 tablet as needed twice daily for back pain 120 tablet 1   NIFEdipine (ADALAT CC) 60 MG 24 hr tablet Take 1 tablet (60 mg total) by mouth 2 (two) times daily. 180 tablet 1   oseltamivir (TAMIFLU) 75 MG capsule Take 1 capsule (75 mg total) by mouth every 12 (twelve) hours. 10 capsule 0   Prenatal Vit-Fe Fumarate-FA (PRENATAL VITAMIN) 27-0.8 MG TABS Take  1 tablet by mouth daily. 30 tablet 11   sitaGLIPtin (JANUVIA) 25 MG tablet Take 1 tablet (25 mg total) by mouth daily. 90 tablet 1   metroNIDAZOLE (FLAGYL) 500 MG tablet Take 1 tablet (500 mg total) by mouth 2 (two) times daily. 14 tablet 0   No current facility-administered medications for this visit.    Review of Systems Review of Systems  Constitutional: Negative.   Respiratory: Negative.    Cardiovascular: Negative.   Gastrointestinal: Negative.   Genitourinary:  Positive for menstrual problem (cramps, regular cycle). Negative for vaginal bleeding and vaginal discharge.    Blood pressure 136/82, pulse 67, height 5\' 3"  (1.6 m), weight 236 lb 8 oz (107.3 kg), last menstrual period 12/31/2022, unknown if currently breastfeeding.  Physical Exam Physical Exam Vitals and nursing note reviewed.  Constitutional:      Appearance: Normal appearance. She is  not ill-appearing.  HENT:     Head: Normocephalic and atraumatic.  Eyes:     Pupils: Pupils are equal, round, and reactive to light.  Cardiovascular:     Rate and Rhythm: Normal rate.  Pulmonary:     Effort: Pulmonary effort is normal.  Musculoskeletal:        General: Normal range of motion.  Skin:    General: Skin is warm and dry.  Neurological:     Mental Status: She is alert.  Psychiatric:        Mood and Affect: Mood normal.        Behavior: Behavior normal.     Data Reviewed Pap normal 2022  Assessment Screening for STD (sexually transmitted disease) - Plan: Prenatal Vit-Fe Fumarate-FA (PRENATAL VITAMIN) 27-0.8 MG TABS, GC/Chlamydia probe amp ([place])not at [facility]  Well woman exam without gynecological exam Counseling re pregnancy and discussed she should need no infertility evaluation at this time Type 2 diabetes Plan STD screening. Recommend  Recommend good diabetes control prenatal Meds ordered this encounter  Medications   DISCONTD: Prenatal Vit-Fe Fumarate-FA (PRENATAL VITAMIN) 27-0.8 MG TABS    Sig: Take 1 tablet by mouth daily.    Dispense:  30 tablet    Refill:  11   Prenatal Vit-Fe Fumarate-FA (PRENATAL VITAMIN) 27-0.8 MG TABS    Sig: Take 1 tablet by mouth daily.    Dispense:  30 tablet    Refill:  11         Scheryl Darter 01/08/2023, 9:10 AM

## 2023-01-09 LAB — GC/CHLAMYDIA PROBE AMP (~~LOC~~) NOT AT ARMC
Chlamydia: NEGATIVE
Comment: NEGATIVE
Comment: NORMAL
Neisseria Gonorrhea: NEGATIVE

## 2023-02-04 ENCOUNTER — Other Ambulatory Visit: Payer: Self-pay | Admitting: Pharmacist

## 2023-02-04 ENCOUNTER — Other Ambulatory Visit: Payer: Self-pay

## 2023-02-04 ENCOUNTER — Other Ambulatory Visit (INDEPENDENT_AMBULATORY_CARE_PROVIDER_SITE_OTHER): Payer: Self-pay | Admitting: Primary Care

## 2023-02-04 MED ORDER — LINAGLIPTIN 5 MG PO TABS: 5.0000 mg | ORAL_TABLET | Freq: Every day | ORAL | 6 refills | Status: DC

## 2023-02-05 NOTE — Telephone Encounter (Signed)
Unable to refill per protocol, Rx expired. Discontinued 02/04/23, not covered by patient's insurance.  Requested Prescriptions  Pending Prescriptions Disp Refills   JANUVIA 25 MG tablet [Pharmacy Med Name: Januvia 25 mg tablet] 90 tablet 1    Sig: TAKE 1 TABLET BY MOUTH EVERY DAY     Endocrinology:  Diabetes - DPP-4 Inhibitors Failed - 02/04/2023  8:27 AM      Failed - HBA1C is between 0 and 7.9 and within 180 days    HbA1c, POC (controlled diabetic range)  Date Value Ref Range Status  11/07/2022 9.6 (A) 0.0 - 7.0 % Final         Passed - Cr in normal range and within 360 days    Creat  Date Value Ref Range Status  03/19/2014 0.90 0.50 - 1.10 mg/dL Final   Creatinine, Ser  Date Value Ref Range Status  03/02/2022 0.82 0.57 - 1.00 mg/dL Final   Creatinine, Urine  Date Value Ref Range Status  06/17/2021 41.24 mg/dL Final         Passed - Valid encounter within last 6 months    Recent Outpatient Visits           2 months ago Screening for STD (sexually transmitted disease)   [place] [facility]   3 months ago Type 2 diabetes mellitus with long term insulin   [place] [facility] Grayce Sessions, Misty [name]   8 months ago Need for immunization against influenza   [place] [facility] Grayce Sessions, Misty [name]   8 months ago Bumps on skin   [place] [facility] Grayce Sessions, Misty [name]   11 months ago Annual physical exam   [place] [facility] Grayce Sessions, Misty [name]       Future Appointments             In 3 days Misty [name], Misty Scales, Misty [name] [place] [facility]

## 2023-02-08 ENCOUNTER — Ambulatory Visit (INDEPENDENT_AMBULATORY_CARE_PROVIDER_SITE_OTHER): Payer: 59 | Admitting: Primary Care

## 2023-02-08 DIAGNOSIS — I10 Essential (primary) hypertension: Secondary | ICD-10-CM

## 2023-02-08 DIAGNOSIS — Z794 Long term (current) use of insulin: Secondary | ICD-10-CM | POA: Diagnosis not present

## 2023-02-08 DIAGNOSIS — E1169 Type 2 diabetes mellitus with other specified complication: Secondary | ICD-10-CM | POA: Diagnosis not present

## 2023-02-08 NOTE — Progress Notes (Unsigned)
Wants Dexcom for DM

## 2023-02-09 LAB — CBC WITH DIFFERENTIAL/PLATELET
Basophils Absolute: 0 10*3/uL (ref 0.0–0.2)
Basos: 1 %
EOS (ABSOLUTE): 0 10*3/uL (ref 0.0–0.4)
Eos: 1 %
Hematocrit: 38.2 % (ref 34.0–46.6)
Hemoglobin: 12.1 g/dL (ref 11.1–15.9)
Immature Grans (Abs): 0 10*3/uL (ref 0.0–0.1)
Immature Granulocytes: 0 %
Lymphocytes Absolute: 1.7 10*3/uL (ref 0.7–3.1)
Lymphs: 32 %
MCH: 29.5 pg (ref 26.6–33.0)
MCHC: 31.7 g/dL (ref 31.5–35.7)
MCV: 93 fL (ref 79–97)
Monocytes Absolute: 0.4 10*3/uL (ref 0.1–0.9)
Monocytes: 8 %
Neutrophils Absolute: 3.2 10*3/uL (ref 1.4–7.0)
Neutrophils: 58 %
Platelets: 318 10*3/uL (ref 150–450)
RBC: 4.1 x10E6/uL (ref 3.77–5.28)
RDW: 13.2 % (ref 11.7–15.4)
WBC: 5.4 10*3/uL (ref 3.4–10.8)

## 2023-02-09 LAB — CMP14+EGFR
ALT: 15 IU/L (ref 0–32)
AST: 16 IU/L (ref 0–40)
Albumin/Globulin Ratio: 1.4 (ref 1.2–2.2)
Albumin: 4.6 g/dL (ref 3.9–4.9)
Alkaline Phosphatase: 94 IU/L (ref 44–121)
BUN/Creatinine Ratio: 10 (ref 9–23)
BUN: 8 mg/dL (ref 6–20)
Bilirubin Total: 0.3 mg/dL (ref 0.0–1.2)
CO2: 21 mmol/L (ref 20–29)
Calcium: 9.8 mg/dL (ref 8.7–10.2)
Chloride: 99 mmol/L (ref 96–106)
Creatinine, Ser: 0.77 mg/dL (ref 0.57–1.00)
Globulin, Total: 3.2 g/dL (ref 1.5–4.5)
Glucose: 178 mg/dL — ABNORMAL HIGH (ref 70–99)
Potassium: 4.7 mmol/L (ref 3.5–5.2)
Sodium: 137 mmol/L (ref 134–144)
Total Protein: 7.8 g/dL (ref 6.0–8.5)
eGFR: 105 mL/min/{1.73_m2} (ref >59)

## 2023-02-09 LAB — LIPID PANEL
Chol/HDL Ratio: 2 ratio (ref 0.0–4.4)
Cholesterol, Total: 164 mg/dL (ref 100–199)
HDL: 84 mg/dL (ref >39)
LDL Chol Calc (NIH): 71 mg/dL (ref 0–99)
Triglycerides: 40 mg/dL (ref 0–149)
VLDL Cholesterol Cal: 9 mg/dL (ref 5–40)

## 2023-02-09 LAB — MICROALBUMIN / CREATININE URINE RATIO
Creatinine, Urine: 213.5 mg/dL
Microalb/Creat Ratio: 16 mg/g creat (ref 0–29)
Microalbumin, Urine: 33.7 ug/mL

## 2023-02-09 NOTE — Progress Notes (Signed)
[facility]  Misty [name], is a 32 y.o. female  MVH:[id number]  WUX:[id number]  DOB - 03/27/1991  Chief Complaint  Patient presents with   Diabetes       Subjective:   Misty [name] is a 32 y.o. female here today for a follow up visit for HTN. Patient has No headache, No chest pain, No abdominal pain - No Nausea, No new weakness tingling or numbness, No Cough - shortness of breath. DM2- denies polyuria, polydipsia , polyphagia or vision changes. She does c/o having to prick her finger for BS reading and would like a dexacom. Explain would refer to clinical pharmacist. Note she was on it during pregnancy,  No problems updated.  Allergies  Allergen Reactions   Iodides Anaphylaxis and Itching   Morphine And Codeine Anaphylaxis and Itching   Shellfish Allergy Anaphylaxis and Itching    Pt reports "watery eyes"   Ultram [Tramadol Hcl] Hives    Hives and swollen lips     Past Medical History:  Diagnosis Date   Abscess 09/02/2020   Allergy    Anemia 2015   Asthma    as child   Chronic hypertension with superimposed preeclampsia 05/24/2021   Diabetes mellitus without complication (HCC) 2015   DKA (diabetic ketoacidosis) (HCC) 09/02/2020   Eczema    Hypertension 09/2013    Current Outpatient Medications on File Prior to Visit  Medication Sig Dispense Refill   cetirizine (ZYRTEC ALLERGY) 10 MG tablet Take 1 tablet (10 mg total) by mouth daily. 30 tablet 0   Insulin Glargine (BASAGLAR KWIKPEN) 100 UNIT/ML INJECT 20 UNITS into THE SKIN 2 TIMES DAILY - IN THE MORNING AND BEFORE bed 15 mL 3   labetalol (NORMODYNE) 100 MG tablet TAKE 1 TABLET BY MOUTH 2 TIMES DAILY 180 tablet 1   naproxen (NAPROSYN) 500 MG tablet Take 1 tablet (500 mg total) by mouth 2 (two) times daily with a meal. Take 1 tablet as needed twice daily for back pain 120 tablet 1   NIFEdipine (ADALAT CC) 60 MG 24 hr tablet Take 1 tablet (60 mg total) by mouth 2 (two) times daily. 180 tablet 1    Prenatal Vit-Fe Fumarate-FA (PRENATAL VITAMIN) 27-0.8 MG TABS Take 1 tablet by mouth daily. 30 tablet 11   sitaGLIPtin (JANUVIA) 25 MG tablet Take 25 mg by mouth daily.     Insulin Pen Needle (PEN NEEDLES) 32G X 4 MM MISC Use to inject insulin twice daily. Max of 2 pen needles daily. 100 each 2   linagliptin (TRADJENTA) 5 MG TABS tablet Take 1 tablet (5 mg total) by mouth daily. 30 tablet 6   No current facility-administered medications on file prior to visit.    Objective:  There were no vitals filed for this visit.  Comprehensive ROS Pertinent positive and negative noted in HPI   Exam General appearance : Awake, alert, not in any distress. Speech Clear. Not toxic looking HEENT: Atraumatic and Normocephalic, pupils equally reactive to light and accomodation Neck: Supple, no JVD. No cervical lymphadenopathy.  Chest: Good air entry bilaterally, no added sounds  CVS: S1 S2 regular, no murmurs.  Abdomen: Bowel sounds present, Non tender and not distended with no gaurding, rigidity or rebound. Extremities: B/L Lower Ext shows no edema, both legs are warm to touch Neurology: Awake alert, and oriented X 3, CN II-XII intact, Non focal Skin: No Rash  Data Review Lab Results  Component Value Date   HGBA1C 9.6 (A) 11/07/2022   HGBA1C 8.2 (A)  06/01/2022   HGBA1C 8.0 (A) 03/02/2022    Assessment & Plan   [name] was seen today for diabetes.  Diagnoses and all orders for this visit:  Type 2 diabetes mellitus with other specified complication, unspecified whether long term insulin use (HCC)  Complications from uncontrolled diabetes -diabetic retinopathy leading to blindness, diabetic nephropathy leading to dialysis, decrease in circulation decrease in sores or wound healing which may lead to amputations and increase of heart attack and stroke  Requesting a dexacom -     CBC with Differential -     Microalbumin / creatinine urine ratio  Essential hypertension BP goal - <  130/80 Explained that having normal blood pressure is the goal and medications are helping to get to goal and maintain normal blood pressure. DIET: Limit salt intake, read nutrition labels to check salt content, limit fried and high fatty foods  Avoid using multisymptom OTC cold preparations that generally contain sudafed which can rise BP. Consult with pharmacist on best cold relief products to use for persons with HTN EXERCISE Discussed incorporating exercise such as walking - 30 minutes most days of the week and can do in 10 minute intervals    -     CMP14+EGFR -     Lipid Panel     Patient have been counseled extensively about nutrition and exercise. Other issues discussed during this visit include: low cholesterol diet, weight control and daily exercise, foot care, annual eye examinations at Ophthalmology, importance of adherence with medications and regular follow-up. We also discussed long term complications of uncontrolled diabetes and hypertension.   No follow-ups on file.  The patient was given clear instructions to go to ER or return to medical center if symptoms don't improve, worsen or new problems develop. The patient verbalized understanding. The patient was told to call to get lab results if they haven't heard anything in the next week.   This note has been created with Education officer, environmental. Any transcriptional errors are unintentional.   Grayce Sessions, NP 02/09/2023, 10:55 PM

## 2023-02-12 ENCOUNTER — Ambulatory Visit: Payer: Commercial Managed Care - HMO | Admitting: Pharmacist

## 2023-02-20 ENCOUNTER — Telehealth (INDEPENDENT_AMBULATORY_CARE_PROVIDER_SITE_OTHER): Payer: Self-pay

## 2023-02-20 NOTE — Telephone Encounter (Signed)
Copied from CRM [id number]. Topic: Appointment Scheduling - Scheduling Inquiry for Clinic >> Feb 20, 2023  3:27 PM Epimenio Foot F wrote: Reason for CRM: Pt is calling in because she would like to reschedule her appointment with [facility]. Pt says she would like something around the 8th or 9th of July, and she would prefer 11 or 11:30. Pt says the appointment can be scheduled for either the 8th or 9th of July at 11 or 11:30. If those dates aren't available please call pt.

## 2023-02-21 ENCOUNTER — Ambulatory Visit: Payer: Commercial Managed Care - HMO | Admitting: Pharmacist

## 2023-02-25 ENCOUNTER — Other Ambulatory Visit: Payer: Self-pay

## 2023-03-11 ENCOUNTER — Ambulatory Visit: Payer: Commercial Managed Care - HMO | Admitting: Pharmacist

## 2023-03-19 ENCOUNTER — Ambulatory Visit: Payer: Commercial Managed Care - HMO | Admitting: Pharmacist

## 2023-03-27 ENCOUNTER — Other Ambulatory Visit (INDEPENDENT_AMBULATORY_CARE_PROVIDER_SITE_OTHER): Payer: Self-pay | Admitting: Primary Care

## 2023-03-27 DIAGNOSIS — O24112 Pre-existing diabetes mellitus, type 2, in pregnancy, second trimester: Secondary | ICD-10-CM

## 2023-04-04 ENCOUNTER — Ambulatory Visit: Payer: 59 | Admitting: Family Medicine

## 2023-04-04 ENCOUNTER — Telehealth (INDEPENDENT_AMBULATORY_CARE_PROVIDER_SITE_OTHER): Payer: Self-pay | Admitting: Primary Care

## 2023-04-04 ENCOUNTER — Ambulatory Visit (INDEPENDENT_AMBULATORY_CARE_PROVIDER_SITE_OTHER): Payer: Self-pay

## 2023-04-04 DIAGNOSIS — A599 Trichomoniasis, unspecified: Secondary | ICD-10-CM

## 2023-04-04 HISTORY — DX: Trichomoniasis, unspecified: A59.9

## 2023-04-04 NOTE — Telephone Encounter (Signed)
Pt is calling back - reporting that after speaking to NT - Advised if ok to continue to take Insulin Glargine [facility]) 100 UNIT/ML [[id number]] while pregnant. Pt states that she spoke to her pharmacist. And that it is ok. Pt is calling back to inform the nurse.

## 2023-04-04 NOTE — Telephone Encounter (Signed)
  Chief Complaint: Nausea& vomiting, fatigue during 1st trimester Symptoms: above Frequency: 1 week Pertinent Negatives: Patient denies  Disposition: [] ED /[x] Urgent Care (no appt availability in office) / [x] Appointment(In office/virtual)/ []  [facility]/ [] Home Care/ [x] Refused Recommended Disposition /[] [place]/ []  Follow-up with PCP Additional Notes: Pt states that she is pregnant. She missed her last period and has a positive pregnancy test. Pt has a 83 month old at home. Last pregnancy was high risk. Pt had HTN, and is diabetic. Pt also had vomiting issues with last pregnancy. She was taking Zofran and had a patch for nausea. Currently pt is very tired, has nausea and is vomiting. She states she is only able to keep chicken broth and crackers down.  Pt had an appt for today, but was unable to go. Her ride did not come to get her. The ride service needs a 2 day notice. Pt also called OB that cared for her during her last pregnancy, first available appt is in October.  Advised [facility] ED for care, and appt with OB ASAP. Pt states she cannot bring her other child with her to hospital and has no one to watch her.  Pt will go as soon as possible.  Please advise.  Summary: Vomitting, nausea, pregancy advice   Pt is calling to report that she took a home pregnancy test - test was positive- pt reports no menstrual for 1 month. Vomiting unable to keep food down, nausea, and fatigue     Reason for Disposition  Nausea lasts > 1 week  Answer Assessment - Initial Assessment Questions 1. NAUSEA SEVERITY: "How bad is the nausea?" (e.g., mild, moderate, severe; dehydration, weight loss)   - MILD: loss of appetite without change in eating habits   - MODERATE: decreased oral intake without significant weight loss, dehydration, or malnutrition   - SEVERE: inadequate caloric or fluid intake, significant weight loss, symptoms of dehydration     moderate 2. ONSET:  "When did the nausea begin?"     1 week 3. VOMITING: "Any vomiting?" If Yes, ask: "How many times today?"     yes 4. RECURRENT SYMPTOM: "Have you had nausea before?" If Yes, ask: "When was the last time?" "What happened that time?"     Yes - last pregnancy 5. CAUSE: "What do you think is causing the nausea?"     Pregnancy 6. PREGNANCY: "Is there any chance you are pregnant?" (e.g., unprotected intercourse, missed birth control pill, broken condom)     Yes - positive pregnancy test.  Protocols used: Nausea-A-AH

## 2023-04-05 ENCOUNTER — Other Ambulatory Visit (INDEPENDENT_AMBULATORY_CARE_PROVIDER_SITE_OTHER): Payer: Self-pay | Admitting: Primary Care

## 2023-04-05 ENCOUNTER — Emergency Department (HOSPITAL_COMMUNITY)
Admission: EM | Admit: 2023-04-05 | Discharge: 2023-04-05 | Disposition: A | Discharge status: Home / Self Care | Payer: 59

## 2023-04-05 ENCOUNTER — Emergency Department (HOSPITAL_COMMUNITY): Payer: 59

## 2023-04-05 ENCOUNTER — Other Ambulatory Visit: Payer: Self-pay

## 2023-04-05 ENCOUNTER — Encounter (INDEPENDENT_AMBULATORY_CARE_PROVIDER_SITE_OTHER): Payer: Self-pay | Admitting: Primary Care

## 2023-04-05 ENCOUNTER — Encounter (HOSPITAL_COMMUNITY): Payer: Self-pay

## 2023-04-05 DIAGNOSIS — O10011 Pre-existing essential hypertension complicating pregnancy, first trimester: Secondary | ICD-10-CM | POA: Diagnosis not present

## 2023-04-05 DIAGNOSIS — O208 Other hemorrhage in early pregnancy: Secondary | ICD-10-CM | POA: Diagnosis not present

## 2023-04-05 DIAGNOSIS — O219 Vomiting of pregnancy, unspecified: Secondary | ICD-10-CM | POA: Diagnosis not present

## 2023-04-05 DIAGNOSIS — R112 Nausea with vomiting, unspecified: Secondary | ICD-10-CM | POA: Diagnosis present

## 2023-04-05 DIAGNOSIS — A5901 Trichomonal vulvovaginitis: Secondary | ICD-10-CM | POA: Diagnosis not present

## 2023-04-05 DIAGNOSIS — A599 Trichomoniasis, unspecified: Secondary | ICD-10-CM

## 2023-04-05 DIAGNOSIS — O2391 Unspecified genitourinary tract infection in pregnancy, first trimester: Secondary | ICD-10-CM | POA: Insufficient documentation

## 2023-04-05 DIAGNOSIS — O3481 Maternal care for other abnormalities of pelvic organs, first trimester: Secondary | ICD-10-CM | POA: Diagnosis not present

## 2023-04-05 DIAGNOSIS — E1169 Type 2 diabetes mellitus with other specified complication: Secondary | ICD-10-CM

## 2023-04-05 DIAGNOSIS — Z3A01 Less than 8 weeks gestation of pregnancy: Secondary | ICD-10-CM | POA: Diagnosis not present

## 2023-04-05 DIAGNOSIS — O26891 Other specified pregnancy related conditions, first trimester: Secondary | ICD-10-CM | POA: Diagnosis not present

## 2023-04-05 DIAGNOSIS — Z3201 Encounter for pregnancy test, result positive: Secondary | ICD-10-CM | POA: Insufficient documentation

## 2023-04-05 DIAGNOSIS — I10 Essential (primary) hypertension: Secondary | ICD-10-CM

## 2023-04-05 LAB — CBC WITH DIFFERENTIAL/PLATELET
Abs Immature Granulocytes: 0.02 10*3/uL (ref 0.00–0.07)
Basophils Absolute: 0 10*3/uL (ref 0.0–0.1)
Basophils Relative: 0 %
Eosinophils Absolute: 0 10*3/uL (ref 0.0–0.5)
Eosinophils Relative: 0 %
HCT: 34.4 % — ABNORMAL LOW (ref 36.0–46.0)
Hemoglobin: 11.7 g/dL — ABNORMAL LOW (ref 12.0–15.0)
Immature Granulocytes: 0 %
Lymphocytes Relative: 24 %
Lymphs Abs: 2 10*3/uL (ref 0.7–4.0)
MCH: 31 pg (ref 26.0–34.0)
MCHC: 34 g/dL (ref 30.0–36.0)
MCV: 91.2 fL (ref 80.0–100.0)
Monocytes Absolute: 0.7 10*3/uL (ref 0.1–1.0)
Monocytes Relative: 8 %
Neutro Abs: 5.4 10*3/uL (ref 1.7–7.7)
Neutrophils Relative %: 68 %
Platelets: 327 10*3/uL (ref 150–400)
RBC: 3.77 MIL/uL — ABNORMAL LOW (ref 3.87–5.11)
RDW: 13 % (ref 11.5–15.5)
WBC: 8.1 10*3/uL (ref 4.0–10.5)
nRBC: 0 % (ref 0.0–0.2)

## 2023-04-05 LAB — WET PREP, GENITAL
Clue Cells Wet Prep HPF POC: NONE SEEN
Sperm: NONE SEEN
WBC, Wet Prep HPF POC: >=10 — AB (ref <10)
Yeast Wet Prep HPF POC: NONE SEEN

## 2023-04-05 LAB — URINALYSIS, ROUTINE W REFLEX MICROSCOPIC
Bilirubin Urine: NEGATIVE
Glucose, UA: 50 mg/dL — AB
Hgb urine dipstick: NEGATIVE
Ketones, ur: 20 mg/dL — AB
Nitrite: NEGATIVE
Protein, ur: 100 mg/dL — AB
RBC / HPF: >50 RBC/hpf (ref 0–5)
Specific Gravity, Urine: 1.03 (ref 1.005–1.030)
WBC, UA: >50 WBC/hpf (ref 0–5)
pH: 5 (ref 5.0–8.0)

## 2023-04-05 LAB — COMPREHENSIVE METABOLIC PANEL
ALT: 16 U/L (ref 0–44)
AST: 19 U/L (ref 15–41)
Albumin: 3.9 g/dL (ref 3.5–5.0)
Alkaline Phosphatase: 83 U/L (ref 38–126)
Anion gap: 11 (ref 5–15)
BUN: 5 mg/dL — ABNORMAL LOW (ref 6–20)
CO2: 22 mmol/L (ref 22–32)
Calcium: 9.3 mg/dL (ref 8.9–10.3)
Chloride: 100 mmol/L (ref 98–111)
Creatinine, Ser: 0.89 mg/dL (ref 0.44–1.00)
GFR, Estimated: >60 mL/min (ref >60)
Glucose, Bld: 223 mg/dL — ABNORMAL HIGH (ref 70–99)
Potassium: 3.6 mmol/L (ref 3.5–5.1)
Sodium: 133 mmol/L — ABNORMAL LOW (ref 135–145)
Total Bilirubin: 0.5 mg/dL (ref 0.3–1.2)
Total Protein: 7.7 g/dL (ref 6.5–8.1)

## 2023-04-05 LAB — HCG, SERUM, QUALITATIVE: Preg, Serum: POSITIVE — AB

## 2023-04-05 LAB — LIPASE, BLOOD: Lipase: 28 U/L (ref 11–51)

## 2023-04-05 MED ORDER — METRONIDAZOLE 500 MG PO TABS
2000.0000 mg | ORAL_TABLET | Freq: Once | ORAL | Status: AC
  Administered 2023-04-05: 2000 mg via ORAL
  Filled 2023-04-05: qty 4

## 2023-04-05 MED ORDER — ONDANSETRON 4 MG PO TBDP
4.0000 mg | ORAL_TABLET | Freq: Once | ORAL | Status: AC
  Administered 2023-04-05: 4 mg via ORAL
  Filled 2023-04-05: qty 1

## 2023-04-05 MED ORDER — ACETAMINOPHEN 500 MG PO TABS
1000.0000 mg | ORAL_TABLET | Freq: Once | ORAL | Status: AC
  Administered 2023-04-05: 1000 mg via ORAL
  Filled 2023-04-05: qty 2

## 2023-04-05 NOTE — MAU Note (Signed)
Pt says she has upper abd pain - started yesterday 5/10-   Pt went to [facility] ER at 6pm- by EMS She's had Zofran- helped nausea Had labs drawn

## 2023-04-05 NOTE — MAU Provider Note (Signed)
History     CSN: [id number]  Arrival date and time: 04/05/23 1836   Event Date/Time   First Provider Initiated Contact with Patient 04/05/23 2051      Chief Complaint  Patient presents with   Nausea   Abdominal Pain    Misty [name] is a 32 y.o. [id number] at [redacted]w[redacted]d by Unsure of LMP.  She presents today for abdominal pain and nausea.  She reports she arrived, via EMS, to [facility] ER and was given Zofran with resolution of nausea.  She states her abdominal pain started yesterday and is a cramping nature.  She denies worsening or relieving factors.  She denies recent sexual activity.   OB History     Gravida  4   Para  1   Term  0   Preterm  1   AB  2   Living  1      SAB  2   IAB  0   Ectopic  0   Multiple  0   Live Births  1           Past Medical History:  Diagnosis Date   Abscess 09/02/2020   Allergy    Anemia 2015   Asthma    as child   Chronic hypertension with superimposed preeclampsia 05/24/2021   Diabetes mellitus without complication (HCC) 2015   DKA (diabetic ketoacidosis) (HCC) 09/02/2020   Eczema    Hypertension 09/2013    Past Surgical History:  Procedure Laterality Date   Abcess on back      Family History  Problem Relation Age of Onset   Migraines Mother    Hypertension Mother    Heart disease Mother 69       CHF   Diabetes Mother    Healthy Father    Sickle cell anemia Brother    Migraines Maternal Grandmother    Diabetes Maternal Grandmother    Heart disease Maternal Grandmother    Hypertension Maternal Grandfather    Diabetes Maternal Grandfather    Heart disease Maternal Grandfather     Social History   Tobacco Use   Smoking status: Former    Current packs/day: 0.00    Average packs/day: 0.5 packs/day for 2.0 years (1.0 ttl pk-yrs)    Types: Cigarettes    Start date: 09/09/2011    Quit date: 09/08/2013    Years since quitting: 9.5   Smokeless tobacco: Never  Vaping Use   Vaping status: Never Used  Substance Use  Topics   Alcohol use: No    Alcohol/week: 0.0 standard drinks of alcohol   Drug use: No    Allergies:  Allergies  Allergen Reactions   Iodides Anaphylaxis and Itching   Morphine And Codeine Anaphylaxis and Itching   Shellfish Allergy Anaphylaxis and Itching    Pt reports "watery eyes"   Ultram [Tramadol Hcl] Hives    Hives and swollen lips     Medications Prior to Admission  Medication Sig Dispense Refill Last Dose   cetirizine (ZYRTEC ALLERGY) 10 MG tablet Take 1 tablet (10 mg total) by mouth daily. 30 tablet 0    Insulin Glargine (BASAGLAR KWIKPEN) 100 UNIT/ML INJECT 20 UNITS into THE SKIN 2 TIMES DAILY - IN THE MORNING AND BEFORE bed 15 mL 3    Insulin Pen Needle (PEN NEEDLES) 32G X 4 MM MISC Use to inject insulin twice daily. Max of 2 pen needles daily. 100 each 2    labetalol (NORMODYNE) 100 MG tablet TAKE 1  TABLET BY MOUTH 2 TIMES DAILY 180 tablet 1    naproxen (NAPROSYN) 500 MG tablet Take 1 tablet (500 mg total) by mouth 2 (two) times daily with a meal. Take 1 tablet as needed twice daily for back pain 120 tablet 1    NIFEdipine (ADALAT CC) 60 MG 24 hr tablet Take 1 tablet (60 mg total) by mouth 2 (two) times daily. 180 tablet 1    Prenatal Vit-Fe Fumarate-FA (PRENATAL VITAMIN) 27-0.8 MG TABS Take 1 tablet by mouth daily. 30 tablet 11     Review of Systems  Gastrointestinal:  Positive for abdominal pain and nausea. Negative for constipation, diarrhea and vomiting.  Genitourinary:  Negative for difficulty urinating, dysuria, vaginal bleeding and vaginal discharge.   Physical Exam   Blood pressure 139/80, pulse 65, temperature 98.2 F (36.8 C), temperature source Oral, resp. rate 16, height 5' 3.5" (1.613 m), weight 105.5 kg, last menstrual period 02/10/2023, SpO2 100%, not currently breastfeeding.  Physical Exam Vitals reviewed. Exam conducted with a chaperone present.  Constitutional:      General: She is not in acute distress.    Appearance: She is well-developed.   HENT:     Head: Normocephalic and atraumatic.  Eyes:     Conjunctiva/sclera: Conjunctivae normal.  Cardiovascular:     Rate and Rhythm: Normal rate.  Pulmonary:     Effort: Pulmonary effort is normal. No respiratory distress.  Musculoskeletal:     Cervical back: Normal range of motion.  Neurological:     Mental Status: She is alert and oriented to person, place, and time.  Psychiatric:        Mood and Affect: Mood normal.        Behavior: Behavior normal.     MAU Course  Procedures Results for orders placed or performed during the hospital encounter of 04/05/23 (from the past 24 hour(s))  CBC with Differential     Status: Abnormal   Collection Time: 04/05/23  6:48 PM  Result Value Ref Range   WBC 8.1 4.0 - 10.5 K/uL   RBC 3.77 (L) 3.87 - 5.11 MIL/uL   Hemoglobin 11.7 (L) 12.0 - 15.0 g/dL   HCT 81.1 (L) 91.4 - 78.2 %   MCV 91.2 80.0 - 100.0 fL   MCH 31.0 26.0 - 34.0 pg   MCHC 34.0 30.0 - 36.0 g/dL   RDW 95.6 21.3 - 08.6 %   Platelets 327 150 - 400 K/uL   nRBC 0.0 0.0 - 0.2 %   Neutrophils Relative % 68 %   Neutro Abs 5.4 1.7 - 7.7 K/uL   Lymphocytes Relative 24 %   Lymphs Abs 2.0 0.7 - 4.0 K/uL   Monocytes Relative 8 %   Monocytes Absolute 0.7 0.1 - 1.0 K/uL   Eosinophils Relative 0 %   Eosinophils Absolute 0.0 0.0 - 0.5 K/uL   Basophils Relative 0 %   Basophils Absolute 0.0 0.0 - 0.1 K/uL   Immature Granulocytes 0 %   Abs Immature Granulocytes 0.02 0.00 - 0.07 K/uL  Comprehensive metabolic panel     Status: Abnormal   Collection Time: 04/05/23  6:48 PM  Result Value Ref Range   Sodium 133 (L) 135 - 145 mmol/L   Potassium 3.6 3.5 - 5.1 mmol/L   Chloride 100 98 - 111 mmol/L   CO2 22 22 - 32 mmol/L   Glucose, Bld 223 (H) 70 - 99 mg/dL   BUN 5 (L) 6 - 20 mg/dL   Creatinine, Ser 5.78 0.44 -  1.00 mg/dL   Calcium 9.3 8.9 - 69.6 mg/dL   Total Protein 7.7 6.5 - 8.1 g/dL   Albumin 3.9 3.5 - 5.0 g/dL   AST 19 15 - 41 U/L   ALT 16 0 - 44 U/L   Alkaline Phosphatase  83 38 - 126 U/L   Total Bilirubin 0.5 0.3 - 1.2 mg/dL   GFR, Estimated >29 >52 mL/min   Anion gap 11 5 - 15  Lipase, blood     Status: None   Collection Time: 04/05/23  6:48 PM  Result Value Ref Range   Lipase 28 11 - 51 U/L  hCG, serum, qualitative     Status: Abnormal   Collection Time: 04/05/23  6:48 PM  Result Value Ref Range   Preg, Serum POSITIVE (A) NEGATIVE  Urinalysis, Routine w reflex microscopic -Urine, Clean Catch     Status: Abnormal   Collection Time: 04/05/23  7:00 PM  Result Value Ref Range   Color, Urine AMBER (A) YELLOW   APPearance CLOUDY (A) CLEAR   Specific Gravity, Urine 1.030 1.005 - 1.030   pH 5.0 5.0 - 8.0   Glucose, UA 50 (A) NEGATIVE mg/dL   Hgb urine dipstick NEGATIVE NEGATIVE   Bilirubin Urine NEGATIVE NEGATIVE   Ketones, ur 20 (A) NEGATIVE mg/dL   Protein, ur 841 (A) NEGATIVE mg/dL   Nitrite NEGATIVE NEGATIVE   Leukocytes,Ua LARGE (A) NEGATIVE   RBC / HPF >50 0 - 5 RBC/hpf   WBC, UA >50 0 - 5 WBC/hpf   Bacteria, UA RARE (A) NONE SEEN   Squamous Epithelial / HPF 6-10 0 - 5 /HPF   Mucus PRESENT   Wet prep, genital     Status: Abnormal   Collection Time: 04/05/23  8:56 PM   Specimen: PATH Cytology Cervicovaginal Ancillary Only  Result Value Ref Range   Yeast Wet Prep HPF POC NONE SEEN NONE SEEN   Trich, Wet Prep PRESENT (A) NONE SEEN   Clue Cells Wet Prep HPF POC NONE SEEN NONE SEEN   WBC, Wet Prep HPF POC >=10 (A) <10   Sperm NONE SEEN    US OB LESS THAN 14 WEEKS WITH OB TRANSVAGINAL  Result Date: 04/05/2023 CLINICAL DATA:  [id number] Abdominal pain during pregnancy [id number]. Last menstrual period 02/10/2023. Estimated due date by last menstrual period 11/17/2023. Gestational age by last menstrual period 7 weeks and 5 days. EXAM: OBSTETRIC <14 WK [place] AND TRANSVAGINAL OB US TECHNIQUE: Both transabdominal and transvaginal ultrasound examinations were performed for complete evaluation of the gestation as well as the maternal uterus, adnexal regions, and  pelvic cul-de-sac. Transvaginal technique was performed to assess early pregnancy. COMPARISON:  None Available. FINDINGS: Intrauterine gestational sac: Single Yolk sac:  Visualized. Embryo:  Visualized. Cardiac Activity: Visualized. Heart Rate: 146  bpm CRL:  11.8 mm   7 w   3 d                  [place] EDC: 11/19/2023 Subchorionic hemorrhage:  Small. Maternal uterus/adnexae: Bilateral ovaries are unremarkable. Corpus luteum cyst within the right ovary. Other: No free fluid. IMPRESSION: 1. Single live intrauterine pregnancy with gestational age by ultrasound of 7 weeks and 3 days which is concordant with gestational age by last menstrual period of 7 weeks and 5 days. 2. Small volume subchorionic hemorrhage. Electronically Signed   By: Tish Frederickson M.D.   On: 04/05/2023 21:43     MDM Cultures; Wet Prep and GC/CT Labs: UA, UPT, CBC, hCG, ABO  Ultrasound Pain Medication Assessment and Plan  32 year old [name] at 7.5 weeks Nausea Abdominal Pain  -POC Reviewed. -Labs ordered in [facility]-Reviewed. -Cultures ordered and to be collected by patient. -Patient offered and accepts pain medication. -Send for [place] and await results. -Cautioned that d/t unknown GA [place] may not reveal findings and further evaluation necessary.   Cherre Robins 04/05/2023, 8:54 PM   Reassessment (10:32 PM) -Results as above. -Patient informed of US findings. -Educated on [facility], what to expect including bleeding, risks for miscarriage, and resolution.  -Bleeding Precautions reviewed. -Patient informed of trichomoniasis and treatment. -Metronidazole ordered. -Patient offered and accepts EPT for Boyce Medici DOB unknown. -Discussed avoidance of sexual activity until 2 weeks post treatment. -Patient without questions and verbalizes understanding. -Encouraged to start ob care or return to MAU if symptoms worsen or with the onset of new symptoms. -Discharged to home in stable condition.  Cherre Robins MSN, CNM Advanced Practice  Provider, [facility] [employer]

## 2023-04-05 NOTE — Discharge Instructions (Signed)
  [facility]          [facility]  [address], [place], [place] [address]  [phone]  [facility]  [address], [place], [place] [address]  [phone]  [facility]  [address], [place], [place] [address]  [phone]  [facility] [address], [place], [place] [address] [phone]  [facility] [place]  [address], [place], [place] [address]  [phone]  [facility]  [address]First floor), [place], [place] [address]  [phone]  [facility]  [address], [place], [place] [address]  [phone]  [facility]  [address], [place], [place] [address]  [phone]  [facility]  [address], [place], [place] [address]  [phone]  [facility]  [address], [place], [place] [address]  [phone]  [facility]  [address], [place], [place] [address]  [phone]  [facility]  [address], [place], [place] [address]  [phone]  [facility]   [address], [place], [place] [address]  [phone]  [facility]  [address], [place], [place] [address]   [phone]  [facility] [address], [place], [place] [address] [phone]  [facility]  [address], , [place] [address]  [phone]         

## 2023-04-05 NOTE — ED Triage Notes (Signed)
PER EMS: pt is from [place], pt reports nausea and vomiting, specifically in the morning, x 2 weeks. She took a home preg test and it was positive. LMP over a month ago. Emesis x4 today.   BP-136/88, HR-88, 18RR, 99% RA, CBG-242

## 2023-04-05 NOTE — ED Provider Triage Note (Signed)
Emergency Medicine Provider Triage Evaluation Note  Misty [name] , a 32 y.o. female  was evaluated in triage.  Pt complains of nausea vomiting with positive pregnancy test at home.  Patient states that she has not had a period in over a month but that today she has had 4 episodes of emesis that been nonbloody.  Patient states that she has taken 2 pregnancy tests at home that have been positive.  Patient notes that she typically becomes nauseous in the morning is unsure of other triggers.  Patient came via EMS to have another pregnancy test done and to get nausea meds.  Patient denies abdominal pain, chest pain, shortness of breath, dysuria, hematuria, vaginal bleeding, fevers  Review of Systems  Positive: See HPI Negative: See HPI  Physical Exam  BP 136/89 (BP Location: Left Arm)   Pulse 77   Temp 98.9 F (37.2 C)   Resp 18   Ht 5' 0.5" (1.537 m)   Wt 107 kg   LMP 02/10/2023 (Approximate)   SpO2 100%   Breastfeeding No   BMI 45.33 kg/m  Gen:   Awake, no distress   Resp:  Normal effort  MSK:   Moves extremities without difficulty  Other:    Medical Decision Making  Medically screening exam initiated at 6:44 PM.  Appropriate orders placed.  Misty [name] was informed that the remainder of the evaluation will be completed by another provider, this initial triage assessment does not replace that evaluation, and the importance of remaining in the ED until their evaluation is complete.  Workup initiated, patient stable at this time   Remi Deter 04/05/23 1845

## 2023-04-05 NOTE — Telephone Encounter (Signed)
Returned pt call yesterday 04/04/23 and made aware that she will need to [facility] to be [facility] because provider doesn't treat pregnant patients. PT states she understands. Pt then asked about her insulin if she still needs to take it because of the pregnancy. Spoke with Marcelino Duster and per Marcelino Duster she will have hold off until we hear from [facility] in regards to if she can still take or not. Made pt aware of provider response. Pt states she will call her pharmacy to find out if she can still take insulin or not.

## 2023-04-05 NOTE — Telephone Encounter (Signed)
Reached out to patient to give her the information I received from our clinical pharmacist. Patient didn't answer. Will send MyChart message with recommendation

## 2023-04-08 ENCOUNTER — Ambulatory Visit: Payer: Commercial Managed Care - HMO | Admitting: Pharmacist

## 2023-04-08 ENCOUNTER — Telehealth: Payer: Self-pay

## 2023-04-08 NOTE — Telephone Encounter (Signed)
Patient called to schedule a New OB follow up from MAU. She is already scheduled for an intake at [facility] and our office does not have an opening prior to her scheduled appointment. Informed patient of this and told to [facility] with [facility].

## 2023-04-09 ENCOUNTER — Ambulatory Visit: Payer: 59

## 2023-04-15 ENCOUNTER — Inpatient Hospital Stay (HOSPITAL_COMMUNITY): Payer: 59

## 2023-04-15 ENCOUNTER — Other Ambulatory Visit: Payer: Self-pay | Admitting: Family Medicine

## 2023-04-15 ENCOUNTER — Inpatient Hospital Stay (HOSPITAL_COMMUNITY)
Admission: AD | Admit: 2023-04-15 | Discharge: 2023-04-15 | Disposition: A | Discharge status: Home / Self Care | Payer: 59 | Attending: Obstetrics and Gynecology | Admitting: Obstetrics and Gynecology

## 2023-04-15 ENCOUNTER — Encounter (HOSPITAL_COMMUNITY): Payer: Self-pay | Admitting: Obstetrics & Gynecology

## 2023-04-15 DIAGNOSIS — R102 Pelvic and perineal pain: Secondary | ICD-10-CM | POA: Diagnosis present

## 2023-04-15 DIAGNOSIS — A5901 Trichomonal vulvovaginitis: Secondary | ICD-10-CM | POA: Diagnosis not present

## 2023-04-15 DIAGNOSIS — E1169 Type 2 diabetes mellitus with other specified complication: Secondary | ICD-10-CM

## 2023-04-15 DIAGNOSIS — Z3A09 9 weeks gestation of pregnancy: Secondary | ICD-10-CM | POA: Diagnosis not present

## 2023-04-15 DIAGNOSIS — O2341 Unspecified infection of urinary tract in pregnancy, first trimester: Secondary | ICD-10-CM | POA: Insufficient documentation

## 2023-04-15 DIAGNOSIS — N898 Other specified noninflammatory disorders of vagina: Secondary | ICD-10-CM | POA: Diagnosis present

## 2023-04-15 DIAGNOSIS — Z794 Long term (current) use of insulin: Secondary | ICD-10-CM | POA: Insufficient documentation

## 2023-04-15 DIAGNOSIS — N390 Urinary tract infection, site not specified: Secondary | ICD-10-CM | POA: Insufficient documentation

## 2023-04-15 DIAGNOSIS — O26891 Other specified pregnancy related conditions, first trimester: Secondary | ICD-10-CM

## 2023-04-15 DIAGNOSIS — O219 Vomiting of pregnancy, unspecified: Secondary | ICD-10-CM

## 2023-04-15 DIAGNOSIS — O10011 Pre-existing essential hypertension complicating pregnancy, first trimester: Secondary | ICD-10-CM | POA: Insufficient documentation

## 2023-04-15 DIAGNOSIS — O24111 Pre-existing diabetes mellitus, type 2, in pregnancy, first trimester: Secondary | ICD-10-CM | POA: Insufficient documentation

## 2023-04-15 DIAGNOSIS — O2340 Unspecified infection of urinary tract in pregnancy, unspecified trimester: Secondary | ICD-10-CM

## 2023-04-15 DIAGNOSIS — A599 Trichomoniasis, unspecified: Secondary | ICD-10-CM

## 2023-04-15 DIAGNOSIS — I10 Essential (primary) hypertension: Secondary | ICD-10-CM

## 2023-04-15 DIAGNOSIS — O208 Other hemorrhage in early pregnancy: Secondary | ICD-10-CM | POA: Diagnosis not present

## 2023-04-15 DIAGNOSIS — Z3A08 8 weeks gestation of pregnancy: Secondary | ICD-10-CM | POA: Diagnosis not present

## 2023-04-15 DIAGNOSIS — O23591 Infection of other part of genital tract in pregnancy, first trimester: Secondary | ICD-10-CM | POA: Diagnosis not present

## 2023-04-15 DIAGNOSIS — O24911 Unspecified diabetes mellitus in pregnancy, first trimester: Secondary | ICD-10-CM

## 2023-04-15 HISTORY — DX: Urinary tract infection, site not specified: N39.0

## 2023-04-15 LAB — CBC
HCT: 33.9 % — ABNORMAL LOW (ref 36.0–46.0)
Hemoglobin: 11.4 g/dL — ABNORMAL LOW (ref 12.0–15.0)
MCH: 30.1 pg (ref 26.0–34.0)
MCHC: 33.6 g/dL (ref 30.0–36.0)
MCV: 89.4 fL (ref 80.0–100.0)
Platelets: 329 10*3/uL (ref 150–400)
RBC: 3.79 MIL/uL — ABNORMAL LOW (ref 3.87–5.11)
RDW: 13.2 % (ref 11.5–15.5)
WBC: 5.5 10*3/uL (ref 4.0–10.5)
nRBC: 0 % (ref 0.0–0.2)

## 2023-04-15 LAB — URINALYSIS, ROUTINE W REFLEX MICROSCOPIC
Bilirubin Urine: NEGATIVE
Glucose, UA: NEGATIVE mg/dL
Hgb urine dipstick: NEGATIVE
Ketones, ur: NEGATIVE mg/dL
Nitrite: NEGATIVE
Protein, ur: 30 mg/dL — AB
Specific Gravity, Urine: 1.021 (ref 1.005–1.030)
WBC, UA: >50 WBC/hpf (ref 0–5)
pH: 5 (ref 5.0–8.0)

## 2023-04-15 LAB — PROTEIN / CREATININE RATIO, URINE
Creatinine, Urine: 299 mg/dL
Protein Creatinine Ratio: 0.09 mg/mg{Cre} (ref 0.00–0.15)
Total Protein, Urine: 26 mg/dL

## 2023-04-15 LAB — WET PREP, GENITAL
Clue Cells Wet Prep HPF POC: NONE SEEN
Sperm: NONE SEEN
WBC, Wet Prep HPF POC: >=10 — AB (ref <10)
Yeast Wet Prep HPF POC: NONE SEEN

## 2023-04-15 LAB — COMPREHENSIVE METABOLIC PANEL
ALT: 15 U/L (ref 0–44)
AST: 16 U/L (ref 15–41)
Albumin: 3.7 g/dL (ref 3.5–5.0)
Alkaline Phosphatase: 65 U/L (ref 38–126)
Anion gap: 10 (ref 5–15)
BUN: <5 mg/dL — ABNORMAL LOW (ref 6–20)
CO2: 22 mmol/L (ref 22–32)
Calcium: 9.1 mg/dL (ref 8.9–10.3)
Chloride: 103 mmol/L (ref 98–111)
Creatinine, Ser: 0.67 mg/dL (ref 0.44–1.00)
GFR, Estimated: >60 mL/min (ref >60)
Glucose, Bld: 127 mg/dL — ABNORMAL HIGH (ref 70–99)
Potassium: 3.8 mmol/L (ref 3.5–5.1)
Sodium: 135 mmol/L (ref 135–145)
Total Bilirubin: 0.4 mg/dL (ref 0.3–1.2)
Total Protein: 7.2 g/dL (ref 6.5–8.1)

## 2023-04-15 LAB — HEMOGLOBIN A1C
Hgb A1c MFr Bld: 7.9 % — ABNORMAL HIGH (ref 4.8–5.6)
Mean Plasma Glucose: 180 mg/dL

## 2023-04-15 MED ORDER — METRONIDAZOLE 500 MG PO TABS: 500.0000 mg | ORAL_TABLET | Freq: Two times a day (BID) | ORAL | 0 refills | Status: DC

## 2023-04-15 MED ORDER — NIFEDIPINE ER OSMOTIC RELEASE 30 MG PO TB24
60.0000 mg | ORAL_TABLET | Freq: Every day | ORAL | Status: DC
  Administered 2023-04-15: 60 mg via ORAL
  Filled 2023-04-15: qty 2

## 2023-04-15 MED ORDER — CEPHALEXIN 500 MG PO CAPS: 500.0000 mg | ORAL_CAPSULE | Freq: Four times a day (QID) | ORAL | 0 refills | Status: DC

## 2023-04-15 MED ORDER — BLOOD GLUCOSE TEST VI STRP: 1.0000 | ORAL_STRIP | Freq: Three times a day (TID) | 0 refills | Status: DC

## 2023-04-15 MED ORDER — ONDANSETRON HCL 4 MG PO TABS: 4.0000 mg | ORAL_TABLET | Freq: Four times a day (QID) | ORAL | 0 refills | Status: DC

## 2023-04-15 MED ORDER — BLOOD PRESSURE MONITOR AUTOMAT DEVI: 1.0000 | Freq: Every day | 0 refills | Status: DC

## 2023-04-15 MED ORDER — ONDANSETRON HCL 4 MG/2ML IJ SOLN
4.0000 mg | Freq: Once | INTRAMUSCULAR | Status: AC
  Administered 2023-04-15: 4 mg via INTRAVENOUS
  Filled 2023-04-15: qty 2

## 2023-04-15 MED ORDER — LABETALOL HCL 100 MG PO TABS
100.0000 mg | ORAL_TABLET | Freq: Once | ORAL | Status: AC
  Administered 2023-04-15: 100 mg via ORAL
  Filled 2023-04-15: qty 1

## 2023-04-15 MED ORDER — BLOOD GLUCOSE MONITORING SUPPL DEVI: 1.0000 | Freq: Three times a day (TID) | 0 refills | Status: DC

## 2023-04-15 MED ORDER — DOXYLAMINE-PYRIDOXINE 10-10 MG PO TBEC: 1.0000 | DELAYED_RELEASE_TABLET | Freq: Three times a day (TID) | ORAL | 0 refills | Status: DC

## 2023-04-15 MED ORDER — BLOOD GLUCOSE TEST VI STRP
1.0000 | ORAL_STRIP | Freq: Three times a day (TID) | 0 refills | Status: AC
Start: 2023-04-15 — End: 2023-05-15

## 2023-04-15 MED ORDER — LANCET DEVICE MISC: 1.0000 | Freq: Three times a day (TID) | 0 refills | Status: AC

## 2023-04-15 MED ORDER — FOSFOMYCIN TROMETHAMINE 3 G PO PACK: 3.0000 g | PACK | Freq: Once | ORAL | 0 refills | Status: AC

## 2023-04-15 MED ORDER — LANCETS MISC. MISC: 1.0000 | Freq: Three times a day (TID) | 0 refills | Status: AC

## 2023-04-15 MED ORDER — LACTATED RINGERS IV BOLUS
500.0000 mL | Freq: Once | INTRAVENOUS | Status: AC
  Administered 2023-04-15: 500 mL via INTRAVENOUS

## 2023-04-15 NOTE — MAU Provider Note (Cosign Needed Addendum)
History     CSN: [id number]  Arrival date and time: 04/15/23 0736   Event Date/Time   First Provider Initiated Contact with Patient 04/15/23 0919      Chief Complaint  Patient presents with   Vaginal Discharge   HPI: ANIFER ZENTGRAF is a 32 y.o. [id number] at [redacted]w[redacted]d who presents for vaginal discharge.   Vaginal discharge:  Patient was seen on 8/2 for vaginal discharge, diagnosed with trichomoniasis and treated with 2g of Flagyl at the time. She reports ongoing vaginal discharge - it is clear when she wipes and green earlier today when she collected her self swab. She denies having intercourse since her  last visit at MAU and also discussed the diagnosis with her partner who is also getting treated. She denies any fevers or chills.   Pelvic pain: Ongoing cramping that she likens to a period. No bleeding noted. She has not taken any meds for the pain. Describes it as an 8/10.  DM: Prev followed by PCP for DM, unsure of last A1c. Taking Basaglar 18 bid, does not check BG bec doesn't have glucometer. Interested in [place]. No sxs lows.  HTN: Taking Procardia and Labetalol. Compliant w meds. No headaches, CP, palp, SOB, edema. Reports having pre-e with last pregnancy.  Nausea/vomiting: Ongoing for weeks. Able to keep some water down, but not much else. Urinated 3x last night.  Past Medical History:  Diagnosis Date   Abscess 09/02/2020   Allergy    Anemia 2015   Asthma    as child   Chronic hypertension with superimposed preeclampsia 05/24/2021   Diabetes mellitus without complication (HCC) 2015   DKA (diabetic ketoacidosis) (HCC) 09/02/2020   Eczema    Hypertension 09/2013   Trichomonas infection 04/2023   UTI (urinary tract infection)     Past Surgical History:  Procedure Laterality Date   Abcess on back     NO PAST SURGERIES      Family History  Problem Relation Age of Onset   Migraines Mother    Hypertension Mother    Heart disease Mother 56       CHF   Diabetes  Mother    Healthy Father    Sickle cell anemia Brother    Migraines Maternal Grandmother    Diabetes Maternal Grandmother    Heart disease Maternal Grandmother    Hypertension Maternal Grandfather    Diabetes Maternal Grandfather    Heart disease Maternal Grandfather     Social History   Tobacco Use   Smoking status: Former    Current packs/day: 0.00    Average packs/day: 0.5 packs/day for 2.0 years (1.0 ttl pk-yrs)    Types: Cigarettes    Start date: 09/09/2011    Quit date: 09/08/2013    Years since quitting: 9.6   Smokeless tobacco: Never  Vaping Use   Vaping status: Never Used  Substance Use Topics   Alcohol use: No    Alcohol/week: 0.0 standard drinks of alcohol   Drug use: No    Allergies:  Allergies  Allergen Reactions   Iodides Anaphylaxis and Itching   Morphine And Codeine Anaphylaxis and Itching   Shellfish Allergy Anaphylaxis and Itching    Pt reports "watery eyes"   Ultram [Tramadol Hcl] Hives    Hives and swollen lips     Medications Prior to Admission  Medication Sig Dispense Refill Last Dose   labetalol (NORMODYNE) 100 MG tablet TAKE 1 TABLET BY MOUTH 2 TIMES DAILY 180 tablet 1 04/14/2023 at  1900   Prenatal Vit-Fe Fumarate-FA (PRENATAL VITAMIN) 27-0.8 MG TABS Take 1 tablet by mouth daily. 30 tablet 11 04/14/2023    Review of Systems  Constitutional:  Negative for chills and fever.  HENT:  Negative for congestion.   Respiratory:  Negative for chest tightness and shortness of breath.   Cardiovascular:  Negative for chest pain, palpitations and leg swelling.  Gastrointestinal:  Positive for nausea and vomiting. Negative for constipation and diarrhea.  Genitourinary:  Positive for pelvic pain. Negative for dysuria, urgency, vaginal bleeding and vaginal pain.  Musculoskeletal:  Negative for back pain.  Skin:  Negative for rash.  Neurological:  Negative for light-headedness and headaches.   Physical Exam   Blood pressure (!) 140/83, pulse 65, temperature  98.9 F (37.2 C), temperature source Oral, resp. rate 16, last menstrual period 02/10/2023, SpO2 100%, not currently breastfeeding.  Physical Exam Constitutional:      Appearance: Normal appearance.  HENT:     Head: Normocephalic and atraumatic.  Cardiovascular:     Rate and Rhythm: Normal rate and regular rhythm.     Heart sounds: Normal heart sounds.  Pulmonary:     Effort: Pulmonary effort is normal.     Breath sounds: Normal breath sounds.  Abdominal:     General: Abdomen is flat.     Palpations: Abdomen is soft.  Skin:    General: Skin is warm and dry.  Neurological:     General: No focal deficit present.     Mental Status: She is alert and oriented to person, place, and time.  Psychiatric:        Mood and Affect: Mood normal.        Behavior: Behavior normal.     MAU Course  Procedures  MDM 9:48 AM  Patient here with vaginal discharge -- notably green in color with swab today. S/p Flagyl 2000mg  once at time of last visit. No intercourse since. Will check swabs again today. Also checking UA for evidence of UTI. BP elevated here -- due for AM meds, will provide home meds at this time. Obtaining baseline PIH labs. Also without glucometer, BP monitor -- will order for pt and refer to DM education. Pelvic pain -- unclear etiology, no VB. Will obtain U/S.   10:01 AM  Wet prep pos for Trichomoniasis -- will tx with course of Flagyl. UA suspicious for UTI, will tx with Fosfomycin, Ucx ordered. Awaiting blood work, U/S.  11:03 AM  U/S - SIUP [redacted]w[redacted]d, small subchorionic hemorrhage anteriorly - unchanged. CBC ok. A1c pending. BL PCR 0.09. Awaiting CMP. Pt reports pain improved. Requesting anti-emetic for nausea. Zofran ordered.  11:45 AM  BP better, nausea better. CMP ok. D/w pt return precautions. Pt discharged.   Assessment and Plan  Trichomoniasis Tx with course of Flagyl  Urinary tract infection affecting pregnancy Tx with Fosfomycin  intially Rx'ed Keflex, but called  pharmacy to clarify abx plan -- pharmacy to fill Fosfomycin  Diabetes mellitus affecting pregnancy in first trimester Cont current insulin regimen  glucometer Rx'ed  referral to DM coordinator sent  Chronic hypertension BP improved with home Labetalol and Procardia  cont to monitor  BP cuff sent  Pelvic pain in pregnancy, antepartum, first trimester U/S reassuring  no VB  VSS  return precautions d/w pt  Nausea and vomiting in pregnancy Diclegis and Zofran sent   Subchorionic hemorrhage in first trimester On U/S -- unchanged  Patient discussed with Raelyn Mora, CNM  Sundra Aland, MD OB Fellow, Faculty Practice  [place] [employer]

## 2023-04-15 NOTE — MAU Note (Addendum)
DEVANNE BENOY is a 32 y.o. at [redacted]w[redacted]d here in MAU reporting: pt arrived by EMS, with complaints are clear vag d/c- ongoing last 2-3 days. Was treated for trich the last time she was here (8/2) has not had intercourse since then. Denies vag irritation or itching. No bleeding.  Having cramping in lower abd, ongoing problem.  Denies pain with urination, no diarrhea or constipation.  Onset of complaint: 2-3 days ago Pain score: 8 Vitals:   04/15/23 0746  BP: (!) 157/83  Pulse: 63  Resp: 16  Temp: 98.9 F (37.2 C)  SpO2: 100%      Lab orders placed from triage:    Vs reported by ems 160/102, cbg: 140 (no meds, does not follow low carb diet, does not have a meter) Pt has not taken BP meds yet today

## 2023-04-15 NOTE — MAU Note (Signed)
Called lab, still listed as needs to be collected.. tube system still not working, ?"Spec caught in the system", provider notified.

## 2023-04-17 NOTE — Progress Notes (Signed)
A1c still not at goal -- pt set up with DM education. Will send message to get pt in for DM f/up sooner than current appt.

## 2023-04-18 DIAGNOSIS — A599 Trichomoniasis, unspecified: Secondary | ICD-10-CM

## 2023-04-19 ENCOUNTER — Telehealth: Payer: Self-pay | Admitting: *Deleted

## 2023-04-19 NOTE — Progress Notes (Signed)
Patient with pre-existing DM, needs to be set up with glucometer/supplies for monitoring. She is currently on insulin, not monitoring blood sugars. Sent a monitor from MAU. Would you mind calling and setting her up with teaching as well?  Thanks!  AK

## 2023-04-19 NOTE — Telephone Encounter (Signed)
I called patient and she states she has her meter and is checking her blood sugars and it is better than it was - says not high to her. States her fastings are about 132-140 and after meals are one something not 2 something like before.  I informed her we wanted to move her diabetic education appt up if possible, I offered and she accepted appt with educator in our office for first available 05/02/23.  She also wants intake visit to be in person so she can hear her baby's heart rate. No new ob scheduled yet, I explained I will send message to schedule asap after in person  intake . She also states no further bleeding since MAU visit. I reviewed bleeding precautions with her. She voices understanding. Nancy Fetter

## 2023-04-19 NOTE — Telephone Encounter (Signed)
-----   Message from Sundra Aland sent at 04/19/2023  9:29 AM EDT ----- Patient with pre-existing DM, needs to be set up with glucometer/supplies for monitoring. She is currently on insulin, not monitoring blood sugars. Sent a monitor from MAU. Would you mind calling and setting her up with teaching as well?  Thanks!  AK

## 2023-04-30 ENCOUNTER — Inpatient Hospital Stay (HOSPITAL_COMMUNITY)
Admission: AD | Admit: 2023-04-30 | Discharge: 2023-04-30 | Disposition: A | Discharge status: Home / Self Care | Payer: 59 | Attending: Obstetrics & Gynecology | Admitting: Obstetrics & Gynecology

## 2023-04-30 ENCOUNTER — Encounter (HOSPITAL_COMMUNITY): Payer: Self-pay | Admitting: Obstetrics & Gynecology

## 2023-04-30 DIAGNOSIS — Z3A11 11 weeks gestation of pregnancy: Secondary | ICD-10-CM | POA: Diagnosis not present

## 2023-04-30 DIAGNOSIS — R059 Cough, unspecified: Secondary | ICD-10-CM | POA: Diagnosis present

## 2023-04-30 DIAGNOSIS — O26891 Other specified pregnancy related conditions, first trimester: Secondary | ICD-10-CM | POA: Diagnosis not present

## 2023-04-30 DIAGNOSIS — N898 Other specified noninflammatory disorders of vagina: Secondary | ICD-10-CM | POA: Diagnosis present

## 2023-04-30 DIAGNOSIS — O24111 Pre-existing diabetes mellitus, type 2, in pregnancy, first trimester: Secondary | ICD-10-CM | POA: Insufficient documentation

## 2023-04-30 DIAGNOSIS — Z87891 Personal history of nicotine dependence: Secondary | ICD-10-CM | POA: Diagnosis not present

## 2023-04-30 DIAGNOSIS — R1084 Generalized abdominal pain: Secondary | ICD-10-CM | POA: Diagnosis present

## 2023-04-30 DIAGNOSIS — R109 Unspecified abdominal pain: Secondary | ICD-10-CM | POA: Diagnosis not present

## 2023-04-30 LAB — URINALYSIS, ROUTINE W REFLEX MICROSCOPIC
Bilirubin Urine: NEGATIVE
Glucose, UA: NEGATIVE mg/dL
Hgb urine dipstick: NEGATIVE
Ketones, ur: NEGATIVE mg/dL
Nitrite: NEGATIVE
Protein, ur: NEGATIVE mg/dL
Specific Gravity, Urine: 1.019 (ref 1.005–1.030)
pH: 5 (ref 5.0–8.0)

## 2023-04-30 MED ORDER — ONDANSETRON 4 MG PO TBDP
4.0000 mg | ORAL_TABLET | Freq: Three times a day (TID) | ORAL | 0 refills | Status: DC | PRN
Start: 2023-04-30 — End: 2023-05-21

## 2023-04-30 MED ORDER — ACETAMINOPHEN 500 MG PO TABS
500.0000 mg | ORAL_TABLET | Freq: Four times a day (QID) | ORAL | 0 refills | Status: DC | PRN
Start: 2023-04-30 — End: 2023-07-09

## 2023-04-30 MED ORDER — CYCLOBENZAPRINE HCL 10 MG PO TABS: 10.0000 mg | ORAL_TABLET | Freq: Two times a day (BID) | ORAL | 0 refills | Status: DC | PRN

## 2023-04-30 NOTE — MAU Provider Note (Signed)
History     CSN: [id number]  Arrival date and time: 04/30/23 4098   Event Date/Time   First Provider Initiated Contact with Patient 04/30/23 1028      Chief Complaint  Patient presents with   Vaginal Discharge   HPI [name] is a 32 y.o. year old [id number] female at [redacted]w[redacted]d weeks gestation who presents to MAU reporting occasional abdominal "tightness" when she coughed 3 times yesterday; none today. She states, "I didn't have any of this when I was pregnant with my daughter, so I just want to make sure everything is ok/normal." She was recently tx'd for (+) Trich and UTI. She denies any recent SI. She denies ay VB or LOF. She reports needing a refill on Zofran Rx. She receives [facility] with [facility]; next appt is 05/01/2023.   OB History     Gravida  4   Para  1   Term  0   Preterm  1   AB  2   Living  1      SAB  2   IAB  0   Ectopic  0   Multiple  0   Live Births  1           Past Medical History:  Diagnosis Date   Abscess 09/02/2020   Allergy    Anemia 2015   Asthma    as child   Chronic hypertension with superimposed preeclampsia 05/24/2021   Diabetes mellitus without complication (HCC) 2015   DKA (diabetic ketoacidosis) (HCC) 09/02/2020   Eczema    Hypertension 09/2013   Trichomonas infection 04/2023   UTI (urinary tract infection)     Past Surgical History:  Procedure Laterality Date   Abcess on back     NO PAST SURGERIES      Family History  Problem Relation Age of Onset   Migraines Mother    Hypertension Mother    Heart disease Mother 20       CHF   Diabetes Mother    Healthy Father    Sickle cell anemia Brother    Migraines Maternal Grandmother    Diabetes Maternal Grandmother    Heart disease Maternal Grandmother    Hypertension Maternal Grandfather    Diabetes Maternal Grandfather    Heart disease Maternal Grandfather     Social History   Tobacco Use   Smoking status: Former    Current packs/day: 0.00    Average  packs/day: 0.5 packs/day for 2.0 years (1.0 ttl pk-yrs)    Types: Cigarettes    Start date: 09/09/2011    Quit date: 09/08/2013    Years since quitting: 9.6   Smokeless tobacco: Never  Vaping Use   Vaping status: Never Used  Substance Use Topics   Alcohol use: No    Alcohol/week: 0.0 standard drinks of alcohol   Drug use: No    Allergies:  Allergies  Allergen Reactions   Iodides Anaphylaxis and Itching   Morphine And Codeine Anaphylaxis and Itching   Shellfish Allergy Anaphylaxis and Itching    Pt reports "watery eyes"   Ultram [Tramadol Hcl] Hives    Hives and swollen lips     No medications prior to admission.    Review of Systems  Constitutional: Negative.   HENT: Negative.    Eyes: Negative.   Respiratory: Negative.    Cardiovascular: Negative.   Gastrointestinal: Negative.   Endocrine: Negative.   Genitourinary:  Positive for pelvic pain (abd tightness).  Musculoskeletal: Negative.  Skin: Negative.   Allergic/Immunologic: Negative.   Neurological: Negative.   Hematological: Negative.   Psychiatric/Behavioral: Negative.     Physical Exam   Blood pressure 137/77, pulse 71, temperature 98.4 F (36.9 C), temperature source Oral, resp. rate 16, height 5' 3.5" (1.613 m), weight 106.3 kg, last menstrual period 02/10/2023, SpO2 100%, not currently breastfeeding.  Physical Exam Vitals and nursing note reviewed.  Constitutional:      Appearance: Normal appearance. She is obese.  Cardiovascular:     Rate and Rhythm: Normal rate.  Pulmonary:     Effort: Pulmonary effort is normal.  Genitourinary:    Comments: Deferred - informed that TOC would be done in the office in 4 wks Musculoskeletal:        General: Normal range of motion.  Skin:    General: Skin is warm and dry.  Neurological:     Mental Status: She is alert and oriented to person, place, and time.  Psychiatric:        Mood and Affect: Mood normal.        Behavior: Behavior normal.        Thought  Content: Thought content normal.        Judgment: Judgment normal.    FHTs by doppler: 172 bpm  MAU Course  Procedures  MDM CCUA  Results for orders placed or performed during the hospital encounter of 04/30/23 (from the past 24 hour(s))  Urinalysis, Routine w reflex microscopic -Urine, Clean Catch     Status: Abnormal   Collection Time: 04/30/23 10:49 AM  Result Value Ref Range   Color, Urine YELLOW YELLOW   APPearance HAZY (A) CLEAR   Specific Gravity, Urine 1.019 1.005 - 1.030   pH 5.0 5.0 - 8.0   Glucose, UA NEGATIVE NEGATIVE mg/dL   Hgb urine dipstick NEGATIVE NEGATIVE   Bilirubin Urine NEGATIVE NEGATIVE   Ketones, ur NEGATIVE NEGATIVE mg/dL   Protein, ur NEGATIVE NEGATIVE mg/dL   Nitrite NEGATIVE NEGATIVE   Leukocytes,Ua LARGE (A) NEGATIVE   RBC / HPF 0-5 0 - 5 RBC/hpf   WBC, UA 6-10 0 - 5 WBC/hpf   Bacteria, UA RARE (A) NONE SEEN   Squamous Epithelial / HPF 0-5 0 - 5 /HPF   Mucus PRESENT     Assessment and Plan  1. Abdominal pain during pregnancy in first trimester - Information provided on abdominal pain in pregnancy, RLP and comfort measures to relieve RLP - Rx: Tylenol 500 mg 1-2 tabs every 8 hrs po prn pain   2. [redacted] weeks gestation of pregnancy - Reassurance given that her CCUA today was more than likely showing that she didn't clean herself that well before collecting the urine. A TOC would be done 4 wks after her UTI tx. - Rx refill sent for: Zofran 4 gm ODT  - Discharge patient - Keep appt tomorrow with [facility] - Patient verbalized an understanding of the plan of care and agrees.  Raelyn Mora, CNM 04/30/2023, 10:28 AM

## 2023-04-30 NOTE — MAU Note (Addendum)
.  Misty [name] is a 32 y.o. at [redacted]w[redacted]d here in MAU reporting: Occasional abdominal tightness yesterday when she coughed "three times." Denies tightness today. She reports she completed her medications for Trich and reports her vaginal discharge is now clear and yellow. Denies VB or LOF.   First OB appointment tomorrow with [facility] on [address].  Patient dx on 8/2 with trichomoniasis and treated with 2g of Flagyl. Patent presented to MAU on 8/12 with ongoing vaginal discharge and Wet Prep showed positive for Trichomoniasis and treated with another course of Flagyl. Patient was also suspicious for UTI and was sent with a rx for Fosfomycin.  Onset of complaint: Ongoing Pain score: Denies pain.  FHT: 172 doppler Lab orders placed from triage: UA

## 2023-04-30 NOTE — Discharge Instructions (Addendum)
Comfort Measures for Round Ligament Pain:  Soak in a warm tub Tylenol 1000 mg by mouth every 6-8 hrs as needed for pain OR Flexeril as prescribed, if Tylenol is not helping Slow position changes Laying on the affected side

## 2023-05-01 ENCOUNTER — Other Ambulatory Visit: Payer: Self-pay

## 2023-05-01 ENCOUNTER — Other Ambulatory Visit (HOSPITAL_COMMUNITY)
Admission: RE | Admit: 2023-05-01 | Discharge: 2023-05-01 | Disposition: A | Discharge status: Home / Self Care | Payer: 59 | Source: Ambulatory Visit | Attending: Family Medicine | Admitting: Family Medicine

## 2023-05-01 ENCOUNTER — Ambulatory Visit (INDEPENDENT_AMBULATORY_CARE_PROVIDER_SITE_OTHER): Payer: 59

## 2023-05-01 VITALS — BP 149/86 | HR 68 | Wt 232.8 lb

## 2023-05-01 DIAGNOSIS — O0990 Supervision of high risk pregnancy, unspecified, unspecified trimester: Secondary | ICD-10-CM | POA: Insufficient documentation

## 2023-05-01 DIAGNOSIS — Z3A11 11 weeks gestation of pregnancy: Secondary | ICD-10-CM

## 2023-05-01 DIAGNOSIS — O0991 Supervision of high risk pregnancy, unspecified, first trimester: Secondary | ICD-10-CM

## 2023-05-01 NOTE — Progress Notes (Signed)
New OB Intake  I connected with Misty [name]  on 05/01/23 at 10:15 AM EDT by In Person Visit and verified that I am speaking with the correct person using two identifiers. Nurse is located at [facility] and pt is located at [facility].  I discussed the limitations, risks, security and privacy concerns of performing an evaluation and management service by telephone and the availability of in person appointments. I also discussed with the patient that there may be a patient responsible charge related to this service. The patient expressed understanding and agreed to proceed.  I explained I am completing New OB Intake today. We discussed EDD of 11/17/2023, by Last Menstrual Period. Pt is [name]. I reviewed her allergies, medications and Medical/Surgical/OB history.    Patient Active Problem List   Diagnosis Date Noted   Trichomoniasis 04/15/2023   Diabetes mellitus affecting pregnancy in first trimester 04/15/2023   Nausea and vomiting in pregnancy 04/15/2023   Subchorionic hemorrhage in first trimester 04/15/2023   Type 2 diabetes mellitus (HCC) 01/08/2023   Chronic hypertension 06/17/2021   Marijuana use 05/24/2021    Concerns addressed today  Delivery Plans Plans to deliver at [facility]. Discussed the nature of our practice with multiple providers including residents and students. Due to the size of the practice, the delivering provider may not be the same as those providing prenatal care.   Patient is not interested in water birth. Offered upcoming OB visit with CNM to discuss further.  MyChart/Babyscripts MyChart access verified. I explained pt will have some visits in office and some virtually. Babyscripts instructions given and order placed. Patient verifies receipt of registration text/e-mail. Account successfully created and app downloaded.  Blood Pressure Cuff/Weight Scale Blood pressure cuff ordered for patient to pick-up from [employer]. Explained after first prenatal appt  pt will check weekly and document in Babyscripts. Patient does have weight scale.  Anatomy US Explained first scheduled [place] will be around 19 weeks. Anatomy US scheduled for 06/24/23 at 0115p.  Is patient a CenteringPregnancy candidate?    Not a candidate due to [facility], medication controlled If accepted,    Is patient a Mom+Baby Combined Care candidate?  Not a candidate   If accepted, confirm patient does not intend to move from the area for at least 12 months, then notify Mom+Baby staff  Interested in [place]? If yes, send referral and doula dot phrase.   Is patient a candidate for Babyscripts Optimization? No - High Risk  First visit review I reviewed new OB appt with patient. Explained pt will be seen by Dr.Pickens at first visit. Discussed [name] genetic screening with patient. Done Panorama and Horizon.. Routine prenatal labs  collected today.    Last Pap Diagnosis  Date Value Ref Range Status  02/15/2021   Final   - Negative for intraepithelial lesion or malignancy (NILM)    [name], CMA 05/01/2023  10:02 AM

## 2023-05-02 ENCOUNTER — Ambulatory Visit (INDEPENDENT_AMBULATORY_CARE_PROVIDER_SITE_OTHER): Payer: 59 | Admitting: Skilled Nursing Facility1

## 2023-05-02 ENCOUNTER — Encounter: Payer: Self-pay | Admitting: Skilled Nursing Facility1

## 2023-05-02 VITALS — Ht 63.5 in | Wt 233.2 lb

## 2023-05-02 DIAGNOSIS — Z3A11 11 weeks gestation of pregnancy: Secondary | ICD-10-CM

## 2023-05-02 DIAGNOSIS — O24911 Unspecified diabetes mellitus in pregnancy, first trimester: Secondary | ICD-10-CM

## 2023-05-02 LAB — COMPREHENSIVE METABOLIC PANEL
ALT: 19 IU/L (ref 0–32)
AST: 18 IU/L (ref 0–40)
Albumin: 4.3 g/dL (ref 3.9–4.9)
Alkaline Phosphatase: 71 IU/L (ref 44–121)
BUN/Creatinine Ratio: 10 (ref 9–23)
BUN: 7 mg/dL (ref 6–20)
Bilirubin Total: 0.3 mg/dL (ref 0.0–1.2)
CO2: 20 mmol/L (ref 20–29)
Calcium: 9.2 mg/dL (ref 8.7–10.2)
Chloride: 100 mmol/L (ref 96–106)
Creatinine, Ser: 0.7 mg/dL (ref 0.57–1.00)
Globulin, Total: 2.5 g/dL (ref 1.5–4.5)
Glucose: 127 mg/dL — ABNORMAL HIGH (ref 70–99)
Potassium: 3.7 mmol/L (ref 3.5–5.2)
Sodium: 135 mmol/L (ref 134–144)
Total Protein: 6.8 g/dL (ref 6.0–8.5)
eGFR: 118 mL/min/{1.73_m2} (ref >59)

## 2023-05-02 LAB — CBC/D/PLT+RPR+RH+ABO+RUBIGG...
Antibody Screen: NEGATIVE
Basophils Absolute: 0 10*3/uL (ref 0.0–0.2)
Basos: 0 %
EOS (ABSOLUTE): 0.1 10*3/uL (ref 0.0–0.4)
Eos: 2 %
HCV Ab: NONREACTIVE
HIV Screen 4th Generation wRfx: NONREACTIVE
Hematocrit: 31.5 % — ABNORMAL LOW (ref 34.0–46.6)
Hemoglobin: 10.6 g/dL — ABNORMAL LOW (ref 11.1–15.9)
Hepatitis B Surface Ag: NEGATIVE
Immature Grans (Abs): 0 10*3/uL (ref 0.0–0.1)
Immature Granulocytes: 0 %
Lymphocytes Absolute: 1.5 10*3/uL (ref 0.7–3.1)
Lymphs: 25 %
MCH: 30.4 pg (ref 26.6–33.0)
MCHC: 33.7 g/dL (ref 31.5–35.7)
MCV: 90 fL (ref 79–97)
Monocytes Absolute: 0.4 10*3/uL (ref 0.1–0.9)
Monocytes: 7 %
Neutrophils Absolute: 3.9 10*3/uL (ref 1.4–7.0)
Neutrophils: 66 %
Platelets: 343 10*3/uL (ref 150–450)
RBC: 3.49 x10E6/uL — ABNORMAL LOW (ref 3.77–5.28)
RDW: 13.2 % (ref 11.7–15.4)
RPR Ser Ql: NONREACTIVE
Rh Factor: POSITIVE
Rubella Antibodies, IGG: 1.05 {index} (ref >0.99)
WBC: 5.9 10*3/uL (ref 3.4–10.8)

## 2023-05-02 LAB — HEMOGLOBIN A1C
Est. average glucose Bld gHb Est-mCnc: 174 mg/dL
Hgb A1c MFr Bld: 7.7 % — ABNORMAL HIGH (ref 4.8–5.6)

## 2023-05-02 LAB — PROTEIN / CREATININE RATIO, URINE
Creatinine, Urine: 253.9 mg/dL
Protein, Ur: 14.9 mg/dL
Protein/Creat Ratio: 59 mg/g{creat} (ref 0–200)

## 2023-05-02 LAB — TSH: TSH: 0.932 u[IU]/mL (ref 0.450–4.500)

## 2023-05-02 LAB — HCV INTERPRETATION

## 2023-05-02 NOTE — Progress Notes (Signed)
Patient was seen for Gestational Diabetes self-management on 05/02/2023  Start time 3:10 and End time 4:10   Estimated due date: 11 weeks 4d  Clinical: Medications: basaglar: 7-8am and 9-10pm 18 units 2 times a day TDD 36 (found a note in chart to increase 20 units 2 times a day that the pt missed)  Medical History: preexisting DM Labs:  Lab Results  Component Value Date   HGBA1C 7.9 (H) 04/15/2023     Dietary and Lifestyle History:  Pt states she checks her blood sugar 3 times a day: sometimes eating in the middle of the night getting 140 in the morning unable to recall her other numbers.  Pt states she has a hard time falling asleep at night.  Pt states she struggles with constipation.  Pt states she would like to do a CGM.   Pt states she remembers the education from her first pregnancy. Pt states she feels she needs to take her insulin 3 times a day: dietitian advised her doctor did advise she increase her insulin units but she missed that message in her MyChart so to try that first and her doctor will titrate as neccessary.   Physical Activity: ADL's Stress: controlled Sleep: not great  24 hr Recall:  First Meal: skipped Snack: Second meal 2:30: salad: baby spinach with ranch Snack:  Third meal: meatloaf and green beans and rice  Snack: in the middle of the night fruit or oatmeal cake Beverages: Gatorade zero, water  NUTRITION INTERVENTION  Nutrition education (E-1) on the following topics:   Initial Follow-up  []  [x]  Definition of Gestational Diabetes []  [x]  Why dietary management is important in controlling blood glucose []  [x]  Effects each nutrient has on blood glucose levels []  [x]  Simple carbohydrates vs complex carbohydrates []  [x]  Fluid intake []  [x]  Creating a balanced meal plan []  [x]  Carbohydrate counting  []  [x]  When to check blood glucose levels []  [x]  Proper blood glucose monitoring techniques []  [x]  Effect of stress and stress reduction techniques   []  [x]  Exercise effect on blood glucose levels, appropriate exercise during pregnancy []  [x]  Importance of limiting caffeine and abstaining from alcohol and smoking []  [x]  Medications used for blood sugar control during pregnancy []  [x]  Hypoglycemia and rule of 15 []  [x]  Postpartum self care  Patient instructed to monitor glucose levels: FBS: 60 - ? 95 mg/dL; 2 hour: ? 782 mg/dL  Patient received handouts: Nutrition Diabetes and Pregnancy Carbohydrate Counting List  Patient will be seen for follow-up as needed.

## 2023-05-03 ENCOUNTER — Encounter: Payer: Self-pay | Admitting: Obstetrics and Gynecology

## 2023-05-03 ENCOUNTER — Telehealth: Payer: Self-pay | Admitting: Lactation Services

## 2023-05-03 LAB — CULTURE, OB URINE

## 2023-05-03 LAB — URINE CULTURE, OB REFLEX

## 2023-05-03 NOTE — Telephone Encounter (Signed)
Azell Der (Key: [id number]) PA Case ID #: [id number] Need Help? Call us at [phone] Status Sent to Plan today Drug Doxylamine-Pyridoxine 10-10MG  dr tablets Form [facility] Electronic Prior Authorization Form [phone] NCPDP)

## 2023-05-07 NOTE — Telephone Encounter (Signed)
Misty [name] (Key: [id number]) PA Case ID #: [id number] Need Help? Call us at [phone] Outcome Approved on August 30 by [facility] 2017 NCPDP Request Reference Number: [id number]. DOXYL/PYRID TAB 10-10MG  is approved through 05/02/2024. Your patient may now fill this prescription and it will be covered. Authorization Expiration Date: 05/02/2024 Drug Doxylamine-Pyridoxine 10-10MG  dr tablets ePA cloud logo Form [facility] Electronic Prior Authorization Form (2017 NCPDP)  Called and LM at [facility] that Diclegis has been approved.   Called patient and informed her that Diclegis is approved and that Pharmacy was notified. Patient voiced understanding.

## 2023-05-09 LAB — CERVICOVAGINAL ANCILLARY ONLY
Chlamydia: NEGATIVE
Comment: NEGATIVE
Comment: NEGATIVE
Comment: NORMAL
Neisseria Gonorrhea: NEGATIVE
Trichomonas: POSITIVE — AB

## 2023-05-10 ENCOUNTER — Ambulatory Visit: Payer: Commercial Managed Care - HMO | Admitting: Pharmacist

## 2023-05-10 ENCOUNTER — Encounter: Payer: 59 | Admitting: Obstetrics and Gynecology

## 2023-05-10 MED ORDER — METRONIDAZOLE 500 MG PO TABS
500.0000 mg | ORAL_TABLET | Freq: Two times a day (BID) | ORAL | 0 refills | Status: DC
Start: 2023-05-10 — End: 2023-05-21

## 2023-05-11 ENCOUNTER — Other Ambulatory Visit (INDEPENDENT_AMBULATORY_CARE_PROVIDER_SITE_OTHER): Payer: Self-pay | Admitting: Primary Care

## 2023-05-11 LAB — HORIZON CUSTOM: REPORT SUMMARY: NEGATIVE

## 2023-05-12 LAB — PANORAMA PRENATAL TEST FULL PANEL:PANORAMA TEST PLUS 5 ADDITIONAL MICRODELETIONS: FETAL FRACTION: 9.4

## 2023-05-13 ENCOUNTER — Ambulatory Visit (INDEPENDENT_AMBULATORY_CARE_PROVIDER_SITE_OTHER): Payer: Commercial Managed Care - HMO | Admitting: Primary Care

## 2023-05-13 ENCOUNTER — Ambulatory Visit: Payer: 59 | Admitting: Dietician

## 2023-05-14 ENCOUNTER — Other Ambulatory Visit: Payer: Self-pay | Admitting: Obstetrics and Gynecology

## 2023-05-14 DIAGNOSIS — A599 Trichomoniasis, unspecified: Secondary | ICD-10-CM

## 2023-05-21 ENCOUNTER — Ambulatory Visit (INDEPENDENT_AMBULATORY_CARE_PROVIDER_SITE_OTHER): Payer: 59 | Admitting: Obstetrics and Gynecology

## 2023-05-21 ENCOUNTER — Encounter: Payer: Self-pay | Admitting: Obstetrics and Gynecology

## 2023-05-21 VITALS — BP 132/65 | HR 74 | Wt 232.2 lb

## 2023-05-21 DIAGNOSIS — O0992 Supervision of high risk pregnancy, unspecified, second trimester: Secondary | ICD-10-CM | POA: Diagnosis not present

## 2023-05-21 DIAGNOSIS — Z3A14 14 weeks gestation of pregnancy: Secondary | ICD-10-CM | POA: Diagnosis not present

## 2023-05-21 DIAGNOSIS — A599 Trichomoniasis, unspecified: Secondary | ICD-10-CM

## 2023-05-21 DIAGNOSIS — I10 Essential (primary) hypertension: Secondary | ICD-10-CM

## 2023-05-21 DIAGNOSIS — R110 Nausea: Secondary | ICD-10-CM

## 2023-05-21 DIAGNOSIS — Z8751 Personal history of pre-term labor: Secondary | ICD-10-CM | POA: Insufficient documentation

## 2023-05-21 DIAGNOSIS — O24112 Pre-existing diabetes mellitus, type 2, in pregnancy, second trimester: Secondary | ICD-10-CM

## 2023-05-21 DIAGNOSIS — O24911 Unspecified diabetes mellitus in pregnancy, first trimester: Secondary | ICD-10-CM

## 2023-05-21 DIAGNOSIS — O0990 Supervision of high risk pregnancy, unspecified, unspecified trimester: Secondary | ICD-10-CM

## 2023-05-21 DIAGNOSIS — Z23 Encounter for immunization: Secondary | ICD-10-CM

## 2023-05-21 DIAGNOSIS — O24912 Unspecified diabetes mellitus in pregnancy, second trimester: Secondary | ICD-10-CM

## 2023-05-21 DIAGNOSIS — O99012 Anemia complicating pregnancy, second trimester: Secondary | ICD-10-CM

## 2023-05-21 MED ORDER — ONDANSETRON HCL 4 MG PO TABS
4.0000 mg | ORAL_TABLET | Freq: Four times a day (QID) | ORAL | 0 refills | Status: DC
Start: 2023-05-21 — End: 2023-06-12

## 2023-05-21 MED ORDER — ASPIRIN 81 MG PO TBEC
81.0000 mg | DELAYED_RELEASE_TABLET | Freq: Every day | ORAL | 2 refills | Status: DC
Start: 2023-05-21 — End: 2023-10-30

## 2023-05-21 NOTE — Progress Notes (Signed)
Subjective:  Misty [name] is a 32 y.o. [phone] at [redacted]w[redacted]d being seen today for  her first OB visit. EDD by LMP and confirmed by first trimester U/S. Type 2 DM and CHTN. H/O PTD at 34 weeks due to [facility] with [facility].   ongoing prenatal care.  She is currently monitored for the following issues for this high-risk pregnancy and has Marijuana use; Anemia in pregnancy; Chronic hypertension; Type 2 diabetes mellitus (HCC); Trichomoniasis; Diabetes mellitus affecting pregnancy in first trimester; Nausea and vomiting in pregnancy; Subchorionic hemorrhage in first trimester; Supervision of high risk pregnancy, antepartum; and History of preterm delivery on their problem list.  Patient reports no complaints.  Contractions: Not present. Vag. Bleeding: None.  Movement: Absent. Denies leaking of fluid.   The following portions of the patient's history were reviewed and updated as appropriate: allergies, current medications, past family history, past medical history, past social history, past surgical history and problem list. Problem list updated.  Objective:   Vitals:   05/21/23 1402  BP: 132/65  Pulse: 74  Weight: 105.3 kg    Fetal Status: Fetal Heart Rate (bpm): 168   Movement: Absent     General:  Alert, oriented and cooperative. Patient is in no acute distress.  Skin: Skin is warm and dry. No rash noted.   Cardiovascular: Normal heart rate noted  Respiratory: Normal respiratory effort, no problems with respiration noted  Abdomen: Soft, gravid, appropriate for gestational age. Pain/Pressure: Absent     Pelvic:  Cervical exam deferred        Extremities: Normal range of motion.  Edema: None  Mental Status: Normal mood and affect. Normal behavior. Normal judgment and thought content.   Urinalysis:      Assessment and Plan:  Pregnancy: [id number] at [redacted]w[redacted]d  1. Supervision of high risk pregnancy in second trimester Prenatal care and labs and care reviewed with pt Genetic testing discussed -  ondansetron (ZOFRAN) 4 MG tablet; Take 1 tablet (4 mg total) by mouth every 6 (six) hours.  Dispense: 12 tablet; Refill: 0 - Flu vaccine trivalent PF, 6mos and older(Flulaval,Afluria,Fluarix,Fluzone)  3. Chronic hypertension Stable Continue with current regiment Serial growth and antenatal testing as per MFM guidelines - aspirin EC 81 MG tablet; Take 1 tablet (81 mg total) by mouth daily. Take after 12 weeks for prevention of preeclampsia later in pregnancy  Dispense: 300 tablet; Refill: 2  4. Type 2 diabetes mellitus complicating pregnancy in second trimester, antepartum DM and pregnancy reviewed with pt Risks reduction with glycemic control reviewed Did not bring CBG readings with her  CBG log provided to pt Advised to bring to all OB appts Continue with current regiment at this time Serial growth scans and antenatal testing as per MFM guidelines Fetal ECHO ordered - Ambulatory referral to Ophthalmology  5. Trichomoniasis TOC at next visit  6. Anemia during pregnancy in second trimester Stable   9. Nausea Stable - ondansetron (ZOFRAN) 4 MG tablet; Take 1 tablet (4 mg total) by mouth every 6 (six) hours.  Dispense: 12 tablet; Refill: 0  10. History of preterm delivery See Problem list for details  Preterm labor symptoms and general obstetric precautions including but not limited to vaginal bleeding, contractions, leaking of fluid and fetal movement were reviewed in detail with the patient. Please refer to After Visit Summary for other counseling recommendations.  Return in about 4 weeks (around 06/18/2023) for HROB with MD , OB visit, face to face, MD only.   Hermina Staggers, MD

## 2023-05-28 ENCOUNTER — Other Ambulatory Visit: Payer: 59

## 2023-06-12 ENCOUNTER — Other Ambulatory Visit: Payer: Self-pay | Admitting: Obstetrics and Gynecology

## 2023-06-12 DIAGNOSIS — O0992 Supervision of high risk pregnancy, unspecified, second trimester: Secondary | ICD-10-CM

## 2023-06-12 DIAGNOSIS — R110 Nausea: Secondary | ICD-10-CM

## 2023-06-13 DIAGNOSIS — O99210 Obesity complicating pregnancy, unspecified trimester: Secondary | ICD-10-CM | POA: Insufficient documentation

## 2023-06-24 ENCOUNTER — Other Ambulatory Visit: Payer: Self-pay | Admitting: *Deleted

## 2023-06-24 ENCOUNTER — Encounter: Payer: 59 | Admitting: Family Medicine

## 2023-06-24 ENCOUNTER — Ambulatory Visit: Payer: 59 | Admitting: *Deleted

## 2023-06-24 ENCOUNTER — Encounter: Payer: Self-pay | Admitting: *Deleted

## 2023-06-24 ENCOUNTER — Encounter: Payer: 59 | Admitting: Obstetrics & Gynecology

## 2023-06-24 ENCOUNTER — Ambulatory Visit: Payer: 59 | Attending: Obstetrics and Gynecology

## 2023-06-24 VITALS — BP 131/73 | HR 85

## 2023-06-24 DIAGNOSIS — O10012 Pre-existing essential hypertension complicating pregnancy, second trimester: Secondary | ICD-10-CM | POA: Diagnosis not present

## 2023-06-24 DIAGNOSIS — O0990 Supervision of high risk pregnancy, unspecified, unspecified trimester: Secondary | ICD-10-CM | POA: Diagnosis not present

## 2023-06-24 DIAGNOSIS — O99012 Anemia complicating pregnancy, second trimester: Secondary | ICD-10-CM

## 2023-06-24 DIAGNOSIS — O99212 Obesity complicating pregnancy, second trimester: Secondary | ICD-10-CM

## 2023-06-24 DIAGNOSIS — Z794 Long term (current) use of insulin: Secondary | ICD-10-CM

## 2023-06-24 DIAGNOSIS — O09212 Supervision of pregnancy with history of pre-term labor, second trimester: Secondary | ICD-10-CM | POA: Diagnosis not present

## 2023-06-24 DIAGNOSIS — O24112 Pre-existing diabetes mellitus, type 2, in pregnancy, second trimester: Secondary | ICD-10-CM | POA: Diagnosis not present

## 2023-06-24 DIAGNOSIS — E669 Obesity, unspecified: Secondary | ICD-10-CM

## 2023-06-24 DIAGNOSIS — O10919 Unspecified pre-existing hypertension complicating pregnancy, unspecified trimester: Secondary | ICD-10-CM

## 2023-06-24 DIAGNOSIS — D649 Anemia, unspecified: Secondary | ICD-10-CM | POA: Diagnosis not present

## 2023-06-24 DIAGNOSIS — O99210 Obesity complicating pregnancy, unspecified trimester: Secondary | ICD-10-CM | POA: Insufficient documentation

## 2023-06-24 DIAGNOSIS — Z3A19 19 weeks gestation of pregnancy: Secondary | ICD-10-CM

## 2023-06-24 DIAGNOSIS — F1990 Other psychoactive substance use, unspecified, uncomplicated: Secondary | ICD-10-CM

## 2023-06-24 DIAGNOSIS — E119 Type 2 diabetes mellitus without complications: Secondary | ICD-10-CM

## 2023-06-24 DIAGNOSIS — O99322 Drug use complicating pregnancy, second trimester: Secondary | ICD-10-CM | POA: Diagnosis not present

## 2023-06-26 IMAGING — US US MFM FETAL BPP W/O NON-STRESS
1 series · 14 of 28 positions shown · non-contrast
Comparison: none

[Series 1: us mfm fetal bpp w/o non-stress · 35 acquisitions, 14 frames shown]
[im 2/35]
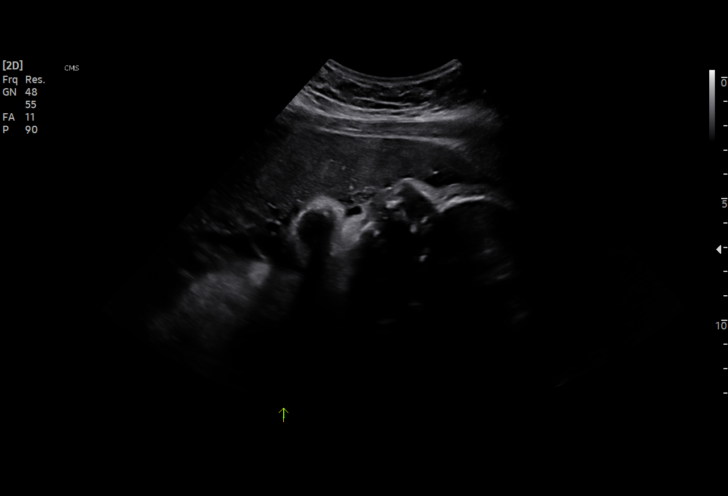
[im 4/35]
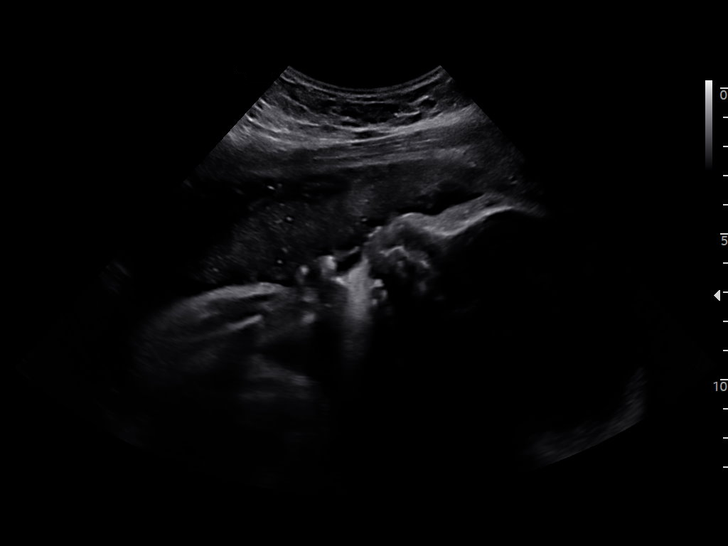
[im 7/35]
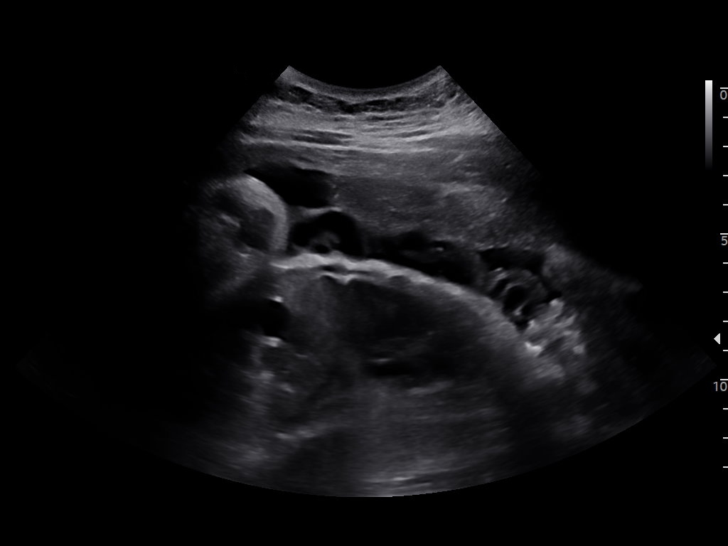
[im 9/35]
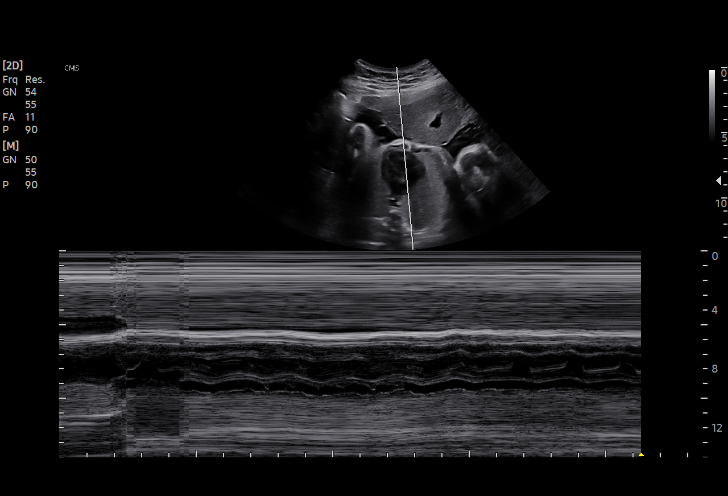
[im 12/35]
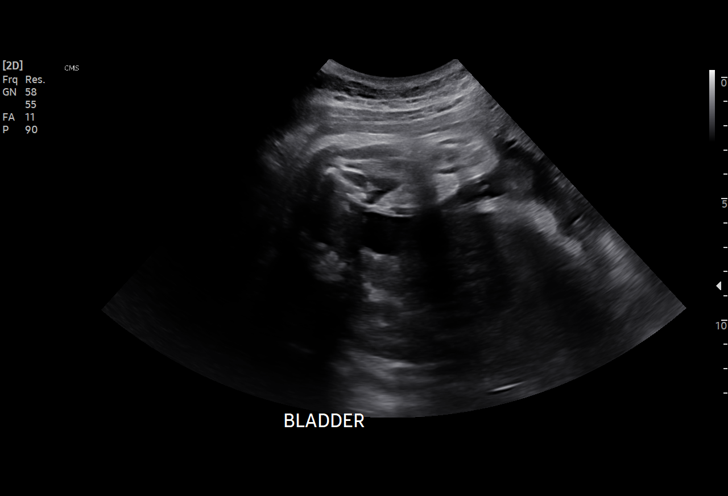
[im 14/35]
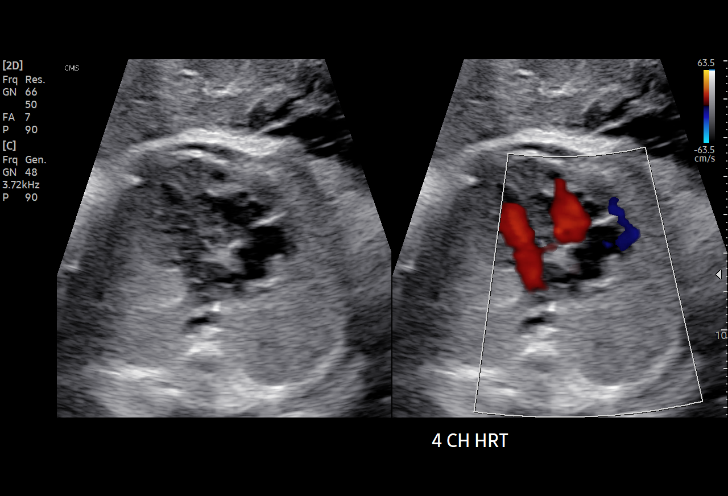
[im 17/35]
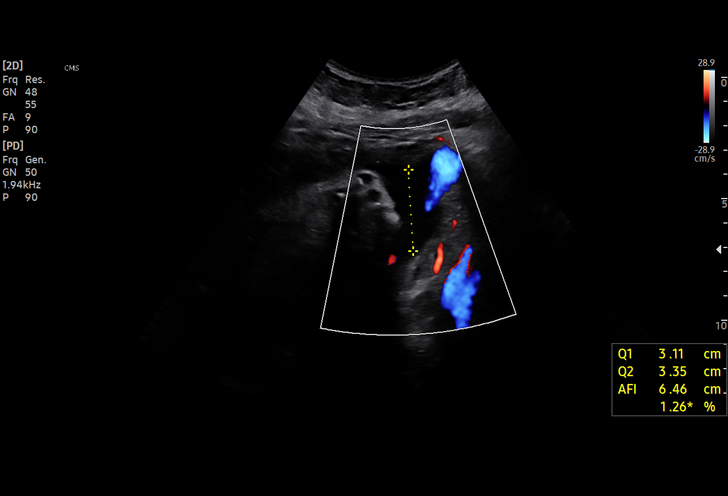
[im 19/35]
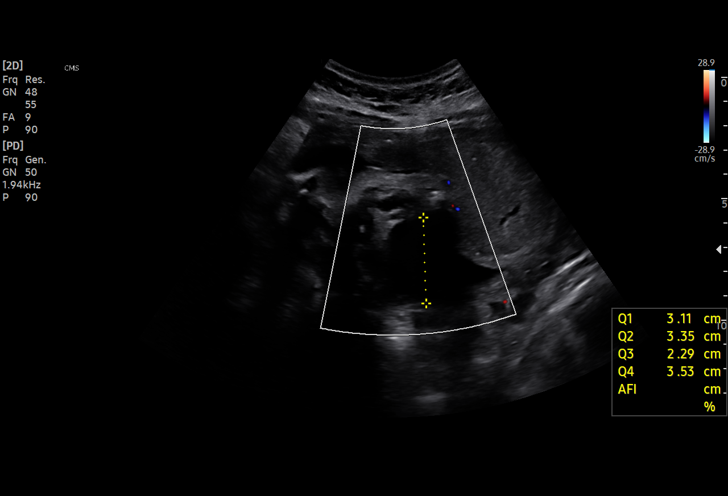
[im 22/35]
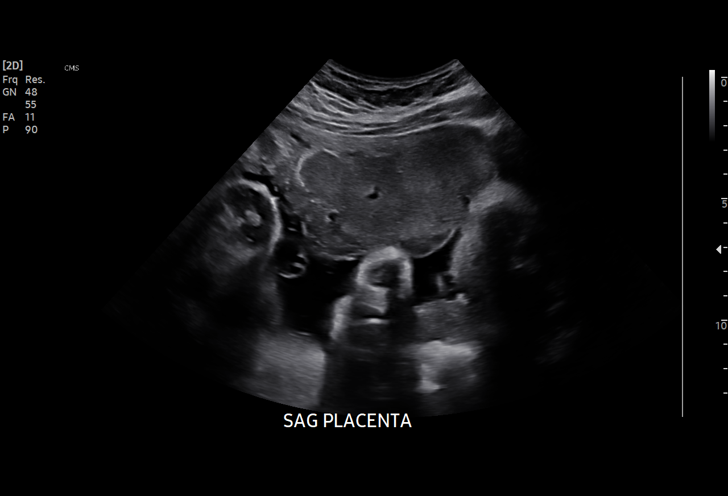
[im 24/35]
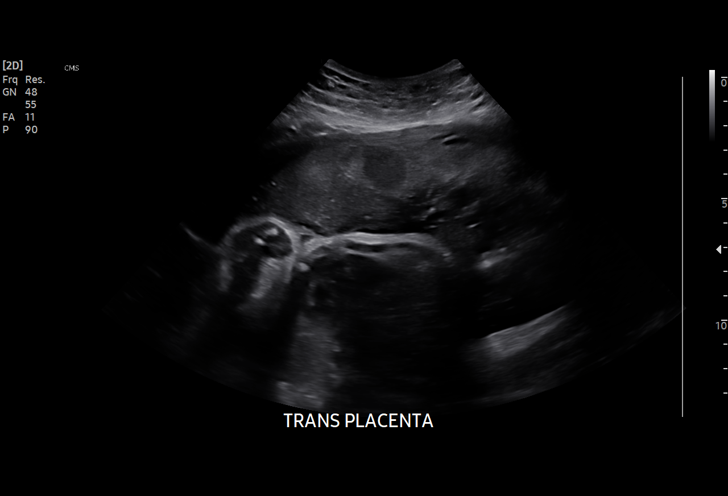
[im 27/35]
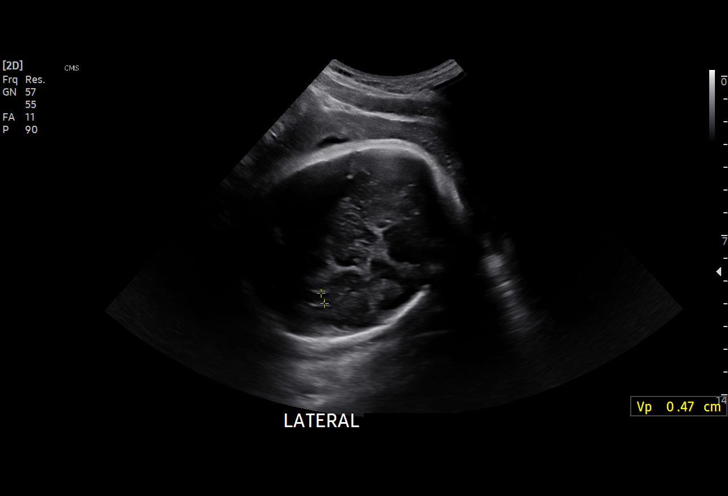
[im 29/35]
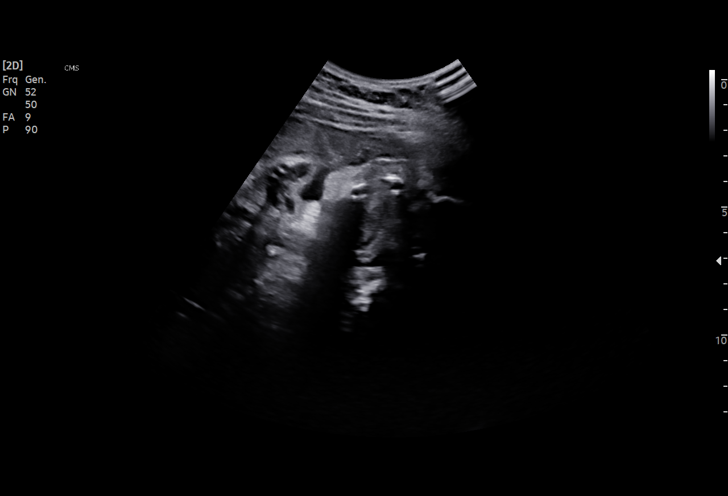
[im 32/35]
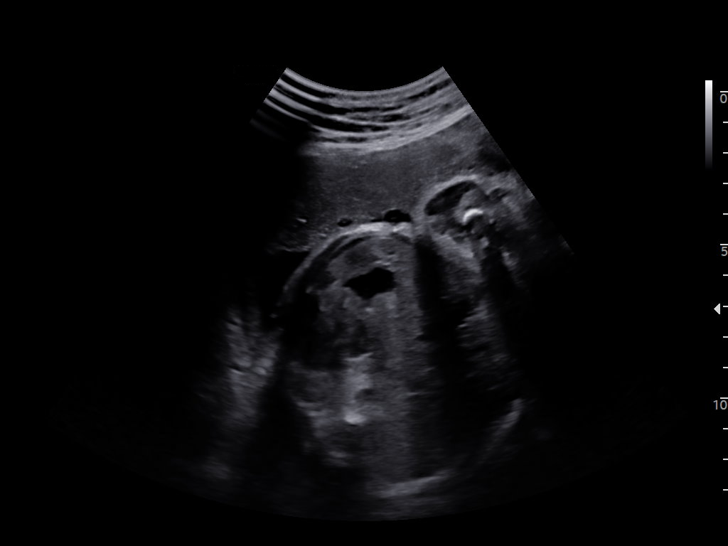
[im 35/35]
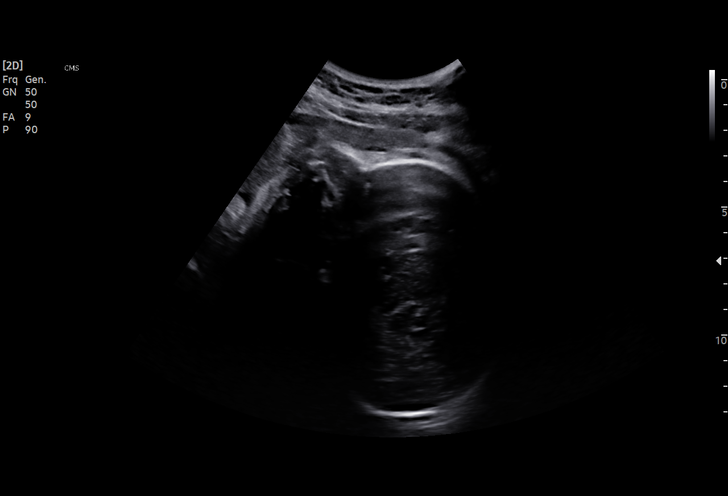

[14 of 28 positions shown; findings below may reference images not displayed]

Indications

 Pre-existing diabetes, type 2, in pregnancy,
 third trimester (insulin)
 Hypertension - Chronic with superimposed
 preeclampsia (nifedipine/labetalol)
 Mild to moderate preeclampsia, third
 trimester
 Abnormal biochemical finding on antenatal
 screening of mother (AFP 4.36)
 32 weeks gestation of pregnancy
 Encounter for other antenatal screening
 follow-up
 LR NIPS, Neg Horizon
Fetal Evaluation

 Num Of Fetuses:         1
 Fetal Heart Rate(bpm):  145
 Cardiac Activity:       Observed
 Presentation:           Cephalic
 Placenta:               Anterior
 P. Cord Insertion:      Previously Visualized

 Amniotic Fluid
 AFI FV:      Within normal limits

 AFI Sum(cm)     %Tile       Largest Pocket(cm)
 12.3            34

 RUQ(cm)       RLQ(cm)       LUQ(cm)        LLQ(cm)

Biophysical Evaluation

 Amniotic F.V:   Within normal limits       F. Tone:        Observed
 F. Movement:    Observed                   Score:          [DATE]
 F. Breathing:   Observed
Biometry

 LV:        4.7  mm
OB History

 Gravidity:    2          SAB:   1
 Living:       0
Gestational Age

 LMP:           33w 5d        Date:  10/14/20                 EDD:   07/21/21
 Best:          32w 1d     Det. By:  Early Ultrasound         EDD:   08/01/21
                                     (12/05/20)
Anatomy

 Ventricles:            Appears normal         Kidneys:                Appear normal
 Heart:                 Appears normal         Bladder:                Appears normal
                        (4CH, axis, and
                        situs)
 Stomach:               Appears normal, left
                        sided
Comments

 This patient was seen for a biophysical profile due to
 pregestational diabetes, elevated MSAFP of 4.36 MoM, and
 chronic hypertension treated with labetalol and nifedipine.
 The patient was hospitalized last week where she was
 diagnosed with superimposed preeclampsia.  Her PIH labs
 were within normal limits.  Her blood pressure today was
 130/86.  She is asymptomatic.
 A biophysical profile performed today was [DATE].
 There was normal amniotic fluid noted on today's ultrasound
 exam.
 Due to superimposed preeclampsia, we will increase the
 frequency of fetal testing to twice weekly.
 Delivery should probably occur at around 34 weeks.

## 2023-06-27 ENCOUNTER — Other Ambulatory Visit (INDEPENDENT_AMBULATORY_CARE_PROVIDER_SITE_OTHER): Payer: Self-pay | Admitting: Primary Care

## 2023-06-27 DIAGNOSIS — O24112 Pre-existing diabetes mellitus, type 2, in pregnancy, second trimester: Secondary | ICD-10-CM

## 2023-06-27 DIAGNOSIS — I10 Essential (primary) hypertension: Secondary | ICD-10-CM

## 2023-06-27 DIAGNOSIS — Z760 Encounter for issue of repeat prescription: Secondary | ICD-10-CM

## 2023-07-02 ENCOUNTER — Other Ambulatory Visit (INDEPENDENT_AMBULATORY_CARE_PROVIDER_SITE_OTHER): Payer: Self-pay | Admitting: Primary Care

## 2023-07-02 DIAGNOSIS — Z760 Encounter for issue of repeat prescription: Secondary | ICD-10-CM

## 2023-07-02 DIAGNOSIS — I10 Essential (primary) hypertension: Secondary | ICD-10-CM

## 2023-07-02 DIAGNOSIS — O24112 Pre-existing diabetes mellitus, type 2, in pregnancy, second trimester: Secondary | ICD-10-CM

## 2023-07-03 ENCOUNTER — Encounter: Payer: 59 | Admitting: Family Medicine

## 2023-07-03 IMAGING — US US MFM FETAL BPP W/ NON-STRESS
1 series · 13 of 28 positions shown · non-contrast
Comparison: none

[Series 1: us mfm fetal bpp w/ non-stress · 37 acquisitions, 13 frames shown]
[im 2/37]
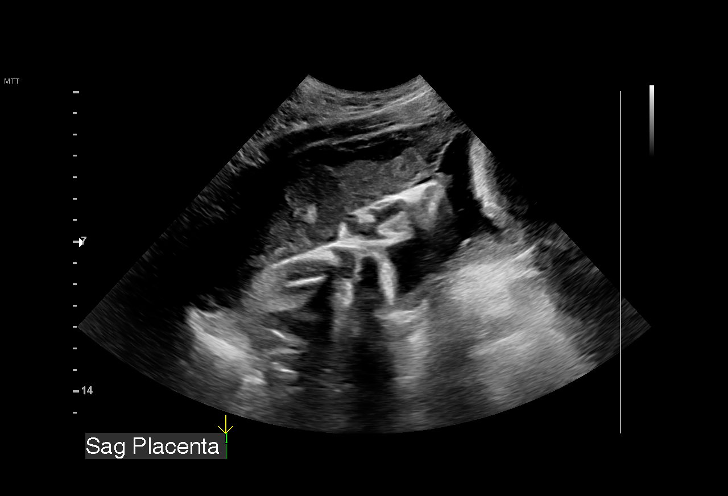
[im 5/37]
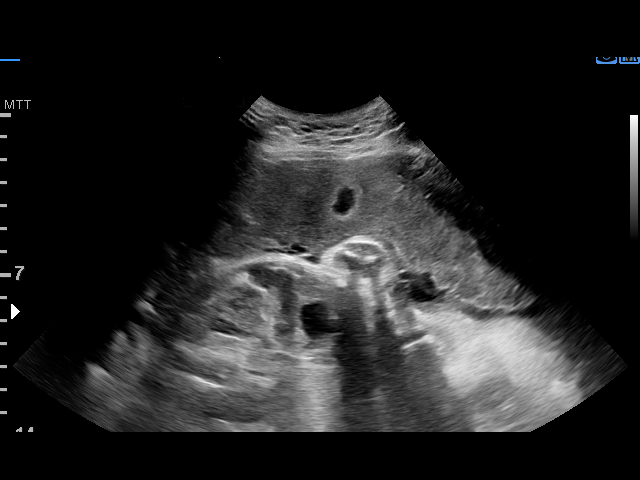
[im 7/37]
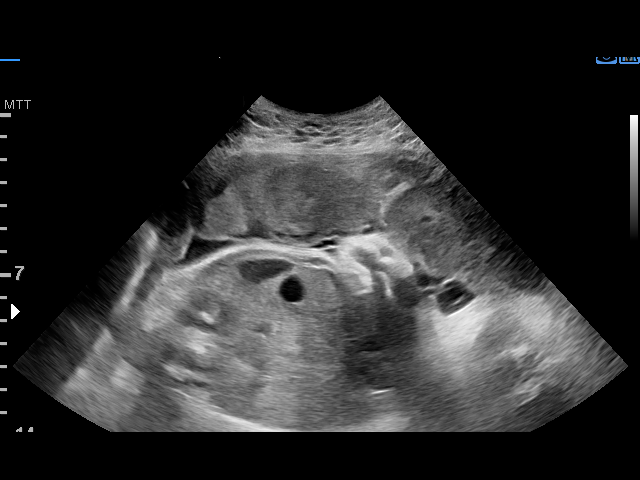
[im 10/37]
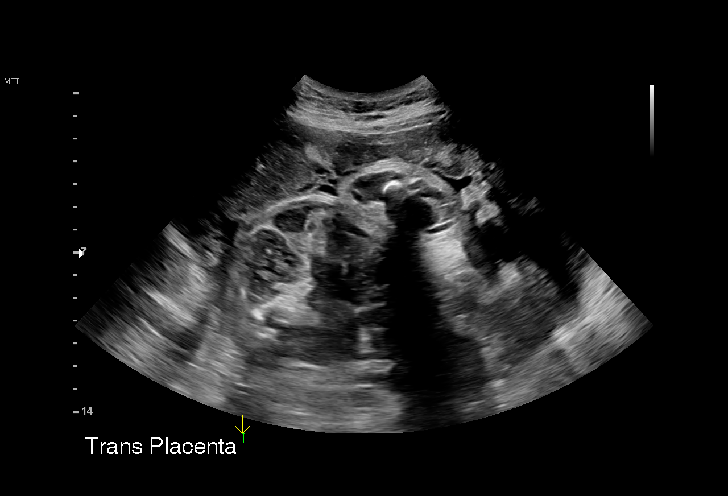
[im 13/37]
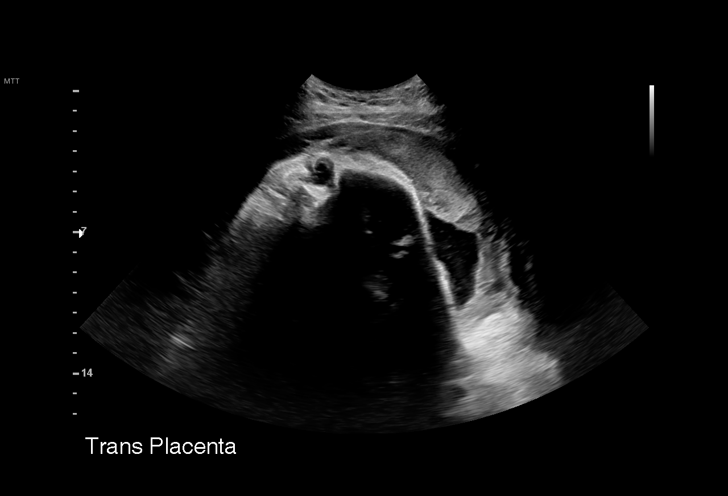
[im 15/37]
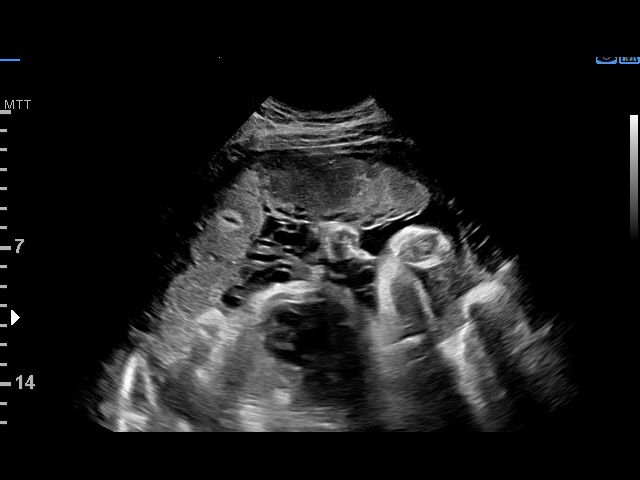
[im 19/37]
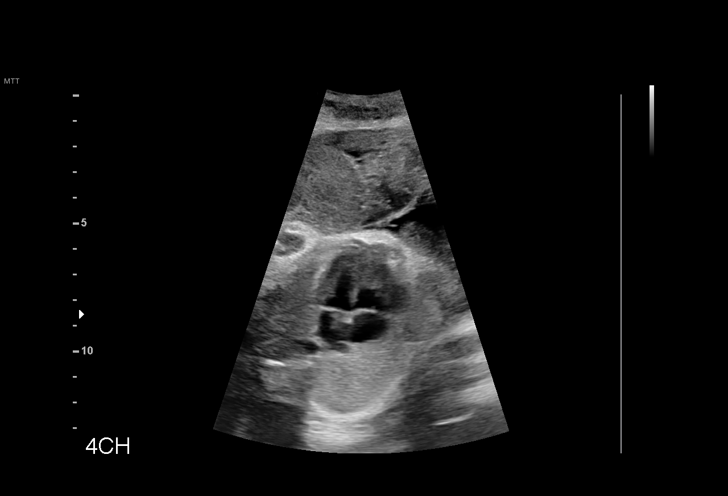
[im 22/37]
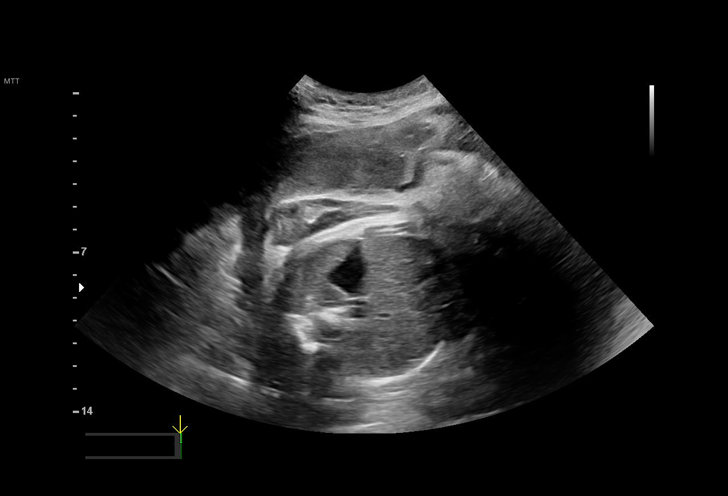
[im 25/37]
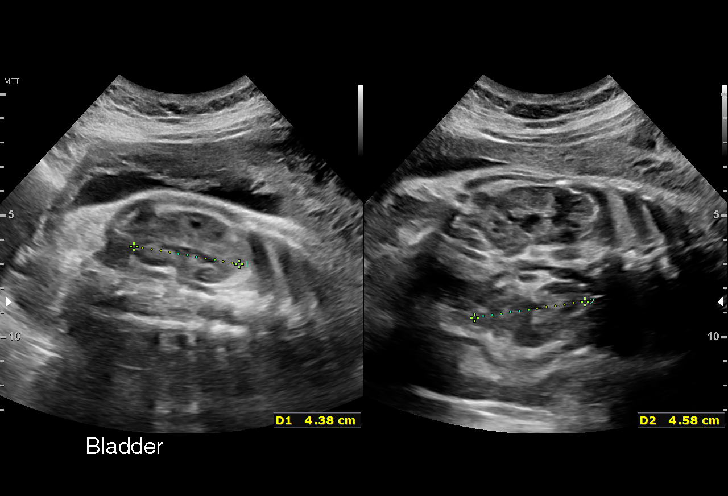
[im 27/37]
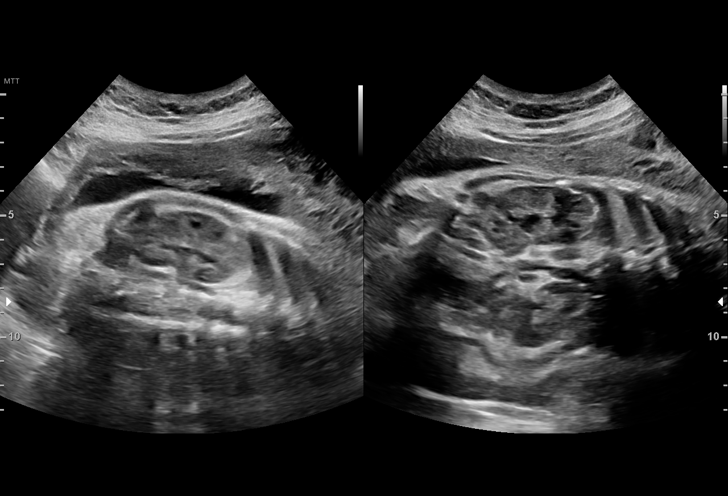
[im 30/37]
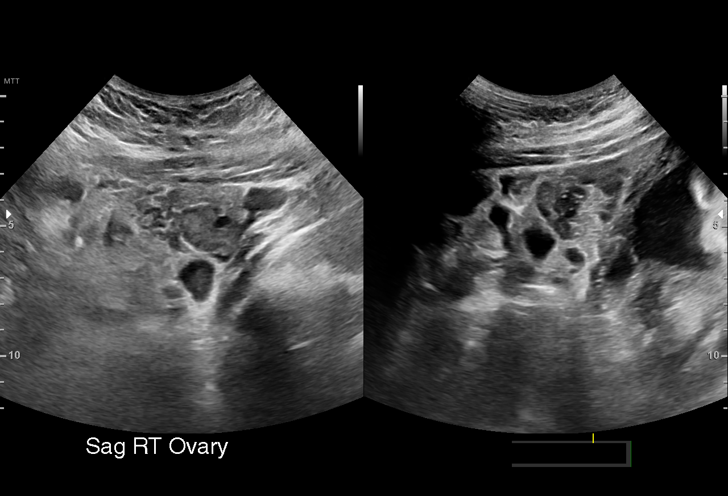
[im 33/37]
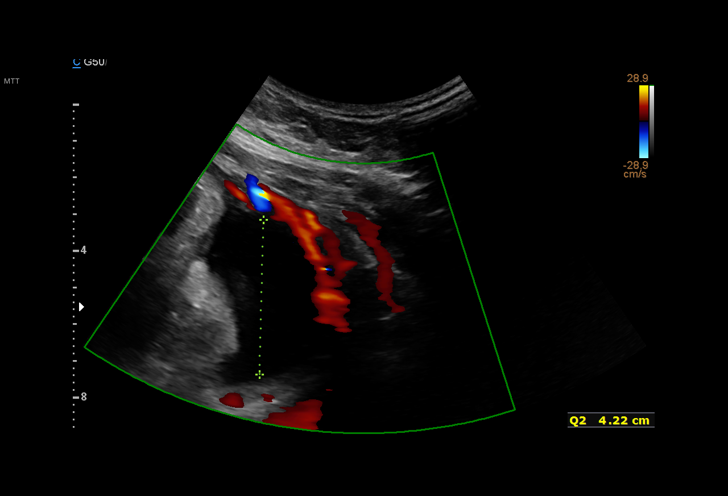
[im 35/37]
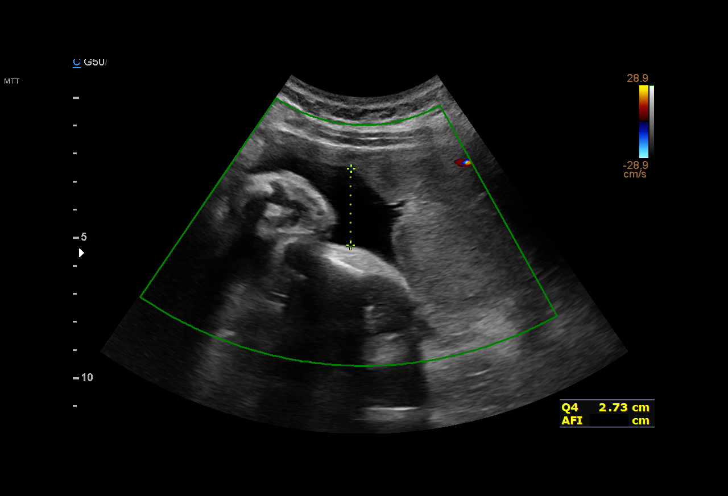

[13 of 28 positions shown; findings below may reference images not displayed]

W/NONSTRESS

Indications

 33 weeks gestation of pregnancy
 Pre-existing diabetes, type 2, in pregnancy,
 third trimester (insulin)
 Hypertension - Chronic with superimposed
 preeclampsia (nifedipine/labetalol)
 Mild to moderate preeclampsia, third
 trimester
 Abnormal biochemical finding on antenatal
 screening of mother (AFP 4.36)
 Encounter for other antenatal screening
 follow-up
 LR NIPS, Neg Horizon
Fetal Evaluation

 Num Of Fetuses:         1
 Fetal Heart Rate(bpm):  145
 Cardiac Activity:       Observed
 Presentation:           Cephalic
 Placenta:               Anterior
 P. Cord Insertion:      Previously Visualized

 Amniotic Fluid
 AFI FV:      Within normal limits

 AFI Sum(cm)     %Tile       Largest Pocket(cm)
 13.1            41

 RUQ(cm)       RLQ(cm)       LUQ(cm)        LLQ(cm)

Biophysical Evaluation

 Amniotic F.V:   Pocket => 2 cm             F. Tone:        Observed
 F. Movement:    Observed                   N.S.T:          Reactive
 F. Breathing:   Observed                   Score:          [DATE]
OB History

 Gravidity:    2          SAB:   1
 Living:       0
Gestational Age

 LMP:           34w 5d        Date:  10/14/20                 EDD:   07/21/21
 Best:          33w 1d     Det. By:  Early Ultrasound         EDD:   08/01/21
                                     (12/05/20)
Anatomy

 Ventricles:            Appears normal         Stomach:                Appears normal, left
                                                                       sided
 Thoracic:              Appears normal         Kidneys:                Appear normal
 Diaphragm:             Appears normal         Bladder:                Appears normal

 Other:  Technically difficult due to advanced GA and fetal position.
Cervix Uterus Adnexa

 Cervix
 Not visualized (advanced GA >39wks)

 Uterus
 No abnormality visualized.

 Right Ovary
 Within normal limits.

 Left Ovary
 Within normal limits.

 Cul De Sac
 No free fluid seen.

 Adnexa
 No abnormality visualized.
Impression

 Amniotic fluid is normal good fetal activity seen.  Antenatal
 testing is reassuring.  NST is reactive.  BPP [DATE].
 I have reassured the patient of the findings.
 xxxxxxxxxxxxx[phone]
 Consultation (see [REDACTED] )
 Ms. Langone returned for ultrasound evaluation today. She is
 G2 P0 at 33w 1d gestation and is here for antenatal testing.
 Her problems include:
 -Chronic hypertension with superimposed preeclampsia
 without severe features.  Patient takes labetalol 200 mg twice
 daily and nifedipine XL 60 mg daily.  Blood pressure today at
 her office is 129/87 mmHg.
 Patient was admitted on 05/29/2021 and was discharged 4
 days later.  Admission labs including liver enzymes, platelets
 and serum creatinine were within normal range.  Repeat labs
 drawn on 06/12/2021 were within normal range.
 preeclampsia.

 morning and 18 units at night, and Humalog [DATE] units with
 each meal.  She reports her fasting levels are below 95
 mg/DL and postprandial levels are within normal range.

 -Increased to maternal serum alpha-fetoprotein on
 midtrimester screening. On previous ultrasound assessment,
 fetal growth was appropriate for gestational age.
 Our concerns include:
 Chronic hypertension with superimposed preeclampsia
 Patient had few blood pressures in the severe hypertensive
 range on admission that later resolved.  She does not have
 symptoms and signs of severe features of preeclampsia.
 She has a diagnosis of chronic hypertension with
 superimposed preeclampsia.
 Type 2 diabetes
 Patient reports all her fasting and postprandial levels are
 within normal range and that diabetes is well controlled.
 I counseled her on the timing of delivery.  Given that she has
 super imposed preeclampsia, I recommend delivery at 37
 weeks gestation.  However if blood pressures are not well
 controlled with current medications or if diabetes is not well
 controlled, hospitalization may be considered at 6 weeks
 gestation.  Alternatively, she may be delivered at 36 weeks
 gestation.
 Patient reports she will be unable to come twice weekly for
 antenatal testing.  We made appointments for weekly
 antenatal testing.
Recommendations

 -Appointments were made for weekly antenatal testing.
 -Patient has blood pressure cuff at home and will be
 monitoring her blood pressures.  She understands the
 parameters.
 -Delivery at 37 weeks gestation.
 -Inpatient management if blood pressure and/or diabetes is
 not well controlled before 37 weeks gestation.  If patient is not
 willing for admission, delivery may be considered at 36 weeks
 gestation.
 -Delivery at 34 weeks or at diagnosis if severe features of
 preeclampsia are detected.
                 Khanduri, Mamiko

## 2023-07-03 NOTE — Telephone Encounter (Signed)
Requested Prescriptions  Pending Prescriptions Disp Refills   NIFEdipine (ADALAT CC) 60 MG 24 hr tablet [Pharmacy Med Name: nifedipine ER 60 mg tablet,extended release] 180 tablet 1    Sig: TAKE 1 TABLET BY MOUTH 2 TIMES DAILY     Cardiovascular: Calcium Channel Blockers 2 Passed - 07/02/2023  2:52 PM      Passed - Last BP in normal range    BP Readings from Last 1 Encounters:  06/24/23 131/73         Passed - Last Heart Rate in normal range    Pulse Readings from Last 1 Encounters:  06/24/23 85         Passed - Valid encounter within last 6 months    Recent Outpatient Visits           4 months ago Type 2 diabetes mellitus with other specified complication, unspecified whether long term insulin use (HCC)   [place] [facility] Grayce Sessions, NP   7 months ago Screening for STD (sexually transmitted disease)   [place] [facility]   7 months ago Type 2 diabetes mellitus with long term insulin   [place] [facility] Grayce Sessions, NP   1 year ago Need for immunization against influenza   [place] [facility] Grayce Sessions, NP   1 year ago Bumps on skin   [place] [facility] Grayce Sessions, NP

## 2023-07-08 DIAGNOSIS — O24112 Pre-existing diabetes mellitus, type 2, in pregnancy, second trimester: Secondary | ICD-10-CM | POA: Diagnosis not present

## 2023-07-09 ENCOUNTER — Ambulatory Visit (INDEPENDENT_AMBULATORY_CARE_PROVIDER_SITE_OTHER): Payer: 59 | Admitting: Family Medicine

## 2023-07-09 ENCOUNTER — Other Ambulatory Visit (HOSPITAL_COMMUNITY)
Admission: RE | Admit: 2023-07-09 | Discharge: 2023-07-09 | Disposition: A | Discharge status: Home / Self Care | Payer: 59 | Source: Ambulatory Visit | Attending: Family Medicine | Admitting: Family Medicine

## 2023-07-09 ENCOUNTER — Other Ambulatory Visit: Payer: Self-pay

## 2023-07-09 VITALS — BP 126/62 | HR 89 | Wt 236.2 lb

## 2023-07-09 DIAGNOSIS — Z202 Contact with and (suspected) exposure to infections with a predominantly sexual mode of transmission: Secondary | ICD-10-CM

## 2023-07-09 DIAGNOSIS — O0992 Supervision of high risk pregnancy, unspecified, second trimester: Secondary | ICD-10-CM

## 2023-07-09 DIAGNOSIS — Z113 Encounter for screening for infections with a predominantly sexual mode of transmission: Secondary | ICD-10-CM

## 2023-07-09 DIAGNOSIS — A599 Trichomoniasis, unspecified: Secondary | ICD-10-CM

## 2023-07-09 DIAGNOSIS — I10 Essential (primary) hypertension: Secondary | ICD-10-CM

## 2023-07-09 DIAGNOSIS — O99012 Anemia complicating pregnancy, second trimester: Secondary | ICD-10-CM

## 2023-07-09 DIAGNOSIS — Z3A21 21 weeks gestation of pregnancy: Secondary | ICD-10-CM

## 2023-07-09 DIAGNOSIS — O24112 Pre-existing diabetes mellitus, type 2, in pregnancy, second trimester: Secondary | ICD-10-CM

## 2023-07-09 MED ORDER — SCOPOLAMINE 1 MG/3DAYS TD PT72: 1.0000 | MEDICATED_PATCH | TRANSDERMAL | 12 refills | Status: DC

## 2023-07-09 MED ORDER — PRENATAL VITAMIN 27-0.8 MG PO TABS
1.0000 | ORAL_TABLET | Freq: Every day | ORAL | 11 refills | Status: DC
Start: 2023-07-09 — End: 2024-01-29

## 2023-07-09 MED ORDER — ACETAMINOPHEN 500 MG PO TABS: 500.0000 mg | ORAL_TABLET | Freq: Four times a day (QID) | ORAL | 0 refills | Status: DC | PRN

## 2023-07-09 NOTE — Progress Notes (Unsigned)
   PRENATAL VISIT NOTE  Subjective:  Misty [name] is a 32 y.o. [phone] at [redacted]w[redacted]d being seen today for ongoing prenatal care.  She is currently monitored for the following issues for this {Blank single:19197::"high-risk","low-risk"} pregnancy and has Marijuana use; Anemia in pregnancy; Chronic hypertension; Type 2 diabetes mellitus (HCC); Trichomoniasis; Diabetes mellitus affecting pregnancy in first trimester; Nausea and vomiting in pregnancy; Subchorionic hemorrhage in first trimester; Supervision of high risk pregnancy, antepartum; History of preterm delivery; and Obesity affecting pregnancy, antepartum on their problem list.  Patient reports {sx:14538}.  Contractions: Not present. Vag. Bleeding: None.  Movement: Present. Denies leaking of fluid.   The following portions of the patient's history were reviewed and updated as appropriate: allergies, current medications, past family history, past medical history, past social history, past surgical history and problem list.   Objective:   Vitals:   07/09/23 1640  BP: 126/62  Pulse: 89  Weight: 236 lb 3.2 oz (107.1 kg)    Fetal Status:     Movement: Present     General:  Alert, oriented and cooperative. Patient is in no acute distress.  Skin: Skin is warm and dry. No rash noted.   Cardiovascular: Normal heart rate noted  Respiratory: Normal respiratory effort, no problems with respiration noted  Abdomen: Soft, gravid, appropriate for gestational age.  Pain/Pressure: Present     Pelvic: {Blank single:19197::"Cervical exam performed in the presence of a chaperone","Cervical exam deferred"}        Extremities: Normal range of motion.  Edema: Trace  Mental Status: Normal mood and affect. Normal behavior. Normal judgment and thought content.   Assessment and Plan:  Pregnancy: [id number] at [redacted]w[redacted]d 1. Supervision of high risk pregnancy in second trimester ***  2. Chronic hypertension ***  3. Type 2 diabetes mellitus complicating pregnancy  in second trimester, antepartum ***  4. Trichomoniasis ***  5. Anemia during pregnancy in second trimester ***  6. [redacted] weeks gestation of pregnancy ***  7. Trichomonas contact, treated *** - Cervicovaginal ancillary only( [place])  8. Screening for STD (sexually transmitted disease) *** - Prenatal Vit-Fe Fumarate-FA (PRENATAL VITAMIN) 27-0.8 MG TABS; Take 1 tablet by mouth daily.  Dispense: 30 tablet; Refill: 11  {Blank single:19197::"Term","Preterm"} labor symptoms and general obstetric precautions including but not limited to vaginal bleeding, contractions, leaking of fluid and fetal movement were reviewed in detail with the patient. Please refer to After Visit Summary for other counseling recommendations.   No follow-ups on file.  Future Appointments  Date Time Provider Department Center  07/23/2023 10:15 AM [facility] NURSE [facility]  07/23/2023 10:30 AM [facility] US3 [facility]  08/07/2023  3:55 PM [place] Bing, MD [facility]    Celedonio Savage, MD

## 2023-07-11 ENCOUNTER — Other Ambulatory Visit: Payer: Self-pay | Admitting: Certified Nurse Midwife

## 2023-07-11 DIAGNOSIS — A599 Trichomoniasis, unspecified: Secondary | ICD-10-CM

## 2023-07-11 LAB — CERVICOVAGINAL ANCILLARY ONLY
Bacterial Vaginitis (gardnerella): POSITIVE — AB
Candida Glabrata: NEGATIVE
Candida Vaginitis: NEGATIVE
Chlamydia: NEGATIVE
Comment: NEGATIVE
Comment: NEGATIVE
Comment: NEGATIVE
Comment: NEGATIVE
Comment: NEGATIVE
Comment: NORMAL
Neisseria Gonorrhea: NEGATIVE
Trichomonas: POSITIVE — AB

## 2023-07-18 ENCOUNTER — Inpatient Hospital Stay (HOSPITAL_COMMUNITY)
Admission: AD | Admit: 2023-07-18 | Discharge: 2023-07-18 | Disposition: A | Discharge status: Home / Self Care | Payer: 59 | Attending: Obstetrics and Gynecology | Admitting: Obstetrics and Gynecology

## 2023-07-18 ENCOUNTER — Encounter (HOSPITAL_COMMUNITY): Payer: Self-pay | Admitting: Obstetrics and Gynecology

## 2023-07-18 DIAGNOSIS — Z3A22 22 weeks gestation of pregnancy: Secondary | ICD-10-CM | POA: Diagnosis not present

## 2023-07-18 DIAGNOSIS — R1084 Generalized abdominal pain: Secondary | ICD-10-CM | POA: Diagnosis not present

## 2023-07-18 DIAGNOSIS — N898 Other specified noninflammatory disorders of vagina: Secondary | ICD-10-CM

## 2023-07-18 DIAGNOSIS — O26892 Other specified pregnancy related conditions, second trimester: Secondary | ICD-10-CM | POA: Insufficient documentation

## 2023-07-18 DIAGNOSIS — O0990 Supervision of high risk pregnancy, unspecified, unspecified trimester: Secondary | ICD-10-CM

## 2023-07-18 LAB — WET PREP, GENITAL
Sperm: NONE SEEN
Trich, Wet Prep: NONE SEEN
WBC, Wet Prep HPF POC: >=10 — AB (ref <10)
Yeast Wet Prep HPF POC: NONE SEEN

## 2023-07-18 LAB — URINALYSIS, ROUTINE W REFLEX MICROSCOPIC
Bilirubin Urine: NEGATIVE
Glucose, UA: NEGATIVE mg/dL
Hgb urine dipstick: NEGATIVE
Ketones, ur: NEGATIVE mg/dL
Nitrite: NEGATIVE
Protein, ur: NEGATIVE mg/dL
Specific Gravity, Urine: 1.002 — ABNORMAL LOW (ref 1.005–1.030)
pH: 6 (ref 5.0–8.0)

## 2023-07-18 NOTE — MAU Note (Signed)
.  Misty [name] is a 33 y.o. at [redacted]w[redacted]d here in MAU reporting: went to [facility] and had a clear "sticky" vaginal discharge when she wiped. Also c/o pelvic pain and cramping. Denies any vag bleeding.  LMP:  Onset of complaint: 100pm Pain score: 8 Vitals:   07/18/23 1409  BP: 123/63  Pulse: 68  Resp: 18  Temp: 98.4 F (36.9 C)     FHT:172 Lab orders placed from triage:  u/a

## 2023-07-18 NOTE — MAU Provider Note (Signed)
History     CSN: [id number]  Arrival date and time: 07/18/23 1346   Event Date/Time   First Provider Initiated Contact with Patient 07/18/23 1440      Chief Complaint  Patient presents with   Vaginal Discharge   Abdominal Pain   HPI Patient presenting for evaluation because she "lost her mucous plug".  Patient reports that she was at home and went to the restroom and noticed a clear sticky discharge from her vagina.  She also had an episode of pelvic pain.  Patient with recent diagnosis of trichomoniasis which was treated once without resolution.  She is finishing up her second treatment regimen with 2 more days left.  Denies any other symptoms.  OB History     Gravida  4   Para  1   Term  0   Preterm  1   AB  2   Living  1      SAB  2   IAB  0   Ectopic  0   Multiple  0   Live Births  1           Past Medical History:  Diagnosis Date   Abscess 09/02/2020   Allergy    Anemia 2015   Asthma    as child   Chronic hypertension with superimposed preeclampsia 05/24/2021   Diabetes mellitus without complication (HCC) 2015   DKA (diabetic ketoacidosis) (HCC) 09/02/2020   Eczema    Hypertension 09/2013   Trichomonas infection 04/2023   UTI (urinary tract infection)     Past Surgical History:  Procedure Laterality Date   Abcess on back     NO PAST SURGERIES      Family History  Problem Relation Age of Onset   Migraines Mother    Hypertension Mother    Heart disease Mother 60       CHF   Diabetes Mother    Healthy Father    Sickle cell anemia Brother    Migraines Maternal Grandmother    Diabetes Maternal Grandmother    Heart disease Maternal Grandmother    Hypertension Maternal Grandfather    Diabetes Maternal Grandfather    Heart disease Maternal Grandfather     Social History   Tobacco Use   Smoking status: Former    Current packs/day: 0.00    Average packs/day: 0.5 packs/day for 2.0 years (1.0 ttl pk-yrs)    Types: Cigarettes     Start date: 09/09/2011    Quit date: 09/08/2013    Years since quitting: 9.8   Smokeless tobacco: Never  Vaping Use   Vaping status: Never Used  Substance Use Topics   Alcohol use: No    Alcohol/week: 0.0 standard drinks of alcohol   Drug use: No    Allergies:  Allergies  Allergen Reactions   Iodides Anaphylaxis and Itching   Morphine And Codeine Anaphylaxis and Itching   Shellfish Allergy Anaphylaxis and Itching    Pt reports "watery eyes"   Ultram [Tramadol Hcl] Hives    Hives and swollen lips     Medications Prior to Admission  Medication Sig Dispense Refill Last Dose   aspirin EC 81 MG tablet Take 1 tablet (81 mg total) by mouth daily. Take after 12 weeks for prevention of preeclampsia later in pregnancy 300 tablet 2 07/18/2023   Insulin Glargine (BASAGLAR KWIKPEN) 100 UNIT/ML Inject 18 Units into the skin 2 (two) times daily.   07/18/2023   labetalol (NORMODYNE) 100 MG tablet TAKE 1  TABLET BY MOUTH 2 TIMES DAILY 180 tablet 1 07/18/2023   metroNIDAZOLE (FLAGYL) 500 MG tablet TAKE 1 TABLET BY MOUTH 2 TIMES DAILY 14 tablet 0 07/17/2023   NIFEdipine (PROCARDIA XL/NIFEDICAL XL) 60 MG 24 hr tablet Take 60 mg by mouth daily.   07/18/2023   acetaminophen (TYLENOL) 500 MG tablet Take 1 tablet (500 mg total) by mouth every 6 (six) hours as needed for mild pain (pain score 1-3). 30 tablet 0    Blood Glucose Monitoring Suppl DEVI 1 each by Does not apply route in the morning, at noon, and at bedtime. May substitute to any manufacturer covered by patient's insurance. 1 each 0    Blood Pressure Monitoring (BLOOD PRESSURE MONITOR AUTOMAT) DEVI 1 each by Does not apply route daily. 1 each 0    cyclobenzaprine (FLEXERIL) 10 MG tablet Take 1 tablet (10 mg total) by mouth 2 (two) times daily as needed. 20 tablet 0    ondansetron (ZOFRAN) 4 MG tablet TAKE 1 TABLET BY MOUTH EVERY 6 HOURS 12 tablet 0    Prenatal Vit-Fe Fumarate-FA (PRENATAL VITAMIN) 27-0.8 MG TABS Take 1 tablet by mouth daily. 30  tablet 11    scopolamine (TRANSDERM-SCOP) 1 MG/3DAYS Place 1 patch (1.5 mg total) onto the skin every 3 (three) days. 10 patch 12     Review of Systems  Constitutional:  Negative for chills and fever.  HENT:  Negative for congestion and rhinorrhea.   Eyes:  Negative for visual disturbance.  Respiratory:  Negative for chest tightness and shortness of breath.   Cardiovascular:  Negative for chest pain.  Gastrointestinal:  Positive for abdominal pain. Negative for diarrhea, nausea and vomiting.  Endocrine: Negative for polyuria.  Genitourinary:  Negative for vaginal discharge and vaginal pain.  Neurological:  Negative for weakness, light-headedness and headaches.   Physical Exam   Blood pressure 120/64, pulse 80, temperature 98.4 F (36.9 C), resp. rate 18, height 5' 3.5" (1.613 m), weight 106.1 kg, last menstrual period 02/10/2023.  Physical Exam Vitals reviewed.  Constitutional:      Appearance: She is well-developed and normal weight.  HENT:     Head: Normocephalic and atraumatic.     Mouth/Throat:     Mouth: Mucous membranes are moist.     Pharynx: Oropharynx is clear.  Cardiovascular:     Rate and Rhythm: Normal rate.     Heart sounds: No murmur heard. Pulmonary:     Effort: Pulmonary effort is normal.  Abdominal:     General: There is no distension.     Palpations: Abdomen is soft.     Tenderness: There is no abdominal tenderness.  Genitourinary:    Vagina: Vaginal discharge (green frothy) present. No bleeding.     Cervix: Normal.     Comments: Cervix visually closed  Skin:    General: Skin is warm.     Capillary Refill: Capillary refill takes less than 2 seconds.  Neurological:     General: No focal deficit present.     Mental Status: She is alert.  Psychiatric:        Mood and Affect: Mood normal.    MAU Course  Procedures  MDM Wet prep GC chlamydia   Assessment and Plan  Misty [name] is a 32 yo [phone] @ [redacted]w[redacted]d presenting for vaginal  discharge  Vaginal discharge Patient presenting for evaluation because of vaginal discharge that she noticed while using the restroom.  Recent diagnosis of trichomoniasis and BV.  Currently on treatment with 2  more days of treatment.  Denies any bleeding, gush of fluid, contractions.  Does report 1 episode of pelvic pain that has since resolved.  Physical exam was reassuring with green frothy discharge noted.  Positive for BV which she has been treated for.  No trichomonas seen on wet prep today.  Discussed that this is likely a resolving infection and patient understanding.  Patient agreeable for discharge.  Patient discharged home.  Strict return precautions given.  Celedonio Savage 07/18/2023, 3:39 PM

## 2023-07-19 LAB — GC/CHLAMYDIA PROBE AMP (~~LOC~~) NOT AT ARMC
Chlamydia: NEGATIVE
Comment: NEGATIVE
Comment: NORMAL
Neisseria Gonorrhea: NEGATIVE

## 2023-07-23 ENCOUNTER — Other Ambulatory Visit: Payer: Self-pay | Admitting: *Deleted

## 2023-07-23 ENCOUNTER — Ambulatory Visit: Payer: 59

## 2023-07-23 ENCOUNTER — Other Ambulatory Visit: Payer: Self-pay

## 2023-07-23 ENCOUNTER — Ambulatory Visit: Payer: 59 | Attending: Obstetrics | Admitting: *Deleted

## 2023-07-23 VITALS — BP 124/59 | HR 62

## 2023-07-23 DIAGNOSIS — D649 Anemia, unspecified: Secondary | ICD-10-CM | POA: Diagnosis not present

## 2023-07-23 DIAGNOSIS — O10012 Pre-existing essential hypertension complicating pregnancy, second trimester: Secondary | ICD-10-CM | POA: Diagnosis not present

## 2023-07-23 DIAGNOSIS — O09212 Supervision of pregnancy with history of pre-term labor, second trimester: Secondary | ICD-10-CM | POA: Diagnosis not present

## 2023-07-23 DIAGNOSIS — Z362 Encounter for other antenatal screening follow-up: Secondary | ICD-10-CM | POA: Insufficient documentation

## 2023-07-23 DIAGNOSIS — Z3A23 23 weeks gestation of pregnancy: Secondary | ICD-10-CM | POA: Insufficient documentation

## 2023-07-23 DIAGNOSIS — E669 Obesity, unspecified: Secondary | ICD-10-CM

## 2023-07-23 DIAGNOSIS — E1169 Type 2 diabetes mellitus with other specified complication: Secondary | ICD-10-CM

## 2023-07-23 DIAGNOSIS — O99212 Obesity complicating pregnancy, second trimester: Secondary | ICD-10-CM | POA: Insufficient documentation

## 2023-07-23 DIAGNOSIS — Z794 Long term (current) use of insulin: Secondary | ICD-10-CM | POA: Insufficient documentation

## 2023-07-23 DIAGNOSIS — O99012 Anemia complicating pregnancy, second trimester: Secondary | ICD-10-CM | POA: Insufficient documentation

## 2023-07-23 DIAGNOSIS — O0990 Supervision of high risk pregnancy, unspecified, unspecified trimester: Secondary | ICD-10-CM

## 2023-07-23 DIAGNOSIS — F1290 Cannabis use, unspecified, uncomplicated: Secondary | ICD-10-CM

## 2023-07-23 DIAGNOSIS — O24112 Pre-existing diabetes mellitus, type 2, in pregnancy, second trimester: Secondary | ICD-10-CM | POA: Diagnosis not present

## 2023-07-23 DIAGNOSIS — O10919 Unspecified pre-existing hypertension complicating pregnancy, unspecified trimester: Secondary | ICD-10-CM | POA: Diagnosis present

## 2023-07-23 DIAGNOSIS — O99322 Drug use complicating pregnancy, second trimester: Secondary | ICD-10-CM | POA: Insufficient documentation

## 2023-08-07 ENCOUNTER — Ambulatory Visit: Payer: 59 | Admitting: Obstetrics and Gynecology

## 2023-08-07 ENCOUNTER — Other Ambulatory Visit (HOSPITAL_COMMUNITY)
Admission: RE | Admit: 2023-08-07 | Discharge: 2023-08-07 | Disposition: A | Discharge status: Home / Self Care | Payer: 59 | Source: Ambulatory Visit | Attending: Obstetrics and Gynecology | Admitting: Obstetrics and Gynecology

## 2023-08-07 VITALS — BP 128/73 | HR 79 | Wt 230.0 lb

## 2023-08-07 DIAGNOSIS — O24112 Pre-existing diabetes mellitus, type 2, in pregnancy, second trimester: Secondary | ICD-10-CM

## 2023-08-07 DIAGNOSIS — A599 Trichomoniasis, unspecified: Secondary | ICD-10-CM | POA: Insufficient documentation

## 2023-08-07 DIAGNOSIS — O24911 Unspecified diabetes mellitus in pregnancy, first trimester: Secondary | ICD-10-CM

## 2023-08-07 DIAGNOSIS — O219 Vomiting of pregnancy, unspecified: Secondary | ICD-10-CM

## 2023-08-07 DIAGNOSIS — E669 Obesity, unspecified: Secondary | ICD-10-CM

## 2023-08-07 DIAGNOSIS — O23592 Infection of other part of genital tract in pregnancy, second trimester: Secondary | ICD-10-CM

## 2023-08-07 DIAGNOSIS — O10012 Pre-existing essential hypertension complicating pregnancy, second trimester: Secondary | ICD-10-CM

## 2023-08-07 DIAGNOSIS — O0992 Supervision of high risk pregnancy, unspecified, second trimester: Secondary | ICD-10-CM

## 2023-08-07 DIAGNOSIS — O99210 Obesity complicating pregnancy, unspecified trimester: Secondary | ICD-10-CM

## 2023-08-07 DIAGNOSIS — O98312 Other infections with a predominantly sexual mode of transmission complicating pregnancy, second trimester: Secondary | ICD-10-CM

## 2023-08-07 DIAGNOSIS — I10 Essential (primary) hypertension: Secondary | ICD-10-CM

## 2023-08-07 DIAGNOSIS — N898 Other specified noninflammatory disorders of vagina: Secondary | ICD-10-CM

## 2023-08-07 DIAGNOSIS — Z3A25 25 weeks gestation of pregnancy: Secondary | ICD-10-CM

## 2023-08-07 DIAGNOSIS — Z6841 Body Mass Index (BMI) 40.0 and over, adult: Secondary | ICD-10-CM

## 2023-08-07 DIAGNOSIS — O212 Late vomiting of pregnancy: Secondary | ICD-10-CM

## 2023-08-07 DIAGNOSIS — R110 Nausea: Secondary | ICD-10-CM

## 2023-08-07 DIAGNOSIS — O99212 Obesity complicating pregnancy, second trimester: Secondary | ICD-10-CM

## 2023-08-07 MED ORDER — ONDANSETRON HCL 4 MG PO TABS: 4.0000 mg | ORAL_TABLET | Freq: Three times a day (TID) | ORAL | 0 refills | Status: DC | PRN

## 2023-08-07 MED ORDER — ACETAMINOPHEN 500 MG PO TABS: 500.0000 mg | ORAL_TABLET | Freq: Four times a day (QID) | ORAL | 0 refills | Status: DC | PRN

## 2023-08-07 NOTE — Progress Notes (Signed)
Pt did not bring glucose log

## 2023-08-07 NOTE — Progress Notes (Addendum)
PRENATAL VISIT NOTE  Subjective:  Misty [name] is a 32 y.o. [phone] at [redacted]w[redacted]d being seen today for ongoing prenatal care.  She is currently monitored for the following issues for this high-risk pregnancy and has Marijuana use; Anemia in pregnancy; Chronic hypertension; Type 2 diabetes mellitus (HCC); Trichomoniasis; Diabetes mellitus affecting pregnancy in first trimester; Nausea and vomiting in pregnancy; Subchorionic hemorrhage in first trimester; Supervision of high risk pregnancy, antepartum; History of preterm delivery; and Obesity affecting pregnancy, antepartum on their problem list.  Patient reports no complaints.  Contractions: Irritability. Vag. Bleeding: None.  Movement: Present. Denies leaking of fluid.   The following portions of the patient's history were reviewed and updated as appropriate: allergies, current medications, past family history, past medical history, past social history, past surgical history and problem list.   Objective:   Vitals:   08/07/23 1504  BP: 128/73  Pulse: 79  Weight: 230 lb (104.3 kg)    Fetal Status: Fetal Heart Rate (bpm): 162   Movement: Present     General:  Alert, oriented and cooperative. Patient is in no acute distress.  Skin: Skin is warm and dry. No rash noted.   Cardiovascular: Normal heart rate noted  Respiratory: Normal respiratory effort, no problems with respiration noted  Abdomen: Soft, gravid, appropriate for gestational age.  Pain/Pressure: Present     Pelvic: Cervical exam deferred        Extremities: Normal range of motion.  Edema: Trace  Mental Status: Normal mood and affect. Normal behavior. Normal judgment and thought content.   Assessment and Plan:  Pregnancy: [id number] at [redacted]w[redacted]d 1. Trichomonal infection Toc neg a month ago but with repeat vaginal discharge. Repeat testing today - Cervicovaginal ancillary only( [place])  2. BMI 40.0-44.9, adult (HCC) -6lbs from last visit on 11/5. Continue to follow. Patient  with stable nausea. Zofran refill given  3. Obesity in pregnancy, antepartum  4. Supervision of high risk pregnancy in second trimester Continue on low dose asa Can do 28wk labs next visit Patient with chronic low back pain. I told her I recommend using the tylenol sparingly and other  - ondansetron (ZOFRAN) 4 MG tablet; Take 1 tablet (4 mg total) by mouth every 8 (eight) hours as needed for nausea or vomiting.  Dispense: 30 tablet; Refill: 0  5. Nausea - ondansetron (ZOFRAN) 4 MG tablet; Take 1 tablet (4 mg total) by mouth every 8 (eight) hours as needed for nausea or vomiting.  Dispense: 30 tablet; Refill: 0  6. [redacted] weeks gestation of pregnancy  7. Nausea and vomiting in pregnancy  8. Chronic hypertension Confirms on labetalol 100 bid and procardia 60 bid  9. Trichomoniasis  10. Vaginal discharge  11. Diabetes mellitus affecting pregnancy in first trimester Confirms on glargine 18 with breakfast and 18 around 1400. She is checking her cbgs in the morning fasting and it is around 80s-90s. Pt sometimes checking before lunch or dinner. I d/w her re: 2h post prandial checks and numbers to be aware of; patient instructions updated Continue with qmonth growth u/s and start weekly testing at 32wks.  11/19: 39%, ac 51%, afi wnl. -fetal echo wnl on 11/4  Preterm labor symptoms and general obstetric precautions including but not limited to vaginal bleeding, contractions, leaking of fluid and fetal movement were reviewed in detail with the patient. Please refer to After Visit Summary for other counseling recommendations.   Return in about 2 weeks (around 08/21/2023).  Future Appointments  Date Time Provider Department Center  08/07/2023  3:55 PM Cedro Bing, MD [facility]  08/21/2023 10:15 AM [facility] NURSE [facility]  08/21/2023 10:30 AM [facility] US6 [facility]    [place] Bing, MD

## 2023-08-07 NOTE — Patient Instructions (Signed)
Continue checking your sugar first thing in the morning before eating or drinking; you want this number to be between 75-100  Start checking your sugar two hours after the start of each meal; you want this to be less than 120

## 2023-08-09 LAB — CERVICOVAGINAL ANCILLARY ONLY
Comment: NEGATIVE
Trichomonas: POSITIVE — AB

## 2023-08-10 ENCOUNTER — Encounter: Payer: Self-pay | Admitting: Obstetrics and Gynecology

## 2023-08-12 ENCOUNTER — Other Ambulatory Visit: Payer: Self-pay | Admitting: Lactation Services

## 2023-08-12 MED ORDER — METRONIDAZOLE 500 MG PO TABS: 500.0000 mg | ORAL_TABLET | Freq: Two times a day (BID) | ORAL | 0 refills | Status: DC

## 2023-08-12 NOTE — Addendum Note (Signed)
Addended by: Nokomis Bing on: 08/12/2023 06:46 PM   Modules accepted: Orders

## 2023-08-12 NOTE — Progress Notes (Signed)
Flagyl ordered per standing order for + Trich. Patient informed via MyChart.

## 2023-08-21 ENCOUNTER — Ambulatory Visit: Payer: 59 | Admitting: *Deleted

## 2023-08-21 ENCOUNTER — Ambulatory Visit: Payer: 59 | Attending: Obstetrics and Gynecology

## 2023-08-21 ENCOUNTER — Other Ambulatory Visit: Payer: Self-pay

## 2023-08-21 ENCOUNTER — Other Ambulatory Visit: Payer: Self-pay | Admitting: *Deleted

## 2023-08-21 VITALS — BP 137/70 | HR 89

## 2023-08-21 DIAGNOSIS — Z3A27 27 weeks gestation of pregnancy: Secondary | ICD-10-CM

## 2023-08-21 DIAGNOSIS — E1169 Type 2 diabetes mellitus with other specified complication: Secondary | ICD-10-CM

## 2023-08-21 DIAGNOSIS — O99212 Obesity complicating pregnancy, second trimester: Secondary | ICD-10-CM

## 2023-08-21 DIAGNOSIS — O10012 Pre-existing essential hypertension complicating pregnancy, second trimester: Secondary | ICD-10-CM

## 2023-08-21 DIAGNOSIS — O0990 Supervision of high risk pregnancy, unspecified, unspecified trimester: Secondary | ICD-10-CM | POA: Diagnosis present

## 2023-08-21 DIAGNOSIS — O24112 Pre-existing diabetes mellitus, type 2, in pregnancy, second trimester: Secondary | ICD-10-CM

## 2023-08-21 DIAGNOSIS — Z794 Long term (current) use of insulin: Secondary | ICD-10-CM | POA: Diagnosis not present

## 2023-08-21 DIAGNOSIS — E669 Obesity, unspecified: Secondary | ICD-10-CM

## 2023-08-21 DIAGNOSIS — O10912 Unspecified pre-existing hypertension complicating pregnancy, second trimester: Secondary | ICD-10-CM

## 2023-08-23 ENCOUNTER — Telehealth: Payer: Self-pay | Admitting: *Deleted

## 2023-08-23 ENCOUNTER — Encounter: Payer: 59 | Admitting: Family Medicine

## 2023-08-23 NOTE — Telephone Encounter (Addendum)
I called patient re: her MyChart message about appointment and c/o contractions and can't keep things down 2 days ago. She reports she feels better today and talked with Dr. At [place] 08/21/23. She states she  notices pain/contractions mostly at night but none in daytime. We discussed may be round ligament pain and advised change posititions often, use pillows, tylenol as needed , heating pad to back or hip 20 minutes at a time but not to abdomen over baby. I also advised to talk with provider at next ob fu and they may consider PT.  I also advised PTL precautions. She voices understanding.I also transferred to front desk to reschedule ob fu she cancelled. Nancy Fetter

## 2023-09-12 ENCOUNTER — Telehealth: Payer: Self-pay | Admitting: Family Medicine

## 2023-09-12 DIAGNOSIS — M549 Dorsalgia, unspecified: Secondary | ICD-10-CM

## 2023-09-12 DIAGNOSIS — O0990 Supervision of high risk pregnancy, unspecified, unspecified trimester: Secondary | ICD-10-CM

## 2023-09-12 MED ORDER — ACETAMINOPHEN 500 MG PO TABS: 500.0000 mg | ORAL_TABLET | Freq: Four times a day (QID) | ORAL | 0 refills | Status: DC | PRN

## 2023-09-12 NOTE — Telephone Encounter (Signed)
 Per chart review original rx sent in by Dr. Izell 08/07/23 and to use sparingly. I called patient and she reports she had contacted pharmacy for refill and they said would need to contact pharmacy. I informed her we can send in one refill and to continue to use sparingly as Dr. Izell recommended. She voices understanding. Rock Skip PEAK

## 2023-09-12 NOTE — Addendum Note (Signed)
 Addended by: Gerome Apley on: 09/12/2023 02:34 PM   Modules accepted: Orders

## 2023-09-12 NOTE — Telephone Encounter (Signed)
 Patient calling about a medication refill. Her pharmacy on [address] says they need to get in contact with the office in order to refill her medicine acetaminophen (TYLENOL) .

## 2023-09-15 ENCOUNTER — Inpatient Hospital Stay (HOSPITAL_COMMUNITY)
Admission: AD | Admit: 2023-09-15 | Discharge: 2023-09-15 | Disposition: A | Discharge status: Home / Self Care | Payer: 59 | Attending: Obstetrics and Gynecology | Admitting: Obstetrics and Gynecology

## 2023-09-15 ENCOUNTER — Other Ambulatory Visit: Payer: Self-pay

## 2023-09-15 ENCOUNTER — Encounter (HOSPITAL_COMMUNITY): Payer: Self-pay | Admitting: Obstetrics and Gynecology

## 2023-09-15 DIAGNOSIS — O10913 Unspecified pre-existing hypertension complicating pregnancy, third trimester: Secondary | ICD-10-CM | POA: Diagnosis not present

## 2023-09-15 DIAGNOSIS — O4693 Antepartum hemorrhage, unspecified, third trimester: Secondary | ICD-10-CM | POA: Diagnosis present

## 2023-09-15 DIAGNOSIS — Z87891 Personal history of nicotine dependence: Secondary | ICD-10-CM | POA: Insufficient documentation

## 2023-09-15 DIAGNOSIS — N86 Erosion and ectropion of cervix uteri: Secondary | ICD-10-CM

## 2023-09-15 DIAGNOSIS — N898 Other specified noninflammatory disorders of vagina: Secondary | ICD-10-CM

## 2023-09-15 DIAGNOSIS — O26893 Other specified pregnancy related conditions, third trimester: Secondary | ICD-10-CM

## 2023-09-15 DIAGNOSIS — O24113 Pre-existing diabetes mellitus, type 2, in pregnancy, third trimester: Secondary | ICD-10-CM | POA: Diagnosis not present

## 2023-09-15 DIAGNOSIS — O99513 Diseases of the respiratory system complicating pregnancy, third trimester: Secondary | ICD-10-CM | POA: Insufficient documentation

## 2023-09-15 DIAGNOSIS — Z3A31 31 weeks gestation of pregnancy: Secondary | ICD-10-CM | POA: Diagnosis not present

## 2023-09-15 DIAGNOSIS — O4703 False labor before 37 completed weeks of gestation, third trimester: Secondary | ICD-10-CM | POA: Diagnosis not present

## 2023-09-15 DIAGNOSIS — R109 Unspecified abdominal pain: Secondary | ICD-10-CM | POA: Diagnosis present

## 2023-09-15 LAB — WET PREP, GENITAL
Clue Cells Wet Prep HPF POC: NONE SEEN
Sperm: NONE SEEN
Trich, Wet Prep: NONE SEEN
WBC, Wet Prep HPF POC: >=10 — AB (ref <10)
Yeast Wet Prep HPF POC: NONE SEEN

## 2023-09-15 LAB — URINALYSIS, ROUTINE W REFLEX MICROSCOPIC
Bilirubin Urine: NEGATIVE
Glucose, UA: NEGATIVE mg/dL
Ketones, ur: NEGATIVE mg/dL
Nitrite: NEGATIVE
Protein, ur: NEGATIVE mg/dL
Specific Gravity, Urine: 1.006 (ref 1.005–1.030)
WBC, UA: >50 WBC/hpf (ref 0–5)
pH: 6 (ref 5.0–8.0)

## 2023-09-15 MED ORDER — AZITHROMYCIN 250 MG PO TABS
1000.0000 mg | ORAL_TABLET | Freq: Once | ORAL | Status: AC
  Administered 2023-09-15: 1000 mg via ORAL
  Filled 2023-09-15: qty 4

## 2023-09-15 NOTE — MAU Provider Note (Signed)
 Chief Complaint:  Vaginal Bleeding and Abdominal Pain  HPI  HPI: Misty [name] is a 33 y.o. [phone] at 69w0dwho presents to maternity admissions reporting bright red blood in the toilet at 1820. Not wearing a pad. Hx of trichomonas. White vaginal discharge and itching. Braxton hicks every 10-15 minutes 5/10. No intercourse in months. Endorses masturbation with long false nails.   She reports good fetal movement, denies LOF, vaginal bleeding, vaginal itching/burning, urinary symptoms, h/a, dizziness, n/v, diarrhea, constipation or fever/chills.  She denies headache, visual changes or RUQ abdominal pain.   Past Medical History: Past Medical History:  Diagnosis Date   Abscess 09/02/2020   Allergy    Anemia 2015   Asthma    as child   Chronic hypertension with superimposed preeclampsia 05/24/2021   Diabetes mellitus without complication (HCC) 2015   DKA (diabetic ketoacidosis) (HCC) 09/02/2020   Eczema    Hypertension 09/2013   Trichomonas infection 04/2023   UTI (urinary tract infection)     Past obstetric history: OB History  Gravida Para Term Preterm AB Living  4 1 0 1 2 1   SAB IAB Ectopic Multiple Live Births  2 0 0 0 1    # Outcome Date GA Lbr Len/2nd Weight Sex Type Anes PTL Lv  4 Current           3 SAB 09/05/22          2 Preterm 06/22/21 [redacted]w[redacted]d 14:20 / 00:53 2100 g F Vag-Spont EPI  LIV  1 SAB             Past Surgical History: Past Surgical History:  Procedure Laterality Date   Abcess on back     NO PAST SURGERIES      Family History: Family History  Problem Relation Age of Onset   Migraines Mother    Hypertension Mother    Heart disease Mother 13       CHF   Diabetes Mother    Healthy Father    Sickle cell anemia Brother    Migraines Maternal Grandmother    Diabetes Maternal Grandmother    Heart disease Maternal Grandmother    Hypertension Maternal Grandfather    Diabetes Maternal Grandfather    Heart disease Maternal Grandfather    Asthma Neg Hx     Cancer Neg Hx     Social History: Social History   Tobacco Use   Smoking status: Former    Current packs/day: 0.00    Average packs/day: 0.5 packs/day for 2.0 years (1.0 ttl pk-yrs)    Types: Cigarettes    Start date: 09/09/2011    Quit date: 09/08/2013    Years since quitting: 10.0   Smokeless tobacco: Never  Vaping Use   Vaping status: Never Used  Substance Use Topics   Alcohol use: No    Alcohol/week: 0.0 standard drinks of alcohol   Drug use: No    Allergies:  Allergies  Allergen Reactions   Iodides Anaphylaxis and Itching   Morphine  And Codeine Anaphylaxis and Itching   Shellfish Allergy Anaphylaxis and Itching    Pt reports watery eyes   Ultram  [Tramadol  Hcl] Hives    Hives and swollen lips     Meds:  No medications prior to admission.    I have reviewed patient's Past Medical Hx, Surgical Hx, Family Hx, Social Hx, medications and allergies.   ROS:  Review of Systems Other systems negative  Physical Exam  Patient Vitals for the past 24 hrs:  BP  Temp Temp src Pulse Resp SpO2 Height Weight  09/15/23 2112 (!) 145/70 -- -- 76 -- -- -- --  09/15/23 2000 124/64 -- -- 89 -- 99 % -- --  09/15/23 1945 129/64 -- -- 86 -- 100 % -- --  09/15/23 1930 130/64 -- -- 85 -- 99 % -- --  09/15/23 1915 (!) 140/68 -- -- 75 -- 100 % -- --  09/15/23 1900 (!) 143/73 -- -- 84 -- 100 % -- --  09/15/23 1855 (!) 149/78 98.6 F (37 C) Oral 77 18 100 % -- --  09/15/23 1853 -- -- -- -- -- -- 5' 3 (1.6 m) 105.2 kg   Constitutional: Well-developed, well-nourished female in no acute distress.  Cardiovascular: normal rate and rhythm Respiratory: normal effort, clear to auscultation bilaterally GI: Abd soft, non-tender, gravid appropriate for gestational age. No rebound or guarding. MS: Extremities nontender, no edema, normal ROM Neurologic: Alert and oriented x 4.  GU: Neg CVAT.  PELVIC EXAM: Cervix pink with 2 cm ulcerated friable lesion, visually closed, copious tan watery  discharge, vaginal walls with non-bleeding right sided erosion and external genitalia normal with tan discharge from introitus. Cervical motion tenderness.     FHT:  Baseline 155, moderate variability, accelerations present, no decelerations Contractions: Irritability   Labs: Results for orders placed or performed during the hospital encounter of 09/15/23 (from the past 24 hours)  Wet prep, genital     Status: Abnormal   Collection Time: 09/15/23  8:08 PM   Specimen: PATH Cytology Cervicovaginal Ancillary Only  Result Value Ref Range   Yeast Wet Prep HPF POC NONE SEEN NONE SEEN   Trich, Wet Prep NONE SEEN NONE SEEN   Clue Cells Wet Prep HPF POC NONE SEEN NONE SEEN   WBC, Wet Prep HPF POC >=10 (A) <10   Sperm NONE SEEN   Urinalysis, Routine w reflex microscopic -Urine, Clean Catch     Status: Abnormal   Collection Time: 09/15/23  8:08 PM  Result Value Ref Range   Color, Urine YELLOW YELLOW   APPearance HAZY (A) CLEAR   Specific Gravity, Urine 1.006 1.005 - 1.030   pH 6.0 5.0 - 8.0   Glucose, UA NEGATIVE NEGATIVE mg/dL   Hgb urine dipstick MODERATE (A) NEGATIVE   Bilirubin Urine NEGATIVE NEGATIVE   Ketones, ur NEGATIVE NEGATIVE mg/dL   Protein, ur NEGATIVE NEGATIVE mg/dL   Nitrite NEGATIVE NEGATIVE   Leukocytes,Ua LARGE (A) NEGATIVE   RBC / HPF 11-20 0 - 5 RBC/hpf   WBC, UA >50 0 - 5 WBC/hpf   Bacteria, UA RARE (A) NONE SEEN   Squamous Epithelial / HPF 0-5 0 - 5 /HPF   Mucus PRESENT    O/Positive/-- (08/28 1137)  Imaging:  US  MFM OB FOLLOW UP Result Date: 08/21/2023 ----------------------------------------------------------------------  OBSTETRICS REPORT                       (Signed Final 08/21/2023 01:35 pm) ---------------------------------------------------------------------- Patient Info  ID #:       [id number]                          D.O.B.:  [birth year] (32 yrs)(F)  Name:       Charles I Kuch                Visit Date: 08/21/2023 10:13 am  ---------------------------------------------------------------------- Performed By  Attending:        Steffan Keys MD  Secondary Phy.:   [facility]                                                             [facility]  Performed By:     Joyann Allen RDMS      Address:          [address]                                                             [place], [place]                                                             [address]  Referred By:      BEBE                Location:         Center for Karna FURRY MD                               [facility]                                                             [facility]                                                             [facility]  Ref. Address:     [address]                    [place], [place]                    [address] ---------------------------------------------------------------------- Orders  #  Description                           Code        Ordered By  1  US  MFM OB FOLLOW UP                   23183.98    FREDIA FRESH ----------------------------------------------------------------------  #  Order #                     Accession #  Episode #  1  [id number]                   [id number]                 [id number] ---------------------------------------------------------------------- Indications  Diabetes - Pregestational, 2nd trimester       O24.312  (Insulin )  Hypertension - Chronic/Pre-existing            O10.019  Obesity complicating pregnancy, second         O99.212  trimester (BMI 40)  Drug use complicating pregnancy, second        O99.322  trimester  Anemia during pregnancy in second trimester    O99.012  Poor obstetric history: Previous preterm       O09.219  delivery, antepartum  Encounter for other antenatal screening        Z36.2  follow-up  [redacted] weeks gestation of pregnancy                Z3A.27 ---------------------------------------------------------------------- Vital Signs  BP:           137/70 ---------------------------------------------------------------------- Fetal Evaluation  Num Of Fetuses:         1  Fetal Heart Rate(bpm):  138  Cardiac Activity:       Observed  Presentation:           Cephalic  Placenta:               Anterior  P. Cord Insertion:      Previously seen  Amniotic Fluid  AFI FV:      Within normal limits                              Largest Pocket(cm)                              5.97 ---------------------------------------------------------------------- Biometry  BPD:        68  mm     G. Age:  27w 3d         36  %    CI:        74.58   %    70 - 86                                                          FL/HC:      20.0   %    18.6 - 20.4  HC:      249.9  mm     G. Age:  27w 1d         14  %    HC/AC:      1.10        1.05 - 1.21  AC:      226.6  mm     G. Age:  27w 0d         30  %    FL/BPD:     73.5   %    71 - 87  FL:         50  mm     G. Age:  26w 6d         21  %  FL/AC:      22.1   %    20 - 24  LV:        2.9  mm  Est. FW:    1016  gm      2 lb 4 oz     23  % ---------------------------------------------------------------------- OB History  Blood Type:   O+ ---------------------------------------------------------------------- Gestational Age  LMP:           27w 3d        Date:  02/10/23                   EDD:   11/17/23  U/S Today:     27w 1d                                        EDD:   11/19/23  Best:          27w 3d     Det. By:  LMP  (02/10/23)          EDD:   11/17/23 ---------------------------------------------------------------------- Anatomy  Cranium:               Previously seen        Aortic Arch:            Previously seen  Cavum:                 Previously seen        Ductal Arch:            Previously seen  Ventricles:            Appears normal         Diaphragm:              Appears normal  Choroid Plexus:        Previously seen        Stomach:                Appears normal, left                                                                         sided  Cerebellum:            Previously seen        Abdomen:                Previously seen  Posterior Fossa:       Previously seen        Abdominal Wall:         Previously seen  Face:                  Orbits and profile     Cord Vessels:           Previously seen                         previously seen  Lips:                  Previously seen        Kidneys:  Appear normal  Thoracic:              Previously seen        Bladder:                Appears normal  Heart:                 Appears normal         Spine:                  Previously seen                         (4CH, axis, and                         situs)  RVOT:                  Previously seen        Upper Extremities:      Previously seen  LVOT:                  Previously seen        Lower Extremities:      Previously seen ---------------------------------------------------------------------- Cervix Uterus Adnexa  Cervix  Not visualized (advanced GA >24wks)  Uterus  No abnormality visualized.  Right Ovary  Not visualized.  Left Ovary  Not visualized.  Cul De Sac  No free fluid seen.  Adnexa  No abnormality visualized ---------------------------------------------------------------------- Comments  This patient was seen for a follow up growth scan due to  maternal obesity with a BMI of 40, pregestational diabetes  treated with insulin , and chronic hypertension treated with  nifedipine .  She denies any problems since her last exam.  She had a normal fetal echocardiogram performed with [facility]  pediatric cardiology.  She was informed that the fetal growth and amniotic fluid  level appears appropriate for her gestational age.  Due to her underlying medical conditions, we will start weekly  fetal testing at 32 weeks.  She will return in 5 weeks for a growth scan and BPP. ----------------------------------------------------------------------                   Steffan Keys, MD Electronically Signed Final Report   08/21/2023 01:35 pm  ----------------------------------------------------------------------    MAU Course/MDM: I have reviewed the triage vital signs and the nursing notes.   Pertinent labs & imaging results that were available during my care of the patient were reviewed by me and considered in my medical decision making (see chart for details).      I have reviewed her medical records including past results, notes and treatments.   I have ordered labs and reviewed results.  NST reviewed  Treatments in MAU included SSE, Wet prep, GC/Chlamydia, UA.    Assessment: 1. [redacted] weeks gestation of pregnancy   2. Vaginal discharge   3. Cervical ulceration   Painful cervical ulceration with copious tan watery discharge.  Wet prep negative GC/Chlamydia pending; follow upon results UA: moderate blood, and WBC suspect from vaginal discharge  Can not r/o Haemophilus ducreyi; will treat with single dose 1g Azithromycin  PO given low risk high reward if H. Ducreyi given abnormal exam and painful lesion. No LAD.  - Will need repeat pelvic exam in 1 week  Reactive NST.  Closed cervix, no contractions on monitor. Low suspicion for PTL.   Plan: Discharge home Preterm labor precautions and  fetal kick counts provided Follow up in Office for prenatal visits and recheck  Follow-up Information     Center for [facility] Follow up.   Specialty: Obstetrics and Gynecology Why: Call to make an appointment for repeat pelvic exam within 1 week Contact information: [address] [place]  [address] [phone]               Pt stable at time of discharge.  Mardy Shropshire, MD FMOB Fellow, Faculty practice [facility], Center for [facility]  09/15/2023 11:32 PM

## 2023-09-15 NOTE — MAU Note (Addendum)
 Misty [name] is a 33 y.o. at [redacted]w[redacted]d here in MAU reporting: went to the bathroom at 1820 and saw bright red blood in the toilet and decided to come straight here. Not wearing a pad currently. Pt states she has had trich in the past and feels like she still has it. States she has white discharge and vaginal itching. Denies any odor.  Reports braxton hicks every 10-15 minutes. +FM States she has not had any sexual intercourse in months.  Onset of complaint: 1820 Pain score: 5 low mid abdomen  Vitals:   09/15/23 1855 09/15/23 1900  BP: (!) 149/78 (!) 143/73  Pulse: 77 84  Resp: 18   Temp: 98.6 F (37 C)   SpO2: 100% 100%     FHT:155 Lab orders placed from triage:

## 2023-09-16 LAB — GC/CHLAMYDIA PROBE AMP (~~LOC~~) NOT AT ARMC
Chlamydia: NEGATIVE
Comment: NEGATIVE
Comment: NORMAL
Neisseria Gonorrhea: NEGATIVE

## 2023-09-23 ENCOUNTER — Telehealth: Payer: Self-pay

## 2023-09-23 NOTE — Telephone Encounter (Addendum)
Appointment Request From: [name]   With Provider: [facility]]   Preferred Date Range: 09/23/2023 - 09/24/2023   Preferred Times: Any Time   Reason for visit: Office Visit   Health Maintenance Topic:    Comments: I'm having contractions in my belly every 5 minutes when I stand up it feels like my baby is coming out I have an appointment Tuesday I went to the hospital I think on the 14th what should I do   ----------------------------  Called pt regarding above appt request. Pt reports she did not go to work this morning due to contractions and pelvic pressure. She continues to have contractions every 5 minutes. Denies any leaking fluid or vaginal bleeding. I recommended patient go immediately to MAU for evaluation. Pt reports not having anyone to watch 33 year old. I discussed with patient if she has any family or friends in the area; she states they are all working. I explained this is an emergency and encouraged her to call and ask if someone can watch her daughter. Also reviewed patient's plan for childcare during birth. She plans for a friend who lives in [place], [place] to watch 63 year old. Encouraged pt to call her friend to ask her to meet at the hospital to take over caring for 33 year old while she is evaluated for preterm labor. Explained my concern is that she is in preterm labor and needs to be seen prior to appt tomorrow. Pt verbalizes understanding of my concern.

## 2023-09-24 ENCOUNTER — Ambulatory Visit: Payer: 59 | Admitting: *Deleted

## 2023-09-24 ENCOUNTER — Encounter: Payer: Self-pay | Admitting: *Deleted

## 2023-09-24 ENCOUNTER — Ambulatory Visit: Payer: 59 | Attending: Obstetrics

## 2023-09-24 ENCOUNTER — Other Ambulatory Visit: Payer: Self-pay | Admitting: *Deleted

## 2023-09-24 ENCOUNTER — Other Ambulatory Visit: Payer: Self-pay

## 2023-09-24 VITALS — BP 135/82 | HR 99

## 2023-09-24 VITALS — BP 142/82 | HR 87

## 2023-09-24 DIAGNOSIS — Z794 Long term (current) use of insulin: Secondary | ICD-10-CM

## 2023-09-24 DIAGNOSIS — O09213 Supervision of pregnancy with history of pre-term labor, third trimester: Secondary | ICD-10-CM

## 2023-09-24 DIAGNOSIS — O10912 Unspecified pre-existing hypertension complicating pregnancy, second trimester: Secondary | ICD-10-CM | POA: Insufficient documentation

## 2023-09-24 DIAGNOSIS — Z3A32 32 weeks gestation of pregnancy: Secondary | ICD-10-CM

## 2023-09-24 DIAGNOSIS — O24112 Pre-existing diabetes mellitus, type 2, in pregnancy, second trimester: Secondary | ICD-10-CM | POA: Diagnosis not present

## 2023-09-24 DIAGNOSIS — O24119 Pre-existing diabetes mellitus, type 2, in pregnancy, unspecified trimester: Secondary | ICD-10-CM

## 2023-09-24 DIAGNOSIS — E119 Type 2 diabetes mellitus without complications: Secondary | ICD-10-CM | POA: Diagnosis not present

## 2023-09-24 DIAGNOSIS — O99213 Obesity complicating pregnancy, third trimester: Secondary | ICD-10-CM

## 2023-09-24 DIAGNOSIS — O0990 Supervision of high risk pregnancy, unspecified, unspecified trimester: Secondary | ICD-10-CM

## 2023-09-24 DIAGNOSIS — D649 Anemia, unspecified: Secondary | ICD-10-CM

## 2023-09-24 DIAGNOSIS — E669 Obesity, unspecified: Secondary | ICD-10-CM

## 2023-09-24 DIAGNOSIS — F99 Mental disorder, not otherwise specified: Secondary | ICD-10-CM

## 2023-09-24 DIAGNOSIS — O24113 Pre-existing diabetes mellitus, type 2, in pregnancy, third trimester: Secondary | ICD-10-CM | POA: Diagnosis not present

## 2023-09-24 DIAGNOSIS — O99212 Obesity complicating pregnancy, second trimester: Secondary | ICD-10-CM | POA: Diagnosis not present

## 2023-09-24 DIAGNOSIS — O99012 Anemia complicating pregnancy, second trimester: Secondary | ICD-10-CM

## 2023-09-24 DIAGNOSIS — O10013 Pre-existing essential hypertension complicating pregnancy, third trimester: Secondary | ICD-10-CM

## 2023-09-24 DIAGNOSIS — O99323 Drug use complicating pregnancy, third trimester: Secondary | ICD-10-CM

## 2023-10-01 ENCOUNTER — Ambulatory Visit: Payer: 59

## 2023-10-01 ENCOUNTER — Inpatient Hospital Stay (HOSPITAL_COMMUNITY)
Admission: AD | Admit: 2023-10-01 | Discharge: 2023-10-01 | Disposition: A | Discharge status: Home / Self Care | Payer: 59 | Attending: Obstetrics and Gynecology | Admitting: Obstetrics and Gynecology

## 2023-10-01 ENCOUNTER — Encounter: Payer: 59 | Admitting: Family Medicine

## 2023-10-01 ENCOUNTER — Encounter (HOSPITAL_COMMUNITY): Payer: Self-pay | Admitting: Obstetrics and Gynecology

## 2023-10-01 DIAGNOSIS — O219 Vomiting of pregnancy, unspecified: Secondary | ICD-10-CM

## 2023-10-01 DIAGNOSIS — O24113 Pre-existing diabetes mellitus, type 2, in pregnancy, third trimester: Secondary | ICD-10-CM | POA: Diagnosis not present

## 2023-10-01 DIAGNOSIS — O10913 Unspecified pre-existing hypertension complicating pregnancy, third trimester: Secondary | ICD-10-CM | POA: Diagnosis not present

## 2023-10-01 DIAGNOSIS — O26893 Other specified pregnancy related conditions, third trimester: Secondary | ICD-10-CM | POA: Insufficient documentation

## 2023-10-01 DIAGNOSIS — O212 Late vomiting of pregnancy: Secondary | ICD-10-CM | POA: Diagnosis not present

## 2023-10-01 DIAGNOSIS — Z3A33 33 weeks gestation of pregnancy: Secondary | ICD-10-CM | POA: Diagnosis not present

## 2023-10-01 LAB — CBC
HCT: 32.4 % — ABNORMAL LOW (ref 36.0–46.0)
Hemoglobin: 11 g/dL — ABNORMAL LOW (ref 12.0–15.0)
MCH: 31.7 pg (ref 26.0–34.0)
MCHC: 34 g/dL (ref 30.0–36.0)
MCV: 93.4 fL (ref 80.0–100.0)
Platelets: 365 10*3/uL (ref 150–400)
RBC: 3.47 MIL/uL — ABNORMAL LOW (ref 3.87–5.11)
RDW: 12.9 % (ref 11.5–15.5)
WBC: 6.2 10*3/uL (ref 4.0–10.5)
nRBC: 0 % (ref 0.0–0.2)

## 2023-10-01 LAB — COMPREHENSIVE METABOLIC PANEL
ALT: 15 U/L (ref 0–44)
AST: 19 U/L (ref 15–41)
Albumin: 2.6 g/dL — ABNORMAL LOW (ref 3.5–5.0)
Alkaline Phosphatase: 105 U/L (ref 38–126)
Anion gap: 9 (ref 5–15)
BUN: <5 mg/dL — ABNORMAL LOW (ref 6–20)
CO2: 21 mmol/L — ABNORMAL LOW (ref 22–32)
Calcium: 8.9 mg/dL (ref 8.9–10.3)
Chloride: 105 mmol/L (ref 98–111)
Creatinine, Ser: 0.7 mg/dL (ref 0.44–1.00)
GFR, Estimated: >60 mL/min (ref >60)
Glucose, Bld: 108 mg/dL — ABNORMAL HIGH (ref 70–99)
Potassium: 3.7 mmol/L (ref 3.5–5.1)
Sodium: 135 mmol/L (ref 135–145)
Total Bilirubin: 0.8 mg/dL (ref 0.0–1.2)
Total Protein: 6.8 g/dL (ref 6.5–8.1)

## 2023-10-01 LAB — GLUCOSE, CAPILLARY: Glucose-Capillary: 111 mg/dL — ABNORMAL HIGH (ref 70–99)

## 2023-10-01 MED ORDER — METOCLOPRAMIDE HCL 10 MG PO TABS
10.0000 mg | ORAL_TABLET | Freq: Once | ORAL | Status: AC
  Administered 2023-10-01: 10 mg via ORAL
  Filled 2023-10-01: qty 1

## 2023-10-01 MED ORDER — METOCLOPRAMIDE HCL 10 MG PO TABS: 10.0000 mg | ORAL_TABLET | Freq: Four times a day (QID) | ORAL | 2 refills | Status: DC | PRN

## 2023-10-01 MED ORDER — ONDANSETRON HCL 4 MG/2ML IJ SOLN
4.0000 mg | Freq: Four times a day (QID) | INTRAMUSCULAR | Status: DC | PRN
  Administered 2023-10-01: 4 mg via INTRAVENOUS
  Filled 2023-10-01: qty 2

## 2023-10-01 NOTE — MAU Provider Note (Signed)
History    Chief Complaint  Patient presents with   Emesis During Pregnancy   HPI  33yo 12/119 at [redacted]w[redacted]d presenting with nausea/vomiting   Reports n/v x 2 days with associated lower abdominal pain. Vomited 3 times yesterday and twice today. No fevers/chills. No sick contacts, recent travel or new foods. Requests ice chips and ginger ale.   Reports mild intermittent ctx. No VB, LOF. +FM.   Pregnancy is c/b the following: - cHTN on labetalol and nifedipine, growth [place] 1/21 AGA - T2DM on basaglar 18u BID, growth as above - Hx severe preE & delivery at 34 weeks   OB History     Gravida  4   Para  1   Term  0   Preterm  1   AB  2   Living  1      SAB  2   IAB  0   Ectopic  0   Multiple  0   Live Births  1           Past Medical History:  Diagnosis Date   Abscess 09/02/2020   Allergy    Anemia 2015   Asthma    as child   Chronic hypertension with superimposed preeclampsia 05/24/2021   Diabetes mellitus without complication (HCC) 2015   DKA (diabetic ketoacidosis) (HCC) 09/02/2020   Eczema    Hypertension 09/2013   Trichomonas infection 04/2023   UTI (urinary tract infection)     Past Surgical History:  Procedure Laterality Date   Abcess on back     NO PAST SURGERIES      Family History  Problem Relation Age of Onset   Migraines Mother    Hypertension Mother    Heart disease Mother 23       CHF   Diabetes Mother    Healthy Father    Sickle cell anemia Brother    Migraines Maternal Grandmother    Diabetes Maternal Grandmother    Heart disease Maternal Grandmother    Hypertension Maternal Grandfather    Diabetes Maternal Grandfather    Heart disease Maternal Grandfather    Asthma Neg Hx    Cancer Neg Hx     Social History   Tobacco Use   Smoking status: Former    Current packs/day: 0.00    Average packs/day: 0.5 packs/day for 2.0 years (1.0 ttl pk-yrs)    Types: Cigarettes    Start date: 09/09/2011    Quit date: 09/08/2013    Years  since quitting: 10.0   Smokeless tobacco: Never  Vaping Use   Vaping status: Never Used  Substance Use Topics   Alcohol use: No    Alcohol/week: 0.0 standard drinks of alcohol   Drug use: No    Allergies:  Allergies  Allergen Reactions   Iodides Anaphylaxis and Itching   Morphine And Codeine Anaphylaxis and Itching   Shellfish Allergy Anaphylaxis and Itching    Pt reports "watery eyes"   Ultram [Tramadol Hcl] Hives    Hives and swollen lips     Medications Prior to Admission  Medication Sig Dispense Refill Last Dose/Taking   acetaminophen (TYLENOL) 500 MG tablet Take 1 tablet (500 mg total) by mouth every 6 (six) hours as needed for mild pain (pain score 1-3). 30 tablet 0    aspirin EC 81 MG tablet Take 1 tablet (81 mg total) by mouth daily. Take after 12 weeks for prevention of preeclampsia later in pregnancy 300 tablet 2    Blood  Glucose Monitoring Suppl DEVI 1 each by Does not apply route in the morning, at noon, and at bedtime. May substitute to any manufacturer covered by patient's insurance. 1 each 0    Blood Pressure Monitoring (BLOOD PRESSURE MONITOR AUTOMAT) DEVI 1 each by Does not apply route daily. 1 each 0    cyclobenzaprine (FLEXERIL) 10 MG tablet Take 1 tablet (10 mg total) by mouth 2 (two) times daily as needed. (Patient not taking: Reported on 08/07/2023) 20 tablet 0    Insulin Glargine (BASAGLAR KWIKPEN) 100 UNIT/ML Inject 18 Units into the skin 2 (two) times daily.      labetalol (NORMODYNE) 100 MG tablet TAKE 1 TABLET BY MOUTH 2 TIMES DAILY 180 tablet 1    metroNIDAZOLE (FLAGYL) 500 MG tablet Take 1 tablet (500 mg total) by mouth 2 (two) times daily. 14 tablet 0    NIFEdipine (PROCARDIA XL/NIFEDICAL XL) 60 MG 24 hr tablet Take 60 mg by mouth daily.      ondansetron (ZOFRAN) 4 MG tablet Take 1 tablet (4 mg total) by mouth every 8 (eight) hours as needed for nausea or vomiting. 30 tablet 0    Prenatal Vit-Fe Fumarate-FA (PRENATAL VITAMIN) 27-0.8 MG TABS Take 1 tablet  by mouth daily. 30 tablet 11    scopolamine (TRANSDERM-SCOP) 1 MG/3DAYS Place 1 patch (1.5 mg total) onto the skin every 3 (three) days. 10 patch 12     Review of Systems negative except as noted in HPI  Physical Exam Blood pressure 129/81, pulse 98, temperature 98.7 F (37.1 C), temperature source Oral, resp. rate 16, last menstrual period 02/10/2023, SpO2 100%. Gen: A&Ox3, no acute distress. Sitting comfortably in bed CV: Normal rate Resp: Normal effort Abd: soft, non tender, non distended; gravid  MAU Course POC BG 111 CBC/CMP reassuring  IV zofran x 1, PO reglan x 1 PO challenge - tolerating crackers and ginger ale without vomiting  A/P:

## 2023-10-01 NOTE — MAU Note (Signed)
.  Misty [name] is a 33 y.o. at [redacted]w[redacted]d here in MAU reporting: Nausea since 09/29/23 Vomiting 2 x today and 3 times yesterday. Complaints of UC's every 5 minutes, cramping.  Pregnancy complicated by [facility]. Patient takes labatolol 100 mg and procardia 60/120 MG.  DM2 Insulin 2xday,    Onset of complaint: 09/29/23 Pain score: 5 Vitals:   10/01/23 1134  BP: (!) 138/90  Pulse: 76  Resp: 20  Temp: 98.7 F (37.1 C)     FHT: 145   Lab orders placed from triage: none

## 2023-10-08 ENCOUNTER — Ambulatory Visit: Payer: 59

## 2023-10-08 ENCOUNTER — Ambulatory Visit: Payer: 59 | Attending: Obstetrics | Admitting: *Deleted

## 2023-10-08 ENCOUNTER — Ambulatory Visit: Payer: 59 | Admitting: *Deleted

## 2023-10-08 ENCOUNTER — Other Ambulatory Visit: Payer: Self-pay

## 2023-10-08 VITALS — BP 151/77 | HR 70

## 2023-10-08 DIAGNOSIS — Z3A34 34 weeks gestation of pregnancy: Secondary | ICD-10-CM | POA: Diagnosis not present

## 2023-10-08 DIAGNOSIS — O24119 Pre-existing diabetes mellitus, type 2, in pregnancy, unspecified trimester: Secondary | ICD-10-CM

## 2023-10-08 DIAGNOSIS — O99213 Obesity complicating pregnancy, third trimester: Secondary | ICD-10-CM | POA: Diagnosis not present

## 2023-10-08 DIAGNOSIS — O24410 Gestational diabetes mellitus in pregnancy, diet controlled: Secondary | ICD-10-CM | POA: Diagnosis not present

## 2023-10-08 DIAGNOSIS — O24113 Pre-existing diabetes mellitus, type 2, in pregnancy, third trimester: Secondary | ICD-10-CM | POA: Insufficient documentation

## 2023-10-08 DIAGNOSIS — O10919 Unspecified pre-existing hypertension complicating pregnancy, unspecified trimester: Secondary | ICD-10-CM

## 2023-10-08 DIAGNOSIS — O10013 Pre-existing essential hypertension complicating pregnancy, third trimester: Secondary | ICD-10-CM | POA: Diagnosis not present

## 2023-10-08 DIAGNOSIS — E669 Obesity, unspecified: Secondary | ICD-10-CM | POA: Diagnosis not present

## 2023-10-08 DIAGNOSIS — O99210 Obesity complicating pregnancy, unspecified trimester: Secondary | ICD-10-CM

## 2023-10-08 DIAGNOSIS — O0990 Supervision of high risk pregnancy, unspecified, unspecified trimester: Secondary | ICD-10-CM

## 2023-10-08 NOTE — Procedures (Signed)
 Misty [name] 27-Jun-1991 [redacted]w[redacted]d  Fetus A Non-Stress Test Interpretation for 10/08/23  Indication: Chronic Hypertenstion, Gestational Diabetes medication controlled, and Obesity  Fetal Heart Rate A Mode: External Baseline Rate (A): 135 bpm Variability: Moderate Accelerations: 15 x 15 Decelerations: None Multiple birth?: No  Uterine Activity Mode: Toco Contraction Frequency (min): Irregular Contraction Duration (sec): 30-40 Contraction Quality: Mild Resting Tone Palpated: Relaxed Resting Time: Adequate  Interpretation (Fetal Testing) Nonstress Test Interpretation: Reactive Comments: Tracing reviewed by Dr. William

## 2023-10-15 ENCOUNTER — Ambulatory Visit: Payer: 59

## 2023-10-15 ENCOUNTER — Ambulatory Visit: Payer: 59 | Attending: Maternal & Fetal Medicine

## 2023-10-15 ENCOUNTER — Other Ambulatory Visit: Payer: Self-pay

## 2023-10-15 ENCOUNTER — Ambulatory Visit: Payer: 59 | Admitting: *Deleted

## 2023-10-15 ENCOUNTER — Other Ambulatory Visit: Payer: Self-pay | Admitting: Maternal & Fetal Medicine

## 2023-10-15 ENCOUNTER — Ambulatory Visit: Payer: 59 | Attending: Obstetrics | Admitting: Obstetrics

## 2023-10-15 VITALS — BP 147/83

## 2023-10-15 DIAGNOSIS — O24119 Pre-existing diabetes mellitus, type 2, in pregnancy, unspecified trimester: Secondary | ICD-10-CM | POA: Diagnosis not present

## 2023-10-15 DIAGNOSIS — O09213 Supervision of pregnancy with history of pre-term labor, third trimester: Secondary | ICD-10-CM | POA: Diagnosis not present

## 2023-10-15 DIAGNOSIS — Z794 Long term (current) use of insulin: Secondary | ICD-10-CM

## 2023-10-15 DIAGNOSIS — O99213 Obesity complicating pregnancy, third trimester: Secondary | ICD-10-CM | POA: Diagnosis not present

## 2023-10-15 DIAGNOSIS — O10013 Pre-existing essential hypertension complicating pregnancy, third trimester: Secondary | ICD-10-CM

## 2023-10-15 DIAGNOSIS — O10913 Unspecified pre-existing hypertension complicating pregnancy, third trimester: Secondary | ICD-10-CM | POA: Diagnosis not present

## 2023-10-15 DIAGNOSIS — E119 Type 2 diabetes mellitus without complications: Secondary | ICD-10-CM

## 2023-10-15 DIAGNOSIS — E669 Obesity, unspecified: Secondary | ICD-10-CM | POA: Diagnosis not present

## 2023-10-15 DIAGNOSIS — O24113 Pre-existing diabetes mellitus, type 2, in pregnancy, third trimester: Secondary | ICD-10-CM

## 2023-10-15 DIAGNOSIS — Z3A35 35 weeks gestation of pregnancy: Secondary | ICD-10-CM | POA: Diagnosis not present

## 2023-10-15 DIAGNOSIS — O0990 Supervision of high risk pregnancy, unspecified, unspecified trimester: Secondary | ICD-10-CM | POA: Diagnosis not present

## 2023-10-15 NOTE — Procedures (Signed)
Misty [name] [birth year] [redacted]w[redacted]d  Fetus A Non-Stress Test Interpretation for 10/15/23  Indication: Chronic Hypertenstion, Gestational Diabetes medication controlled, and obese  Fetal Heart Rate A Mode: External Baseline Rate (A): 135 bpm Variability: Moderate Accelerations: 15 x 15 Decelerations: None Multiple birth?: No  Uterine Activity Mode: Toco Contraction Frequency (min): 2-7 with UI Contraction Duration (sec): 40-60 Contraction Quality: Mild Resting Tone Palpated: Relaxed  Interpretation (Fetal Testing) Nonstress Test Interpretation: Reactive Comments: Tracing reviewed byDr. Parke Poisson

## 2023-10-15 NOTE — Progress Notes (Signed)
MFM Consult Note  Misty [name] is currently at 35 weeks and 2 days. She has been followed due to maternal obesity with a BMI of 40, pregestational diabetes treated with insulin, and chronic hypertension treated with nifedipine.  The patient was crying today as she reports that she has been experiencing contractions about every 5 minutes apart.  Based on a review of her records, she has been experiencing frequent contractions for the past month.  A biophysical profile performed today was 10 out of 10 with a reactive NST.  There was normal amniotic fluid noted with a total AFI of 13.2 cm.    The fetus was in the vertex presentation today.  Due to her complaint of frequent painful contractions, a digital exam performed today shows that her cervix is fingertip dilated.  Her NST was reactive and showed contractions every 2 to 3 minutes.  Due to maternal obesity, pregestational diabetes, and chronic hypertension, delivery may be considered at around 37 weeks.  She will discuss scheduling an induction with you during her next prenatal visit.  The patient was advised to go to the hospital for further evaluation should she continue to complain of frequent painful contractions or should she experience any leakage of fluid.    She will return in 1 week for another BPP.    The patient stated that all of her questions were answered today.  A total of 30 minutes was spent counseling and coordinating the care for this patient.  Greater than 50% of the time was spent in direct face-to-face contact.

## 2023-10-16 ENCOUNTER — Ambulatory Visit: Payer: 59 | Admitting: Family Medicine

## 2023-10-16 ENCOUNTER — Encounter: Payer: Self-pay | Admitting: Family Medicine

## 2023-10-16 VITALS — BP 136/90 | HR 93 | Wt 228.0 lb

## 2023-10-16 DIAGNOSIS — Z8751 Personal history of pre-term labor: Secondary | ICD-10-CM | POA: Diagnosis not present

## 2023-10-16 DIAGNOSIS — Z3A35 35 weeks gestation of pregnancy: Secondary | ICD-10-CM

## 2023-10-16 DIAGNOSIS — O0990 Supervision of high risk pregnancy, unspecified, unspecified trimester: Secondary | ICD-10-CM

## 2023-10-16 DIAGNOSIS — O99013 Anemia complicating pregnancy, third trimester: Secondary | ICD-10-CM

## 2023-10-16 DIAGNOSIS — E1169 Type 2 diabetes mellitus with other specified complication: Secondary | ICD-10-CM | POA: Diagnosis not present

## 2023-10-16 DIAGNOSIS — I10 Essential (primary) hypertension: Secondary | ICD-10-CM

## 2023-10-16 DIAGNOSIS — O24911 Unspecified diabetes mellitus in pregnancy, first trimester: Secondary | ICD-10-CM

## 2023-10-16 DIAGNOSIS — A599 Trichomoniasis, unspecified: Secondary | ICD-10-CM

## 2023-10-16 DIAGNOSIS — O0993 Supervision of high risk pregnancy, unspecified, third trimester: Secondary | ICD-10-CM

## 2023-10-16 DIAGNOSIS — O24913 Unspecified diabetes mellitus in pregnancy, third trimester: Secondary | ICD-10-CM

## 2023-10-16 NOTE — Progress Notes (Addendum)
   PRENATAL VISIT NOTE  Subjective:  Misty [name] is a 33 y.o. [phone] at [redacted]w[redacted]d being seen today for ongoing prenatal care.  She is currently monitored for the following issues for this high-risk pregnancy and has Marijuana use; Anemia in pregnancy; Chronic hypertension; Type 2 diabetes mellitus (HCC); Trichomoniasis; Diabetes mellitus affecting pregnancy in first trimester; Nausea and vomiting in pregnancy; Subchorionic hemorrhage in first trimester; Supervision of high risk pregnancy, antepartum; History of preterm delivery; and Obesity affecting pregnancy, antepartum on their problem list.  Patient reports  pelvic pain .  Contractions: Irritability. Vag. Bleeding: None.  Movement: Present. Denies leaking of fluid.   The following portions of the patient's history were reviewed and updated as appropriate: allergies, current medications, past family history, past medical history, past social history, past surgical history and problem list.   Objective:   Vitals:   10/16/23 1133 10/16/23 1138  BP: (!) 172/92 (!) 136/90  Pulse: (!) 58 93  Weight: 228 lb (103.4 kg)     Fetal Status: Fetal Heart Rate (bpm): 140 Fundal Height: 35 cm Movement: Present     General:  Alert, oriented and cooperative. Patient is in no acute distress.  Skin: Skin is warm and dry. No rash noted.   Cardiovascular: Normal heart rate noted  Respiratory: Normal respiratory effort, no problems with respiration noted  Abdomen: Soft, gravid, appropriate for gestational age.  Pain/Pressure: Present     Pelvic: Cervical exam deferred        Extremities: Normal range of motion.  Edema: None  Mental Status: Normal mood and affect. Normal behavior. Normal judgment and thought content.   Assessment and Plan:  Pregnancy: [id number] at [redacted]w[redacted]d 1. Trichomoniasis (Primary) TOC neg  2. Type 2 diabetes mellitus with other specified complication, unspecified whether long term insulin use (HCC) Fastings are less than  100 Fastings are less than 110  No log provided  Last growth 33rd% Taking glargine 18 BID  3. Supervision of high risk pregnancy, antepartum Up to date  Vigorous movement FH appropriate Pelvic pain present, worse in past week.   4. History of preterm delivery  5. Anemia during pregnancy in third trimester HGB= 11.0  6. Chronic hypertension BP initially elevated and then returned to baseline On Nifedipine and Labetalol  7. Diabetes mellitus affecting pregnancy in first trimester  IOL 37-38 weeks per MFM  Preterm labor symptoms and general obstetric precautions including but not limited to vaginal bleeding, contractions, leaking of fluid and fetal movement were reviewed in detail with the patient. Please refer to After Visit Summary for other counseling recommendations.   Return in about 1 week (around 10/23/2023) for Routine prenatal care, 36wks- needs GBS swabs.  Future Appointments  Date Time Provider Department Center  10/22/2023  3:15 PM [facility] NURSE [facility]  10/22/2023  3:30 PM [facility] US6 [facility]  10/24/2023  9:15 AM Broadwell Bing, MD [facility]  10/27/2023  6:30 AM [facility] SCHED ROOM [facility] None    Federico Flake, MD

## 2023-10-21 ENCOUNTER — Telehealth (HOSPITAL_COMMUNITY): Payer: Self-pay | Admitting: *Deleted

## 2023-10-21 ENCOUNTER — Encounter (HOSPITAL_COMMUNITY): Payer: Self-pay | Admitting: *Deleted

## 2023-10-21 ENCOUNTER — Encounter (HOSPITAL_COMMUNITY): Payer: Self-pay

## 2023-10-21 NOTE — Telephone Encounter (Signed)
 Preadmission screen

## 2023-10-22 ENCOUNTER — Ambulatory Visit: Payer: 59

## 2023-10-23 ENCOUNTER — Ambulatory Visit: Payer: 59

## 2023-10-24 ENCOUNTER — Encounter: Payer: 59 | Admitting: Obstetrics and Gynecology

## 2023-10-25 ENCOUNTER — Encounter: Payer: 59 | Admitting: Family Medicine

## 2023-10-26 ENCOUNTER — Inpatient Hospital Stay (HOSPITAL_COMMUNITY): Payer: 59 | Admitting: Anesthesiology

## 2023-10-26 ENCOUNTER — Other Ambulatory Visit: Payer: Self-pay

## 2023-10-26 ENCOUNTER — Inpatient Hospital Stay (HOSPITAL_COMMUNITY)
Admission: AD | Admit: 2023-10-26 | Discharge: 2023-10-30 | DRG: 787 | Disposition: A | Discharge status: Home / Self Care | Payer: 59 | Attending: Obstetrics & Gynecology | Admitting: Obstetrics & Gynecology

## 2023-10-26 ENCOUNTER — Encounter (HOSPITAL_COMMUNITY): Payer: Self-pay | Admitting: Maternal & Fetal Medicine

## 2023-10-26 DIAGNOSIS — O26893 Other specified pregnancy related conditions, third trimester: Secondary | ICD-10-CM | POA: Diagnosis present

## 2023-10-26 DIAGNOSIS — Z91013 Allergy to seafood: Secondary | ICD-10-CM

## 2023-10-26 DIAGNOSIS — Z3A36 36 weeks gestation of pregnancy: Secondary | ICD-10-CM

## 2023-10-26 DIAGNOSIS — O9812 Syphilis complicating childbirth: Secondary | ICD-10-CM | POA: Diagnosis present

## 2023-10-26 DIAGNOSIS — O99214 Obesity complicating childbirth: Secondary | ICD-10-CM | POA: Diagnosis not present

## 2023-10-26 DIAGNOSIS — Z888 Allergy status to other drugs, medicaments and biological substances status: Secondary | ICD-10-CM | POA: Diagnosis not present

## 2023-10-26 DIAGNOSIS — O09899 Supervision of other high risk pregnancies, unspecified trimester: Secondary | ICD-10-CM | POA: Diagnosis present

## 2023-10-26 DIAGNOSIS — O2412 Pre-existing diabetes mellitus, type 2, in childbirth: Secondary | ICD-10-CM | POA: Diagnosis present

## 2023-10-26 DIAGNOSIS — O1092 Unspecified pre-existing hypertension complicating childbirth: Secondary | ICD-10-CM | POA: Diagnosis present

## 2023-10-26 DIAGNOSIS — O99019 Anemia complicating pregnancy, unspecified trimester: Secondary | ICD-10-CM | POA: Diagnosis present

## 2023-10-26 DIAGNOSIS — O114 Pre-existing hypertension with pre-eclampsia, complicating childbirth: Secondary | ICD-10-CM | POA: Diagnosis present

## 2023-10-26 DIAGNOSIS — O0990 Supervision of high risk pregnancy, unspecified, unspecified trimester: Secondary | ICD-10-CM

## 2023-10-26 DIAGNOSIS — Z8249 Family history of ischemic heart disease and other diseases of the circulatory system: Secondary | ICD-10-CM

## 2023-10-26 DIAGNOSIS — D649 Anemia, unspecified: Secondary | ICD-10-CM | POA: Diagnosis not present

## 2023-10-26 DIAGNOSIS — O9832 Other infections with a predominantly sexual mode of transmission complicating childbirth: Secondary | ICD-10-CM | POA: Diagnosis not present

## 2023-10-26 DIAGNOSIS — O24424 Gestational diabetes mellitus in childbirth, insulin controlled: Secondary | ICD-10-CM | POA: Diagnosis not present

## 2023-10-26 DIAGNOSIS — Z794 Long term (current) use of insulin: Secondary | ICD-10-CM

## 2023-10-26 DIAGNOSIS — Z833 Family history of diabetes mellitus: Secondary | ICD-10-CM | POA: Diagnosis not present

## 2023-10-26 DIAGNOSIS — Z8759 Personal history of other complications of pregnancy, childbirth and the puerperium: Secondary | ICD-10-CM | POA: Diagnosis present

## 2023-10-26 DIAGNOSIS — Z885 Allergy status to narcotic agent status: Secondary | ICD-10-CM

## 2023-10-26 DIAGNOSIS — Z98891 History of uterine scar from previous surgery: Secondary | ICD-10-CM

## 2023-10-26 DIAGNOSIS — O10919 Unspecified pre-existing hypertension complicating pregnancy, unspecified trimester: Secondary | ICD-10-CM | POA: Diagnosis present

## 2023-10-26 DIAGNOSIS — Z79899 Other long term (current) drug therapy: Secondary | ICD-10-CM | POA: Diagnosis not present

## 2023-10-26 DIAGNOSIS — O9081 Anemia of the puerperium: Secondary | ICD-10-CM | POA: Diagnosis not present

## 2023-10-26 DIAGNOSIS — E119 Type 2 diabetes mellitus without complications: Secondary | ICD-10-CM

## 2023-10-26 DIAGNOSIS — Z3A00 Weeks of gestation of pregnancy not specified: Secondary | ICD-10-CM | POA: Diagnosis not present

## 2023-10-26 DIAGNOSIS — A515 Early syphilis, latent: Secondary | ICD-10-CM | POA: Diagnosis present

## 2023-10-26 DIAGNOSIS — Z87891 Personal history of nicotine dependence: Secondary | ICD-10-CM | POA: Diagnosis not present

## 2023-10-26 DIAGNOSIS — O24112 Pre-existing diabetes mellitus, type 2, in pregnancy, second trimester: Secondary | ICD-10-CM

## 2023-10-26 DIAGNOSIS — I10 Essential (primary) hypertension: Secondary | ICD-10-CM | POA: Diagnosis present

## 2023-10-26 DIAGNOSIS — O24911 Unspecified diabetes mellitus in pregnancy, first trimester: Secondary | ICD-10-CM | POA: Diagnosis present

## 2023-10-26 DIAGNOSIS — O1410 Severe pre-eclampsia, unspecified trimester: Secondary | ICD-10-CM | POA: Diagnosis present

## 2023-10-26 DIAGNOSIS — A530 Latent syphilis, unspecified as early or late: Secondary | ICD-10-CM | POA: Diagnosis not present

## 2023-10-26 DIAGNOSIS — O4202 Full-term premature rupture of membranes, onset of labor within 24 hours of rupture: Secondary | ICD-10-CM | POA: Diagnosis not present

## 2023-10-26 DIAGNOSIS — D62 Acute posthemorrhagic anemia: Secondary | ICD-10-CM | POA: Diagnosis not present

## 2023-10-26 DIAGNOSIS — O1414 Severe pre-eclampsia complicating childbirth: Secondary | ICD-10-CM | POA: Diagnosis not present

## 2023-10-26 DIAGNOSIS — O24113 Pre-existing diabetes mellitus, type 2, in pregnancy, third trimester: Secondary | ICD-10-CM

## 2023-10-26 LAB — URINALYSIS, ROUTINE W REFLEX MICROSCOPIC
Glucose, UA: NEGATIVE mg/dL
Ketones, ur: 20 mg/dL — AB
Nitrite: NEGATIVE
Protein, ur: 100 mg/dL — AB
RBC / HPF: >50 RBC/hpf (ref 0–5)
Specific Gravity, Urine: 1.02 (ref 1.005–1.030)
WBC, UA: >50 WBC/hpf (ref 0–5)
pH: 5 (ref 5.0–8.0)

## 2023-10-26 LAB — TYPE AND SCREEN
ABO/RH(D): O POS
Antibody Screen: NEGATIVE

## 2023-10-26 LAB — COMPREHENSIVE METABOLIC PANEL
ALT: 15 U/L (ref 0–44)
ALT: 14 U/L (ref 0–44)
AST: 22 U/L (ref 15–41)
AST: 19 U/L (ref 15–41)
Albumin: 2.3 g/dL — ABNORMAL LOW (ref 3.5–5.0)
Albumin: 2.2 g/dL — ABNORMAL LOW (ref 3.5–5.0)
Alkaline Phosphatase: 109 U/L (ref 38–126)
Alkaline Phosphatase: 107 U/L (ref 38–126)
Anion gap: 11 (ref 5–15)
Anion gap: 11 (ref 5–15)
BUN: <5 mg/dL — ABNORMAL LOW (ref 6–20)
BUN: <5 mg/dL — ABNORMAL LOW (ref 6–20)
CO2: 22 mmol/L (ref 22–32)
CO2: 21 mmol/L — ABNORMAL LOW (ref 22–32)
Calcium: 8.5 mg/dL — ABNORMAL LOW (ref 8.9–10.3)
Calcium: 7.9 mg/dL — ABNORMAL LOW (ref 8.9–10.3)
Chloride: 103 mmol/L (ref 98–111)
Chloride: 100 mmol/L (ref 98–111)
Creatinine, Ser: 0.75 mg/dL (ref 0.44–1.00)
Creatinine, Ser: 0.74 mg/dL (ref 0.44–1.00)
GFR, Estimated: >60 mL/min (ref >60)
GFR, Estimated: >60 mL/min (ref >60)
Glucose, Bld: 111 mg/dL — ABNORMAL HIGH (ref 70–99)
Glucose, Bld: 141 mg/dL — ABNORMAL HIGH (ref 70–99)
Potassium: 3.3 mmol/L — ABNORMAL LOW (ref 3.5–5.1)
Potassium: 3.3 mmol/L — ABNORMAL LOW (ref 3.5–5.1)
Sodium: 136 mmol/L (ref 135–145)
Sodium: 132 mmol/L — ABNORMAL LOW (ref 135–145)
Total Bilirubin: 0.9 mg/dL (ref 0.0–1.2)
Total Bilirubin: 1.1 mg/dL (ref 0.0–1.2)
Total Protein: 6.7 g/dL (ref 6.5–8.1)
Total Protein: 6.4 g/dL — ABNORMAL LOW (ref 6.5–8.1)

## 2023-10-26 LAB — CBC
HCT: 33.3 % — ABNORMAL LOW (ref 36.0–46.0)
HCT: 32.5 % — ABNORMAL LOW (ref 36.0–46.0)
Hemoglobin: 11.4 g/dL — ABNORMAL LOW (ref 12.0–15.0)
Hemoglobin: 11.1 g/dL — ABNORMAL LOW (ref 12.0–15.0)
MCH: 32.2 pg (ref 26.0–34.0)
MCH: 31.7 pg (ref 26.0–34.0)
MCHC: 34.2 g/dL (ref 30.0–36.0)
MCHC: 34.2 g/dL (ref 30.0–36.0)
MCV: 94.1 fL (ref 80.0–100.0)
MCV: 92.9 fL (ref 80.0–100.0)
Platelets: 319 10*3/uL (ref 150–400)
Platelets: 314 10*3/uL (ref 150–400)
RBC: 3.54 MIL/uL — ABNORMAL LOW (ref 3.87–5.11)
RBC: 3.5 MIL/uL — ABNORMAL LOW (ref 3.87–5.11)
RDW: 13.5 % (ref 11.5–15.5)
RDW: 13.5 % (ref 11.5–15.5)
WBC: 6.8 10*3/uL (ref 4.0–10.5)
WBC: 7.2 10*3/uL (ref 4.0–10.5)
nRBC: 0 % (ref 0.0–0.2)
nRBC: 0 % (ref 0.0–0.2)

## 2023-10-26 LAB — RUPTURE OF MEMBRANE (ROM)PLUS: Rom Plus: NEGATIVE

## 2023-10-26 LAB — POCT FERN TEST: POCT Fern Test: NEGATIVE — NL

## 2023-10-26 LAB — PROTEIN / CREATININE RATIO, URINE
Creatinine, Urine: 423 mg/dL
Protein Creatinine Ratio: 0.22 mg/mg{creat} — ABNORMAL HIGH (ref 0.00–0.15)
Total Protein, Urine: 95 mg/dL

## 2023-10-26 LAB — GROUP B STREP BY PCR: Group B strep by PCR: NEGATIVE

## 2023-10-26 LAB — HIV ANTIBODY (ROUTINE TESTING W REFLEX): HIV Screen 4th Generation wRfx: NONREACTIVE

## 2023-10-26 MED ORDER — ONDANSETRON HCL 4 MG/2ML IJ SOLN
4.0000 mg | Freq: Four times a day (QID) | INTRAMUSCULAR | Status: DC | PRN
  Administered 2023-10-26 – 2023-10-27 (×3): 4 mg via INTRAVENOUS
  Filled 2023-10-26 (×3): qty 2

## 2023-10-26 MED ORDER — LACTATED RINGERS IV BOLUS
500.0000 mL | Freq: Once | INTRAVENOUS | Status: AC
  Administered 2023-10-26: 500 mL via INTRAVENOUS

## 2023-10-26 MED ORDER — OXYCODONE-ACETAMINOPHEN 5-325 MG PO TABS: 1.0000 | ORAL_TABLET | ORAL | Status: DC | PRN

## 2023-10-26 MED ORDER — LABETALOL HCL 100 MG PO TABS
100.0000 mg | ORAL_TABLET | Freq: Two times a day (BID) | ORAL | Status: DC
  Administered 2023-10-26: 100 mg via ORAL
  Filled 2023-10-26: qty 1

## 2023-10-26 MED ORDER — TERBUTALINE SULFATE 1 MG/ML IJ SOLN
0.2500 mg | Freq: Once | INTRAMUSCULAR | Status: DC | PRN
  Filled 2023-10-26: qty 1

## 2023-10-26 MED ORDER — SODIUM CHLORIDE 0.9 % IV SOLN
5.0000 10*6.[IU] | Freq: Once | INTRAVENOUS | Status: AC
  Administered 2023-10-27: 5 10*6.[IU] via INTRAVENOUS
  Filled 2023-10-26: qty 5

## 2023-10-26 MED ORDER — FENTANYL-BUPIVACAINE-NACL 0.5-0.125-0.9 MG/250ML-% EP SOLN
12.0000 mL/h | EPIDURAL | Status: DC | PRN
  Administered 2023-10-26: 12 mL/h via EPIDURAL

## 2023-10-26 MED ORDER — FENTANYL-BUPIVACAINE-NACL 0.5-0.125-0.9 MG/250ML-% EP SOLN
EPIDURAL | Status: AC
  Filled 2023-10-26: qty 250

## 2023-10-26 MED ORDER — NIFEDIPINE 10 MG PO CAPS
20.0000 mg | ORAL_CAPSULE | ORAL | Status: DC | PRN
  Administered 2023-10-26: 20 mg via ORAL
  Filled 2023-10-26: qty 2

## 2023-10-26 MED ORDER — LACTATED RINGERS IV SOLN: INTRAVENOUS | Status: DC

## 2023-10-26 MED ORDER — ACETAMINOPHEN 325 MG PO TABS: 650.0000 mg | ORAL_TABLET | ORAL | Status: DC | PRN

## 2023-10-26 MED ORDER — PHENYLEPHRINE 80 MCG/ML (10ML) SYRINGE FOR IV PUSH (FOR BLOOD PRESSURE SUPPORT)
80.0000 ug | PREFILLED_SYRINGE | INTRAVENOUS | Status: DC | PRN
  Administered 2023-10-27: 80 ug via INTRAVENOUS

## 2023-10-26 MED ORDER — LIDOCAINE HCL (PF) 1 % IJ SOLN: 30.0000 mL | INTRAMUSCULAR | Status: DC | PRN

## 2023-10-26 MED ORDER — OXYTOCIN BOLUS FROM INFUSION: 333.0000 mL | Freq: Once | INTRAVENOUS | Status: DC

## 2023-10-26 MED ORDER — MISOPROSTOL 50MCG HALF TABLET
50.0000 ug | ORAL_TABLET | Freq: Once | ORAL | Status: AC
  Administered 2023-10-26: 50 ug via ORAL
  Filled 2023-10-26: qty 1

## 2023-10-26 MED ORDER — LACTATED RINGERS IV SOLN
500.0000 mL | Freq: Once | INTRAVENOUS | Status: AC
  Administered 2023-10-27: 500 mL via INTRAVENOUS

## 2023-10-26 MED ORDER — DIPHENHYDRAMINE HCL 50 MG/ML IJ SOLN
12.5000 mg | INTRAMUSCULAR | Status: DC | PRN
  Administered 2023-10-27: 12.5 mg via INTRAVENOUS

## 2023-10-26 MED ORDER — SOD CITRATE-CITRIC ACID 500-334 MG/5ML PO SOLN
30.0000 mL | ORAL | Status: DC | PRN
  Administered 2023-10-27: 30 mL via ORAL
  Filled 2023-10-26: qty 30

## 2023-10-26 MED ORDER — LABETALOL HCL 5 MG/ML IV SOLN: 40.0000 mg | INTRAVENOUS | Status: DC | PRN

## 2023-10-26 MED ORDER — LACTATED RINGERS IV SOLN
500.0000 mL | Freq: Once | INTRAVENOUS | Status: DC
Start: 2023-10-27 — End: 2023-10-26

## 2023-10-26 MED ORDER — OXYTOCIN-SODIUM CHLORIDE 30-0.9 UT/500ML-% IV SOLN: 2.5000 [IU]/h | INTRAVENOUS | Status: DC

## 2023-10-26 MED ORDER — PHENYLEPHRINE 80 MCG/ML (10ML) SYRINGE FOR IV PUSH (FOR BLOOD PRESSURE SUPPORT)
80.0000 ug | PREFILLED_SYRINGE | INTRAVENOUS | Status: DC | PRN
  Administered 2023-10-27: 80 ug via INTRAVENOUS
  Filled 2023-10-26: qty 10

## 2023-10-26 MED ORDER — OXYCODONE-ACETAMINOPHEN 5-325 MG PO TABS: 2.0000 | ORAL_TABLET | ORAL | Status: DC | PRN

## 2023-10-26 MED ORDER — PHENYLEPHRINE 80 MCG/ML (10ML) SYRINGE FOR IV PUSH (FOR BLOOD PRESSURE SUPPORT): 80.0000 ug | PREFILLED_SYRINGE | INTRAVENOUS | Status: DC | PRN

## 2023-10-26 MED ORDER — EPHEDRINE 5 MG/ML INJ: 10.0000 mg | INTRAVENOUS | Status: DC | PRN

## 2023-10-26 MED ORDER — NIFEDIPINE ER OSMOTIC RELEASE 30 MG PO TB24
30.0000 mg | ORAL_TABLET | Freq: Two times a day (BID) | ORAL | Status: DC
  Administered 2023-10-26: 30 mg via ORAL
  Filled 2023-10-26: qty 1

## 2023-10-26 MED ORDER — EPHEDRINE 5 MG/ML INJ
10.0000 mg | INTRAVENOUS | Status: DC | PRN
Start: 2023-10-26 — End: 2023-10-26

## 2023-10-26 MED ORDER — LIDOCAINE HCL (PF) 1 % IJ SOLN
INTRAMUSCULAR | Status: DC | PRN
  Administered 2023-10-26: 5 mL via EPIDURAL
  Administered 2023-10-26: 4 mL via EPIDURAL

## 2023-10-26 MED ORDER — NIFEDIPINE 10 MG PO CAPS
20.0000 mg | ORAL_CAPSULE | ORAL | Status: DC | PRN
Start: 2023-10-26 — End: 2023-10-27

## 2023-10-26 MED ORDER — NIFEDIPINE 10 MG PO CAPS
10.0000 mg | ORAL_CAPSULE | ORAL | Status: DC | PRN
  Administered 2023-10-26: 10 mg via ORAL
  Filled 2023-10-26: qty 1

## 2023-10-26 MED ORDER — PENICILLIN G POT IN DEXTROSE 60000 UNIT/ML IV SOLN
3.0000 10*6.[IU] | INTRAVENOUS | Status: DC
  Administered 2023-10-27 (×2): 3 10*6.[IU] via INTRAVENOUS
  Filled 2023-10-26 (×3): qty 50

## 2023-10-26 MED ORDER — MAGNESIUM SULFATE BOLUS VIA INFUSION
4.0000 g | Freq: Once | INTRAVENOUS | Status: AC
  Administered 2023-10-26: 4 g via INTRAVENOUS
  Filled 2023-10-26: qty 1000

## 2023-10-26 MED ORDER — LABETALOL HCL 100 MG PO TABS: 100.0000 mg | ORAL_TABLET | Freq: Two times a day (BID) | ORAL | Status: DC

## 2023-10-26 MED ORDER — MAGNESIUM SULFATE 40 GM/1000ML IV SOLN
2.0000 g/h | INTRAVENOUS | Status: DC
  Administered 2023-10-26 – 2023-10-27 (×2): 2 g/h via INTRAVENOUS
  Filled 2023-10-26 (×2): qty 1000

## 2023-10-26 MED ORDER — LACTATED RINGERS IV SOLN
500.0000 mL | INTRAVENOUS | Status: DC | PRN
  Administered 2023-10-27: 500 mL via INTRAVENOUS

## 2023-10-26 MED ORDER — MISOPROSTOL 25 MCG QUARTER TABLET
25.0000 ug | ORAL_TABLET | Freq: Once | ORAL | Status: AC
  Administered 2023-10-26: 25 ug via VAGINAL
  Filled 2023-10-26: qty 1

## 2023-10-26 MED ORDER — NIFEDIPINE ER OSMOTIC RELEASE 30 MG PO TB24: 60.0000 mg | ORAL_TABLET | Freq: Every day | ORAL | Status: DC

## 2023-10-26 MED ORDER — FENTANYL CITRATE (PF) 100 MCG/2ML IJ SOLN
50.0000 ug | INTRAMUSCULAR | Status: DC | PRN
  Administered 2023-10-26: 50 ug via INTRAVENOUS
  Filled 2023-10-26: qty 2

## 2023-10-26 NOTE — Progress Notes (Signed)
 L&D Note  10/26/2023 - 9:46 PM  33 y.o. [id number] [redacted]w[redacted]d. Pregnancy complicated by:  Patient Active Problem List   Diagnosis Date Noted   Chronic hypertension affecting pregnancy 10/26/2023   Obesity affecting pregnancy, antepartum 06/13/2023   History of preterm delivery 05/21/2023   Supervision of high risk pregnancy, antepartum 05/01/2023   Trichomoniasis 04/15/2023   Diabetes mellitus affecting pregnancy in first trimester 04/15/2023   Nausea and vomiting in pregnancy 04/15/2023   Subchorionic hemorrhage in first trimester 04/15/2023   Type 2 diabetes mellitus (HCC) 01/08/2023   Chronic hypertension 06/17/2021   Anemia in pregnancy 05/30/2021   Marijuana use 05/24/2021   [name] is admitted for severe pre-eclampsia superimposed on poorly controlled CHTN.    Subjective:  Moderate distress with UCs  Objective:    Current Vital Signs 24h Vital Sign Ranges  T 98.4 F (36.9 C) Temp  Avg: 98.4 F (36.9 C)  Min: 98.4 F (36.9 C)  Max: 98.4 F (36.9 C)  BP 132/68 BP  Min: 120/61  Max: 188/105  HR 89 Pulse  Avg: 86.9  Min: 70  Max: 103  RR 16 Resp  Avg: 14  Min: 12  Max: 16  SaO2 100 % Room Air SpO2  Avg: 99.4 %  Min: 98 %  Max: 100 %       24 Hour I/O Current Shift I/O  Time Ins Outs No intake/output data recorded. No intake/output data recorded.   FHR: category I with tracing Toco: q3-43m  Gen: moderate distress CTAB Normal s1 and s2, no mrgs 2+ brachial SVE: ext os 1cm but int os 2-3/50/ballotable/bow intact  Labs:  Recent Labs  Lab 10/26/23 1535  WBC 6.8  HGB 11.4*  HCT 33.3*  PLT 319   Recent Labs  Lab 10/26/23 1535  NA 136  K 3.3*  CL 103  CO2 22  BUN <5*  CREATININE 0.75  CALCIUM 8.5*  PROT 6.7  BILITOT 0.9  ALKPHOS 109  ALT 15  AST 22  GLUCOSE 111*    Medications Current Facility-Administered Medications  Medication Dose Route Frequency Provider Last Rate Last Admin   acetaminophen (TYLENOL) tablet 650 mg  650 mg Oral Q4H PRN  Leftwich-Kirby, Lisa A, CNM       fentaNYL (SUBLIMAZE) injection 50-100 mcg  50-100 mcg Intravenous Q1H PRN Hessie Dibble, MD       NIFEdipine (PROCARDIA) capsule 10 mg  10 mg Oral PRN Leftwich-Kirby, Lisa A, CNM   10 mg at 10/26/23 1614   And   NIFEdipine (PROCARDIA) capsule 20 mg  20 mg Oral PRN Leftwich-Kirby, Lisa A, CNM       And   NIFEdipine (PROCARDIA) capsule 20 mg  20 mg Oral PRN Leftwich-Kirby, Lisa A, CNM   20 mg at 10/26/23 1807   And   labetalol (NORMODYNE) injection 40 mg  40 mg Intravenous PRN Leftwich-Kirby, Lisa A, CNM       labetalol (NORMODYNE) tablet 100 mg  100 mg Oral BID Wyn Forster, MD   100 mg at 10/26/23 1729   lactated ringers infusion 500-1,000 mL  500-1,000 mL Intravenous PRN Leftwich-Kirby, Lisa A, CNM       lactated ringers infusion   Intravenous Continuous Leftwich-Kirby, Lisa A, CNM   Stopped at 10/26/23 1729   lactated ringers infusion   Intravenous Continuous Wyn Forster, MD 10 mL/hr at 10/26/23 1729 Rate Change at 10/26/23 1729   lidocaine (PF) (XYLOCAINE) 1 % injection 30 mL  30 mL  Subcutaneous PRN Leftwich-Kirby, Lisa A, CNM       magnesium sulfate 40 grams in SWI 1000 mL OB infusion  2 g/hr Intravenous Titrated Wyn Forster, MD 50 mL/hr at 10/26/23 1657 2 g/hr at 10/26/23 1657   NIFEdipine (PROCARDIA-XL/NIFEDICAL-XL) 24 hr tablet 30 mg  30 mg Oral BID Wyn Forster, MD   30 mg at 10/26/23 1728   ondansetron (ZOFRAN) injection 4 mg  4 mg Intravenous Q6H PRN Leftwich-Kirby, Lisa A, CNM   4 mg at 10/26/23 1707   oxytocin (PITOCIN) IV BOLUS FROM BAG  333 mL Intravenous Once Leftwich-Kirby, Lisa A, CNM       oxytocin (PITOCIN) IV infusion 30 units in NS 500 mL - Premix  2.5 Units/hr Intravenous Continuous Leftwich-Kirby, Lisa A, CNM       penicillin G potassium 5 Million Units in sodium chloride 0.9 % 250 mL IVPB  5 Million Units Intravenous Once Frederica Bing, MD       Followed by   Melene Muller ON 10/27/2023] penicillin G potassium 3 Million Units in  dextrose 50mL IVPB  3 Million Units Intravenous Q4H [place] Bing, MD       sodium citrate-citric acid (ORACIT) solution 30 mL  30 mL Oral Q2H PRN Leftwich-Kirby, Lisa A, CNM       terbutaline (BRETHINE) injection 0.25 mg  0.25 mg Subcutaneous Once PRN Wyn Forster, MD        Assessment & Plan:  Patient stable *Pregnancy: fetal status reassuring *Severe pre-eclampsia with CHTN: BPs doing well on Mg and home regimen. Repeat labs and 5a/5p. Watch UOP, patient up to void now *DM2: continue q4h CBGs and q2h when active; normal CBGs thus far *IOL: not picking up but in the room she is contracting about q3-73m so too frequent for another cytotec; pt may be transitioning. Repeat sve in a few hours -s/p cytotec x2 at 1630 *GBS: PCN ordered for h/o GBS in the past and preterm currently and no culture results.  *Analgesia: IV PRNs ordered.   Cornelia Copa MD Attending Center for [facility])

## 2023-10-26 NOTE — Progress Notes (Signed)
 Misty [name] is a 33 y.o. [phone] at [redacted]w[redacted]d here for IOL for CHTN with SIPE w/SF   BP 150/87, HR 83, RR 16, Temp 98.4  EFM: baseline 140, moderate variability, accels present, no decels TOCO: q2-63min contractions   #Labor:Dual cytotec #Pain:  Pt aware of options, declined for now #FWB: Cat I #GBS status: PCR presumptive negative  #CHTN w/SIPE w/SF: Magnesium infusion, home labetalol and nifedipine, q12hr labs  Wyn Forster, MD FMOB Fellow, Faculty practice [facility], Center for [facility]

## 2023-10-26 NOTE — MAU Provider Note (Addendum)
 History     CSN: [id number]  Arrival date and time: 10/26/23 1316   Event Date/Time   First Provider Initiated Contact with Patient 10/26/23 1401      Chief Complaint  Patient presents with   Contractions   Rupture of Membranes   HPI  Misty [name] is a 33 y.o. [id number] at [redacted]w[redacted]d who presents for evaluation of possible rupture of membranes and contractions. Patient reports yellow, thin discharge that she thinks may be her water. She also reports contractions every 2-3 minutes.   Upon arrival to MAU, she was found to have severe range blood pressures. She reports she vomited after taking her Nifedipine this morning. She is unsure when she last took her insulin for type 2 DM.    She denies any HA, visual changes or epigastric pain. She reports normal fetal movement.   OB History     Gravida  4   Para  1   Term  0   Preterm  1   AB  2   Living  1      SAB  2   IAB  0   Ectopic  0   Multiple  0   Live Births  1           Past Medical History:  Diagnosis Date   Abscess 09/02/2020   Allergy    Anemia 2015   Asthma    as child   Chronic hypertension with superimposed preeclampsia 05/24/2021   Diabetes mellitus without complication (HCC) 2015   DKA (diabetic ketoacidosis) (HCC) 09/02/2020   Eczema    History of pre-eclampsia    Hypertension 09/2013   Trichomonas infection 04/2023   UTI (urinary tract infection)     Past Surgical History:  Procedure Laterality Date   Abcess on back     NO PAST SURGERIES      Family History  Problem Relation Age of Onset   Migraines Mother    Hypertension Mother    Heart disease Mother 38       CHF   Diabetes Mother    Healthy Father    Sickle cell anemia Brother    Migraines Maternal Grandmother    Diabetes Maternal Grandmother    Heart disease Maternal Grandmother    Hypertension Maternal Grandfather    Diabetes Maternal Grandfather    Heart disease Maternal Grandfather    Asthma Neg Hx    Cancer  Neg Hx     Social History   Tobacco Use   Smoking status: Former    Current packs/day: 0.00    Average packs/day: 0.5 packs/day for 2.0 years (1.0 ttl pk-yrs)    Types: Cigarettes    Start date: 09/09/2011    Quit date: 09/08/2013    Years since quitting: 10.1   Smokeless tobacco: Never  Vaping Use   Vaping status: Never Used  Substance Use Topics   Alcohol use: No    Alcohol/week: 0.0 standard drinks of alcohol   Drug use: No    Allergies:  Allergies  Allergen Reactions   Iodides Anaphylaxis and Itching   Morphine And Codeine Anaphylaxis and Itching   Shellfish Allergy Anaphylaxis and Itching    Pt reports "watery eyes"   Ultram [Tramadol Hcl] Hives    Hives and swollen lips     Medications Prior to Admission  Medication Sig Dispense Refill Last Dose/Taking   acetaminophen (TYLENOL) 500 MG tablet Take 1 tablet (500 mg total) by mouth every 6 (six) hours  as needed for mild pain (pain score 1-3). 30 tablet 0 10/25/2023   aspirin EC 81 MG tablet Take 1 tablet (81 mg total) by mouth daily. Take after 12 weeks for prevention of preeclampsia later in pregnancy 300 tablet 2 10/26/2023   Blood Glucose Monitoring Suppl DEVI 1 each by Does not apply route in the morning, at noon, and at bedtime. May substitute to any manufacturer covered by patient's insurance. 1 each 0 10/26/2023   Blood Pressure Monitoring (BLOOD PRESSURE MONITOR AUTOMAT) DEVI 1 each by Does not apply route daily. 1 each 0 10/26/2023   Insulin Glargine (BASAGLAR KWIKPEN) 100 UNIT/ML Inject 18 Units into the skin 2 (two) times daily.   10/26/2023   labetalol (NORMODYNE) 100 MG tablet TAKE 1 TABLET BY MOUTH 2 TIMES DAILY 180 tablet 1 10/26/2023   metoCLOPramide (REGLAN) 10 MG tablet Take 1 tablet (10 mg total) by mouth 4 (four) times daily as needed for nausea or vomiting. 30 tablet 2 10/26/2023   NIFEdipine (PROCARDIA XL/NIFEDICAL XL) 60 MG 24 hr tablet Take 60 mg by mouth daily.   10/26/2023   ondansetron (ZOFRAN) 4 MG tablet  Take 1 tablet (4 mg total) by mouth every 8 (eight) hours as needed for nausea or vomiting. 30 tablet 0 10/26/2023   Prenatal Vit-Fe Fumarate-FA (PRENATAL VITAMIN) 27-0.8 MG TABS Take 1 tablet by mouth daily. 30 tablet 11 10/26/2023   scopolamine (TRANSDERM-SCOP) 1 MG/3DAYS Place 1 patch (1.5 mg total) onto the skin every 3 (three) days. 10 patch 12 10/26/2023    Review of Systems  Constitutional: Negative.  Negative for fatigue and fever.  HENT: Negative.    Respiratory: Negative.  Negative for shortness of breath.   Cardiovascular: Negative.  Negative for chest pain.  Gastrointestinal: Negative.  Negative for abdominal pain, constipation, diarrhea, nausea and vomiting.  Genitourinary: Negative.  Negative for dysuria.  Neurological: Negative.  Negative for dizziness and headaches.   Physical Exam   Blood pressure (!) 168/95, pulse 75, temperature 98.4 F (36.9 C), temperature source Oral, resp. rate 12, weight 102.2 kg, last menstrual period 02/10/2023, SpO2 98%.  Patient Vitals for the past 24 hrs:  BP Temp Temp src Pulse Resp SpO2 Weight  10/26/23 1413 (!) 168/95 -- -- 75 -- -- --  10/26/23 1358 (!) 188/105 -- -- 80 -- -- --  10/26/23 1328 (!) 177/87 98.4 F (36.9 C) Oral 87 12 98 % 102.2 kg    Physical Exam Vitals and nursing note reviewed.  Constitutional:      General: She is not in acute distress.    Appearance: She is well-developed.  HENT:     Head: Normocephalic.  Eyes:     Pupils: Pupils are equal, round, and reactive to light.  Cardiovascular:     Rate and Rhythm: Normal rate and regular rhythm.     Heart sounds: Normal heart sounds.  Pulmonary:     Effort: Pulmonary effort is normal. No respiratory distress.     Breath sounds: Normal breath sounds.  Abdominal:     General: Bowel sounds are normal. There is no distension.     Palpations: Abdomen is soft.     Tenderness: There is no abdominal tenderness.  Skin:    General: Skin is warm and dry.  Neurological:      Mental Status: She is alert and oriented to person, place, and time.     Motor: No abnormal muscle tone.     Coordination: Coordination normal.     Deep  Tendon Reflexes: Reflexes are normal and symmetric. Reflexes normal.  Psychiatric:        Behavior: Behavior normal.        Thought Content: Thought content normal.        Judgment: Judgment normal.     Fetal Tracing:  Baseline: 140  Variability: moderate Accels: 15x15 Decels: none  Toco: occasional uc's      MAU Course  Procedures  Results for orders placed or performed during the hospital encounter of 10/26/23 (from the past 24 hours)  Fern Test     Status: Abnormal   Collection Time: 10/26/23  2:06 PM  Result Value Ref Range   POCT Fern Test Negative = intact amniotic membranes (NE)      MDM Labs ordered and reviewed.   Preeclampsia order set CBC, CMP, PCR ROM+ GBS PCR  Dr. Darra Lis notified of patient arrival with persistent severe range BPs- MD [facility] with SIPE  Assessment and Plan  Chronic Hypertension with Superimposed Preeclampisa with Severe Features.   -Admit to labor and delivery -Report called to labor team  Rolm Bookbinder, CNM 10/26/2023, 2:01 PM   I provided general supervision for this patient and was immediately available for any patient care concerns. Ziyad Dyar [employer]

## 2023-10-26 NOTE — Anesthesia Procedure Notes (Signed)
 Epidural Patient location during procedure: OB Start time: 10/26/2023 11:40 PM End time: 10/26/2023 11:42 PM  Staffing Anesthesiologist: Beryle Lathe, MD Performed: anesthesiologist   Preanesthetic Checklist Completed: patient identified, IV checked, risks and benefits discussed, monitors and equipment checked, pre-op evaluation and timeout performed  Epidural Patient position: sitting Prep: DuraPrep Patient monitoring: continuous pulse ox and blood pressure Approach: midline Location: L1-L2 Injection technique: LOR saline  Needle:  Needle type: Tuohy  Needle gauge: 17 G Needle length: 9 cm Needle insertion depth: 8 cm Catheter size: 19 Gauge Catheter at skin depth: 13 cm Test dose: negative and Other (1% lidocaine)  Assessment Events: blood not aspirated and no cerebrospinal fluid  Additional Notes Patient identified. Risks including, but not limited to, bleeding, infection, nerve damage, paralysis, inadequate analgesia, blood pressure changes, nausea, vomiting, allergic reaction, postpartum back pain, itching, and headache were discussed. Patient expressed understanding and wished to proceed. Sterile prep and drape, including hand hygiene, mask, and sterile gloves were used. The patient was positioned and the spine was prepped. The skin was anesthetized with lidocaine. No paraesthesia or other complication noted. The patient did not experience any signs of intravascular injection such as tinnitus or metallic taste in mouth, nor signs of intrathecal spread such as rapid motor block. Please see nursing notes for vital signs. The patient tolerated the procedure well.   Leslye Peer, MDReason for block:procedure for pain

## 2023-10-26 NOTE — H&P (Addendum)
 OBSTETRIC ADMISSION HISTORY AND PHYSICAL  Misty [name] is a 33 y.o. female [phone] with IUP at [redacted]w[redacted]d by 7 week [place] presenting for concern for SROM, Fern negative, but severe BP in MAU. She reports +FMs, No LOF, no VB, no blurry vision, headaches or peripheral edema, and RUQ pain.  She plans on breast feeding. She decline birth control. She received her prenatal care at  [facility]    Dating: By 7 week USg --->  Estimated Date of Delivery: 11/17/23  Sono:   @[redacted]w[redacted]d , CWD, normal anatomy, cephalic presentation, anterior placental lie, 1898g, 33% EFW  Prenatal History/Complications:  - T2DM on Glargine 18u BID; A1C 7.7 - CHTN with SIPE w/SF; noncompliant with labetalol 100mg  BID and Nifedipine 60mg  daily - Normocytic anemia, Hb 11.0 - Obesity BMI 40s - Trichomonas positive (11/5, 2/4) - Bacterial vaginitis (11/5, 11/14)  Past Medical History: Past Medical History:  Diagnosis Date   Abscess 09/02/2020   Allergy    Anemia 2015   Asthma    as child   Chronic hypertension with superimposed preeclampsia 05/24/2021   Diabetes mellitus without complication (HCC) 2015   DKA (diabetic ketoacidosis) (HCC) 09/02/2020   Eczema    History of pre-eclampsia    Hypertension 09/2013   Trichomonas infection 04/2023   UTI (urinary tract infection)     Past Surgical History: Past Surgical History:  Procedure Laterality Date   Abcess on back     NO PAST SURGERIES      Obstetrical History: OB History     Gravida  4   Para  1   Term  0   Preterm  1   AB  2   Living  1      SAB  2   IAB  0   Ectopic  0   Multiple  0   Live Births  1           Social History Social History   Socioeconomic History   Marital status: Single    Spouse name: Not on file   Number of children: Not on file   Years of education: Not on file   Highest education level: Not on file  Occupational History   Not on file  Tobacco Use   Smoking status: Former    Current packs/day: 0.00    Average  packs/day: 0.5 packs/day for 2.0 years (1.0 ttl pk-yrs)    Types: Cigarettes    Start date: 09/09/2011    Quit date: 09/08/2013    Years since quitting: 10.1   Smokeless tobacco: Never  Vaping Use   Vaping status: Never Used  Substance and Sexual Activity   Alcohol use: No    Alcohol/week: 0.0 standard drinks of alcohol   Drug use: No   Sexual activity: Not Currently    Birth control/protection: None    Comment: 1st intercourse 75 yo--1 partner ( has been same partner 3 yrs)   Other Topics Concern   Not on file  Social History Narrative   Works at [employer] Strain: Not on file  Food Insecurity: No Food Insecurity (10/26/2023)   Hunger Vital Sign    Worried About Running Out of Food in the Last Year: Never true    Ran Out of Food in the Last Year: Never true  Recent Concern: Food Insecurity - Food Insecurity Present (10/01/2023)   Hunger Vital Sign    Worried About Running Out of Food in the  Last Year: Sometimes true    Ran Out of Food in the Last Year: Sometimes true  Transportation Needs: No Transportation Needs (10/26/2023)   PRAPARE - Administrator, Civil Service (Medical): No    Lack of Transportation (Non-Medical): No  Physical Activity: Not on file  Stress: Not on file  Social Connections: Unknown (10/26/2023)   Social Connection and Isolation Panel [NHANES]    Frequency of Communication with Friends and Family: More than three times a week    Frequency of Social Gatherings with Friends and Family: More than three times a week    Attends Religious Services: Patient declined    Database administrator or Organizations: Patient declined    Attends Engineer, structural: Patient declined    Marital Status: Patient declined    Family History: Family History  Problem Relation Age of Onset   Migraines Mother    Hypertension Mother    Heart disease Mother 86       CHF   Diabetes Mother    Healthy Father     Sickle cell anemia Brother    Migraines Maternal Grandmother    Diabetes Maternal Grandmother    Heart disease Maternal Grandmother    Hypertension Maternal Grandfather    Diabetes Maternal Grandfather    Heart disease Maternal Grandfather    Asthma Neg Hx    Cancer Neg Hx     Allergies: Allergies  Allergen Reactions   Iodides Anaphylaxis and Itching   Morphine And Codeine Anaphylaxis and Itching   Shellfish Allergy Anaphylaxis and Itching    Pt reports "watery eyes"   Ultram [Tramadol Hcl] Hives    Hives and swollen lips     Medications Prior to Admission  Medication Sig Dispense Refill Last Dose/Taking   acetaminophen (TYLENOL) 500 MG tablet Take 1 tablet (500 mg total) by mouth every 6 (six) hours as needed for mild pain (pain score 1-3). 30 tablet 0 10/25/2023   aspirin EC 81 MG tablet Take 1 tablet (81 mg total) by mouth daily. Take after 12 weeks for prevention of preeclampsia later in pregnancy 300 tablet 2 10/26/2023   Blood Glucose Monitoring Suppl DEVI 1 each by Does not apply route in the morning, at noon, and at bedtime. May substitute to any manufacturer covered by patient's insurance. 1 each 0 10/26/2023   Blood Pressure Monitoring (BLOOD PRESSURE MONITOR AUTOMAT) DEVI 1 each by Does not apply route daily. 1 each 0 10/26/2023   Insulin Glargine (BASAGLAR KWIKPEN) 100 UNIT/ML Inject 18 Units into the skin 2 (two) times daily.   10/26/2023   labetalol (NORMODYNE) 100 MG tablet TAKE 1 TABLET BY MOUTH 2 TIMES DAILY 180 tablet 1 10/26/2023   metoCLOPramide (REGLAN) 10 MG tablet Take 1 tablet (10 mg total) by mouth 4 (four) times daily as needed for nausea or vomiting. 30 tablet 2 10/26/2023   NIFEdipine (PROCARDIA XL/NIFEDICAL XL) 60 MG 24 hr tablet Take 60 mg by mouth daily.   10/26/2023   ondansetron (ZOFRAN) 4 MG tablet Take 1 tablet (4 mg total) by mouth every 8 (eight) hours as needed for nausea or vomiting. 30 tablet 0 10/26/2023   Prenatal Vit-Fe Fumarate-FA (PRENATAL VITAMIN)  27-0.8 MG TABS Take 1 tablet by mouth daily. 30 tablet 11 10/26/2023   scopolamine (TRANSDERM-SCOP) 1 MG/3DAYS Place 1 patch (1.5 mg total) onto the skin every 3 (three) days. 10 patch 12 10/26/2023     Review of Systems   All systems reviewed and  negative except as stated in HPI  Blood pressure (!) 177/87, pulse 87, temperature 98.4 F (36.9 C), temperature source Oral, resp. rate 12, weight 102.2 kg, last menstrual period 02/10/2023, SpO2 98%. General appearance: alert, cooperative, appears stated age, and mild distress Lungs: clear to auscultation bilaterally Heart: regular rate and rhythm Abdomen: soft, non-tender; bowel sounds normal Pelvic: adequate, proven to 2100g Extremities: Homans sign is negative, no sign of DVT DTR's 2+ Presentation: cephalic Fetal monitoringBaseline: 150 bpm, Variability: Good {> 6 bpm), Accelerations: Reactive, and Decelerations: Absent Uterine activityFrequency: Every 2-10 minutes     Prenatal labs: ABO, Rh: O/Positive/-- (08/28 1137) Antibody: Negative (08/28 1137) Rubella: 1.05 (08/28 1137) RPR: Non Reactive (08/28 1137)  HBsAg: Negative (08/28 1137)  HIV: Non Reactive (08/28 1137)  GBS:     No results found for: "GBS" GTT abnormal; T2DM Genetic screening  LR panorama, Negative horizon Anatomy US normal  Immunization History  Administered Date(s) Administered   DTaP 11/10/1990, 01/12/1991, 03/19/1991, 12/29/1991, 01/22/1995   HIB (PRP-T) 11/10/1990, 01/12/1991, 03/19/1991, 12/29/1991   HPV Quadrivalent 06/13/2005, 09/11/2005, 12/13/2005   Hepatitis A, Ped/Adol-2 Dose 05/15/2005, 12/14/2005   Hepatitis B, PED/ADOLESCENT 01/22/1995, 03/27/1995, 08/30/1995, 05/06/2002, 02/02/2003   IPV 11/10/1990, 01/12/1991, 12/29/1991, 01/22/1995   Influenza, Seasonal, Injecte, Preservative Fre 09/30/2015, 05/21/2023   Influenza,inj,Quad PF,6+ Mos 11/30/2013, 09/17/2014, 09/04/2016, 06/26/2017, 05/05/2021, 06/01/2022   MMR 12/29/1991, 01/22/1995    Meningococcal Conjugate 04/18/2005   PPD Test 12/11/2013, 03/19/2014, 10/24/2022, 11/07/2022   Tdap 10/17/2007, 09/28/2020, 05/05/2021    Prenatal Transfer Tool  Maternal Diabetes: Yes:  Diabetes Type:  Pre-pregnancy, Insulin/Medication controlled Genetic Screening: Normal Maternal Ultrasounds/Referrals: Normal Fetal Ultrasounds or other Referrals:  Fetal echo - Normal Maternal Substance Abuse:  No Significant Maternal Medications:  None Significant Maternal Lab Results: Other: GBS unknown Number of Prenatal Visits:greater than 3 verified prenatal visits Maternal Vaccinations:Flu Other Comments:  None   Results for orders placed or performed during the hospital encounter of 10/26/23 (from the past 24 hours)  Fern Test   Collection Time: 10/26/23  2:06 PM  Result Value Ref Range   POCT Fern Test Negative = intact amniotic membranes (NE)     Patient Active Problem List   Diagnosis Date Noted   Obesity affecting pregnancy, antepartum 06/13/2023   History of preterm delivery 05/21/2023   Supervision of high risk pregnancy, antepartum 05/01/2023   Trichomoniasis 04/15/2023   Diabetes mellitus affecting pregnancy in first trimester 04/15/2023   Nausea and vomiting in pregnancy 04/15/2023   Subchorionic hemorrhage in first trimester 04/15/2023   Type 2 diabetes mellitus (HCC) 01/08/2023   Chronic hypertension 06/17/2021   Anemia in pregnancy 05/30/2021   Marijuana use 05/24/2021    Assessment/Plan:  Misty [name] is a 33 y.o. [name] at [redacted]w[redacted]d here for IOL for CHTN with SIPE w/SF   #Labor:Induction with Cytotec and FB as able. AROM & Pitocin augmentation as indicated #Pain: Pt aware of options #FWB: Cat I #GBS status:  Unknown, PCR pending #Feeding: Breastmilk  #Reproductive Life planning:  Declined #Circ:  yes  Wyn Forster, MD  10/26/2023, 2:47 PM  I provided general supervision for this patient and was immediately available for any patient care concerns. Carvel Huskins  [employer]

## 2023-10-26 NOTE — Anesthesia Preprocedure Evaluation (Signed)
 Anesthesia Evaluation  Patient identified by MRN, date of birth, ID band Patient awake    Reviewed: Allergy & Precautions, NPO status , Patient's Chart, lab work & pertinent test results  History of Anesthesia Complications Negative for: history of anesthetic complications  Airway Mallampati: II   Neck ROM: Full    Dental   Pulmonary asthma , former smoker   Pulmonary exam normal        Cardiovascular hypertension, Pt. on medications and Pt. on home beta blockers Normal cardiovascular exam     Neuro/Psych negative neurological ROS  negative psych ROS   GI/Hepatic negative GI ROS, Neg liver ROS,,,  Endo/Other  diabetes, Type 2, Insulin Dependent   Na 132 K 3.3 Ca 7.9   Renal/GU negative Renal ROS     Musculoskeletal negative musculoskeletal ROS (+)    Abdominal   Peds  Hematology  (+) Blood dyscrasia, anemia  Plt 314k    Anesthesia Other Findings   Reproductive/Obstetrics (+) Pregnancy  Hx Pre-E                              Anesthesia Physical Anesthesia Plan  ASA: 3  Anesthesia Plan: Epidural   Post-op Pain Management: Minimal or no pain anticipated   Induction:   PONV Risk Score and Plan: 2 and Treatment may vary due to age or medical condition  Airway Management Planned: Natural Airway  Additional Equipment: None  Intra-op Plan:   Post-operative Plan:   Informed Consent: I have reviewed the patients History and Physical, chart, labs and discussed the procedure including the risks, benefits and alternatives for the proposed anesthesia with the patient or authorized representative who has indicated his/her understanding and acceptance.       Plan Discussed with: Anesthesiologist  Anesthesia Plan Comments: (Labs reviewed. Platelets acceptable, patient not taking any blood thinning medications. Per RN, FHR tracing reported to be stable enough for sitting  procedure. Risks and benefits discussed with patient, including PDPH, backache, epidural hematoma, failed epidural, blood pressure changes, allergic reaction, and nerve injury. Patient expressed understanding and wished to proceed.)       Anesthesia Quick Evaluation

## 2023-10-26 NOTE — MAU Note (Signed)
.  Misty [name] is a 33 y.o. at [redacted]w[redacted]d here in MAU reporting: Patient reports she thinks her water broke clear fluid at 1000 last night. Patient reports gush of fluid and has been leaking since. Patient reports ctx's every 2-3 minutes. +FM denies vaginal bleeding   Onset of complaint:  Pain score: 9/10 ctx There were no vitals filed for this visit.    Lab orders placed from triage:   none

## 2023-10-27 ENCOUNTER — Inpatient Hospital Stay (HOSPITAL_COMMUNITY): Payer: 59

## 2023-10-27 ENCOUNTER — Inpatient Hospital Stay (HOSPITAL_COMMUNITY): Admission: RE | Admit: 2023-10-27 | Payer: 59 | Source: Home / Self Care | Admitting: Obstetrics and Gynecology

## 2023-10-27 ENCOUNTER — Encounter (HOSPITAL_COMMUNITY)
Admission: AD | Disposition: A | Discharge status: Home / Self Care | Payer: Self-pay | Source: Home / Self Care | Attending: Obstetrics and Gynecology

## 2023-10-27 ENCOUNTER — Encounter (HOSPITAL_COMMUNITY): Payer: Self-pay | Admitting: Maternal & Fetal Medicine

## 2023-10-27 DIAGNOSIS — O4202 Full-term premature rupture of membranes, onset of labor within 24 hours of rupture: Secondary | ICD-10-CM | POA: Diagnosis not present

## 2023-10-27 DIAGNOSIS — O1414 Severe pre-eclampsia complicating childbirth: Secondary | ICD-10-CM | POA: Diagnosis not present

## 2023-10-27 DIAGNOSIS — Z3A00 Weeks of gestation of pregnancy not specified: Secondary | ICD-10-CM

## 2023-10-27 DIAGNOSIS — Z3A36 36 weeks gestation of pregnancy: Secondary | ICD-10-CM

## 2023-10-27 DIAGNOSIS — O24424 Gestational diabetes mellitus in childbirth, insulin controlled: Secondary | ICD-10-CM | POA: Diagnosis not present

## 2023-10-27 DIAGNOSIS — O99214 Obesity complicating childbirth: Secondary | ICD-10-CM | POA: Diagnosis not present

## 2023-10-27 DIAGNOSIS — O9832 Other infections with a predominantly sexual mode of transmission complicating childbirth: Secondary | ICD-10-CM

## 2023-10-27 LAB — GLUCOSE, CAPILLARY
Glucose-Capillary: 155 mg/dL — ABNORMAL HIGH (ref 70–99)
Glucose-Capillary: 232 mg/dL — ABNORMAL HIGH (ref 70–99)
Glucose-Capillary: 154 mg/dL — ABNORMAL HIGH (ref 70–99)
Glucose-Capillary: 149 mg/dL — ABNORMAL HIGH (ref 70–99)
Glucose-Capillary: 140 mg/dL — ABNORMAL HIGH (ref 70–99)
Glucose-Capillary: 119 mg/dL — ABNORMAL HIGH (ref 70–99)
Glucose-Capillary: 124 mg/dL — ABNORMAL HIGH (ref 70–99)
Glucose-Capillary: 120 mg/dL — ABNORMAL HIGH (ref 70–99)
Glucose-Capillary: 117 mg/dL — ABNORMAL HIGH (ref 70–99)
Glucose-Capillary: 98 mg/dL (ref 70–99)
Glucose-Capillary: 122 mg/dL — ABNORMAL HIGH (ref 70–99)
Glucose-Capillary: 132 mg/dL — ABNORMAL HIGH (ref 70–99)
Glucose-Capillary: 146 mg/dL — ABNORMAL HIGH (ref 70–99)
Glucose-Capillary: 165 mg/dL — ABNORMAL HIGH (ref 70–99)
Glucose-Capillary: 183 mg/dL — ABNORMAL HIGH (ref 70–99)
Glucose-Capillary: 203 mg/dL — ABNORMAL HIGH (ref 70–99)
Glucose-Capillary: 207 mg/dL — ABNORMAL HIGH (ref 70–99)
Glucose-Capillary: 217 mg/dL — ABNORMAL HIGH (ref 70–99)

## 2023-10-27 LAB — CBC
HCT: 39.3 % (ref 36.0–46.0)
HCT: 33.3 % — ABNORMAL LOW (ref 36.0–46.0)
HCT: 32.3 % — ABNORMAL LOW (ref 36.0–46.0)
Hemoglobin: 13.3 g/dL (ref 12.0–15.0)
Hemoglobin: 11.5 g/dL — ABNORMAL LOW (ref 12.0–15.0)
Hemoglobin: 11.2 g/dL — ABNORMAL LOW (ref 12.0–15.0)
MCH: 31.7 pg (ref 26.0–34.0)
MCH: 31.8 pg (ref 26.0–34.0)
MCH: 31.7 pg (ref 26.0–34.0)
MCHC: 33.8 g/dL (ref 30.0–36.0)
MCHC: 34.5 g/dL (ref 30.0–36.0)
MCHC: 34.7 g/dL (ref 30.0–36.0)
MCV: 93.8 fL (ref 80.0–100.0)
MCV: 92 fL (ref 80.0–100.0)
MCV: 91.5 fL (ref 80.0–100.0)
Platelets: 286 10*3/uL (ref 150–400)
Platelets: 323 10*3/uL (ref 150–400)
Platelets: 326 10*3/uL (ref 150–400)
RBC: 4.19 MIL/uL (ref 3.87–5.11)
RBC: 3.62 MIL/uL — ABNORMAL LOW (ref 3.87–5.11)
RBC: 3.53 MIL/uL — ABNORMAL LOW (ref 3.87–5.11)
RDW: 13.8 % (ref 11.5–15.5)
RDW: 13.8 % (ref 11.5–15.5)
RDW: 13.6 % (ref 11.5–15.5)
WBC: 6.7 10*3/uL (ref 4.0–10.5)
WBC: 13.1 10*3/uL — ABNORMAL HIGH (ref 4.0–10.5)
WBC: 14.2 10*3/uL — ABNORMAL HIGH (ref 4.0–10.5)
nRBC: 0 % (ref 0.0–0.2)
nRBC: 0 % (ref 0.0–0.2)
nRBC: 0 % (ref 0.0–0.2)

## 2023-10-27 LAB — COMPREHENSIVE METABOLIC PANEL
ALT: 17 U/L (ref 0–44)
ALT: 14 U/L (ref 0–44)
AST: 23 U/L (ref 15–41)
AST: 25 U/L (ref 15–41)
Albumin: 2.5 g/dL — ABNORMAL LOW (ref 3.5–5.0)
Albumin: 2.1 g/dL — ABNORMAL LOW (ref 3.5–5.0)
Alkaline Phosphatase: 115 U/L (ref 38–126)
Alkaline Phosphatase: 103 U/L (ref 38–126)
Anion gap: 13 (ref 5–15)
Anion gap: 11 (ref 5–15)
BUN: <5 mg/dL — ABNORMAL LOW (ref 6–20)
BUN: <5 mg/dL — ABNORMAL LOW (ref 6–20)
CO2: 21 mmol/L — ABNORMAL LOW (ref 22–32)
CO2: 21 mmol/L — ABNORMAL LOW (ref 22–32)
Calcium: 8.2 mg/dL — ABNORMAL LOW (ref 8.9–10.3)
Calcium: 7.6 mg/dL — ABNORMAL LOW (ref 8.9–10.3)
Chloride: 98 mmol/L (ref 98–111)
Chloride: 100 mmol/L (ref 98–111)
Creatinine, Ser: 1.05 mg/dL — ABNORMAL HIGH (ref 0.44–1.00)
Creatinine, Ser: 1.11 mg/dL — ABNORMAL HIGH (ref 0.44–1.00)
GFR, Estimated: >60 mL/min (ref >60)
GFR, Estimated: >60 mL/min (ref >60)
Glucose, Bld: 145 mg/dL — ABNORMAL HIGH (ref 70–99)
Glucose, Bld: 144 mg/dL — ABNORMAL HIGH (ref 70–99)
Potassium: 3.2 mmol/L — ABNORMAL LOW (ref 3.5–5.1)
Potassium: 3.6 mmol/L (ref 3.5–5.1)
Sodium: 132 mmol/L — ABNORMAL LOW (ref 135–145)
Sodium: 132 mmol/L — ABNORMAL LOW (ref 135–145)
Total Bilirubin: 1 mg/dL (ref 0.0–1.2)
Total Bilirubin: 0.7 mg/dL (ref 0.0–1.2)
Total Protein: 7.4 g/dL (ref 6.5–8.1)
Total Protein: 6.4 g/dL — ABNORMAL LOW (ref 6.5–8.1)

## 2023-10-27 LAB — HEMOGLOBIN A1C
Hgb A1c MFr Bld: 6.2 % — ABNORMAL HIGH (ref 4.8–5.6)
Mean Plasma Glucose: 131.24 mg/dL

## 2023-10-27 LAB — RPR
RPR Ser Ql: REACTIVE — AB
RPR Titer: 1:32 {titer}

## 2023-10-27 LAB — MAGNESIUM
Magnesium: 6.2 mg/dL (ref 1.7–2.4)
Magnesium: 6.3 mg/dL (ref 1.7–2.4)

## 2023-10-27 SURGERY — Surgical Case
Anesthesia: Epidural

## 2023-10-27 MED ORDER — DEXAMETHASONE SODIUM PHOSPHATE 10 MG/ML IJ SOLN
INTRAMUSCULAR | Status: AC
  Filled 2023-10-27: qty 1

## 2023-10-27 MED ORDER — OXYTOCIN-SODIUM CHLORIDE 30-0.9 UT/500ML-% IV SOLN: 2.5000 [IU]/h | INTRAVENOUS | Status: AC

## 2023-10-27 MED ORDER — LACTATED RINGERS IV SOLN: INTRAVENOUS | Status: DC

## 2023-10-27 MED ORDER — INSULIN GLARGINE-YFGN 100 UNIT/ML ~~LOC~~ SOLN: 10.0000 [IU] | SUBCUTANEOUS | Status: DC

## 2023-10-27 MED ORDER — PRENATAL MULTIVITAMIN CH
1.0000 | ORAL_TABLET | Freq: Every day | ORAL | Status: DC
  Administered 2023-10-28 – 2023-10-29 (×2): 1 via ORAL
  Filled 2023-10-27 (×2): qty 1

## 2023-10-27 MED ORDER — HYDROMORPHONE HCL 1 MG/ML IJ SOLN: 0.2500 mg | INTRAMUSCULAR | Status: DC | PRN

## 2023-10-27 MED ORDER — TRANEXAMIC ACID-NACL 1000-0.7 MG/100ML-% IV SOLN
INTRAVENOUS | Status: DC | PRN
  Administered 2023-10-27: 1000 mg via INTRAVENOUS

## 2023-10-27 MED ORDER — KETOROLAC TROMETHAMINE 30 MG/ML IJ SOLN
INTRAMUSCULAR | Status: DC | PRN
  Administered 2023-10-27: 30 mg via INTRAVENOUS

## 2023-10-27 MED ORDER — KETOROLAC TROMETHAMINE 30 MG/ML IJ SOLN
30.0000 mg | Freq: Four times a day (QID) | INTRAMUSCULAR | Status: AC
  Administered 2023-10-27 – 2023-10-28 (×4): 30 mg via INTRAVENOUS
  Filled 2023-10-27 (×4): qty 1

## 2023-10-27 MED ORDER — DIPHENHYDRAMINE HCL 25 MG PO CAPS: 25.0000 mg | ORAL_CAPSULE | Freq: Four times a day (QID) | ORAL | Status: DC | PRN

## 2023-10-27 MED ORDER — MEDROXYPROGESTERONE ACETATE 150 MG/ML IM SUSP: 150.0000 mg | INTRAMUSCULAR | Status: DC | PRN

## 2023-10-27 MED ORDER — SODIUM CHLORIDE 0.9 % IV SOLN: 12.5000 mg | INTRAVENOUS | Status: DC | PRN

## 2023-10-27 MED ORDER — CEFAZOLIN SODIUM-DEXTROSE 2-3 GM-%(50ML) IV SOLR
INTRAVENOUS | Status: DC | PRN
  Administered 2023-10-27: 2 g via INTRAVENOUS

## 2023-10-27 MED ORDER — FUROSEMIDE 20 MG PO TABS
20.0000 mg | ORAL_TABLET | Freq: Every day | ORAL | Status: DC
  Administered 2023-10-29 – 2023-10-30 (×2): 20 mg via ORAL
  Filled 2023-10-27 (×2): qty 1

## 2023-10-27 MED ORDER — TERBUTALINE SULFATE 1 MG/ML IJ SOLN
0.2500 mg | Freq: Once | INTRAMUSCULAR | Status: AC | PRN
  Administered 2023-10-27: 0.25 mg via SUBCUTANEOUS

## 2023-10-27 MED ORDER — LACTATED RINGERS IV SOLN: INTRAVENOUS | Status: DC | PRN

## 2023-10-27 MED ORDER — OXYTOCIN-SODIUM CHLORIDE 30-0.9 UT/500ML-% IV SOLN
1.0000 m[IU]/min | INTRAVENOUS | Status: DC
Start: 2023-10-27 — End: 2023-10-27

## 2023-10-27 MED ORDER — FUROSEMIDE 10 MG/ML IJ SOLN: 10.0000 mg | Freq: Every day | INTRAMUSCULAR | Status: DC

## 2023-10-27 MED ORDER — SENNOSIDES-DOCUSATE SODIUM 8.6-50 MG PO TABS: 2.0000 | ORAL_TABLET | Freq: Every evening | ORAL | Status: DC | PRN

## 2023-10-27 MED ORDER — LIDOCAINE-EPINEPHRINE (PF) 2 %-1:200000 IJ SOLN
INTRAMUSCULAR | Status: AC
Start: 2023-10-27 — End: ?
  Filled 2023-10-27: qty 20

## 2023-10-27 MED ORDER — MORPHINE SULFATE (PF) 0.5 MG/ML IJ SOLN
INTRAMUSCULAR | Status: AC
  Filled 2023-10-27: qty 10

## 2023-10-27 MED ORDER — CEFAZOLIN SODIUM-DEXTROSE 2-4 GM/100ML-% IV SOLN
INTRAVENOUS | Status: AC
  Filled 2023-10-27: qty 100

## 2023-10-27 MED ORDER — POTASSIUM CHLORIDE 20 MEQ PO PACK
40.0000 meq | PACK | Freq: Once | ORAL | Status: AC
  Administered 2023-10-27: 40 meq via ORAL
  Filled 2023-10-27: qty 2

## 2023-10-27 MED ORDER — FENTANYL CITRATE (PF) 100 MCG/2ML IJ SOLN
INTRAMUSCULAR | Status: DC | PRN
  Administered 2023-10-27: 100 ug via EPIDURAL

## 2023-10-27 MED ORDER — ACETAMINOPHEN 10 MG/ML IV SOLN
INTRAVENOUS | Status: AC
  Filled 2023-10-27: qty 100

## 2023-10-27 MED ORDER — ACETAMINOPHEN 500 MG PO TABS
1000.0000 mg | ORAL_TABLET | Freq: Four times a day (QID) | ORAL | Status: DC
  Administered 2023-10-27 – 2023-10-30 (×10): 1000 mg via ORAL
  Filled 2023-10-27 (×10): qty 2

## 2023-10-27 MED ORDER — POLYETHYLENE GLYCOL 3350 17 G PO PACK
17.0000 g | PACK | Freq: Every day | ORAL | Status: DC
  Administered 2023-10-28 – 2023-10-29 (×2): 17 g via ORAL
  Filled 2023-10-27 (×2): qty 1

## 2023-10-27 MED ORDER — KETOROLAC TROMETHAMINE 30 MG/ML IJ SOLN
INTRAMUSCULAR | Status: AC
  Filled 2023-10-27: qty 1

## 2023-10-27 MED ORDER — ONDANSETRON HCL 4 MG/2ML IJ SOLN
INTRAMUSCULAR | Status: AC
  Filled 2023-10-27: qty 2

## 2023-10-27 MED ORDER — DEXTROSE 50 % IV SOLN: 0.0000 mL | INTRAVENOUS | Status: DC | PRN

## 2023-10-27 MED ORDER — DIPHENHYDRAMINE HCL 50 MG/ML IJ SOLN
INTRAMUSCULAR | Status: AC
  Filled 2023-10-27: qty 1

## 2023-10-27 MED ORDER — LABETALOL HCL 100 MG PO TABS
100.0000 mg | ORAL_TABLET | Freq: Two times a day (BID) | ORAL | Status: DC
  Administered 2023-10-27 – 2023-10-29 (×4): 100 mg via ORAL
  Filled 2023-10-27 (×4): qty 1

## 2023-10-27 MED ORDER — IBUPROFEN 600 MG PO TABS
600.0000 mg | ORAL_TABLET | Freq: Four times a day (QID) | ORAL | Status: DC
  Administered 2023-10-29 – 2023-10-30 (×6): 600 mg via ORAL
  Filled 2023-10-27 (×7): qty 1

## 2023-10-27 MED ORDER — LACTATED RINGERS IV BOLUS: 500.0000 mL | Freq: Once | INTRAVENOUS | Status: DC

## 2023-10-27 MED ORDER — OXYCODONE HCL 5 MG PO TABS
5.0000 mg | ORAL_TABLET | ORAL | Status: DC | PRN
  Administered 2023-10-27: 5 mg via ORAL
  Administered 2023-10-28 – 2023-10-29 (×2): 10 mg via ORAL
  Administered 2023-10-29: 5 mg via ORAL
  Administered 2023-10-30: 10 mg via ORAL
  Filled 2023-10-27 (×2): qty 2
  Filled 2023-10-27: qty 1
  Filled 2023-10-27 (×2): qty 2

## 2023-10-27 MED ORDER — INSULIN ASPART 100 UNIT/ML IJ SOLN
4.0000 [IU] | Freq: Once | INTRAMUSCULAR | Status: AC
  Administered 2023-10-27: 4 [IU] via SUBCUTANEOUS

## 2023-10-27 MED ORDER — POTASSIUM CHLORIDE 20 MEQ PO PACK
20.0000 meq | PACK | Freq: Every day | ORAL | Status: DC
Start: 2023-10-28 — End: 2023-11-02
  Filled 2023-10-27: qty 1

## 2023-10-27 MED ORDER — ONDANSETRON HCL 4 MG/2ML IJ SOLN
INTRAMUSCULAR | Status: DC | PRN
  Administered 2023-10-27: 4 mg via INTRAVENOUS

## 2023-10-27 MED ORDER — LABETALOL HCL 5 MG/ML IV SOLN
20.0000 mg | INTRAVENOUS | Status: DC | PRN
  Administered 2023-10-27 – 2023-10-28 (×2): 20 mg via INTRAVENOUS
  Filled 2023-10-27 (×2): qty 4

## 2023-10-27 MED ORDER — COCONUT OIL OIL: 1.0000 | TOPICAL_OIL | Status: DC | PRN

## 2023-10-27 MED ORDER — ENOXAPARIN SODIUM 60 MG/0.6ML IJ SOSY
50.0000 mg | PREFILLED_SYRINGE | INTRAMUSCULAR | Status: DC
  Filled 2023-10-27 (×2): qty 0.6

## 2023-10-27 MED ORDER — HYDRALAZINE HCL 20 MG/ML IJ SOLN: 10.0000 mg | INTRAMUSCULAR | Status: DC | PRN

## 2023-10-27 MED ORDER — WITCH HAZEL-GLYCERIN EX PADS: 1.0000 | MEDICATED_PAD | CUTANEOUS | Status: DC | PRN

## 2023-10-27 MED ORDER — LACTATED RINGERS IV SOLN: INTRAVENOUS | Status: AC

## 2023-10-27 MED ORDER — OXYTOCIN-SODIUM CHLORIDE 30-0.9 UT/500ML-% IV SOLN
INTRAVENOUS | Status: DC | PRN
  Administered 2023-10-27: 300 mL via INTRAVENOUS

## 2023-10-27 MED ORDER — METOCLOPRAMIDE HCL 5 MG/ML IJ SOLN: 10.0000 mg | Freq: Four times a day (QID) | INTRAMUSCULAR | Status: DC | PRN

## 2023-10-27 MED ORDER — ZOLPIDEM TARTRATE 5 MG PO TABS: 5.0000 mg | ORAL_TABLET | Freq: Every evening | ORAL | Status: DC | PRN

## 2023-10-27 MED ORDER — INSULIN GLARGINE 100 UNIT/ML ~~LOC~~ SOLN
10.0000 [IU] | Freq: Two times a day (BID) | SUBCUTANEOUS | Status: DC
  Filled 2023-10-27: qty 0.1

## 2023-10-27 MED ORDER — INSULIN ASPART 100 UNIT/ML IJ SOLN
0.0000 [IU] | INTRAMUSCULAR | Status: DC
  Administered 2023-10-28: 3 [IU] via SUBCUTANEOUS
  Administered 2023-10-28 (×2): 2 [IU] via SUBCUTANEOUS
  Administered 2023-10-28 – 2023-10-29 (×2): 3 [IU] via SUBCUTANEOUS

## 2023-10-27 MED ORDER — INSULIN ASPART 100 UNIT/ML IJ SOLN: 4.0000 [IU] | Freq: Once | INTRAMUSCULAR | Status: DC

## 2023-10-27 MED ORDER — DIBUCAINE (PERIANAL) 1 % EX OINT: 1.0000 | TOPICAL_OINTMENT | CUTANEOUS | Status: DC | PRN

## 2023-10-27 MED ORDER — INSULIN REGULAR(HUMAN) IN NACL 100-0.9 UT/100ML-% IV SOLN
INTRAVENOUS | Status: DC
  Administered 2023-10-27: 4.2 [IU]/h via INTRAVENOUS
  Filled 2023-10-27: qty 100

## 2023-10-27 MED ORDER — NIFEDIPINE ER OSMOTIC RELEASE 30 MG PO TB24
30.0000 mg | ORAL_TABLET | Freq: Two times a day (BID) | ORAL | Status: DC
  Administered 2023-10-27 – 2023-10-28 (×2): 30 mg via ORAL
  Filled 2023-10-27 (×2): qty 1

## 2023-10-27 MED ORDER — SIMETHICONE 80 MG PO CHEW: 80.0000 mg | CHEWABLE_TABLET | ORAL | Status: DC | PRN

## 2023-10-27 MED ORDER — SODIUM CHLORIDE 0.9 % IV SOLN
INTRAVENOUS | Status: DC | PRN
  Administered 2023-10-27: 500 mg via INTRAVENOUS

## 2023-10-27 MED ORDER — FENTANYL CITRATE (PF) 100 MCG/2ML IJ SOLN
INTRAMUSCULAR | Status: AC
  Filled 2023-10-27: qty 2

## 2023-10-27 MED ORDER — INSULIN GLARGINE 100 UNIT/ML ~~LOC~~ SOLN
10.0000 [IU] | Freq: Every day | SUBCUTANEOUS | Status: DC
  Administered 2023-10-27 – 2023-10-29 (×3): 10 [IU] via SUBCUTANEOUS
  Filled 2023-10-27 (×4): qty 0.1

## 2023-10-27 MED ORDER — NIFEDIPINE 10 MG PO CAPS
ORAL_CAPSULE | ORAL | Status: AC
  Filled 2023-10-27: qty 1

## 2023-10-27 MED ORDER — SIMETHICONE 80 MG PO CHEW
80.0000 mg | CHEWABLE_TABLET | Freq: Three times a day (TID) | ORAL | Status: DC
  Administered 2023-10-27 – 2023-10-30 (×8): 80 mg via ORAL
  Filled 2023-10-27 (×8): qty 1

## 2023-10-27 MED ORDER — INSULIN ASPART 100 UNIT/ML IJ SOLN
0.0000 [IU] | INTRAMUSCULAR | Status: DC
Start: 2023-10-27 — End: 2023-10-27

## 2023-10-27 MED ORDER — TRANEXAMIC ACID-NACL 1000-0.7 MG/100ML-% IV SOLN
INTRAVENOUS | Status: AC
  Filled 2023-10-27: qty 100

## 2023-10-27 MED ORDER — LABETALOL HCL 5 MG/ML IV SOLN
INTRAVENOUS | Status: AC
  Administered 2023-10-27: 20 mg via INTRAVENOUS
  Filled 2023-10-27: qty 4

## 2023-10-27 MED ORDER — BASAGLAR KWIKPEN 100 UNIT/ML ~~LOC~~ SOPN: 10.0000 [IU] | PEN_INJECTOR | Freq: Two times a day (BID) | SUBCUTANEOUS | Status: DC

## 2023-10-27 MED ORDER — MEASLES, MUMPS & RUBELLA VAC IJ SOLR: 0.5000 mL | Freq: Once | INTRAMUSCULAR | Status: DC

## 2023-10-27 MED ORDER — POTASSIUM CHLORIDE 20 MEQ PO PACK: 20.0000 meq | PACK | Freq: Two times a day (BID) | ORAL | Status: DC

## 2023-10-27 MED ORDER — OXYCODONE HCL 5 MG PO TABS: 5.0000 mg | ORAL_TABLET | Freq: Once | ORAL | Status: DC | PRN

## 2023-10-27 MED ORDER — OXYCODONE HCL 5 MG/5ML PO SOLN: 5.0000 mg | Freq: Once | ORAL | Status: DC | PRN

## 2023-10-27 MED ORDER — LABETALOL HCL 5 MG/ML IV SOLN
40.0000 mg | INTRAVENOUS | Status: DC | PRN
  Filled 2023-10-27: qty 8

## 2023-10-27 MED ORDER — TETANUS-DIPHTH-ACELL PERTUSSIS 5-2.5-18.5 LF-MCG/0.5 IM SUSY: 0.5000 mL | PREFILLED_SYRINGE | Freq: Once | INTRAMUSCULAR | Status: DC

## 2023-10-27 MED ORDER — MAGNESIUM HYDROXIDE 400 MG/5ML PO SUSP: 30.0000 mL | ORAL | Status: DC | PRN

## 2023-10-27 MED ORDER — OXYTOCIN-SODIUM CHLORIDE 30-0.9 UT/500ML-% IV SOLN
INTRAVENOUS | Status: AC
  Filled 2023-10-27: qty 500

## 2023-10-27 MED ORDER — LIDOCAINE-EPINEPHRINE (PF) 2 %-1:200000 IJ SOLN
INTRAMUSCULAR | Status: DC | PRN
Start: 2023-10-27 — End: 2023-10-27
  Administered 2023-10-27: 10 mL via EPIDURAL

## 2023-10-27 MED ORDER — MAGNESIUM SULFATE 40 GM/1000ML IV SOLN
2.0000 g/h | INTRAVENOUS | Status: AC
  Administered 2023-10-27 – 2023-10-28 (×2): 2 g/h via INTRAVENOUS
  Filled 2023-10-27: qty 1000

## 2023-10-27 MED ORDER — POTASSIUM CHLORIDE CRYS ER 20 MEQ PO TBCR
20.0000 meq | EXTENDED_RELEASE_TABLET | Freq: Every day | ORAL | Status: DC
  Filled 2023-10-27: qty 1

## 2023-10-27 MED ORDER — MENTHOL 3 MG MT LOZG: 1.0000 | LOZENGE | OROMUCOSAL | Status: DC | PRN

## 2023-10-27 MED ORDER — SENNOSIDES-DOCUSATE SODIUM 8.6-50 MG PO TABS
2.0000 | ORAL_TABLET | Freq: Every day | ORAL | Status: DC
Start: 2023-10-28 — End: 2023-10-27

## 2023-10-27 MED ORDER — LABETALOL HCL 5 MG/ML IV SOLN: 80.0000 mg | INTRAVENOUS | Status: DC | PRN

## 2023-10-27 MED ORDER — POTASSIUM CHLORIDE 10 MEQ/100ML IV SOLN
10.0000 meq | Freq: Once | INTRAVENOUS | Status: AC
  Administered 2023-10-27: 10 meq via INTRAVENOUS
  Filled 2023-10-27: qty 100

## 2023-10-27 MED ORDER — FUROSEMIDE 10 MG/ML IJ SOLN
10.0000 mg | Freq: Every day | INTRAMUSCULAR | Status: AC
Start: 2023-10-27 — End: 2023-10-29
  Administered 2023-10-27 – 2023-10-28 (×2): 10 mg via INTRAVENOUS
  Filled 2023-10-27 (×2): qty 2

## 2023-10-27 MED ORDER — ACETAMINOPHEN 10 MG/ML IV SOLN
INTRAVENOUS | Status: DC | PRN
  Administered 2023-10-27: 1000 mg via INTRAVENOUS

## 2023-10-27 MED ORDER — DEXTROSE IN LACTATED RINGERS 5 % IV SOLN: INTRAVENOUS | Status: DC

## 2023-10-27 MED ORDER — GABAPENTIN 100 MG PO CAPS
200.0000 mg | ORAL_CAPSULE | Freq: Every day | ORAL | Status: DC
  Administered 2023-10-27 – 2023-10-30 (×3): 200 mg via ORAL
  Filled 2023-10-27 (×3): qty 2

## 2023-10-27 MED ORDER — POTASSIUM CHLORIDE CRYS ER 20 MEQ PO TBCR
40.0000 meq | EXTENDED_RELEASE_TABLET | Freq: Once | ORAL | Status: DC
  Filled 2023-10-27: qty 2

## 2023-10-27 MED ORDER — DEXAMETHASONE SODIUM PHOSPHATE 10 MG/ML IJ SOLN
INTRAMUSCULAR | Status: DC | PRN
  Administered 2023-10-27: 5 mg via INTRAVENOUS

## 2023-10-27 SURGICAL SUPPLY — 28 items
BENZOIN TINCTURE PRP APPL 2/3 (GAUZE/BANDAGES/DRESSINGS) IMPLANT
CHLORAPREP W/TINT 26 (MISCELLANEOUS) ×2 IMPLANT
CLAMP UMBILICAL CORD (MISCELLANEOUS) ×1 IMPLANT
CLOTH BEACON ORANGE TIMEOUT ST (SAFETY) ×1 IMPLANT
DRSG OPSITE POSTOP 4X10 (GAUZE/BANDAGES/DRESSINGS) ×1 IMPLANT
ELECT REM PT RETURN 9FT ADLT (ELECTROSURGICAL) ×1
ELECTRODE REM PT RTRN 9FT ADLT (ELECTROSURGICAL) ×1 IMPLANT
EXTRACTOR VACUUM KIWI (MISCELLANEOUS) IMPLANT
GAUZE SPONGE 4X4 12PLY STRL LF (GAUZE/BANDAGES/DRESSINGS) IMPLANT
GLOVE SURG ORTHO 8.0 STRL STRW (GLOVE) ×1 IMPLANT
GOWN STRL REUS W/TWL LRG LVL3 (GOWN DISPOSABLE) ×2 IMPLANT
KIT ABG SYR 3ML LUER SLIP (SYRINGE) IMPLANT
MAT PREVALON FULL STRYKER (MISCELLANEOUS) IMPLANT
NDL HYPO 25X5/8 SAFETYGLIDE (NEEDLE) IMPLANT
NEEDLE HYPO 25X5/8 SAFETYGLIDE (NEEDLE) IMPLANT
NS IRRIG 1000ML POUR BTL (IV SOLUTION) ×1 IMPLANT
PACK C SECTION WH (CUSTOM PROCEDURE TRAY) ×1 IMPLANT
PAD OB MATERNITY 4.3X12.25 (PERSONAL CARE ITEMS) ×1 IMPLANT
RTRCTR C-SECT PINK 25CM LRG (MISCELLANEOUS) IMPLANT
STRIP CLOSURE SKIN 1/2X4 (GAUZE/BANDAGES/DRESSINGS) IMPLANT
SUT MON AB-0 CT1 36 (SUTURE) ×2 IMPLANT
SUT PLAIN 0 NONE (SUTURE) IMPLANT
SUT VIC AB 0 CT1 27XBRD ANBCTR (SUTURE) ×2 IMPLANT
SUT VIC AB 2-0 CT1 TAPERPNT 27 (SUTURE) ×1 IMPLANT
SUT VIC AB 4-0 SH 27XANBCTRL (SUTURE) ×1 IMPLANT
TOWEL OR 17X24 6PK STRL BLUE (TOWEL DISPOSABLE) ×1 IMPLANT
TRAY FOLEY W/BAG SLVR 14FR LF (SET/KITS/TRAYS/PACK) ×1 IMPLANT
WATER STERILE IRR 1000ML POUR (IV SOLUTION) ×1 IMPLANT

## 2023-10-27 NOTE — Progress Notes (Signed)
 OB Note Patient having continued lates and appeared worse so I went to talk to her again and I told her that I'm not sure why baby started having these lates after she SROM'ed. I asked her if I could check her and place an FSE and she was okay with that. She was unchanged at 2-3/70/-1 and FSE placed; terbutaline just given.   I told her I recommend proceeding with a c-section due to fetal intolerance of labor remote from delivery and that the dips in the heart rate are a sign of fetal distress. Patient stated that she wanted to wait and see if the interventions help with the HR. I told her that even if it does help that in order for her to delivery vaginally we will need to cause contractions again which will likely cause a repeat of the heart rate drops, as opposed to proceeding now when the baby is not as stressed, due to the interventions.  She stated she still wanted to wait and see how the HR does; I told her that while we were talking that the baby was still having dips in the HR but not as deep due to the interventions.   D/w anesthesia and will give 40 PO of potassium. Will re-check CBG 45m from the terb and give short acting PRN

## 2023-10-27 NOTE — Transfer of Care (Signed)
 Immediate Anesthesia Transfer of Care Note  Patient: Misty [name]  Procedure(s) Performed: CESAREAN SECTION  Patient Location: PACU  Anesthesia Type:Epidural  Level of Consciousness: awake  Airway & Oxygen Therapy: Patient Spontanous Breathing  Post-op Assessment: Report given to RN and Post -op Vital signs reviewed and stable  Post vital signs: Reviewed and stable  Last Vitals:  Vitals Value Taken Time  BP 122/56 10/27/23 1430  Temp    Pulse 91 10/27/23 1433  Resp 17 10/27/23 1433  SpO2 98 % 10/27/23 1433  Vitals shown include unfiled device data.  Last Pain:  Vitals:   10/27/23 1230  TempSrc:   PainSc: 0-No pain         Complications: No notable events documented.

## 2023-10-27 NOTE — Inpatient Diabetes Management (Signed)
 ADA Standards of Care 2025 Diabetes in Pregnancy Target Glucose Ranges:  Fasting: 70 - 95 mg/dL 1 hr postprandial:  161 - 140mg /dL (from first bite of meal) 2 hr postprandial:  100 - 120 mg/dL (from first bit of meal)   Note referral.  History of Type 2 DM (prior to pregnancy).  Currently patient is on IV insulin.   After delivery, consider Novolog 0-9 units q 4 hours.  Will follow up on 2/24.   Thanks  Lorenza Cambridge, RN, BC-ADM Inpatient Diabetes Coordinator Pager [phone]  (8a-5p) *DM Coordinator working remotely.  Please page for any immediate weekend needs 8a-5p.

## 2023-10-27 NOTE — Progress Notes (Addendum)
 OB Note Patient also wanting to come off Mg b/c she states that it is making her feel shaky; UOP approximately 75-132mL. CTAB, normal s1 and s2, no MRGs, 1+ brachial. I d/w her rationale for Mg and prevention of seizures which are harmful to mom and baby. I told her we will get a Mg level to ensure it's in the proper range. Patient still desires to come off it. I told her if she wants to then we will stop it and for her to let us know if we can restart it. I also told her I still recommend a c-section. I told her the decels are better but are still happening with her contractions further spaced out, currently approx q67m, and less intense so we can not offer her augmentation assistance. I d/w her her, again, rationale for c/s as well as how the decels are a sign of fetal distress. Patient still declines c/s. I told her that if she elects for c/s to let the nurse know. If decels go away for an extended period of time, can consider offering pitocin again, after cervical exam not showing any change.  Repeat CBG in the 150s so will give 4u aspart  Cornelia Copa MD Attending Center for [facility] (Faculty Practice) 10/27/2023 Time: [phone]

## 2023-10-27 NOTE — Progress Notes (Signed)
 Progress note   Pt seen and chart has been reviewed.  Pt has declared she would prefer primary cesarean section at this time.  Dr. Vergie Living had spoken to the patient previously regarding the fetal tracing.  The fetal tracing has had minimal variability and occasionally late decelerations.  Pt currently has severe preeclampsia, and her magnesium sulfate actually was never turned off.    Pt has decided on operative delivery  to expedite her delivery.  The risks of surgery were discussed with the patient including but were not limited to: bleeding which may require transfusion or reoperation; infection which may require antibiotics; injury to bowel, bladder, ureters or other surrounding organs; injury to the fetus; need for additional procedures including hysterectomy in the event of a life-threatening hemorrhage; formation of adhesions; placental abnormalities wth subsequent pregnancies; incisional problems; thromboembolic phenomenon and other postoperative/anesthesia complications.  The patient concurred with the proposed plan, giving informed written consent for the procedure.   Patient has been NPO since admission and  she will remain NPO for procedure. Anesthesia and OR aware. Preoperative prophylactic antibiotics and SCDs ordered on call to the OR.  To OR when ready.  Mariel Aloe, MD

## 2023-10-27 NOTE — Plan of Care (Signed)
 Problem: Education: Goal: Knowledge of disease or condition will improve Outcome: Progressing Goal: Knowledge of the prescribed therapeutic regimen will improve Outcome: Progressing   Problem: Fluid Volume: Goal: Peripheral tissue perfusion will improve Outcome: Progressing   Problem: Clinical Measurements: Goal: Complications related to disease process, condition or treatment will be avoided or minimized Outcome: Progressing   Problem: Education: Goal: Knowledge of General Education information will improve Description: Including pain rating scale, medication(s)/side effects and non-pharmacologic comfort measures Outcome: Progressing   Problem: Health Behavior/Discharge Planning: Goal: Ability to manage health-related needs will improve Outcome: Progressing   Problem: Clinical Measurements: Goal: Ability to maintain clinical measurements within normal limits will improve Outcome: Progressing Goal: Will remain free from infection Outcome: Progressing Goal: Diagnostic test results will improve Outcome: Progressing Goal: Respiratory complications will improve Outcome: Progressing Goal: Cardiovascular complication will be avoided Outcome: Progressing   Problem: Activity: Goal: Risk for activity intolerance will decrease Outcome: Progressing   Problem: Nutrition: Goal: Adequate nutrition will be maintained Outcome: Progressing   Problem: Coping: Goal: Level of anxiety will decrease Outcome: Progressing   Problem: Elimination: Goal: Will not experience complications related to bowel motility Outcome: Progressing Goal: Will not experience complications related to urinary retention Outcome: Progressing   Problem: Pain Managment: Goal: General experience of comfort will improve and/or be controlled Outcome: Progressing   Problem: Safety: Goal: Ability to remain free from injury will improve Outcome: Progressing   Problem: Skin Integrity: Goal: Risk for impaired  skin integrity will decrease Outcome: Progressing   Problem: Education: Goal: Knowledge of Childbirth will improve Outcome: Progressing Goal: Ability to make informed decisions regarding treatment and plan of care will improve Outcome: Progressing Goal: Ability to state and carry out methods to decrease the pain will improve Outcome: Progressing Goal: Individualized Educational Video(s) Outcome: Progressing   Problem: Coping: Goal: Ability to verbalize concerns and feelings about labor and delivery will improve Outcome: Progressing   Problem: Life Cycle: Goal: Ability to make normal progression through stages of labor will improve Outcome: Progressing Goal: Ability to effectively push during vaginal delivery will improve Outcome: Progressing   Problem: Role Relationship: Goal: Will demonstrate positive interactions with the child Outcome: Progressing   Problem: Safety: Goal: Risk of complications during labor and delivery will decrease Outcome: Progressing   Problem: Pain Management: Goal: Relief or control of pain from uterine contractions will improve Outcome: Progressing   Problem: Education: Goal: Ability to describe self-care measures that may prevent or decrease complications (Diabetes Survival Skills Education) will improve Outcome: Progressing Goal: Individualized Educational Video(s) Outcome: Progressing   Problem: Coping: Goal: Ability to adjust to condition or change in health will improve Outcome: Progressing   Problem: Fluid Volume: Goal: Ability to maintain a balanced intake and output will improve Outcome: Progressing   Problem: Health Behavior/Discharge Planning: Goal: Ability to identify and utilize available resources and services will improve Outcome: Progressing Goal: Ability to manage health-related needs will improve Outcome: Progressing   Problem: Metabolic: Goal: Ability to maintain appropriate glucose levels will improve Outcome:  Progressing   Problem: Nutritional: Goal: Maintenance of adequate nutrition will improve Outcome: Progressing Goal: Progress toward achieving an optimal weight will improve Outcome: Progressing   Problem: Skin Integrity: Goal: Risk for impaired skin integrity will decrease Outcome: Progressing   Problem: Tissue Perfusion: Goal: Adequacy of tissue perfusion will improve Outcome: Progressing   Problem: Education: Goal: Knowledge of the prescribed therapeutic regimen will improve Outcome: Progressing Goal: Understanding of sexual limitations or changes related to disease process or condition  will improve Outcome: Progressing Goal: Individualized Educational Video(s) Outcome: Progressing   Problem: Self-Concept: Goal: Communication of feelings regarding changes in body function or appearance will improve Outcome: Progressing   Problem: Skin Integrity: Goal: Demonstration of wound healing without infection will improve Outcome: Progressing

## 2023-10-27 NOTE — Lactation Note (Signed)
 This note was copied from a baby's chart. Lactation Consultation Note  Patient Name: Misty [name] [id number] Date: 10/27/2023 Age:33 hours Reason for consult: Initial assessment;Early term 37-38.6wks;Infant < 6lbs  P2, 37 wks, @ 4 hrs of life. Mom formula fed with first feed. Discussed setting up pump for breast stimulation. Moms first baby was a NICU baby- aware of pump use- verbalized understanding of pumping every 3 hours to get milk supply started. Mom receptive to starting a cycle- demonstrated supplies, moved down to 21mm flanges. Moms room very busy- RN, Peds MD, Lab for mom, then baby- halfway through cycle mom requests we finish pumping later. LC services, and milk storage shared.     Feeding Mother's Current Feeding Choice: Breast Milk and Formula Nipple Type: Slow - flow   Lactation Tools Discussed/Used Tools: Pump;Flanges Flange Size: 21 Breast pump type: Double-Electric Breast Pump Pump Education: Setup, frequency, and cleaning;Milk Storage Reason for Pumping: Breast stimulation, Less then 6 lb baby, Baby supplemented first feed Pumping frequency: Every 3 hours  Interventions Interventions: DEBP;Education;LC Services brochure (Milk Storage Guidelines)  Discharge    Consult Status Consult Status: Follow-up Date: 10/28/23 Follow-up type: In-patient    [facility] 10/27/2023, 5:43 PM

## 2023-10-27 NOTE — Anesthesia Postprocedure Evaluation (Signed)
 Anesthesia Post Note  Patient: [name]  Procedure(s) Performed: CESAREAN SECTION     Patient location during evaluation: PACU Anesthesia Type: Epidural Level of consciousness: awake and alert Pain management: pain level controlled Vital Signs Assessment: post-procedure vital signs reviewed and stable Respiratory status: spontaneous breathing, nonlabored ventilation and respiratory function stable Cardiovascular status: blood pressure returned to baseline and stable Postop Assessment: no apparent nausea or vomiting Anesthetic complications: no   No notable events documented.  Last Vitals:  Vitals:   10/27/23 1530 10/27/23 1545  BP: (!) 149/84 (!) 159/83  Pulse: 77 72  Resp: (!) 21 (!) 21  Temp:    SpO2: 99% 97%    Last Pain:  Vitals:   10/27/23 1545  TempSrc:   PainSc: 0-No pain   Pain Goal:    LLE Motor Response: Purposeful movement (10/27/23 1545) LLE Sensation: Tingling (10/27/23 1545) RLE Motor Response: Purposeful movement (10/27/23 1545) RLE Sensation: Tingling (10/27/23 1545)        Lowella Curb

## 2023-10-27 NOTE — Progress Notes (Signed)
 L&D Note  10/27/2023 - 1:52 AM  33 y.o. [id number] [redacted]w[redacted]d. Pregnancy complicated by:  Patient Active Problem List   Diagnosis Date Noted   Chronic hypertension affecting pregnancy 10/26/2023   Obesity affecting pregnancy, antepartum 06/13/2023   History of preterm delivery 05/21/2023   Supervision of high risk pregnancy, antepartum 05/01/2023   Trichomoniasis 04/15/2023   Diabetes mellitus affecting pregnancy in first trimester 04/15/2023   Nausea and vomiting in pregnancy 04/15/2023   Subchorionic hemorrhage in first trimester 04/15/2023   Type 2 diabetes mellitus (HCC) 01/08/2023   Chronic hypertension 06/17/2021   Anemia in pregnancy 05/30/2021   Marijuana use 05/24/2021   [name] is admitted for severe pre-eclampsia superimposed on poorly controlled CHTN.    Subjective:  More comfortable but still able to feel UCs Thinks her water just broke Objective:    Current Vital Signs 24h Vital Sign Ranges  T 98.2 F (36.8 C) Temp  Avg: 98.3 F (36.8 C)  Min: 98.1 F (36.7 C)  Max: 98.4 F (36.9 C)  BP 125/70 BP  Min: 105/60  Max: 188/105  HR 82 Pulse  Avg: 88.1  Min: 70  Max: 106  RR 16 Resp  Avg: 15.5  Min: 12  Max: 18  SaO2 97 % Room Air SpO2  Avg: 99 %  Min: 97 %  Max: 100 %       24 Hour I/O Current Shift I/O  Time Ins Outs 02/22 0701 - 02/23 0700 In: -  Out: 450 [Urine:450] 02/22 1901 - 02/23 0700 In: -  Out: 300 [Urine:300]   FHR: category I with tracing Toco: q5-35m  Gen: comfortable CTAB Normal s1 and s2, no mrgs 2+ brachial SVE: 2-3/70/-1 for leading edge of head, BOW not felt, SROM grossly ruptured (clear)  Labs:  Recent Labs  Lab 10/26/23 1535 10/26/23 2216  WBC 6.8 7.2  HGB 11.4* 11.1*  HCT 33.3* 32.5*  PLT 319 314   Recent Labs  Lab 10/26/23 1535 10/26/23 2216  NA 136 132*  K 3.3* 3.3*  CL 103 100  CO2 22 21*  BUN <5* <5*  CREATININE 0.75 0.74  CALCIUM 8.5* 7.9*  PROT 6.7 6.4*  BILITOT 0.9 1.1  ALKPHOS 109 107  ALT 15 14   AST 22 19  GLUCOSE 111* 141*    Medications Current Facility-Administered Medications  Medication Dose Route Frequency Provider Last Rate Last Admin   acetaminophen (TYLENOL) tablet 650 mg  650 mg Oral Q4H PRN Leftwich-Kirby, Lisa A, CNM       diphenhydrAMINE (BENADRYL) injection 12.5 mg  12.5 mg Intravenous Q15 min PRN Beryle Lathe, MD       ePHEDrine injection 10 mg  10 mg Intravenous PRN Beryle Lathe, MD       ePHEDrine injection 10 mg  10 mg Intravenous PRN Beryle Lathe, MD       fentaNYL (SUBLIMAZE) injection 50-100 mcg  50-100 mcg Intravenous Q1H PRN Hessie Dibble, MD   50 mcg at 10/26/23 2206   fentaNYL 2 mcg/mL w/ bupivacaine 0.125% in NS 250 mL epidural infusion  12 mL/hr Epidural Continuous PRN Beryle Lathe, MD 12 mL/hr at 10/26/23 2348 12 mL/hr at 10/26/23 2348   NIFEdipine (PROCARDIA) capsule 10 mg  10 mg Oral PRN Leftwich-Kirby, Misty Stanley A, CNM   10 mg at 10/26/23 1614   And   NIFEdipine (PROCARDIA) capsule 20 mg  20 mg Oral PRN Leftwich-Kirby, Wilmer Floor, CNM  And   NIFEdipine (PROCARDIA) capsule 20 mg  20 mg Oral PRN Leftwich-Kirby, Lisa A, CNM   20 mg at 10/26/23 1807   And   labetalol (NORMODYNE) injection 40 mg  40 mg Intravenous PRN Leftwich-Kirby, Lisa A, CNM       labetalol (NORMODYNE) tablet 100 mg  100 mg Oral BID Wyn Forster, MD   100 mg at 10/26/23 1729   lactated ringers infusion 500-1,000 mL  500-1,000 mL Intravenous PRN Leftwich-Kirby, Lisa A, CNM       lactated ringers infusion   Intravenous Continuous Leftwich-Kirby, Wilmer Floor, CNM   Stopped at 10/26/23 1729   lactated ringers infusion   Intravenous Continuous Wyn Forster, MD 10 mL/hr at 10/26/23 2351 New Bag at 10/26/23 2351   lidocaine (PF) (XYLOCAINE) 1 % injection 30 mL  30 mL Subcutaneous PRN Leftwich-Kirby, Lisa A, CNM       magnesium sulfate 40 grams in SWI 1000 mL OB infusion  2 g/hr Intravenous Titrated Wyn Forster, MD 50 mL/hr at 10/26/23 1657 2 g/hr at 10/26/23 1657   NIFEdipine  (PROCARDIA-XL/NIFEDICAL-XL) 24 hr tablet 30 mg  30 mg Oral BID Wyn Forster, MD   30 mg at 10/26/23 1728   ondansetron (ZOFRAN) injection 4 mg  4 mg Intravenous Q6H PRN Leftwich-Kirby, Lisa A, CNM   4 mg at 10/26/23 1707   oxytocin (PITOCIN) IV BOLUS FROM BAG  333 mL Intravenous Once Leftwich-Kirby, Lisa A, CNM       oxytocin (PITOCIN) IV infusion 30 units in NS 500 mL - Premix  2.5 Units/hr Intravenous Continuous Leftwich-Kirby, Lisa A, CNM       oxytocin (PITOCIN) IV infusion 30 units in NS 500 mL - Premix  1-40 milli-units/min Intravenous Titrated Vermillion Bing, MD       penicillin G potassium 5 Million Units in sodium chloride 0.9 % 250 mL IVPB  5 Million Units Intravenous Once Jarales Bing, MD       Followed by   penicillin G potassium 3 Million Units in dextrose 50mL IVPB  3 Million Units Intravenous Q4H Ogle Bing, MD       PHENYLephrine 80 mcg/ml in normal saline Adult IV Push Syringe (For Blood Pressure Support)  80-160 mcg Intravenous PRN Beryle Lathe, MD       PHENYLephrine 80 mcg/ml in normal saline Adult IV Push Syringe (For Blood Pressure Support)  80 mcg Intravenous PRN Beryle Lathe, MD   80 mcg at 10/27/23 0015   potassium chloride SA (KLOR-CON M) CR tablet 20 mEq  20 mEq Oral Daily Darfur Bing, MD       sodium citrate-citric acid (ORACIT) solution 30 mL  30 mL Oral Q2H PRN Leftwich-Kirby, Lisa A, CNM       terbutaline (BRETHINE) injection 0.25 mg  0.25 mg Subcutaneous Once PRN Wyn Forster, MD       terbutaline (BRETHINE) injection 0.25 mg  0.25 mg Subcutaneous Once PRN [place] Bing, MD       Facility-Administered Medications Ordered in Other Encounters  Medication Dose Route Frequency Provider Last Rate Last Admin   lidocaine (PF) (XYLOCAINE) 1 % injection   Epidural Anesthesia Intra-op Beryle Lathe, MD   5 mL at 10/26/23 2345    Assessment & Plan:  Patient stable *Pregnancy: fetal status reassuring *Severe pre-eclampsia with CHTN: doing  well on home regimen. Continue Mg. Rpt labs at 5a/5p *DM2: continue q4h CBGs and q2h when active; normal CBGs thus far *IOL: pt amenable to starting low  dose pitocin -s/p cytotec x2 at 1630; SROM 2/23 @ 0145 *GBS: continue PCN *Analgesia: epidural working well   Cornelia Copa. MD Attending Center for [facility])

## 2023-10-27 NOTE — Progress Notes (Signed)
 Pt intolerant of oral potassium replacement due to vomiting IV potassium ordered. K 3.2  Wyn Forster, MD FMOB Fellow, Faculty practice [facility], Center for [facility]

## 2023-10-27 NOTE — Progress Notes (Signed)
 OB Note I spoke to the patient re: plan of care. Baby is no longer having decles but she is no longer contracting but baby is flat with minimal variability which is still a sign of fetal distress. I told her that I still recommend a c/s. Patient still declining at this time. I told her that we cannot augment with pitocin at this time given the baby's EFM.  Cornelia Copa MD Attending Center for [employer] (Faculty Practice) 10/27/2023 Time: [phone]

## 2023-10-27 NOTE — Plan of Care (Signed)
 Problem: Education: Goal: Knowledge of disease or condition will improve 10/27/2023 2000 by Samuella Cota, RN Outcome: Progressing 10/27/2023 1959 by Samuella Cota, RN Outcome: Progressing Goal: Knowledge of the prescribed therapeutic regimen will improve 10/27/2023 2000 by Samuella Cota, RN Outcome: Progressing 10/27/2023 1959 by Samuella Cota, RN Outcome: Progressing   Problem: Fluid Volume: Goal: Peripheral tissue perfusion will improve 10/27/2023 2000 by Samuella Cota, RN Outcome: Progressing 10/27/2023 1959 by Samuella Cota, RN Outcome: Progressing   Problem: Clinical Measurements: Goal: Complications related to disease process, condition or treatment will be avoided or minimized 10/27/2023 2000 by Samuella Cota, RN Outcome: Progressing 10/27/2023 1959 by Samuella Cota, RN Outcome: Progressing   Problem: Education: Goal: Knowledge of General Education information will improve Description: Including pain rating scale, medication(s)/side effects and non-pharmacologic comfort measures 10/27/2023 2000 by Samuella Cota, RN Outcome: Progressing 10/27/2023 1959 by Samuella Cota, RN Outcome: Progressing   Problem: Health Behavior/Discharge Planning: Goal: Ability to manage health-related needs will improve 10/27/2023 2000 by Samuella Cota, RN Outcome: Progressing 10/27/2023 1959 by Samuella Cota, RN Outcome: Progressing   Problem: Clinical Measurements: Goal: Ability to maintain clinical measurements within normal limits will improve 10/27/2023 2000 by Samuella Cota, RN Outcome: Progressing 10/27/2023 1959 by Samuella Cota, RN Outcome: Progressing Goal: Will remain free from infection 10/27/2023 2000 by Samuella Cota, RN Outcome: Progressing 10/27/2023 1959 by Samuella Cota, RN Outcome: Progressing Goal: Diagnostic test results will improve 10/27/2023 2000 by Samuella Cota, RN Outcome: Progressing 10/27/2023 1959 by Samuella Cota, RN Outcome: Progressing Goal: Respiratory  complications will improve 10/27/2023 2000 by Samuella Cota, RN Outcome: Progressing 10/27/2023 1959 by Samuella Cota, RN Outcome: Progressing Goal: Cardiovascular complication will be avoided 10/27/2023 2000 by Samuella Cota, RN Outcome: Progressing 10/27/2023 1959 by Samuella Cota, RN Outcome: Progressing   Problem: Activity: Goal: Risk for activity intolerance will decrease 10/27/2023 2000 by Samuella Cota, RN Outcome: Progressing 10/27/2023 1959 by Samuella Cota, RN Outcome: Progressing   Problem: Nutrition: Goal: Adequate nutrition will be maintained 10/27/2023 2000 by Samuella Cota, RN Outcome: Progressing 10/27/2023 1959 by Samuella Cota, RN Outcome: Progressing   Problem: Coping: Goal: Level of anxiety will decrease 10/27/2023 2000 by Samuella Cota, RN Outcome: Progressing 10/27/2023 1959 by Samuella Cota, RN Outcome: Progressing   Problem: Elimination: Goal: Will not experience complications related to bowel motility 10/27/2023 2000 by Samuella Cota, RN Outcome: Progressing 10/27/2023 1959 by Samuella Cota, RN Outcome: Progressing Goal: Will not experience complications related to urinary retention 10/27/2023 2000 by Samuella Cota, RN Outcome: Progressing 10/27/2023 1959 by Samuella Cota, RN Outcome: Progressing   Problem: Pain Managment: Goal: General experience of comfort will improve and/or be controlled 10/27/2023 2000 by Samuella Cota, RN Outcome: Progressing 10/27/2023 1959 by Samuella Cota, RN Outcome: Progressing   Problem: Safety: Goal: Ability to remain free from injury will improve 10/27/2023 2000 by Samuella Cota, RN Outcome: Progressing 10/27/2023 1959 by Samuella Cota, RN Outcome: Progressing   Problem: Skin Integrity: Goal: Risk for impaired skin integrity will decrease 10/27/2023 2000 by Samuella Cota, RN Outcome: Progressing 10/27/2023 1959 by Samuella Cota, RN Outcome: Progressing   Problem: Education: Goal: Knowledge of Childbirth will  improve 10/27/2023 2000 by Samuella Cota, RN Outcome: Progressing 10/27/2023 1959 by Samuella Cota, RN Outcome: Progressing Goal: Ability to make informed decisions regarding treatment and  plan of care will improve 10/27/2023 2000 by Samuella Cota, RN Outcome: Progressing 10/27/2023 1959 by Samuella Cota, RN Outcome: Progressing Goal: Ability to state and carry out methods to decrease the pain will improve 10/27/2023 2000 by Samuella Cota, RN Outcome: Progressing 10/27/2023 1959 by Samuella Cota, RN Outcome: Progressing Goal: Individualized Educational Video(s) 10/27/2023 2000 by Samuella Cota, RN Outcome: Progressing 10/27/2023 1959 by Samuella Cota, RN Outcome: Progressing   Problem: Coping: Goal: Ability to verbalize concerns and feelings about labor and delivery will improve 10/27/2023 2000 by Samuella Cota, RN Outcome: Progressing 10/27/2023 1959 by Samuella Cota, RN Outcome: Progressing   Problem: Life Cycle: Goal: Ability to make normal progression through stages of labor will improve 10/27/2023 2000 by Samuella Cota, RN Outcome: Progressing 10/27/2023 1959 by Samuella Cota, RN Outcome: Progressing Goal: Ability to effectively push during vaginal delivery will improve 10/27/2023 2000 by Samuella Cota, RN Outcome: Progressing 10/27/2023 1959 by Samuella Cota, RN Outcome: Progressing   Problem: Role Relationship: Goal: Will demonstrate positive interactions with the child 10/27/2023 2000 by Samuella Cota, RN Outcome: Progressing 10/27/2023 1959 by Samuella Cota, RN Outcome: Progressing   Problem: Safety: Goal: Risk of complications during labor and delivery will decrease 10/27/2023 2000 by Samuella Cota, RN Outcome: Progressing 10/27/2023 1959 by Samuella Cota, RN Outcome: Progressing   Problem: Pain Management: Goal: Relief or control of pain from uterine contractions will improve 10/27/2023 2000 by Samuella Cota, RN Outcome: Progressing 10/27/2023 1959 by Samuella Cota,  RN Outcome: Progressing   Problem: Education: Goal: Ability to describe self-care measures that may prevent or decrease complications (Diabetes Survival Skills Education) will improve 10/27/2023 2000 by Samuella Cota, RN Outcome: Progressing 10/27/2023 1959 by Samuella Cota, RN Outcome: Progressing Goal: Individualized Educational Video(s) 10/27/2023 2000 by Samuella Cota, RN Outcome: Progressing 10/27/2023 1959 by Samuella Cota, RN Outcome: Progressing   Problem: Coping: Goal: Ability to adjust to condition or change in health will improve 10/27/2023 2000 by Samuella Cota, RN Outcome: Progressing 10/27/2023 1959 by Samuella Cota, RN Outcome: Progressing   Problem: Fluid Volume: Goal: Ability to maintain a balanced intake and output will improve 10/27/2023 2000 by Samuella Cota, RN Outcome: Progressing 10/27/2023 1959 by Samuella Cota, RN Outcome: Progressing   Problem: Health Behavior/Discharge Planning: Goal: Ability to identify and utilize available resources and services will improve 10/27/2023 2000 by Samuella Cota, RN Outcome: Progressing 10/27/2023 1959 by Samuella Cota, RN Outcome: Progressing Goal: Ability to manage health-related needs will improve 10/27/2023 2000 by Samuella Cota, RN Outcome: Progressing 10/27/2023 1959 by Samuella Cota, RN Outcome: Progressing   Problem: Metabolic: Goal: Ability to maintain appropriate glucose levels will improve 10/27/2023 2000 by Samuella Cota, RN Outcome: Progressing 10/27/2023 1959 by Samuella Cota, RN Outcome: Progressing   Problem: Nutritional: Goal: Maintenance of adequate nutrition will improve 10/27/2023 2000 by Samuella Cota, RN Outcome: Progressing 10/27/2023 1959 by Samuella Cota, RN Outcome: Progressing Goal: Progress toward achieving an optimal weight will improve 10/27/2023 2000 by Samuella Cota, RN Outcome: Progressing 10/27/2023 1959 by Samuella Cota, RN Outcome: Progressing   Problem: Skin Integrity: Goal: Risk  for impaired skin integrity will decrease 10/27/2023 2000 by Samuella Cota, RN Outcome: Progressing 10/27/2023 1959 by Samuella Cota, RN Outcome: Progressing   Problem: Tissue Perfusion: Goal: Adequacy of tissue perfusion will improve 10/27/2023 2000 by [place],  Stan Head, RN Outcome: Progressing 10/27/2023 1959 by Samuella Cota, RN Outcome: Progressing   Problem: Education: Goal: Knowledge of the prescribed therapeutic regimen will improve 10/27/2023 2000 by Samuella Cota, RN Outcome: Progressing 10/27/2023 1959 by Samuella Cota, RN Outcome: Progressing Goal: Understanding of sexual limitations or changes related to disease process or condition will improve 10/27/2023 2000 by Samuella Cota, RN Outcome: Progressing 10/27/2023 1959 by Samuella Cota, RN Outcome: Progressing Goal: Individualized Educational Video(s) 10/27/2023 2000 by Samuella Cota, RN Outcome: Progressing 10/27/2023 1959 by Samuella Cota, RN Outcome: Progressing   Problem: Self-Concept: Goal: Communication of feelings regarding changes in body function or appearance will improve 10/27/2023 2000 by Samuella Cota, RN Outcome: Progressing 10/27/2023 1959 by Samuella Cota, RN Outcome: Progressing   Problem: Skin Integrity: Goal: Demonstration of wound healing without infection will improve 10/27/2023 2000 by Samuella Cota, RN Outcome: Progressing 10/27/2023 1959 by Samuella Cota, RN Outcome: Progressing

## 2023-10-27 NOTE — Progress Notes (Addendum)
 OB Note Patient having recurrent late decels with minimal variability and contractions q53m; pitocin hasn't been started yet. Recommend IVF bolus and CBG 232 so I had her repeat the CBG and it was 155; I told her to start endotool and will see if that helps and hold off on terb given hyperglycemia risk. Patient still having recurrent lates with minimal variability and endotool not started yet so I told her to give her a dose a terbutaline and watch her carefully; RN stated she couldn't start endotool b/c no recent A1c but I told her this is incorrect and not needed before starting endotool and to talk to the charge nurse. Epidural in place and BP 100/60s which is low for the patient. I instructed the nurse to give her phenylephrine per order set. IV team also in the room to place another IV   Cornelia Copa MD Attending Center for [employer] (Faculty Practice) 10/27/2023 Time: 0400

## 2023-10-28 ENCOUNTER — Encounter (HOSPITAL_COMMUNITY): Payer: Self-pay | Admitting: Obstetrics and Gynecology

## 2023-10-28 DIAGNOSIS — Z98891 History of uterine scar from previous surgery: Secondary | ICD-10-CM

## 2023-10-28 DIAGNOSIS — A530 Latent syphilis, unspecified as early or late: Secondary | ICD-10-CM | POA: Diagnosis not present

## 2023-10-28 DIAGNOSIS — D649 Anemia, unspecified: Secondary | ICD-10-CM | POA: Diagnosis not present

## 2023-10-28 LAB — COMPREHENSIVE METABOLIC PANEL
ALT: 13 U/L (ref 0–44)
AST: 22 U/L (ref 15–41)
Albumin: 1.8 g/dL — ABNORMAL LOW (ref 3.5–5.0)
Alkaline Phosphatase: 96 U/L (ref 38–126)
Anion gap: 7 (ref 5–15)
BUN: <5 mg/dL — ABNORMAL LOW (ref 6–20)
CO2: 22 mmol/L (ref 22–32)
Calcium: 7.3 mg/dL — ABNORMAL LOW (ref 8.9–10.3)
Chloride: 104 mmol/L (ref 98–111)
Creatinine, Ser: 0.99 mg/dL (ref 0.44–1.00)
GFR, Estimated: >60 mL/min (ref >60)
Glucose, Bld: 155 mg/dL — ABNORMAL HIGH (ref 70–99)
Potassium: 3.5 mmol/L (ref 3.5–5.1)
Sodium: 133 mmol/L — ABNORMAL LOW (ref 135–145)
Total Bilirubin: 0.3 mg/dL (ref 0.0–1.2)
Total Protein: 5.5 g/dL — ABNORMAL LOW (ref 6.5–8.1)

## 2023-10-28 LAB — URINALYSIS, ROUTINE W REFLEX MICROSCOPIC
Bilirubin Urine: NEGATIVE
Glucose, UA: NEGATIVE mg/dL
Ketones, ur: NEGATIVE mg/dL
Nitrite: NEGATIVE
Protein, ur: NEGATIVE mg/dL
Specific Gravity, Urine: 1.004 — ABNORMAL LOW (ref 1.005–1.030)
pH: 5 (ref 5.0–8.0)

## 2023-10-28 LAB — CBC
HCT: 28.3 % — ABNORMAL LOW (ref 36.0–46.0)
Hemoglobin: 9.8 g/dL — ABNORMAL LOW (ref 12.0–15.0)
MCH: 31.9 pg (ref 26.0–34.0)
MCHC: 34.6 g/dL (ref 30.0–36.0)
MCV: 92.2 fL (ref 80.0–100.0)
Platelets: 305 10*3/uL (ref 150–400)
RBC: 3.07 MIL/uL — ABNORMAL LOW (ref 3.87–5.11)
RDW: 13.6 % (ref 11.5–15.5)
WBC: 17.2 10*3/uL — ABNORMAL HIGH (ref 4.0–10.5)
nRBC: 0 % (ref 0.0–0.2)

## 2023-10-28 LAB — T.PALLIDUM AB, TOTAL: T Pallidum Abs: REACTIVE — AB

## 2023-10-28 LAB — MAGNESIUM: Magnesium: 5.9 mg/dL — ABNORMAL HIGH (ref 1.7–2.4)

## 2023-10-28 LAB — GLUCOSE, CAPILLARY
Glucose-Capillary: 184 mg/dL — ABNORMAL HIGH (ref 70–99)
Glucose-Capillary: 143 mg/dL — ABNORMAL HIGH (ref 70–99)
Glucose-Capillary: 118 mg/dL — ABNORMAL HIGH (ref 70–99)
Glucose-Capillary: 142 mg/dL — ABNORMAL HIGH (ref 70–99)
Glucose-Capillary: 172 mg/dL — ABNORMAL HIGH (ref 70–99)

## 2023-10-28 MED ORDER — POTASSIUM CHLORIDE CRYS ER 20 MEQ PO TBCR
20.0000 meq | EXTENDED_RELEASE_TABLET | Freq: Every day | ORAL | Status: DC
  Administered 2023-10-28 – 2023-10-30 (×3): 20 meq via ORAL
  Filled 2023-10-28 (×3): qty 1

## 2023-10-28 MED ORDER — FERROUS SULFATE 325 (65 FE) MG PO TABS
325.0000 mg | ORAL_TABLET | ORAL | Status: DC
  Administered 2023-10-28 – 2023-10-30 (×2): 325 mg via ORAL
  Filled 2023-10-28 (×2): qty 1

## 2023-10-28 MED ORDER — LACTATED RINGERS IV SOLN
INTRAVENOUS | Status: DC
Start: 2023-10-28 — End: 2023-10-28

## 2023-10-28 MED ORDER — NIFEDIPINE ER OSMOTIC RELEASE 60 MG PO TB24
60.0000 mg | ORAL_TABLET | Freq: Two times a day (BID) | ORAL | Status: DC
  Administered 2023-10-28 – 2023-10-30 (×4): 60 mg via ORAL
  Filled 2023-10-28 (×4): qty 1

## 2023-10-28 NOTE — Lactation Note (Signed)
 This note was copied from a baby's chart. Lactation Consultation Note  Patient Name: Misty [name] [id number] Date: 10/28/2023 Age:33 hours  Reason for consult: Follow-up assessment;Early term 37-38.6wks;Maternal endocrine disorder  P2, [redacted]w[redacted]d, 2% weight loss  Mother receptive to [facility] visit. She was talking on the phone and paused briefly to talk with LC. Mother states that she not pumped yet but was going to start pumping once baby gets her bath. Nurse tech present to do infant's bath. Mother says she only wants to pump and not latch baby to breast. Mother does not have a pump and wants a stork pump referral sent.   Instructed to pump every 3 hours for 15 minutes in the initiation setting to stimulate her milk production. Mother voluntarily explained pumping, settings, frequency, cleaning of pump parts and milk storage. Mother to call for assistance or questions as needed.   Stork pump referral sent.   Maternal Data Has patient been taught Hand Expression?: No Does the patient have breastfeeding experience prior to this delivery?: Yes How long did the patient breastfeed?: Ist baby NICU-pumped  Feeding Mother's Current Feeding Choice: Formula Nipple Type: Slow - flow  LATCH Score  Does not want to latch baby-pump only    Lactation Tools Discussed/Used Tools: Pump Pumping frequency: every 3 hours for 15 minutes in the initiation setting Pumped volume:  (mother states she has not pumped)  Interventions Interventions: Education  Discharge Pump:  (submitted for stork pump)  Consult Status Consult Status: Follow-up Date: 10/29/23 Follow-up type: In-patient    Christella Hartigan M 10/28/2023, 5:30 PM

## 2023-10-28 NOTE — Clinical Social Work Maternal (Signed)
 CLINICAL SOCIAL WORK MATERNAL/CHILD NOTE  Patient Details  Name: Misty [name] MRN: [id number] Date of Birth: 05/19/1991  Date:  2023/12/28  Clinical Social Worker Initiating Note:  Vivi [place], [place] Date/Time: Initiated:  10/28/23/1115     Child's Name:  Misty [name]   Biological Parents:  Mother ([name]: Chrisma Hurlock 06/16/1991)   Need for Interpreter:  None   Reason for Referral:  Current Substance Use/Substance Use During Pregnancy     Address:  [address] [place] [address]    Phone number:  [phone] (home)     Additional phone number:   Household Members/Support Persons (HM/SP):   Household Member/Support Person 1   HM/SP Name Relationship DOB or Age  HM/SP -1 Felma Pfefferle Daughter 06/22/2021  HM/SP -2        HM/SP -3        HM/SP -4        HM/SP -5        HM/SP -6        HM/SP -7        HM/SP -8          Natural Supports (not living in the home):  Extended Family   Professional Supports: None   Employment: Full-time   Type of Work: [employer]   Education:  [employer] school graduate   Homebound arranged:    Surveyor, quantity Resources:  [employer], Media planner    Other Resources:  Sales executive  , [employer]   Cultural/Religious Considerations Which May Impact Care:    Strengths:  Ability to meet basic needs  , Home prepared for child  , Pediatrician chosen   Psychotropic Medications:         Pediatrician:    Armed forces operational officer area  Pediatrician List:   [place] ([address] [employer])  [place]    [place]      Pediatrician Fax Number:    Risk Factors/Current Problems:  Substance Use     Cognitive State:  Able to Concentrate  , Goal Oriented  , Alert  , Insightful     Mood/Affect:  Calm  , Comfortable     CSW Assessment: CSW received consult  for CSW met with [name] to offer support and complete assessment.     CSW met with [name] at bedside and introduced CSW role. CSW observed [name] in bed, infant asleep in bassinet and a visitor present at bedside. [name] introduced the visitor as her cousin, Valentino Hue. CSW offered [name] privacy and [name] gave CSW permission to share all information with her cousin present in the room. [name] presented comfortable and engaged with CSW throughout the assessment. [name] welcomed CSW visit. [name] asked CSW could provide her with meal vouchers because did not like the cafeteria food. CSW explained the purpose for the meal voucher and that they are for individuals with financial constraints. [name] reported that she understood and asked if her cousin could have meal vouchers since she was staying overnight. CSW agreed to provide (2) meal vouchers for [name]'s cousin to use. [name] thanked CSW for the vouchers.   CSW asked if the demographic information on hospital file was correct. [name] reported that the address on file was not correct and provided her current address, [address] [name], [place] [address]. [name] reported that she employed at [employer] and receives WIC/EBT benefits. CSW inquired if  [name] had essential items to care for the infants. [name] reported that she had essential items but could use assistance with additional diapers and wipes. CSW discussed [facility] services and offered to make a referral. [name] gave CSW permission to make a referral. CSW inquired about [name] supports. [name] identified her cousin and cousin's mother as supports.   CSW inquired if [name] had mental health history. [name] denied mental history. CSW inquired if [name] had PPD symptoms with her older child. [name] reported having PPD symptoms as evidenced by her feeling sad and worried while her daughter was in the NICU for about 4 weeks. [name] reported that she felt much better when the infant discharged from the hospital CSW acknowledged and normalized [name] emotions with having an infant in the NICU. CSW provided education regarding the  baby blues period vs. perinatal mood disorders, discussed treatment and gave resources for mental health follow up if concerns arise.  CSW recommended [name] complete a self-evaluation during the postpartum time period using the New Mom Checklist from Postpartum Progress and encouraged [name] to contact a medical professional if symptoms are noted at any time. [name] reported that she feels comfortable reaching out to her provider if she has concerns. CSW assessed [name] for safety. [name] denied SI/HI.   CSW inquired about [name] substance use during the pregnancy. [name] reported that she "smoked weed" one time during the pregnancy about a week ago. [name] explained that she was "irritated" with her job, so she smoked to calm down. [name] denied using any other substance during the pregnancy. CSW informed [name] that infant's CDS would be monitored and a CPS reported would be made to report to CPS, if warranted. [name] reported that she understood.   CSW inquired if [name] had chosen a pediatrician for the infant. [name] reported that she had chosen [facility]. CSW provided review of Sudden Infant Death Syndrome (SIDS) precautions.   CSW identifies no further need for intervention and no barriers to discharge at this time.  -CSW made a referral to [employer], Appointment: November 11, 2023   CSW Plan/Description:  Perinatal Mood and Anxiety Disorder (PMADs) Education, Hospital Drug Screen Policy Information, CSW Will Continue to Monitor Umbilical Cord Tissue Drug Screen Results and Make Report if Warranted, Supplemental Security Income (SSI) Information, Sudden Infant Death Syndrome (SIDS) Education, No Further Intervention Required/No Barriers to Discharge    Clearance Coots, LCSW March 15, 2024, 3:19 PM

## 2023-10-28 NOTE — Progress Notes (Signed)
 Post Partum Day 1 - pLTCS NRFHT Subjective: no complaints and tolerating PO. Foley in place, not yet OOB  Objective: Blood pressure (!) 140/73, pulse 83, temperature 98.1 F (36.7 C), temperature source Oral, resp. rate 18, weight 102.2 kg, last menstrual period 02/10/2023, SpO2 96%, unknown if currently breastfeeding.  Physical Exam:  General: alert, cooperative, and no distress Lochia: appropriate Uterine Fundus: firm Incision: honeycomb dressing in place c/d/i DVT Evaluation: No evidence of DVT seen on physical exam. SCDs in place  Recent Labs    10/27/23 1723 10/28/23 0414  HGB 11.2* 9.8*  HCT 32.3* 28.3*    Assessment/Plan: Postpartum - Contraception: declined - MOF: breast - Rh status: Rh+ - Rubella status: RI - Dispo: anticipate discharge home POD3  Neonatal - Doing well at bedside - Circumcision: desired, will review consent POD2  3. SIPE w/ SF (BP) - Mag x 24h PP - asymptomatic - BP mild range, will increase to procardia XL 60mg  BID, continue labetalol 100mg  BID - lasix 20mg  w/ PO K x 5d PP - Cr improved to 0.99 this AM, UOP adequate. Dc foley & stop trending labs  4. T2DM - CBG fasting, TID - lantus 10u at bedtime w/ SSI prn - DM coordinator following  5. +RPR - Peds aware & patient notified - TPPA pending  6. Acute postoperative blood loss anemia due to anticipated surgical blood loss; clinically significant - PO iron every other day   LOS: 2 days   Misty [name] 10/28/2023, 12:01 PM

## 2023-10-29 DIAGNOSIS — A515 Early syphilis, latent: Secondary | ICD-10-CM | POA: Diagnosis present

## 2023-10-29 DIAGNOSIS — O09899 Supervision of other high risk pregnancies, unspecified trimester: Secondary | ICD-10-CM | POA: Diagnosis present

## 2023-10-29 LAB — CULTURE, OB URINE: Culture: NO GROWTH

## 2023-10-29 LAB — GLUCOSE, CAPILLARY
Glucose-Capillary: 154 mg/dL — ABNORMAL HIGH (ref 70–99)
Glucose-Capillary: 72 mg/dL (ref 70–99)
Glucose-Capillary: 98 mg/dL (ref 70–99)

## 2023-10-29 LAB — SURGICAL PATHOLOGY

## 2023-10-29 MED ORDER — LABETALOL HCL 200 MG PO TABS
200.0000 mg | ORAL_TABLET | Freq: Three times a day (TID) | ORAL | Status: DC
  Administered 2023-10-29 – 2023-10-30 (×3): 200 mg via ORAL
  Filled 2023-10-29 (×3): qty 1

## 2023-10-29 MED ORDER — LABETALOL HCL 200 MG PO TABS: 200.0000 mg | ORAL_TABLET | Freq: Two times a day (BID) | ORAL | Status: DC

## 2023-10-29 MED ORDER — PENICILLIN G BENZATHINE 1200000 UNIT/2ML IM SUSY
2.4000 10*6.[IU] | PREFILLED_SYRINGE | Freq: Once | INTRAMUSCULAR | Status: AC
  Administered 2023-10-29: 2.4 10*6.[IU] via INTRAMUSCULAR
  Filled 2023-10-29: qty 4

## 2023-10-29 MED ORDER — INSULIN ASPART 100 UNIT/ML IJ SOLN: 0.0000 [IU] | Freq: Three times a day (TID) | INTRAMUSCULAR | Status: DC

## 2023-10-29 NOTE — Progress Notes (Addendum)
 Post Partum Day 2 - pLTCS NRFHT Subjective: No  complaints. Tolerating PO, ambulating without difficulty, voiding spontaneously.  Objective: Blood pressure 136/74, pulse 75, temperature 98.2 F (36.8 C), temperature source Oral, resp. rate 19, weight 102.2 kg, last menstrual period 02/10/2023, SpO2 100%, unknown if currently breastfeeding.  Physical Exam:  General: alert, cooperative, and no distress Lochia: appropriate Uterine Fundus: firm Incision: honeycomb dressing in place c/d/i DVT Evaluation: No evidence of DVT seen on physical exam. SCDs in place  Recent Labs    10/27/23 1723 10/28/23 0414  HGB 11.2* 9.8*  HCT 32.3* 28.3*    Assessment/Plan: Postpartum - Contraception: declined - MOF: breast - Rh status: Rh+ - Rubella status: RI - Dispo: anticipate discharge home POD3  Neonatal - Moved to NICU for syphilis work up/treatment - Female infant  3. SIPE w/ SF (BP) - s/p Mag x 24h PP - asymptomatic - BP mild range on procardia XL 60mg  BID, increase to labetalol 200mg  BID - lasix 20mg  w/ PO K x 5d PP - Cr normalized yesterday, no longer trending  4. 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 Acute postoperative blood loss anemia due to anticipated surgical blood loss; clinically significant - PO iron every other day   LOS: 3 days   Misty [name] 10/29/2023, 2:17 PM

## 2023-10-29 NOTE — Lactation Note (Signed)
 This note was copied from a baby's chart.  NICU Lactation Consultation Note  Patient Name: Misty [name] Date: 10/29/2023 Age:33 hours  Reason for consult: Follow-up assessment; Early term 60-38.6wks; NICU baby; Maternal endocrine disorder; Infant < 6lbs; Other (Comment); Exclusive pumping and bottle feeding (cHTN, Pre-E, transfer from the [facility]) Type of Endocrine Disorder?: Diabetes (T2DM (glargine))  SUBJECTIVE Visited with family of 79 hours old ETI NICU female; baby "[name]" got admitted due to + PRP titer. [name] is a P2 and her plan is to exclusively pump and bottle feed. She has been pumping and getting small drops of colostrum, praised her for her efforts. Noticed that pumping hasn't been consistent. Explained the importance of consistent pumping for the onset of lactogenesis II and to protect her supply, she voiced understanding. She requested bags for breastmilk storage. Let her know that we won't use storage bags (due to being more prone to breakage/tearing) but bottles instead, they were provided.   OBJECTIVE Infant data: Mother's Current Feeding Choice: Breast Milk and Formula  O2 Device: Room Air  Infant feeding assessment IDFTS - Readiness: 2 IDFTS - Quality: 3   Maternal data: [id number] C-Section, Low Transverse Has patient been taught Hand Expression?: No Hand Expression Comments: needs teaching, mother was talking on the phone during consult Current breast feeding challenges:: NICU admission Does the patient have breastfeeding experience prior to this delivery?: Yes How long did the patient breastfeed?: Ist baby NICU-pumped Pumping frequency: 2 times/24 hours Pumped volume: 0 mL (drops) Flange Size: 21 Risk factor for low/delayed milk supply:: C/S, T2DM, Pre-E, < 6 lbs, infant separation  Pump: Stork Pump (Spectra S2)  ASSESSMENT Infant: Feeding Status: Ad lib Feeding method: Bottle Nipple Type: Slow - flow  Maternal: Milk volume:  Normal  INTERVENTIONS/PLAN Interventions: Interventions: Breast feeding basics reviewed; DEBP; Education; Coconut oil; NICU Pumping Log Tools: Flanges; Coconut oil; Pump Pump Education: Setup, frequency, and cleaning; Milk Storage  Plan: STS whenever possible Pump both breasts in initiation mode every 3 hours for 15 minutes, ideally 8 pumping sessions/24 hours  No other support person at this time. All questions and concerns answered, family to contact [facility] services PRN.  Consult Status: NICU follow-up NICU Follow-up type: Maternal D/C visit   Misty [name] 10/29/2023, 2:15 PM

## 2023-10-29 NOTE — Plan of Care (Signed)
  Problem: Education: Goal: Knowledge of disease or condition will improve Outcome: Progressing Goal: Knowledge of the prescribed therapeutic regimen will improve Outcome: Progressing   Problem: Fluid Volume: Goal: Peripheral tissue perfusion will improve Outcome: Progressing   Problem: Clinical Measurements: Goal: Complications related to disease process, condition or treatment will be avoided or minimized Outcome: Progressing   Problem: Education: Goal: Knowledge of General Education information will improve Description: Including pain rating scale, medication(s)/side effects and non-pharmacologic comfort measures Outcome: Progressing   Problem: Health Behavior/Discharge Planning: Goal: Ability to manage health-related needs will improve Outcome: Progressing

## 2023-10-30 ENCOUNTER — Other Ambulatory Visit (HOSPITAL_COMMUNITY): Payer: Self-pay

## 2023-10-30 LAB — GLUCOSE, CAPILLARY: Glucose-Capillary: 92 mg/dL (ref 70–99)

## 2023-10-30 MED ORDER — OXYCODONE HCL 5 MG PO TABS
5.0000 mg | ORAL_TABLET | ORAL | 0 refills | Status: DC | PRN
  Filled 2023-10-30: qty 30, 5d supply, fill #0

## 2023-10-30 MED ORDER — GABAPENTIN 100 MG PO CAPS
200.0000 mg | ORAL_CAPSULE | Freq: Two times a day (BID) | ORAL | 2 refills | Status: DC
  Filled 2023-10-30: qty 30, 8d supply, fill #0
  Filled 2023-12-05: qty 30, 8d supply, fill #1

## 2023-10-30 MED ORDER — LABETALOL HCL 200 MG PO TABS
200.0000 mg | ORAL_TABLET | Freq: Three times a day (TID) | ORAL | 1 refills | Status: DC
  Filled 2023-10-30: qty 60, 20d supply, fill #0

## 2023-10-30 MED ORDER — POTASSIUM CHLORIDE CRYS ER 20 MEQ PO TBCR
20.0000 meq | EXTENDED_RELEASE_TABLET | Freq: Every day | ORAL | 0 refills | Status: DC
  Filled 2023-10-30: qty 5, 5d supply, fill #0

## 2023-10-30 MED ORDER — NIFEDIPINE ER 60 MG PO TB24
60.0000 mg | ORAL_TABLET | Freq: Two times a day (BID) | ORAL | 2 refills | Status: DC
  Filled 2023-10-30: qty 120, 60d supply, fill #0

## 2023-10-30 MED ORDER — FUROSEMIDE 20 MG PO TABS
20.0000 mg | ORAL_TABLET | Freq: Every day | ORAL | 0 refills | Status: DC
  Filled 2023-10-30: qty 5, 5d supply, fill #0

## 2023-10-30 MED ORDER — ACETAMINOPHEN 500 MG PO TABS
1000.0000 mg | ORAL_TABLET | Freq: Four times a day (QID) | ORAL | 2 refills | Status: DC | PRN
  Filled 2023-10-30: qty 100, 13d supply, fill #0

## 2023-10-30 MED ORDER — BASAGLAR KWIKPEN 100 UNIT/ML ~~LOC~~ SOPN
10.0000 [IU] | PEN_INJECTOR | Freq: Every day | SUBCUTANEOUS | 2 refills | Status: DC
  Filled 2023-10-30: qty 3, 30d supply, fill #0
  Filled 2023-12-05: qty 3, 30d supply, fill #1

## 2023-10-30 MED ORDER — IBUPROFEN 800 MG PO TABS
800.0000 mg | ORAL_TABLET | Freq: Three times a day (TID) | ORAL | 2 refills | Status: DC | PRN
  Filled 2023-10-30: qty 60, 20d supply, fill #0

## 2023-10-30 MED ORDER — FERROUS SULFATE 325 (65 FE) MG PO TABS
325.0000 mg | ORAL_TABLET | ORAL | 3 refills | Status: DC
  Filled 2023-10-30: qty 30, 60d supply, fill #0

## 2023-10-30 MED ORDER — SENNOSIDES-DOCUSATE SODIUM 8.6-50 MG PO TABS
2.0000 | ORAL_TABLET | Freq: Every evening | ORAL | 2 refills | Status: DC | PRN
  Filled 2023-10-30: qty 30, 15d supply, fill #0

## 2023-10-30 NOTE — Discharge Summary (Signed)
 Postpartum Discharge Summary      Patient Name: Misty [name] DOB: [birth year] MRN: [id number]  Date of admission: 10/26/2023 Delivery date:10/27/2023 Delivering provider: Warden Fillers Date of discharge: 10/30/2023  Admitting diagnosis: Chronic hypertension affecting pregnancy [O10.919] Intrauterine pregnancy: [redacted]w[redacted]d     Secondary diagnosis:  Active Problems:   Anemia in pregnancy   Severe preeclampsia   Type 2 diabetes mellitus (HCC)   Diabetes mellitus affecting pregnancy in first trimester   Supervision of high risk pregnancy, antepartum   Chronic hypertension affecting pregnancy   S/P cesarean section   Anemia   Positive RPR test   Early syphilis, latent  Additional problems: None    Discharge diagnosis: Term Pregnancy Delivered, CHTN with superimposed preeclampsia, and Type 2 DM                                              Post partum procedures: None Augmentation: Pitocin Complications: None  Hospital course: Onset of Labor With Unplanned C/S   33 y.o. yo [id number] at [redacted]w[redacted]d was admitted in Latent Labor on 10/26/2023. Patient had a labor course significant for [employer] with superimposed severe preeclampsia. The patient went for cesarean section due to Non-Reassuring FHR. Delivery details as follows: Membrane Rupture Time/Date: 1:40 AM,10/27/2023  Delivery Method:C-Section, Low Transverse Details of operation can be found in separate operative note.   Patient received intrapartum and postpartum magnesium sulfate for eclampsia prophylaxis as per protocol. BP control was obtained with Procardia XL 60 mg bid and Labetalol 200 mg po tid and she was started on Lasix 20 mg po qd x 5 days, there were no further immediate BP complications. Patient was also diagnosed with early latent syphilis, was treated with PCN 2.4 million units x 1 on 10/29/23.   Her T2DM was contoelled on Lantus 10 units at bedtime.  Otherwise, patient had a routine postpartum course. She is ambulating, tolerating a  regular diet, passing flatus, and urinating well. Patient is discharged home in stable condition on 10/30/2023, and will follow up in the office for BP check in one week.  Newborn Data: Birth date:10/27/2023 Birth time:1:31 PM Gender:Female Living status:Living Apgars:3 ,9  Weight:2640 g  Magnesium Sulfate received: Yes: Seizure prophylaxis BMZ received: No Transfusion:No Immunizations administered: Immunization History  Administered Date(s) Administered   DTaP 11/10/1990, 01/12/1991, 03/19/1991, 12/29/1991, 01/22/1995   HIB (PRP-T) 11/10/1990, 01/12/1991, 03/19/1991, 12/29/1991   HPV Quadrivalent 06/13/2005, 09/11/2005, 12/13/2005   Hepatitis A, Ped/Adol-2 Dose 05/15/2005, 12/14/2005   Hepatitis B, PED/ADOLESCENT 01/22/1995, 03/27/1995, 08/30/1995, 05/06/2002, 02/02/2003   IPV 11/10/1990, 01/12/1991, 12/29/1991, 01/22/1995   Influenza, Seasonal, Injecte, Preservative Fre 09/30/2015, 05/21/2023   Influenza,inj,Quad PF,6+ Mos 11/30/2013, 09/17/2014, 09/04/2016, 06/26/2017, 05/05/2021, 06/01/2022   MMR 12/29/1991, 01/22/1995   Meningococcal Conjugate 04/18/2005   PPD Test 12/11/2013, 03/19/2014, 10/24/2022, 11/07/2022   Tdap 10/17/2007, 09/28/2020, 05/05/2021    Physical exam  Vitals:   10/29/23 1923 10/30/23 0050 10/30/23 0341 10/30/23 0752  BP: (!) 142/76 (!) 144/73 138/70 123/64  Pulse: 68 93 68 88  Resp: 18 20 20 20   Temp: 98.3 F (36.8 C) 98.2 F (36.8 C) 98.3 F (36.8 C) 98.4 F (36.9 C)  TempSrc: Oral Oral Oral Oral  SpO2: 100% 100% 97% 98%  Weight:      Height: 5\' 3"  (1.6 m)      General: alert, cooperative, and no distress Lochia: appropriate Uterine Fundus: firm  Incision: Healing well with no significant drainage, Dressing is clean, dry, and intact DVT Evaluation: No evidence of DVT seen on physical exam. Negative Homan's sign. No cords or calf tenderness. No significant calf/ankle edema. Labs: Lab Results  Component Value Date   WBC 17.2 (H) 10/28/2023    HGB 9.8 (L) 10/28/2023   HCT 28.3 (L) 10/28/2023   MCV 92.2 10/28/2023   PLT 305 10/28/2023      Latest Ref Rng & Units 10/28/2023    4:14 AM  CMP  Glucose 70 - 99 mg/dL 956   BUN 6 - 20 mg/dL <5   Creatinine 2.13 - 1.00 mg/dL 0.86   Sodium 578 - 469 mmol/L 133   Potassium 3.5 - 5.1 mmol/L 3.5   Chloride 98 - 111 mmol/L 104   CO2 22 - 32 mmol/L 22   Calcium 8.9 - 10.3 mg/dL 7.3   Total Protein 6.5 - 8.1 g/dL 5.5   Total Bilirubin 0.0 - 1.2 mg/dL 0.3   Alkaline Phos 38 - 126 U/L 96   AST 15 - 41 U/L 22   ALT 0 - 44 U/L 13    Edinburgh Score:    08/07/2021    9:22 AM  Edinburgh Postnatal Depression Scale Screening Tool  I have been able to laugh and see the funny side of things. 0  I have looked forward with enjoyment to things. 0  I have blamed myself unnecessarily when things went wrong. 0  I have been anxious or worried for no good reason. 0  I have felt scared or panicky for no good reason. 0  Things have been getting on top of me. 0  I have been so unhappy that I have had difficulty sleeping. 0  I have felt sad or miserable. 0  I have been so unhappy that I have been crying. 0  The thought of harming myself has occurred to me. 0  Edinburgh Postnatal Depression Scale Total 0      After visit meds:  Allergies as of 10/30/2023       Reactions   Iodides Anaphylaxis, Itching   Morphine And Codeine Anaphylaxis, Itching   Shellfish Allergy Anaphylaxis, Itching   Pt reports "watery eyes"   Ultram [tramadol Hcl] Hives   Hives and swollen lips         Medication List     STOP taking these medications    aspirin EC 81 MG tablet   metoCLOPramide 10 MG tablet Commonly known as: REGLAN   ondansetron 4 MG tablet Commonly known as: ZOFRAN   scopolamine 1 MG/3DAYS Commonly known as: TRANSDERM-SCOP       TAKE these medications    acetaminophen 500 MG tablet Commonly known as: TYLENOL Take 2 tablets (1,000 mg total) by mouth every 6 (six) hours as  needed for mild pain (pain score 1-3). What changed: how much to take   Basaglar KwikPen 100 UNIT/ML Inject 10 Units into the skin daily. What changed:  how much to take when to take this   Blood Glucose Monitoring Suppl Devi 1 each by Does not apply route in the morning, at noon, and at bedtime. May substitute to any manufacturer covered by patient's insurance.   Blood Pressure Monitor Automat Devi 1 each by Does not apply route daily.   ferrous sulfate 325 (65 FE) MG tablet Take 1 tablet (325 mg total) by mouth every other day.   furosemide 20 MG tablet Commonly known as: LASIX Take 1 tablet (20 mg  total) by mouth daily for 5 days.   gabapentin 100 MG capsule Commonly known as: NEURONTIN Take 2 capsules (200 mg total) by mouth 2 (two) times daily at 8 am and 10 pm.   ibuprofen 800 MG tablet Commonly known as: ADVIL Take 1 tablet (800 mg total) by mouth 3 (three) times daily with meals as needed for headache, moderate pain (pain score 4-6) or cramping.   labetalol 200 MG tablet Commonly known as: NORMODYNE Take 1 tablet (200 mg total) by mouth 3 (three) times daily. What changed:  medication strength how much to take when to take this   NIFEdipine 60 MG 24 hr tablet Commonly known as: ADALAT CC Take 1 tablet (60 mg total) by mouth 2 (two) times daily. What changed: when to take this   oxyCODONE 5 MG immediate release tablet Commonly known as: Oxy IR/ROXICODONE Take 1 tablet (5 mg total) by mouth every 4 (four) hours as needed for severe pain (pain score 7-10) or breakthrough pain.   potassium chloride SA 20 MEQ tablet Commonly known as: KLOR-CON M Take 1 tablet (20 mEq total) by mouth daily for 5 days.   Prenatal Vitamin 27-0.8 MG Tabs Take 1 tablet by mouth daily.   senna-docusate 8.6-50 MG tablet Commonly known as: Senokot-S Take 2 tablets by mouth at bedtime as needed for mild constipation or moderate constipation.               Discharge Care  Instructions  (From admission, onward)           Start     Ordered   10/30/23 0000  Discharge wound care:       Comments: As per discharge handout and nursing instructions   10/30/23 0957           Discharge home in stable condition Infant Feeding: Breast Infant Disposition:NICU Discharge instruction: per After Visit Summary and Postpartum booklet. Activity: Advance as tolerated. Pelvic rest for 6 weeks.  Diet: routine diet Anticipated Birth Control:  Declined Postpartum Appointment:1 week Additional Postpartum F/U:  4 week PP visit     10/30/2023 Jaynie Collins, MD

## 2023-10-30 NOTE — Lactation Note (Signed)
 This note was copied from a baby's chart.  NICU Lactation Consultation Note  Patient Name: Misty [name] [id number] Date: 10/30/2023 Age:33 hours  Reason for consult: Follow-up assessment; NICU baby; Early term 24-38.6wks; Infant < 6lbs; Maternal endocrine disorder; Exclusive pumping and bottle feeding; Maternal discharge; Other (Comment) (cHTN, Pre-E, transfer from the [facility]) Type of Endocrine Disorder?: Diabetes (T2DM (glargine insulin))  SUBJECTIVE Visited with family of 67 50/51 weeks old AGA NICU female; Ms. Kroboth is a P2 and reported she's pumping for baby "[name]" but only getting drops; praised her for her efforts. She wishes she could get an ample supply, like "milking a cow". Re-educated about the importance of pumping this early on mainly for breast stimulation and not get volume. Noticed that pumping is still not consistent, she's getting discharged from [facility] today. Reviewed discharge education, pump settings and the importance of consistent pumping for the onset of lactogenesis II and the prevention of engorgement.   OBJECTIVE Infant data: Mother's Current Feeding Choice: Breast Milk and Formula  O2 Device: Room Air  Infant feeding assessment IDFTS - Readiness: 2 IDFTS - Quality: 2   Maternal data: [id number] C-Section, Low Transverse Current breast feeding challenges:: NICU admission Pumping frequency: 2-3 times/24 hours Pumped volume: 0 mL (more drops today) Flange Size: 21 Risk factor for low/delayed milk supply:: C/S, T2DM, Pre-E, < 6 lbs, infant separation  Pump: Stork Pump (Spectra S2)  ASSESSMENT Infant: Feeding Status: Scheduled 9-12-3-6 Feeding method: Bottle Nipple Type: Nfant Extra Slow Flow (gold)  Maternal: Milk volume: Normal  INTERVENTIONS/PLAN Interventions: Interventions: Breast feeding basics reviewed; Coconut oil; DEBP; Education Discharge Education: Engorgement and breast care Tools: Flanges; Coconut oil; Pump Pump Education: Setup,  frequency, and cleaning; Milk Storage  Plan: STS whenever possible Pump both breasts in initiation mode every 3 hours for 15 minutes, ideally 8 pumping sessions/24 hours Take all pump parts to baby's room after her discharge Switch pump setting to maintain mode once expressing +20 ml of EBM or by day 5, whatever happens first   No other support person at this time. All questions and concerns answered, family to contact [facility] services PRN.  Consult Status: NICU follow-up NICU Follow-up type: Verify absence of engorgement; Verify onset of copious milk   Valon Glasscock S Jerald Hennington 10/30/2023, 12:11 PM

## 2023-10-30 NOTE — Op Note (Signed)
 [name] PROCEDURE DATE: 10/30/2023  PREOPERATIVE DIAGNOSES: Intrauterine pregnancy at [redacted]w[redacted]d weeks gestation; non-reassuring fetal status  POSTOPERATIVE DIAGNOSES: The same  PROCEDURE: Primary Low Transverse Cesarean Section  SURGEON:  Dr. Mariel Aloe  ASSISTANT:  Wyn Forster, MD  ANESTHESIOLOGY TEAM: Anesthesiologist: Beryle Lathe, MD; Lowella Curb, MD CRNA: Renford Dills, CRNA  INDICATIONS: Misty [name] is a 33 y.o. [phone] at [redacted]w[redacted]d here for cesarean section secondary to the indications listed under preoperative diagnoses; please see preoperative note for further details.  The risks of surgery were discussed with the patient including but were not limited to: bleeding which may require transfusion or reoperation; infection which may require antibiotics; injury to bowel, bladder, ureters or other surrounding organs; injury to the fetus; need for additional procedures including hysterectomy in the event of a life-threatening hemorrhage; formation of adhesions; placental abnormalities wth subsequent pregnancies; incisional problems; thromboembolic phenomenon and other postoperative/anesthesia complications.  The patient concurred with the proposed plan, giving informed written consent for the procedure.    FINDINGS:  Viable female infant in cephalic presentation.  Apgars 3 and 9.  Clear amniotic fluid.  Intact placenta, three vessel cord.  Normal uterus, fallopian tubes and ovaries bilaterally.  An experienced assistant was required given the standard of surgical care given the complexity of the case.  This assistant was needed for exposure, dissection, suctioning, retraction, instrument exchange,  assisting with delivery with administration of fundal pressure, and for overall help during the procedure.    ANESTHESIA: Epidural  INTRAVENOUS FLUIDS: 1700 ml   ESTIMATED BLOOD LOSS: 449 ml URINE OUTPUT:  125 ml SPECIMENS: Placenta sent to L&D COMPLICATIONS: None  immediate  PROCEDURE IN DETAIL:  The patient preoperatively received intravenous antibiotics and had sequential compression devices applied to her lower extremities.  She was then taken to the operating room where the epidural anesthesia was dosed up to surgical level and was found to be adequate. She was then placed in a dorsal supine position with a leftward tilt, and prepped and draped in a sterile manner.  A foley catheter was placed into her bladder and attached to constant gravity.  After an adequate timeout was performed, a Pfannenstiel skin incision was made with scalpel two fingerbreaths above the pubic symphysis and carried through to the underlying layer of fascia. The fascia was incised in the midline, and this incision was extended bilaterally using the Mayo scissors.  Kocher clamps x 2 were applied to the superior aspect of the fascial incision and the underlying rectus muscles were dissected off bluntly and sharply.  A similar process was carried out on the inferior aspect of the fascial incision. The rectus muscles were separated in the midline and the peritoneum was entered bluntly. The Alexis self-retaining retractor was introduced into the abdominal cavity.  The vesicouterine peritoneum was identified and grasped using smooth pickups.  It was incised and extended laterally using the Metzenbaum scissors.  A bladder flap was then digitally created.  Attention was turned to the lower uterine segment where a low transverse hysterotomy was made with a scalpel and extended bilaterally bluntly.  The infant was successfully delivered, the cord was clamped and cut after one minute, and the infant was handed over to the awaiting neonatology team. Uterine massage was then administered, and the placenta delivered intact with a three-vessel cord. The uterus was then cleared of clots and debris using manual curettage.  The uterine incision was closed with 0 monocryl  in a running locked fashion.  Figure-of-eight 0 monocryl serosal stitches were placed to help with hemostasis.  The pelvis was cleared of all clot and debris with irrigation and suction. Hemostasis was confirmed on all surfaces.  The retractor was removed.  The peritoneum was closed with a 2-0 Vicryl running stitch. The fascia was then closed using 0 Vicryl in a running fashion.  The subcutaneous layer was irrigated, reapproximated with 2-0 plain gut interrupted stitches, and the skin was closed with a 4-0 Vicryl subcuticular stitch. The patient tolerated the procedure well. Sponge, instrument and needle counts were correct x 3.  She was taken to the recovery room in stable condition.    Mariel Aloe, MD, FACOG Obstetrician & Gynecologist, [facility] for [facility], [facility]

## 2023-10-30 NOTE — Progress Notes (Signed)
 Pt discharged home in stable condition after discharge instructions given. Pt verbalized understanding and all questions were answered. IV was discontinued and pt was sent home with all belongings and prescriptions.

## 2023-11-02 ENCOUNTER — Ambulatory Visit (HOSPITAL_COMMUNITY): Payer: Self-pay

## 2023-11-02 NOTE — Lactation Note (Signed)
 This note was copied from a baby's chart.  NICU Lactation Consultation Note  Patient Name: Misty [name] [id number] Date: 11/02/2023 Age:33 days  Reason for consult: Follow-up assessment; NICU baby; Early term 46-38.6wks; Maternal endocrine disorder Type of Endocrine Disorder?: Diabetes  SUBJECTIVE  LC in to visit with P2 Mom of ET infant in the NICU.  Baby "Misty [name]" is now ad lib and receiving IV antibiotics for +RPR.  Mom states she is only getting drops when she pumps and she is pumping every 3 hrs.  Mom hasn't brought any milk in.  Mom denies engorgement and states her breasts aren't any fuller.  While LC continued to talk, Mom was filling out a form and didn't engage.  Mom was very pleasant, but LC knew to curtail conversation.   LC did mention the benefits of STS and hand expressing drop onto finger.  Mom states she hasn't tried to latch baby to the breast.  LC offered assistance and encouraged Mom to ask for support prn.  LC mentioned that the stress of having a baby in the NICU can lead to delayed milk volume onset.  Mom reports that she didn't get any milk with her first baby when she was pumping also.    OBJECTIVE Infant data: No data recorded O2 Device: Room Air  Infant feeding assessment IDFTS - Readiness: 1 IDFTS - Quality: 2   Maternal data: [id number] C-Section, Low Transverse Pumping frequency: 8 times per 24 hrs Pumped volume: 0 mL (drops) Flange Size: 21  Pump: Stork Pump (Spectra S2)  ASSESSMENT Infant:  Feeding Status: Ad lib Feeding method: Bottle Nipple Type: Dr. Levert Feinstein Preemie  Maternal: Milk volume: Low  INTERVENTIONS/PLAN Interventions: Interventions: Skin to skin; Breast massage; Hand express; DEBP Tools: Pump; Flanges; Bottle  Plan: Consult Status: NICU follow-up NICU Follow-up type: Weekly NICU follow up   Judee Clara 11/02/2023, 2:03 PM

## 2023-11-04 ENCOUNTER — Telehealth: Payer: Self-pay | Admitting: *Deleted

## 2023-11-04 NOTE — Telephone Encounter (Signed)
 [employer] calling to verify date of negative RPR in August of 2024. Information provided.

## 2023-11-05 ENCOUNTER — Telehealth (INDEPENDENT_AMBULATORY_CARE_PROVIDER_SITE_OTHER): Payer: Self-pay | Admitting: Primary Care

## 2023-11-05 NOTE — Telephone Encounter (Signed)
 Spoke to pt about atp.. Will be present

## 2023-11-06 ENCOUNTER — Ambulatory Visit: Payer: 59 | Admitting: *Deleted

## 2023-11-06 ENCOUNTER — Other Ambulatory Visit: Payer: Self-pay

## 2023-11-06 VITALS — BP 114/66 | HR 93 | Ht 63.0 in | Wt 206.2 lb

## 2023-11-06 DIAGNOSIS — O9812 Syphilis complicating childbirth: Secondary | ICD-10-CM

## 2023-11-06 DIAGNOSIS — Z4889 Encounter for other specified surgical aftercare: Secondary | ICD-10-CM

## 2023-11-06 DIAGNOSIS — Z0130 Encounter for examination of blood pressure without abnormal findings: Secondary | ICD-10-CM

## 2023-11-06 NOTE — Progress Notes (Signed)
 Pt presents for BP check and incision check following C/S on 2/23.  BP- 114/66, P - 93.  Per consult w/Dr. Nobie Putnam, pt was instructed to stop taking Labetalol and continue Nifedipine as prescribed. She will need BP check in one week. Honeycomb dressing removed from operative site. Incision was found to be well healed. No redness, swelling or drainage was noted.  Pt was informed of proper daily cleansing of incision. 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 Per chart review, she will need follow up titers in 6 and 12 months per Dr. Berton Lan note on 2/25.  She voiced understanding of all information and instructions given. Pt will keep PP appt as scheduled on 12/05/23.

## 2023-11-11 ENCOUNTER — Ambulatory Visit (INDEPENDENT_AMBULATORY_CARE_PROVIDER_SITE_OTHER): Payer: Self-pay | Admitting: Primary Care

## 2023-11-13 ENCOUNTER — Ambulatory Visit

## 2023-11-18 ENCOUNTER — Other Ambulatory Visit (HOSPITAL_COMMUNITY): Payer: Self-pay

## 2023-11-18 ENCOUNTER — Ambulatory Visit

## 2023-11-18 ENCOUNTER — Telehealth (INDEPENDENT_AMBULATORY_CARE_PROVIDER_SITE_OTHER): Payer: Self-pay | Admitting: Primary Care

## 2023-11-18 NOTE — Telephone Encounter (Signed)
 Spoke to pt about appt.. Will be present

## 2023-11-21 ENCOUNTER — Ambulatory Visit: Admitting: Physician Assistant

## 2023-11-25 ENCOUNTER — Ambulatory Visit (INDEPENDENT_AMBULATORY_CARE_PROVIDER_SITE_OTHER): Admitting: Primary Care

## 2023-11-26 ENCOUNTER — Other Ambulatory Visit: Payer: Self-pay | Admitting: Obstetrics & Gynecology

## 2023-11-26 ENCOUNTER — Other Ambulatory Visit: Payer: Self-pay

## 2023-11-26 ENCOUNTER — Ambulatory Visit

## 2023-11-26 VITALS — BP 126/78 | HR 95 | Ht 63.0 in | Wt 204.0 lb

## 2023-11-26 DIAGNOSIS — Z0130 Encounter for examination of blood pressure without abnormal findings: Secondary | ICD-10-CM

## 2023-11-26 NOTE — Progress Notes (Signed)
 Blood Pressure Check Visit  Misty [name] is here for blood pressure check following her last BP check on 11/06/23 where she was advised to stop taking labetalol and continue Nifedipine 60 mg BID. Patient had a c-section on 10/27/23. BP today is 126/78. Patient denies any dizziness, blurred vision, headache, shortness of breath or peripheral edema. Encouraged ambulating and alternating tylenol and ibuprofen to reduce pain. Patient will follow up for her PP visit on 12/05/23 at 1:35 PM.   Quintella Reichert, RN 11/26/2023  1:55 PM

## 2023-11-27 ENCOUNTER — Encounter (HOSPITAL_COMMUNITY): Payer: Self-pay

## 2023-11-27 ENCOUNTER — Other Ambulatory Visit (HOSPITAL_COMMUNITY): Payer: Self-pay

## 2023-12-05 ENCOUNTER — Other Ambulatory Visit (HOSPITAL_COMMUNITY): Payer: Self-pay

## 2023-12-05 ENCOUNTER — Other Ambulatory Visit: Payer: Self-pay

## 2023-12-05 ENCOUNTER — Ambulatory Visit: Payer: 59 | Admitting: Obstetrics and Gynecology

## 2023-12-05 ENCOUNTER — Encounter: Payer: Self-pay | Admitting: Family Medicine

## 2023-12-05 DIAGNOSIS — O119 Pre-existing hypertension with pre-eclampsia, unspecified trimester: Secondary | ICD-10-CM

## 2023-12-05 DIAGNOSIS — E1169 Type 2 diabetes mellitus with other specified complication: Secondary | ICD-10-CM | POA: Diagnosis not present

## 2023-12-05 DIAGNOSIS — I10 Essential (primary) hypertension: Secondary | ICD-10-CM

## 2023-12-05 NOTE — Progress Notes (Signed)
 Post Partum Visit Note  Misty [name] is a 33 y.o. [phone] female who presents for a postpartum visit. She is 5 weeks postpartum following a cesarean section.  I have fully reviewed the prenatal and intrapartum course. The delivery was at 37 gestational weeks.  Anesthesia: epidural. Postpartum course has been good. Baby is doing well. Baby is feeding by bottle -   . Bleeding staining only. Bowel function is normal. Bladder function is normal. Patient is not sexually active. Contraception method is  declined . Postpartum depression screening: negative.   The pregnancy intention screening data noted above was reviewed. Potential methods of contraception were discussed. The patient elected to proceed with condoms    Health Maintenance Due  Topic Date Due   Pneumococcal Vaccine 3-65 Years old (1 of 2 - PCV) Never done   OPHTHALMOLOGY EXAM  Never done   FOOT EXAM  11/07/2023    The following portions of the patient's history were reviewed and updated as appropriate: allergies, current medications, past family history, past medical history, past social history, past surgical history, and problem list.  Review of Systems Pertinent items are noted in HPI.  Objective:  BP 128/81 (BP Location: Right Arm, Patient Position: Sitting, Cuff Size: Large)   Pulse 81   Ht 5\' 3"  (1.6 m)   Wt 201 lb 8 oz (91.4 kg)   LMP 02/10/2023 (Approximate)   SpO2 99%   BMI 35.69 kg/m    General:  alert and cooperative   Breasts:  not indicated  Lungs: Normal effort     Abdomen: soft    Wound well approximated incision  GU exam:  not indicated       Assessment:  1. 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 Type 2 diabetes mellitus with other specified complication, unspecified whether long term  insulin use (HCC) Currently on insulin, was on insulin prior to pregnancy, follow up PCP  3. Chronic hypertension with superimposed pre-eclampsia 4. Chronic hypertension Doing well on nifedipine 60 mg BID, coninue current regimen    Plan:   Essential components of care per ACOG recommendations:  1.  Mood and well being: Patient with negative depression screening today. Reviewed local resources for support.  - Patient tobacco use? No.    2. Infant care and feeding:  -Patient currently breastmilk feeding? bottle  -Social determinants of health (SDOH) reviewed in EPIC. No concerns  3. Sexuality, contraception and birth spacing - Patient does not want a pregnancy in the next year.  Desired family size is  children.  - Reviewed reproductive life planning. Reviewed contraceptive methods based on pt preferences and effectiveness.  Patient desired Female Condom today.   - Discussed birth spacing of 18 months  4. Sleep and fatigue -Encouraged family/partner/community support of 4 hrs of uninterrupted sleep to help with mood and fatigue  5. Physical Recovery  - Discussed patients delivery and complications. She describes her labor as good. - Patient had a C-section. Patient had a  none  laceration. Perineal healing reviewed. Patient expressed understanding - Patient has urinary incontinence? No. - Patient is safe to resume physical and sexual activity  6.  Health Maintenance - HM due items addressed Yes - Last pap smear  Diagnosis  Date Value Ref Range Status  02/15/2021  Final   - Negative for intraepithelial lesion or malignancy (NILM)   Pap smear not done at today's visit.  -Breast Cancer screening indicated? No.   7. Chronic Disease/Pregnancy Condition follow up:  T2DM, CHTN  - PCP follow up  Albertine Grates, FNP Center for [employer], [facility]

## 2023-12-17 ENCOUNTER — Other Ambulatory Visit (HOSPITAL_COMMUNITY): Payer: Self-pay

## 2024-01-23 ENCOUNTER — Telehealth (INDEPENDENT_AMBULATORY_CARE_PROVIDER_SITE_OTHER): Payer: Self-pay | Admitting: Primary Care

## 2024-01-23 NOTE — Telephone Encounter (Signed)
 Called pt to confirm appt. Pt will be present.

## 2024-01-24 ENCOUNTER — Encounter (INDEPENDENT_AMBULATORY_CARE_PROVIDER_SITE_OTHER): Payer: Self-pay | Admitting: Primary Care

## 2024-01-24 ENCOUNTER — Encounter (INDEPENDENT_AMBULATORY_CARE_PROVIDER_SITE_OTHER): Admitting: Primary Care

## 2024-01-24 ENCOUNTER — Other Ambulatory Visit (INDEPENDENT_AMBULATORY_CARE_PROVIDER_SITE_OTHER): Payer: Self-pay | Admitting: Primary Care

## 2024-01-24 DIAGNOSIS — M62830 Muscle spasm of back: Secondary | ICD-10-CM

## 2024-01-24 MED ORDER — MELOXICAM 7.5 MG PO TABS: 7.5000 mg | ORAL_TABLET | Freq: Every day | ORAL | 1 refills | Status: DC

## 2024-01-29 ENCOUNTER — Other Ambulatory Visit: Payer: Self-pay | Admitting: Lactation Services

## 2024-01-29 DIAGNOSIS — Z113 Encounter for screening for infections with a predominantly sexual mode of transmission: Secondary | ICD-10-CM

## 2024-01-29 MED ORDER — PRENATAL VITAMIN 27-0.8 MG PO TABS: 1.0000 | ORAL_TABLET | Freq: Every day | ORAL | 11 refills | Status: DC

## 2024-02-04 ENCOUNTER — Other Ambulatory Visit (INDEPENDENT_AMBULATORY_CARE_PROVIDER_SITE_OTHER): Payer: Self-pay | Admitting: Family Medicine

## 2024-02-04 DIAGNOSIS — O24112 Pre-existing diabetes mellitus, type 2, in pregnancy, second trimester: Secondary | ICD-10-CM

## 2024-02-07 ENCOUNTER — Encounter (INDEPENDENT_AMBULATORY_CARE_PROVIDER_SITE_OTHER): Admitting: Primary Care

## 2024-02-18 ENCOUNTER — Telehealth (INDEPENDENT_AMBULATORY_CARE_PROVIDER_SITE_OTHER): Payer: Self-pay | Admitting: Primary Care

## 2024-02-18 NOTE — Telephone Encounter (Signed)
 Called pt to confirm appt. Pt did not answer and LVM

## 2024-02-19 ENCOUNTER — Encounter (INDEPENDENT_AMBULATORY_CARE_PROVIDER_SITE_OTHER): Admitting: Primary Care

## 2024-03-05 ENCOUNTER — Encounter (INDEPENDENT_AMBULATORY_CARE_PROVIDER_SITE_OTHER): Admitting: Primary Care

## 2024-03-11 ENCOUNTER — Encounter (INDEPENDENT_AMBULATORY_CARE_PROVIDER_SITE_OTHER): Admitting: Primary Care

## 2024-03-11 ENCOUNTER — Other Ambulatory Visit (INDEPENDENT_AMBULATORY_CARE_PROVIDER_SITE_OTHER): Payer: Self-pay | Admitting: Primary Care

## 2024-03-11 NOTE — Telephone Encounter (Signed)
 Copied from CRM [id number]. Topic: Clinical - Medication Refill >> Mar 11, 2024  9:50 AM Cynthia K wrote: Medication: NIFEdipine  (ADALAT  CC) 60 MG 24 hr tablet  Has the patient contacted their pharmacy? Yes (Agent: If no, request that the patient contact the pharmacy for the refill. If patient does not wish to contact the pharmacy document the reason why and proceed with request.) (Agent: If yes, when and what did the pharmacy advise?) Pharmacy needs order to refill  This is the patient's preferred pharmacy:  [facility] - [place], [place] - [address] [place] [address] Phone: [phone] Fax: [phone]  Is this the correct pharmacy for this prescription? Yes If no, delete pharmacy and type the correct one.   Has the prescription been filled recently? No  Is the patient out of the medication? Yes She has been out for 3 days  Has the patient been seen for an appointment in the last year OR does the patient have an upcoming appointment? Yes  Can we respond through MyChart? Yes  Agent: Please be advised that Rx refills may take up to 3 business days. We ask that you follow-up with your pharmacy.

## 2024-03-13 NOTE — Telephone Encounter (Signed)
 Requested medication (s) are due for refill today:   Rosaline Bohr, NP to review  Requested medication (s) are on the active medication list:   Yes but from hospital admission in Feb. 2025.  Future visit scheduled:   Yes for 8/5.   Multiple No Shows and Cancellations.   Not sure this pt has ever seen [place].   Last ordered: 10/30/2023 #120, 2 refills from hospitalist  Returned for provider review.      Requested Prescriptions  Pending Prescriptions Disp Refills   NIFEdipine  (ADALAT  CC) 60 MG 24 hr tablet 120 tablet 2    Sig: Take 1 tablet (60 mg total) by mouth 2 (two) times daily.     Cardiovascular: Calcium  Channel Blockers 2 Failed - 03/13/2024 10:53 AM      Failed - Valid encounter within last 6 months    Recent Outpatient Visits           1 year ago Type 2 diabetes mellitus with other specified complication, unspecified whether long term insulin  use (HCC)   [place] [facility] Bohr Rosaline SQUIBB, NP   1 year ago Screening for STD (sexually transmitted disease)   [place] [facility]   1 year ago Type 2 diabetes mellitus with long term insulin    [place] [facility] Bohr Rosaline SQUIBB, NP   1 year ago Need for immunization against influenza    [facility]r Rosaline SQUIBB, NP   1 year ago Bumps on skin    [facility] Rosaline SQUIBB, NP              Passed - Last BP in normal range    BP Readings from Last 1 Encounters:  12/05/23 128/81         Passed - Last Heart Rate in normal range    Pulse Readings from Last 1 Encounters:  12/05/23 81

## 2024-04-02 ENCOUNTER — Other Ambulatory Visit (INDEPENDENT_AMBULATORY_CARE_PROVIDER_SITE_OTHER): Payer: Self-pay | Admitting: Primary Care

## 2024-04-02 NOTE — Telephone Encounter (Unsigned)
 Copied from CRM [id number]. Topic: Clinical - Medication Refill >> Apr 02, 2024 11:32 AM Janeecia G wrote: Medication: nifedipine   Has the patient contacted their pharmacy? Yes (Agent: If no, request that the patient contact the pharmacy for the refill. If patient does not wish to contact the pharmacy document the reason why and proceed with request.) (Agent: If yes, when and what did the pharmacy advise?)  This is the patient's preferred pharmacy:  [facility] - [place], [place] - [address] [place] [address] Phone: [phone] Fax: [phone]   Is this the correct pharmacy for this prescription? Yes If no, delete pharmacy and type the correct one.   Has the prescription been filled recently? No  Is the patient out of the medication? Yes  Has the patient been seen for an appointment in the last year OR does the patient have an upcoming appointment? Yes  Can we respond through MyChart? Yes  Agent: Please be advised that Rx refills may take up to 3 business days. We ask that you follow-up with your pharmacy.

## 2024-04-03 NOTE — Telephone Encounter (Signed)
 Requested medication (s) are due for refill today: yes  Requested medication (s) are on the active medication list: yes  Last refill:  10/30/23  #120 2 RF  Future visit scheduled: yes  Notes to clinic:  last prescriber was at hospital    Requested Prescriptions  Pending Prescriptions Disp Refills   NIFEdipine  (ADALAT  CC) 60 MG 24 hr tablet 120 tablet 2    Sig: Take 1 tablet (60 mg total) by mouth 2 (two) times daily.     Cardiovascular: Calcium  Channel Blockers 2 Failed - 04/03/2024 10:58 AM      Failed - Valid encounter within last 6 months    Recent Outpatient Visits           1 year ago Type 2 diabetes mellitus with other specified complication, unspecified whether long term insulin  use (HCC)   [place] [facility] [place] Rosaline SQUIBB, NP   1 year ago Screening for STD (sexually transmitted disease)   [place] [facility]   1 year ago Type 2 diabetes mellitus with long term insulin    [place] [facility] Celestia Rosaline SQUIBB, NP   1 year ago Need for immunization against influenza   [place] [facility] Celest[place] Rosaline SQUIBB, NP   1 year ago Bumps on skin   [place] [facility] Rosaline SQUIBB, NP              Passed - Last BP in normal range    BP Readings from Last 1 Encounters:  12/05/23 128/81         Passed - Last Heart Rate in normal range    Pulse Readings from Last 1 Encounters:  12/05/23 81

## 2024-04-07 ENCOUNTER — Encounter (INDEPENDENT_AMBULATORY_CARE_PROVIDER_SITE_OTHER): Admitting: Primary Care

## 2024-04-07 ENCOUNTER — Telehealth (INDEPENDENT_AMBULATORY_CARE_PROVIDER_SITE_OTHER): Payer: Self-pay | Admitting: Primary Care

## 2024-04-07 NOTE — Telephone Encounter (Signed)
 Patient did not attend their scheduled appointment. They have a documented history of missed appointments. Moving forward, the patient will be placed on a walk-in basis only. Please advise on any additional recommendations or concerns.

## 2024-04-25 ENCOUNTER — Inpatient Hospital Stay (HOSPITAL_COMMUNITY)

## 2024-04-25 ENCOUNTER — Encounter (HOSPITAL_COMMUNITY): Payer: Self-pay | Admitting: Obstetrics and Gynecology

## 2024-04-25 ENCOUNTER — Inpatient Hospital Stay (HOSPITAL_COMMUNITY)
Admission: AD | Admit: 2024-04-25 | Discharge: 2024-04-25 | Disposition: A | Discharge status: Home / Self Care | Attending: Obstetrics and Gynecology | Admitting: Obstetrics and Gynecology

## 2024-04-25 DIAGNOSIS — Z3A12 12 weeks gestation of pregnancy: Secondary | ICD-10-CM | POA: Diagnosis not present

## 2024-04-25 DIAGNOSIS — Z794 Long term (current) use of insulin: Secondary | ICD-10-CM | POA: Insufficient documentation

## 2024-04-25 DIAGNOSIS — B9689 Other specified bacterial agents as the cause of diseases classified elsewhere: Secondary | ICD-10-CM | POA: Diagnosis not present

## 2024-04-25 DIAGNOSIS — O98111 Syphilis complicating pregnancy, first trimester: Secondary | ICD-10-CM | POA: Diagnosis not present

## 2024-04-25 DIAGNOSIS — E1169 Type 2 diabetes mellitus with other specified complication: Secondary | ICD-10-CM

## 2024-04-25 DIAGNOSIS — Z79899 Other long term (current) drug therapy: Secondary | ICD-10-CM | POA: Diagnosis not present

## 2024-04-25 DIAGNOSIS — O30042 Twin pregnancy, dichorionic/diamniotic, second trimester: Secondary | ICD-10-CM

## 2024-04-25 DIAGNOSIS — O23591 Infection of other part of genital tract in pregnancy, first trimester: Secondary | ICD-10-CM | POA: Diagnosis not present

## 2024-04-25 DIAGNOSIS — O24112 Pre-existing diabetes mellitus, type 2, in pregnancy, second trimester: Secondary | ICD-10-CM

## 2024-04-25 DIAGNOSIS — O09291 Supervision of pregnancy with other poor reproductive or obstetric history, first trimester: Secondary | ICD-10-CM | POA: Diagnosis not present

## 2024-04-25 DIAGNOSIS — O10911 Unspecified pre-existing hypertension complicating pregnancy, first trimester: Secondary | ICD-10-CM | POA: Diagnosis not present

## 2024-04-25 DIAGNOSIS — O24111 Pre-existing diabetes mellitus, type 2, in pregnancy, first trimester: Secondary | ICD-10-CM | POA: Insufficient documentation

## 2024-04-25 DIAGNOSIS — Z3201 Encounter for pregnancy test, result positive: Secondary | ICD-10-CM | POA: Diagnosis present

## 2024-04-25 DIAGNOSIS — O30041 Twin pregnancy, dichorionic/diamniotic, first trimester: Secondary | ICD-10-CM | POA: Insufficient documentation

## 2024-04-25 DIAGNOSIS — E119 Type 2 diabetes mellitus without complications: Secondary | ICD-10-CM

## 2024-04-25 DIAGNOSIS — A515 Early syphilis, latent: Secondary | ICD-10-CM | POA: Diagnosis not present

## 2024-04-25 DIAGNOSIS — Z7982 Long term (current) use of aspirin: Secondary | ICD-10-CM | POA: Diagnosis not present

## 2024-04-25 DIAGNOSIS — O10919 Unspecified pre-existing hypertension complicating pregnancy, unspecified trimester: Secondary | ICD-10-CM

## 2024-04-25 DIAGNOSIS — O09899 Supervision of other high risk pregnancies, unspecified trimester: Secondary | ICD-10-CM

## 2024-04-25 DIAGNOSIS — I10 Essential (primary) hypertension: Secondary | ICD-10-CM

## 2024-04-25 DIAGNOSIS — O99210 Obesity complicating pregnancy, unspecified trimester: Secondary | ICD-10-CM

## 2024-04-25 DIAGNOSIS — N760 Acute vaginitis: Secondary | ICD-10-CM

## 2024-04-25 DIAGNOSIS — Z113 Encounter for screening for infections with a predominantly sexual mode of transmission: Secondary | ICD-10-CM

## 2024-04-25 DIAGNOSIS — R1030 Lower abdominal pain, unspecified: Secondary | ICD-10-CM | POA: Diagnosis present

## 2024-04-25 LAB — WET PREP, GENITAL
Sperm: NONE SEEN
Trich, Wet Prep: NONE SEEN
WBC, Wet Prep HPF POC: <10 (ref <10)
Yeast Wet Prep HPF POC: NONE SEEN

## 2024-04-25 LAB — URINALYSIS, ROUTINE W REFLEX MICROSCOPIC
Bilirubin Urine: NEGATIVE
Glucose, UA: >=500 mg/dL — AB
Hgb urine dipstick: NEGATIVE
Ketones, ur: NEGATIVE mg/dL
Leukocytes,Ua: NEGATIVE
Nitrite: NEGATIVE
Protein, ur: 30 mg/dL — AB
Specific Gravity, Urine: 1.027 (ref 1.005–1.030)
pH: 5 (ref 5.0–8.0)

## 2024-04-25 LAB — CBC WITH DIFFERENTIAL/PLATELET
Abs Immature Granulocytes: 0.02 K/uL (ref 0.00–0.07)
Basophils Absolute: 0 K/uL (ref 0.0–0.1)
Basophils Relative: 0 %
Eosinophils Absolute: 0.1 K/uL (ref 0.0–0.5)
Eosinophils Relative: 1 %
HCT: 34.4 % — ABNORMAL LOW (ref 36.0–46.0)
Hemoglobin: 11.8 g/dL — ABNORMAL LOW (ref 12.0–15.0)
Immature Granulocytes: 0 %
Lymphocytes Relative: 28 %
Lymphs Abs: 2.1 K/uL (ref 0.7–4.0)
MCH: 30.9 pg (ref 26.0–34.0)
MCHC: 34.3 g/dL (ref 30.0–36.0)
MCV: 90.1 fL (ref 80.0–100.0)
Monocytes Absolute: 0.5 K/uL (ref 0.1–1.0)
Monocytes Relative: 7 %
Neutro Abs: 4.9 K/uL (ref 1.7–7.7)
Neutrophils Relative %: 64 %
Platelets: 306 K/uL (ref 150–400)
RBC: 3.82 MIL/uL — ABNORMAL LOW (ref 3.87–5.11)
RDW: 12.7 % (ref 11.5–15.5)
WBC: 7.6 K/uL (ref 4.0–10.5)
nRBC: 0 % (ref 0.0–0.2)

## 2024-04-25 LAB — COMPREHENSIVE METABOLIC PANEL WITH GFR
ALT: 13 U/L (ref 0–44)
AST: 14 U/L — ABNORMAL LOW (ref 15–41)
Albumin: 3.4 g/dL — ABNORMAL LOW (ref 3.5–5.0)
Alkaline Phosphatase: 57 U/L (ref 38–126)
Anion gap: 10 (ref 5–15)
BUN: 8 mg/dL (ref 6–20)
CO2: 22 mmol/L (ref 22–32)
Calcium: 9.9 mg/dL (ref 8.9–10.3)
Chloride: 101 mmol/L (ref 98–111)
Creatinine, Ser: 0.7 mg/dL (ref 0.44–1.00)
GFR, Estimated: >60 mL/min (ref >60)
Glucose, Bld: 172 mg/dL — ABNORMAL HIGH (ref 70–99)
Potassium: 3.8 mmol/L (ref 3.5–5.1)
Sodium: 133 mmol/L — ABNORMAL LOW (ref 135–145)
Total Bilirubin: 0.2 mg/dL (ref 0.0–1.2)
Total Protein: 7.1 g/dL (ref 6.5–8.1)

## 2024-04-25 LAB — POCT PREGNANCY, URINE: Preg Test, Ur: POSITIVE — AB

## 2024-04-25 LAB — HIV ANTIBODY (ROUTINE TESTING W REFLEX): HIV Screen 4th Generation wRfx: NONREACTIVE

## 2024-04-25 LAB — HEMOGLOBIN A1C
Hgb A1c MFr Bld: 8.8 % — ABNORMAL HIGH (ref 4.8–5.6)
Mean Plasma Glucose: 205.86 mg/dL

## 2024-04-25 LAB — HCG, QUANTITATIVE, PREGNANCY: hCG, Beta Chain, Quant, S: 229837 m[IU]/mL — ABNORMAL HIGH (ref <5)

## 2024-04-25 MED ORDER — PRENATAL VITAMIN 27-0.8 MG PO TABS: 1.0000 | ORAL_TABLET | Freq: Every day | ORAL | 11 refills | Status: DC

## 2024-04-25 MED ORDER — BASAGLAR KWIKPEN 100 UNIT/ML ~~LOC~~ SOPN: 20.0000 [IU] | PEN_INJECTOR | Freq: Two times a day (BID) | SUBCUTANEOUS | 3 refills | Status: DC

## 2024-04-25 MED ORDER — ASPIRIN 81 MG PO TBEC: 81.0000 mg | DELAYED_RELEASE_TABLET | Freq: Every day | ORAL | 2 refills | Status: DC

## 2024-04-25 MED ORDER — ACETAMINOPHEN 500 MG PO TABS
1000.0000 mg | ORAL_TABLET | Freq: Once | ORAL | Status: AC
  Administered 2024-04-25: 1000 mg via ORAL
  Filled 2024-04-25: qty 2

## 2024-04-25 MED ORDER — NIFEDIPINE ER OSMOTIC RELEASE 30 MG PO TB24
60.0000 mg | ORAL_TABLET | Freq: Every day | ORAL | Status: DC
  Administered 2024-04-25: 60 mg via ORAL
  Filled 2024-04-25: qty 2

## 2024-04-25 MED ORDER — BLOOD PRESSURE MONITOR AUTOMAT DEVI: 1.0000 | Freq: Every day | 0 refills | Status: AC

## 2024-04-25 MED ORDER — METRONIDAZOLE 500 MG PO TABS: 500.0000 mg | ORAL_TABLET | Freq: Two times a day (BID) | ORAL | 0 refills | Status: DC

## 2024-04-25 MED ORDER — BLOOD GLUCOSE MONITORING SUPPL DEVI: 1.0000 | Freq: Three times a day (TID) | 0 refills | Status: AC

## 2024-04-25 MED ORDER — ACETAMINOPHEN 500 MG PO TABS: 1000.0000 mg | ORAL_TABLET | Freq: Four times a day (QID) | ORAL | 2 refills | Status: DC | PRN

## 2024-04-25 MED ORDER — NIFEDIPINE ER OSMOTIC RELEASE 30 MG PO TB24: 30.0000 mg | ORAL_TABLET | Freq: Every day | ORAL | Status: DC

## 2024-04-25 NOTE — MAU Provider Note (Addendum)
 History     CSN: [id number]  Arrival date and time: 04/25/24 1715   None     Chief Complaint  Patient presents with   Abdominal Pain   Possible Pregnancy   HPI  Misty [name] is a 33 y.o. female [phone] @ [facility] here in MAU with concerns of pregnancy. She is also reporting lower abdominal pain. She has no bleeding. Reports off and on cramping in her abdomen. Feels like period cramping.  Patient had a baby via primary C/s  in February 2025.   Medical history is complicated by type 2 DM and chronic hypertension.   OB History     Gravida  5   Para  2   Term  1   Preterm  1   AB  2   Living  2      SAB  2   IAB  0   Ectopic  0   Multiple  0   Live Births  2        Obstetric Comments  2022  BP was high 2025 c/s: fetal heart was dropping, BP was sky high         Past Medical History:  Diagnosis Date   Abscess 09/02/2020   Allergy    Anemia 2015   Asthma    as child   Chronic hypertension with superimposed preeclampsia 05/24/2021   Diabetes mellitus without complication (HCC) 2015   DKA (diabetic ketoacidosis) (HCC) 09/02/2020   Eczema    History of pre-eclampsia    Hypertension 09/2013   Trichomonas infection 04/2023   UTI (urinary tract infection)     Past Surgical History:  Procedure Laterality Date   Abcess on back     CESAREAN SECTION N/A 10/27/2023   Procedure: CESAREAN SECTION;  Surgeon: Zina Jerilynn LABOR, MD;  Location: [facility];  Service: Obstetrics;  Laterality: N/A;    Family History  Problem Relation Age of Onset   Migraines Mother    Hypertension Mother    Heart disease Mother 65       CHF   Diabetes Mother    Healthy Father    Sickle cell anemia Brother    Migraines Maternal Grandmother    Diabetes Maternal Grandmother    Heart disease Maternal Grandmother    Hypertension Maternal Grandfather    Diabetes Maternal Grandfather    Heart disease Maternal Grandfather    Asthma Neg Hx    Cancer Neg Hx      Social History   Tobacco Use   Smoking status: Former    Current packs/day: 0.00    Average packs/day: 0.5 packs/day for 2.0 years (1.0 ttl pk-yrs)    Types: Cigarettes    Start date: 09/09/2011    Quit date: 09/08/2013    Years since quitting: 10.6   Smokeless tobacco: Never  Vaping Use   Vaping status: Never Used  Substance Use Topics   Alcohol use: No    Alcohol/week: 0.0 standard drinks of alcohol   Drug use: No    Allergies:  Allergies  Allergen Reactions   Iodides Anaphylaxis and Itching   Morphine  And Codeine Anaphylaxis and Itching   Shellfish Allergy Anaphylaxis and Itching    Pt reports watery eyes   Ultram  [Tramadol  Hcl] Hives    Hives and swollen lips     Medications Prior to Admission  Medication Sig Dispense Refill Last Dose/Taking   [DISCONTINUED] Insulin  Glargine (BASAGLAR  KWIKPEN) 100 UNIT/ML INJECT 20 UNITS into THE SKIN 2  TIMES DAILY - IN THE MORNING AND BEFORE bed 15 mL 3 04/25/2024 Morning   ferrous sulfate  325 (65 FE) MG tablet Take 1 tablet (325 mg total) by mouth every other day. 30 tablet 3 More than a month   gabapentin  (NEURONTIN ) 100 MG capsule Take 2 capsules (200 mg total) by mouth 2 (two) times daily at 8 am and 10 pm. (Patient not taking: Reported on 12/05/2023) 30 capsule 2    ibuprofen  (ADVIL ) 800 MG tablet Take 1 tablet (800 mg total) by mouth 3 (three) times daily with meals as needed for headache, moderate pain (pain score 4-6) or cramping. (Patient not taking: Reported on 12/05/2023) 60 tablet 2    labetalol  (NORMODYNE ) 200 MG tablet Take 1 tablet (200 mg total) by mouth 3 (three) times daily. (Patient not taking: Reported on 11/26/2023) 90 tablet 1 Not Taking   meloxicam  (MOBIC ) 7.5 MG tablet Take 1 tablet (7.5 mg total) by mouth daily. 30 tablet 1    NIFEdipine  (ADALAT  CC) 60 MG 24 hr tablet Take 1 tablet (60 mg total) by mouth 2 (two) times daily. 120 tablet 2    oxyCODONE  (OXY IR/ROXICODONE ) 5 MG immediate release tablet Take 1 tablet (5  mg total) by mouth every 4 (four) hours as needed for severe pain (pain score 7-10) or breakthrough pain. (Patient not taking: Reported on 12/05/2023) 30 tablet 0    potassium chloride  SA (KLOR-CON  M) 20 MEQ tablet Take 1 tablet (20 mEq total) by mouth daily for 5 days. 5 tablet 0    senna-docusate (SENOKOT-S) 8.6-50 MG tablet Take 2 tablets by mouth at bedtime as needed for mild constipation or moderate constipation. (Patient not taking: Reported on 12/05/2023) 30 tablet 2    [DISCONTINUED] acetaminophen  (TYLENOL ) 500 MG tablet Take 2 tablets (1,000 mg total) by mouth every 6 (six) hours as needed for mild pain (pain score 1-3). 100 tablet 2 More than a month   [DISCONTINUED] Blood Glucose Monitoring Suppl DEVI 1 each by Does not apply route in the morning, at noon, and at bedtime. May substitute to any manufacturer covered by patient's insurance. (Patient not taking: Reported on 11/06/2023) 1 each 0    [DISCONTINUED] Blood Pressure Monitoring (BLOOD PRESSURE MONITOR AUTOMAT) DEVI 1 each by Does not apply route daily. (Patient not taking: Reported on 11/06/2023) 1 each 0    [DISCONTINUED] Prenatal Vit-Fe Fumarate-FA (PRENATAL VITAMIN) 27-0.8 MG TABS Take 1 tablet by mouth daily. 30 tablet 11    Results for orders placed or performed during the hospital encounter of 04/25/24 (from the past 48 hours)  Urinalysis, Routine w reflex microscopic -Urine, Clean Catch     Status: Abnormal   Collection Time: 04/25/24  5:55 PM  Result Value Ref Range   Color, Urine YELLOW YELLOW   APPearance HAZY (A) CLEAR   Specific Gravity, Urine 1.027 1.005 - 1.030   pH 5.0 5.0 - 8.0   Glucose, UA >=500 (A) NEGATIVE mg/dL   Hgb urine dipstick NEGATIVE NEGATIVE   Bilirubin Urine NEGATIVE NEGATIVE   Ketones, ur NEGATIVE NEGATIVE mg/dL   Protein, ur 30 (A) NEGATIVE mg/dL   Nitrite NEGATIVE NEGATIVE   Leukocytes,Ua NEGATIVE NEGATIVE   RBC / HPF 0-5 0 - 5 RBC/hpf   WBC, UA 0-5 0 - 5 WBC/hpf   Bacteria, UA RARE (A) NONE SEEN    Squamous Epithelial / HPF 0-5 0 - 5 /HPF   Mucus PRESENT     Comment: Performed at [facility], [address] [place], [place] [address]  Pregnancy, urine POC     Status: Abnormal   Collection Time: 04/25/24  5:56 PM  Result Value Ref Range   Preg Test, Ur POSITIVE (A) NEGATIVE    Comment:        THE SENSITIVITY OF THIS METHODOLOGY IS >20 mIU/mL.   Wet prep, genital     Status: Abnormal   Collection Time: 04/25/24  6:38 PM  Result Value Ref Range   Yeast Wet Prep HPF POC NONE SEEN NONE SEEN   Trich, Wet Prep NONE SEEN NONE SEEN   Clue Cells Wet Prep HPF POC PRESENT (A) NONE SEEN   WBC, Wet Prep HPF POC <10 <10   Sperm NONE SEEN     Comment: Performed at [facility], [address] [place], [place] [address]  ABO/Rh     Status: None   Collection Time: 04/25/24  7:03 PM  Result Value Ref Range   ABO/RH(D)      O POS Performed at [facility], [address], [place], [place] [address]   CBC with Differential/Platelet     Status: Abnormal   Collection Time: 04/25/24  7:07 PM  Result Value Ref Range   WBC 7.6 4.0 - 10.5 K/uL   RBC 3.82 (L) 3.87 - 5.11 MIL/uL   Hemoglobin 11.8 (L) 12.0 - 15.0 g/dL   HCT 65.5 (L) 63.9 - 53.9 %   MCV 90.1 80.0 - 100.0 fL   MCH 30.9 26.0 - 34.0 pg   MCHC 34.3 30.0 - 36.0 g/dL   RDW 87.2 88.4 - 84.4 %   Platelets 306 150 - 400 K/uL   nRBC 0.0 0.0 - 0.2 %   Neutrophils Relative % 64 %   Neutro Abs 4.9 1.7 - 7.7 K/uL   Lymphocytes Relative 28 %   Lymphs Abs 2.1 0.7 - 4.0 K/uL   Monocytes Relative 7 %   Monocytes Absolute 0.5 0.1 - 1.0 K/uL   Eosinophils Relative 1 %   Eosinophils Absolute 0.1 0.0 - 0.5 K/uL   Basophils Relative 0 %   Basophils Absolute 0.0 0.0 - 0.1 K/uL   Immature Granulocytes 0 %   Abs Immature Granulocytes 0.02 0.00 - 0.07 K/uL    Comment: Performed at [facility], [address], [place], [place] [address]  Comprehensive metabolic panel with GFR     Status: Abnormal   Collection Time:  04/25/24  7:07 PM  Result Value Ref Range   Sodium 133 (L) 135 - 145 mmol/L   Potassium 3.8 3.5 - 5.1 mmol/L   Chloride 101 98 - 111 mmol/L   CO2 22 22 - 32 mmol/L   Glucose, Bld 172 (H) 70 - 99 mg/dL    Comment: Glucose reference range applies only to samples taken after fasting for at least 8 hours.   BUN 8 6 - 20 mg/dL   Creatinine, Ser 9.29 0.44 - 1.00 mg/dL   Calcium  9.9 8.9 - 10.3 mg/dL   Total Protein 7.1 6.5 - 8.1 g/dL   Albumin 3.4 (L) 3.5 - 5.0 g/dL   AST 14 (L) 15 - 41 U/L   ALT 13 0 - 44 U/L   Alkaline Phosphatase 57 38 - 126 U/L   Total Bilirubin 0.2 0.0 - 1.2 mg/dL   GFR, Estimated >39 >39 mL/min    Comment: (NOTE) Calculated using the CKD-EPI Creatinine Equation (2021)    Anion gap 10 5 - 15  Comment: Performed at [facility], [address], [place], [place] [address]  hCG, quantitative, pregnancy     Status: Abnormal   Collection Time: 04/25/24  7:07 PM  Result Value Ref Range   hCG, Beta Chain, Quant, S [phone] (H) <5 mIU/mL    Comment:          GEST. AGE      CONC.  (mIU/mL)   <=1 WEEK        5 - 50     2 WEEKS       50 - 500     3 WEEKS       100 - 10,000     4 WEEKS     1,000 - 30,000     5 WEEKS     3,500 - 115,000   6-8 WEEKS     12,000 - 270,000    12 WEEKS     15,000 - 220,000        FEMALE AND NON-PREGNANT FEMALE:     LESS THAN 5 mIU/mL Performed at [facility], [address] [place], [place] [address]     US  MAINE Comp Less 14 Wks Result Date: 04/25/2024 CLINICAL DATA:  Generalized abdominal cramping. EXAM: TWIN OBSTETRICAL ULTRASOUND <14 WKS TECHNIQUE: Transabdominal and transvaginal ultrasound was performed for evaluation of the gestation as well as the maternal uterus and adnexal regions. COMPARISON:  None Available. FINDINGS: Number of IUPs:  2 Chorionicity/Amnionicity:  Dichorionic-diamniotic (thick membrane) TWIN 1 Yolk sac:  Not Visualized. Embryo:  Visualized. Cardiac Activity: Visualized. Heart Rate: 157 bpm CRL:   54.8 mm    12 w 1 d                  US  EDC: November 06, 2024 TWIN 2 Yolk sac:  Visualized. Embryo:  Visualized. Cardiac Activity: Visualized. Heart Rate: 164 bpm CRL:   52.0 mm   11 w 6 d                  US  EDC: November 08, 2024 Subchorionic hemorrhage:  None visualized. Maternal uterus/adnexae: The bilateral ovaries are visualized and are normal in appearance. The cervix is closed. No pelvic free fluid is seen. IMPRESSION: Viable intrauterine twin pregnancy at approximately 12 weeks and 1 day gestation for Twin 1 and 11 weeks and 6 days gestation for Twin 2, by ultrasound evaluation. Electronically Signed   By: Suzen Dials M.D.   On: 04/25/2024 19:46   US  OB Comp AddL Gest Less 14 Wks Result Date: 04/25/2024 CLINICAL DATA:  Generalized abdominal cramping. EXAM: TWIN OBSTETRICAL ULTRASOUND <14 WKS TECHNIQUE: Transabdominal and transvaginal ultrasound was performed for evaluation of the gestation as well as the maternal uterus and adnexal regions. COMPARISON:  None Available. FINDINGS: Number of IUPs:  2 Chorionicity/Amnionicity:  Dichorionic-diamniotic (thick membrane) TWIN 1 Yolk sac:  Not Visualized. Embryo:  Visualized. Cardiac Activity: Visualized. Heart Rate: 157 bpm CRL:   54.8 mm   12 w 1 d                  US  EDC: November 06, 2024 TWIN 2 Yolk sac:  Visualized. Embryo:  Visualized. Cardiac Activity: Visualized. Heart Rate: 164 bpm CRL:   52.0 mm   11 w 6 d                  US  EDC: November 08, 2024 Subchorionic hemorrhage:  None visualized. Maternal uterus/adnexae: The bilateral ovaries are visualized and  are normal in appearance. The cervix is closed. No pelvic free fluid is seen. IMPRESSION: Viable intrauterine twin pregnancy at approximately 12 weeks and 1 day gestation for Twin 1 and 11 weeks and 6 days gestation for Twin 2, by ultrasound evaluation. Electronically Signed   By: Suzen Dials M.D.   On: 04/25/2024 19:46    Review of Systems  Constitutional:  Negative for fever.  Gastrointestinal:  Positive for  abdominal pain and nausea. Negative for vomiting.  Genitourinary:  Negative for vaginal bleeding and vaginal discharge.   Physical Exam   Blood pressure (!) 159/83, pulse 73, temperature 98.3 F (36.8 C), temperature source Oral, resp. rate 17, height 5' 3 (1.6 m), weight 98.2 kg, SpO2 100%, not currently breastfeeding.  Physical Exam Vitals and nursing note reviewed.  Constitutional:      General: She is not in acute distress.    Appearance: She is well-developed. She is obese. She is not ill-appearing.  HENT:     Head: Normocephalic and atraumatic.     Mouth/Throat:     Mouth: Mucous membranes are moist.     Pharynx: Oropharynx is clear.  Eyes:     Extraocular Movements: Extraocular movements intact.  Cardiovascular:     Rate and Rhythm: Normal rate.  Pulmonary:     Effort: Pulmonary effort is normal. No respiratory distress.  Abdominal:     Palpations: Abdomen is soft.     Tenderness: There is no abdominal tenderness.     Comments: gravid  Skin:    General: Skin is warm and dry.  Neurological:     Mental Status: She is alert and oriented to person, place, and time.     Motor: No weakness.  Psychiatric:        Mood and Affect: Mood normal.        Behavior: Behavior normal.     MAU Course  Procedures  MDM  Results pending Patient handed over to Dr. Jhonny for further management.  Rasch, Delon FERNS, NP  Assumed care.  Spoke with patient and has not been taking Procardia  60mg  BID as prescribed.  Has only been taking Basaglar  18U BID instead of 20U prescribed.  Has HA and would like Tylenol .  Will give Procardia  and Tylenol .  Will send refills as well as ASA, referral to MFM.  Pt understanding of high risk given CHTN, hx of PreE, IDT2DM, Short Interval Pregnancy, DiDi Twin Gestation. Pt thinking tubal after delivery of twins.  Pt stable for d/c.  Message sent to [facility] for close follow up.    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

## 2024-04-25 NOTE — Discharge Instructions (Signed)
 [facility] [employer] for [employer] at [employer] [facility]             [address] [place], [place] [address] [phone]  Center for [employer] at [facility]                                                             [address], [place], [place], [address] [phone]  Center for [facility] at [facility] [address], [address], [place], [place], [address] [phone]  Center for [facility] at [facility] [address], [place], [place], [address] [phone]  Center for [facility] at [facility]                                 [address] [place], [place], [place], [address] [phone]  Center for [facility] at [facility]                                    [address] [place], [place], [address] [phone]  Center for [facility] Healthcare at [facility] [address], [place], [place], [address]                              [facility] [address], [place], [place], [address] [phone]  [facility]         Phone: [phone]  [facility]      Phone: [phone]   [facility]      Phone: [phone]  [facility]         Phone: [phone]   [facility]    Phone: [phone]  [facility]      Phone: [phone]  [facility] [place]     Phone: [phone]  [facility]        Phone: [phone]  [facility] [place]) [phone]  [facility]      Phone: [phone]                        Safe Medications in Pregnancy    Acne: Benzoyl Peroxide Salicylic Acid  Backache/Headache: Tylenol: 2 regular strength every 4 hours OR               2 Extra strength every 6 hours  Colds/Coughs/Allergies: Benadryl (alcohol free) 25 mg every 6 hours as needed Breath right strips Claritin Cepacol throat lozenges Chloraseptic throat spray Cold-Eeze- up to three times per day Cough drops, alcohol free Flonase (by prescription only) Guaifenesin Mucinex Robitussin DM (plain only, alcohol free) Saline nasal spray/drops Sudafed (pseudoephedrine) & Actifed ** use only after [redacted] weeks gestation and if you do not have high blood pressure Tylenol Vicks Vaporub  Zinc lozenges Zyrtec   Constipation: Colace Ducolax suppositories Fleet enema Glycerin suppositories Metamucil Milk of magnesia Miralax Senokot Smooth move tea  Diarrhea: Kaopectate Imodium A-D  *NO pepto Bismol  Hemorrhoids: Anusol Anusol HC Preparation H Tucks  Indigestion: Tums Maalox Mylanta Zantac  Pepcid  Insomnia: Benadryl (alcohol free) 25mg  every 6 hours as needed Tylenol PM Unisom, no Gelcaps  Leg Cramps: Tums MagGel  Nausea/Vomiting:  Bonine Dramamine Emetrol Ginger extract Sea bands Meclizine  Nausea medication to take during pregnancy:  Unisom (doxylamine succinate 25 mg tablets) Take one tablet daily at bedtime. If symptoms are not adequately controlled, the dose can be increased to a maximum recommended dose of two tablets daily (1/2 tablet in the morning, 1/2 tablet mid-afternoon and one at bedtime). Vitamin B6 100mg  tablets. Take one tablet twice a day (up to 200 mg per day).  Skin Rashes: Aveeno products Benadryl cream or 25mg  every 6 hours as needed Calamine Lotion 1% cortisone cream  Yeast infection: Gyne-lotrimin 7 Monistat 7   **If taking multiple medications, please check labels to avoid duplicating the same active ingredients **take medication as directed on the label ** Do not exceed 4000 mg of tylenol in 24 hours **Do not take medications that contain aspirin or ibuprofen

## 2024-04-25 NOTE — MAU Note (Signed)
 Misty [name] is a 32 y.o. at [facility] here in MAU reporting: having abd pain, on the side - cramping pain like a period. +UPT last wk. No bleeding.  LMP: ? Upon questioning further, bled after delivery in Feb and never had a period. Onset of complaint: today Pain score: mild Vitals:   04/25/24 1732  BP: (!) 157/91  Pulse: 91  Resp: 17  Temp: 98.3 F (36.8 C)  SpO2: 100%     QYU:juuzfeuzi to listen Lab orders placed from triage:  UPT, UA

## 2024-04-26 LAB — ABO/RH: ABO/RH(D): O POS

## 2024-04-26 LAB — RPR
RPR Ser Ql: REACTIVE — AB
RPR Titer: 1:1 {titer}

## 2024-04-27 ENCOUNTER — Telehealth: Payer: Self-pay | Admitting: Family Medicine

## 2024-04-27 ENCOUNTER — Other Ambulatory Visit: Payer: Self-pay | Admitting: Family Medicine

## 2024-04-27 DIAGNOSIS — Z113 Encounter for screening for infections with a predominantly sexual mode of transmission: Secondary | ICD-10-CM

## 2024-04-27 LAB — GC/CHLAMYDIA PROBE AMP (~~LOC~~) NOT AT ARMC
Chlamydia: NEGATIVE
Comment: NEGATIVE
Comment: NORMAL
Neisseria Gonorrhea: NEGATIVE

## 2024-04-27 NOTE — Telephone Encounter (Signed)
 Patient is calling to schedule her prenatal care. She says when she was seen in the MAU her bp was 100/100 each time they took it and she is currently out of her bp medication. She wants to get it refilled so she can pick it up at the same time as her other medicine.

## 2024-04-28 ENCOUNTER — Encounter (INDEPENDENT_AMBULATORY_CARE_PROVIDER_SITE_OTHER): Admitting: Primary Care

## 2024-04-28 LAB — T.PALLIDUM AB, TOTAL: T Pallidum Abs: REACTIVE — AB

## 2024-04-29 ENCOUNTER — Other Ambulatory Visit: Payer: Self-pay | Admitting: *Deleted

## 2024-04-29 DIAGNOSIS — O30042 Twin pregnancy, dichorionic/diamniotic, second trimester: Secondary | ICD-10-CM

## 2024-04-29 DIAGNOSIS — I10 Essential (primary) hypertension: Secondary | ICD-10-CM

## 2024-04-29 DIAGNOSIS — O10919 Unspecified pre-existing hypertension complicating pregnancy, unspecified trimester: Secondary | ICD-10-CM

## 2024-04-29 MED ORDER — NIFEDIPINE ER 60 MG PO TB24: 60.0000 mg | ORAL_TABLET | Freq: Two times a day (BID) | ORAL | 2 refills | Status: DC

## 2024-04-29 NOTE — Telephone Encounter (Signed)
 Per chart review plan was to refill Nifedipine  but no refill was sent in. Discussed with Dr. Eldonna and refill sent in. I called Kyung and notified her. Rock Skip PEAK

## 2024-04-30 ENCOUNTER — Telehealth

## 2024-04-30 DIAGNOSIS — O99211 Obesity complicating pregnancy, first trimester: Secondary | ICD-10-CM

## 2024-04-30 DIAGNOSIS — O30049 Twin pregnancy, dichorionic/diamniotic, unspecified trimester: Secondary | ICD-10-CM | POA: Insufficient documentation

## 2024-04-30 DIAGNOSIS — E669 Obesity, unspecified: Secondary | ICD-10-CM

## 2024-04-30 DIAGNOSIS — E1169 Type 2 diabetes mellitus with other specified complication: Secondary | ICD-10-CM

## 2024-04-30 DIAGNOSIS — A515 Early syphilis, latent: Secondary | ICD-10-CM

## 2024-04-30 DIAGNOSIS — O30041 Twin pregnancy, dichorionic/diamniotic, first trimester: Secondary | ICD-10-CM | POA: Diagnosis not present

## 2024-04-30 DIAGNOSIS — Z8759 Personal history of other complications of pregnancy, childbirth and the puerperium: Secondary | ICD-10-CM

## 2024-04-30 DIAGNOSIS — O0990 Supervision of high risk pregnancy, unspecified, unspecified trimester: Secondary | ICD-10-CM | POA: Insufficient documentation

## 2024-04-30 DIAGNOSIS — O0991 Supervision of high risk pregnancy, unspecified, first trimester: Secondary | ICD-10-CM

## 2024-04-30 DIAGNOSIS — O99210 Obesity complicating pregnancy, unspecified trimester: Secondary | ICD-10-CM

## 2024-04-30 DIAGNOSIS — I10 Essential (primary) hypertension: Secondary | ICD-10-CM

## 2024-04-30 DIAGNOSIS — Z98891 History of uterine scar from previous surgery: Secondary | ICD-10-CM

## 2024-04-30 DIAGNOSIS — Z8751 Personal history of pre-term labor: Secondary | ICD-10-CM

## 2024-04-30 DIAGNOSIS — Z3A12 12 weeks gestation of pregnancy: Secondary | ICD-10-CM

## 2024-04-30 DIAGNOSIS — O98111 Syphilis complicating pregnancy, first trimester: Secondary | ICD-10-CM

## 2024-04-30 MED ORDER — GOJJI WEIGHT SCALE MISC
1.0000 | Freq: Once | 0 refills | Status: AC
Start: 2024-04-30 — End: 2024-04-30

## 2024-04-30 NOTE — Progress Notes (Signed)
 New OB Intake  I connected with Misty [name]  on 04/30/24 at  1:15 PM EDT by MyChart Video Visit and verified that I am speaking with the correct person using two identifiers. Nurse is located at [facility] and pt is located at [employer] in a private room.  I discussed the limitations, risks, security and privacy concerns of performing an evaluation and management service by telephone and the availability of in person appointments. I also discussed with the patient that there may be a patient responsible charge related to this service. The patient expressed understanding and agreed to proceed.  I explained I am completing New OB Intake today. We discussed EDD of 11/06/2024 based on US  at 12.1 weeks. Pt is [id number]. I reviewed her allergies, medications and Medical/Surgical/OB history.    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 Discussed the nature of our practice with multiple providers including residents and students as well as female and female providers. Due to the size of the practice, the delivering provider may not be the same as those providing prenatal care.   Patient is not a candidate for water birth. / /MyChart/Babyscripts MyChart access verified. I explained pt will have some visits in office and some virtually. Babyscripts instructions given and order placed.  Blood Pressure Cuff/Weight  Scale Blood Pressure Cuff previously ordered at [facility] per patient request due to patient stating insurance denial at [employer]. Explained after first prenatal appt pt will check weekly and document in Babyscripts. Patient Weight Scale ordered at [facility] per patient request due to patient stating insurance denial at [employer] .  Anatomy US  Explained first scheduled US  will be around 19 weeks. Anatomy US  scheduled by MFM; patient notified.  Is patient a CenteringPregnancy candidate?  Not a candidate due to di-di twins, HTN, diabetes   Is patient a Mom+Baby Combined Care candidate?  Not a candidate    Is patient a candidate for Babyscripts Optimization? No, due to HTN, diabetes, twins   First visit review I reviewed new OB appt with patient. Explained pt will be seen by Misty [name] at first visit. Discussed Misty [name] genetic screening with patient. Patient would like Panorama testing with Routine prenatal labs at [facility] OB visit.   Last Pap Diagnosis  Date Value Ref Range Status  02/15/2021   Final   - Negative for intraepithelial lesion or malignancy (NILM)    Misty Ash, RN 04/30/2024  4:07 PM

## 2024-05-05 ENCOUNTER — Ambulatory Visit (INDEPENDENT_AMBULATORY_CARE_PROVIDER_SITE_OTHER): Admitting: Obstetrics and Gynecology

## 2024-05-05 ENCOUNTER — Other Ambulatory Visit (HOSPITAL_COMMUNITY)
Admission: RE | Admit: 2024-05-05 | Discharge: 2024-05-05 | Disposition: A | Discharge status: Home / Self Care | Source: Ambulatory Visit | Attending: Obstetrics and Gynecology | Admitting: Obstetrics and Gynecology

## 2024-05-05 ENCOUNTER — Other Ambulatory Visit: Payer: Self-pay

## 2024-05-05 ENCOUNTER — Encounter: Payer: Self-pay | Admitting: Obstetrics and Gynecology

## 2024-05-05 VITALS — BP 105/81 | HR 99 | Wt 221.0 lb

## 2024-05-05 DIAGNOSIS — Z8751 Personal history of pre-term labor: Secondary | ICD-10-CM

## 2024-05-05 DIAGNOSIS — Z8759 Personal history of other complications of pregnancy, childbirth and the puerperium: Secondary | ICD-10-CM | POA: Diagnosis not present

## 2024-05-05 DIAGNOSIS — O0990 Supervision of high risk pregnancy, unspecified, unspecified trimester: Secondary | ICD-10-CM

## 2024-05-05 DIAGNOSIS — Z98891 History of uterine scar from previous surgery: Secondary | ICD-10-CM

## 2024-05-05 DIAGNOSIS — O0991 Supervision of high risk pregnancy, unspecified, first trimester: Secondary | ICD-10-CM | POA: Diagnosis not present

## 2024-05-05 DIAGNOSIS — O09891 Supervision of other high risk pregnancies, first trimester: Secondary | ICD-10-CM | POA: Diagnosis not present

## 2024-05-05 DIAGNOSIS — O30049 Twin pregnancy, dichorionic/diamniotic, unspecified trimester: Secondary | ICD-10-CM

## 2024-05-05 DIAGNOSIS — O10911 Unspecified pre-existing hypertension complicating pregnancy, first trimester: Secondary | ICD-10-CM | POA: Diagnosis not present

## 2024-05-05 DIAGNOSIS — Z8619 Personal history of other infectious and parasitic diseases: Secondary | ICD-10-CM | POA: Diagnosis not present

## 2024-05-05 DIAGNOSIS — Z1332 Encounter for screening for maternal depression: Secondary | ICD-10-CM

## 2024-05-05 DIAGNOSIS — Z3A13 13 weeks gestation of pregnancy: Secondary | ICD-10-CM | POA: Diagnosis not present

## 2024-05-05 DIAGNOSIS — E1169 Type 2 diabetes mellitus with other specified complication: Secondary | ICD-10-CM

## 2024-05-05 DIAGNOSIS — O10919 Unspecified pre-existing hypertension complicating pregnancy, unspecified trimester: Secondary | ICD-10-CM

## 2024-05-05 DIAGNOSIS — O30041 Twin pregnancy, dichorionic/diamniotic, first trimester: Secondary | ICD-10-CM | POA: Diagnosis not present

## 2024-05-05 DIAGNOSIS — O09899 Supervision of other high risk pregnancies, unspecified trimester: Secondary | ICD-10-CM | POA: Insufficient documentation

## 2024-05-05 MED ORDER — DEXCOM G7 SENSOR MISC
7 refills | Status: AC
Start: 2024-05-05 — End: ?

## 2024-05-05 MED ORDER — NIFEDIPINE ER 60 MG PO TB24: 60.0000 mg | ORAL_TABLET | Freq: Every day | ORAL | 2 refills | Status: DC

## 2024-05-05 MED ORDER — ASPIRIN 81 MG PO TBEC: 162.0000 mg | DELAYED_RELEASE_TABLET | Freq: Every day | ORAL | 2 refills | Status: DC

## 2024-05-05 NOTE — Progress Notes (Signed)
 INITIAL PRENATAL VISIT  Subjective:   Misty [name] is being seen today for her first obstetrical visit.    She is at [redacted]w[redacted]d gestation by ultrasound Her obstetrical history is significant for hx of cesarean section, T2DM on insulin , CHTN on nifedipine . Patient does not intend to breast feed. Pregnancy history fully reviewed.  Patient reports no complaints.  Indications for ASA therapy (per uptodate) One of the following: Previous pregnancy with preeclampsia, especially early onset and with an adverse outcome Yes Multifetal gestation No Chronic hypertension No Type 1 or 2 diabetes mellitus No Chronic kidney disease No Autoimmune disease (antiphospholipid syndrome, systemic lupus erythematosus) No   Objective:    Obstetric History OB History  Gravida Para Term Preterm AB Living  4 2 1 1 1 2   SAB IAB Ectopic Multiple Live Births  1 0 0 0 2    # Outcome Date GA Lbr Len/2nd Weight Sex Type Anes PTL Lv  4 Current           3 Term 10/27/23 [redacted]w[redacted]d  5 lb 13.1 oz (2.64 kg) F CS-LTranv EPI  LIV  2 SAB 09/05/22          1 Preterm 06/22/21 [redacted]w[redacted]d 14:20 / 00:53 4 lb 10.1 oz (2.1 kg) F Vag-Spont EPI  LIV    Obstetric Comments  2022  BP was high  2025 c/s: fetal heart was dropping, BP was sky high    Past Medical History:  Diagnosis Date   Abscess 09/02/2020   Allergy    Anemia 2015   Asthma    as child   Chronic hypertension with superimposed preeclampsia 05/24/2021   Diabetes mellitus without complication (HCC) 2015   DKA (diabetic ketoacidosis) (HCC) 09/02/2020   Eczema    History of pre-eclampsia    Hypertension 09/2013   Trichomonas infection 04/2023   UTI (urinary tract infection)     Past Surgical History:  Procedure Laterality Date   Abcess on back     CESAREAN SECTION N/A 10/27/2023   Procedure: CESAREAN SECTION;  Surgeon: Zina Jerilynn LABOR, MD;  Location: [facility];  Service: Obstetrics;  Laterality: N/A;    Current Outpatient Medications on File Prior to  Visit  Medication Sig Dispense Refill   acetaminophen  (TYLENOL ) 500 MG tablet Take 2 tablets (1,000 mg total) by mouth every 6 (six) hours as needed for mild pain (pain score 1-3). 100 tablet 2   Blood Glucose Monitoring Suppl DEVI 1 each by Does not apply route in the morning, at noon, and at bedtime. May substitute to any manufacturer covered by patient's insurance. 1 each 0   ferrous sulfate  325 (65 FE) MG tablet Take 1 tablet (325 mg total) by mouth every other day. 30 tablet 3   Insulin  Glargine (BASAGLAR  KWIKPEN) 100 UNIT/ML Inject 20 Units into the skin 2 (two) times daily. 15 mL 3   metroNIDAZOLE  (FLAGYL ) 500 MG tablet Take 1 tablet (500 mg total) by mouth 2 (two) times daily. 14 tablet 0   Prenatal Vit-Fe Fumarate-FA (WESTAB PLUS) 27-1 MG TABS TAKE 1 TABLET BY MOUTH DAILY 30 tablet 11   Blood Pressure Monitoring (BLOOD PRESSURE MONITOR AUTOMAT) DEVI 1 each by Does not apply route daily. (Patient not taking: Reported on 05/05/2024) 1 each 0   No current facility-administered medications on file prior to visit.    Allergies  Allergen Reactions   Iodides Anaphylaxis and Itching   Morphine  And Codeine Anaphylaxis and Itching   Shellfish Allergy Anaphylaxis and Itching  Pt reports watery eyes   Ultram  [Tramadol  Hcl] Hives    Hives and swollen lips     Social History:  reports that she quit smoking about 10 years ago. Her smoking use included cigarettes. She started smoking about 12 years ago. She has a 1 pack-year smoking history. She has never used smokeless tobacco. She reports that she does not drink alcohol and does not use drugs.  Family History  Problem Relation Age of Onset   Migraines Mother    Hypertension Mother    Heart disease Mother 23       CHF   Diabetes Mother    Healthy Father    Sickle cell anemia Brother    Migraines Maternal Grandmother    Diabetes Maternal Grandmother    Heart disease Maternal Grandmother    Hypertension Maternal Grandfather     Diabetes Maternal Grandfather    Heart disease Maternal Grandfather    Asthma Neg Hx    Cancer Neg Hx     The following portions of the patient's history were reviewed and updated as appropriate: allergies, current medications, past family history, past medical history, past social history, past surgical history and problem list.  Review of Systems Review of Systems  All other systems reviewed and are negative.     Physical Exam:  BP 105/81   Pulse 99   Wt 221 lb (100.2 kg)   LMP  (LMP Unknown) Comment: no period since delivery  BMI 39.15 kg/m  CONSTITUTIONAL: Well-developed, well-nourished female in no acute distress.  HENT:  Normocephalic, atraumatic.   NECK: Normal range of motion SKIN: Skin is warm and dry. MUSCULOSKELETAL: Normal range of motion NEUROLOGIC: Alert and oriented  PSYCHIATRIC: Normal mood and affect. Normal behavior.  RESPIRATORY: normal effort ABDOMEN: Soft PELVIC:deferred  Fetal Heart Rate (bpm): 148   Movement: Absent       Assessment:    Pregnancy: [id number]  1. Supervision of high risk pregnancy, antepartum (Primary) BP and FHR normal  - PANORAMA PRENATAL TEST - CBC/D/Plt+RPR+Rh+ABO+RubIgG... - Culture, OB Urine - GC/Chlamydia probe amp ([place])not at [facility]  2. History of cesarean section 10/27/23 d/t NRFHR  3. History of preterm delivery At 34 weeks first pregnancy d/t HTN  4. History of severe pre-eclampsia 5. Chronic hypertension affecting pregnancy Discussed recommendation 162mg  ASA in pregnancy Baseline labs today Not taking bp meds scheduled daily, Nifedipine  60mg  daily, f/u if > 140s/90s Discussed rec fetal echo  - aspirin  EC 81 MG tablet; Take 2 tablets (162 mg total) by mouth daily. Take after 12 weeks for prevention of preeclampsia later in pregnancy  Dispense: 300 tablet; Refill: 2 - Comp Met (CMET) - Protein / creatinine ratio, urine - NIFEdipine  (ADALAT  CC) 60 MG 24 hr tablet; Take 1 tablet (60 mg total) by mouth daily.   Dispense: 120 tablet; Refill: 2  6. Dichorionic diamniotic twin pregnancy, antepartum   7. Type 2 diabetes mellitus with other specified complication, unspecified whether long term insulin  use (HCC) Seen in [facility] 8/23, rx 20u Basaglar  BID  Discussed checking sugars QID, desires dexcom  she had previous pregnancy, rx placed  Discussed importance of glucose control, bring log to clinic, will follow up sooner to evaluate sugars with increased insulin  dose Discussed scheduling eye exam  - US  Fetal Echocardiography; Future - Continuous Glucose Sensor (DEXCOM G7 SENSOR) MISC; Place one monitor onto the skin. Change every ten days  Dispense: 3 each; Refill: 7   8. Short interval between pregnancies affecting pregnancy, antepartum Delivered  feb/ 2025  9. 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 Prenatal vitamins. Problem list reviewed and updated. Reviewed in detail the nature of the practice with collaborative care between  Genetic screening discussed: NIPS/First trimester screen/Quad/AFP undecided. Role of ultrasound in pregnancy discussed; Anatomy US : ordered. Discussed clinic routines, schedule of care and testing, genetic screening options, involvement of students and residents under the direct supervision of APPs and doctors and presence of female providers. Pt verbalized understanding.   Delores Nidia CROME, FNP

## 2024-05-06 LAB — CBC/D/PLT+RPR+RH+ABO+RUBIGG...
Antibody Screen: NEGATIVE
Basophils Absolute: 0 x10E3/uL (ref 0.0–0.2)
Basos: 0 %
EOS (ABSOLUTE): 0 x10E3/uL (ref 0.0–0.4)
Eos: 1 %
HCV Ab: NONREACTIVE
HIV Screen 4th Generation wRfx: NONREACTIVE
Hematocrit: 38.9 % (ref 34.0–46.6)
Hemoglobin: 12.8 g/dL (ref 11.1–15.9)
Hepatitis B Surface Ag: NEGATIVE
Immature Grans (Abs): 0 x10E3/uL (ref 0.0–0.1)
Immature Granulocytes: 0 %
Lymphocytes Absolute: 1.7 x10E3/uL (ref 0.7–3.1)
Lymphs: 26 %
MCH: 31.1 pg (ref 26.6–33.0)
MCHC: 32.9 g/dL (ref 31.5–35.7)
MCV: 94 fL (ref 79–97)
Monocytes Absolute: 0.4 x10E3/uL (ref 0.1–0.9)
Monocytes: 7 %
Neutrophils Absolute: 4.1 x10E3/uL (ref 1.4–7.0)
Neutrophils: 66 %
Platelets: 360 x10E3/uL (ref 150–450)
RBC: 4.12 x10E6/uL (ref 3.77–5.28)
RDW: 13.3 % (ref 11.7–15.4)
RPR Ser Ql: REACTIVE — AB
Rh Factor: POSITIVE
Rubella Antibodies, IGG: <0.9 {index} — ABNORMAL LOW (ref >0.99)
WBC: 6.3 x10E3/uL (ref 3.4–10.8)

## 2024-05-06 LAB — COMPREHENSIVE METABOLIC PANEL WITH GFR
ALT: 13 IU/L (ref 0–32)
AST: 16 IU/L (ref 0–40)
Albumin: 4.5 g/dL (ref 3.9–4.9)
Alkaline Phosphatase: 74 IU/L (ref 44–121)
BUN/Creatinine Ratio: 12 (ref 9–23)
BUN: 10 mg/dL (ref 6–20)
Bilirubin Total: 0.3 mg/dL (ref 0.0–1.2)
CO2: 19 mmol/L — ABNORMAL LOW (ref 20–29)
Calcium: 10.3 mg/dL — ABNORMAL HIGH (ref 8.7–10.2)
Chloride: 98 mmol/L (ref 96–106)
Creatinine, Ser: 0.85 mg/dL (ref 0.57–1.00)
Globulin, Total: 2.8 g/dL (ref 1.5–4.5)
Glucose: 171 mg/dL — ABNORMAL HIGH (ref 70–99)
Potassium: 4.7 mmol/L (ref 3.5–5.2)
Sodium: 132 mmol/L — ABNORMAL LOW (ref 134–144)
Total Protein: 7.3 g/dL (ref 6.0–8.5)
eGFR: 93 mL/min/1.73 (ref >59)

## 2024-05-06 LAB — RPR, QUANT+TP ABS (REFLEX)
Rapid Plasma Reagin, Quant: 1:2 {titer} — ABNORMAL HIGH
T Pallidum Abs: REACTIVE — AB

## 2024-05-06 LAB — HCV INTERPRETATION

## 2024-05-07 ENCOUNTER — Ambulatory Visit: Payer: Self-pay | Admitting: Obstetrics and Gynecology

## 2024-05-07 DIAGNOSIS — O09899 Supervision of other high risk pregnancies, unspecified trimester: Secondary | ICD-10-CM | POA: Insufficient documentation

## 2024-05-07 LAB — GC/CHLAMYDIA PROBE AMP (~~LOC~~) NOT AT ARMC
Chlamydia: NEGATIVE
Comment: NEGATIVE
Comment: NORMAL
Neisseria Gonorrhea: NEGATIVE

## 2024-05-08 ENCOUNTER — Telehealth: Payer: Self-pay | Admitting: Lactation Services

## 2024-05-08 NOTE — Telephone Encounter (Signed)
 Hallelujah Trautman (Key: [id number]) PA Case ID #: [id number] Rx #: [id number] Need Help? Call us  at [phone] Status sent iconSent to Plan today Drug Dexcom G7 Sensor ePA cloud logo Form [facility] Electronic Prior Authorization Form [phone] NCPDP) Original Claim Info 29

## 2024-05-08 NOTE — Telephone Encounter (Signed)
 Misty [name] (Key: [id number]) PA Case ID #: [id number] Rx #: [id number] Need Help? Call us  at [phone] Outcome Approved today by [facility] 2017 NCPDP Request Reference Number: [id number]. DEXCOM G7 MIS SENSOR is approved through 05/08/2025. Your patient may now fill this prescription and it will be covered. Effective Date: 05/08/2024 Authorization Expiration Date: 05/08/2025 Drug Dexcom G7 Sensor ePA cloud logo Form [facility] Electronic Prior Authorization Form [phone] NCPDP) Original Claim Info 40  Called Pharmacy to let them know PA approved. Spoke with Latoya to let her know Dexcom approved. Prescription went through.   Called patient to let her know PA approved and can pick up today. She used with last pregnancy and is aware of how to apply and to change every 10 days. She has no questions or concerns at this time.

## 2024-05-11 LAB — PANORAMA PRENATAL TEST FULL PANEL:PANORAMA TEST PLUS 5 ADDITIONAL MICRODELETIONS
FETAL FRACTION SECOND FETUS: 7.7
FETAL FRACTION: 11.2

## 2024-05-19 ENCOUNTER — Ambulatory Visit (INDEPENDENT_AMBULATORY_CARE_PROVIDER_SITE_OTHER): Admitting: Obstetrics and Gynecology

## 2024-05-19 ENCOUNTER — Encounter: Payer: Self-pay | Admitting: Obstetrics and Gynecology

## 2024-05-19 VITALS — BP 146/93 | Wt 220.2 lb

## 2024-05-19 DIAGNOSIS — O0990 Supervision of high risk pregnancy, unspecified, unspecified trimester: Secondary | ICD-10-CM

## 2024-05-19 DIAGNOSIS — M545 Low back pain, unspecified: Secondary | ICD-10-CM

## 2024-05-19 DIAGNOSIS — O09899 Supervision of other high risk pregnancies, unspecified trimester: Secondary | ICD-10-CM

## 2024-05-19 DIAGNOSIS — Z8759 Personal history of other complications of pregnancy, childbirth and the puerperium: Secondary | ICD-10-CM | POA: Diagnosis not present

## 2024-05-19 DIAGNOSIS — O30042 Twin pregnancy, dichorionic/diamniotic, second trimester: Secondary | ICD-10-CM

## 2024-05-19 DIAGNOSIS — Z98891 History of uterine scar from previous surgery: Secondary | ICD-10-CM

## 2024-05-19 DIAGNOSIS — G8929 Other chronic pain: Secondary | ICD-10-CM

## 2024-05-19 DIAGNOSIS — O09892 Supervision of other high risk pregnancies, second trimester: Secondary | ICD-10-CM

## 2024-05-19 DIAGNOSIS — O0992 Supervision of high risk pregnancy, unspecified, second trimester: Secondary | ICD-10-CM

## 2024-05-19 DIAGNOSIS — Z8751 Personal history of pre-term labor: Secondary | ICD-10-CM | POA: Diagnosis not present

## 2024-05-19 DIAGNOSIS — E1169 Type 2 diabetes mellitus with other specified complication: Secondary | ICD-10-CM

## 2024-05-19 DIAGNOSIS — I10 Essential (primary) hypertension: Secondary | ICD-10-CM

## 2024-05-19 DIAGNOSIS — O30049 Twin pregnancy, dichorionic/diamniotic, unspecified trimester: Secondary | ICD-10-CM

## 2024-05-19 DIAGNOSIS — Z2839 Other underimmunization status: Secondary | ICD-10-CM

## 2024-05-19 DIAGNOSIS — Z3A15 15 weeks gestation of pregnancy: Secondary | ICD-10-CM | POA: Diagnosis not present

## 2024-05-19 MED ORDER — IRON 325 (65 FE) MG PO TABS: 1.0000 | ORAL_TABLET | ORAL | 3 refills | Status: DC

## 2024-05-19 NOTE — Patient Instructions (Signed)
 Use the following websites (and others) to help learn more about your contraception options and find the method that is right for you!   - [facility] website: https://www.[facility].org/learn/birth-control  - [employer]: https://www.[employer].org/methods

## 2024-05-19 NOTE — Progress Notes (Signed)
 PRENATAL VISIT NOTE  Subjective:  Misty [name] is a 33 y.o. [phone] at [redacted]w[redacted]d being seen today for ongoing prenatal care.  She is currently monitored for the following issues for this high-risk pregnancy and has Marijuana use; History of severe pre-eclampsia; Type 2 diabetes mellitus (HCC); History of preterm delivery; Chronic hypertension; History of cesarean section; History of maternal syphilis, currently pregnant; Supervision of high risk pregnancy, antepartum; Twin pregnancy, twins dichorionic and diamniotic; Short interval between pregnancies affecting pregnancy, antepartum; and Rubella non-immune status, antepartum on their problem list.  Patient reports pain in lower back and butt, mostly when sitting and then getting up.  Contractions: Not present. Vag. Bleeding: None.  Movement: Absent. Denies leaking of fluid.   The following portions of the patient's history were reviewed and updated as appropriate: allergies, current medications, past family history, past medical history, past social history, past surgical history and problem list.   Objective:    Vitals:   05/19/24 0916  BP: (!) 146/93  Weight: 220 lb 3.2 oz (99.9 kg)    Fetal Status:  Fetal Heart Rate (bpm): 147-A 152-B   Movement: Absent    General: Alert, oriented and cooperative. Patient is in no acute distress.  Skin: Skin is warm and dry. No rash noted.   Cardiovascular: Normal heart rate noted  Respiratory: Normal respiratory effort, no problems with respiration noted  Abdomen: Soft, gravid, appropriate for gestational age.  Pain/Pressure: Absent     Pelvic: Cervical exam deferred        Extremities: Normal range of motion.  Edema: None  Mental Status: Normal mood and affect. Normal behavior. Normal judgment and thought content.   Assessment and Plan:  Pregnancy: [id number] at [redacted]w[redacted]d  1. [redacted] weeks gestation of pregnancy (Primary) - AFP, Serum, Open Spina Bifida  2. History of severe pre-eclampsia  3.  History of preterm delivery  4. History of cesarean section Reviewed delivery mode, may need CS for reasons related to twins  5. History of maternal syphilis, currently pregnant Last titer 1:2 Was treated 10/2023 during last pregnancy  6. Supervision of high risk pregnancy, antepartum Reviewed options for contraception, does not want BTL Wants LARC  7. Dichorionic diamniotic twin pregnancy, antepartum Both twins found via doppler Anatomy scheduled for 10/16  8. Short interval between pregnancies affecting pregnancy, antepartum  9. Type 2 diabetes mellitus with other specified complication, unspecified whether long term insulin  use (HCC) Brought dexcom today, needs help putting it on On basaglar  20 units BID Reports sugars have been well controlled, 90s-120s, have not been over 200  Reports fasting have been low 90s  10. Rubella non-immune status, antepartum MMR pp  43. Chronic hypertension UPCr Has not taken meds yet today, hasn't eaten On nifeipine 60 mg daily, encouraged her to take regularly  Cont to take baby aspirin , pt states she is generally good about taking regularly  12. Chronic midline low back pain without sciatica Reviewed stretches, get up more frequently Call fi she needs PT   Preterm labor symptoms and general obstetric precautions including but not limited to vaginal bleeding, contractions, leaking of fluid and fetal movement were reviewed in detail with the patient. Please refer to After Visit Summary for other counseling recommendations.   Return in about 2 weeks (around 06/02/2024) for high OB.  Future Appointments  Date Time Provider Department Center  05/29/2024 10:00 AM [facility] NURSE [facility] [place] [facility]  06/01/2024 10:30 AM [facility]  06/01/2024 10:55 AM Fredirick Glenys RAMAN, MD [facility]  [facility]  06/18/2024 10:00 AM [facility] PROVIDER 1 [facility]  06/18/2024 10:30 AM [facility] US1 [facility]  06/30/2024  3:55 PM Nicholaus Burnard HERO, MD [facility]     Burnard HERO Nicholaus, MD

## 2024-05-20 LAB — PROTEIN / CREATININE RATIO, URINE
Creatinine, Urine: 270.5 mg/dL
Protein, Ur: 24.7 mg/dL
Protein/Creat Ratio: 91 mg/g{creat} (ref 0–200)

## 2024-05-21 ENCOUNTER — Ambulatory Visit: Payer: Self-pay | Admitting: Obstetrics and Gynecology

## 2024-05-21 ENCOUNTER — Encounter: Payer: Self-pay | Admitting: Obstetrics and Gynecology

## 2024-05-21 DIAGNOSIS — O280 Abnormal hematological finding on antenatal screening of mother: Secondary | ICD-10-CM | POA: Insufficient documentation

## 2024-05-21 DIAGNOSIS — R772 Abnormality of alphafetoprotein: Secondary | ICD-10-CM | POA: Insufficient documentation

## 2024-05-21 LAB — CULTURE, OB URINE

## 2024-05-21 LAB — AFP, SERUM, OPEN SPINA BIFIDA
AFP MoM: 3.7
AFP Value: 78.8 ng/mL
Gest. Age on Collection Date: 15.4 wk
Maternal Age At EDD: 34.1 a
OSBR Risk 1 IN: 110
Test Results:: POSITIVE — AB
Weight: 220 [lb_av]

## 2024-05-21 LAB — URINE CULTURE, OB REFLEX

## 2024-05-22 ENCOUNTER — Other Ambulatory Visit: Payer: Self-pay

## 2024-05-22 DIAGNOSIS — R772 Abnormality of alphafetoprotein: Secondary | ICD-10-CM

## 2024-05-26 ENCOUNTER — Ambulatory Visit

## 2024-05-29 ENCOUNTER — Ambulatory Visit

## 2024-05-29 ENCOUNTER — Other Ambulatory Visit: Payer: Self-pay | Admitting: Family Medicine

## 2024-05-29 ENCOUNTER — Telehealth: Admitting: Physician Assistant

## 2024-05-29 ENCOUNTER — Telehealth: Payer: Self-pay | Admitting: Obstetrics & Gynecology

## 2024-05-29 ENCOUNTER — Telehealth: Admitting: Family Medicine

## 2024-05-29 DIAGNOSIS — J069 Acute upper respiratory infection, unspecified: Secondary | ICD-10-CM

## 2024-05-29 MED ORDER — GUAIFENESIN-DM 100-10 MG/5ML PO SYRP: 10.0000 mL | ORAL_SOLUTION | ORAL | 0 refills | Status: DC | PRN

## 2024-05-29 MED ORDER — FLUTICASONE PROPIONATE 50 MCG/ACT NA SUSP: 2.0000 | Freq: Every day | NASAL | 0 refills | Status: DC

## 2024-05-29 MED ORDER — PROMETHAZINE-DM 6.25-15 MG/5ML PO SYRP: 5.0000 mL | ORAL_SOLUTION | Freq: Four times a day (QID) | ORAL | 0 refills | Status: DC | PRN

## 2024-05-29 NOTE — Progress Notes (Signed)
 Virtual Visit Consent   Misty [name], you are scheduled for a virtual visit with a [facility] provider today. Just as with appointments in the office, your consent must be obtained to participate. Your consent will be active for this visit and any virtual visit you may have with one of our providers in the next 365 days. If you have a MyChart account, a copy of this consent can be sent to you electronically.  As this is a virtual visit, video technology does not allow for your provider to perform a traditional examination. This may limit your provider's ability to fully assess your condition. If your provider identifies any concerns that need to be evaluated in person or the need to arrange testing (such as labs, EKG, etc.), we will make arrangements to do so. Although advances in technology are sophisticated, we cannot ensure that it will always work on either your end or our end. If the connection with a video visit is poor, the visit may have to be switched to a telephone visit. With either a video or telephone visit, we are not always able to ensure that we have a secure connection.  By engaging in this virtual visit, you consent to the provision of healthcare and authorize for your insurance to be billed (if applicable) for the services provided during this visit. Depending on your insurance coverage, you may receive a charge related to this service.  I need to obtain your verbal consent now. Are you willing to proceed with your visit today? Misty [name] has provided verbal consent on 05/29/2024 for a virtual visit (video or telephone). Misty Misty Dickinson, PA-C  Date: 05/29/2024 9:38 AM   Virtual Visit via Video Note   I, Misty [name], connected with  Misty [name]  ([id number], [birth year]) on 05/29/24 at  9:00 AM EDT by a video-enabled telemedicine application and verified that I am speaking with the correct person using two identifiers.  Location: Patient: Virtual Visit  Location Patient: Home Provider: Virtual Visit Location Provider: Home Office   I discussed the limitations of evaluation and management by telemedicine and the availability of in person appointments. The patient expressed understanding and agreed to proceed.    History of Present Illness: Misty [name] is a 33 y.o. who identifies as a female who was assigned female at birth, and is being seen today for URI symptoms.  HPI: URI  This is a new problem. The current episode started in the past 7 days (3 days). The problem has been gradually worsening. There has been no fever. Associated symptoms include congestion, coughing (dry), headaches, rhinorrhea (and post nasal drainage) and sinus pain. Pertinent negatives include no diarrhea, ear pain, nausea, plugged ear sensation, sore throat, swollen glands, vomiting or wheezing. She has tried nothing for the symptoms. The treatment provided no relief.   Patient is about [redacted] weeks pregnant Sick contact from children.  Problems:  Patient Active Problem List   Diagnosis Date Noted   Elevated AFP 05/21/2024   Rubella non-immune status, antepartum 05/07/2024   Short interval between pregnancies affecting pregnancy, antepartum 05/05/2024   Supervision of high risk pregnancy, antepartum 04/30/2024   Twin pregnancy, twins dichorionic and diamniotic 04/30/2024   History of maternal syphilis, 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   History of cesarean section 10/28/2023   Chronic hypertension 10/26/2023   History of preterm delivery 05/21/2023   Type 2 diabetes mellitus (HCC) 01/08/2023   History of severe pre-eclampsia 06/17/2021   Marijuana use 05/24/2021  Allergies:  Allergies  Allergen Reactions   Iodides Anaphylaxis and Itching   Morphine  And Codeine Anaphylaxis and Itching   Shellfish Allergy Anaphylaxis and Itching    Pt reports watery eyes   Ultram  [Tramadol  Hcl] Hives    Hives and swollen lips    Medications:  Current Outpatient  Medications:    fluticasone  (FLONASE ) 50 MCG/ACT nasal spray, Place 2 sprays into both nostrils daily., Disp: 16 g, Rfl: 0   guaiFENesin -dextromethorphan  (ROBITUSSIN DM) 100-10 MG/5ML syrup, Take 10 mLs by mouth every 4 (four) hours as needed for cough., Disp: 118 mL, Rfl: 0   acetaminophen  (TYLENOL ) 500 MG tablet, Take 2 tablets (1,000 mg total) by mouth every 6 (six) hours as needed for mild pain (pain score 1-3)., Disp: 100 tablet, Rfl: 2   aspirin  EC 81 MG tablet, Take 2 tablets (162 mg total) by mouth daily. Take after 12 weeks for prevention of preeclampsia later in pregnancy, Disp: 300 tablet, Rfl: 2   Blood Glucose Monitoring Suppl DEVI, 1 each by Does not apply route in the morning, at noon, and at bedtime. May substitute to any manufacturer covered by patient's insurance., Disp: 1 each, Rfl: 0   Blood Pressure Monitoring (BLOOD PRESSURE MONITOR AUTOMAT) DEVI, 1 each by Does not apply route daily. (Patient not taking: Reported on 05/19/2024), Disp: 1 each, Rfl: 0   Continuous Glucose Sensor (DEXCOM G7 SENSOR) MISC, Place one monitor onto the skin. Change every ten days (Patient not taking: Reported on 05/19/2024), Disp: 3 each, Rfl: 7   Ferrous Sulfate  (IRON ) 325 (65 Fe) MG TABS, Take 1 tablet (325 mg total) by mouth every other day., Disp: 30 tablet, Rfl: 3   Insulin  Glargine (BASAGLAR  KWIKPEN) 100 UNIT/ML, Inject 20 Units into the skin 2 (two) times daily., Disp: 15 mL, Rfl: 3   NIFEdipine  (ADALAT  CC) 60 MG 24 hr tablet, Take 1 tablet (60 mg total) by mouth daily., Disp: 120 tablet, Rfl: 2   Prenatal Vit-Fe Fumarate-FA (WESTAB PLUS) 27-1 MG TABS, TAKE 1 TABLET BY MOUTH DAILY, Disp: 30 tablet, Rfl: 11  Observations/Objective: Patient is well-developed, well-nourished in no acute distress.  Resting comfortably at home.  Head is normocephalic, atraumatic.  No labored breathing. However, patient does sound congested Speech is clear and coherent with logical content.  Patient is alert and  oriented at baseline.    Assessment and Plan: 1. Viral URI with cough (Primary) - fluticasone  (FLONASE ) 50 MCG/ACT nasal spray; Place 2 sprays into both nostrils daily.  Dispense: 16 g; Refill: 0 - guaiFENesin -dextromethorphan  (ROBITUSSIN DM) 100-10 MG/5ML syrup; Take 10 mLs by mouth every 4 (four) hours as needed for cough.  Dispense: 118 mL; Refill: 0  - Suspect viral URI, advised to take at home Covid test - Symptomatic medications of choice over the counter as needed - Add Flonase  and Robitussin DM (safest in pregnancy) - Push fluids - Rest - Seek further evaluation if symptoms change or worsen  Follow Up Instructions: I discussed the assessment and treatment plan with the patient. The patient was provided an opportunity to ask questions and all were answered. The patient agreed with the plan and demonstrated an understanding of the instructions.  A copy of instructions were sent to the patient via MyChart unless otherwise noted below.    The patient was advised to call back or seek an in-person evaluation if the symptoms worsen or if the condition fails to improve as anticipated.    Misty Misty Dickinson, PA-C

## 2024-05-29 NOTE — Patient Instructions (Signed)
 [name], thank you for joining Delon CHRISTELLA Dickinson, PA-C for today's virtual visit.  While this provider is not your primary care provider (PCP), if your PCP is located in our provider database this encounter information will be shared with them immediately following your visit.   A [facility] MyChart account gives you access to today's visit and all your visits, tests, and labs performed at [facility]  click here if you don't have a [facility] MyChart account or go to [email or web address]  Consent: (Patient) Misty [name] provided verbal consent for this virtual visit at the beginning of the encounter.  Current Medications:  Current Outpatient Medications:    fluticasone  (FLONASE ) 50 MCG/ACT nasal spray, Place 2 sprays into both nostrils daily., Disp: 16 g, Rfl: 0   guaiFENesin -dextromethorphan  (ROBITUSSIN DM) 100-10 MG/5ML syrup, Take 10 mLs by mouth every 4 (four) hours as needed for cough., Disp: 118 mL, Rfl: 0   acetaminophen  (TYLENOL ) 500 MG tablet, Take 2 tablets (1,000 mg total) by mouth every 6 (six) hours as needed for mild pain (pain score 1-3)., Disp: 100 tablet, Rfl: 2   aspirin  EC 81 MG tablet, Take 2 tablets (162 mg total) by mouth daily. Take after 12 weeks for prevention of preeclampsia later in pregnancy, Disp: 300 tablet, Rfl: 2   Blood Glucose Monitoring Suppl DEVI, 1 each by Does not apply route in the morning, at noon, and at bedtime. May substitute to any manufacturer covered by patient's insurance., Disp: 1 each, Rfl: 0   Blood Pressure Monitoring (BLOOD PRESSURE MONITOR AUTOMAT) DEVI, 1 each by Does not apply route daily. (Patient not taking: Reported on 05/19/2024), Disp: 1 each, Rfl: 0   Continuous Glucose Sensor (DEXCOM G7 SENSOR) MISC, Place one monitor onto the skin. Change every ten days (Patient not taking: Reported on 05/19/2024), Disp: 3 each, Rfl: 7   Ferrous Sulfate  (IRON ) 325 (65 Fe) MG TABS, Take 1 tablet (325 mg total) by mouth  every other day., Disp: 30 tablet, Rfl: 3   Insulin  Glargine (BASAGLAR  KWIKPEN) 100 UNIT/ML, Inject 20 Units into the skin 2 (two) times daily., Disp: 15 mL, Rfl: 3   NIFEdipine  (ADALAT  CC) 60 MG 24 hr tablet, Take 1 tablet (60 mg total) by mouth daily., Disp: 120 tablet, Rfl: 2   Prenatal Vit-Fe Fumarate-FA (WESTAB PLUS) 27-1 MG TABS, TAKE 1 TABLET BY MOUTH DAILY, Disp: 30 tablet, Rfl: 11   Medications ordered in this encounter:  Meds ordered this encounter  Medications   fluticasone  (FLONASE ) 50 MCG/ACT nasal spray    Sig: Place 2 sprays into both nostrils daily.    Dispense:  16 g    Refill:  0    Supervising Provider:   LAMPTEY, PHILIP O [[id number]]   guaiFENesin -dextromethorphan  (ROBITUSSIN DM) 100-10 MG/5ML syrup    Sig: Take 10 mLs by mouth every 4 (four) hours as needed for cough.    Dispense:  118 mL    Refill:  0    Supervising Provider:   BLAISE ALEENE KIDD [[id number]]     *If you need refills on other medications prior to your next appointment, please contact your pharmacy*  Follow-Up: Call back or seek an in-person evaluation if the symptoms worsen or if the condition fails to improve as anticipated.  [facility] [phone]  Other Instructions  Viral Respiratory Infection A respiratory infection is an illness that affects part of the respiratory system, such as the lungs, nose, or throat. A respiratory infection that  is caused by a virus is called a viral respiratory infection. Common types of viral respiratory infections include: A cold. The flu (influenza). A respiratory syncytial virus (RSV) infection. What are the causes? This condition is caused by a virus. The virus may spread through contact with droplets or direct contact with infected people or their mucus or secretions. The virus may spread from person to person (is contagious). What are the signs or symptoms? Symptoms of this condition include: A stuffy or runny nose. A sore throat or  cough. Shortness of breath or difficulty breathing. Yellow or green mucus (sputum). Other symptoms may include: A fever. Sweating or chills. Fatigue. Achy muscles. A headache. How is this diagnosed? This condition may be diagnosed based on: Your symptoms. A physical exam. Testing of secretions from the nose or throat. Chest X-ray. How is this treated? This condition may be treated with medicines, such as: Antiviral medicine. This may shorten the length of time a person has symptoms. Expectorants. These make it easier to cough up mucus. Decongestant nasal sprays. Acetaminophen  or NSAIDs, such as ibuprofen , to relieve fever and pain. Antibiotic medicines are not prescribed for viral infections.This is because antibiotics are designed to kill bacteria. They do not kill viruses. Follow these instructions at home: Managing pain and congestion Take over-the-counter and prescription medicines only as told by your health care provider. If you have a sore throat, gargle with a mixture of salt and water 3-4 times a day or as needed. To make salt water, completely dissolve -1 tsp (3-6 g) of salt in 1 cup (237 mL) of warm water. Use nose drops made from salt water to ease congestion and soften raw skin around your nose. Take 2 tsp (10 mL) of honey at bedtime to lessen coughing at night. Do not give honey to children who are younger than 1 year. Drink enough fluid to keep your urine pale yellow. This helps prevent dehydration and helps loosen up mucus. General instructions  Rest as much as possible. Do not drink alcohol. Do not use any products that contain nicotine or tobacco. These products include cigarettes, chewing tobacco, and vaping devices, such as e-cigarettes. If you need help quitting, ask your health care provider. Keep all follow-up visits. This is important. How is this prevented?     Get an annual flu shot. You may get the flu shot in late summer, fall, or winter. Ask your  health care provider when you should get your flu shot. Avoid spreading your infection to other people. If you are sick: Wash your hands with soap and water often, especially after you cough or sneeze. Wash for at least 20 seconds. If soap and water are not available, use alcohol-based hand sanitizer. Cover your mouth when you cough. Cover your nose and mouth when you sneeze. Do not share cups or eating utensils. Clean commonly used objects often. Clean commonly touched surfaces. Stay home from work or school as told by your health care provider. Avoid contact with people who are sick during cold and flu season. This is generally fall and winter. Contact a health care provider if: Your symptoms last for 10 days or longer. Your symptoms get worse over time. You have severe sinus pain in your face or forehead. The glands in your jaw or neck become very swollen. You have shortness of breath. Get help right away if you: Feel pain or pressure in your chest. Have trouble breathing. Faint or feel like you will faint. Have severe and persistent  vomiting. Feel confused or disoriented. These symptoms may represent a serious problem that is an emergency. Do not wait to see if the symptoms will go away. Get medical help right away. Call your local emergency services (911 in the [place] Do not drive yourself to the hospital. Summary A respiratory infection is an illness that affects part of the respiratory system, such as the lungs, nose, or throat. A respiratory infection that is caused by a virus is called a viral respiratory infection. Common types of viral respiratory infections include a cold, influenza, and respiratory syncytial virus (RSV) infection. Symptoms of this condition include a stuffy or runny nose, cough, fatigue, achy muscles, sore throat, and fevers or chills. Antibiotic medicines are not prescribed for viral infections. This is because antibiotics are designed to kill bacteria. They  are not effective against viruses. This information is not intended to replace advice given to you by your health care provider. Make sure you discuss any questions you have with your health care provider. Document Revised: 11/24/2020 Document Reviewed: 11/24/2020 Elsevier Patient Education  2024 Elsevier Inc.  Common Medications Safe in Pregnancy  Acne:      Constipation:  Benzoyl Peroxide     Colace  Clindamycin      Dulcolax Suppository  Topica Erythromycin     Fibercon  Salicylic Acid      Metamucil         Miralax  AVOID:        Senakot   Accutane    Cough:  Retin-A       Cough Drops  Tetracycline      Phenergan  w/ Codeine if Rx  Minocycline      Robitussin (Plain & DM)  Antibiotics:     Crabs/Lice:  Ceclor       RID  Cephalosporins    AVOID:  E-Mycins      Kwell  Keflex   Macrobid/Macrodantin   Diarrhea:  Penicillin       Kao-Pectate  Zithromax       Imodium AD         PUSH FLUIDS AVOID:       Cipro      Fever:  Tetracycline      Tylenol  (Regular or Extra  Minocycline       Strength)  Levaquin      Extra Strength-Do not          Exceed 8 tabs/24 hrs Caffeine:        200mg /day (equiv. To 1 cup of coffee or  approx. 3 12 oz sodas)         Gas: Cold/Hayfever:       Gas-X  Benadryl       Mylicon  Claritin       Phazyme  **Claritin-D        Chlor-Trimeton    Headaches:  Dimetapp      ASA-Free Excedrin  Drixoral-Non-Drowsy     Cold Compress  Mucinex  (Guaifenasin)     Tylenol  (Regular or Extra  Sudafed/Sudafed-12 Hour     Strength)  **Sudafed PE Pseudoephedrine   Tylenol  Cold & Sinus     Vicks Vapor Rub  Zyrtec   **AVOID if Problems With Blood Pressure         Heartburn: Avoid lying down for at least 1 hour after meals  Aciphex      Maalox     Rash:  Milk of Magnesia     Benadryl     Mylanta       1% Hydrocortisone Cream  Pepcid   Pepcid  Complete   Sleep Aids:  Prevacid      Ambien    Prilosec       Benadryl   Rolaids       Chamomile Tea  Tums (Limit  4/day)     Unisom          Tylenol  PM         Warm milk-add vanilla or  Hemorrhoids:       Sugar for taste  Anusol/Anusol H.C.  (RX: Analapram 2.5%)  Sugar Substitutes:  Hydrocortisone OTC     Ok in moderation  Preparation H      Tucks        Vaseline lotion applied to tissue with wiping    Herpes:     Throat:  Acyclovir      Oragel  Famvir  Valtrex     Vaccines:         Flu Shot Leg Cramps:       *Gardasil  Benadryl       Hepatitis A         Hepatitis B Nasal Spray:       Pneumovax  Saline Nasal Spray     Polio Booster         Tetanus Nausea:       Tuberculosis test or PPD  Vitamin B6 25 mg TID   AVOID:    Dramamine      *Gardasil  Emetrol       Live Poliovirus  Ginger Root 250 mg QID    MMR (measles, mumps &  High Complex Carbs @ Bedtime    rebella)  Sea Bands-Accupressure    Varicella (Chickenpox)  Unisom  1/2 tab TID     *No known complications           If received before Pain:         Known pregnancy;   Darvocet       Resume series after  Lortab        Delivery  Percocet    Yeast:   Tramadol       Femstat  Tylenol  3      Gyne-lotrimin  Ultram        Monistat  Vicodin           MISC:         All Sunscreens           Hair Coloring/highlights          Insect Repellant's          (Including DEET)         Mystic Tans    If you have been instructed to have an in-person evaluation today at a local Urgent Care facility, please use the link below. It will take you to a list of all of our available [facility], including address, phone number and hours of operation. Please do not delay care.  [facility]  If you or a family member do not have a primary care provider, use the link below to schedule a visit and establish care. When you choose a [facility] primary care physician or advanced practice provider, you gain a long-term partner in health. Find a Primary Care Provider  Learn more about [facility]'s in-office and virtual care  options: [facility] Get Care Now

## 2024-05-29 NOTE — Telephone Encounter (Signed)
 Patient called to cancel her appointment because she was not feeling well. She is requesting medication, and a call form the nurse.

## 2024-05-29 NOTE — Telephone Encounter (Signed)
 Pt informed RN she has a upper respiratory virus, she's very congested, and very tired.  She has not tested for specific viruses. She had a virtual visit this morning, that provider ordered robitussin.  Pt pharmacy told her that was otc, she states she doesn't have the money to purchase it.  She requested to know if there is a prescription that can be written that her insurance may cover.  RN reviewed situation with Dr. Eldonna, cough syrup orders sent to pt pharmacy by provider.  Advised pt of prescription sent and provided pt with list of safe otc meds as well.    [place], RN

## 2024-05-29 NOTE — Progress Notes (Signed)
 Pt did not show for this visit. DWB

## 2024-06-01 ENCOUNTER — Other Ambulatory Visit

## 2024-06-01 ENCOUNTER — Encounter: Admitting: Family Medicine

## 2024-06-02 ENCOUNTER — Encounter: Payer: Self-pay | Admitting: Obstetrics and Gynecology

## 2024-06-03 ENCOUNTER — Inpatient Hospital Stay (HOSPITAL_COMMUNITY)
Admission: AD | Admit: 2024-06-03 | Discharge: 2024-06-03 | Disposition: A | Discharge status: Home / Self Care | Attending: Obstetrics and Gynecology | Admitting: Obstetrics and Gynecology

## 2024-06-03 ENCOUNTER — Other Ambulatory Visit: Payer: Self-pay

## 2024-06-03 ENCOUNTER — Encounter (HOSPITAL_COMMUNITY): Payer: Self-pay | Admitting: Obstetrics and Gynecology

## 2024-06-03 DIAGNOSIS — Z3A17 17 weeks gestation of pregnancy: Secondary | ICD-10-CM

## 2024-06-03 DIAGNOSIS — O26892 Other specified pregnancy related conditions, second trimester: Secondary | ICD-10-CM

## 2024-06-03 DIAGNOSIS — O09892 Supervision of other high risk pregnancies, second trimester: Secondary | ICD-10-CM | POA: Insufficient documentation

## 2024-06-03 DIAGNOSIS — G8929 Other chronic pain: Secondary | ICD-10-CM | POA: Diagnosis not present

## 2024-06-03 DIAGNOSIS — O24414 Gestational diabetes mellitus in pregnancy, insulin controlled: Secondary | ICD-10-CM | POA: Insufficient documentation

## 2024-06-03 DIAGNOSIS — M545 Low back pain, unspecified: Secondary | ICD-10-CM

## 2024-06-03 DIAGNOSIS — Z79899 Other long term (current) drug therapy: Secondary | ICD-10-CM | POA: Diagnosis not present

## 2024-06-03 DIAGNOSIS — M5459 Other low back pain: Secondary | ICD-10-CM | POA: Diagnosis not present

## 2024-06-03 DIAGNOSIS — O0990 Supervision of high risk pregnancy, unspecified, unspecified trimester: Secondary | ICD-10-CM

## 2024-06-03 DIAGNOSIS — O10012 Pre-existing essential hypertension complicating pregnancy, second trimester: Secondary | ICD-10-CM | POA: Diagnosis not present

## 2024-06-03 DIAGNOSIS — O30049 Twin pregnancy, dichorionic/diamniotic, unspecified trimester: Secondary | ICD-10-CM

## 2024-06-03 DIAGNOSIS — O30042 Twin pregnancy, dichorionic/diamniotic, second trimester: Secondary | ICD-10-CM

## 2024-06-03 DIAGNOSIS — O4702 False labor before 37 completed weeks of gestation, second trimester: Secondary | ICD-10-CM | POA: Diagnosis present

## 2024-06-03 LAB — URINALYSIS, ROUTINE W REFLEX MICROSCOPIC
Bilirubin Urine: NEGATIVE
Glucose, UA: NEGATIVE mg/dL
Hgb urine dipstick: NEGATIVE
Ketones, ur: 5 mg/dL — AB
Nitrite: NEGATIVE
Protein, ur: NEGATIVE mg/dL
Specific Gravity, Urine: 1.021 (ref 1.005–1.030)
pH: 5 (ref 5.0–8.0)

## 2024-06-03 MED ORDER — CYCLOBENZAPRINE HCL 5 MG PO TABS: 5.0000 mg | ORAL_TABLET | Freq: Three times a day (TID) | ORAL | 0 refills | Status: DC | PRN

## 2024-06-03 NOTE — MAU Provider Note (Cosign Needed Addendum)
 History     CSN: [id number]  Arrival date and time: 06/03/24 1208   None     Chief Complaint  Patient presents with   Contractions   HPI  Patient reports to MAU due to what she calls Braxton Hicks contractions since last night. She reports being on her feet walking all day yesterday. She denies any mechanical injury or acute moment of onset of the pain. She began to have a tight, twisting sensation in her low back and b/l superior buttock region, which she describes as the same symptoms she had in her previous pregnancy when she was diagnosed w/ Braxton Hicks contractions. No radiation down the leg. She took two 500mg  tylenol  with modest benefit but throughout the night she felt the need to get up and stand at the side of the bed because of the pain. She decided to come in this morning due to continuing pain. The pain does not move to her anterior abdomen.   Denies frequency, dysuria, hematuria, suprapubic pain. Denies constipation, melena or hematochezia. Denies fever, vaginal bleeding or discharge, recent sexual intercourse or other concern for STI. 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   She is on 18U of basaglar  for her gestational diabetes (wearing CGM) and nifedipine  BID for her chronic HTN. Reports compliance.  OB History     Gravida  4   Para  2   Term  1   Preterm  1   AB  1   Living  2      SAB  1   IAB  0   Ectopic  0   Multiple  0   Live Births  2        Obstetric Comments  2022  BP was high 2025 c/s: fetal heart was dropping, BP was sky high         Past Medical History:  Diagnosis Date   Abscess 09/02/2020   Allergy    Anemia 2015   Asthma    as child   Chronic hypertension with superimposed preeclampsia 05/24/2021   Diabetes mellitus without complication (HCC) 2015   DKA (diabetic ketoacidosis) (HCC) 09/02/2020   Eczema    History of pre-eclampsia    Hypertension 09/2013   Trichomonas infection 04/2023   UTI  (urinary tract infection)     Past Surgical History:  Procedure Laterality Date   Abcess on back     CESAREAN SECTION N/A 10/27/2023   Procedure: CESAREAN SECTION;  Surgeon: Zina Jerilynn LABOR, MD;  Location: [facility] ORS;  Service: Obstetrics;  Laterality: N/A;    Family History  Problem Relation Age of Onset   Migraines Mother    Hypertension Mother    Heart disease Mother 3       CHF   Diabetes Mother    Healthy Father    Sickle cell anemia Brother    Migraines Maternal Grandmother    Diabetes Maternal Grandmother    Heart disease Maternal Grandmother    Hypertension Maternal Grandfather    Diabetes Maternal Grandfather    Heart disease Maternal Grandfather    Asthma Neg Hx    Cancer Neg Hx     Social History   Tobacco Use   Smoking status: Former    Current packs/day: 0.00    Average packs/day: 0.5 packs/day for 2.0 years (1.0 ttl pk-yrs)    Types: Cigarettes    Start date: 09/09/2011    Quit date: 09/08/2013    Years since quitting:  10.7   Smokeless tobacco: Never  Vaping Use   Vaping status: Never Used  Substance Use Topics   Alcohol use: No    Alcohol/week: 0.0 standard drinks of alcohol   Drug use: No    Allergies:  Allergies  Allergen Reactions   Iodides Anaphylaxis and Itching   Morphine  And Codeine Anaphylaxis and Itching   Shellfish Allergy Anaphylaxis and Itching    Pt reports watery eyes   Ultram  [Tramadol  Hcl] Hives    Hives and swollen lips     Medications Prior to Admission  Medication Sig Dispense Refill Last Dose/Taking   aspirin  EC 81 MG tablet Take 2 tablets (162 mg total) by mouth daily. Take after 12 weeks for prevention of preeclampsia later in pregnancy 300 tablet 2 06/02/2024   Insulin  Glargine (BASAGLAR  KWIKPEN) 100 UNIT/ML Inject 20 Units into the skin 2 (two) times daily. 15 mL 3 06/03/2024   NIFEdipine  (ADALAT  CC) 60 MG 24 hr tablet Take 1 tablet (60 mg total) by mouth daily. 120 tablet 2 06/03/2024   Prenatal Vit-Fe Fumarate-FA  (WESTAB PLUS) 27-1 MG TABS TAKE 1 TABLET BY MOUTH DAILY 30 tablet 11 06/03/2024   acetaminophen  (TYLENOL ) 500 MG tablet Take 2 tablets (1,000 mg total) by mouth every 6 (six) hours as needed for mild pain (pain score 1-3). 100 tablet 2    Blood Glucose Monitoring Suppl DEVI 1 each by Does not apply route in the morning, at noon, and at bedtime. May substitute to any manufacturer covered by patient's insurance. 1 each 0    Blood Pressure Monitoring (BLOOD PRESSURE MONITOR AUTOMAT) DEVI 1 each by Does not apply route daily. (Patient not taking: Reported on 05/19/2024) 1 each 0    Continuous Glucose Sensor (DEXCOM G7 SENSOR) MISC Place one monitor onto the skin. Change every ten days (Patient not taking: Reported on 05/19/2024) 3 each 7    Ferrous Sulfate  (IRON ) 325 (65 Fe) MG TABS Take 1 tablet (325 mg total) by mouth every other day. 30 tablet 3    fluticasone  (FLONASE ) 50 MCG/ACT nasal spray Place 2 sprays into both nostrils daily. 16 g 0    guaiFENesin -dextromethorphan  (ROBITUSSIN DM) 100-10 MG/5ML syrup Take 10 mLs by mouth every 4 (four) hours as needed for cough. 118 mL 0    promethazine -dextromethorphan  (PROMETHAZINE -DM) 6.25-15 MG/5ML syrup Take 5 mLs by mouth 4 (four) times daily as needed for cough. 118 mL 0     Review of Systems  Constitutional:  Negative for chills, fatigue and fever.  Respiratory:  Negative for shortness of breath.   Cardiovascular:  Negative for chest pain.  Gastrointestinal:  Negative for abdominal pain.  Genitourinary:  Negative for difficulty urinating, dysuria, flank pain, pelvic pain, vaginal bleeding, vaginal discharge and vaginal pain.  Musculoskeletal:  Positive for back pain.  Neurological:  Negative for dizziness and headaches.  Psychiatric/Behavioral: Negative.     Physical Exam   Blood pressure 126/69, pulse 87, temperature 98.5 F (36.9 C), temperature source Oral, resp. rate 17, height 5' 3.5 (1.613 m), weight 99.3 kg, SpO2 100%, not currently  breastfeeding.  Physical Exam General: Awake, alert, NAD. Communicates clearly. Abdomen: Soft, non tender gravid abdomen. Low lying C section scar. No suprapubic pain or TTP around her gravid belly. No guarding, rebound or rigidity. Negative CVA tenderness. Back: Tenderness in the b/l lower lumbar and b/l superior buttock regions. B/l paraspinal hypertonicity. She reports the pain is mildly improved with deep pressure. ROM symmetrical but generally limited. No step-off or deformity  appreciated.  GU: No lesions or discharge. Cervix long, closed and posterior.     Dilation: Closed Effacement (%): Thick Cervical Position: Posterior Exam by:: Olam Boards, CNM   Imaging:  Bedside US  to assess FHR x2.  FHR Baby A 150s, FHR Baby B 140s, with subjectively normal amniotic fluid, and fetal movement x 2 noted.   Limited OB US  Date: 06/03/24 EDD : 11/06/24 based on 12 week US  Viability:  FHT detected x2 Fetal position: Baby A vertex, Baby B breech/transverse  Pt informed that the ultrasound is considered a limited OB ultrasound and is not intended to be a complete ultrasound exam.  Patient also informed that the ultrasound is not being completed with the intent of assessing for fetal or placental anomalies or any pelvic abnormalities.  Explained that the purpose of today's ultrasound is to assess for  viability.  Patient acknowledges the purpose of the exam and the limitations of the study.     MAU Course  Procedures  MDM No sign of active labor on cervical exam. Low concern for infection or acute abdomen. Differential includes primarily Braxton Hicks contractions vs pelvic girdle pain vs UTI.  Plan to manage pain w/ tylenol  and flexeril , referral to PT given hx of similar pain in prior pregnancy. Urine culture to further r/o UTI/ASB.   Assessment and Plan    Leafy Scriver 06/03/2024, 1:37 PM   A/P: 1. Supervision of high risk pregnancy, antepartum   2. Dichorionic diamniotic twin  pregnancy, antepartum   3. Chronic midline low back pain without sciatica   4. [redacted] weeks gestation of pregnancy      D/C home  Midwife Attestation:  I personally saw and evaluated the patient, performing the key elements of the service. I developed and verified the management plan that is described in the resident's/student's note, and I agree with the content with my edits above. VSS, HRR&R, Resp unlabored, Legs neg.    Olam Boards, CNM 8:37 PM

## 2024-06-03 NOTE — MAU Note (Signed)
 Attempted doppler. Heard FHR in multiple locations with a rate of 140-150. Unable to ascertain if it was more than one fetus.

## 2024-06-03 NOTE — MAU Note (Signed)
 Misty [name] is a 33 y.o. at [redacted]w[redacted]d here in MAU reporting: she's having Braxton Hicks ctxs since last night.  Reports pain starts in lower back and radiates into top of buttocks.  Denies VB or LOF.  LMP:  Onset of complaint: yesterday Pain score: 9 Vitals:   06/03/24 1245  BP: 134/71  Pulse: 80  Resp: 17  Temp: 98.5 F (36.9 C)  SpO2: 100%     FHT: will doppler once roomed secondary wearing dress  Lab orders placed from triage: UA

## 2024-06-06 LAB — CULTURE, OB URINE: Culture: 60000 — AB

## 2024-06-07 ENCOUNTER — Other Ambulatory Visit: Payer: Self-pay | Admitting: Obstetrics and Gynecology

## 2024-06-07 DIAGNOSIS — O2342 Unspecified infection of urinary tract in pregnancy, second trimester: Secondary | ICD-10-CM

## 2024-06-07 MED ORDER — NITROFURANTOIN MONOHYD MACRO 100 MG PO CAPS: 100.0000 mg | ORAL_CAPSULE | Freq: Two times a day (BID) | ORAL | 0 refills | Status: DC

## 2024-06-07 NOTE — Progress Notes (Signed)
 TC to notify patient of (+) UCx results, antibiotic needed, verified pharmacy desired. Advised to complete abx course as prescribed and to drink plenty of water to also help clear bacteria from urinary tract. No questions from patient. Patient verbalized an understanding of the plan of care and agrees.   Ala Cart, CNM  06/07/2024 10:20 AM

## 2024-06-08 ENCOUNTER — Encounter: Admitting: Student

## 2024-06-08 ENCOUNTER — Telehealth: Payer: Self-pay | Admitting: Family Medicine

## 2024-06-08 NOTE — Telephone Encounter (Signed)
 Patient called to reschedule appt for today. She says she was seen in ER last week because she felt like she was having contractions and she is still feeling the pain today.

## 2024-06-10 ENCOUNTER — Telehealth: Payer: Self-pay

## 2024-06-10 ENCOUNTER — Telehealth (INDEPENDENT_AMBULATORY_CARE_PROVIDER_SITE_OTHER): Admitting: Obstetrics and Gynecology

## 2024-06-10 DIAGNOSIS — Z8759 Personal history of other complications of pregnancy, childbirth and the puerperium: Secondary | ICD-10-CM

## 2024-06-10 DIAGNOSIS — O30042 Twin pregnancy, dichorionic/diamniotic, second trimester: Secondary | ICD-10-CM

## 2024-06-10 DIAGNOSIS — Z3A18 18 weeks gestation of pregnancy: Secondary | ICD-10-CM

## 2024-06-10 DIAGNOSIS — O10919 Unspecified pre-existing hypertension complicating pregnancy, unspecified trimester: Secondary | ICD-10-CM

## 2024-06-10 DIAGNOSIS — O10012 Pre-existing essential hypertension complicating pregnancy, second trimester: Secondary | ICD-10-CM

## 2024-06-10 DIAGNOSIS — O0990 Supervision of high risk pregnancy, unspecified, unspecified trimester: Secondary | ICD-10-CM

## 2024-06-10 DIAGNOSIS — Z98891 History of uterine scar from previous surgery: Secondary | ICD-10-CM | POA: Diagnosis not present

## 2024-06-10 DIAGNOSIS — Z8751 Personal history of pre-term labor: Secondary | ICD-10-CM | POA: Diagnosis not present

## 2024-06-10 DIAGNOSIS — O219 Vomiting of pregnancy, unspecified: Secondary | ICD-10-CM

## 2024-06-10 DIAGNOSIS — O24112 Pre-existing diabetes mellitus, type 2, in pregnancy, second trimester: Secondary | ICD-10-CM

## 2024-06-10 DIAGNOSIS — O09899 Supervision of other high risk pregnancies, unspecified trimester: Secondary | ICD-10-CM

## 2024-06-10 MED ORDER — ONDANSETRON HCL 4 MG PO TABS: 4.0000 mg | ORAL_TABLET | Freq: Three times a day (TID) | ORAL | 2 refills | Status: DC | PRN

## 2024-06-10 NOTE — Progress Notes (Signed)
 OBSTETRICS PRENATAL VIRTUAL VISIT ENCOUNTER NOTE  Provider location: [facility] for [facility]   Patient location: Home  I connected with Misty [name] on 06/10/24 at 11:15 AM EDT by MyChart Video Encounter and verified that I am speaking with the correct person using two identifiers. I discussed the limitations, risks, security and privacy concerns of performing an evaluation and management service virtually and the availability of in person appointments. I also discussed with the patient that there may be a patient responsible charge related to this service. The patient expressed understanding and agreed to proceed. Subjective:  Misty [name] is a 33 y.o. [phone] at [redacted]w[redacted]d being seen today for ongoing prenatal care.  She is currently monitored for the following issues for this high-risk pregnancy and has Chronic hypertension affecting pregnancy; Marijuana use; History of severe pre-eclampsia; Type 2 diabetes mellitus (HCC); History of preterm delivery; Chronic hypertension; History of cesarean section; History of maternal syphilis, currently pregnant; Supervision of high risk pregnancy, antepartum; Twin pregnancy, twins dichorionic and diamniotic; Short interval between pregnancies affecting pregnancy, antepartum; Rubella non-immune status, antepartum; and Elevated AFP on their problem list.  Patient reports nausea and vomiting.   . Vag. Bleeding: None.   . Denies any leaking of fluid.   The following portions of the patient's history were reviewed and updated as appropriate: allergies, current medications, past family history, past medical history, past social history, past surgical history and problem list.   Objective:    There were no vitals filed for this visit.  Fetal Status:           General: Alert, oriented and cooperative. Patient is in no acute distress.  Respiratory: Normal respiratory effort, no problems with respiration noted  Mental Status:  Normal mood and affect. Normal behavior. Normal judgment and thought content.  Rest of physical exam deferred due to type of encounter  Imaging: No results found.  Assessment and Plan:  Pregnancy: [id number] at [redacted]w[redacted]d 1. [redacted] weeks gestation of pregnancy (Primary)   2. Chronic hypertension affecting pregnancy BP elevated today, but pt has not taken her medication today 2/2 nausea  3. Type 2 diabetes mellitus complicating pregnancy in second trimester, antepartum Pt states fasting blood sugar usually around 97 PPBS:  4. Dichorionic diamniotic twin pregnancy in second trimester Pt has imaging on 06/18/24  5. Supervision of high risk pregnancy, antepartum Continue routine prenatal care  6. Short interval between pregnancies affecting pregnancy, antepartum   7. History of severe pre-eclampsia No s/sx of preeclampsia  8. History of preterm delivery No s/sx of preterm labor  9. History of cesarean section   10. History of maternal syphilis, currently pregnant   69. Nausea and vomiting during pregnancy Rx for zofran  sent - ondansetron  (ZOFRAN ) 4 MG tablet; Take 1 tablet (4 mg total) by mouth every 8 (eight) hours as needed for nausea or vomiting.  Dispense: 30 tablet; Refill: 2  Preterm labor symptoms and general obstetric precautions including but not limited to vaginal bleeding, contractions, leaking of fluid and fetal movement were reviewed in detail with the patient. I discussed the assessment and treatment plan with the patient. The patient was provided an opportunity to ask questions and all were answered. The patient agreed with the plan and demonstrated an understanding of the instructions. The patient was advised to call back or seek an in-person office evaluation/go to [facility] at [facility] for any urgent or concerning symptoms. Please refer to After Visit Summary for other  counseling recommendations.   I provided 10 minutes of face-to-face time during this  encounter.  Return in about 2 weeks (around 06/24/2024) for [facility], in person.  Future Appointments  Date Time Provider Department Center  06/16/2024 11:00 AM Danelle Mliss PARAS, PT [facility] None  06/18/2024  9:00 AM [facility] GENETIC COUNSELING RM [facility]  06/18/2024 10:00 AM [facility] PROVIDER 1 [facility]  06/18/2024 10:30 AM [facility] US1 [facility]  06/30/2024  3:55 PM Nicholaus Burnard HERO, MD [facility]    Jerilynn DELENA Buddle, MD [facility] for [facility], [facility]

## 2024-06-15 NOTE — Therapy (Incomplete)
 OUTPATIENT PHYSICAL THERAPY THORACOLUMBAR EVALUATION   Patient Name: Misty [name] MRN: [id number] DOB:[birth year], 33 y.o., female Today's Date: 06/15/2024  END OF SESSION:   Past Medical History:  Diagnosis Date   Abscess 09/02/2020   Allergy    Anemia 2015   Asthma    as child   Chronic hypertension with superimposed preeclampsia 05/24/2021   Diabetes mellitus without complication (HCC) 2015   DKA (diabetic ketoacidosis) (HCC) 09/02/2020   Eczema    History of pre-eclampsia    Hypertension 09/2013   Trichomonas infection 04/2023   UTI (urinary tract infection)    Past Surgical History:  Procedure Laterality Date   Abcess on back     CESAREAN SECTION N/A 10/27/2023   Procedure: CESAREAN SECTION;  Surgeon: Zina Jerilynn LABOR, MD;  Location: [facility] LD ORS;  Service: Obstetrics;  Laterality: N/A;   Patient Active Problem List   Diagnosis Date Noted   Elevated AFP 05/21/2024   Rubella non-immune status, antepartum 05/07/2024   Short interval between pregnancies affecting pregnancy, antepartum 05/05/2024   Supervision of high risk pregnancy, antepartum 04/30/2024   Twin pregnancy, twins dichorionic and diamniotic 04/30/2024   History of maternal syphilis, 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   History of cesarean section 10/28/2023   Chronic hypertension 10/26/2023   History of preterm delivery 05/21/2023   Type 2 diabetes mellitus (HCC) 01/08/2023   History of severe pre-eclampsia 06/17/2021   Marijuana use 05/24/2021   Chronic hypertension affecting pregnancy 09/2013    PCP: Celestia Rosaline SQUIBB, NP   REFERRING PROVIDER: Milly Olam LABOR DEWAINE   REFERRING DIAG:  O83.049 (ICD-10-CM) - Dichorionic diamniotic twin pregnancy, antepartum  M54.50,G89.29 (ICD-10-CM) - Chronic midline low back pain without sciatica  Z3A.17 (ICD-10-CM) - [redacted] weeks gestation of pregnancy    Rationale for Evaluation and Treatment: {HABREHAB:27488}  THERAPY DIAG:  No diagnosis found.  ONSET  DATE: ***  SUBJECTIVE:                                                                                                                                                                                           SUBJECTIVE STATEMENT: *** EDD:   PERTINENT HISTORY:  ***  Back pain, 17 weeks with di/di twins pregnancy   PAIN:  Are you having pain? Yes: NPRS scale: *** Pain location: *** Pain description: *** Aggravating factors: *** Relieving factors: ***  PRECAUTIONS: Other: preganant  RED FLAGS: {PT Red Flags:29287}   WEIGHT BEARING RESTRICTIONS: No  FALLS:  Has patient fallen in last 6 months? {fallsyesno:27318}  LIVING ENVIRONMENT: Lives with: {OPRC lives with:25569::lives with their family} Lives in: {Lives in:25570} Stairs: {opstairs:27293} Has following equipment at  home: {Assistive devices:23999}  OCCUPATION: ***  PLOF: {PLOF:24004}  PATIENT GOALS: ***  NEXT MD VISIT: ***  OBJECTIVE:  Note: Objective measures were completed at Evaluation unless otherwise noted.  DIAGNOSTIC FINDINGS:  NA  PATIENT SURVEYS:  PSFS: THE PATIENT SPECIFIC FUNCTIONAL SCALE  Place score of 0-10 (0 = unable to perform activity and 10 = able to perform activity at the same level as before injury or problem)  Activity Date: ***         2.     3.     4.      Total Score ***      Total Score = Sum of activity scores/number of activities  Minimally Detectable Change: 3 points (for single activity); 2 points (for average score)  [facility] (nd). The Patient Specific Functional Scale . Retrieved from [email or web address]   COGNITION: Overall cognitive status: Within functional limits for tasks assessed     SENSATION: {sensation:27233}  MUSCLE LENGTH: Hamstrings: Right *** deg; Left *** deg Debby test: Right *** deg; Left *** deg  POSTURE: {posture:25561}  PALPATION: ***  LUMBAR ROM:   AROM eval   Flexion   Extension   Right lateral flexion   Left lateral flexion   Right rotation   Left rotation    (Blank rows = not tested)  LOWER EXTREMITY ROM:     {AROM/PROM:27142}  Right eval Left eval  Hip flexion    Hip extension    Hip abduction    Hip adduction    Hip internal rotation    Hip external rotation    Knee flexion    Knee extension    Ankle dorsiflexion    Ankle plantarflexion    Ankle inversion    Ankle eversion     (Blank rows = not tested)  LOWER EXTREMITY MMT:    MMT Right eval Left eval  Hip flexion    Hip extension    Hip abduction    Hip adduction    Hip internal rotation    Hip external rotation    Knee flexion    Knee extension    Ankle dorsiflexion    Ankle plantarflexion    Ankle inversion    Ankle eversion     (Blank rows = not tested)  LUMBAR SPECIAL TESTS:  {lumbar special test:25242}  FUNCTIONAL TESTS:  {Functional tests:24029}  GAIT: Distance walked: *** Assistive device utilized: {Assistive devices:23999} Level of assistance: {Levels of assistance:24026} Comments: ***  TREATMENT DATE: ***                                                                                                                              06/16/24 See pt ed and HEP    PATIENT EDUCATION:  Education details: PT eval findings, anticipated POC, initial HEP, and ***   Person educated: Patient Education method: Explanation, Demonstration, Tactile cues, Verbal cues, and Handouts Education comprehension: verbalized understanding and returned demonstration  HOME  EXERCISE PROGRAM: ***  ASSESSMENT:  CLINICAL IMPRESSION: Patient is a 33 y.o. female who was seen today for physical therapy evaluation and treatment for back pain during pregnancy. ***.   OBJECTIVE IMPAIRMENTS: {opptimpairments:25111}.   ACTIVITY LIMITATIONS: {activitylimitations:27494}  PARTICIPATION LIMITATIONS: {participationrestrictions:25113}  PERSONAL FACTORS: {Personal  factors:25162} are also affecting patient's functional outcome.   REHAB POTENTIAL: {rehabpotential:25112}  CLINICAL DECISION MAKING: {clinical decision making:25114}  EVALUATION COMPLEXITY: {Evaluation complexity:25115}   GOALS: Goals reviewed with patient? {yes/no:20286}  SHORT TERM GOALS: Target date: {follow up:25551}  The patient will demonstrate knowledge of basic self care strategies and exercises to promote healing  including log-rolling to get in/out of bed   Baseline: Goal status: INITIAL  2.  The patient will report a 30% improvement in pain levels with functional activities which are currently difficult including sit to stand, turning over in bed and bending Baseline:  Goal status: INITIAL  3.  *** Baseline:  Goal status: INITIAL    LONG TERM GOALS: Target date: {follow up:25551}  The patient will be independent in a safe self progression of a home exercise program to promote further recovery of function   Baseline:  Goal status: INITIAL  2. The patient will report a 75% improvement in pain levels with functional activities which are currently difficult including bed mobility, sit to stand, bending Baseline:  Goal status: INITIAL  3.  The patient will have improved trunk flexor and extensor muscle strength to at least 4+/5 needed for lifting medium weight objects and carrying her infant  Baseline:  Goal status: INITIAL  4.  Full lumbar spine and hip ROM bilaterally needed for bed mobility, bending and taking care of her children Baseline:  Goal status: INITIAL  5.  Improved modified Oswestry score to *** indicating improved function with less pain Baseline:  Goal status: INITIAL  6.  *** Baseline:  Goal status: INITIAL   PLAN:  PT FREQUENCY: {rehab frequency:25116}  PT DURATION: {rehab duration:25117}  PLANNED INTERVENTIONS: {rehab planned interventions:25118::97110-Therapeutic exercises,97530- Therapeutic [phone]- Neuromuscular  re-education,97535- Self Rjmz,02859- Manual therapy,Patient/Family education}.  PLAN FOR NEXT SESSION: PIERRETTE Mliss Cummins, PT 06/15/24 4:35 PM

## 2024-06-16 ENCOUNTER — Ambulatory Visit: Admitting: Physical Therapy

## 2024-06-18 ENCOUNTER — Ambulatory Visit (HOSPITAL_BASED_OUTPATIENT_CLINIC_OR_DEPARTMENT_OTHER)

## 2024-06-18 ENCOUNTER — Other Ambulatory Visit: Payer: Self-pay | Admitting: *Deleted

## 2024-06-18 ENCOUNTER — Ambulatory Visit: Attending: Obstetrics and Gynecology | Admitting: Maternal & Fetal Medicine

## 2024-06-18 ENCOUNTER — Ambulatory Visit

## 2024-06-18 DIAGNOSIS — E119 Type 2 diabetes mellitus without complications: Secondary | ICD-10-CM

## 2024-06-18 DIAGNOSIS — Z8759 Personal history of other complications of pregnancy, childbirth and the puerperium: Secondary | ICD-10-CM

## 2024-06-18 DIAGNOSIS — A515 Early syphilis, latent: Secondary | ICD-10-CM

## 2024-06-18 DIAGNOSIS — O24112 Pre-existing diabetes mellitus, type 2, in pregnancy, second trimester: Secondary | ICD-10-CM | POA: Diagnosis not present

## 2024-06-18 DIAGNOSIS — Z794 Long term (current) use of insulin: Secondary | ICD-10-CM | POA: Diagnosis not present

## 2024-06-18 DIAGNOSIS — I10 Essential (primary) hypertension: Secondary | ICD-10-CM

## 2024-06-18 DIAGNOSIS — Z3A19 19 weeks gestation of pregnancy: Secondary | ICD-10-CM | POA: Insufficient documentation

## 2024-06-18 DIAGNOSIS — O30042 Twin pregnancy, dichorionic/diamniotic, second trimester: Secondary | ICD-10-CM | POA: Diagnosis not present

## 2024-06-18 DIAGNOSIS — O09212 Supervision of pregnancy with history of pre-term labor, second trimester: Secondary | ICD-10-CM | POA: Insufficient documentation

## 2024-06-18 DIAGNOSIS — Z363 Encounter for antenatal screening for malformations: Secondary | ICD-10-CM | POA: Insufficient documentation

## 2024-06-18 DIAGNOSIS — O34219 Maternal care for unspecified type scar from previous cesarean delivery: Secondary | ICD-10-CM | POA: Diagnosis not present

## 2024-06-18 DIAGNOSIS — O10012 Pre-existing essential hypertension complicating pregnancy, second trimester: Secondary | ICD-10-CM | POA: Insufficient documentation

## 2024-06-18 DIAGNOSIS — O99212 Obesity complicating pregnancy, second trimester: Secondary | ICD-10-CM | POA: Insufficient documentation

## 2024-06-18 DIAGNOSIS — O09292 Supervision of pregnancy with other poor reproductive or obstetric history, second trimester: Secondary | ICD-10-CM | POA: Insufficient documentation

## 2024-06-18 DIAGNOSIS — O09899 Supervision of other high risk pregnancies, unspecified trimester: Secondary | ICD-10-CM

## 2024-06-18 DIAGNOSIS — Z8619 Personal history of other infectious and parasitic diseases: Secondary | ICD-10-CM | POA: Diagnosis not present

## 2024-06-18 DIAGNOSIS — E1169 Type 2 diabetes mellitus with other specified complication: Secondary | ICD-10-CM

## 2024-06-18 DIAGNOSIS — Z361 Encounter for antenatal screening for raised alphafetoprotein level: Secondary | ICD-10-CM | POA: Insufficient documentation

## 2024-06-18 DIAGNOSIS — O99210 Obesity complicating pregnancy, unspecified trimester: Secondary | ICD-10-CM

## 2024-06-18 DIAGNOSIS — Z79899 Other long term (current) drug therapy: Secondary | ICD-10-CM | POA: Insufficient documentation

## 2024-06-18 DIAGNOSIS — O280 Abnormal hematological finding on antenatal screening of mother: Secondary | ICD-10-CM

## 2024-06-18 DIAGNOSIS — O10919 Unspecified pre-existing hypertension complicating pregnancy, unspecified trimester: Secondary | ICD-10-CM

## 2024-06-18 DIAGNOSIS — Z98891 History of uterine scar from previous surgery: Secondary | ICD-10-CM

## 2024-06-18 DIAGNOSIS — E669 Obesity, unspecified: Secondary | ICD-10-CM

## 2024-06-18 DIAGNOSIS — Z8751 Personal history of pre-term labor: Secondary | ICD-10-CM

## 2024-06-18 DIAGNOSIS — Z7982 Long term (current) use of aspirin: Secondary | ICD-10-CM | POA: Insufficient documentation

## 2024-06-18 DIAGNOSIS — O0990 Supervision of high risk pregnancy, unspecified, unspecified trimester: Secondary | ICD-10-CM

## 2024-06-18 NOTE — Progress Notes (Signed)
 [facility] at [facility] [address] Phone:  [phone]   Fax:  [phone]      In-Person Genetic Counseling Clinic Note:   I spoke with 33 y.o. Misty [name] today to discuss her elevated msAFP results. She was referred by Nicholaus Burnard HERO, MD. She was accompanied by FOB Dusty.   Pregnancy History:    [id number]. EGA: [redacted]w[redacted]d by US . EDD: 11/06/2024. This is a twin pregnancy. [name] has two healthy daughters. She had one SAB of unknown etiology. Denies major personal health concerns. Denies bleeding, infections, and fevers in this pregnancy. Denies using tobacco, alcohol, or street drugs in this pregnancy.   Family History:    A three-generation pedigree was created and scanned into Epic under the Media tab.  Patient reports her brother has sickle cell disease. [name] had negative carrier screening for sickle cell; however, without review of reports it is possible that another hemoglobinopathy may be present and not included on the currently available carrier screening. She is encouraged to reach out with clarification or test reports if they become available.    Patient ethnicity reported as Black and FOB ethnicity reported as Black. Denies Ashkenazi Jewish ancestry.  Family history not remarkable for consanguinity, individuals with birth defects, intellectual disability, autism spectrum disorder, multiple spontaneous abortions, still births, or unexplained neonatal death.   Elevated msAFP:   Maternal Serum AFP (msAFP) is a maternal blood test that measures maternal serum AFP levels to determine if a pregnancy is at higher risk for certain birth defects; however, it cannot diagnose or rule out these conditions.  msAFP screening can identify approximately 80% of open neural tube defects (ONTDs). Increased msAFP levels can be associated with neural tube defects such as spina bifida and anencephaly, abdominal wall defects,  certain genetic conditions, inaccurate pregnancy dating, presence of twins, pregnancy complications, and normal variation.  [name] had a msAFP level of 3.7 MoMs indicating elevated AFP in her bloodstream. Based on this result, the risk for an open neural tube defect is 1 in 110. We reviewed the positive screen is dependent on certain factors, such as race, twin gestation, and maternal diabetes.  We reviewed available options including anatomy ultrasounds and amniocentesis as well as their benefits, risks, and limitations. We reviewed that the anatomy ultrasound can detect most types of neural tube defects. [name] had an anatomy ultrasound performed today that was normal. She declined amniocentesis.  Moreover, unexplained elevations of msAFP have been associated with adverse pregnancy outcomes such as low birth weight, preterm labor, pre-eclampsia, or fetal demise. For this reason, growth ultrasounds are recommended.  We discussed that [name] has the option of continuing to monitor the pregnancy via routine ultrasounds with the understanding that a normal ultrasound does not guarantee a healthy pregnancy.   Newborn Screening. The [place]  Newborn Screening (NBS) program will screen all newborn babies for cystic fibrosis, spinal muscular atrophy, hemoglobinopathies, and numerous other conditions.  Previous Testing Completed:  Low risk NIPS: Jamieson previously completed [facility] noninvasive prenatal screening (NIPS) in this pregnancy. The result is low risk, consistent with dizygotic female and female twins. This screening significantly reduces but does not eliminate the chance that the current pregnancy has Down syndrome (trisomy 46), trisomy 97, and trisomy 45. Please see report for details. There are many genetic conditions that cannot be detected by NIPS.   Negative carrier screening: [name] previously completed [facility] carrier screening. She screened to not be a carrier  for cystic fibrosis  (CF), spinal muscular atrophy (SMA), alpha thalassemia, and beta hemoglobinopathies. Please see report for details. A negative result on carrier screening reduces but does not eliminate the chance of being a carrier.    Plan of Care:   Declined amniocentesis. Follow-up growth ultrasounds to be scheduled at [facility].   Informed consent was obtained. All questions were answered.   35 minutes were spent on the date of the encounter in service to the patient including preparation, face-to-face consultation, discussion of test reports and available next steps, pedigree construction, genetic risk assessment, documentation, and care coordination.    Thank you for sharing in the care of Misty [name] with us .  Please do not hesitate to contact us  at [phone] if you have any questions.   Lauraine Bodily, MS, [facility] Certified Genetic Counselor   Genetic counseling student involved in appointment: No.

## 2024-06-18 NOTE — Progress Notes (Signed)
 Patient information  Patient Name: Misty [name]  Patient MRN:   [id number]  Referring practice: MFM Referring Provider: [facility])  Problem List   Patient Active Problem List   Diagnosis Date Noted   Elevated AFP 05/21/2024   Rubella non-immune status, antepartum 05/07/2024   Short interval between pregnancies affecting pregnancy, antepartum 05/05/2024   Supervision of high risk pregnancy, antepartum 04/30/2024   Twin pregnancy, twins dichorionic and diamniotic 04/30/2024   History of maternal syphilis, 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   History of cesarean section 10/28/2023   Chronic hypertension 10/26/2023   History of preterm delivery 05/21/2023   Type 2 diabetes mellitus (HCC) 01/08/2023   History of severe pre-eclampsia 06/17/2021   Marijuana use 05/24/2021   Chronic hypertension affecting pregnancy 09/2013    Maternal Fetal Medicine Consult Misty [name] is a 33 y.o. [id number] at [redacted]w[redacted]d here for ultrasound and consultation.   Today we focused on the following:   Elevated AFP: GC to follow todays visit.   RE dichorionic/diamniotic twins: on today's ultrasound there is evidence of the lambda sign suggesting dichorionic/diamniotic twin pregnancy.  The placental's are also separate. Fetal sexes: female and female.  Cell free DNA aneuploidy screening showed dizygotic twins also known as non-identical or fraternal twins.  I discussed the diagnosis, management and potential complications of dichorionic/diamniotic (DC/DA) twins in pregnancy.  While most DC/DA twins have a favorable clinical outcome, this type of pregnancy is associated with an increased risk of obstetric, fetal and neonatal risks associated with this type of pregnancy. These risk include but are not limited to preterm birth/early delivery (34.5% twins versus 6.3% in singletons), hyperemesis, gestational diabetes mellitus, hypertensive disorders of pregnancy (6.5% singleton vs 12.7%  twins), anemia, hemorrhage, placental abruption (4.7% twins versus 0.7% singletons), cesarean delivery, and postpartum depression. Twins born preterm (less than 32 weeks of gestation) are at twice the risk of a high-grade intraventricular hemorrhage and periventricular leukomalacia and cerebral palsy when compared with singletons of the same gestational age. Preeclampsia occurs more frequently and at earlier gestation, likely due to the increase in placental mass and the associated biochemical markers produced by the placenta that are thought to lead to preeclampsia.  For this reason 81-162 mg of aspirin  should be taken as a means to reduce the risk of preeclampsia starting around 33 weeks continued throughout the pregnancy. Congenital heart disease is not increased in dichorionic pregnancy and therefore a fetal echo is not indicated unless other indications are present. Duet to the association with fetal growth restriction and inability to properly measure fundal height, fetal biometry should be done ever 3-4 weeks or sooner if indicated.  The risk of stillbirth increases slightly in the third trimester after [redacted] weeks gestation and antenatal testing should be done around this time or earlier if other indications are present.  Delivery timing is around 37 to 38 weeks or sooner pending the clinical course.  Route of delivery depends on the presentation of twin A, the percentage of discordance between each fetus as well as the overall gestational age and estimated fetal weight. If twin A is cephalic, twin be   The patient verbalized understanding and is agreeable to the plan outlined below.   Short interval pregnancy: I discussed that this as well as a twin pregnancy are both risk factors for preterm birth.  However, the cervix appears normal today.  There are no recommendations for progesterone or cerclage in this setting.  Chronic hypertension with a  history of preeclampsia: Blood pressure is normal today,  compliant with aspirin .  I discussed the clinical applications and importance of proper blood pressure control during pregnancy and the need to monitor for signs and symptoms of preeclampsia.  Type 2 diabetes in pregnancy: We discussed the clinical implications of type 2 diabetes and the importance of proper glycemic control to aid limit adverse outcomes.  The patient reports good glucose values and will follow-up with her OB provider as needed.  She is compliant with her insulin  therapy.  Sonographic findings Single intrauterine pregnancy at 19w 6d  Fetal cardiac activity:  A: Observed, B: Observed and appears normal. Presentation: A: Cephalic, B: Cephalic. The anatomic structures that were well seen appear normal without evidence of soft markers. Due to poor acoustic windows some structures remain suboptimally visualized. Fetal biometry shows the estimated fetal weight at the A: 25, B: 6 percentile.  Amniotic fluid: A: Within normal limits, B: Within normal limits.  MVP: A: 5.49, B: 7.12 cm. Placenta: A: Anterior, B: Posterior. Adnexa: No abnormality visualized. Cervical length: 3.1 cm.  There are limitations of prenatal ultrasound such as the inability to detect certain abnormalities due to poor visualization. Various factors such as fetal position, gestational age and maternal body habitus may increase the difficulty in visualizing the fetal anatomy.    Recommendations - EDD should be 11/06/2024 based on  Early Ultrasound  (04/25/24). - Fetal echos ordered. - Continue diabetic care. - GC to discuss elevated AFP - Detailed anatomy US  was done today without evidence of structural anomalies. - Serial growth US  every 4-6 weeks. - Antenatal testing to start around 34 weeks or sooner if indicated. - Delivery around 37 weeks or sooner if indicated. - Route of delivery will depending on various factors such as previous cesarean delivery, patient preference, presentation of twin A, growth  discordance, overall gestational age and fetal weight at the time of delivery.  Future visits will encompass counseling regarding the timing and route of delivery. - Low dose aspirin  (81 mg/day) prophylaxis is recommended and should be initiated between 12 and 28 weeks of gestation (optimally before 16 weeks of gestation) and continued daily until delivery  Review of Systems: A review of systems was performed and was negative except per HPI   Past Obstetrical History:  OB History  Gravida Para Term Preterm AB Living  4 2 1 1 1 2   SAB IAB Ectopic Multiple Live Births  1 0 0 0 2    # Outcome Date GA Lbr Len/2nd Weight Sex Type Anes PTL Lv  4 Current           3 Term 10/27/23 [redacted]w[redacted]d  5 lb 13.1 oz (2.64 kg) F CS-LTranv EPI  LIV  2 SAB 09/05/22          1 Preterm 06/22/21 [redacted]w[redacted]d 14:20 / 00:53 4 lb 10.1 oz (2.1 kg) F Vag-Spont EPI  LIV    Obstetric Comments  2022  BP was high  2025 c/s: fetal heart was dropping, BP was sky high     Past Medical History:  Past Medical History:  Diagnosis Date   Abscess 09/02/2020   Allergy    Anemia 2015   Asthma    as child   Chronic hypertension with superimposed preeclampsia 05/24/2021   Diabetes mellitus without complication (HCC) 2015   DKA (diabetic ketoacidosis) (HCC) 09/02/2020   Eczema    History of pre-eclampsia    Hypertension 09/2013   Trichomonas infection 04/2023   UTI (urinary  tract infection)      Past Surgical History:    Past Surgical History:  Procedure Laterality Date   Abcess on back     CESAREAN SECTION N/A 10/27/2023   Procedure: CESAREAN SECTION;  Surgeon: Zina Jerilynn LABOR, MD;  Location: [facility] LD ORS;  Service: Obstetrics;  Laterality: N/A;     Home Medications:   Current Outpatient Medications on File Prior to Visit  Medication Sig Dispense Refill   acetaminophen  (TYLENOL ) 500 MG tablet Take 2 tablets (1,000 mg total) by mouth every 6 (six) hours as needed for mild pain (pain score 1-3). 100 tablet 2   aspirin  EC 81  MG tablet Take 2 tablets (162 mg total) by mouth daily. Take after 12 weeks for prevention of preeclampsia later in pregnancy 300 tablet 2   Blood Glucose Monitoring Suppl DEVI 1 each by Does not apply route in the morning, at noon, and at bedtime. May substitute to any manufacturer covered by patient's insurance. 1 each 0   Blood Pressure Monitoring (BLOOD PRESSURE MONITOR AUTOMAT) DEVI 1 each by Does not apply route daily. (Patient not taking: Reported on 05/19/2024) 1 each 0   Continuous Glucose Sensor (DEXCOM G7 SENSOR) MISC Place one monitor onto the skin. Change every ten days (Patient not taking: Reported on 05/19/2024) 3 each 7   cyclobenzaprine  (FLEXERIL ) 5 MG tablet Take 1 tablet (5 mg total) by mouth 3 (three) times daily as needed for muscle spasms. 30 tablet 0   fluticasone  (FLONASE ) 50 MCG/ACT nasal spray Place 2 sprays into both nostrils daily. 16 g 0   Insulin  Glargine (BASAGLAR  KWIKPEN) 100 UNIT/ML Inject 20 Units into the skin 2 (two) times daily. 15 mL 3   NIFEdipine  (ADALAT  CC) 60 MG 24 hr tablet Take 1 tablet (60 mg total) by mouth daily. 120 tablet 2   nitrofurantoin, macrocrystal-monohydrate, (MACROBID) 100 MG capsule Take 1 capsule (100 mg total) by mouth 2 (two) times daily. 14 capsule 0   ondansetron  (ZOFRAN ) 4 MG tablet Take 1 tablet (4 mg total) by mouth every 8 (eight) hours as needed for nausea or vomiting. 30 tablet 2   Prenatal Vit-Fe Fumarate-FA (WESTAB PLUS) 27-1 MG TABS TAKE 1 TABLET BY MOUTH DAILY 30 tablet 11   No current facility-administered medications on file prior to visit.      Allergies:   Allergies  Allergen Reactions   Iodides Anaphylaxis and Itching   Morphine  And Codeine Anaphylaxis and Itching   Shellfish Allergy Anaphylaxis and Itching    Pt reports watery eyes   Ultram  [Tramadol  Hcl] Hives    Hives and swollen lips      Physical Exam:   There were no vitals filed for this visit. Sitting comfortably on the sonogram table Nonlabored  breathing Normal rate and rhythm Abdomen is nontender  Thank you for the opportunity to be involved with this patient's care. Please let us  know if we can be of any further assistance.   60 minutes of time was spent reviewing the patient's chart including labs, imaging and documentation.  At least 50% of this time was spent with direct patient care discussing the diagnosis, management and prognosis of her care.  Delora Smaller MFM, [place]   06/18/2024  12:20 PM

## 2024-06-24 ENCOUNTER — Ambulatory Visit: Payer: Self-pay

## 2024-06-26 DIAGNOSIS — O99210 Obesity complicating pregnancy, unspecified trimester: Secondary | ICD-10-CM | POA: Insufficient documentation

## 2024-06-30 ENCOUNTER — Encounter: Payer: Self-pay | Admitting: Obstetrics and Gynecology

## 2024-06-30 ENCOUNTER — Ambulatory Visit (INDEPENDENT_AMBULATORY_CARE_PROVIDER_SITE_OTHER): Admitting: Obstetrics and Gynecology

## 2024-06-30 ENCOUNTER — Telehealth: Payer: Self-pay | Admitting: Lactation Services

## 2024-06-30 ENCOUNTER — Other Ambulatory Visit: Payer: Self-pay

## 2024-06-30 VITALS — BP 125/89 | HR 89 | Wt 219.0 lb

## 2024-06-30 DIAGNOSIS — O2342 Unspecified infection of urinary tract in pregnancy, second trimester: Secondary | ICD-10-CM | POA: Diagnosis not present

## 2024-06-30 DIAGNOSIS — Z8751 Personal history of pre-term labor: Secondary | ICD-10-CM | POA: Diagnosis not present

## 2024-06-30 DIAGNOSIS — O99212 Obesity complicating pregnancy, second trimester: Secondary | ICD-10-CM | POA: Diagnosis not present

## 2024-06-30 DIAGNOSIS — O10912 Unspecified pre-existing hypertension complicating pregnancy, second trimester: Secondary | ICD-10-CM

## 2024-06-30 DIAGNOSIS — O365990 Maternal care for other known or suspected poor fetal growth, unspecified trimester, not applicable or unspecified: Secondary | ICD-10-CM | POA: Insufficient documentation

## 2024-06-30 DIAGNOSIS — O30042 Twin pregnancy, dichorionic/diamniotic, second trimester: Secondary | ICD-10-CM | POA: Diagnosis not present

## 2024-06-30 DIAGNOSIS — O365920 Maternal care for other known or suspected poor fetal growth, second trimester, not applicable or unspecified: Secondary | ICD-10-CM

## 2024-06-30 DIAGNOSIS — O99210 Obesity complicating pregnancy, unspecified trimester: Secondary | ICD-10-CM

## 2024-06-30 DIAGNOSIS — O2340 Unspecified infection of urinary tract in pregnancy, unspecified trimester: Secondary | ICD-10-CM

## 2024-06-30 DIAGNOSIS — Z2839 Other underimmunization status: Secondary | ICD-10-CM

## 2024-06-30 DIAGNOSIS — Z98891 History of uterine scar from previous surgery: Secondary | ICD-10-CM

## 2024-06-30 DIAGNOSIS — Z23 Encounter for immunization: Secondary | ICD-10-CM

## 2024-06-30 DIAGNOSIS — O24112 Pre-existing diabetes mellitus, type 2, in pregnancy, second trimester: Secondary | ICD-10-CM

## 2024-06-30 DIAGNOSIS — O280 Abnormal hematological finding on antenatal screening of mother: Secondary | ICD-10-CM

## 2024-06-30 DIAGNOSIS — O0992 Supervision of high risk pregnancy, unspecified, second trimester: Secondary | ICD-10-CM

## 2024-06-30 DIAGNOSIS — Z3A21 21 weeks gestation of pregnancy: Secondary | ICD-10-CM

## 2024-06-30 DIAGNOSIS — O0990 Supervision of high risk pregnancy, unspecified, unspecified trimester: Secondary | ICD-10-CM

## 2024-06-30 DIAGNOSIS — O10919 Unspecified pre-existing hypertension complicating pregnancy, unspecified trimester: Secondary | ICD-10-CM

## 2024-06-30 DIAGNOSIS — Z8759 Personal history of other complications of pregnancy, childbirth and the puerperium: Secondary | ICD-10-CM

## 2024-06-30 DIAGNOSIS — O09899 Supervision of other high risk pregnancies, unspecified trimester: Secondary | ICD-10-CM

## 2024-06-30 MED ORDER — BASAGLAR KWIKPEN 100 UNIT/ML ~~LOC~~ SOPN: 24.0000 [IU] | PEN_INJECTOR | Freq: Two times a day (BID) | SUBCUTANEOUS | 3 refills | Status: DC

## 2024-06-30 MED ORDER — SCOPOLAMINE 1 MG/3DAYS TD PT72: 1.0000 | MEDICATED_PATCH | TRANSDERMAL | 1 refills | Status: DC

## 2024-06-30 MED ORDER — ONDANSETRON 4 MG PO TBDP: 4.0000 mg | ORAL_TABLET | Freq: Three times a day (TID) | ORAL | 2 refills | Status: DC | PRN

## 2024-06-30 NOTE — Telephone Encounter (Signed)
 Misty [name] (KeyBETHA HEMMING) PA Case ID #: [id number] Rx #: [id number] Need Help? Call us  at [phone] Outcome Approved today by [facility]  Medicaid PA Case: [id number], Status: Approved, Coverage Starts on: 06/30/2024 12:00:00 AM, Coverage Ends on: 12/27/2024 12:00:00 AM. Effective Date: 06/30/2024 Authorization Expiration Date: 12/27/2024 Drug Dexcom G7 Sensor ePA cloud logo Form [employer] [place]  Medicaid Electronic PA Form (2017 NCPDP) Original Claim Info 73  Called [facility] to inform them of approved PA. It has already been run. Called patient to inform her of approval. LM that medication was approved and to contact pharmacy.

## 2024-06-30 NOTE — Telephone Encounter (Signed)
 PA sent through Covermymeds for Dexcom G7, awaiting determination.   Misty [name] (KeyBETHA HEMMING) PA Case ID #: [id number] Rx #: [id number] Need Help? Call us  at [phone] Status sent iconSent to Plan today Drug Dexcom G7 Sensor ePA cloud logo Form [employer] [place]  Medicaid Electronic PA Form [phone] NCPDP) Original Claim Info 16

## 2024-06-30 NOTE — Progress Notes (Signed)
 PRENATAL VISIT NOTE  Subjective:  Misty [name] is a 33 y.o. [phone] at 106w4d being seen today for ongoing prenatal care.  She is currently monitored for the following issues for this high-risk pregnancy and has Chronic hypertension affecting pregnancy; Marijuana use; History of severe pre-eclampsia; Type 2 diabetes mellitus affecting pregnancy in second trimester, antepartum; History of preterm delivery; History of cesarean section; History of maternal syphilis, currently pregnant; Supervision of high risk pregnancy, antepartum; Twin pregnancy, twins dichorionic and diamniotic; Short interval between pregnancies affecting pregnancy, antepartum; Rubella non-immune status, antepartum; Abnormal MSAFP (maternal serum alpha-fetoprotein), elevated; Obesity affecting pregnancy, antepartum; and Fetal growth restriction antepartum on their problem list.  Patient reports occasional contractions.  Contractions: Irritability. Vag. Bleeding: None.  Movement: Present. Denies leaking of fluid.   The following portions of the patient's history were reviewed and updated as appropriate: allergies, current medications, past family history, past medical history, past social history, past surgical history and problem list.   Objective:    Vitals:   06/30/24 1544  BP: 125/89  Pulse: 89  Weight: 219 lb (99.3 kg)    Fetal Status:  Fetal Heart Rate (bpm): 152/134   Movement: Present    General: Alert, oriented and cooperative. Patient is in no acute distress.  Skin: Skin is warm and dry. No rash noted.   Cardiovascular: Normal heart rate noted  Respiratory: Normal respiratory effort, no problems with respiration noted  Abdomen: Soft, gravid, appropriate for gestational age.  Pain/Pressure: Absent     Pelvic: Cervical exam deferred        Extremities: Normal range of motion.  Edema: Trace (bilateral LE)  Mental Status: Normal mood and affect. Normal behavior. Normal judgment and thought content.    Assessment and Plan:  Pregnancy: [id number] at [redacted]w[redacted]d  1. Supervision of high risk pregnancy, antepartum (Primary)  2. History of maternal syphilis, currently pregnant Titers increased by 1 fold, not considered new infection or treatment failure as not 4 fold increase in titer per CDC 2021 STI treatment guidelines  3. History of preterm delivery  4. History of cesarean section Would like a RCS  5. History of severe pre-eclampsia  6. Type 2 diabetes mellitus affecting pregnancy in second trimester, antepartum Has dexcom On basaglar  20 units BID Fasting 120-140s, though patient does note she eats in the middle of the night Pp 120s up to 200s Increase basaglar  to 24 units BID and keep log of fasting and pp as she has dexcom and cannot pull data   7. Chronic hypertension affecting pregnancy On nifedipine  60 mg daily Cont baby aspirin   8. Dichorionic diamniotic twin pregnancy in second trimester  9. Rubella non-immune status, antepartum MMR pp  10. Short interval between pregnancies affecting pregnancy, antepartum  11. Abnormal MSAFP (maternal serum alpha-fetoprotein), elevated Seen MFM  12. Obesity affecting pregnancy, antepartum, unspecified obesity type Cont baby aspirin   13. Flu vaccine need - Flu vaccine trivalent PF, 6mos and older(Flulaval,Afluria,Fluarix,Fluzone)  14. Urinary tract infection in mother during pregnancy, antepartum - Culture, OB Urine  15. Pre-existing type 2 diabetes mellitus, in pregnancy, second trimester - Insulin  Glargine (BASAGLAR  KWIKPEN) 100 UNIT/ML; Inject 24 Units into the skin 2 (two) times daily.  Dispense: 15 mL; Refill: 3  16. Fetal growth restriction antepartum Twin B 6th%tile, has f/u   Preterm labor symptoms and general obstetric precautions including but not limited to vaginal bleeding, contractions, leaking of fluid and fetal movement were reviewed in detail with the patient. Please refer to After Visit Summary  for other  counseling recommendations.   Return in about 2 weeks (around 07/14/2024) for high OB.  Future Appointments  Date Time Provider Department Center  07/10/2024  2:15 PM [facility] PROVIDER 1 [facility]  07/10/2024  2:30 PM [facility] US3 [facility]    Burnard CHRISTELLA Moats, MD

## 2024-07-01 ENCOUNTER — Encounter

## 2024-07-02 LAB — CULTURE, OB URINE

## 2024-07-02 LAB — URINE CULTURE, OB REFLEX

## 2024-07-04 ENCOUNTER — Ambulatory Visit: Payer: Self-pay | Admitting: Obstetrics and Gynecology

## 2024-07-10 ENCOUNTER — Ambulatory Visit

## 2024-07-10 ENCOUNTER — Ambulatory Visit: Attending: Obstetrics | Admitting: Obstetrics

## 2024-07-10 VITALS — BP 143/73 | HR 74

## 2024-07-10 DIAGNOSIS — E1169 Type 2 diabetes mellitus with other specified complication: Secondary | ICD-10-CM | POA: Diagnosis present

## 2024-07-10 DIAGNOSIS — E119 Type 2 diabetes mellitus without complications: Secondary | ICD-10-CM

## 2024-07-10 DIAGNOSIS — Z8751 Personal history of pre-term labor: Secondary | ICD-10-CM | POA: Diagnosis not present

## 2024-07-10 DIAGNOSIS — O99210 Obesity complicating pregnancy, unspecified trimester: Secondary | ICD-10-CM

## 2024-07-10 DIAGNOSIS — O10012 Pre-existing essential hypertension complicating pregnancy, second trimester: Secondary | ICD-10-CM | POA: Insufficient documentation

## 2024-07-10 DIAGNOSIS — Z8759 Personal history of other complications of pregnancy, childbirth and the puerperium: Secondary | ICD-10-CM | POA: Diagnosis not present

## 2024-07-10 DIAGNOSIS — O24112 Pre-existing diabetes mellitus, type 2, in pregnancy, second trimester: Secondary | ICD-10-CM

## 2024-07-10 DIAGNOSIS — O280 Abnormal hematological finding on antenatal screening of mother: Secondary | ICD-10-CM | POA: Diagnosis not present

## 2024-07-10 DIAGNOSIS — Z3A23 23 weeks gestation of pregnancy: Secondary | ICD-10-CM | POA: Diagnosis not present

## 2024-07-10 DIAGNOSIS — Z794 Long term (current) use of insulin: Secondary | ICD-10-CM | POA: Insufficient documentation

## 2024-07-10 DIAGNOSIS — O09293 Supervision of pregnancy with other poor reproductive or obstetric history, third trimester: Secondary | ICD-10-CM | POA: Insufficient documentation

## 2024-07-10 DIAGNOSIS — O365922 Maternal care for other known or suspected poor fetal growth, second trimester, fetus 2: Secondary | ICD-10-CM

## 2024-07-10 DIAGNOSIS — O09892 Supervision of other high risk pregnancies, second trimester: Secondary | ICD-10-CM | POA: Diagnosis not present

## 2024-07-10 DIAGNOSIS — O09899 Supervision of other high risk pregnancies, unspecified trimester: Secondary | ICD-10-CM

## 2024-07-10 DIAGNOSIS — O99212 Obesity complicating pregnancy, second trimester: Secondary | ICD-10-CM | POA: Insufficient documentation

## 2024-07-10 DIAGNOSIS — O365990 Maternal care for other known or suspected poor fetal growth, unspecified trimester, not applicable or unspecified: Secondary | ICD-10-CM

## 2024-07-10 DIAGNOSIS — O09213 Supervision of pregnancy with history of pre-term labor, third trimester: Secondary | ICD-10-CM | POA: Insufficient documentation

## 2024-07-10 DIAGNOSIS — O30042 Twin pregnancy, dichorionic/diamniotic, second trimester: Secondary | ICD-10-CM

## 2024-07-10 DIAGNOSIS — Z362 Encounter for other antenatal screening follow-up: Secondary | ICD-10-CM | POA: Diagnosis not present

## 2024-07-10 DIAGNOSIS — O10919 Unspecified pre-existing hypertension complicating pregnancy, unspecified trimester: Secondary | ICD-10-CM

## 2024-07-10 DIAGNOSIS — O34219 Maternal care for unspecified type scar from previous cesarean delivery: Secondary | ICD-10-CM | POA: Insufficient documentation

## 2024-07-10 DIAGNOSIS — Z98891 History of uterine scar from previous surgery: Secondary | ICD-10-CM

## 2024-07-10 DIAGNOSIS — O0990 Supervision of high risk pregnancy, unspecified, unspecified trimester: Secondary | ICD-10-CM

## 2024-07-10 NOTE — Progress Notes (Signed)
 MFM Consult Note  Katelyn Granlund is currently at 23 weeks and 0 days.  She has been followed due to a dichorionic, diamniotic twin gestation.    Her pregnancy has also been complicated by an unexplained elevated MSAFP of 3.70 MoM, maternal obesity with a BMI of 38, chronic hypertension, and pregestational diabetes treated with insulin .  This is a short interval pregnancy.    The patient denies any problems since her last exam.    She reports that her blood glucose values continue to be elevated.  Her blood pressures today were 143/73 and 104/83.  Twin A: EFW 1 pound 2 ounces (17th percentile for her gestational age).  Normal amniotic fluid.  Doppler studies of the umbilical arteries showed continued normal forward flow.  There were no signs of absent or reversed end-diastolic flow noted.    Twin B: EFW 1 pound (7th percentile for her gestational age, indicating IUGR).  Normal amniotic fluid.  Doppler studies of the umbilical arteries showed continued forward flow, with an elevated S/D ratio of 7.56. There were no signs of absent or reversed end-diastolic flow noted.    Elevated MSAFP 3.70 MoM  She was advised that there were no sonographic signs of spina bifida or an anterior abdominal wall defect noted today in either fetus to account for the elevated MSAFP.    The possibility of placental dysfunction as a cause of the elevated MSAFP level and may be reflected as IUGR of twin B with abnormal umbilical artery Doppler studies was discussed.  The increased risk of adverse pregnancy outcomes such as a fetal demise, an indicated preterm birth, and IUGR due to the elevated MSAFP level was discussed.  The increased risk of superimposed preeclampsia related to placental dysfunction and her history of chronic hypertension was discussed.  She was advised to continue taking a daily baby aspirin  for preeclampsia prophylaxis.  IUGR of twin B with elevated umbilical artery Doppler studies  We will  continue to follow her closely with weekly umbilical artery Doppler studies to assess for signs of absent or reversed end-diastolic flow.  She understands that an indicated preterm birth may be necessary should the fetal status be nonreassuring or should there be abnormal umbilical artery Doppler studies noted later in her pregnancy.  Pregestational diabetes  She was advised to continue using insulin  to achieve the best glycemic control possible.    She understands that maintaining normal glycemic control will enable her to achieve the most optimal pregnancy outcome.  Her insulin  dosage may need to be increased to help her achieve better glycemic control.  She reports that she has a fetal echocardiogram scheduled with [facility] pediatric cardiology next month.  She will return in 1 week for another umbilical artery Doppler study and fluid check.  The patient stated that all of her questions were answered today and she stated that she understood the significance of what was discussed with her today.    Fetal kick count instructions were reviewed.  A total of 30 minutes was spent counseling and coordinating the care for this patient.  Greater than 50% of the time was spent in direct face-to-face contact.

## 2024-07-13 ENCOUNTER — Ambulatory Visit

## 2024-07-13 ENCOUNTER — Other Ambulatory Visit: Payer: Self-pay | Admitting: *Deleted

## 2024-07-13 DIAGNOSIS — O24112 Pre-existing diabetes mellitus, type 2, in pregnancy, second trimester: Secondary | ICD-10-CM

## 2024-07-13 DIAGNOSIS — O30042 Twin pregnancy, dichorionic/diamniotic, second trimester: Secondary | ICD-10-CM

## 2024-07-13 DIAGNOSIS — O365990 Maternal care for other known or suspected poor fetal growth, unspecified trimester, not applicable or unspecified: Secondary | ICD-10-CM

## 2024-07-15 ENCOUNTER — Other Ambulatory Visit: Payer: Self-pay | Admitting: Lactation Services

## 2024-07-16 ENCOUNTER — Encounter: Admitting: Obstetrics & Gynecology

## 2024-07-17 ENCOUNTER — Ambulatory Visit (INDEPENDENT_AMBULATORY_CARE_PROVIDER_SITE_OTHER): Admitting: Obstetrics & Gynecology

## 2024-07-17 ENCOUNTER — Ambulatory Visit: Attending: Obstetrics and Gynecology

## 2024-07-17 ENCOUNTER — Encounter: Payer: Self-pay | Admitting: Obstetrics & Gynecology

## 2024-07-17 ENCOUNTER — Other Ambulatory Visit: Payer: Self-pay

## 2024-07-17 ENCOUNTER — Ambulatory Visit

## 2024-07-17 VITALS — BP 116/82 | HR 97 | Wt 221.3 lb

## 2024-07-17 DIAGNOSIS — O365990 Maternal care for other known or suspected poor fetal growth, unspecified trimester, not applicable or unspecified: Secondary | ICD-10-CM | POA: Diagnosis not present

## 2024-07-17 DIAGNOSIS — O365922 Maternal care for other known or suspected poor fetal growth, second trimester, fetus 2: Secondary | ICD-10-CM

## 2024-07-17 DIAGNOSIS — Z3A24 24 weeks gestation of pregnancy: Secondary | ICD-10-CM

## 2024-07-17 DIAGNOSIS — O280 Abnormal hematological finding on antenatal screening of mother: Secondary | ICD-10-CM

## 2024-07-17 DIAGNOSIS — O30042 Twin pregnancy, dichorionic/diamniotic, second trimester: Secondary | ICD-10-CM | POA: Insufficient documentation

## 2024-07-17 DIAGNOSIS — O24112 Pre-existing diabetes mellitus, type 2, in pregnancy, second trimester: Secondary | ICD-10-CM | POA: Insufficient documentation

## 2024-07-17 DIAGNOSIS — Z98891 History of uterine scar from previous surgery: Secondary | ICD-10-CM | POA: Diagnosis not present

## 2024-07-17 DIAGNOSIS — O0992 Supervision of high risk pregnancy, unspecified, second trimester: Secondary | ICD-10-CM | POA: Diagnosis not present

## 2024-07-17 DIAGNOSIS — O365920 Maternal care for other known or suspected poor fetal growth, second trimester, not applicable or unspecified: Secondary | ICD-10-CM

## 2024-07-17 DIAGNOSIS — O0990 Supervision of high risk pregnancy, unspecified, unspecified trimester: Secondary | ICD-10-CM | POA: Insufficient documentation

## 2024-07-17 DIAGNOSIS — E119 Type 2 diabetes mellitus without complications: Secondary | ICD-10-CM

## 2024-07-17 DIAGNOSIS — O402XX2 Polyhydramnios, second trimester, fetus 2: Secondary | ICD-10-CM

## 2024-07-17 MED ORDER — BASAGLAR KWIKPEN 100 UNIT/ML ~~LOC~~ SOPN: PEN_INJECTOR | SUBCUTANEOUS | 3 refills | Status: DC

## 2024-07-17 NOTE — Progress Notes (Signed)
 MFM Consult Note  Misty [name] is currently at 24 weeks and 0 days.  She has been followed due to a dichorionic, diamniotic twin gestation with IUGR of twin B.    Her pregnancy has also been complicated by an unexplained elevated MSAFP of 3.70 MoM, maternal obesity with a BMI of 38, chronic hypertension, and pregestational diabetes treated with insulin .  This is a short interval pregnancy.    She denies any problems since her last exam and reports feeling fetal movements of both fetuses throughout the day.  Twin A:  Normal amniotic fluid.  Doppler studies of the umbilical arteries showed continued normal forward flow.  There were no signs of absent or reversed end-diastolic flow noted.    Twin B: Normal amniotic fluid.  Doppler studies of the umbilical arteries showed continued forward flow, with an elevated S/D ratio of 4.79. There were no signs of absent or reversed end-diastolic flow noted.    Vigorous fetal movements of both fetuses were noted throughout today's exam.  IUGR of twin B with elevated umbilical artery Doppler studies We will continue to follow her closely with umbilical artery Doppler studies to assess for signs of absent or reversed end-diastolic flow. She understands that an indicated preterm birth may be necessary should the fetal status be nonreassuring or should there be abnormal umbilical artery Doppler studies noted later in her pregnancy.  Pregestational diabetes She was advised to continue using insulin  to achieve the best glycemic control possible.   She understands that maintaining normal glycemic control will enable her to achieve the most optimal pregnancy outcome. Her insulin  dosage may need to be increased to help her achieve better glycemic control. She reports that she has a fetal echocardiogram scheduled with [facility] pediatric cardiology next month.  She will return in 2 weeks for another umbilical artery Doppler study and growth scan.  The patient stated  that all of her questions were answered today.   Fetal kick count instructions were reviewed.  A total of 20 minutes was spent counseling and coordinating the care for this patient.  Greater than 50% of the time was spent in direct face-to-face contact.

## 2024-07-17 NOTE — Progress Notes (Signed)
 PRENATAL VISIT NOTE  Subjective:  Misty [name] is a 33 y.o. [phone] at [redacted]w[redacted]d being seen today for ongoing prenatal care.  She is currently monitored for the following issues for this high-risk pregnancy and has Chronic hypertension affecting pregnancy; Marijuana use; History of severe pre-eclampsia; Type 2 diabetes mellitus affecting pregnancy in second trimester, antepartum; History of preterm delivery; History of cesarean section; History of maternal syphilis, currently pregnant; Supervision of high risk pregnancy, antepartum; Twin pregnancy, twins dichorionic and diamniotic; Short interval between pregnancies affecting pregnancy, antepartum; Rubella non-immune status, antepartum; Abnormal MSAFP (maternal serum alpha-fetoprotein), elevated; Obesity affecting pregnancy, antepartum; and Fetal growth restriction antepartum on their problem list.  Patient reports occasional contractions and BG is low in the morning.  Contractions: Irritability. Vag. Bleeding: None.  Movement: Present. Denies leaking of fluid.   The following portions of the patient's history were reviewed and updated as appropriate: allergies, current medications, past family history, past medical history, past social history, past surgical history and problem list.   Objective:   Vitals:   07/17/24 1129  BP: 116/82  Pulse: 97  Weight: 221 lb 4.8 oz (100.4 kg)    Fetal Status:  Fetal Heart Rate (bpm): 161/154   Movement: Present    General: Alert, oriented and cooperative. Patient is in no acute distress.  Skin: Skin is warm and dry. No rash noted.   Cardiovascular: Normal heart rate noted  Respiratory: Normal respiratory effort, no problems with respiration noted  Abdomen: Soft, gravid, appropriate for gestational age.  Pain/Pressure: Present (pressure)     Pelvic: Cervical exam deferred        Extremities: Normal range of motion.  Edema: Trace  Mental Status: Normal mood and affect. Normal behavior. Normal judgment  and thought content.      05/05/2024    5:29 PM 05/21/2023    2:23 PM 02/08/2023   11:19 AM  Depression screen PHQ 2/9  Decreased Interest 0 0 0  Down, Depressed, Hopeless 0 0 0  PHQ - 2 Score 0 0 0  Altered sleeping 0 0 0  Tired, decreased energy 0 0 0  Change in appetite 0 0 0  Feeling bad or failure about yourself  0 0 0  Trouble concentrating 0 0 0  Moving slowly or fidgety/restless 0 0 0  Suicidal thoughts 0 0 0  PHQ-9 Score 0  0  0      Data saved with a previous flowsheet row definition        05/05/2024    5:29 PM 05/21/2023    2:23 PM 02/08/2023   11:19 AM 01/08/2023    8:56 AM  GAD 7 : Generalized Anxiety Score  Nervous, Anxious, on Edge 0 0 0 0  Control/stop worrying 0 0 0 0  Worry too much - different things 0 0 0 0  Trouble relaxing 0 0 0 0  Restless 0 0 0 0  Easily annoyed or irritable 0 0 0 0  Afraid - awful might happen 0 0 0 0  Total GAD 7 Score 0 0 0 0    Assessment and Plan:  Pregnancy: [id number] at [redacted]w[redacted]d 1. Type 2 diabetes mellitus affecting pregnancy in second trimester, antepartum (Primary) FBS occasionally <70 with sx hypoglycemia and PP 120-150  2. Supervision of high risk pregnancy, antepartum   3. Dichorionic diamniotic twin pregnancy in second trimester F/u US  today  4. History of cesarean section Last delivery   5. Fetal growth restriction antepartum   6. Pre-existing  type 2 diabetes mellitus, in pregnancy, second trimester  - Insulin  Glargine (BASAGLAR  KWIKPEN) 100 UNIT/ML; 28 units Roy in the morning and 20 units at night  Dispense: 15 mL; Refill: 3  Preterm labor symptoms and general obstetric precautions including but not limited to vaginal bleeding, contractions, leaking of fluid and fetal movement were reviewed in detail with the patient. Please refer to After Visit Summary for other counseling recommendations.   Return in about 4 weeks (around 08/14/2024).  Future Appointments  Date Time Provider Department Center  07/24/2024   9:15 AM [facility] PROVIDER 1 [facility]  07/24/2024  9:30 AM [facility] US1 [facility]  08/07/2024  3:15 PM [facility] PROVIDER 1 [facility]  08/07/2024  3:30 PM [facility] US3 [facility]  08/14/2024  9:00 AM [facility] LAB [facility]  08/14/2024  9:55 AM Eveline Lynwood MATSU, MD [facility]    Lynwood Eveline, MD

## 2024-07-20 ENCOUNTER — Telehealth: Payer: Self-pay | Admitting: Family Medicine

## 2024-07-20 ENCOUNTER — Encounter: Payer: Self-pay | Admitting: *Deleted

## 2024-07-20 ENCOUNTER — Other Ambulatory Visit: Payer: Self-pay | Admitting: *Deleted

## 2024-07-20 NOTE — Telephone Encounter (Signed)
 Patient was calling because she was seen last we and spoke with the provider about refilling a prescription for her muscle relaxer (FLEXERIL ) and nothing has been sent to her pharmacy. She says she really needs this medicine.

## 2024-07-21 ENCOUNTER — Encounter: Payer: Self-pay | Admitting: Advanced Practice Midwife

## 2024-07-21 ENCOUNTER — Other Ambulatory Visit: Payer: Self-pay | Admitting: Advanced Practice Midwife

## 2024-07-21 MED ORDER — CYCLOBENZAPRINE HCL 5 MG PO TABS: 5.0000 mg | ORAL_TABLET | Freq: Three times a day (TID) | ORAL | 2 refills | Status: DC | PRN

## 2024-07-21 NOTE — Progress Notes (Signed)
 [id number]  at [redacted]w[redacted]d  called to request refill of Flexeril .  Rx for 5-10 mg TID PRN sent to pt preferred pharmacy.

## 2024-07-24 ENCOUNTER — Ambulatory Visit

## 2024-07-24 ENCOUNTER — Other Ambulatory Visit

## 2024-07-24 DIAGNOSIS — O0990 Supervision of high risk pregnancy, unspecified, unspecified trimester: Secondary | ICD-10-CM

## 2024-07-24 DIAGNOSIS — O30042 Twin pregnancy, dichorionic/diamniotic, second trimester: Secondary | ICD-10-CM

## 2024-08-07 ENCOUNTER — Other Ambulatory Visit

## 2024-08-07 ENCOUNTER — Ambulatory Visit

## 2024-08-14 ENCOUNTER — Other Ambulatory Visit

## 2024-08-14 ENCOUNTER — Other Ambulatory Visit: Payer: Self-pay

## 2024-08-14 ENCOUNTER — Ambulatory Visit: Admitting: Obstetrics & Gynecology

## 2024-08-14 VITALS — BP 127/84 | HR 74 | Wt 223.0 lb

## 2024-08-14 DIAGNOSIS — O30043 Twin pregnancy, dichorionic/diamniotic, third trimester: Secondary | ICD-10-CM

## 2024-08-14 DIAGNOSIS — O09893 Supervision of other high risk pregnancies, third trimester: Secondary | ICD-10-CM | POA: Diagnosis not present

## 2024-08-14 DIAGNOSIS — O10913 Unspecified pre-existing hypertension complicating pregnancy, third trimester: Secondary | ICD-10-CM | POA: Diagnosis not present

## 2024-08-14 DIAGNOSIS — O24113 Pre-existing diabetes mellitus, type 2, in pregnancy, third trimester: Secondary | ICD-10-CM

## 2024-08-14 DIAGNOSIS — O09899 Supervision of other high risk pregnancies, unspecified trimester: Secondary | ICD-10-CM

## 2024-08-14 DIAGNOSIS — O0990 Supervision of high risk pregnancy, unspecified, unspecified trimester: Secondary | ICD-10-CM

## 2024-08-14 DIAGNOSIS — O0993 Supervision of high risk pregnancy, unspecified, third trimester: Secondary | ICD-10-CM | POA: Diagnosis not present

## 2024-08-14 DIAGNOSIS — O24112 Pre-existing diabetes mellitus, type 2, in pregnancy, second trimester: Secondary | ICD-10-CM

## 2024-08-14 DIAGNOSIS — O10919 Unspecified pre-existing hypertension complicating pregnancy, unspecified trimester: Secondary | ICD-10-CM

## 2024-08-14 DIAGNOSIS — Z3A28 28 weeks gestation of pregnancy: Secondary | ICD-10-CM | POA: Diagnosis not present

## 2024-08-14 MED ORDER — BASAGLAR KWIKPEN 100 UNIT/ML ~~LOC~~ SOPN: PEN_INJECTOR | SUBCUTANEOUS | 3 refills | Status: DC

## 2024-08-14 NOTE — Progress Notes (Signed)
 PRENATAL VISIT NOTE  Subjective:  Misty [name] is a 33 y.o. [phone] at [redacted]w[redacted]d being seen today for ongoing prenatal care.  She is currently monitored for the following issues for this high-risk pregnancy and has Chronic hypertension affecting pregnancy; Marijuana use; History of severe pre-eclampsia; Type 2 diabetes mellitus affecting pregnancy in second trimester, antepartum; History of preterm delivery; History of cesarean section; History of maternal syphilis, currently pregnant; Supervision of high risk pregnancy, antepartum; Twin pregnancy, twins dichorionic and diamniotic; Short interval between pregnancies affecting pregnancy, antepartum; Rubella non-immune status, antepartum; Abnormal MSAFP (maternal serum alpha-fetoprotein), elevated; Obesity affecting pregnancy, antepartum; and Fetal growth restriction antepartum on their problem list.  Patient reports occasional contractions.  Contractions: Regular (daily contractions). Vag. Bleeding: None.  Movement: Present. Denies leaking of fluid.   The following portions of the patient's history were reviewed and updated as appropriate: allergies, current medications, past family history, past medical history, past social history, past surgical history and problem list.   Objective:   Vitals:   08/14/24 0944  BP: 127/84  Pulse: 74  Weight: 223 lb (101.2 kg)    Fetal Status:  Fetal Heart Rate (bpm): 133   Movement: Present    General: Alert, oriented and cooperative. Patient is in no acute distress.  Skin: Skin is warm and dry. No rash noted.   Cardiovascular: Normal heart rate noted  Respiratory: Normal respiratory effort, no problems with respiration noted  Abdomen: Soft, gravid, appropriate for gestational age.  Pain/Pressure: Present (lower abdomen)     Pelvic: Cervical exam deferred        Extremities: Normal range of motion.  Edema: Mild pitting, slight indentation  Mental Status: Normal mood and affect. Normal behavior. Normal  judgment and thought content.      05/05/2024    5:29 PM 05/21/2023    2:23 PM 02/08/2023   11:19 AM  Depression screen PHQ 2/9  Decreased Interest 0 0 0  Down, Depressed, Hopeless 0 0 0  PHQ - 2 Score 0 0 0  Altered sleeping 0 0 0  Tired, decreased energy 0 0 0  Change in appetite 0 0 0  Feeling bad or failure about yourself  0 0 0  Trouble concentrating 0 0 0  Moving slowly or fidgety/restless 0 0 0  Suicidal thoughts 0 0 0  PHQ-9 Score 0  0  0      Data saved with a previous flowsheet row definition        05/05/2024    5:29 PM 05/21/2023    2:23 PM 02/08/2023   11:19 AM 01/08/2023    8:56 AM  GAD 7 : Generalized Anxiety Score  Nervous, Anxious, on Edge 0 0 0 0  Control/stop worrying 0 0 0 0  Worry too much - different things 0 0 0 0  Trouble relaxing 0 0 0 0  Restless 0 0 0 0  Easily annoyed or irritable 0 0 0 0  Afraid - awful might happen 0 0 0 0  Total GAD 7 Score 0 0 0 0    Assessment and Plan:  Pregnancy: [id number] at [redacted]w[redacted]d 1. Chronic hypertension affecting pregnancy (Primary) Well controlled  2. Pre-existing type 2 diabetes mellitus, in pregnancy, second trimester FBS low 100, average 134, adjust insulin  dose - Insulin  Glargine (BASAGLAR  KWIKPEN) 100 UNIT/ML; 30 units [place] in the morning and 22 units at night  Dispense: 15 mL; Refill: 3  3. Dichorionic diamniotic twin pregnancy in third trimester F/u US  4 days,  follow IUGR twin B  4. Supervision of high risk pregnancy, antepartum    5. Short interval between pregnancies affecting pregnancy, antepartum    Preterm labor symptoms and general obstetric precautions including but not limited to vaginal bleeding, contractions, leaking of fluid and fetal movement were reviewed in detail with the patient. Please refer to After Visit Summary for other counseling recommendations.   Return in about 2 weeks (around 08/28/2024).  Future Appointments  Date Time Provider Department Center  08/18/2024 11:15 AM [facility]  PROVIDER 1 [facility]  08/18/2024 11:30 AM [facility] US4 [facility]  09/16/2024 10:15 AM [facility] PROVIDER 1 [facility]  09/16/2024 10:30 AM [facility] US2 [facility]    Lynwood Solomons, MD

## 2024-08-17 LAB — HIV ANTIBODY (ROUTINE TESTING W REFLEX): HIV Screen 4th Generation wRfx: NONREACTIVE

## 2024-08-17 LAB — CBC
Hematocrit: 34.5 % (ref 34.0–46.6)
Hemoglobin: 11.1 g/dL (ref 11.1–15.9)
MCH: 33.6 pg — ABNORMAL HIGH (ref 26.6–33.0)
MCHC: 32.2 g/dL (ref 31.5–35.7)
MCV: 105 fL — ABNORMAL HIGH (ref 79–97)
Platelets: 272 x10E3/uL (ref 150–450)
RBC: 3.3 x10E6/uL — ABNORMAL LOW (ref 3.77–5.28)
RDW: 14 % (ref 11.7–15.4)
WBC: 5.7 x10E3/uL (ref 3.4–10.8)

## 2024-08-17 LAB — RPR, QUANT+TP ABS (REFLEX)
Rapid Plasma Reagin, Quant: 1:1 {titer} — ABNORMAL HIGH
T Pallidum Abs: REACTIVE — AB

## 2024-08-17 LAB — SYPHILIS: RPR W/REFLEX TO RPR TITER AND TREPONEMAL ANTIBODIES, TRADITIONAL SCREENING AND DIAGNOSIS ALGORITHM: RPR Ser Ql: REACTIVE — AB

## 2024-08-18 ENCOUNTER — Other Ambulatory Visit

## 2024-08-18 ENCOUNTER — Ambulatory Visit: Payer: Self-pay | Admitting: Obstetrics and Gynecology

## 2024-08-25 ENCOUNTER — Ambulatory Visit

## 2024-08-25 DIAGNOSIS — O30042 Twin pregnancy, dichorionic/diamniotic, second trimester: Secondary | ICD-10-CM

## 2024-08-25 DIAGNOSIS — O365990 Maternal care for other known or suspected poor fetal growth, unspecified trimester, not applicable or unspecified: Secondary | ICD-10-CM

## 2024-08-25 DIAGNOSIS — O24112 Pre-existing diabetes mellitus, type 2, in pregnancy, second trimester: Secondary | ICD-10-CM

## 2024-09-01 ENCOUNTER — Ambulatory Visit (INDEPENDENT_AMBULATORY_CARE_PROVIDER_SITE_OTHER): Payer: Self-pay | Admitting: Obstetrics and Gynecology

## 2024-09-01 VITALS — BP 151/92 | HR 94 | Wt 228.6 lb

## 2024-09-01 DIAGNOSIS — Z8751 Personal history of pre-term labor: Secondary | ICD-10-CM

## 2024-09-01 DIAGNOSIS — Z3490 Encounter for supervision of normal pregnancy, unspecified, unspecified trimester: Secondary | ICD-10-CM

## 2024-09-01 DIAGNOSIS — O09893 Supervision of other high risk pregnancies, third trimester: Secondary | ICD-10-CM | POA: Diagnosis not present

## 2024-09-01 DIAGNOSIS — O0990 Supervision of high risk pregnancy, unspecified, unspecified trimester: Secondary | ICD-10-CM | POA: Diagnosis not present

## 2024-09-01 DIAGNOSIS — O0993 Supervision of high risk pregnancy, unspecified, third trimester: Secondary | ICD-10-CM | POA: Diagnosis not present

## 2024-09-01 DIAGNOSIS — O24113 Pre-existing diabetes mellitus, type 2, in pregnancy, third trimester: Secondary | ICD-10-CM | POA: Diagnosis not present

## 2024-09-01 DIAGNOSIS — O30043 Twin pregnancy, dichorionic/diamniotic, third trimester: Secondary | ICD-10-CM | POA: Diagnosis not present

## 2024-09-01 DIAGNOSIS — Z2839 Other underimmunization status: Secondary | ICD-10-CM | POA: Diagnosis not present

## 2024-09-01 DIAGNOSIS — O10913 Unspecified pre-existing hypertension complicating pregnancy, third trimester: Secondary | ICD-10-CM | POA: Diagnosis not present

## 2024-09-01 DIAGNOSIS — Z3A30 30 weeks gestation of pregnancy: Secondary | ICD-10-CM | POA: Diagnosis not present

## 2024-09-01 DIAGNOSIS — Z8759 Personal history of other complications of pregnancy, childbirth and the puerperium: Secondary | ICD-10-CM

## 2024-09-01 DIAGNOSIS — Z98891 History of uterine scar from previous surgery: Secondary | ICD-10-CM | POA: Diagnosis not present

## 2024-09-01 DIAGNOSIS — O365930 Maternal care for other known or suspected poor fetal growth, third trimester, not applicable or unspecified: Secondary | ICD-10-CM

## 2024-09-01 DIAGNOSIS — O09899 Supervision of other high risk pregnancies, unspecified trimester: Secondary | ICD-10-CM

## 2024-09-01 DIAGNOSIS — O10919 Unspecified pre-existing hypertension complicating pregnancy, unspecified trimester: Secondary | ICD-10-CM

## 2024-09-01 DIAGNOSIS — O365990 Maternal care for other known or suspected poor fetal growth, unspecified trimester, not applicable or unspecified: Secondary | ICD-10-CM

## 2024-09-01 NOTE — Progress Notes (Signed)
 "  PRENATAL VISIT NOTE  Subjective:  Misty [name] is a 33 y.o. [phone] at [redacted]w[redacted]d being seen today for ongoing prenatal care.  She is currently monitored for the following issues for this high-risk pregnancy and has Chronic hypertension affecting pregnancy; Marijuana use; History of severe pre-eclampsia; Type 2 diabetes mellitus affecting pregnancy in third trimester, antepartum; History of preterm delivery; History of cesarean section; History of maternal syphilis, currently pregnant; Supervision of high risk pregnancy, antepartum; Twin pregnancy, twins dichorionic and diamniotic; Short interval between pregnancies affecting pregnancy, antepartum; Rubella non-immune status, antepartum; Abnormal MSAFP (maternal serum alpha-fetoprotein), elevated; Obesity affecting pregnancy, antepartum; and Fetal growth restriction antepartum on their problem list.  Patient doing well with no acute concerns today. She reports intermittent contractions.  Contractions: Irritability. Vag. Bleeding: None.  Movement: Present. Denies leaking of fluid.   The following portions of the patient's history were reviewed and updated as appropriate: allergies, current medications, past family history, past medical history, past social history, past surgical history and problem list. Problem list updated.  Objective:   Vitals:   09/01/24 1627  BP: (!) 151/92  Pulse: 94  Weight: 228 lb 9.6 oz (103.7 kg)    Fetal Status: Fetal Heart Rate (bpm): 151/154   Movement: Present     General:  Alert, oriented and cooperative. Patient is in no acute distress.  Skin: Skin is warm and dry. No rash noted.   Cardiovascular: Normal heart rate noted  Respiratory: Normal respiratory effort, no problems with respiration noted  Abdomen: Soft, gravid, appropriate for gestational age.  Pain/Pressure: Present     Pelvic: Cervical exam deferred        Extremities: Normal range of motion.     Mental Status:  Normal mood and affect. Normal  behavior. Normal judgment and thought content.   Assessment and Plan:  Pregnancy: [id number] at [redacted]w[redacted]d  1. [redacted] weeks gestation of pregnancy (Primary)   2. Chronic hypertension affecting pregnancy BP elevated more than baseline, preeclampsia labs ordered Preeclampsia precautions given Pt notes intermittent headaches  - CBC - Comprehensive metabolic panel with GFR - Protein / creatinine ratio, urine  3. Type 2 diabetes mellitus affecting pregnancy in third trimester, antepartum 24 units daytime=>22 20 units at night=> 18  Pt is having issues keeping her dexcom on and is not tightly monitoring her blood sugars. She notes hypoglycemia occasionally throughout the day. Pt advised to decrease her insulin  by 2 units both day and night. Message sent to Jen Rasche to optimize the patient's diabetic care.  4. Dichorionic diamniotic twin pregnancy in third trimester Continued monitoring per MFM  5. Supervision of high risk pregnancy, antepartum Continue routine prenatal care  - CBC - Comprehensive metabolic panel with GFR - Protein / creatinine ratio, urine  6. Short interval between pregnancies affecting pregnancy, antepartum   7. Rubella non-immune status, antepartum Treat after delivery  8. History of severe pre-eclampsia Monitor closely for preeclampsia due previous history  9. History of preterm delivery Pt has intermittent contractions. Due to multiple pregnancy she is more likely to have preterm contractions. Pt advised to go to MAU for evaluation if contractions become more organized  10. History of maternal syphilis, currently pregnant Last checked 08/14/24 and tiers were 1:1  11. History of cesarean section Pt desires repeat c section for delivery.  She does not desire tubal ligation  12. Fetal growth restriction antepartum Fetal dopplers per MFM Weekly fetal testing likely starting at 32 weeks  Preterm labor symptoms and general obstetric precautions  including  but not limited to vaginal bleeding, contractions, leaking of fluid and fetal movement were reviewed in detail with the patient.  Please refer to After Visit Summary for other counseling recommendations.   Return in about 2 weeks (around 09/15/2024) for [facility], in person.   Jerilynn Buddle, MD Faculty Attending Center for [facility]   "

## 2024-09-02 LAB — CBC
Hematocrit: 33.7 % — ABNORMAL LOW (ref 34.0–46.6)
Hemoglobin: 11.3 g/dL (ref 11.1–15.9)
MCH: 34.3 pg — ABNORMAL HIGH (ref 26.6–33.0)
MCHC: 33.5 g/dL (ref 31.5–35.7)
MCV: 102 fL — ABNORMAL HIGH (ref 79–97)
Platelets: 250 x10E3/uL (ref 150–450)
RBC: 3.29 x10E6/uL — ABNORMAL LOW (ref 3.77–5.28)
RDW: 13.1 % (ref 11.7–15.4)
WBC: 5.7 x10E3/uL (ref 3.4–10.8)

## 2024-09-02 LAB — COMPREHENSIVE METABOLIC PANEL WITH GFR
ALT: 16 IU/L (ref 0–32)
AST: 22 IU/L (ref 0–40)
Albumin: 3.7 g/dL — ABNORMAL LOW (ref 3.9–4.9)
Alkaline Phosphatase: 117 IU/L — ABNORMAL HIGH (ref 41–116)
BUN/Creatinine Ratio: 6 — ABNORMAL LOW (ref 9–23)
BUN: 4 mg/dL — ABNORMAL LOW (ref 6–20)
Bilirubin Total: 0.3 mg/dL (ref 0.0–1.2)
CO2: 18 mmol/L — ABNORMAL LOW (ref 20–29)
Calcium: 9.3 mg/dL (ref 8.7–10.2)
Chloride: 100 mmol/L (ref 96–106)
Creatinine, Ser: 0.68 mg/dL (ref 0.57–1.00)
Globulin, Total: 2.4 g/dL (ref 1.5–4.5)
Glucose: 154 mg/dL — ABNORMAL HIGH (ref 70–99)
Potassium: 3.9 mmol/L (ref 3.5–5.2)
Sodium: 135 mmol/L (ref 134–144)
Total Protein: 6.1 g/dL (ref 6.0–8.5)
eGFR: 118 mL/min/1.73

## 2024-09-03 NOTE — L&D Delivery Note (Signed)
 Faculty Note  Patient taken directly to room from MAU for 32 weeks di/di twins 10/100/+1. Patient very uncomfortable as she had no pain control on board. IV placed, labs drawn. Difficult to find HR for both twins and bedside US  used to confirm fetal heart rates and to place monitors. Both FHR within normal range. Patient counseled safest thing would be to proceed with vaginal delivery and she was agreeable. She pushed well and delivered Twin A from cephalic position. Cord clamped and cut and infant handed to waiting NICU staff. US  confirmed complete breech for second Twin B. Dr. Lane in room. Pt counseled regarding vaginal breech extraction and pt agreeable. Amniotic sac broken by Dr. Aurelia who was able to deliver both feet, subsequently left and right arms and then fetal head atraumatically. Cord clamped and cut and infant handed to waiting pedi staff. Cord blood obtained for both twins. Pitocin  and TXA given, placentas delivered spontaneously intact. Pt fundus firm with minimal palpation, small laceration at periurethra was bleeding but slowed to a trickle with pressure. Not repaired.    EBL 429 mL. Twin A: apgars 6/9. Weight 1700 gms. Twin B: apgars 4/7. 1790 gms.    LOIS Yolanda Moats, MD, [facility] Attending Center for [employer] [facility])

## 2024-09-04 ENCOUNTER — Ambulatory Visit: Payer: Self-pay | Admitting: Obstetrics and Gynecology

## 2024-09-11 ENCOUNTER — Telehealth: Admitting: Obstetrics and Gynecology

## 2024-09-11 DIAGNOSIS — O24113 Pre-existing diabetes mellitus, type 2, in pregnancy, third trimester: Secondary | ICD-10-CM

## 2024-09-11 MED ORDER — METOCLOPRAMIDE HCL 10 MG PO TABS: 10.0000 mg | ORAL_TABLET | Freq: Three times a day (TID) | ORAL | 1 refills | Status: DC

## 2024-09-11 NOTE — Progress Notes (Unsigned)
 "   TELEHEALTH OBSTETRICS VISIT ENCOUNTER NOTE  FOR DIABETES MANAGEMENT DURING PREGNANCY    Provider location: Home   Patient location: Home  I connected with Misty [name] on 09/11/2024 at 11:30 AM EST by telephone at home and verified that I am speaking with the correct person using two identifiers. Of note, unable to do video encounter due to technical difficulties.    I discussed the limitations, risks, security and privacy concerns of performing an evaluation and management service by telephone and the availability of in person appointments. I also discussed with the patient that there may be a patient responsible charge related to this service. The patient expressed understanding and agreed to proceed.   History:   Misty [name] is a 34 y.o. [id number] at [redacted]w[redacted]d by LMP being seen today for diabetes management during pregnancy.   Patient reports no complaints. Reports fetal movement. Denies any contractions, bleeding or leaking of fluid.   The following portions of the patient's history were reviewed and updated as appropriate: allergies, current medications, past family history, past medical history, past social history, past surgical history and problem list.   Past Medical History:  Diagnosis Date   Abscess 09/02/2020   Allergy    Anemia 2015   Asthma    as child   Chronic hypertension with superimposed preeclampsia 05/24/2021   Diabetes mellitus without complication (HCC) 2015   DKA (diabetic ketoacidosis) (HCC) 09/02/2020   Eczema    History of pre-eclampsia    Hypertension 09/2013   Trichomonas infection 04/2023   UTI (urinary tract infection)    Past Surgical History:  Procedure Laterality Date   Abcess on back     CESAREAN SECTION N/A 10/27/2023   Procedure: CESAREAN SECTION;  Surgeon: Zina Jerilynn LABOR, MD;  Location: [facility] LD ORS;  Service: Obstetrics;  Laterality: N/A;   Family History  Problem Relation Age of Onset   Migraines Mother    Hypertension Mother     Heart disease Mother 61       CHF   Diabetes Mother    Healthy Father    Sickle cell anemia Brother    Migraines Maternal Grandmother    Diabetes Maternal Grandmother    Heart disease Maternal Grandmother    Hypertension Maternal Grandfather    Diabetes Maternal Grandfather    Heart disease Maternal Grandfather    Asthma Neg Hx    Cancer Neg Hx    Social History[1] Allergies[2] Medications Ordered Prior to Encounter[3]    Objective:   General:  Alert, oriented and cooperative.   Mental Status: Normal mood and affect perceived. Normal judgment and thought content.   The Rest of physical exam deferred due to type of encounter  Hospital admissions related to diabetes? None recently    Type 2 DM/ Diagnosed    Anatomy scan Di/Di twins  Growth US  EFW 17% tile, AC 43% (twin A) EFW 7%tile, AC 3.8%    Most recent HgA1c: 8.8%   Fetal Echo: A follow-up fetal echocardiogram is not indicated. A postnatal transthoracic echocardiogram is recommended in the newborn nursery.    Blood sugar monitoring: has Dexcom CGM in place. With Data sharing now set up.   With CGM, target Blood sugar set at 65-140.   Currently glucose in target range is 65%  in target range    Has back up glucose monitor     Fasting BS: 110-120   2 hour PP around 125  No Low BS readings noted at all.  Current Diabetes medications:   Basaglar  22 units qAM> will increase to 24 units  Basaglr 18 units qPM> will increase to 22 units   Plan:   -Continue Above  -Discussed the importance of taking ASA and procardia . She stopped these due to nausea.   - Rx for Reglan . She is also taking Zofran .    -Discussed only 30-40 grams of carbohydrates with each meal, with majority of your meals being protein and high fiber vegetables.    -Start a fiber supplement everyday.    -Protein snack before bed. Recommend 10-20 grams of protein before bed.    -Anticipate weekly diabetes visits until BS's are  better controlled -Anticipate frequent in person OB visits along with MFM care.  -q4 week growth US  starting at 20w with antenatal weekly testing starting at 32 weeks or sooner if needed.    -Continue BASA 81 mg      This visit is for the purposes of diabetes management only. Please keep scheduled OB visits with OB team for prenatal care.    Preterm labor symptoms and general obstetric precautions including but not limited to vaginal bleeding, contractions, leaking of fluid and fetal movement were reviewed in detail with the patient.  I discussed the assessment and treatment plan with the patient. The patient was provided an opportunity to ask questions and all were answered. The patient agreed with the plan and demonstrated an understanding of the instructions. The patient was advised to call back or seek an in-person office evaluation/go to MAU at [facility] for any urgent or concerning symptoms. Please refer to After Visit Summary for other counseling recommendations.    I provided 15 minutes of non-face-to-face time during this encounter.   Risa Auman, Delon FERNS, NP Faculty Practice Center for [employer], [facility]         [1]  Social History Tobacco Use   Smoking status: Former    Current packs/day: 0.00    Average packs/day: 0.5 packs/day for 2.0 years (1.0 ttl pk-yrs)    Types: Cigarettes    Start date: 09/09/2011    Quit date: 09/08/2013    Years since quitting: 11.0   Smokeless tobacco: Never  Vaping Use   Vaping status: Never Used  Substance Use Topics   Alcohol use: No    Alcohol/week: 0.0 standard drinks of alcohol   Drug use: No  [2]  Allergies Allergen Reactions   Iodides Anaphylaxis and Itching   Morphine  And Codeine Anaphylaxis and Itching   Shellfish Allergy Anaphylaxis and Itching    Pt reports watery eyes   Ultram  [Tramadol  Hcl] Hives    Hives and swollen lips   [3]  Current Outpatient Medications on File Prior  to Visit  Medication Sig Dispense Refill   acetaminophen  (TYLENOL ) 500 MG tablet Take 2 tablets (1,000 mg total) by mouth every 6 (six) hours as needed for mild pain (pain score 1-3). 100 tablet 2   aspirin  EC 81 MG tablet Take 2 tablets (162 mg total) by mouth daily. Take after 12 weeks for prevention of preeclampsia later in pregnancy 300 tablet 2   Blood Glucose Monitoring Suppl DEVI 1 each by Does not apply route in the morning, at noon, and at bedtime. May substitute to any manufacturer covered by patient's insurance. 1 each 0   Blood Pressure Monitoring (BLOOD PRESSURE MONITOR AUTOMAT) DEVI 1 each by Does not apply route daily. 1 each 0   Continuous Glucose Sensor (DEXCOM G7 SENSOR) MISC Place  one monitor onto the skin. Change every ten days 3 each 7   cyclobenzaprine  (FLEXERIL ) 5 MG tablet Take 1-2 tablets (5-10 mg total) by mouth 3 (three) times daily as needed for muscle spasms. 30 tablet 2   Insulin  Glargine (BASAGLAR  KWIKPEN) 100 UNIT/ML 30 units [place] in the morning and 22 units at night 15 mL 3   NIFEdipine  (ADALAT  CC) 60 MG 24 hr tablet Take 1 tablet (60 mg total) by mouth daily. 120 tablet 2   ondansetron  (ZOFRAN -ODT) 4 MG disintegrating tablet Take 1 tablet (4 mg total) by mouth every 8 (eight) hours as needed for nausea or vomiting. 30 tablet 2   Prenatal Vit-Fe Fumarate-FA (WESTAB PLUS) 27-1 MG TABS TAKE 1 TABLET BY MOUTH DAILY 30 tablet 11   scopolamine  (TRANSDERM-SCOP) 1 MG/3DAYS Place 1 patch (1 mg total) onto the skin every 3 (three) days. 10 patch 1   No current facility-administered medications on file prior to visit.   "

## 2024-09-12 ENCOUNTER — Encounter (HOSPITAL_COMMUNITY): Payer: Self-pay

## 2024-09-12 ENCOUNTER — Encounter (HOSPITAL_COMMUNITY): Payer: Self-pay | Admitting: Obstetrics and Gynecology

## 2024-09-12 ENCOUNTER — Inpatient Hospital Stay (HOSPITAL_COMMUNITY)
Admission: AD | Admit: 2024-09-12 | Discharge: 2024-09-14 | DRG: 805 | Disposition: A | Attending: Obstetrics and Gynecology | Admitting: Obstetrics and Gynecology

## 2024-09-12 DIAGNOSIS — Z3A32 32 weeks gestation of pregnancy: Secondary | ICD-10-CM

## 2024-09-12 DIAGNOSIS — O24113 Pre-existing diabetes mellitus, type 2, in pregnancy, third trimester: Secondary | ICD-10-CM | POA: Diagnosis present

## 2024-09-12 DIAGNOSIS — O2412 Pre-existing diabetes mellitus, type 2, in childbirth: Secondary | ICD-10-CM | POA: Diagnosis present

## 2024-09-12 DIAGNOSIS — O365932 Maternal care for other known or suspected poor fetal growth, third trimester, fetus 2: Secondary | ICD-10-CM | POA: Diagnosis present

## 2024-09-12 DIAGNOSIS — Z833 Family history of diabetes mellitus: Secondary | ICD-10-CM | POA: Diagnosis not present

## 2024-09-12 DIAGNOSIS — Z87891 Personal history of nicotine dependence: Secondary | ICD-10-CM

## 2024-09-12 DIAGNOSIS — E119 Type 2 diabetes mellitus without complications: Secondary | ICD-10-CM | POA: Diagnosis present

## 2024-09-12 DIAGNOSIS — O99214 Obesity complicating childbirth: Secondary | ICD-10-CM

## 2024-09-12 DIAGNOSIS — O321XX2 Maternal care for breech presentation, fetus 2: Secondary | ICD-10-CM | POA: Diagnosis present

## 2024-09-12 DIAGNOSIS — O114 Pre-existing hypertension with pre-eclampsia, complicating childbirth: Secondary | ICD-10-CM | POA: Diagnosis present

## 2024-09-12 DIAGNOSIS — Z794 Long term (current) use of insulin: Secondary | ICD-10-CM | POA: Diagnosis not present

## 2024-09-12 DIAGNOSIS — O365931 Maternal care for other known or suspected poor fetal growth, third trimester, fetus 1: Secondary | ICD-10-CM | POA: Diagnosis present

## 2024-09-12 DIAGNOSIS — O1092 Unspecified pre-existing hypertension complicating childbirth: Secondary | ICD-10-CM | POA: Diagnosis present

## 2024-09-12 DIAGNOSIS — O34211 Maternal care for low transverse scar from previous cesarean delivery: Secondary | ICD-10-CM

## 2024-09-12 DIAGNOSIS — O34219 Maternal care for unspecified type scar from previous cesarean delivery: Secondary | ICD-10-CM | POA: Diagnosis present

## 2024-09-12 DIAGNOSIS — O365930 Maternal care for other known or suspected poor fetal growth, third trimester, not applicable or unspecified: Secondary | ICD-10-CM

## 2024-09-12 DIAGNOSIS — O321XX0 Maternal care for breech presentation, not applicable or unspecified: Principal | ICD-10-CM | POA: Diagnosis present

## 2024-09-12 DIAGNOSIS — O119 Pre-existing hypertension with pre-eclampsia, unspecified trimester: Secondary | ICD-10-CM | POA: Diagnosis present

## 2024-09-12 DIAGNOSIS — O1414 Severe pre-eclampsia complicating childbirth: Secondary | ICD-10-CM

## 2024-09-12 DIAGNOSIS — O30043 Twin pregnancy, dichorionic/diamniotic, third trimester: Secondary | ICD-10-CM

## 2024-09-12 DIAGNOSIS — Z8249 Family history of ischemic heart disease and other diseases of the circulatory system: Secondary | ICD-10-CM | POA: Diagnosis not present

## 2024-09-12 DIAGNOSIS — O24420 Gestational diabetes mellitus in childbirth, diet controlled: Secondary | ICD-10-CM

## 2024-09-12 DIAGNOSIS — O09899 Supervision of other high risk pregnancies, unspecified trimester: Secondary | ICD-10-CM

## 2024-09-12 LAB — COMPREHENSIVE METABOLIC PANEL WITH GFR
ALT: 21 U/L (ref 0–44)
AST: 33 U/L (ref 15–41)
Albumin: 3.9 g/dL (ref 3.5–5.0)
Alkaline Phosphatase: 159 U/L — ABNORMAL HIGH (ref 38–126)
Anion gap: 16 — ABNORMAL HIGH (ref 5–15)
BUN: <5 mg/dL — ABNORMAL LOW (ref 6–20)
CO2: 19 mmol/L — ABNORMAL LOW (ref 22–32)
Calcium: 9.6 mg/dL (ref 8.9–10.3)
Chloride: 99 mmol/L (ref 98–111)
Creatinine, Ser: 0.74 mg/dL (ref 0.44–1.00)
GFR, Estimated: >60 mL/min
Glucose, Bld: 180 mg/dL — ABNORMAL HIGH (ref 70–99)
Potassium: 4.1 mmol/L (ref 3.5–5.1)
Sodium: 135 mmol/L (ref 135–145)
Total Bilirubin: 0.5 mg/dL (ref 0.0–1.2)
Total Protein: 7.5 g/dL (ref 6.5–8.1)

## 2024-09-12 LAB — CBC
HCT: 37.5 % (ref 36.0–46.0)
Hemoglobin: 13.2 g/dL (ref 12.0–15.0)
MCH: 34.8 pg — ABNORMAL HIGH (ref 26.0–34.0)
MCHC: 35.2 g/dL (ref 30.0–36.0)
MCV: 98.9 fL (ref 80.0–100.0)
Platelets: 250 K/uL (ref 150–400)
RBC: 3.79 MIL/uL — ABNORMAL LOW (ref 3.87–5.11)
RDW: 13.2 % (ref 11.5–15.5)
WBC: 12.1 K/uL — ABNORMAL HIGH (ref 4.0–10.5)
nRBC: 0 % (ref 0.0–0.2)

## 2024-09-12 LAB — TYPE AND SCREEN
ABO/RH(D): O POS
Antibody Screen: NEGATIVE

## 2024-09-12 MED ORDER — OXYTOCIN BOLUS FROM INFUSION
333.0000 mL | Freq: Once | INTRAVENOUS | Status: AC
  Administered 2024-09-12: 333 mL via INTRAVENOUS

## 2024-09-12 MED ORDER — MAGNESIUM SULFATE BOLUS VIA INFUSION
4.0000 g | Freq: Once | INTRAVENOUS | Status: AC
  Administered 2024-09-12: 4 g via INTRAVENOUS
  Filled 2024-09-12: qty 1000

## 2024-09-12 MED ORDER — OXYCODONE HCL 5 MG PO TABS: 5.0000 mg | ORAL_TABLET | ORAL | Status: DC | PRN

## 2024-09-12 MED ORDER — TRANEXAMIC ACID-NACL 1000-0.7 MG/100ML-% IV SOLN
INTRAVENOUS | Status: AC
  Filled 2024-09-12: qty 100

## 2024-09-12 MED ORDER — SENNOSIDES-DOCUSATE SODIUM 8.6-50 MG PO TABS
2.0000 | ORAL_TABLET | Freq: Every day | ORAL | Status: DC
  Administered 2024-09-14: 2 via ORAL
  Filled 2024-09-12 (×2): qty 2

## 2024-09-12 MED ORDER — LIDOCAINE HCL (PF) 1 % IJ SOLN: 30.0000 mL | INTRAMUSCULAR | Status: DC | PRN

## 2024-09-12 MED ORDER — WITCH HAZEL-GLYCERIN EX PADS: 1.0000 | MEDICATED_PAD | CUTANEOUS | Status: DC | PRN

## 2024-09-12 MED ORDER — ONDANSETRON HCL 4 MG/2ML IJ SOLN: 4.0000 mg | INTRAMUSCULAR | Status: DC | PRN

## 2024-09-12 MED ORDER — OXYCODONE-ACETAMINOPHEN 5-325 MG PO TABS: 2.0000 | ORAL_TABLET | ORAL | Status: DC | PRN

## 2024-09-12 MED ORDER — OXYTOCIN-SODIUM CHLORIDE 30-0.9 UT/500ML-% IV SOLN: 2.5000 [IU]/h | INTRAVENOUS | Status: DC

## 2024-09-12 MED ORDER — SOD CITRATE-CITRIC ACID 500-334 MG/5ML PO SOLN: 30.0000 mL | ORAL | Status: DC | PRN

## 2024-09-12 MED ORDER — ACETAMINOPHEN 325 MG PO TABS
650.0000 mg | ORAL_TABLET | ORAL | Status: DC | PRN
  Filled 2024-09-12: qty 2

## 2024-09-12 MED ORDER — OXYTOCIN 10 UNIT/ML IJ SOLN
INTRAMUSCULAR | Status: AC
  Administered 2024-09-12: 10 [IU]
  Filled 2024-09-12: qty 1

## 2024-09-12 MED ORDER — HYDRALAZINE HCL 20 MG/ML IJ SOLN
10.0000 mg | INTRAMUSCULAR | Status: DC | PRN
  Administered 2024-09-12 – 2024-09-13 (×3): 10 mg via INTRAVENOUS

## 2024-09-12 MED ORDER — ONDANSETRON HCL 4 MG PO TABS: 4.0000 mg | ORAL_TABLET | ORAL | Status: DC | PRN

## 2024-09-12 MED ORDER — LIDOCAINE HCL (PF) 1 % IJ SOLN
INTRAMUSCULAR | Status: AC
  Filled 2024-09-12: qty 30

## 2024-09-12 MED ORDER — OXYTOCIN-SODIUM CHLORIDE 30-0.9 UT/500ML-% IV SOLN
INTRAVENOUS | Status: AC
  Filled 2024-09-12: qty 500

## 2024-09-12 MED ORDER — DIBUCAINE (PERIANAL) 1 % EX OINT: 1.0000 | TOPICAL_OINTMENT | CUTANEOUS | Status: DC | PRN

## 2024-09-12 MED ORDER — ONDANSETRON HCL 4 MG/2ML IJ SOLN: 4.0000 mg | Freq: Four times a day (QID) | INTRAMUSCULAR | Status: DC | PRN

## 2024-09-12 MED ORDER — PRENATAL MULTIVITAMIN CH
1.0000 | ORAL_TABLET | Freq: Every day | ORAL | Status: DC
  Administered 2024-09-13 – 2024-09-14 (×2): 1 via ORAL
  Filled 2024-09-12 (×2): qty 1

## 2024-09-12 MED ORDER — LACTATED RINGERS IV SOLN: 500.0000 mL | INTRAVENOUS | Status: DC | PRN

## 2024-09-12 MED ORDER — LACTATED RINGERS IV SOLN: INTRAVENOUS | Status: DC

## 2024-09-12 MED ORDER — DIPHENHYDRAMINE HCL 25 MG PO CAPS: 25.0000 mg | ORAL_CAPSULE | Freq: Four times a day (QID) | ORAL | Status: DC | PRN

## 2024-09-12 MED ORDER — SIMETHICONE 80 MG PO CHEW: 80.0000 mg | CHEWABLE_TABLET | ORAL | Status: DC | PRN

## 2024-09-12 MED ORDER — LABETALOL HCL 5 MG/ML IV SOLN: 40.0000 mg | INTRAVENOUS | Status: DC | PRN

## 2024-09-12 MED ORDER — LABETALOL HCL 5 MG/ML IV SOLN: 20.0000 mg | INTRAVENOUS | Status: DC | PRN

## 2024-09-12 MED ORDER — HYDRALAZINE HCL 20 MG/ML IJ SOLN
5.0000 mg | INTRAMUSCULAR | Status: DC | PRN
  Administered 2024-09-12 – 2024-09-13 (×4): 5 mg via INTRAVENOUS
  Filled 2024-09-12 (×2): qty 1

## 2024-09-12 MED ORDER — IBUPROFEN 600 MG PO TABS
600.0000 mg | ORAL_TABLET | Freq: Four times a day (QID) | ORAL | Status: DC
  Administered 2024-09-12 – 2024-09-14 (×7): 600 mg via ORAL
  Filled 2024-09-12 (×7): qty 1

## 2024-09-12 MED ORDER — OXYCODONE-ACETAMINOPHEN 5-325 MG PO TABS: 1.0000 | ORAL_TABLET | ORAL | Status: DC | PRN

## 2024-09-12 MED ORDER — INSULIN GLARGINE-YFGN 100 UNIT/ML ~~LOC~~ SOLN
18.0000 [IU] | Freq: Two times a day (BID) | SUBCUTANEOUS | Status: DC
  Administered 2024-09-12 – 2024-09-14 (×4): 18 [IU] via SUBCUTANEOUS
  Filled 2024-09-12 (×7): qty 0.18

## 2024-09-12 MED ORDER — ACETAMINOPHEN 325 MG PO TABS
650.0000 mg | ORAL_TABLET | ORAL | Status: DC | PRN
  Administered 2024-09-12: 650 mg via ORAL

## 2024-09-12 MED ORDER — HYDRALAZINE HCL 20 MG/ML IJ SOLN
5.0000 mg | INTRAMUSCULAR | Status: DC | PRN
  Filled 2024-09-12 (×3): qty 1

## 2024-09-12 MED ORDER — INSULIN ASPART 100 UNIT/ML IJ SOLN
0.0000 [IU] | Freq: Three times a day (TID) | INTRAMUSCULAR | Status: DC
  Administered 2024-09-13: 3 [IU] via SUBCUTANEOUS
  Administered 2024-09-13: 7 [IU] via SUBCUTANEOUS

## 2024-09-12 MED ORDER — BENZOCAINE-MENTHOL 20-0.5 % EX AERO
1.0000 | INHALATION_SPRAY | CUTANEOUS | Status: DC | PRN
  Filled 2024-09-12: qty 56

## 2024-09-12 MED ORDER — FENTANYL CITRATE (PF) 100 MCG/2ML IJ SOLN
INTRAMUSCULAR | Status: AC
  Administered 2024-09-12: 100 ug
  Filled 2024-09-12: qty 2

## 2024-09-12 MED ORDER — LACTATED RINGERS IV SOLN: INTRAVENOUS | Status: AC

## 2024-09-12 MED ORDER — MAGNESIUM SULFATE 40 GM/1000ML IV SOLN
2.0000 g/h | INTRAVENOUS | Status: AC
  Administered 2024-09-12 – 2024-09-13 (×2): 2 g/h via INTRAVENOUS
  Filled 2024-09-12 (×2): qty 1000

## 2024-09-12 MED ORDER — TETANUS-DIPHTH-ACELL PERTUSSIS 5-2-15.5 LF-MCG/0.5 IM SUSP: 0.5000 mL | Freq: Once | INTRAMUSCULAR | Status: DC

## 2024-09-12 MED ORDER — COCONUT OIL OIL
1.0000 | TOPICAL_OIL | Status: DC | PRN
  Administered 2024-09-13: 1 via TOPICAL

## 2024-09-12 MED ORDER — HYDRALAZINE HCL 20 MG/ML IJ SOLN: 10.0000 mg | INTRAMUSCULAR | Status: DC | PRN

## 2024-09-12 NOTE — Discharge Summary (Signed)
 "    Postpartum Discharge Summary  Date of Service updated***     Patient Name: Misty [name] DOB: Jul 21, 1991 MRN: [id number]  Date of admission: 09/12/2024 Delivery date:   Misty [name] [[id number]]  09/12/2024    Misty [name] [[id number]]  09/12/2024 Delivering provider:    Vannie Hopkins A [name] [[id number]]  Misty [name] Misty [name] [[id number]]  Misty BURNARD M Date of discharge: 09/12/2024  Admitting diagnosis: Labor and delivery affected by breech presentation [O32.1XX0] Normal labor [O80, Z37.9] Intrauterine pregnancy: [redacted]w[redacted]d     Secondary diagnosis:  Principal Problem:   Labor and delivery affected by breech presentation Active Problems:   Normal labor  Additional problems: ***    Discharge diagnosis: {DX.:23714}                                              Post partum procedures:{Postpartum procedures:23558} Augmentation: N/A Complications: None  Hospital course: Onset of Labor With Vaginal Delivery      34 y.o. yo H5E8785 at [redacted]w[redacted]d was admitted in Active Labor on 09/12/2024. Labor course was complicated by cephalic Twin A, breech Twin B. Patient arrived 10/100/+1 and delivered shortly thereafter with breech extraction twin B.  Membrane Rupture Time/Date:    Misty [name] [[id number]]  7:16 PM    Misty [name] [[id number]]  7:16 PM,   Misty [name] [[id number]]  09/12/2024    Misty [name] [[id number]]  09/12/2024  Delivery Method:   Misty [name] [[id number]]  Vaginal, Spontaneous    Misty [name] [[id number]]  Vaginal, Spontaneous Operative Delivery:N/A Episiotomy:    Misty [name] [[id number]]  None    Misty [name] [[id number]]  None Lacerations:     Misty [name] [[id number]]  Periurethral    Misty [name] [[id number]]  Periurethral Patient had a postpartum course complicated by ***.  She is ambulating, tolerating a regular diet, passing  flatus, and urinating well. Patient is discharged home in stable condition on 09/12/2024.  Newborn Data: Birth date:   Misty [name] [[id number]]  09/12/2024    Misty [name] [[id number]]  09/12/2024 Birth time:   Misty [name] [[id number]]  7:13 PM    Misty [name] [[id number]]  7:18 PM Gender:   Misty [name] [[id number]]  Female    Misty [name] [[id number]]  Female Living status:   Misty [name] [[id number]]  Living    Misty [name] [[id number]]  Living Apgars:   Misty [name] [[id number]]  [address] Misty [name] [[id number]]  4 ,   Misty [name] [[id number]]  [address] [place] [[id number]]  7  Weight:   Misty [name] [[id number]]  1700 g    Misty [name] [[id number]]  1790 g  Magnesium  Sulfate received: Yes: Seizure prophylaxis BMZ received: No Rhophylac:{Rhophylac received:30440032} MMR:{MMR:30440033} T-DaP:{Tdap:23962} Flu: {Qol:76036} RSV Vaccine received: {RSV:31013} Transfusion:{Transfusion received:30440034}  Immunizations received: Immunization History  Administered Date(s) Administered   DTaP 11/10/1990, 01/12/1991, 03/19/1991, 12/29/1991, 01/22/1995   HIB (PRP-T) 11/10/1990, 01/12/1991, 03/19/1991, 12/29/1991   HPV Quadrivalent 06/13/2005, 09/11/2005, 12/13/2005   Hepatitis A, Ped/Adol-2 Dose 05/15/2005, 12/14/2005   Hepatitis B, PED/ADOLESCENT 01/22/1995, 03/27/1995, 08/30/1995, 05/06/2002, 02/02/2003   IPV 11/10/1990, 01/12/1991, 12/29/1991, 01/22/1995   Influenza,  Seasonal, Injecte, Preservative Fre 09/30/2015, 05/21/2023, 06/30/2024   Influenza,inj,Quad PF,6+ Mos 11/30/2013, 09/17/2014, 09/04/2016, 06/26/2017, 05/05/2021, 06/01/2022   MMR 12/29/1991, 01/22/1995   Meningococcal Conjugate 04/18/2005   PPD Test 12/11/2013, 03/19/2014, 10/24/2022, 11/07/2022   Tdap 10/17/2007, 09/28/2020, 05/05/2021    Physical exam  Vitals:   09/12/24  2121 09/12/24 2137 09/12/24 2141 09/12/24 2151  BP: (!) 149/77 (!) 153/82 (!) 143/75 (!) 149/68  Pulse: (!) 105 (!) 118 (!) 121 (!) 106  Resp: 18 18 18 18   Temp:  98.6 F (37 C)    TempSrc:  Oral     General: {Exam; general:21111117} Lochia: {Desc; appropriate/inappropriate:30686::appropriate} Uterine Fundus: {Desc; firm/soft:30687} Incision: {Exam; incision:21111123} DVT Evaluation: {Exam; dvt:2111122} Labs: Lab Results  Component Value Date   WBC 12.1 (H) 09/12/2024   HGB 13.2 09/12/2024   HCT 37.5 09/12/2024   MCV 98.9 09/12/2024   PLT 250 09/12/2024      Latest Ref Rng & Units 09/12/2024    8:54 PM  CMP  Glucose 70 - 99 mg/dL 819   BUN 6 - 20 mg/dL <5   Creatinine 9.55 - 1.00 mg/dL 9.25   Sodium 864 - 854 mmol/L 135   Potassium 3.5 - 5.1 mmol/L 4.1   Chloride 98 - 111 mmol/L 99   CO2 22 - 32 mmol/L 19   Calcium  8.9 - 10.3 mg/dL 9.6   Total Protein 6.5 - 8.1 g/dL 7.5   Total Bilirubin 0.0 - 1.2 mg/dL 0.5   Alkaline Phos 38 - 126 U/L 159   AST 15 - 41 U/L 33   ALT 0 - 44 U/L 21    Edinburgh Score:    10/30/2023    1:18 PM  Edinburgh Postnatal Depression Scale Screening Tool  I have been able to laugh and see the funny side of things. 0   I have looked forward with enjoyment to things. 0   I have blamed myself unnecessarily when things went wrong. 0   I have been anxious or worried for no good reason. 0   I have felt scared or panicky for no good reason. 0   Things have been getting on top of me. 0   I have been so unhappy that I have had difficulty sleeping. 0   I have felt sad or miserable. 0   I have been so unhappy that I have been crying. 0   The thought of harming myself has occurred to me. 0   Edinburgh Postnatal Depression Scale Total 0      Data saved with a previous flowsheet row definition   No data recorded  After visit meds:  Allergies as of 09/12/2024       Reactions   Iodides Anaphylaxis, Itching   Morphine  And Codeine Anaphylaxis,  Itching   Shellfish Allergy Anaphylaxis, Itching   Pt reports watery eyes   Ultram  [tramadol  Hcl] Hives   Hives and swollen lips      Med Rec must be completed prior to using this [facility]***        Discharge home in stable condition Infant Feeding: {Baby feeding:23562} Infant Disposition:{CHL IP OB HOME WITH FNUYZM:76418} Discharge instruction: per After Visit Summary and Postpartum booklet. Activity: Advance as tolerated. Pelvic rest for 6 weeks.  Diet: {OB ipzu:78888878} Future Appointments: Future Appointments  Date Time Provider Department Center  09/15/2024  2:10 PM Rasch, Delon FERNS, NP [facility]  09/16/2024 10:15 AM [facility] PROVIDER 1 [facility]  09/16/2024 10:30 AM [facility] US4 [facility]  09/18/2024  10:55 AM Izell Harari, MD [facility]  09/22/2024  2:50 PM Rasch, Delon FERNS, NP [facility]  09/29/2024  2:50 PM Rasch, Delon FERNS, NP [facility]   Follow up Visit:  Follow-up Information     Center for [facility] Follow up.   Specialty: Obstetrics and Gynecology Contact information: [address] [place]  [address] [phone]                 Please schedule this patient for a In person postpartum visit in 4 weeks with the following provider: MD. Additional Postpartum F/U:BP check 1 week  High risk pregnancy complicated by: GDM, HTN, and preterm labor, severe pre-eclampsia Delivery mode:     Grandberry, Boy A Zainah [[id number]]  Vaginal, Spontaneous    Demetriou, GirlB Jasmaine [[id number]]  Vaginal, Spontaneous Anticipated Birth Control:  {Birth Control:23956}   09/12/2024 Burnard [name] Moats, MD    "

## 2024-09-12 NOTE — Progress Notes (Signed)
 Patient with severe range pressures during delivery, started on hydralazine  protocol postpartum. Ongoing severe range BP, given h/o cHTN and PEC, will pressure superimposed pre-eclampsia with severe features and start on magnesium , 4 g bolus and 2 g /hr.   LOIS Yolanda Moats, MD, [facility] Attending Center for [facility])

## 2024-09-12 NOTE — MAU Note (Addendum)
 MAU Triage Note  Misty [name] is a 34 y.o. at [redacted]w[redacted]d here in MAU reporting:   EMS arrival, reports SROM @12pm , ctx q2-3min; multiple gestation (twins).  Griselda Punter RN, Benton Medley RN, Asberry Keys RN, and Olam Dalton NP @ bedside. Explained to patient the importance of completing a cervical exam as the patient is visibly uncomfortable. Patient needed extensive coaching in order to cooperate with cervical exam and applying FHR monitors. Nurses attempting to doppler FHT while Olam NP attempting to complete a cervical exam. Patient on side, Nurses attempting to coach patient to switch positions in order to get FHT on both babies. Patient uncooperative, FHT 155 on 1 baby. Cervical exam 10/100, breech by Olam Dalton, NP. L&D charge RN notified of pt status; Patient transported to L&D accompanied by Griselda, RN; Asberry, RN & K. Nicholaus, MD.

## 2024-09-12 NOTE — H&P (Addendum)
 "  Obstetric History and Physical  Misty [name] is a 34 y.o. [phone] with di/di twins at [redacted]w[redacted]d presenting for contractions and leaking fluid. On arrival, patient found to be 10/100/+1 and was taken straight to labor and delivery.  Prenatal Course Source of Care: [facility] with onset of care at 13 weeks Pregnancy complications or risks: Patient Active Problem List   Diagnosis Date Noted   Labor and delivery affected by breech presentation 09/12/2024   Normal labor 09/12/2024   Fetal growth restriction antepartum 06/30/2024   Obesity affecting pregnancy, antepartum 06/26/2024   Abnormal MSAFP (maternal serum alpha-fetoprotein), elevated 05/21/2024   Rubella non-immune status, antepartum 05/07/2024   Short interval between pregnancies affecting pregnancy, antepartum 05/05/2024   Supervision of high risk pregnancy, antepartum 04/30/2024   Twin pregnancy, twins dichorionic and diamniotic 04/30/2024   History of maternal syphilis, 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   History of cesarean section 10/28/2023   History of preterm delivery 05/21/2023   Type 2 diabetes mellitus affecting pregnancy in third trimester, antepartum 01/08/2023   History of severe pre-eclampsia 06/17/2021   Marijuana use 05/24/2021   Chronic hypertension affecting pregnancy 09/2013   She plans to breastfeed, plans to bottle feed She desires no method for postpartum contraception.   Prenatal labs and studies: ABO, Rh: --/--/PENDING (01/10 2054) Antibody: PENDING (01/10 2054) Rubella: <0.90 (09/02 1625) RPR: Reactive (12/12 0917)  HBsAg: Negative (09/02 1625)  HIV: Non Reactive (12/12 0917)  HAD:EMZDLFEUPCZ NEGATIVE/-- (02/22 1421) 2 hr GTT:  T2DM Genetic screening normal Anatomy US  normal  Medical History:  Past Medical History:  Diagnosis Date   Abscess 09/02/2020   Allergy    Anemia 2015   Asthma    as child   Chronic hypertension with superimposed preeclampsia 05/24/2021   Diabetes mellitus without  complication (HCC) 2015   DKA (diabetic ketoacidosis) (HCC) 09/02/2020   Eczema    History of pre-eclampsia    Hypertension 09/2013   Trichomonas infection 04/2023   UTI (urinary tract infection)     Past Surgical History:  Procedure Laterality Date   Abcess on back     CESAREAN SECTION N/A 10/27/2023   Procedure: CESAREAN SECTION;  Surgeon: Zina Jerilynn LABOR, MD;  Location: [facility] LD [facility]S;  Service: Obstetrics;  Laterality: N/A;    OB History  Gravida Para Term Preterm AB Living  4 3 1 2 1 4   SAB IAB Ectopic Multiple Live Births  1 0 0 1 4    # Outcome Date GA Lbr Len/2nd Weight Sex Type Anes PTL Lv  4A Preterm 09/12/24 [redacted]w[redacted]d  1700 g M Vag-Spont None  LIV  4B Preterm 09/12/24 [redacted]w[redacted]d  1790 g F Vag-Spont Local  LIV  3 Term 10/27/23 [redacted]w[redacted]d  2640 g F CS-LTranv EPI  LIV  2 SAB 09/05/22          1 Preterm 06/22/21 [redacted]w[redacted]d 14:20 / 00:53 2100 g F Vag-Spont EPI  LIV    Obstetric Comments  2022  BP was high  2025 c/s: fetal heart was dropping, BP was sky high    Social History   Socioeconomic History   Marital status: Single    Spouse name: Not on file   Number of children: Not on file   Years of education: Not on file   Highest education level: Not on file  Occupational History   Not on file  Tobacco Use   Smoking status: Former    Current packs/day: 0.00    Average packs/day: 0.5 packs/day for 2.0  years (1.0 ttl pk-yrs)    Types: Cigarettes    Start date: 09/09/2011    Quit date: 09/08/2013    Years since quitting: 11.0   Smokeless tobacco: Never  Vaping Use   Vaping status: Never Used  Substance and Sexual Activity   Alcohol use: No    Alcohol/week: 0.0 standard drinks of alcohol   Drug use: No   Sexual activity: Yes    Birth control/protection: None    Comment: 1st intercourse 84 yo--1 partner ( has been same partner 3 yrs)   Other Topics Concern   Not on file  Social History Narrative   Works at [employer]   Tobacco Use: Medium Risk  (09/12/2024)   Patient History    Smoking Tobacco Use: Former    Smokeless Tobacco Use: Never    Passive Exposure: Not on Actuary Strain: Not on file  Food Insecurity: No Food Insecurity (09/12/2024)   Epic    Worried About Programme Researcher, Broadcasting/film/video in the Last Year: Never true    Ran Out of Food in the Last Year: Never true  Transportation Needs: No Transportation Needs (09/12/2024)   Epic    Lack of Transportation (Medical): No    Lack of Transportation (Non-Medical): No  Physical Activity: Not on file  Stress: Not on file  Social Connections: Moderately Isolated (09/12/2024)   Social Connection and Isolation Panel    Frequency of Communication with Friends and Family: More than three times a week    Frequency of Social Gatherings with Friends and Family: More than three times a week    Attends Religious Services: Never    Database Administrator or Organizations: No    Attends Banker Meetings: Never    Marital Status: Living with partner  Depression (PHQ2-9): Low Risk (05/05/2024)   Depression (PHQ2-9)    PHQ-2 Score: 0  Alcohol Screen: Not on file  Housing: Low Risk (09/12/2024)   Epic    Unable to Pay for Housing in the Last Year: No    Number of Times Moved in the Last Year: 0    Homeless in the Last Year: No  Utilities: Not At Risk (09/12/2024)   Epic    Threatened with loss of utilities: No  Health Literacy: Not on file    Family History  Problem Relation Age of Onset   Migraines Mother    Hypertension Mother    Heart disease Mother 75       CHF   Diabetes Mother    Healthy Father    Sickle cell anemia Brother    Migraines Maternal Grandmother    Diabetes Maternal Grandmother    Heart disease Maternal Grandmother    Hypertension Maternal Grandfather    Diabetes Maternal Grandfather    Heart disease Maternal Grandfather    Asthma Neg Hx    Cancer Neg Hx     Medications Prior to Admission  Medication Sig Dispense Refill Last Dose/Taking    acetaminophen  (TYLENOL ) 500 MG tablet Take 2 tablets (1,000 mg total) by mouth every 6 (six) hours as needed for mild pain (pain score 1-3). 100 tablet 2    aspirin  EC 81 MG tablet Take 2 tablets (162 mg total) by mouth daily. Take after 12 weeks for prevention of preeclampsia later in pregnancy 300 tablet 2    Blood Glucose Monitoring Suppl DEVI 1 each by Does not apply route in the morning, at noon, and  at bedtime. May substitute to any manufacturer covered by patient's insurance. 1 each 0    Blood Pressure Monitoring (BLOOD PRESSURE MONITOR AUTOMAT) DEVI 1 each by Does not apply route daily. 1 each 0    Continuous Glucose Sensor (DEXCOM G7 SENSOR) MISC Place one monitor onto the skin. Change every ten days 3 each 7    cyclobenzaprine  (FLEXERIL ) 5 MG tablet Take 1-2 tablets (5-10 mg total) by mouth 3 (three) times daily as needed for muscle spasms. 30 tablet 2    Insulin  Glargine (BASAGLAR  KWIKPEN) 100 UNIT/ML 30 units [place] in the morning and 22 units at night 15 mL 3    metoCLOPramide  (REGLAN ) 10 MG tablet Take 1 tablet (10 mg total) by mouth 3 (three) times daily before meals. 90 tablet 1    NIFEdipine  (ADALAT  CC) 60 MG 24 hr tablet Take 1 tablet (60 mg total) by mouth daily. 120 tablet 2    ondansetron  (ZOFRAN -ODT) 4 MG disintegrating tablet Take 1 tablet (4 mg total) by mouth every 8 (eight) hours as needed for nausea or vomiting. 30 tablet 2    Prenatal Vit-Fe Fumarate-FA (WESTAB PLUS) 27-1 MG TABS TAKE 1 TABLET BY MOUTH DAILY 30 tablet 11    scopolamine  (TRANSDERM-SCOP) 1 MG/3DAYS Place 1 patch (1 mg total) onto the skin every 3 (three) days. 10 patch 1     Allergies[1]  Review of Systems: Negative except for what is mentioned in HPI.  Physical Exam: BP (!) 143/75   Pulse (!) 121   Temp 98.6 F (37 C) (Oral)   Resp 18   LMP  (LMP Unknown) Comment: no period since delivery  Breastfeeding Unknown  CONSTITUTIONAL: Well-developed, well-nourished female in active labor  HENT:   Normocephalic, atraumatic, External right and left ear normal. Oropharynx is clear and moist EYES: Conjunctivae and EOM are normal. Pupils are equal, round, and reactive to light. No scleral icterus.  NECK: Normal range of motion, supple, no masses SKIN: Skin is warm and dry. No rash noted. Not diaphoretic. No erythema. No pallor. NEUROLOGIC: Alert and oriented to person, place, and time. Normal reflexes, muscle tone coordination. No cranial nerve deficit noted. PSYCHIATRIC: Normal mood and affect. Normal behavior. Normal judgment and thought content. CARDIOVASCULAR: Normal heart rate noted RESPIRATORY: Effort normal, no problems with respiration noted ABDOMEN: Soft, nontender, nondistended, gravid. MUSCULOSKELETAL: Normal range of motion. No edema and no tenderness.   Cervical Exam:   Dilation: 10     Presentation: cephalic    Pertinent Labs/Studies:   Results for orders placed or performed during the hospital encounter of 09/12/24 (from the past 24 hours)  CBC     Status: Abnormal   Collection Time: 09/12/24  8:54 PM  Result Value Ref Range   WBC 12.1 (H) 4.0 - 10.5 K/uL   RBC 3.79 (L) 3.87 - 5.11 MIL/uL   Hemoglobin 13.2 12.0 - 15.0 g/dL   HCT 62.4 63.9 - 53.9 %   MCV 98.9 80.0 - 100.0 fL   MCH 34.8 (H) 26.0 - 34.0 pg   MCHC 35.2 30.0 - 36.0 g/dL   RDW 86.7 88.4 - 84.4 %   Platelets 250 150 - 400 K/uL   nRBC 0.0 0.0 - 0.2 %  Type and screen [facility]     Status: None (Preliminary result)   Collection Time: 09/12/24  8:54 PM  Result Value Ref Range   ABO/RH(D) PENDING    Antibody Screen PENDING    Sample Expiration  09/15/2024,2359 Performed at [facility], [address] [place], [place] [address]   Comprehensive metabolic panel     Status: Abnormal   Collection Time: 09/12/24  8:54 PM  Result Value Ref Range   Sodium 135 135 - 145 mmol/L   Potassium 4.1 3.5 - 5.1 mmol/L   Chloride 99 98 - 111 mmol/L   CO2 19 (L) 22 - 32 mmol/L    Glucose, Bld 180 (H) 70 - 99 mg/dL   BUN <5 (L) 6 - 20 mg/dL   Creatinine, Ser 9.25 0.44 - 1.00 mg/dL   Calcium  9.6 8.9 - 10.3 mg/dL   Total Protein 7.5 6.5 - 8.1 g/dL   Albumin 3.9 3.5 - 5.0 g/dL   AST 33 15 - 41 U/L   ALT 21 0 - 44 U/L   Alkaline Phosphatase 159 (H) 38 - 126 U/L   Total Bilirubin 0.5 0.0 - 1.2 mg/dL   GFR, Estimated >39 >39 mL/min   Anion gap 16 (H) 5 - 15    Assessment : Senya I Syme is a 34 y.o. H5E8785 at [redacted]w[redacted]d being admitted for active labor and delivered shortly thereafter.   Plan:  Post partum  - Admit to L&D - Analgesia as needed  FWB  - both twins to NICU  cHTN w PEC w SF - IV hydralazine  per protocol for BP management - MgSO4 4 g loading dose then 2 g/hr - am labs  T2DM - cont dexcom - BID 18 units semglee  ordered - diabetic diet  H/o CS - s/o VBAC   Rubella non-immune - needs MMR  FGR - twins in NICU  H/o maternal syphilis - check titers    K. Yolanda Moats, MD, [facility] Attending Center for [facility] (Faculty Practice)  09/12/2024, 9:51 PM        [1]  Allergies Allergen Reactions   Iodides Anaphylaxis and Itching   Morphine  And Codeine Anaphylaxis and Itching   Shellfish Allergy Anaphylaxis and Itching    Pt reports watery eyes   Ultram  [Tramadol  Hcl] Hives    Hives and swollen lips    "

## 2024-09-13 ENCOUNTER — Other Ambulatory Visit: Payer: Self-pay

## 2024-09-13 ENCOUNTER — Encounter (HOSPITAL_COMMUNITY): Payer: Self-pay | Admitting: Obstetrics and Gynecology

## 2024-09-13 LAB — CBC
HCT: 32.5 % — ABNORMAL LOW (ref 36.0–46.0)
Hemoglobin: 11.4 g/dL — ABNORMAL LOW (ref 12.0–15.0)
MCH: 34.1 pg — ABNORMAL HIGH (ref 26.0–34.0)
MCHC: 35.1 g/dL (ref 30.0–36.0)
MCV: 97.3 fL (ref 80.0–100.0)
Platelets: 230 K/uL (ref 150–400)
RBC: 3.34 MIL/uL — ABNORMAL LOW (ref 3.87–5.11)
RDW: 13.1 % (ref 11.5–15.5)
WBC: 12 K/uL — ABNORMAL HIGH (ref 4.0–10.5)
nRBC: 0 % (ref 0.0–0.2)

## 2024-09-13 LAB — PROTEIN / CREATININE RATIO, URINE
Creatinine, Urine: 30 mg/dL
Protein Creatinine Ratio: 1.2 mg/mg — ABNORMAL HIGH
Total Protein, Urine: 35 mg/dL

## 2024-09-13 LAB — SYPHILIS: RPR W/REFLEX TO RPR TITER AND TREPONEMAL ANTIBODIES, TRADITIONAL SCREENING AND DIAGNOSIS ALGORITHM
RPR Ser Ql: REACTIVE — AB
RPR Titer: 1:1 {titer}

## 2024-09-13 LAB — GLUCOSE, CAPILLARY
Glucose-Capillary: 144 mg/dL — ABNORMAL HIGH (ref 70–99)
Glucose-Capillary: 116 mg/dL — ABNORMAL HIGH (ref 70–99)
Glucose-Capillary: 121 mg/dL — ABNORMAL HIGH (ref 70–99)
Glucose-Capillary: 225 mg/dL — ABNORMAL HIGH (ref 70–99)
Glucose-Capillary: 133 mg/dL — ABNORMAL HIGH (ref 70–99)

## 2024-09-13 MED ORDER — NIFEDIPINE ER OSMOTIC RELEASE 30 MG PO TB24
30.0000 mg | ORAL_TABLET | Freq: Every day | ORAL | Status: DC
  Administered 2024-09-13: 30 mg via ORAL
  Filled 2024-09-13: qty 1

## 2024-09-13 MED ORDER — NIFEDIPINE ER OSMOTIC RELEASE 30 MG PO TB24
30.0000 mg | ORAL_TABLET | Freq: Two times a day (BID) | ORAL | Status: DC
  Administered 2024-09-13 – 2024-09-14 (×2): 30 mg via ORAL
  Filled 2024-09-13 (×2): qty 1

## 2024-09-13 NOTE — Progress Notes (Signed)
 POSTPARTUM PROGRESS NOTE  Post Partum Day : 1  Subjective:  Misty [name] is a 34 y.o. H5E8785 s/p SVD x 2 (second twin breech) at [redacted]w[redacted]d.  She reports she is doing well. No acute events overnight. She denies any problems with ambulating, voiding or po intake. Denies nausea or vomiting.  Pain is well controlled.  Lochia is normal.  Pt continues magnesium  sulfate which will be discontinued at 2200.  Objective: Blood pressure 138/69, pulse 92, temperature 98.7 F (37.1 C), temperature source Oral, resp. rate 18, SpO2 99%, unknown if currently breastfeeding.  Physical Exam:  General: alert, cooperative and no distress Chest: no respiratory distress, CTA bilaterally Heart:regular rate and rhythm Abdomen: soft, nontender, nondistended Uterine Fundus: firm, appropriately tender DVT Evaluation: No calf swelling or tenderness Extremities: trace edema Skin: warm, dry  Recent Labs    09/12/24 2054 09/13/24 0549  HGB 13.2 11.4*  HCT 37.5 32.5*   FBS: 144 Assessment/Plan: Misty [name] is a 34 y.o. H5E8785 s/p SVD x 2 at [redacted]w[redacted]d   PPD#1 - Doing well  Routine postpartum care Monitor blood sugars Contraception: postpartum IUD in office Feeding: breast Dispo: continue routine prenatal care   LOS: 1 day   Jerilynn Buddle, MD Faculty attending 09/13/2024, 7:17 AM

## 2024-09-13 NOTE — Lactation Note (Addendum)
 This note was copied from a baby's chart.  NICU Lactation Consultation Note  Patient Name: Misty [name] Today's Date: 09/13/2024 Age:34 hours  Reason for consult: Initial assessment; Preterm <34wks; Infant < 5lbs; Multiple gestation; Maternal endocrine disorder; Other (Comment) (cHTN, Pre-E, Mag) Type of Endocrine Disorder?: Diabetes (T2DM (insulin ))  SUBJECTIVE Visited with family of 89 74/29 weeks old AGA NICU twin female (parents still figuring out the spelling of their names); Misty [name] is a P4 and reported she already initiated pumping this morning with the help of her RN, praised her for all her efforts. She was concerned about her supply as her milk did not come in with her two previous pregnancies for her 34 y.o and her 10 m.o at home. This is her third time in the NICU, all her babies had a NICU stay.  NICU RN Misty [name] asked over Vocera if she would consent to donor milk, [name] voiced that she'll have to talk to FOB prior to signing the consent form. Revised the benefits of using donor milk as a bridge until hers come in.   Reviewed the normalcy patterns at this # of hours post-partum and let he know that we could still try and maybe things will be different this time since she had (+) breast changes during the pregnancy. Reviewed pumping schedule, pumping log, secretory activation, lactogenesis III, CDC and anticipatory guidelines.  OBJECTIVE Infant data: Mother's Current Feeding Choice: -- (NPO)  O2 Device: CPAP FiO2 (%): 21 %  Infant feeding assessment IDFS - Readiness: 4   Maternal data: [id number] Vaginal, Spontaneous Has patient been taught Hand Expression?: Yes Hand Expression Comments: no colostrum noted at this time Significant Breast History:: (+) breast changes during the pregnancy Current breast feeding challenges:: NICU admission Previous breastfeeding challenges?: Low milk supply Does the patient have breastfeeding experience prior to this delivery?: Yes How  long did the patient breastfeed?: She tried but her milk never came in; she pumped for two months Pumping frequency: initiated pumping at 15 hours post-partum Pumped volume: 0 mL Flange Size: 21 Risk factor for low/delayed milk supply:: prematurity, cHTN, Pre-E, Mag, infant separation  Pump: Referral sent for Stork Pump  ASSESSMENT Infant: Feeding Status: NPO  Maternal: Milk volume: Normal  INTERVENTIONS/PLAN Interventions: Interventions: Breast feeding basics reviewed; Breast massage; Hand express; Coconut oil; DEBP; Education; [employer] Services brochure; CDC milk storage guidelines; NICU Pumping Log; CDC Guidelines for Breast Pump Cleaning Tools: Pump; Flanges; Coconut oil Pump Education: Setup, frequency, and cleaning; Milk Storage  Plan: STS once able to Breast massage, hand expression and coconut oil prior to pumping Pump both breasts on initiate mode every 3 hours for 15 minutes, ideally 8 pumping sessions/24 hours Verify Stork pump issuance  No other support person at this time. All questions and concerns answered, family to contact [facility] services PRN.  Consult Status: NICU follow-up NICU Follow-up type: New admission follow up   Coralynn Gaona S Miriam 09/13/2024, 12:25 PM

## 2024-09-13 NOTE — Lactation Note (Signed)
 This note was copied from a baby's chart.  NICU Lactation Consultation Note  Patient Name: Misty [name] Unijb'd Date: 09/13/2024 Age:34 hours  Reason for consult: Preterm <34wks; Infant < 5lbs; Initial assessment; Multiple gestation; Maternal endocrine disorder; Other (Comment) (cHTN, Pre-E, Mag) Type of Endocrine Disorder?: Diabetes (T2DM (insulin ))  SUBJECTIVE Visited with family of 17 76/49 weeks old AGA NICU twin female (parents still figuring out the spelling of their names); [name] is a P4 and reported she already initiated pumping this morning with the help of her RN, praised her for all her efforts. She was concerned about her supply as her milk did not come in with her two previous pregnancies for her 34 y.o and her 10 m.o at home. This is her third time in the NICU, all her babies had a NICU stay.  NICU RN Joesph asked over Vocera if she would consent to donor milk, [name] voiced that she'll have to talk to FOB prior to signing the consent form. Revised the benefits of using donor milk as a bridge until hers come in.   Reviewed the normalcy patterns at this # of hours post-partum and let he know that we could still try and maybe things will be different this time since she had (+) breast changes during the pregnancy. Reviewed pumping schedule, pumping log, secretory activation, lactogenesis III, CDC and anticipatory guidelines.  OBJECTIVE Infant data: Mother's Current Feeding Choice: -- (NPO)  O2 Device: CPAP FiO2 (%): 21 %  Infant feeding assessment IDFS - Readiness: 4   Maternal data: [id number] Vaginal, Spontaneous Has patient been taught Hand Expression?: Yes Hand Expression Comments: no colostrum noted at this time Significant Breast History:: (+) breast changes during the pregnancy Current breast feeding challenges:: NICU admission Previous breastfeeding challenges?: Low milk supply Does the patient have breastfeeding experience prior to this delivery?: Yes How  long did the patient breastfeed?: She tried but her milk never came in; she pumped for two months Pumping frequency: initiated pumping at 15 hours post-partum Pumped volume: 0 mL Flange Size: 21 Risk factor for low/delayed milk supply:: prematurity, cHTN, Pre-E, Mag, infant separation  Pump: Referral sent for Stork Pump  ASSESSMENT Infant: Feeding Status: NPO  Maternal: Milk volume: Normal  INTERVENTIONS/PLAN Interventions: Interventions: Breast feeding basics reviewed; Breast massage; Hand express; Coconut oil; DEBP; Education; [employer] Services brochure; CDC milk storage guidelines; CDC Guidelines for Breast Pump Cleaning; NICU Pumping Log Tools: Pump; Flanges; Coconut oil Pump Education: Setup, frequency, and cleaning; Milk Storage  Plan: STS once able to Breast massage, hand expression and coconut oil prior to pumping Pump both breasts on initiate mode every 3 hours for 15 minutes, ideally 8 pumping sessions/24 hours Verify Stork pump issuance  No other support person at this time. All questions and concerns answered, family to contact [facility] services PRN.  Consult Status: NICU follow-up NICU Follow-up type: New admission follow up   Jes Costales S Miriam 09/13/2024, 12:24 PM

## 2024-09-14 ENCOUNTER — Other Ambulatory Visit (HOSPITAL_COMMUNITY): Payer: Self-pay

## 2024-09-14 LAB — GLUCOSE, CAPILLARY
Glucose-Capillary: 102 mg/dL — ABNORMAL HIGH (ref 70–99)
Glucose-Capillary: 103 mg/dL — ABNORMAL HIGH (ref 70–99)

## 2024-09-14 LAB — T.PALLIDUM AB, TOTAL: T Pallidum Abs: REACTIVE — AB

## 2024-09-14 MED ORDER — METFORMIN HCL 500 MG PO TABS
500.0000 mg | ORAL_TABLET | Freq: Two times a day (BID) | ORAL | 0 refills | Status: AC
  Filled 2024-09-14: qty 60, 30d supply, fill #0

## 2024-09-14 MED ORDER — METFORMIN HCL 500 MG PO TABS: 500.0000 mg | ORAL_TABLET | Freq: Two times a day (BID) | ORAL | Status: DC

## 2024-09-14 MED ORDER — INSULIN GLARGINE-YFGN 100 UNIT/ML ~~LOC~~ SOPN
18.0000 [IU] | PEN_INJECTOR | Freq: Two times a day (BID) | SUBCUTANEOUS | 11 refills | Status: AC
  Filled 2024-09-14: qty 9, 25d supply, fill #0

## 2024-09-14 MED ORDER — POTASSIUM CHLORIDE CRYS ER 20 MEQ PO TBCR
20.0000 meq | EXTENDED_RELEASE_TABLET | Freq: Every day | ORAL | Status: DC
  Administered 2024-09-14: 20 meq via ORAL
  Filled 2024-09-14: qty 1

## 2024-09-14 MED ORDER — ACETAMINOPHEN 325 MG PO TABS
650.0000 mg | ORAL_TABLET | ORAL | 0 refills | Status: AC | PRN
  Filled 2024-09-14: qty 30, 3d supply, fill #0

## 2024-09-14 MED ORDER — MEASLES, MUMPS & RUBELLA VAC ~~LOC~~ SUSR: 0.5000 mL | Freq: Once | SUBCUTANEOUS | Status: DC

## 2024-09-14 MED ORDER — IBUPROFEN 600 MG PO TABS
600.0000 mg | ORAL_TABLET | Freq: Four times a day (QID) | ORAL | 0 refills | Status: AC
  Filled 2024-09-14: qty 30, 8d supply, fill #0

## 2024-09-14 MED ORDER — NIFEDIPINE ER 30 MG PO TB24
30.0000 mg | ORAL_TABLET | Freq: Two times a day (BID) | ORAL | 0 refills | Status: AC
  Filled 2024-09-14: qty 60, 30d supply, fill #0

## 2024-09-14 MED ORDER — FUROSEMIDE 20 MG PO TABS
20.0000 mg | ORAL_TABLET | Freq: Every day | ORAL | 0 refills | Status: AC
  Filled 2024-09-14: qty 5, 5d supply, fill #0

## 2024-09-14 MED ORDER — POTASSIUM CHLORIDE CRYS ER 20 MEQ PO TBCR
20.0000 meq | EXTENDED_RELEASE_TABLET | Freq: Every day | ORAL | 0 refills | Status: AC
  Filled 2024-09-14: qty 5, 5d supply, fill #0

## 2024-09-14 MED ORDER — FUROSEMIDE 20 MG PO TABS
20.0000 mg | ORAL_TABLET | Freq: Every day | ORAL | Status: DC
  Administered 2024-09-14: 20 mg via ORAL
  Filled 2024-09-14: qty 1

## 2024-09-14 NOTE — Inpatient Diabetes Management (Signed)
 Patient being discharged home today. Briefly verified insulin  doses for discharge and discussed glucose goals 100-150 mg/dL (not as tight now that babies delivered).  She plans to f/u with DM doctor at discharge.  She also plans to restart Dexcom sensor once she gets home.  Encouraged close monitoring for hypoglycemia as well.  Patient appreciative of information.   Thanks,  Randall Bullocks, RN, BC-ADM Inpatient Diabetes Coordinator Pager [phone]  (8a-5p)

## 2024-09-14 NOTE — Lactation Note (Signed)
 This note was copied from [name] baby's chart.  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  Both babies are on CPAP and receiving DBM by gavage.    Mom to be discharged today.  LC discovered that Mom has pumped one breast at [name] time and inconsistently.  LC provided Mom with [name] pumping band (brown) and assisted her to pump.  LC also resized Mom with 18 mm flanges and instructed her to order smaller flanges for her Spectra  S2.  Mom also has WIC, LC sent [name] referral due to having preterm twins and the benefit of [name] hospital grade pump like the Medela Symphony to support her milk supply.  LC reviewed importance of washing the disassembled pump parts, provided 2 basins for washing and drying of pump parts.  Mom to take all the pumping supplies to NICU to use the pump the room.   Plan- 1- STS with babies during care times 2- Massage breasts and hand express often 3- Pump both breasts, massaging during pumping to help stimulate milk supply 4- ask for LC prn  OBJECTIVE Infant data: Mother's Current Feeding Choice: Breast Milk and Donor Milk  O2 Device: CPAP FiO2 (%): 21 %  Infant feeding assessment IDFS - Readiness: 3   Maternal data: [id number] Vaginal, Spontaneous Has patient been taught Hand Expression?: Yes Hand Expression Comments: no colostrum noted at this time Significant Breast History:: (+) breast changes during the pregnancy Current breast feeding challenges:: NICU admission Previous breastfeeding challenges?: Low milk  supply Does the patient have breastfeeding experience prior to this delivery?: Yes How long did the patient breastfeed?: She tried but her milk never came in; she pumped for two months Pumping frequency: Mom had been pumping one breast at [name] time and was inconsistent, encouraged bilateral pumping every 3 hrs Pumped volume: 0 mL Flange Size: 18 Hands-free pumping top sizes: Large Misty [name]) Risk factor for low/delayed milk supply:: prematurity, cHTN, Pre-E, Mag, infant separation  WIC Program: Yes WIC Referral Sent?: Yes What county?: [place] Pump: Received Stork Pump  ASSESSMENT Infant:  \Feeding Status: Scheduled 8-11-2-5 Feeding method: Tube/Gavage (Bolus)  Maternal: Milk volume: Normal  INTERVENTIONS/PLAN Interventions: Interventions: Skin to skin; Breast massage; Hand express; DEBP; Education Discharge Education: Engorgement and breast care Tools: Pump; Flanges; Hands-free pumping top Pump Education: Setup, frequency, and cleaning; Milk Storage  Plan: Consult Status: NICU follow-up NICU Follow-up type: Verify onset of copious milk; Verify absence of engorgement   Misty [name] 09/14/2024, 2:07 PM

## 2024-09-14 NOTE — Clinical Social Work Maternal (Signed)
 " CLINICAL SOCIAL WORK MATERNAL/CHILD NOTE  Patient Details  Name: Misty [name] MRN: [id number] Date of Birth: 07-21-1991  Date:  09/14/2024  Clinical Social Worker Initiating Note:  [place]zen [place], [place] Date/Time: Initiated:  09/14/24/1221     Child's Name:  Misty [name]: Misty [name] [name]: Misty [name]   Biological Parents:  Mother, Father (Father: Misty [name] 06/11/1984)   Need for Interpreter:  None   Reason for Referral:  Parental Support of Premature Babies < 32 weeks/or Critically Ill babies, Current Substance Use/Substance Use During Pregnancy     Address:  [address] [place] [address]    Phone number:  [phone] (home)     Additional phone number:   Household Members/Support Persons (HM/SP):   Household Member/Support Person 1, Household Member/Support Person 2, Household Member/Support Person 3   HM/SP Name Relationship DOB or Age  HM/SP -1 Misty [name] FOB 06/11/84  HM/SP -2 Misty [name] daughter 06/22/21  HM/SP -3 Misty [name] daughter 10/27/23  HM/SP -4        HM/SP -5        HM/SP -6        HM/SP -7        HM/SP -8          Natural Supports (not living in the home):  Teacher, Music Supports: None   Employment: Unemployed   Type of Work:     Education:  Engineer, agricultural   Homebound arranged:    Surveyor, Quantity Resources:  Medicaid   Other Resources:  Sales Executive  , WIC   Cultural/Religious Considerations Which May Impact Care:    Strengths:  Ability to meet basic needs  , Home prepared for child  , Understanding of illness, Pediatrician chosen   Psychotropic Medications:         Pediatrician:    [employer] area  Pediatrician List:   [facility]  [place]    [place]    [place] [facility]      Pediatrician Fax Number:    Risk Factors/Current Problems:  Substance Use     Cognitive State:  Able to Concentrate  , Alert  , Linear Thinking  , Goal  Oriented     Mood/Affect:  Calm  , Comfortable  , Happy  , Interested  , Relaxed     CSW Assessment: CSW met with [name] at bedside to complete psychosocial assessment. CSW introduced self and explained role. [name] recalled meeting CSW in 2022 when she had her first child. [name] provided an update on her oldest child. [name] was welcoming, pleasant, and remained engaged during assessment. [name] reported that she resides with FOB and two older children. [name] reported having items needed to care for infant including 2 car seats that she is scheduled to pick up on 09/16/24. [name] reported that she needs assistance with pack n plays and a diaper bag. CSW agreed to complete a family support network elizabeth's closet referral, [name] was interested and agreeable to referral. CSW inquired about [name]'s support system, [name] reported that her children's teachers are supports and explained that it is a home based program. [name] reported that she is also connected with [facility] [employer] and has a [facility] case worker.   CSW inquired about [name]'s mental health history. [name] denied any mental health history. [name] endorsed experiencing postpartum depression after having her first child, noting she was tearful. CSW inquired about how [name] was  feeling emotionally since giving birth, [name] initially reported not feeling. CSW asked was [name] feeling numb, [name] reported no. [name] explained that she is feeling kind of happy as she finally has a boy. [name] could not attribute the kind of happy feeling to anything specific. [name] presented calm and did not demonstrate any acute mental health signs/symptoms. CSW assessed for safety, [name] denied SI, HI, and domestic violence.   CSW provided education regarding the baby blues period vs. perinatal mood disorders, discussed treatment and gave resources for mental health follow up if concerns arise.  CSW recommends self-evaluation during the postpartum time period using the New Mom Checklist from Postpartum Progress and  encouraged [name] to contact a medical professional if symptoms are noted at any time.    CSW provided review of Sudden Infant Death Syndrome (SIDS) precautions.    CSW and [name] discussed infants NICU admission. CSW informed [name] about the NICU, what to expect, and resources/supports available while infants are admitted to the NICU. [name] reported that this is her third NICU admission and she feels well informed about infant's care. [name] denied any transportation barriers with visiting infant in the NICU. [name] shared that FOB will be staying in the NICU and she will visit when she has childcare. [name] denied any questions/concerns regarding the NICU. [name] requested meal vouchers, CSW agreed to place at infant's bedside.   CSW informed [name] about the hospital drug screen policy due to documented marijuana use during pregnancy. [name] confirmed marijuana use and reported last use as three months ago. [name] denied any additional substance use. CSW informed [name] that infants UDS were negative and CDS would continue to be monitored and a CPS report would be made if warranted. [name] verbalized understanding and denied any questions. [name] denied any CPS history.   CSW will complete FSN referral for requested items. CSW will place meal vouchers at infant's bedside.   CSW will continue to offer resources/supports while infant is admitted to the NICU as [name] opted for CSW to check in weekly.    CSW Plan/Description:  Sudden Infant Death Syndrome (SIDS) Education, Perinatal Mood and Anxiety Disorder (PMADs) Education, Psychosocial Support and Ongoing Assessment of Needs, Other Information/Referral to [facility], Hospital Drug Screen Policy Information, CSW Will Continue to Monitor Umbilical Cord Tissue Drug Screen Results and Make Report if Misty Suzen LITTIE Darra, LCSW 09/14/2024, 12:25 PM  "

## 2024-09-14 NOTE — Plan of Care (Signed)
" °  Problem: Health Behavior/Discharge Planning: Goal: Ability to manage health-related needs will improve Outcome: Progressing   Problem: Clinical Measurements: Goal: Ability to maintain clinical measurements within normal limits will improve Outcome: Progressing Goal: Will remain free from infection Outcome: Progressing Goal: Diagnostic test results will improve Outcome: Progressing Goal: Cardiovascular complication will be avoided Outcome: Progressing   Problem: Activity: Goal: Risk for activity intolerance will decrease Outcome: Progressing   Problem: Fluid Volume: Goal: Peripheral tissue perfusion will improve Outcome: Progressing   Problem: Clinical Measurements: Goal: Complications related to disease process, condition or treatment will be avoided or minimized Outcome: Progressing   Problem: Fluid Volume: Goal: Ability to maintain a balanced intake and output will improve Outcome: Progressing   Problem: Metabolic: Goal: Ability to maintain appropriate glucose levels will improve Outcome: Progressing   Problem: Education: Goal: Knowledge of condition will improve Outcome: Progressing   "

## 2024-09-14 NOTE — Plan of Care (Signed)
Patient to be discharged home with printed instructions. Toya Smothers, RN

## 2024-09-15 ENCOUNTER — Encounter: Admitting: Obstetrics and Gynecology

## 2024-09-15 ENCOUNTER — Ambulatory Visit: Payer: Self-pay | Admitting: Obstetrics and Gynecology

## 2024-09-15 ENCOUNTER — Telehealth: Payer: Self-pay | Admitting: Obstetrics and Gynecology

## 2024-09-15 NOTE — Telephone Encounter (Signed)
 Called patient to review labs and delivery. Confirmed via two identifiers. She is doing well, is having some pain. Reviewed alternating tylenol  and ibuprofen . Reviewed delivery as well, as patient came in with precipitous delivery. She has no questions about delivery. States twins are doing well and she is ready for them to come home though understands they will need to be in NICU a while longer. Reviewed BP check and to call/message iwht any issues. Answered all questions.  LOIS Yolanda Moats, MD, [facility] Attending Center for [employer] [facility])

## 2024-09-16 ENCOUNTER — Ambulatory Visit

## 2024-09-16 ENCOUNTER — Other Ambulatory Visit

## 2024-09-16 LAB — SURGICAL PATHOLOGY

## 2024-09-18 ENCOUNTER — Encounter: Payer: Self-pay | Admitting: Obstetrics and Gynecology

## 2024-09-19 ENCOUNTER — Ambulatory Visit (HOSPITAL_COMMUNITY)
Admission: EM | Admit: 2024-09-19 | Discharge: 2024-09-19 | Disposition: A | Discharge status: Home / Self Care | Attending: Emergency Medicine | Admitting: Emergency Medicine

## 2024-09-19 ENCOUNTER — Encounter (HOSPITAL_COMMUNITY): Payer: Self-pay | Admitting: *Deleted

## 2024-09-19 DIAGNOSIS — I10 Essential (primary) hypertension: Secondary | ICD-10-CM

## 2024-09-19 DIAGNOSIS — J101 Influenza due to other identified influenza virus with other respiratory manifestations: Secondary | ICD-10-CM

## 2024-09-19 LAB — POCT INFLUENZA A/B
Influenza A, POC: POSITIVE — AB
Influenza B, POC: NEGATIVE

## 2024-09-19 MED ORDER — OSELTAMIVIR PHOSPHATE 75 MG PO CAPS: 75.0000 mg | ORAL_CAPSULE | Freq: Two times a day (BID) | ORAL | 0 refills | Status: AC

## 2024-09-19 NOTE — ED Triage Notes (Addendum)
 Pt states cough and congestion X 2 days. She is taking cough meds. She did just have twins taht are still in NICU.

## 2024-09-19 NOTE — ED Provider Notes (Signed)
 " [facility]    CSN: [id number] Arrival date & time: 09/19/24  9074      History   Chief Complaint Chief Complaint  Patient presents with   Cough   Nasal Congestion    HPI Misty [name] is a 34 y.o. female.   Patient presents to clinic with her daughters and her mother over concern of coughing for the past 2 days.  She did get her flu vaccine this year.  She gave birth 7 days ago to premature twins that are currently in the NICU.  Patient has not had any fever.  Reports she is on a medication to increase her appetite so this has been normal.  Has not had medications or interventions.  Her 2 daughters the present with her in clinic are sick with similar symptoms.  Family was recently exposed to the flu.  Reports her blood pressure has been elevated and she has been on medication for this, has not taken her blood pressure medication yet today.  The history is provided by the patient and medical records.  Cough   Past Medical History:  Diagnosis Date   Abscess 09/02/2020   Allergy    Anemia 2015   Asthma    as child   Chronic hypertension with superimposed preeclampsia 05/24/2021   Diabetes mellitus without complication (HCC) 2015   DKA (diabetic ketoacidosis) (HCC) 09/02/2020   Eczema    History of pre-eclampsia    Hypertension 09/2013   Trichomonas infection 04/2023   UTI (urinary tract infection)     Patient Active Problem List   Diagnosis Date Noted   Labor and delivery affected by breech presentation 09/12/2024   Normal labor 09/12/2024   Fetal growth restriction antepartum 06/30/2024   Obesity affecting pregnancy, antepartum 06/26/2024   Abnormal MSAFP (maternal serum alpha-fetoprotein), elevated 05/21/2024   Rubella non-immune status, antepartum 05/07/2024   Short interval between pregnancies affecting pregnancy, antepartum 05/05/2024   Supervision of high risk pregnancy, antepartum 04/30/2024   Twin pregnancy, twins dichorionic and diamniotic  04/30/2024   History of maternal syphilis, 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   History of cesarean section 10/28/2023   History of preterm delivery 05/21/2023   Type 2 diabetes mellitus affecting pregnancy in third trimester, antepartum 01/08/2023   Chronic hypertension with superimposed preeclampsia 06/22/2021   History of severe pre-eclampsia 06/17/2021   Marijuana use 05/24/2021   Chronic hypertension affecting pregnancy 09/2013    Past Surgical History:  Procedure Laterality Date   Abcess on back     CESAREAN SECTION N/A 10/27/2023   Procedure: CESAREAN SECTION;  Surgeon: Zina Jerilynn LABOR, MD;  Location: [facility];  Service: Obstetrics;  Laterality: N/A;    OB History     Gravida  4   Para  3   Term  1   Preterm  2   AB  1   Living  4      SAB  1   IAB  0   Ectopic  0   Multiple  1   Live Births  4        Obstetric Comments  2022  BP was high 2025 c/s: fetal heart was dropping, BP was sky high          Home Medications    Prior to Admission medications  Medication Sig Start Date End Date Taking? Authorizing Provider  furosemide  (LASIX ) 20 MG tablet Take 1 tablet (20 mg total) by mouth daily. 09/14/24  Yes Cleatus Moccasin, MD  insulin  glargine-yfgn (SEMGLEE ) 100 UNIT/ML Pen Inject 18 Units into the skin 2 (two) times daily. 09/14/24  Yes Cleatus Moccasin, MD  metFORMIN  (GLUCOPHAGE ) 500 MG tablet Take 1 tablet (500 mg total) by mouth 2 (two) times daily with a meal. 09/14/24  Yes Cleatus Moccasin, MD  NIFEdipine  (ADALAT  CC) 30 MG 24 hr tablet Take 1 tablet (30 mg total) by mouth 2 (two) times daily. 09/14/24  Yes Cleatus Moccasin, MD  oseltamivir  (TAMIFLU ) 75 MG capsule Take 1 capsule (75 mg total) by mouth every 12 (twelve) hours. 09/19/24  Yes Ball, Kaitlynd Phillips  G, FNP  potassium chloride  SA (KLOR-CON  M) 20 MEQ tablet Take 1 tablet (20 mEq total) by mouth daily. 09/14/24  Yes Cleatus Moccasin, MD  acetaminophen  (TYLENOL ) 325 MG tablet Take 2 tablets (650 mg total) by mouth  every 4 (four) hours as needed (for pain scale < 4). 09/14/24   Cleatus Moccasin, MD  Blood Glucose Monitoring Suppl DEVI 1 each by Does not apply route in the morning, at noon, and at bedtime. May substitute to any manufacturer covered by patient's insurance. 04/25/24   Jhonny Augustin BROCKS, MD  Blood Pressure Monitoring (BLOOD PRESSURE MONITOR AUTOMAT) DEVI 1 each by Does not apply route daily. 04/25/24   Jhonny Augustin BROCKS, MD  Continuous Glucose Sensor (DEXCOM G7 SENSOR) MISC Place one monitor onto the skin. Change every ten days 05/05/24   Delores Nidia CROME, FNP  ibuprofen  (ADVIL ) 600 MG tablet Take 1 tablet (600 mg total) by mouth every 6 (six) hours. 09/14/24   Cleatus Moccasin, MD  Prenatal Vit-Fe Fumarate-FA (WESTAB PLUS) 27-1 MG TABS TAKE 1 TABLET BY MOUTH DAILY 04/27/24   Jhonny Augustin BROCKS, MD    Family History Family History  Problem Relation Age of Onset   Migraines Mother    Hypertension Mother    Heart disease Mother 53       CHF   Diabetes Mother    Healthy Father    Sickle cell anemia Brother    Migraines Maternal Grandmother    Diabetes Maternal Grandmother    Heart disease Maternal Grandmother    Hypertension Maternal Grandfather    Diabetes Maternal Grandfather    Heart disease Maternal Grandfather    Asthma Neg Hx    Cancer Neg Hx     Social History Social History[1]   Allergies   Iodides, Morphine  and codeine, Shellfish allergy, and Ultram  [tramadol  hcl]   Review of Systems Review of Systems  Per HPI  Physical Exam Triage Vital Signs ED Triage Vitals [09/19/24 1022]  Encounter Vitals Group     BP (!) 144/101     Girls Systolic BP Percentile      Girls Diastolic BP Percentile      Boys Systolic BP Percentile      Boys Diastolic BP Percentile      Pulse Rate (!) 108     Resp 16     Temp 99.6 F (37.6 C)     Temp Source Oral     SpO2 98 %     Weight      Height      Head Circumference      Peak Flow      Pain Score 0     Pain Loc      Pain Education       Exclude from Growth Chart    No data found.  Updated Vital Signs BP (!) 144/101 (BP Location: Right Arm)   Pulse (!) 108  Temp 99.6 F (37.6 C) (Oral)   Resp 16   LMP  (LMP Unknown) Comment: no period since delivery  SpO2 98%   Breastfeeding Yes   Visual Acuity Right Eye Distance:   Left Eye Distance:   Bilateral Distance:    Right Eye Near:   Left Eye Near:    Bilateral Near:     Physical Exam Vitals and nursing note reviewed.  Constitutional:      Appearance: Normal appearance.  HENT:     Head: Normocephalic and atraumatic.     Right Ear: External ear normal.     Left Ear: External ear normal.     Nose: Congestion present.     Mouth/Throat:     Mouth: Mucous membranes are moist.     Pharynx: Posterior oropharyngeal erythema present.  Eyes:     Conjunctiva/sclera: Conjunctivae normal.  Cardiovascular:     Rate and Rhythm: Normal rate and regular rhythm.     Heart sounds: Normal heart sounds. No murmur heard. Pulmonary:     Effort: Pulmonary effort is normal. No respiratory distress.     Breath sounds: Normal breath sounds. No wheezing.  Skin:    General: Skin is warm and dry.  Neurological:     General: No focal deficit present.     Mental Status: She is alert.  Psychiatric:        Mood and Affect: Mood normal.      UC Treatments / Results  Labs (all labs ordered are listed, but only abnormal results are displayed) Labs Reviewed  POCT INFLUENZA A/B - Abnormal; Notable for the following components:      Result Value   Influenza A, POC Positive (*)    All other components within normal limits    EKG   Radiology No results found.  Procedures Procedures (including critical care time)  Medications Ordered in UC Medications - No data to display  Initial Impression / Assessment and Plan / UC Course  I have reviewed the triage vital signs and the nursing notes.  Pertinent labs & imaging results that were available during my care of the patient  were reviewed by me and considered in my medical decision making (see chart for details).  Vitals and triage reviewed, patient is hemodynamically stable.  Lungs vesicular, heart with regular rate and rhythm.  Congestion and posterior pharynx erythema present.  Positive for influenza A in clinic.  Started on Tamiflu , side effects discussed.  Blood pressure elevated in clinic, encouraged compliance with antihypertensives and proper follow-up.  Encouraged wearing mask around her NICU children and discussing her influenza A status with their provider.  Symptomatic management for viral illness discussed.  Plan of care, follow-up care, and return precautions given, no questions at this time.    Final Clinical Impressions(s) / UC Diagnoses   Final diagnoses:  Influenza A  Essential hypertension     Discharge Instructions      You tested positive for influenza A, viral illness.  Take the Tamiflu  twice daily for the next 5 days.  Side effects may include abdominal pain, nausea and vomiting.  If this happens you can stop the Tamiflu .  Alternate Tylenol  and ibuprofen  every 4-6 hours to help with fever, body aches and chills.  Tell the provider taking care of your children in the NICU that you tested positive for influenza.  Wear your mask at all times to help avoid the spread to your children.  Typical viral illnesses last 5 to 7 days.  Seek follow-up care for any new concerning symptoms or prolonged symptoms.  Follow-up with your primary care provider regarding blood pressure management.      ED Prescriptions     Medication Sig Dispense Auth. Provider   oseltamivir  (TAMIFLU ) 75 MG capsule Take 1 capsule (75 mg total) by mouth every 12 (twelve) hours. 10 capsule Ball, Dionte Blaustein  G, FNP      PDMP not reviewed this encounter.     [1]  Social History Tobacco Use   Smoking status: Former    Current packs/day: 0.00    Average packs/day: 0.5 packs/day for 2.0 years (1.0 ttl pk-yrs)     Types: Cigarettes    Start date: 09/09/2011    Quit date: 09/08/2013    Years since quitting: 11.0   Smokeless tobacco: Never  Vaping Use   Vaping status: Never Used  Substance Use Topics   Alcohol use: No    Alcohol/week: 0.0 standard drinks of alcohol   Drug use: No     Mercer Paris MATSU, FNP 09/19/24 1103  "

## 2024-09-19 NOTE — Discharge Instructions (Signed)
 You tested positive for influenza A, viral illness.  Take the Tamiflu  twice daily for the next 5 days.  Side effects may include abdominal pain, nausea and vomiting.  If this happens you can stop the Tamiflu .  Alternate Tylenol  and ibuprofen  every 4-6 hours to help with fever, body aches and chills.  Tell the provider taking care of your children in the NICU that you tested positive for influenza.  Wear your mask at all times to help avoid the spread to your children.  Typical viral illnesses last 5 to 7 days.  Seek follow-up care for any new concerning symptoms or prolonged symptoms.  Follow-up with your primary care provider regarding blood pressure management.

## 2024-09-22 ENCOUNTER — Encounter: Admitting: Obstetrics and Gynecology

## 2024-09-23 ENCOUNTER — Telehealth (HOSPITAL_COMMUNITY): Payer: Self-pay | Admitting: *Deleted

## 2024-09-23 ENCOUNTER — Telehealth: Payer: Self-pay | Admitting: Family Medicine

## 2024-09-23 ENCOUNTER — Ambulatory Visit: Payer: Self-pay

## 2024-09-23 DIAGNOSIS — O219 Vomiting of pregnancy, unspecified: Secondary | ICD-10-CM

## 2024-09-23 MED ORDER — PROMETHAZINE HCL 25 MG PO TABS: 25.0000 mg | ORAL_TABLET | Freq: Four times a day (QID) | ORAL | 0 refills | Status: AC | PRN

## 2024-09-23 NOTE — Telephone Encounter (Signed)
 09/23/2024  Name: Misty [name] MRN: [id number] DOB: [birth year]  Reason for Call:  Transition of Care Hospital Discharge Call  Contact Status: Patient Contact Status: Complete  Language assistant needed:          Follow-Up Questions: Do You Have Any Concerns About Your Health As You Heal From Delivery?: No Do You Have Any Concerns About Your Infants Health?: Infant in NICU  Edinburgh Postnatal Depression Scale:  In the Past 7 Days:  Patient reported that her answers are the same as when she completed the EPDS in the hospital on 09/14/24. Score at that time was 0. Stated that she is doing well. EPDS not completed at this time  PHQ2-9 Depression Scale:     Discharge Follow-up:  Informed patient about postpartum classes and support groups. Patient declined email information at this time.  Post-discharge interventions: NA  Signature Allean IVAR Carton, RN, 09/23/24, [phone]

## 2024-09-23 NOTE — Addendum Note (Signed)
 Addended by: INOCENTE ELENOR CROME on: 09/23/2024 11:30 AM   Modules accepted: Orders

## 2024-09-23 NOTE — Telephone Encounter (Signed)
 Patient called to reschedule bp check that was scheduled for today. She says that she has the flu and is still coughing really badly. She has taken all of the medicine she was prescribed and wants to know if the doctor can send her in some promethazine .

## 2024-09-29 ENCOUNTER — Encounter: Admitting: Obstetrics and Gynecology

## 2024-09-29 NOTE — Lactation Note (Signed)
 This note was copied from a baby's chart.  NICU Lactation Consultation Note  Patient Name: Misty [name] Date: 09/29/2024 Age:34 wk.o.  Reason for consult: Weekly NICU follow-up; NICU baby; Multiple gestation; Late-preterm 34-36.6wks; Infant < 5lbs; Maternal endocrine disorder; Other (Comment) (cHTN, Pre-E, maternal THC use) Type of Endocrine Disorder?: Diabetes (T2DM (insulin ))  SUBJECTIVE Visited with family of 60 17/45 weeks old AGA NICU twin female Misty [name]; Misty [name] is a P4 and reported she's still pumping but unsure of frequency; she said it's daily. Denies any S/S of engorgement at this time despite inconsistent pumping. No breastmilk feeding documented in babies' charts since births; they transitioned from donor milk to Similac 24 calorie formula.   She request more coconut oil and small colostrum containers, they were provided. Re-educated about the importance of consistent pumping for the prevention of engorgement and to protect her supply. She voiced she has her [facility] appt for next week to P/U her Symphony breast pump; she's been using her Spectra  at home.  OBJECTIVE Infant data: Mother's Current Feeding Choice: Breast Milk and Formula  O2 Device: Room Air  Infant feeding assessment IDFS - Readiness: 2 IDFS - Quality: 3   Maternal data: [id number] Vaginal, Spontaneous Pumping frequency: Unsure; she voiced every 3 hours but hasn't brought any breastmilk since babies were born Pumped volume: 30 mL  WIC Program: Yes WIC Referral Sent?: Yes What county?: [place] Pump: Received [employer]ork Pump  ASSESSMENT Infant: Feeding Status: Scheduled 8-11-2-5 Feeding method: Bottle; Tube/Gavage (Bolus) Nipple Type: Dr. Jonna Fling Preemie  Maternal: Milk volume: Low  INTERVENTIONS/PLAN Interventions: Interventions: Breast feeding basics reviewed; Coconut oil; DEBP; Education Tools: Coconut oil  Plan: STS around care times Pump both breasts on maintain mode every 3  hours for 20-30 minutes, ideally 8 pumping sessions/24 hours   No other support person at this time. All questions and concerns answered, family to contact [facility] services PRN.   Consult Status: NICU follow-up NICU Follow-up type: Weekly NICU follow up   Neliah Cuyler S Miriam 09/29/2024, 2:25 PM

## 2024-09-29 NOTE — Lactation Note (Signed)
 This note was copied from a baby's chart.  NICU Lactation Consultation Note  Patient Name: Misty [name] Unijb'd Date: 09/29/2024 Age:34 wk.o.  Reason for consult: NICU baby; Infant < 5lbs; Multiple gestation; Late-preterm 34-36.6wks; Weekly NICU follow-up; Maternal endocrine disorder; Other (Comment) (cHTN, Pre-E, maternal THC use) Type of Endocrine Disorder?: Diabetes (T2DM (insulin ))  SUBJECTIVE  Visited with family of 17 1/79 weeks old AGA NICU twin female Khalan; Ms. Dix is a P4 and reported she's still pumping but unsure of frequency; she said it's daily. Denies any S/S of engorgement at this time despite inconsistent pumping. No breastmilk feedings documented in babies' charts since birth; they transitioned from donor milk to Similac 24 calorie formula.   She request more coconut oil and small colostrum containers, they were provided. Re-educated about the importance of consistent pumping for the prevention of engorgement and to protect her supply. She voiced she has her [facility] appt for next week to P/U her Symphony breast pump; she's been using her Spectra  at home.  OBJECTIVE Infant data: Mother's Current Feeding Choice: Breast Milk and Formula  O2 Device: Room Air  Infant feeding assessment IDFS - Readiness: 2 IDFS - Quality: 3   Maternal data: [id number] Vaginal, Spontaneous Pumping frequency: Unsure; she voiced every 3 hours but hasn't brought any breastmilk since babies were born Pumped volume: 30 mL  WIC Program: Yes WIC Referral Sent?: Yes What county?: [place] Pump: Received [employer]ork Pump  ASSESSMENT Infant: Feeding Status: Scheduled 9-12-3-6 Feeding method: Bottle; Tube/Gavage (Bolus) Nipple Type: Nfant Slow Flow (purple)  Maternal: Milk volume: Low  INTERVENTIONS/PLAN Interventions: Interventions: Breast feeding basics reviewed; Coconut oil; DEBP; Education Tools: Coconut oil  Plan: STS around care times Pump both breasts on maintain mode  every 3 hours for 20-30 minutes, ideally 8 pumping sessions/24 hours   No other support person at this time. All questions and concerns answered, family to contact [facility] services PRN.   Consult Status: NICU follow-up NICU Follow-up type: Weekly NICU follow up   Mechelle Pates S Miriam 09/29/2024, 2:25 PM

## 2024-09-30 ENCOUNTER — Telehealth (INDEPENDENT_AMBULATORY_CARE_PROVIDER_SITE_OTHER): Payer: Self-pay | Admitting: Primary Care

## 2024-09-30 NOTE — Telephone Encounter (Signed)
 Left VM with pt about their upcoming appt. Pt did not answer

## 2024-10-01 ENCOUNTER — Inpatient Hospital Stay (INDEPENDENT_AMBULATORY_CARE_PROVIDER_SITE_OTHER): Payer: Self-pay | Admitting: Primary Care

## 2024-10-02 ENCOUNTER — Ambulatory Visit

## 2024-10-08 ENCOUNTER — Ambulatory Visit

## 2024-10-14 ENCOUNTER — Ambulatory Visit: Admitting: Family Medicine

## 2024-10-22 ENCOUNTER — Inpatient Hospital Stay (INDEPENDENT_AMBULATORY_CARE_PROVIDER_SITE_OTHER): Admitting: Primary Care

## 2024-11-19 ENCOUNTER — Inpatient Hospital Stay (HOSPITAL_COMMUNITY): Admit: 2024-11-19
